# Patient Record
Sex: Male | Born: 1945 | Race: White | Hispanic: No | Marital: Married | Smoking: Never smoker
Health system: Southern US, Community
[De-identification: ages and names within clinical notes are randomized; demographics above are authoritative.]

## PROBLEM LIST (undated history)

## (undated) DIAGNOSIS — K7460 Unspecified cirrhosis of liver: Secondary | ICD-10-CM

## (undated) DIAGNOSIS — I509 Heart failure, unspecified: Secondary | ICD-10-CM

## (undated) DIAGNOSIS — I779 Disorder of arteries and arterioles, unspecified: Secondary | ICD-10-CM

## (undated) DIAGNOSIS — D649 Anemia, unspecified: Secondary | ICD-10-CM

## (undated) DIAGNOSIS — R011 Cardiac murmur, unspecified: Secondary | ICD-10-CM

## (undated) DIAGNOSIS — I4891 Unspecified atrial fibrillation: Secondary | ICD-10-CM

## (undated) DIAGNOSIS — D499 Neoplasm of unspecified behavior of unspecified site: Secondary | ICD-10-CM

## (undated) DIAGNOSIS — IMO0002 Reserved for concepts with insufficient information to code with codable children: Secondary | ICD-10-CM

## (undated) DIAGNOSIS — D689 Coagulation defect, unspecified: Secondary | ICD-10-CM

## (undated) DIAGNOSIS — D126 Benign neoplasm of colon, unspecified: Secondary | ICD-10-CM

## (undated) DIAGNOSIS — K219 Gastro-esophageal reflux disease without esophagitis: Secondary | ICD-10-CM

## (undated) DIAGNOSIS — I10 Essential (primary) hypertension: Secondary | ICD-10-CM

## (undated) DIAGNOSIS — K635 Polyp of colon: Secondary | ICD-10-CM

## (undated) DIAGNOSIS — K5790 Diverticulosis of intestine, part unspecified, without perforation or abscess without bleeding: Secondary | ICD-10-CM

## (undated) DIAGNOSIS — I493 Ventricular premature depolarization: Secondary | ICD-10-CM

## (undated) DIAGNOSIS — M1712 Unilateral primary osteoarthritis, left knee: Secondary | ICD-10-CM

## (undated) DIAGNOSIS — I350 Nonrheumatic aortic (valve) stenosis: Secondary | ICD-10-CM

## (undated) DIAGNOSIS — F419 Anxiety disorder, unspecified: Secondary | ICD-10-CM

## (undated) DIAGNOSIS — N400 Enlarged prostate without lower urinary tract symptoms: Secondary | ICD-10-CM

## (undated) DIAGNOSIS — E785 Hyperlipidemia, unspecified: Secondary | ICD-10-CM

## (undated) DIAGNOSIS — K642 Third degree hemorrhoids: Secondary | ICD-10-CM

## (undated) DIAGNOSIS — I2510 Atherosclerotic heart disease of native coronary artery without angina pectoris: Secondary | ICD-10-CM

## (undated) DIAGNOSIS — J8410 Pulmonary fibrosis, unspecified: Secondary | ICD-10-CM

## (undated) DIAGNOSIS — I739 Peripheral vascular disease, unspecified: Secondary | ICD-10-CM

## (undated) DIAGNOSIS — I4819 Other persistent atrial fibrillation: Secondary | ICD-10-CM

## (undated) DIAGNOSIS — R17 Unspecified jaundice: Secondary | ICD-10-CM

## (undated) DIAGNOSIS — Z87442 Personal history of urinary calculi: Secondary | ICD-10-CM

## (undated) DIAGNOSIS — J302 Other seasonal allergic rhinitis: Secondary | ICD-10-CM

## (undated) DIAGNOSIS — F32A Depression, unspecified: Secondary | ICD-10-CM

## (undated) DIAGNOSIS — T7840XA Allergy, unspecified, initial encounter: Secondary | ICD-10-CM

## (undated) HISTORY — DX: Other persistent atrial fibrillation: I48.19

## (undated) HISTORY — DX: Anemia, unspecified: D64.9

## (undated) HISTORY — DX: Unspecified atrial fibrillation: I48.91

## (undated) HISTORY — DX: Depression, unspecified: F32.A

## (undated) HISTORY — DX: Hyperlipidemia, unspecified: E78.5

## (undated) HISTORY — DX: Gastro-esophageal reflux disease without esophagitis: K21.9

## (undated) HISTORY — DX: Gilbert syndrome: E80.4

## (undated) HISTORY — PX: EYE SURGERY: SHX253

## (undated) HISTORY — DX: Diverticulosis of intestine, part unspecified, without perforation or abscess without bleeding: K57.90

## (undated) HISTORY — DX: Nonrheumatic aortic (valve) stenosis: I35.0

## (undated) HISTORY — DX: Other seasonal allergic rhinitis: J30.2

## (undated) HISTORY — DX: Atherosclerotic heart disease of native coronary artery without angina pectoris: I25.10

## (undated) HISTORY — DX: Unspecified jaundice: R17

## (undated) HISTORY — DX: Disorder of arteries and arterioles, unspecified: I77.9

## (undated) HISTORY — DX: Pulmonary fibrosis, unspecified: J84.10

## (undated) HISTORY — DX: Allergy, unspecified, initial encounter: T78.40XA

## (undated) HISTORY — DX: Reserved for concepts with insufficient information to code with codable children: IMO0002

## (undated) HISTORY — PX: ABLATION: SHX5711

## (undated) HISTORY — DX: Benign prostatic hyperplasia without lower urinary tract symptoms: N40.0

## (undated) HISTORY — DX: Unilateral primary osteoarthritis, left knee: M17.12

## (undated) HISTORY — PX: INSERT / REPLACE / REMOVE PACEMAKER: SUR710

## (undated) HISTORY — DX: Unspecified cirrhosis of liver: K74.60

## (undated) HISTORY — DX: Polyp of colon: K63.5

## (undated) HISTORY — DX: Essential (primary) hypertension: I10

## (undated) HISTORY — PX: SMALL INTESTINE SURGERY: SHX150

## (undated) HISTORY — PX: ESOPHAGOGASTRODUODENOSCOPY: SHX1529

## (undated) HISTORY — PX: HEMORRHOID BANDING: SHX5850

## (undated) HISTORY — PX: WISDOM TOOTH EXTRACTION: SHX21

## (undated) HISTORY — DX: Coagulation defect, unspecified: D68.9

## (undated) HISTORY — PX: COLONOSCOPY: SHX174

## (undated) HISTORY — DX: Peripheral vascular disease, unspecified: I73.9

## (undated) HISTORY — DX: Heart failure, unspecified: I50.9

## (undated) HISTORY — PX: HEAD & NECK SKIN LESION EXCISIONAL BIOPSY: SUR472

## (undated) HISTORY — DX: Ventricular premature depolarization: I49.3

## (undated) HISTORY — DX: Benign neoplasm of colon, unspecified: D12.6

## (undated) HISTORY — DX: Third degree hemorrhoids: K64.2

---
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
## 1998-11-30 HISTORY — PX: CORONARY ARTERY BYPASS GRAFT: SHX141

## 1999-05-08 ENCOUNTER — Ambulatory Visit (HOSPITAL_COMMUNITY): Admission: RE | Admit: 1999-05-08 | Discharge: 1999-05-08 | Payer: Self-pay | Admitting: Internal Medicine

## 1999-05-08 ENCOUNTER — Encounter: Payer: Self-pay | Admitting: Internal Medicine

## 1999-10-03 ENCOUNTER — Inpatient Hospital Stay (HOSPITAL_COMMUNITY): Admission: EM | Admit: 1999-10-03 | Discharge: 1999-10-12 | Payer: Self-pay | Admitting: *Deleted

## 1999-10-05 ENCOUNTER — Encounter: Payer: Self-pay | Admitting: Cardiothoracic Surgery

## 1999-10-06 ENCOUNTER — Encounter: Payer: Self-pay | Admitting: Cardiology

## 1999-10-06 ENCOUNTER — Encounter: Payer: Self-pay | Admitting: Cardiothoracic Surgery

## 1999-10-07 ENCOUNTER — Encounter: Payer: Self-pay | Admitting: Cardiothoracic Surgery

## 1999-10-08 ENCOUNTER — Encounter: Payer: Self-pay | Admitting: Cardiothoracic Surgery

## 1999-10-09 ENCOUNTER — Encounter: Payer: Self-pay | Admitting: Cardiothoracic Surgery

## 2001-11-11 ENCOUNTER — Ambulatory Visit (HOSPITAL_COMMUNITY): Admission: RE | Admit: 2001-11-11 | Discharge: 2001-11-11 | Payer: Self-pay | Admitting: Cardiology

## 2002-04-20 ENCOUNTER — Ambulatory Visit (HOSPITAL_COMMUNITY): Admission: RE | Admit: 2002-04-20 | Discharge: 2002-04-20 | Payer: Self-pay | Admitting: Gastroenterology

## 2002-04-20 ENCOUNTER — Encounter: Payer: Self-pay | Admitting: Gastroenterology

## 2002-04-20 ENCOUNTER — Encounter (INDEPENDENT_AMBULATORY_CARE_PROVIDER_SITE_OTHER): Payer: Self-pay | Admitting: Specialist

## 2002-08-25 ENCOUNTER — Ambulatory Visit (HOSPITAL_COMMUNITY): Admission: RE | Admit: 2002-08-25 | Discharge: 2002-08-26 | Payer: Self-pay | Admitting: Internal Medicine

## 2002-08-25 ENCOUNTER — Encounter: Payer: Self-pay | Admitting: Internal Medicine

## 2003-08-30 ENCOUNTER — Encounter: Payer: Self-pay | Admitting: Internal Medicine

## 2003-08-30 ENCOUNTER — Ambulatory Visit (HOSPITAL_COMMUNITY): Admission: RE | Admit: 2003-08-30 | Discharge: 2003-08-30 | Payer: Self-pay | Admitting: Internal Medicine

## 2004-05-19 ENCOUNTER — Encounter (INDEPENDENT_AMBULATORY_CARE_PROVIDER_SITE_OTHER): Payer: Self-pay | Admitting: Gastroenterology

## 2004-10-27 ENCOUNTER — Ambulatory Visit: Payer: Self-pay | Admitting: Internal Medicine

## 2005-03-05 ENCOUNTER — Ambulatory Visit: Payer: Self-pay | Admitting: Cardiology

## 2005-03-17 ENCOUNTER — Ambulatory Visit: Payer: Self-pay

## 2005-04-06 ENCOUNTER — Ambulatory Visit: Payer: Self-pay | Admitting: Gastroenterology

## 2005-04-10 ENCOUNTER — Ambulatory Visit: Payer: Self-pay | Admitting: Gastroenterology

## 2005-07-22 ENCOUNTER — Ambulatory Visit: Payer: Self-pay | Admitting: Cardiology

## 2005-08-10 ENCOUNTER — Ambulatory Visit: Payer: Self-pay | Admitting: Cardiology

## 2005-08-27 ENCOUNTER — Ambulatory Visit: Payer: Self-pay | Admitting: Internal Medicine

## 2005-09-04 ENCOUNTER — Ambulatory Visit: Payer: Self-pay | Admitting: Internal Medicine

## 2005-09-11 ENCOUNTER — Ambulatory Visit: Payer: Self-pay | Admitting: Cardiology

## 2005-09-24 ENCOUNTER — Ambulatory Visit: Payer: Self-pay | Admitting: Cardiology

## 2005-10-09 ENCOUNTER — Ambulatory Visit: Payer: Self-pay | Admitting: Cardiology

## 2005-10-30 ENCOUNTER — Ambulatory Visit: Payer: Self-pay | Admitting: Cardiovascular Disease

## 2005-11-11 ENCOUNTER — Ambulatory Visit: Payer: Self-pay | Admitting: Internal Medicine

## 2005-11-27 ENCOUNTER — Ambulatory Visit: Payer: Self-pay | Admitting: Cardiology

## 2005-12-10 ENCOUNTER — Ambulatory Visit: Payer: Self-pay | Admitting: Internal Medicine

## 2005-12-17 ENCOUNTER — Ambulatory Visit: Payer: Self-pay | Admitting: Cardiology

## 2005-12-21 ENCOUNTER — Ambulatory Visit: Payer: Self-pay | Admitting: Internal Medicine

## 2006-01-14 ENCOUNTER — Ambulatory Visit: Payer: Self-pay | Admitting: Cardiology

## 2006-02-04 ENCOUNTER — Ambulatory Visit: Payer: Self-pay | Admitting: Cardiology

## 2006-03-04 ENCOUNTER — Ambulatory Visit: Payer: Self-pay | Admitting: Cardiology

## 2006-03-25 ENCOUNTER — Ambulatory Visit: Payer: Self-pay | Admitting: *Deleted

## 2006-04-12 ENCOUNTER — Ambulatory Visit: Payer: Self-pay | Admitting: Gastroenterology

## 2006-04-16 ENCOUNTER — Ambulatory Visit: Payer: Self-pay | Admitting: Cardiology

## 2006-04-22 ENCOUNTER — Ambulatory Visit: Payer: Self-pay | Admitting: Cardiology

## 2006-05-07 ENCOUNTER — Ambulatory Visit: Payer: Self-pay | Admitting: Gastroenterology

## 2006-05-19 ENCOUNTER — Ambulatory Visit: Payer: Self-pay

## 2006-05-19 ENCOUNTER — Encounter: Payer: Self-pay | Admitting: Cardiology

## 2006-05-20 ENCOUNTER — Ambulatory Visit: Payer: Self-pay | Admitting: Cardiology

## 2006-05-27 ENCOUNTER — Encounter (INDEPENDENT_AMBULATORY_CARE_PROVIDER_SITE_OTHER): Payer: Self-pay | Admitting: Gastroenterology

## 2006-05-27 ENCOUNTER — Ambulatory Visit: Payer: Self-pay | Admitting: Gastroenterology

## 2006-06-08 ENCOUNTER — Ambulatory Visit: Payer: Self-pay | Admitting: *Deleted

## 2006-06-10 ENCOUNTER — Ambulatory Visit: Payer: Self-pay | Admitting: Internal Medicine

## 2006-06-29 ENCOUNTER — Ambulatory Visit: Payer: Self-pay | Admitting: Cardiology

## 2006-07-29 ENCOUNTER — Ambulatory Visit: Payer: Self-pay | Admitting: Cardiology

## 2006-08-26 ENCOUNTER — Ambulatory Visit: Payer: Self-pay | Admitting: Cardiology

## 2006-09-23 ENCOUNTER — Ambulatory Visit: Payer: Self-pay | Admitting: Cardiology

## 2006-10-07 ENCOUNTER — Ambulatory Visit: Payer: Self-pay | Admitting: Cardiology

## 2006-11-12 ENCOUNTER — Ambulatory Visit: Payer: Self-pay | Admitting: Internal Medicine

## 2006-11-19 ENCOUNTER — Ambulatory Visit: Payer: Self-pay | Admitting: Internal Medicine

## 2006-11-19 LAB — CONVERTED CEMR LAB
Bacteria, U Microscopic: NEGATIVE /hpf
Bilirubin Urine: NEGATIVE
Crystals: NEGATIVE
Epithelial cells, urine: NEGATIVE /lpf
Hemoglobin, Urine: NEGATIVE
Ketones, ur: NEGATIVE mg/dL
Leukocytes, UA: NEGATIVE
Nitrite: NEGATIVE
Specific Gravity, Urine: 1.025 (ref 1.000–1.03)
Total Protein, Urine: NEGATIVE mg/dL
Urine Glucose: NEGATIVE mg/dL
Urobilinogen, UA: 1 (ref 0.0–1.0)
pH: 6 (ref 5.0–8.0)

## 2006-11-25 ENCOUNTER — Ambulatory Visit: Payer: Self-pay | Admitting: Internal Medicine

## 2006-12-10 ENCOUNTER — Ambulatory Visit: Payer: Self-pay | Admitting: Internal Medicine

## 2006-12-16 ENCOUNTER — Ambulatory Visit: Payer: Self-pay | Admitting: Internal Medicine

## 2006-12-22 ENCOUNTER — Ambulatory Visit: Payer: Self-pay | Admitting: Gastroenterology

## 2006-12-24 ENCOUNTER — Ambulatory Visit: Payer: Self-pay | Admitting: Internal Medicine

## 2006-12-31 ENCOUNTER — Ambulatory Visit: Payer: Self-pay | Admitting: Internal Medicine

## 2007-01-03 ENCOUNTER — Ambulatory Visit: Payer: Self-pay

## 2007-01-03 ENCOUNTER — Ambulatory Visit: Payer: Self-pay | Admitting: Internal Medicine

## 2007-01-03 LAB — CONVERTED CEMR LAB
ALT: 29 units/L (ref 0–40)
AST: 33 units/L (ref 0–37)
Albumin: 3.7 g/dL (ref 3.5–5.2)
Alkaline Phosphatase: 64 units/L (ref 39–117)
BUN: 19 mg/dL (ref 6–23)
Basophils Absolute: 0 10*3/uL (ref 0.0–0.1)
Basophils Relative: 0.3 % (ref 0.0–1.0)
Bilirubin Urine: NEGATIVE
Bilirubin, Direct: 0.3 mg/dL (ref 0.0–0.3)
CO2: 32 meq/L (ref 19–32)
Calcium: 9 mg/dL (ref 8.4–10.5)
Chloride: 108 meq/L (ref 96–112)
Cholesterol: 117 mg/dL (ref 0–200)
Creatinine, Ser: 1 mg/dL (ref 0.4–1.5)
Eosinophils Absolute: 0.1 10*3/uL (ref 0.0–0.6)
Eosinophils Relative: 1.5 % (ref 0.0–5.0)
GFR calc Af Amer: 99 mL/min
GFR calc non Af Amer: 82 mL/min
Glucose, Bld: 107 mg/dL — ABNORMAL HIGH (ref 70–99)
HCT: 45.5 % (ref 39.0–52.0)
HDL: 34.5 mg/dL — ABNORMAL LOW (ref >39.0)
Hemoglobin, Urine: NEGATIVE
Hemoglobin: 15.8 g/dL (ref 13.0–17.0)
Ketones, ur: NEGATIVE mg/dL
LDL Cholesterol: 72 mg/dL (ref 0–99)
Leukocytes, UA: NEGATIVE
Lymphocytes Relative: 25.8 % (ref 12.0–46.0)
MCHC: 34.6 g/dL (ref 30.0–36.0)
MCV: 86.5 fL (ref 78.0–100.0)
Monocytes Absolute: 1 10*3/uL — ABNORMAL HIGH (ref 0.2–0.7)
Monocytes Relative: 15.6 % — ABNORMAL HIGH (ref 3.0–11.0)
Neutro Abs: 3.4 10*3/uL (ref 1.4–7.7)
Neutrophils Relative %: 56.8 % (ref 43.0–77.0)
Nitrite: NEGATIVE
PSA: 0.81 ng/mL (ref 0.10–4.00)
Platelets: 202 10*3/uL (ref 150–400)
Potassium: 4.5 meq/L (ref 3.5–5.1)
RBC: 5.26 M/uL (ref 4.22–5.81)
RDW: 12.3 % (ref 11.5–14.6)
Sodium: 144 meq/L (ref 135–145)
Specific Gravity, Urine: >=1.03 (ref 1.000–1.03)
TSH: 3.4 microintl units/mL (ref 0.35–5.50)
Total Bilirubin: 1.4 mg/dL — ABNORMAL HIGH (ref 0.3–1.2)
Total CHOL/HDL Ratio: 3.4
Total Protein, Urine: NEGATIVE mg/dL
Total Protein: 6.5 g/dL (ref 6.0–8.3)
Triglycerides: 51 mg/dL (ref 0–149)
Urine Glucose: NEGATIVE mg/dL
Urobilinogen, UA: 0.2 (ref 0.0–1.0)
VLDL: 10 mg/dL (ref 0–40)
WBC: 6.1 10*3/uL (ref 4.5–10.5)
pH: 6 (ref 5.0–8.0)

## 2007-01-10 ENCOUNTER — Ambulatory Visit: Payer: Self-pay | Admitting: Cardiology

## 2007-01-10 ENCOUNTER — Ambulatory Visit: Payer: Self-pay | Admitting: Internal Medicine

## 2007-01-20 ENCOUNTER — Ambulatory Visit: Payer: Self-pay | Admitting: Internal Medicine

## 2007-01-20 LAB — CONVERTED CEMR LAB
Glucose, 1 Hour GTT: 159 mg/dL (ref 120–170)
Glucose, 2 hour: 117 mg/dL (ref 70–139)
Glucose, Fasting: 101 mg/dL — ABNORMAL HIGH (ref 70–99)

## 2007-01-28 ENCOUNTER — Ambulatory Visit: Payer: Self-pay | Admitting: Gastroenterology

## 2007-01-31 ENCOUNTER — Ambulatory Visit: Payer: Self-pay | Admitting: Cardiovascular Disease

## 2007-03-01 ENCOUNTER — Ambulatory Visit: Payer: Self-pay | Admitting: *Deleted

## 2007-03-21 ENCOUNTER — Ambulatory Visit: Payer: Self-pay | Admitting: Cardiology

## 2007-03-28 ENCOUNTER — Ambulatory Visit: Payer: Self-pay | Admitting: Cardiology

## 2007-04-08 ENCOUNTER — Ambulatory Visit: Payer: Self-pay | Admitting: Internal Medicine

## 2007-04-22 ENCOUNTER — Ambulatory Visit: Payer: Self-pay | Admitting: Cardiovascular Disease

## 2007-05-20 ENCOUNTER — Ambulatory Visit: Payer: Self-pay | Admitting: Cardiology

## 2007-06-17 ENCOUNTER — Ambulatory Visit: Payer: Self-pay | Admitting: Cardiovascular Disease

## 2007-07-11 ENCOUNTER — Ambulatory Visit: Payer: Self-pay | Admitting: Gastroenterology

## 2007-07-15 ENCOUNTER — Ambulatory Visit: Payer: Self-pay | Admitting: Internal Medicine

## 2007-08-09 ENCOUNTER — Encounter: Payer: Self-pay | Admitting: Internal Medicine

## 2007-08-09 DIAGNOSIS — Z86010 Personal history of colonic polyps: Secondary | ICD-10-CM

## 2007-08-11 ENCOUNTER — Ambulatory Visit: Payer: Self-pay | Admitting: Internal Medicine

## 2007-08-25 ENCOUNTER — Ambulatory Visit: Payer: Self-pay | Admitting: Internal Medicine

## 2007-08-25 ENCOUNTER — Encounter: Payer: Self-pay | Admitting: Internal Medicine

## 2007-09-12 ENCOUNTER — Ambulatory Visit: Payer: Self-pay | Admitting: Internal Medicine

## 2007-10-04 ENCOUNTER — Ambulatory Visit: Payer: Self-pay | Admitting: Cardiology

## 2007-10-10 ENCOUNTER — Ambulatory Visit: Payer: Self-pay | Admitting: Internal Medicine

## 2007-10-13 ENCOUNTER — Ambulatory Visit: Payer: Self-pay

## 2007-10-17 ENCOUNTER — Ambulatory Visit: Payer: Self-pay | Admitting: Cardiology

## 2007-10-17 LAB — CONVERTED CEMR LAB
Cholesterol: 107 mg/dL (ref 0–200)
HDL: 29.2 mg/dL — ABNORMAL LOW (ref >39.0)
LDL Cholesterol: 68 mg/dL (ref 0–99)
Total CHOL/HDL Ratio: 3.7
Triglycerides: 49 mg/dL (ref 0–149)
VLDL: 10 mg/dL (ref 0–40)

## 2007-11-04 ENCOUNTER — Ambulatory Visit: Payer: Self-pay | Admitting: Sports Medicine

## 2007-11-04 DIAGNOSIS — M1710 Unilateral primary osteoarthritis, unspecified knee: Secondary | ICD-10-CM | POA: Insufficient documentation

## 2007-11-04 DIAGNOSIS — M79609 Pain in unspecified limb: Secondary | ICD-10-CM | POA: Insufficient documentation

## 2007-11-07 ENCOUNTER — Ambulatory Visit: Payer: Self-pay | Admitting: Internal Medicine

## 2007-11-22 ENCOUNTER — Ambulatory Visit: Payer: Self-pay | Admitting: Internal Medicine

## 2007-12-05 ENCOUNTER — Ambulatory Visit: Payer: Self-pay | Admitting: Internal Medicine

## 2007-12-16 ENCOUNTER — Ambulatory Visit: Payer: Self-pay

## 2007-12-16 ENCOUNTER — Encounter: Payer: Self-pay | Admitting: Internal Medicine

## 2008-01-02 ENCOUNTER — Ambulatory Visit: Payer: Self-pay | Admitting: Internal Medicine

## 2008-02-03 ENCOUNTER — Ambulatory Visit: Payer: Self-pay | Admitting: Cardiology

## 2008-03-02 ENCOUNTER — Ambulatory Visit: Payer: Self-pay | Admitting: Cardiology

## 2008-03-06 DIAGNOSIS — F341 Dysthymic disorder: Secondary | ICD-10-CM | POA: Insufficient documentation

## 2008-03-06 DIAGNOSIS — D126 Benign neoplasm of colon, unspecified: Secondary | ICD-10-CM

## 2008-03-06 DIAGNOSIS — K449 Diaphragmatic hernia without obstruction or gangrene: Secondary | ICD-10-CM | POA: Insufficient documentation

## 2008-03-06 DIAGNOSIS — K2980 Duodenitis without bleeding: Secondary | ICD-10-CM | POA: Insufficient documentation

## 2008-03-06 DIAGNOSIS — K222 Esophageal obstruction: Secondary | ICD-10-CM | POA: Insufficient documentation

## 2008-03-06 DIAGNOSIS — K2940 Chronic atrophic gastritis without bleeding: Secondary | ICD-10-CM | POA: Insufficient documentation

## 2008-03-06 DIAGNOSIS — K2270 Barrett's esophagus without dysplasia: Secondary | ICD-10-CM

## 2008-03-06 DIAGNOSIS — K649 Unspecified hemorrhoids: Secondary | ICD-10-CM | POA: Insufficient documentation

## 2008-03-06 DIAGNOSIS — Z87448 Personal history of other diseases of urinary system: Secondary | ICD-10-CM | POA: Insufficient documentation

## 2008-03-14 ENCOUNTER — Ambulatory Visit: Payer: Self-pay | Admitting: Internal Medicine

## 2008-03-14 LAB — CONVERTED CEMR LAB
ALT: 30 units/L (ref 0–53)
AST: 31 units/L (ref 0–37)
Albumin: 3.6 g/dL (ref 3.5–5.2)
Alkaline Phosphatase: 62 units/L (ref 39–117)
BUN: 13 mg/dL (ref 6–23)
Basophils Absolute: 0 10*3/uL (ref 0.0–0.1)
Basophils Relative: 0.1 % (ref 0.0–1.0)
Bilirubin Urine: NEGATIVE
Bilirubin, Direct: 0.3 mg/dL (ref 0.0–0.3)
CO2: 32 meq/L (ref 19–32)
Calcium: 9.1 mg/dL (ref 8.4–10.5)
Chloride: 107 meq/L (ref 96–112)
Cholesterol: 113 mg/dL (ref 0–200)
Creatinine, Ser: 1.1 mg/dL (ref 0.4–1.5)
Eosinophils Absolute: 0.1 10*3/uL (ref 0.0–0.7)
Eosinophils Relative: 1.5 % (ref 0.0–5.0)
GFR calc Af Amer: 88 mL/min
GFR calc non Af Amer: 73 mL/min
Glucose, Bld: 102 mg/dL — ABNORMAL HIGH (ref 70–99)
HCT: 44.5 % (ref 39.0–52.0)
HDL: 27.9 mg/dL — ABNORMAL LOW (ref >39.0)
Hemoglobin, Urine: NEGATIVE
Hemoglobin: 15.4 g/dL (ref 13.0–17.0)
Ketones, ur: NEGATIVE mg/dL
LDL Cholesterol: 75 mg/dL (ref 0–99)
Leukocytes, UA: NEGATIVE
Lymphocytes Relative: 24.4 % (ref 12.0–46.0)
MCHC: 34.5 g/dL (ref 30.0–36.0)
MCV: 87.5 fL (ref 78.0–100.0)
Monocytes Absolute: 1 10*3/uL (ref 0.1–1.0)
Monocytes Relative: 17.4 % — ABNORMAL HIGH (ref 3.0–12.0)
Neutro Abs: 3.1 10*3/uL (ref 1.4–7.7)
Neutrophils Relative %: 56.6 % (ref 43.0–77.0)
Nitrite: NEGATIVE
PSA: 0.96 ng/mL (ref 0.10–4.00)
Platelets: 214 10*3/uL (ref 150–400)
Potassium: 4.5 meq/L (ref 3.5–5.1)
RBC: 5.08 M/uL (ref 4.22–5.81)
RDW: 12.5 % (ref 11.5–14.6)
Sodium: 141 meq/L (ref 135–145)
Specific Gravity, Urine: 1.015 (ref 1.000–1.03)
TSH: 3.74 microintl units/mL (ref 0.35–5.50)
Total Bilirubin: 1.5 mg/dL — ABNORMAL HIGH (ref 0.3–1.2)
Total CHOL/HDL Ratio: 4.1
Total Protein, Urine: NEGATIVE mg/dL
Total Protein: 6.4 g/dL (ref 6.0–8.3)
Triglycerides: 52 mg/dL (ref 0–149)
Urine Glucose: NEGATIVE mg/dL
Urobilinogen, UA: 0.2 (ref 0.0–1.0)
VLDL: 10 mg/dL (ref 0–40)
WBC: 5.5 10*3/uL (ref 4.5–10.5)
pH: 7.5 (ref 5.0–8.0)

## 2008-03-21 ENCOUNTER — Ambulatory Visit: Payer: Self-pay | Admitting: Internal Medicine

## 2008-03-30 ENCOUNTER — Ambulatory Visit: Payer: Self-pay | Admitting: Cardiology

## 2008-04-27 ENCOUNTER — Ambulatory Visit: Payer: Self-pay | Admitting: Gastroenterology

## 2008-04-27 ENCOUNTER — Encounter: Payer: Self-pay | Admitting: Gastroenterology

## 2008-04-30 ENCOUNTER — Encounter: Payer: Self-pay | Admitting: Gastroenterology

## 2008-05-04 ENCOUNTER — Ambulatory Visit: Payer: Self-pay | Admitting: Internal Medicine

## 2008-05-25 ENCOUNTER — Ambulatory Visit: Payer: Self-pay | Admitting: Cardiology

## 2008-06-18 ENCOUNTER — Ambulatory Visit: Payer: Self-pay | Admitting: Cardiology

## 2008-06-27 ENCOUNTER — Telehealth: Payer: Self-pay | Admitting: Gastroenterology

## 2008-07-06 ENCOUNTER — Ambulatory Visit: Payer: Self-pay | Admitting: Cardiology

## 2008-07-17 ENCOUNTER — Ambulatory Visit: Payer: Self-pay | Admitting: Cardiology

## 2008-08-14 ENCOUNTER — Ambulatory Visit: Payer: Self-pay | Admitting: Cardiology

## 2008-08-16 ENCOUNTER — Ambulatory Visit: Payer: Self-pay | Admitting: Cardiology

## 2008-09-11 ENCOUNTER — Ambulatory Visit: Payer: Self-pay | Admitting: Internal Medicine

## 2008-10-09 ENCOUNTER — Ambulatory Visit: Payer: Self-pay | Admitting: Cardiology

## 2008-10-30 ENCOUNTER — Ambulatory Visit: Payer: Self-pay | Admitting: Cardiovascular Disease

## 2008-11-13 ENCOUNTER — Ambulatory Visit: Payer: Self-pay | Admitting: Cardiology

## 2008-11-27 ENCOUNTER — Ambulatory Visit: Payer: Self-pay | Admitting: Cardiology

## 2008-11-27 ENCOUNTER — Ambulatory Visit: Payer: Self-pay | Admitting: Internal Medicine

## 2008-11-27 LAB — CONVERTED CEMR LAB
ALT: 23 units/L (ref 0–53)
AST: 25 units/L (ref 0–37)
Albumin: 3.5 g/dL (ref 3.5–5.2)
Alkaline Phosphatase: 57 units/L (ref 39–117)
Bilirubin, Direct: 0.3 mg/dL (ref 0.0–0.3)
Cholesterol: 117 mg/dL (ref 0–200)
HDL: 28.5 mg/dL — ABNORMAL LOW (ref >39.0)
LDL Cholesterol: 78 mg/dL (ref 0–99)
Total Bilirubin: 1.9 mg/dL — ABNORMAL HIGH (ref 0.3–1.2)
Total CHOL/HDL Ratio: 4.1
Total Protein: 6.2 g/dL (ref 6.0–8.3)
Triglycerides: 54 mg/dL (ref 0–149)
VLDL: 11 mg/dL (ref 0–40)

## 2008-12-10 ENCOUNTER — Ambulatory Visit: Payer: Self-pay

## 2008-12-17 ENCOUNTER — Telehealth (INDEPENDENT_AMBULATORY_CARE_PROVIDER_SITE_OTHER): Payer: Self-pay | Admitting: *Deleted

## 2008-12-18 ENCOUNTER — Encounter: Payer: Self-pay | Admitting: Gastroenterology

## 2008-12-19 ENCOUNTER — Ambulatory Visit: Payer: Self-pay | Admitting: Internal Medicine

## 2009-01-16 ENCOUNTER — Ambulatory Visit: Payer: Self-pay | Admitting: Cardiology

## 2009-01-16 LAB — CONVERTED CEMR LAB
ALT: 24 units/L (ref 0–53)
AST: 26 units/L (ref 0–37)
Albumin: 3.4 g/dL — ABNORMAL LOW (ref 3.5–5.2)
Alkaline Phosphatase: 56 units/L (ref 39–117)
Bilirubin, Direct: 0.3 mg/dL (ref 0.0–0.3)
Total Bilirubin: 1.6 mg/dL — ABNORMAL HIGH (ref 0.3–1.2)
Total CK: 86 units/L (ref 7–195)
Total Protein: 6 g/dL (ref 6.0–8.3)

## 2009-01-30 ENCOUNTER — Ambulatory Visit: Payer: Self-pay | Admitting: Internal Medicine

## 2009-02-13 ENCOUNTER — Ambulatory Visit: Payer: Self-pay | Admitting: Cardiology

## 2009-03-07 ENCOUNTER — Ambulatory Visit: Payer: Self-pay | Admitting: Cardiology

## 2009-03-21 ENCOUNTER — Ambulatory Visit: Payer: Self-pay | Admitting: Internal Medicine

## 2009-03-21 LAB — CONVERTED CEMR LAB
ALT: 27 units/L (ref 0–53)
AST: 33 units/L (ref 0–37)
Albumin: 3.6 g/dL (ref 3.5–5.2)
Alkaline Phosphatase: 64 units/L (ref 39–117)
BUN: 18 mg/dL (ref 6–23)
Basophils Absolute: 0.1 10*3/uL (ref 0.0–0.1)
Basophils Relative: 0.9 % (ref 0.0–3.0)
Bilirubin Urine: NEGATIVE
Bilirubin, Direct: 0.3 mg/dL (ref 0.0–0.3)
CO2: 28 meq/L (ref 19–32)
Calcium: 9 mg/dL (ref 8.4–10.5)
Chloride: 108 meq/L (ref 96–112)
Cholesterol: 120 mg/dL (ref 0–200)
Creatinine, Ser: 1.1 mg/dL (ref 0.4–1.5)
Eosinophils Absolute: 0.1 10*3/uL (ref 0.0–0.7)
Eosinophils Relative: 1.8 % (ref 0.0–5.0)
GFR calc non Af Amer: 72.29 mL/min (ref >60)
Glucose, Bld: 107 mg/dL — ABNORMAL HIGH (ref 70–99)
HCT: 44.9 % (ref 39.0–52.0)
HDL: 29.6 mg/dL — ABNORMAL LOW (ref >39.00)
Hemoglobin, Urine: NEGATIVE
Hemoglobin: 15.1 g/dL (ref 13.0–17.0)
Ketones, ur: NEGATIVE mg/dL
LDL Cholesterol: 81 mg/dL (ref 0–99)
Leukocytes, UA: NEGATIVE
Lymphocytes Relative: 30.6 % (ref 12.0–46.0)
Lymphs Abs: 1.8 10*3/uL (ref 0.7–4.0)
MCHC: 33.7 g/dL (ref 30.0–36.0)
MCV: 89.1 fL (ref 78.0–100.0)
Monocytes Absolute: 0.9 10*3/uL (ref 0.1–1.0)
Monocytes Relative: 15.2 % — ABNORMAL HIGH (ref 3.0–12.0)
Neutro Abs: 3 10*3/uL (ref 1.4–7.7)
Neutrophils Relative %: 51.5 % (ref 43.0–77.0)
Nitrite: NEGATIVE
PSA: 0.95 ng/mL (ref 0.10–4.00)
Platelets: 163 10*3/uL (ref 150.0–400.0)
Potassium: 4.2 meq/L (ref 3.5–5.1)
RBC: 5.04 M/uL (ref 4.22–5.81)
RDW: 13.1 % (ref 11.5–14.6)
Sodium: 140 meq/L (ref 135–145)
Specific Gravity, Urine: 1.015 (ref 1.000–1.030)
TSH: 4.67 microintl units/mL (ref 0.35–5.50)
Total Bilirubin: 2.3 mg/dL — ABNORMAL HIGH (ref 0.3–1.2)
Total CHOL/HDL Ratio: 4
Total Protein, Urine: NEGATIVE mg/dL
Total Protein: 6.2 g/dL (ref 6.0–8.3)
Triglycerides: 48 mg/dL (ref 0.0–149.0)
Urine Glucose: NEGATIVE mg/dL
Urobilinogen, UA: 1 (ref 0.0–1.0)
VLDL: 9.6 mg/dL (ref 0.0–40.0)
WBC: 5.9 10*3/uL (ref 4.5–10.5)
pH: 7 (ref 5.0–8.0)

## 2009-03-28 ENCOUNTER — Ambulatory Visit: Payer: Self-pay | Admitting: Internal Medicine

## 2009-04-23 ENCOUNTER — Encounter: Payer: Self-pay | Admitting: Cardiology

## 2009-04-23 ENCOUNTER — Ambulatory Visit: Payer: Self-pay | Admitting: Cardiology

## 2009-04-24 ENCOUNTER — Telehealth: Payer: Self-pay | Admitting: Internal Medicine

## 2009-04-30 ENCOUNTER — Encounter: Payer: Self-pay | Admitting: *Deleted

## 2009-05-02 ENCOUNTER — Ambulatory Visit: Payer: Self-pay | Admitting: Sports Medicine

## 2009-05-02 DIAGNOSIS — Q663 Other congenital varus deformities of feet, unspecified foot: Secondary | ICD-10-CM

## 2009-05-15 ENCOUNTER — Encounter: Payer: Self-pay | Admitting: Internal Medicine

## 2009-05-21 ENCOUNTER — Ambulatory Visit: Payer: Self-pay | Admitting: Cardiovascular Disease

## 2009-05-21 LAB — CONVERTED CEMR LAB
POC INR: 2.4
Protime: 18.8

## 2009-06-05 ENCOUNTER — Encounter: Payer: Self-pay | Admitting: *Deleted

## 2009-06-25 ENCOUNTER — Ambulatory Visit: Payer: Self-pay | Admitting: Cardiology

## 2009-06-25 LAB — CONVERTED CEMR LAB: POC INR: 2.3

## 2009-06-26 ENCOUNTER — Ambulatory Visit: Payer: Self-pay | Admitting: Sports Medicine

## 2009-07-23 ENCOUNTER — Ambulatory Visit: Payer: Self-pay | Admitting: Cardiovascular Disease

## 2009-07-29 ENCOUNTER — Encounter: Payer: Self-pay | Admitting: Cardiology

## 2009-07-29 DIAGNOSIS — I4949 Other premature depolarization: Secondary | ICD-10-CM

## 2009-07-30 ENCOUNTER — Ambulatory Visit: Payer: Self-pay | Admitting: Cardiology

## 2009-08-19 ENCOUNTER — Encounter: Payer: Self-pay | Admitting: Gastroenterology

## 2009-08-22 ENCOUNTER — Ambulatory Visit: Payer: Self-pay | Admitting: Sports Medicine

## 2009-08-22 DIAGNOSIS — M67919 Unspecified disorder of synovium and tendon, unspecified shoulder: Secondary | ICD-10-CM | POA: Insufficient documentation

## 2009-08-22 DIAGNOSIS — M719 Bursopathy, unspecified: Secondary | ICD-10-CM

## 2009-08-23 ENCOUNTER — Encounter: Payer: Self-pay | Admitting: Cardiology

## 2009-08-23 ENCOUNTER — Ambulatory Visit: Payer: Self-pay | Admitting: Cardiology

## 2009-08-23 ENCOUNTER — Ambulatory Visit: Payer: Self-pay

## 2009-08-23 ENCOUNTER — Ambulatory Visit: Payer: Self-pay | Admitting: Internal Medicine

## 2009-08-23 LAB — CONVERTED CEMR LAB: POC INR: 1.6

## 2009-09-09 ENCOUNTER — Ambulatory Visit: Payer: Self-pay | Admitting: Internal Medicine

## 2009-09-09 LAB — CONVERTED CEMR LAB: POC INR: 1.9

## 2009-09-13 ENCOUNTER — Encounter: Payer: Self-pay | Admitting: Cardiology

## 2009-09-19 ENCOUNTER — Ambulatory Visit: Payer: Self-pay | Admitting: Internal Medicine

## 2009-09-19 LAB — CONVERTED CEMR LAB
Glucose, Urine, Semiquant: NEGATIVE
Ketones, urine, test strip: NEGATIVE
Nitrite: NEGATIVE
Urobilinogen, UA: 0.2
WBC Urine, dipstick: NEGATIVE
pH: 5

## 2009-09-30 ENCOUNTER — Ambulatory Visit: Payer: Self-pay | Admitting: Internal Medicine

## 2009-09-30 DIAGNOSIS — N401 Enlarged prostate with lower urinary tract symptoms: Secondary | ICD-10-CM | POA: Insufficient documentation

## 2009-09-30 LAB — CONVERTED CEMR LAB
Bilirubin Urine: NEGATIVE
Nitrite: NEGATIVE
POC INR: 2.4
Urobilinogen, UA: 0.2 (ref 0.0–1.0)

## 2009-10-01 ENCOUNTER — Telehealth: Payer: Self-pay | Admitting: Internal Medicine

## 2009-10-01 ENCOUNTER — Telehealth (INDEPENDENT_AMBULATORY_CARE_PROVIDER_SITE_OTHER): Payer: Self-pay | Admitting: *Deleted

## 2009-10-07 ENCOUNTER — Ambulatory Visit: Payer: Self-pay | Admitting: Internal Medicine

## 2009-10-07 LAB — CONVERTED CEMR LAB: POC INR: 2.6

## 2009-10-09 ENCOUNTER — Encounter: Payer: Self-pay | Admitting: Cardiology

## 2009-10-10 ENCOUNTER — Encounter: Payer: Self-pay | Admitting: Cardiology

## 2009-10-10 ENCOUNTER — Ambulatory Visit: Payer: Self-pay

## 2009-10-11 ENCOUNTER — Encounter: Payer: Self-pay | Admitting: Cardiology

## 2009-10-28 ENCOUNTER — Ambulatory Visit: Payer: Self-pay | Admitting: Cardiology

## 2009-11-21 ENCOUNTER — Ambulatory Visit: Payer: Self-pay | Admitting: Internal Medicine

## 2009-11-28 ENCOUNTER — Encounter (INDEPENDENT_AMBULATORY_CARE_PROVIDER_SITE_OTHER): Payer: Self-pay | Admitting: *Deleted

## 2009-12-03 ENCOUNTER — Ambulatory Visit: Payer: Self-pay | Admitting: Sports Medicine

## 2009-12-03 DIAGNOSIS — M25561 Pain in right knee: Secondary | ICD-10-CM | POA: Insufficient documentation

## 2009-12-03 DIAGNOSIS — M23302 Other meniscus derangements, unspecified lateral meniscus, unspecified knee: Secondary | ICD-10-CM | POA: Insufficient documentation

## 2009-12-03 DIAGNOSIS — M25562 Pain in left knee: Secondary | ICD-10-CM

## 2009-12-19 ENCOUNTER — Ambulatory Visit: Payer: Self-pay | Admitting: Cardiology

## 2009-12-19 ENCOUNTER — Ambulatory Visit: Payer: Self-pay | Admitting: Cardiovascular Disease

## 2009-12-19 ENCOUNTER — Encounter (INDEPENDENT_AMBULATORY_CARE_PROVIDER_SITE_OTHER): Payer: Self-pay | Admitting: Cardiology

## 2009-12-19 LAB — CONVERTED CEMR LAB: POC INR: 2.6

## 2009-12-20 ENCOUNTER — Encounter: Payer: Self-pay | Admitting: Cardiology

## 2009-12-23 LAB — CONVERTED CEMR LAB
ALT: 27 U/L
AST: 29 U/L
Albumin: 3.8 g/dL
Alkaline Phosphatase: 58 U/L
Bilirubin, Direct: 0.1 mg/dL
Cholesterol: 125 mg/dL
HDL: 31.9 mg/dL — ABNORMAL LOW
LDL Cholesterol: 78 mg/dL
Total Bilirubin: 2 mg/dL — ABNORMAL HIGH
Total CHOL/HDL Ratio: 4
Total Protein: 6.4 g/dL
Triglycerides: 77 mg/dL
VLDL: 15.4 mg/dL

## 2010-01-05 ENCOUNTER — Encounter: Payer: Self-pay | Admitting: Cardiology

## 2010-01-06 ENCOUNTER — Ambulatory Visit: Payer: Self-pay | Admitting: Cardiology

## 2010-01-16 ENCOUNTER — Ambulatory Visit: Payer: Self-pay | Admitting: Cardiology

## 2010-02-12 ENCOUNTER — Telehealth (INDEPENDENT_AMBULATORY_CARE_PROVIDER_SITE_OTHER): Payer: Self-pay | Admitting: *Deleted

## 2010-02-13 ENCOUNTER — Encounter: Payer: Self-pay | Admitting: Cardiology

## 2010-02-13 ENCOUNTER — Encounter (HOSPITAL_COMMUNITY): Admission: RE | Admit: 2010-02-13 | Discharge: 2010-04-02 | Payer: Self-pay | Admitting: Cardiology

## 2010-02-13 ENCOUNTER — Ambulatory Visit: Payer: Self-pay

## 2010-02-13 ENCOUNTER — Ambulatory Visit: Payer: Self-pay | Admitting: Cardiology

## 2010-02-17 ENCOUNTER — Telehealth (INDEPENDENT_AMBULATORY_CARE_PROVIDER_SITE_OTHER): Payer: Self-pay | Admitting: *Deleted

## 2010-02-20 ENCOUNTER — Telehealth: Payer: Self-pay | Admitting: Cardiology

## 2010-02-26 ENCOUNTER — Encounter: Payer: Self-pay | Admitting: Cardiology

## 2010-03-04 ENCOUNTER — Ambulatory Visit: Payer: Self-pay | Admitting: Internal Medicine

## 2010-03-04 LAB — CONVERTED CEMR LAB: POC INR: 2.4

## 2010-03-06 ENCOUNTER — Encounter (INDEPENDENT_AMBULATORY_CARE_PROVIDER_SITE_OTHER): Payer: Self-pay | Admitting: *Deleted

## 2010-03-10 ENCOUNTER — Telehealth: Payer: Self-pay | Admitting: Internal Medicine

## 2010-03-11 ENCOUNTER — Ambulatory Visit: Payer: Self-pay | Admitting: Internal Medicine

## 2010-03-11 LAB — CONVERTED CEMR LAB
Bilirubin Urine: NEGATIVE
Hemoglobin, Urine: NEGATIVE
Ketones, ur: NEGATIVE mg/dL
pH: 6 (ref 5.0–8.0)

## 2010-03-25 ENCOUNTER — Ambulatory Visit: Payer: Self-pay | Admitting: Internal Medicine

## 2010-03-25 LAB — CONVERTED CEMR LAB
ALT: 31 units/L (ref 0–53)
AST: 30 units/L (ref 0–37)
Albumin: 3.8 g/dL (ref 3.5–5.2)
Alkaline Phosphatase: 59 units/L (ref 39–117)
Basophils Absolute: 0 10*3/uL (ref 0.0–0.1)
Basophils Relative: 0.7 % (ref 0.0–3.0)
Calcium: 9 mg/dL (ref 8.4–10.5)
Eosinophils Relative: 1.8 % (ref 0.0–5.0)
GFR calc non Af Amer: 65.16 mL/min (ref >60)
HDL: 34.5 mg/dL — ABNORMAL LOW (ref >39.00)
Hemoglobin: 14.6 g/dL (ref 13.0–17.0)
Ketones, ur: NEGATIVE mg/dL
Lymphocytes Relative: 27.5 % (ref 12.0–46.0)
Monocytes Relative: 18.2 % — ABNORMAL HIGH (ref 3.0–12.0)
Neutro Abs: 3 10*3/uL (ref 1.4–7.7)
Potassium: 4.4 meq/L (ref 3.5–5.1)
RBC: 4.83 M/uL (ref 4.22–5.81)
RDW: 14.1 % (ref 11.5–14.6)
Sodium: 143 meq/L (ref 135–145)
Specific Gravity, Urine: 1.02 (ref 1.000–1.030)
Total CHOL/HDL Ratio: 3
Urine Glucose: NEGATIVE mg/dL
Urobilinogen, UA: 1 (ref 0.0–1.0)
VLDL: 10.2 mg/dL (ref 0.0–40.0)
WBC: 5.8 10*3/uL (ref 4.5–10.5)

## 2010-04-01 ENCOUNTER — Ambulatory Visit: Payer: Self-pay | Admitting: Cardiology

## 2010-04-01 ENCOUNTER — Ambulatory Visit: Payer: Self-pay | Admitting: Internal Medicine

## 2010-04-01 DIAGNOSIS — R7989 Other specified abnormal findings of blood chemistry: Secondary | ICD-10-CM

## 2010-04-01 LAB — CONVERTED CEMR LAB: POC INR: 2.6

## 2010-04-03 ENCOUNTER — Telehealth: Payer: Self-pay | Admitting: Internal Medicine

## 2010-04-08 ENCOUNTER — Telehealth: Payer: Self-pay | Admitting: Gastroenterology

## 2010-04-08 ENCOUNTER — Ambulatory Visit: Payer: Self-pay | Admitting: Gastroenterology

## 2010-04-15 ENCOUNTER — Telehealth (INDEPENDENT_AMBULATORY_CARE_PROVIDER_SITE_OTHER): Payer: Self-pay | Admitting: *Deleted

## 2010-04-16 ENCOUNTER — Telehealth: Payer: Self-pay | Admitting: Cardiology

## 2010-04-22 ENCOUNTER — Encounter (INDEPENDENT_AMBULATORY_CARE_PROVIDER_SITE_OTHER): Payer: Self-pay | Admitting: *Deleted

## 2010-04-24 ENCOUNTER — Ambulatory Visit: Payer: Self-pay | Admitting: Gastroenterology

## 2010-04-29 ENCOUNTER — Ambulatory Visit: Payer: Self-pay | Admitting: Gastroenterology

## 2010-05-02 ENCOUNTER — Encounter: Payer: Self-pay | Admitting: Gastroenterology

## 2010-05-06 ENCOUNTER — Ambulatory Visit: Payer: Self-pay | Admitting: Cardiology

## 2010-05-06 LAB — CONVERTED CEMR LAB: POC INR: 1.7

## 2010-05-16 ENCOUNTER — Ambulatory Visit: Payer: Self-pay | Admitting: Cardiology

## 2010-05-29 ENCOUNTER — Ambulatory Visit: Payer: Self-pay | Admitting: Cardiology

## 2010-06-26 ENCOUNTER — Ambulatory Visit: Payer: Self-pay | Admitting: Internal Medicine

## 2010-06-26 LAB — CONVERTED CEMR LAB: POC INR: 2.5

## 2010-07-25 ENCOUNTER — Ambulatory Visit: Payer: Self-pay | Admitting: Cardiology

## 2010-07-25 LAB — CONVERTED CEMR LAB: POC INR: 2.8

## 2010-08-21 ENCOUNTER — Ambulatory Visit: Payer: Self-pay | Admitting: Cardiology

## 2010-09-18 ENCOUNTER — Ambulatory Visit: Payer: Self-pay | Admitting: Internal Medicine

## 2010-09-24 ENCOUNTER — Ambulatory Visit: Payer: Self-pay | Admitting: Internal Medicine

## 2010-09-24 LAB — CONVERTED CEMR LAB
AST: 37 units/L (ref 0–37)
Albumin: 4.1 g/dL (ref 3.5–5.2)
Alkaline Phosphatase: 77 units/L (ref 39–117)
Calcium: 9.6 mg/dL (ref 8.4–10.5)
Cholesterol: 133 mg/dL (ref 0–200)
Creatinine, Ser: 1 mg/dL (ref 0.4–1.5)
Free T4: 0.9 ng/dL (ref 0.60–1.60)
GFR calc non Af Amer: 83.16 mL/min (ref >60)
Total Protein: 6.9 g/dL (ref 6.0–8.3)
Triglycerides: 40 mg/dL (ref 0.0–149.0)

## 2010-09-30 ENCOUNTER — Ambulatory Visit: Payer: Self-pay | Admitting: Internal Medicine

## 2010-10-02 ENCOUNTER — Ambulatory Visit: Payer: Self-pay | Admitting: Sports Medicine

## 2010-10-17 ENCOUNTER — Ambulatory Visit: Payer: Self-pay | Admitting: Cardiology

## 2010-10-31 ENCOUNTER — Ambulatory Visit: Payer: Self-pay | Admitting: Cardiology

## 2010-11-14 ENCOUNTER — Ambulatory Visit: Payer: Self-pay | Admitting: Internal Medicine

## 2010-12-12 ENCOUNTER — Ambulatory Visit
Admission: RE | Admit: 2010-12-12 | Discharge: 2010-12-12 | Payer: Self-pay | Source: Home / Self Care | Attending: Cardiology | Admitting: Cardiology

## 2010-12-12 ENCOUNTER — Other Ambulatory Visit: Payer: Self-pay | Admitting: Cardiology

## 2010-12-12 LAB — HEPATIC FUNCTION PANEL
ALT: 23 U/L (ref 0–53)
AST: 26 U/L (ref 0–37)
Albumin: 3.7 g/dL (ref 3.5–5.2)
Alkaline Phosphatase: 77 U/L (ref 39–117)
Bilirubin, Direct: 0.3 mg/dL (ref 0.0–0.3)
Total Bilirubin: 1.8 mg/dL — ABNORMAL HIGH (ref 0.3–1.2)
Total Protein: 6.3 g/dL (ref 6.0–8.3)

## 2010-12-12 LAB — LIPID PANEL
Cholesterol: 116 mg/dL (ref 0–200)
HDL: 29.4 mg/dL — ABNORMAL LOW (ref >39.00)
LDL Cholesterol: 78 mg/dL (ref 0–99)
Total CHOL/HDL Ratio: 4
Triglycerides: 43 mg/dL (ref 0.0–149.0)
VLDL: 8.6 mg/dL (ref 0.0–40.0)

## 2010-12-15 ENCOUNTER — Ambulatory Visit: Admission: RE | Admit: 2010-12-15 | Discharge: 2010-12-15 | Payer: Self-pay | Source: Home / Self Care

## 2010-12-15 LAB — CONVERTED CEMR LAB: POC INR: 2.5

## 2010-12-16 ENCOUNTER — Encounter: Payer: Self-pay | Admitting: Cardiology

## 2010-12-19 ENCOUNTER — Telehealth: Payer: Self-pay | Admitting: Cardiology

## 2011-01-01 NOTE — Progress Notes (Signed)
Summary: speak to Patty   Phone Note Call from Patient Call back at Home Phone [phone]   Caller: Patient Call For: Christella Hartigan Reason for Call: Talk to Nurse Summary of Call: Patient was just seen and wants to speak to Patty  # [phone] Initial call taken by: Tawni Levy,  Apr 08, 2010 9:23 AM  Follow-up for Phone Call        left message on machine to call back Chales Abrahams CMA Duncan Dull)  Apr 09, 2010 9:26 AM   pt returned call and appt was made for EGD and colon and previsit he is aware to hold coumadin from 04/24/10. Follow-up by: Chales Abrahams CMA Duncan Dull),  Apr 09, 2010 10:13 AM

## 2011-01-01 NOTE — Medication Information (Signed)
Summary: rov/sl   Anticoagulant Therapy  Managed by: Weston Brass, PharmD Referring MD: Willa Rough,  MD PCP: Illene Regulus, MD Supervising MD: Shirlee Latch MD, Dalton Indication 1: Atrial Fibrillation (ICD-427.31) Lab Used: [facility] Site: [facility] INR POC 2.4 INR RANGE 2 - 3  Dietary changes: no    Health status changes: no    Bleeding/hemorrhagic complications: no    Recent/future hospitalizations: no    Any changes in medication regimen? no    Recent/future dental: no  Any missed doses?: no       Is patient compliant with meds? yes       Allergies: 1)  ! Sulfa  Anticoagulation Management History:      The patient is taking warfarin and comes in today for a routine follow up visit.  Negative risk factors for bleeding include an age less than 8 years old.  The bleeding index is 'low risk'.  Positive CHADS2 values include History of HTN.  Negative CHADS2 values include Age > 35 years old.  The start date was 08/31/2005.  Anticoagulation responsible provider: Shirlee Latch MD, Dalton.  INR POC: 2.4.  Cuvette Lot#: [id number].  Exp: 10/2011.    Anticoagulation Management Assessment/Plan:      The patient's current anticoagulation dose is Warfarin sodium 5 mg tabs: Use as directed by Anticoagulation Clinic.  The target INR is 2.0-3.0.  The next INR is due 09/18/2010.  Anticoagulation instructions were given to patient.  Results were reviewed/authorized by Weston Brass, PharmD.  He was notified by Kennieth Francois.         Prior Anticoagulation Instructions: INR 2.8  Continue taking Coumadin 1 tab (5 mg) on all days except for Coumadin 0.5 tab (2.5 mg) on Wednesdays. Return to clinic in 4 weeks.   Current Anticoagulation Instructions: INR 2.4  Continue taking one tablet every day except for one-half tablet on Wednesday.  We will see you in four weeks.

## 2011-01-01 NOTE — Progress Notes (Signed)
Summary: Nuclear Pre-Procedure  Phone Note Outgoing Call Call back at [facility] Phone [phone]   Call placed by: Stanton Kidney, EMT-P,  February 12, 2010 11:24 AM Action Taken: Phone Call Completed Summary of Call: Reviewed information on Myoview Information Sheet (see scanned document for further details).  Spoke with Patient's wife, Irving Burton.    Nuclear Med Background Indications for Stress Test: Evaluation for Ischemia, Graft Patency   History: Ablation, CABG, Echo, Heart Catheterization, Myocardial Perfusion Study, MVP  History Comments: '00 Heart Cath: EF=60%, 3V Dz> CABG x5 '03 Ablation 1/09 MPS: EF=63%, minor apical thinning, (-) scar, (-) ischemia 9/10 Echo: EF= 60-65%, mild AS     Nuclear Pre-Procedure Cardiac Risk Factors: Carotid Disease, Family History - CAD, Lipids Height (in): 73  Nuclear Med Study Referring MD:  Willa Rough MD

## 2011-01-01 NOTE — Letter (Signed)
Summary: Handout Printed  Printed Handout:  - Coumadin Instructions-w/out Meds 

## 2011-01-01 NOTE — Medication Information (Signed)
Summary: rov coumadin - [facility]  Anticoagulant Therapy  Managed by: Shelby Dubin, PharmD, BCPS, CPP Referring MD: Willa Rough MD PCP: Link Snuffer MD: Eden Emms MD, Theron Arista Indication 1: Atrial Fibrillation (ICD-427.31) Lab Used: [facility] Site: [facility] INR POC 2.6 INR RANGE 2 - 3  Dietary changes: no    Health status changes: no    Bleeding/hemorrhagic complications: no    Recent/future hospitalizations: no    Any changes in medication regimen? no    Recent/future dental: no  Any missed doses?: no       Is patient compliant with meds? yes       Current Medications (verified): 1)  Lipitor 20 Mg Tabs (Atorvastatin Calcium) .... Take 1 Tablet By Mouth Once A Day 2)  Nexium 40 Mg  Cpdr (Esomeprazole Magnesium) .Marland Brockbank.. 1 By Mouth Daily 3)  Warfarin Sodium 5 Mg Tabs (Warfarin Sodium) .... Use As Directed By Anticoagulation Clinic 4)  Adult Aspirin Low Strength 81 Mg  Tbdp (Aspirin) .... Once Daily 5)  Multivitamins   Tabs (Multiple Vitamin) .... Take 1 Tablet By Mouth Once A Day 6)  Ramipril 2.5 Mg Caps (Ramipril) .... Take One Capsule By Mouth Daily 7)  Valtrex 1 Gm Tabs (Valacyclovir Hcl) .... 2 in Am, 2 Pm Single Day Dosing For Hsv  Allergies (verified): 1)  ! Sulfa  Anticoagulation Management History:      The patient is taking warfarin and comes in today for a routine follow up visit.  Negative risk factors for bleeding include an age less than 21 years old.  The bleeding index is 'low risk'.  Positive CHADS2 values include History of HTN.  Negative CHADS2 values include Age > 28 years old.  The start date was 08/31/2005.  Anticoagulation responsible provider: Eden Emms MD, Theron Arista.  INR POC: 2.6.  Cuvette Lot#: [id number].  Exp: 03/2011.    Anticoagulation Management Assessment/Plan:      The patient's current anticoagulation dose is Warfarin sodium 5 mg tabs: Use as directed by Anticoagulation Clinic.  The target INR is 2.0-3.0.  The next INR is due 01/16/2010.   Anticoagulation instructions were given to patient.  Results were reviewed/authorized by Shelby Dubin, PharmD, BCPS, CPP.  He was notified by Shelby Dubin PharmD, BCPS, CPP.         Prior Anticoagulation Instructions: INR 2.2  Continue Coumadin 1 tab  = 5mg  each day except 1/2 tab on Wed  Current Anticoagulation Instructions: INR 2.6  Continue taking 0.5 tab each Wednesday and 1 tab on all other days.  Recheck in 4 weeks.

## 2011-01-01 NOTE — Progress Notes (Signed)
  Phone Note From Pharmacy   Caller: [facility]* Summary of Call: Recieved fax from [facility]. for tamsulosin HCL 0.4 mg CAP please Advise refill. Initial call taken by: Ami Bullins CMA,  Apr 03, 2010 9:10 AM  Follow-up for Phone Call        yep, I gfuess I forgot to eScribe it. Thanks Follow-up by: Jacques Navy MD,  Apr 03, 2010 9:31 AM    New/Updated Medications: TAMSULOSIN HCL 0.4 MG CAPS (TAMSULOSIN HCL) 1 cap daily Prescriptions: TAMSULOSIN HCL 0.4 MG CAPS (TAMSULOSIN HCL) 1 cap daily  #30 x 4   Entered by:   Ami Bullins CMA   Authorized by:   Jacques Navy MD   Signed by:   Bill Salinas CMA on 04/03/2010   Method used:   Electronically to        [facility]* (retail)       [address]       [place], [place]  [id number]       Ph: [phone]       Fax: [phone]   RxID:   [id number]

## 2011-01-01 NOTE — Assessment & Plan Note (Signed)
Summary: 6 month rov/sl   Visit Type:  Follow-up Referring Provider:  Ladona Ridgel   /  EP Primary Provider:  Illene Regulus, MD  CC:  CAD / palpitations.  History of Present Illness: The patient is seen for followup of his palpitations and coronary disease.  I saw him last January 06, 2010.  At that time I arranged for an exercise nuclear scan.  This study was done.  Patient exercised very well.  He did have a hypertensive blood pressure response.  It is of note that this was primarily systolic with a diastolic pressure not elevated significantly.  The study showed no significant ischemia.  Near peak stress he noted some palpitations.  I reviewed the strips.  He had some scattered PACs and PVCs.  He did not have any atrial fibrillation.  Because of the increased blood pressure I decided to give him a trial of taking pindolol just prior to his exercise. This was used because he has resting bradycardia.  I suggested he take a dose one hourr before exercising.  He tried this on one occasion.  He felt poorly with exercise and did not try it again.  He is exercising on a regular basis.  He has palpitations at rest at times.  At times when he is exercising the exercise equipment suggest that his heart rate jumps up significantly.  He does not feel poorly with this.  He has begun to extend his exercise longer with a lower exercise peak level.  Current Medications (verified): 1)  Lipitor 20 Mg Tabs (Atorvastatin Calcium) .... Take 1 Tablet By Mouth Once A Day 2)  Nexium 40 Mg  Cpdr (Esomeprazole Magnesium) .Marland Brucato.. 1 By Mouth Daily 3)  Warfarin Sodium 5 Mg Tabs (Warfarin Sodium) .... Use As Directed By Anticoagulation Clinic 4)  Adult Aspirin Low Strength 81 Mg  Tbdp (Aspirin) .... Once Daily 5)  Multivitamins   Tabs (Multiple Vitamin) .... Take 1 Tablet By Mouth Once A Day 6)  Ramipril 2.5 Mg Caps (Ramipril) .... Take One Capsule By Mouth Daily 7)  Valtrex 1 Gm Tabs (Valacyclovir Hcl) .... 2 in Am, 2 Pm Single  Day Dosing For Hsv Prn  Allergies: 1)  ! 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 He's nomiated to the [employer] (May '11) Married-'70-13 yrs divorce; married '97 2 daughters-'75, '79; 1 grandchild; step-daughter and step grandson Never Smoked Alcohol use-yes, 2 glasses wine/day Drug use-no Regular exercise-yes, runs 1.5-2 mi 4x/wk, also eliptical  Risk Factors: Alcohol Use: 2 (03/21/2008) Exercise: yes (04/01/2010)  Risk Factors: Smoking Status: never (04/01/2010)  Past Medical History: CORONARY ARTERY BYPASS GRAFT, HX OF (ICD-V45.81)...2000 CAD...myoview 1,2009 /  Myoview 02/13/2010... excellent exercise capacity.... hypertensive blood pressure response... no scar or ischemia.. EF 61%..mild palpitations at peak stress were infrequent PACs and PVCs.   EF  60-65%...echo..September, 2010 Carotid artery disease..mild...plan f/u 09/2009  /   doppler.Marland Kitchen11/ 2010...0-39% bilateral. follow up  2 years HYPERTENSION (ICD-401.9)..controlled at rest.... significant hypertension with treadmill HYPERLIPIDEMIA (ICD-272.4)....HDL low GERD (ICD-530.81)/Barrett's esophagus COLONIC POLYPS, HX OF (ICD-V12.72) Atrial fibrillation- s/p ablation..  (flutter ??).Marland KitchenMarland KitchenTaylor Palpitations with exercise.Marland [place].2010... we believe he has bursts of atrial fib with exercise, not ventricular arrhythmias. /  pindolol trial given  one hour before exercise stopped after one dose.... patient felt poorly with it.. February 26, 2010..  ASSESSMENT: Patient has sinus bradycardia.  Certain meds might lead  to pacemaker.  There is CAD.  Tickosyn trial  would require hospitalization and there are side effects.  Amiodarone could lead to pacemaker.Karie Soda is less effective and we chose not to try.  Sotalol could lead to pacemaker.  Patient has not failed multiple drugs, however atrial fibrillation ablation could be considered as it would be low risk for him. Coumadin Rx  Osteoarthritis L knee, mild. Chronic lateral foot pain-resolved with use of orthotics '09 Bradycardia Aortic stenosis  ..mild...echo..9/ 2010 AI...mild ...echo...9/ 2010 PVCs Niaspan.... intolerance Fish oil.... did not tolerate the first product tried..... other preparations or Fish oil pill could be considered Bilirubin... mild chronic elevation... 2.0   12/19/2009.. stable BPH.. with obstruction... Dr.Norins... may come up to zero TSH... trending up.. May, 2011... to be followed Dr.Norins  Review of Systems       Patient denies fever, chills, headache, sweats, rash, change in vision, change in hearing, chest pain, cough, nausea vomiting, urinary symptoms.  All other systems are reviewed and are negative.  Vital Signs:  Patient profile:   65 year old male Height:      71 inches Weight:      195 pounds BMI:     27.30 Pulse rate:   48 / minute Pulse rhythm:   regular BP sitting:   120 / 62  (left arm) Cuff size:   regular  Vitals Entered By: Stanton Kidney, EMT-P (May 16, 2010 3:38 PM)  Physical Exam  General:  he looks quite healthy. Head:  head is atraumatic. Eyes:  no xanthelasma. Neck:  no jugular venous extension. Chest Wall:  no chest wall tenderness. Lungs:  lungs are clear.  Respiratory effort is nonlabored. Heart:  cardiac exam reveals S1 and S2.  No clicks or significant murmurs. Abdomen:  abdomen is soft. Msk:  no musculoskeletal deformities. Extremities:  no peripheral edema. Skin:  no skin rashes. Psych:  patient is oriented to person time and place.  Affect is normal.   Impression & Recommendations:  Problem # 1:  THYROID STIMULATING  HORMONE, ABNORMAL (ICD-246.9) Dr.Norins has noted a upward trend in his TSH over time.  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  This is improved.  Problem # 3:  * PALPITATIONS ON THE TREADMILL. As outlined in the history of present illness the patient did not tolerate his single trial of a dose of Pindolol before exercise.  I will not try other beta blockers because of his resting bradycardia.  The exact thought process about the approach to this problem is outlined in the past medical history of this note.  We rereviewed all the options.  At this time we are not inclined to try other drugs or to proceed with atrial fibrillation ablation.  Problem # 4:  BRADYCARDIA (ICD-427.89) The patient has asymptomatic significant resting bradycardia.  We are careful not to add medicines that would lead to pacemaker placement.  Problem # 5:  COUMADIN THERAPY (ICD-V58.61) The patient is stable on Coumadin.  He and I discussed Pradaxa today.  I think it is likely that we will eventually switch him to this.  However we both prefer that he remain on Coumadin for another 6 months as we gather more experience with the use of this new medication.  Problem # 6:  CAD (ICD-414.00) Coronary disease is stable.  EKG is done and reviewed by me today.  He has marked sinus bradycardia.  No further workup.  Problem # 7:  HYPERTENSION (ICD-401.9)  His updated medication list for this problem includes:    Adult Aspirin Low Strength 81 Mg Tbdp (Aspirin) ..... Once daily    Ramipril 2.5 Mg Caps (Ramipril) .Marland Palmeri... Take one capsule by mouth daily The patient's blood pressure in general is low on his current medications.  Each time we try to add a medicine he feels poorly.  When he walked on the treadmill with his recent nuclear study this is the first time that we've seen significant hypertension with exercise.  It is noted that  this is primarily systolic.  He did not have a marked rise in his diastolic pressure.  I feel that it would not be wise to add other medications for his resting blood pressure.  He will not tolerate beta blockers which would be the drug of choice for him.  Therefore we are changing his exercise program such that he exercises for a longer period of time without pushing for higher levels of exercise.   Problem # 8:  * ? USE OF VITAMIN D ? The patient asked me about my opinion concerning the use of vitamin D as a supplement.  I told him that from the cardiac viewpoint I was comfortable with the use of vitamin D up to 1000 per day.  I was not in favor of higher doses.  Other Orders: EKG w/ Interpretation (93000)  Patient Instructions: 1)  Your physician wants you to follow-up in:  6 months. You will receive a reminder letter in the mail two months in advance. If you don't receive a letter, please call our office to schedule the follow-up appointment.

## 2011-01-01 NOTE — Progress Notes (Signed)
Summary: prior auth nexium   Phone Note Other Incoming   Caller: Fax from pharmacy Summary of Call: recieved prior auth form from pharmacy, I faxed [facility] a request. Initial call taken by: Chales Abrahams CMA Duncan Dull),  February 17, 2010 3:37 PM     Appended Document: prior auth nexium pt called and is only taking 1 per day, therefore a prior auth is not needed.  rx sent for once daily

## 2011-01-01 NOTE — Letter (Signed)
[facility] [address] [place], [place]  [address] Phone: [phone]      September 24, 2010   [facility] [address] [place], [place] [address]  RE:  LAB RESULTS  Dear  Russell Griffith,  The following is an interpretation of your most recent lab tests.  Please take note of any instructions provided or changes to medications that have resulted from your lab work.  ELECTROLYTES:  Good - no changes needed  LIPID PANEL:  Good - no changes needed Triglyceride: 40.0   Cholesterol: 133   LDL: 91   HDL: 33.60   Chol/HDL%:  4  THYROID STUDIES:  Thyroid studies normal TSH: 3.57     DIABETIC STUDIES:  Excellent - no changes needed Blood Glucose: 96   liver functions are normal but the total bilirubin is elevated at 2.4. Reviewed labs from the past 3 years - and bilirubin and been elevated most recently at 2.0. Three years ago is was 1.4. This may be benign Gilbert's syndrom but with the rise a GI consult may prove useful.   Please come see me if you have any questions about these lab results    Sincerely Yours,    Jacques Navy MD  Patient: Russell Griffith Note: All result statuses are Final unless otherwise noted.  Tests: (1) T4, Free (FT4R)   Free T4                   0.90 ng/dL                  0.60-1.60  Tests: (2) TSH (TSH)   FastTSH                   3.57 uIU/mL                 0.35-5.50  Tests: (3) Hepatic/Liver Function Panel (HEPATIC)   Total Bilirubin      [H]  2.4 mg/dL                   3.6-6.4   Direct Bilirubin          0.3 mg/dL                   4.0-3.4   Alkaline Phosphatase      77 U/L                      39-117   AST                       37 U/L                      0-37   ALT                       26 U/L                      0-53   Total Protein             6.9 g/dL                    7.4-2.5   Albumin                   4.1 g/dL  3.5-5.2  Tests: (4) BMP (METABOL)   Sodium                    139 mEq/L                    135-145   Potassium                 4.9 mEq/L                   3.5-5.1   Chloride                  104 mEq/L                   96-112   Carbon Dioxide            31 mEq/L                    19-32   Glucose                   96 mg/dL                    16-10   BUN                       19 mg/dL                    9-60   Creatinine                1.0 mg/dL                   4.5-4.0   Calcium                   9.6 mg/dL                   9.8-11.9   GFR                       83.16 mL/min                >60  Tests: (5) Lipid Panel (LIPID)   Cholesterol               133 mg/dL                   1-478     ATP III Classification            Desirable:  < 200 mg/dL                    Borderline High:  200 - 239 mg/dL               High:  > = 240 mg/dL   Triglycerides             40.0 mg/dL                  2.9-562.1     Normal:  <150 mg/dL     Borderline High:  308 - 199 mg/dL   HDL                  [L]  65.78 mg/dL                 >46.96   VLDL Cholesterol  8.0 mg/dL                   1.6-10.9   LDL Cholesterol           91 mg/dL                    6-04  CHO/HDL Ratio:  CHD Risk                             4                    Men          Women     1/2 Average Risk     3.4          3.3     Average Risk          5.0          4.4     2X Average Risk          9.6          7.1     3X Average Risk          15.0          11.0                           Tests: (6) Testosterone, Total (TESTO)   Testosterone              356.44 ng/dL                540.98-119.14

## 2011-01-01 NOTE — Assessment & Plan Note (Signed)
GI PROBLEM LIST: 1. History of Barrett's esophagus. No dysplasia seen on serial endoscopies 2004, 2005, 2007. Most recent EGD 2009 found short segment of Barrett's appearing mucosa, biopsies did not confirm intestinal metaplasia however. 2. Personal history of precancerous colon polyps. Adenomatous polyp removed 2003 by Dr. Corinda Gubler. Followup colonoscopy 2005 found no polyps. Dr. Doreatha Martin recommended that he have a repeat colonoscopy at 7 year interval    History of Present Illness Visit Type: Follow-up Visit Primary Provider: Illene Regulus, MD Chief Complaint: Consult before EGD pt on Coumadin History of Present Illness:     very pleasant 65 year old man whom I last saw at the time of an upper endoscopy in 2009 for surveillance of Barrett's. See those results are summarized above.  he was started on coumadin about 2 years ago for atrial fibrillation.  No overt GI bleeding.  No dysphagia.  Overall stable weight.  He takes nexium once daily.  He asked about switching to H2 blocker.           Current Medications (verified): 1)  Lipitor 20 Mg Tabs (Atorvastatin Calcium) .... Take 1 Tablet By Mouth Once A Day 2)  Nexium 40 Mg  Cpdr (Esomeprazole Magnesium) .Marland Cybulski.. 1 By Mouth Daily 3)  Warfarin Sodium 5 Mg Tabs (Warfarin Sodium) .... Use As Directed By Anticoagulation Clinic 4)  Adult Aspirin Low Strength 81 Mg  Tbdp (Aspirin) .... Once Daily 5)  Multivitamins   Tabs (Multiple Vitamin) .... Take 1 Tablet By Mouth Once A Day 6)  Ramipril 2.5 Mg Caps (Ramipril) .... Take One Capsule By Mouth Daily 7)  Valtrex 1 Gm Tabs (Valacyclovir Hcl) .... 2 in Am, 2 Pm Single Day Dosing For Hsv 8)  Tamsulosin Hcl 0.4 Mg Caps (Tamsulosin Hcl) .Marland Gjerde.. 1 Cap Daily  Allergies (verified): 1)  ! Sulfa  Vital Signs:  Patient profile:   65 year old male Height:      71 inches Weight:      194.4 pounds BMI:     27.21 Pulse rate:   60 / minute Pulse rhythm:   regular BP sitting:   118 / 62  (left arm) Cuff size:    regular  Vitals Entered By: Harlow Mares CMA Duncan Dull) (Apr 08, 2010 8:46 AM)  Physical Exam  Additional Exam:  Constitutional: generally well appearing Psychiatric: alert and oriented times 3 Abdomen: soft, non-tender, non-distended, normal bowel sounds    Impression & Recommendations:  Problem # 1:  Barrett's esophagus we will proceed with EGD at his soonest convenience to survey his Barrett's esophagus, check for dysplasia. He asked about changing to H2 blocker however since he does have Barrett's changes I think it is best for him to stay on proton pump inhibitor. He really does not have symptoms of GERD and so we are really only aiming for a theoretical benefits of the best acid suppression to potentially keep him from advancing from Barrett's to cancer.  Problem # 2:  personal history of precancerous colon polyps previous recommendation was for a colonoscopy in 2012. I disagree with that interval given that he has had adenomatous colon polyps before and so we will proceed with colonoscopy at the same time as his upper endoscopy.  Patient Instructions: 1)  Consider switcing from nexium to prilosec/prevacid or their generics once daily. 2)  You will be scheduled to have an upper endoscopy and colonoscopy for polyp surveillance. 3)  You will have to hold coumadin for 5 days prior to the tests. 4)  A copy of  this information will be sent to Dr. Debby Bud. 5)  The medication list was reviewed and reconciled.  All changed / newly prescribed medications were explained.  A complete medication list was provided to the patient / caregiver.  Appended Document:  pt to call back to schedule

## 2011-01-01 NOTE — Medication Information (Signed)
Summary: rov/nb   Anticoagulant Therapy  Managed by: Louann Sjogren, PharmD Referring MD: Willa Rough,  MD PCP: Illene Regulus, MD Supervising MD: Bensimhon MD,Daniel Indication 1: Atrial Fibrillation (ICD-427.31) Lab Used: [facility] Site: [facility] INR POC 2.2 INR RANGE 2 - 3  Dietary changes: no    Health status changes: no    Bleeding/hemorrhagic complications: no    Recent/future hospitalizations: no    Any changes in medication regimen? yes       Details: Recently increased Lipitor from 20mg  to 40mg  daily  Recent/future dental: no  Any missed doses?: yes     Details: 1 dose missed last week  Is patient compliant with meds? yes       Allergies: 1)  ! Sulfa  Anticoagulation Management History:      The patient is taking warfarin and comes in today for a routine follow up visit.  Negative risk factors for bleeding include an age less than 81 years old.  The bleeding index is 'low risk'.  Positive CHADS2 values include History of HTN.  Negative CHADS2 values include Age > 28 years old.  The start date was 08/31/2005.  Today's INR is 2.2.  Anticoagulation responsible provider: Bensimhon MD,Daniel.  INR POC: 2.2.  Cuvette Lot#: [id number].  Exp: 10/2011.    Anticoagulation Management Assessment/Plan:      The patient's current anticoagulation dose is Warfarin sodium 5 mg tabs: Use as directed by Anticoagulation Clinic.  The target INR is 2.0-3.0.  The next INR is due 12/15/2010.  Anticoagulation instructions were given to patient.  Results were reviewed/authorized by Louann Sjogren, PharmD.         Prior Anticoagulation Instructions: INR 2.4 Continue previous dose of 1 tablet everyday except 0.5 tablet on Wednesday Recheck INR in 4 weeks    Current Anticoagulation Instructions: INR 2.2  Continue taking 1 tablet daily except take 1/2 tablet on Wednesdays.  Return in about 4 weeks on Monday, Jan. 16th at 8:30AM.

## 2011-01-01 NOTE — Medication Information (Signed)
Summary: ccr. gd  Anticoagulant Therapy  Managed by: Bethena Midget, RN, BSN Referring MD: Willa Rough,  MD PCP: Link Snuffer MD: Graciela Husbands MD, Viviann Spare Indication 1: Atrial Fibrillation (ICD-427.31) Lab Used: [facility] Site: [facility] INR POC 2.4 INR RANGE 2 - 3  Dietary changes: no    Health status changes: no    Bleeding/hemorrhagic complications: no    Recent/future hospitalizations: no    Any changes in medication regimen? no    Recent/future dental: no  Any missed doses?: no       Is patient compliant with meds? yes       Allergies: 1)  ! Sulfa  Anticoagulation Management History:      The patient is taking warfarin and comes in today for a routine follow up visit.  Negative risk factors for bleeding include an age less than 64 years old.  The bleeding index is 'low risk'.  Positive CHADS2 values include History of HTN.  Negative CHADS2 values include Age > 83 years old.  The start date was 08/31/2005.  Anticoagulation responsible provider: Graciela Husbands MD, Viviann Spare.  INR POC: 2.4.  Cuvette Lot#: [id number].  Exp: 03/2011.    Anticoagulation Management Assessment/Plan:      The patient's current anticoagulation dose is Warfarin sodium 5 mg tabs: Use as directed by Anticoagulation Clinic.  The target INR is 2.0-3.0.  The next INR is due 04/01/2010.  Anticoagulation instructions were given to patient.  Results were reviewed/authorized by Bethena Midget, RN, BSN.  He was notified by Bethena Midget, RN, BSN.         Prior Anticoagulation Instructions: INR 2.7 Continue 5mg s everyday except 2.5mg s on Wednesdays. Recheck in 4 weeks.   Current Anticoagulation Instructions: INR 2.4 Continue 5mg s daily except 2.5mg s on Wednesdays. Recheck in 4 weeks.

## 2011-01-01 NOTE — Assessment & Plan Note (Signed)
Summary: ITCHING---STC   Vital Signs:  Patient profile:   65 year old male Height:      71 inches Weight:      194 pounds BMI:     27.16 O2 Sat:      96 % on Room air Temp:     98.0 degrees F oral Pulse rate:   59 / minute BP sitting:   126 / 70  (left arm) Cuff size:   regular  Vitals Entered By: Bill Salinas CMA (September 24, 2010 11:46 AM)  O2 Flow:  Room air CC: pt here with c/o itching with no visable rash x 2 weeks he also wants to recheck his thyroid / ab   Primary Care Therisa Mennella:  Illene Regulus, MD  CC:  pt here with c/o itching with no visable rash x 2 weeks he also wants to recheck his thyroid / ab.  History of Present Illness: Patient presents with a 2-3 week h/o pruritis: he describes this as intermittent with a "small fly" on the skin type sensation that can occur anywhere on his body. He has otherwise felt OK. He denies any new allergens. No abdominal pain. No dry skin, no tachycardia other than his occasion A. fib.   He does report that he has decreased tumescence and duration. Denies any change in libido, loss of muscle mass or tone, no change in exercise indurance, no cognitive change.   Current Medications (verified): 1)  Lipitor 20 Mg Tabs (Atorvastatin Calcium) .... Take 1 Tablet By Mouth Once A Day 2)  Nexium 40 Mg  Cpdr (Esomeprazole Magnesium) .Marland Polack.. 1 By Mouth Daily 3)  Warfarin Sodium 5 Mg Tabs (Warfarin Sodium) .... Use As Directed By Anticoagulation Clinic 4)  Adult Aspirin Low Strength 81 Mg  Tbdp (Aspirin) .... Once Daily 5)  Multivitamins   Tabs (Multiple Vitamin) .... Take 1 Tablet By Mouth Once A Day 6)  Ramipril 2.5 Mg Caps (Ramipril) .... Take One Capsule By Mouth Daily 7)  Valtrex 1 Gm Tabs (Valacyclovir Hcl) .... 2 in Am, 2 Pm Single Day Dosing For Hsv Prn  Allergies (verified): 1)  ! Sulfa  Past History:  Past Medical History: Last updated: 05/16/2010 CORONARY ARTERY BYPASS GRAFT, HX OF (ICD-V45.81)...2000 CAD...myoview 1,2009 /  Myoview  02/13/2010... excellent exercise capacity.... hypertensive blood pressure response... no scar or ischemia.. EF 61%..mild palpitations at peak stress were infrequent PACs and PVCs.   EF  60-65%...echo..September, 2010 Carotid artery disease..mild...plan f/u 09/2009  /   doppler.Marland Kitchen11/ 2010...0-39% bilateral. follow up  2 years HYPERTENSION (ICD-401.9)..controlled at rest.... significant hypertension with treadmill HYPERLIPIDEMIA (ICD-272.4)....HDL low GERD (ICD-530.81)/Barrett's esophagus COLONIC POLYPS, HX OF (ICD-V12.72) Atrial fibrillation- s/p ablation..  (flutter ??).Marland KitchenMarland KitchenTaylor Palpitations with exercise.Marland Decarolis.2010... we believe he has bursts of atrial fib with exercise, not ventricular arrhythmias. /  pindolol trial given one hour before exercise stopped after one dose.... patient felt poorly with it.. February 26, 2010..  ASSESSMENT: Patient has sinus bradycardia.  Certain meds might lead  to pacemaker.  There is CAD.  Tickosyn trial would require hospitalization and there are side effects.  Amiodarone could lead to pacemaker.Karie Soda is less effective and we chose not to try.  Sotalol could lead to pacemaker.  Patient has not failed multiple drugs, however atrial fibrillation ablation could be considered as it would be low risk for him. Coumadin Rx  Osteoarthritis L knee, mild. Chronic lateral foot pain-resolved with use of orthotics '09 Bradycardia Aortic stenosis  ..mild...echo..9/ 2010 AI...mild ...echo...9/ 2010 PVCs Niaspan.... intolerance Fish oil.Marland KitchenMarland KitchenMarland Boyar  did not tolerate the first product tried..... other preparations or Fish oil pill could be considered Bilirubin... mild chronic elevation... 2.0   12/19/2009.. stable BPH.. with obstruction... Dr.Norins... may come up to zero TSH... trending up.. May, 2011... to be followed Dr.Norins  Past Surgical History: Last updated: 03/21/2008 cabg-2000 Tonsillectomy-remotely FH reviewed for relevance, SH/Risk Factors reviewed for relevance  Review of  Systems  The patient denies anorexia, fever, weight loss, weight gain, chest pain, syncope, dyspnea on exertion, prolonged cough, abdominal pain, severe indigestion/heartburn, muscle weakness, suspicious skin lesions, enlarged lymph nodes, and angioedema.    Physical Exam  General:  Well-developed,well-nourished,in no acute distress; alert,appropriate and cooperative throughout examination Head:  normocephalic.   Eyes:  pupils equal and pupils round.  C&S clear Lungs:  normal respiratory effort, normal breath sounds, no crackles, and no wheezes.   Heart:  normal rate and regular rhythm.   Abdomen:  soft, non-tender, normal bowel sounds, no masses, and no hepatomegaly.   Pulses:  2+ radial Neurologic:  alert & oriented X3, cranial nerves II-XII intact, and gait normal.   Skin:  turgor normal, color normal, no rashes, no suspicious lesions, no petechiae, and no purpura.   Cervical Nodes:  no anterior cervical adenopathy and no posterior cervical adenopathy.   Psych:  Oriented X3 and memory intact for recent and remote.     Impression & Recommendations:  Problem # 1:  LIBIDO, DECREASED (ICD-799.81) no systemic symptoms of hypogonadism  Plan - testosterone level           may try viagra if he wishes  addendum - testosterone in normal range  Problem # 2:  PRURITUS (ICD-698.9) Patient with mild pruritis and a normal exam.  Plan - lab workup for metabolic cause of pruritis           claritin for itching.  Orders: TLB-T4 (Thyrox), Free [phone]) TLB-TSH (Thyroid Stimulating Hormone) (84443-TSH) TLB-Hepatic/Liver Function Pnl (80076-HEPATIC) TLB-BMP (Basic Metabolic Panel-BMET) (80048-METABOL)  addendum - normal labs.  Complete Medication List: 1)  Lipitor 20 Mg Tabs (Atorvastatin calcium) .... Take 1 tablet by mouth once a day 2)  Nexium 40 Mg Cpdr (Esomeprazole magnesium) .Marland Cranshaw.. 1 by mouth daily 3)  Warfarin Sodium 5 Mg Tabs (Warfarin sodium) .... Use as directed by  anticoagulation clinic 4)  Adult Aspirin Low Strength 81 Mg Tbdp (Aspirin) .... Once daily 5)  Multivitamins Tabs (Multiple vitamin) .... Take 1 tablet by mouth once a day 6)  Ramipril 2.5 Mg Caps (Ramipril) .... Take one capsule by mouth daily 7)  Valtrex 1 Gm Tabs (Valacyclovir hcl) .... 2 in am, 2 pm single day dosing for hsv prn  Other Orders: TLB-Lipid Panel (80061-LIPID) TLB-Testosterone, Total (84403-TESTO)   Orders Added: 1)  TLB-T4 (Thyrox), Free [91478-GN5A] 2)  TLB-TSH (Thyroid Stimulating Hormone) [84443-TSH] 3)  TLB-Hepatic/Liver Function Pnl [80076-HEPATIC] 4)  TLB-BMP (Basic Metabolic Panel-BMET) [80048-METABOL] 5)  TLB-Lipid Panel [80061-LIPID] 6)  TLB-Testosterone, Total [84403-TESTO] 7)  Est. Patient Level III [21308]

## 2011-01-01 NOTE — Letter (Signed)
Summary: Results Letter   Gastroenterology  [address] [place], [place] [address]   Phone: [phone]  Fax: [phone]        May 02, 2010 MRN: [id number]    [facility] [address] [place], [place]  [address]    Dear Mr. Mensing,   The polyps removed during your recent procedure was proven to be adenomatous.  These are pre-cancerous polyps that may have grown into cancers if they had not been removed.  Based on current nationally recognized surveillance guidelines, I recommend that you have a repeat colonoscopy in 5 years.  We will therefore put your information in our reminder system and will contact you in 5 years to schedule a repeat procedure.    The biopsies of your esophagus showed NO Barrett's changes.  Since you have had biopsy proven Barrett's in the past, I think it is still reasonable to repeat EGD in 2 1/2 years however if NO Barrett's is again shown, then will have to consider stopping surveillance of your esophagus.       Sincerely,  Rachael Fee MD  This letter has been electronically signed by your physician.

## 2011-01-01 NOTE — Progress Notes (Signed)
Summary: U/A  Phone Note Call from Patient Call back at [phone]   Summary of Call: Pt thinks he may have a uti. He would like u/a if possible.  Initial call taken by: Lamar Sprinkles, CMA,  March 10, 2010 1:16 PM  Follow-up for Phone Call        Pt c/o urinary frequency and burning x 4 days. Ok for u/a?  Follow-up by: Lamar Sprinkles, CMA,  March 10, 2010 2:35 PM  Additional Follow-up for Phone Call Additional follow up Details #1::        may come by in the Am for a u/a - 788.1 Additional Follow-up by: Jacques Navy MD,  March 10, 2010 6:13 PM    Additional Follow-up for Phone Call Additional follow up Details #2::    Pt informed, lab in idx, hold phone note for results.....................Marland KitchenLamar Sprinkles, CMA  March 10, 2010 6:20 PM   Results in EMR, please advise.........................................................Marland KitchenLamar Sprinkles, CMA  March 11, 2010 11:11 AM   U/A is negative without sign of infection Follow-up by: Jacques Navy MD,  March 11, 2010 1:10 PM  Additional Follow-up for Phone Call Additional follow up Details #3:: Details for Additional Follow-up Action Taken: Pt informed  Additional Follow-up by: Lamar Sprinkles, CMA,  March 11, 2010 5:57 PM

## 2011-01-01 NOTE — Progress Notes (Signed)
Summary: myoview results   Phone Note Call from Patient Call back at Work Phone [phone]   Caller: Patient Reason for Call: Talk to Nurse, Talk to Doctor, Lab or Test Results Summary of Call: pt wantsd resultd of mtyoview Initial call taken by: Omer Jack,  February 20, 2010 9:05 AM  Follow-up for Phone Call        Dr Myrtis Ser is reviewing info and will call pt Meredith Staggers, RN  February 21, 2010 2:50 PM   Additional Follow-up for Phone Call Additional follow up Details #1::        I called the patient.

## 2011-01-01 NOTE — Assessment & Plan Note (Signed)
Summary: Cardiology Nuclear Study  Nuclear Med Background Indications for Stress Test: Evaluation for Ischemia, Graft Patency   History: Ablation, CABG, Echo, Heart Catheterization, Myocardial Perfusion Study, MVP  History Comments: '00 CABG x 5, EF=60%; '03 Ablation; 1/09 WJX:BJYNW apical thinning, no scar or ischemia, EF=63%; 9/10 Echo:EF= 60-65%, mild AS; h/o PAF/flutter  Symptoms: DOE, Palpitations    Nuclear Pre-Procedure Cardiac Risk Factors: Carotid Disease, Family History - CAD, Lipids Caffeine/Decaff Intake: None NPO After: 8:00 PM Lungs: Clear IV 0.9% NS with Angio Cath: 20g     IV Site: (R) AC IV Started by: Irean Hong RN Chest Size (in) 43     Height (in): 71 Weight (lb): 187 BMI: 26.18  Nuclear Med Study 1 or 2 day study:  1 day     Stress Test Type:  Stress Reading MD:  Marca Ancona, MD     Referring MD:  Willa Rough,  MD Resting Radionuclide:  Technetium 68m Tetrofosmin     Resting Radionuclide Dose:  11 mCi  Stress Radionuclide:  Technetium 17m Tetrofosmin     Stress Radionuclide Dose:  33 mCi   Stress Protocol Exercise Time (min):  11:01 min     Max HR:  141 bpm     Predicted Max HR:  158 bpm  Max Systolic BP: 234 mm Hg     Percent Max HR:  89.24 %     METS: 13.4 Rate Pressure Product:  29562    Stress Test Technologist:  Rea [employer] CMA-N     Nuclear Technologist:  Domenic Polite CNMT  Rest Procedure  Myocardial perfusion imaging was performed at rest 45 minutes following the intravenous administration of Myoview Technetium 10m Tetrofosmin.  Stress Procedure  The patient exercised for 11:01.  The patient stopped due to a hypertensive response, 234/82. He denied any chest pain.  There were no diagnostic ST-T wave changes, only occasional PAC's and rare PVC's/PJC's.  Myoview was injected at peak exercise and myocardial perfusion imaging was performed after a brief delay.  QPS Raw Data Images:  Normal; no motion artifact; normal heart/lung  ratio. Stress Images:  NI: Uniform and normal uptake of tracer in all myocardial segments. Rest Images:  Normal homogeneous uptake in all areas of the myocardium. Subtraction (SDS):  There is no evidence of scar or ischemia. Transient Ischemic Dilatation:  1.02  (Normal <1.22)  Lung/Heart Ratio:  .33  (Normal <0.45)  Quantitative Gated Spect Images QGS EDV:  133 ml QGS ESV:  52 ml QGS EF:  61 % QGS cine images:  Normal wall motion.    Overall Impression  Exercise Capacity: Excellent exercise capacity. BP Response: Hypertensive blood pressure response. Clinical Symptoms: Fatigue, no chest pain.  ECG Impression: No significant ST segment change suggestive of ischemia. Overall Impression: Normal stress nuclear study. Overall Impression Comments: Hypertensive BP response.

## 2011-01-01 NOTE — Medication Information (Signed)
Summary: rov/sl  Anticoagulant Therapy  Managed by: Weston Brass, PharmD Referring MD: Willa Rough,  MD PCP: Illene Regulus, MD Supervising MD: Nahser Indication 1: Atrial Fibrillation (ICD-427.31) Lab Used: [facility] Site: [facility] INR POC 2.4 INR RANGE 2 - 3  Dietary changes: no    Health status changes: no    Bleeding/hemorrhagic complications: no    Recent/future hospitalizations: no    Any changes in medication regimen? no    Recent/future dental: no  Any missed doses?: no       Is patient compliant with meds? yes       Allergies: 1)  ! Sulfa  Anticoagulation Management History:      The patient is taking warfarin and comes in today for a routine follow up visit.  Negative risk factors for bleeding include an age less than 35 years old.  The bleeding index is 'low risk'.  Positive CHADS2 values include History of HTN.  Negative CHADS2 values include Age > 42 years old.  The start date was 08/31/2005.  Anticoagulation responsible Faithe Ariola: Nahser.  INR POC: 2.4.  Cuvette Lot#: [id number].  Exp: 10/2011.    Anticoagulation Management Assessment/Plan:      The patient's current anticoagulation dose is Warfarin sodium 5 mg tabs: Use as directed by Anticoagulation Clinic.  The target INR is 2.0-3.0.  The next INR is due 11/14/2010.  Anticoagulation instructions were given to patient.  Results were reviewed/authorized by Weston Brass, PharmD.  He was notified by Hoy Register, PharmD Candidate.         Prior Anticoagulation Instructions: INR 2.5  Continue taking Coumadin 5 mg (1 tab) all days except Coumadin 2.5 mg (0.5 tab) on Wednesdays.  Return to clinic in 4 weeks.   Current Anticoagulation Instructions: INR 2.4 Continue previous dose of 1 tablet everyday except 0.5 tablet on Wednesday Recheck INR in 4 weeks

## 2011-01-01 NOTE — Assessment & Plan Note (Signed)
Summary: f46m  Medications Added LIPITOR 40 MG TABS (ATORVASTATIN CALCIUM) Take one tablet by mouth daily. ZITHROMAX Z-PAK 250 MG TABS (AZITHROMYCIN) take as directed      Allergies Added:   Visit Type:  Follow-up Referring Provider:  Sharrell Ku, MD Primary Provider:  Illene Regulus, MD  CC:  CAD.  History of Present Illness: at the patient is seen for followup of her artery disease and atrial fibrillation.  He is fully active.  We know that he has some bursts of increased heart rate with atrial fibrillation when he is exercising.  There has been full discussion of the possibility of using antiarrhythmics or of considering atrial fibrillation ablation.  He has chosen to be followed.  I gave him a very brief trial of pindolol to be used just before exercise.  He felt poorly with this and did not continue.  After his last visit TSH was checked and it was normal.  His most recent LDL is higher than usual for him at 91.  He's feeling well in general other than a recent head cold.  This is persisting.  Current Medications (verified): 1)  Lipitor 20 Mg Tabs (Atorvastatin Calcium) .... Take 1 Tablet By Mouth Once A Day 2)  Nexium 40 Mg  Cpdr (Esomeprazole Magnesium) .Marland Worthing.. 1 By Mouth Daily 3)  Warfarin Sodium 5 Mg Tabs (Warfarin Sodium) .... Use As Directed By Anticoagulation Clinic 4)  Adult Aspirin Low Strength 81 Mg  Tbdp (Aspirin) .... Once Daily 5)  Multivitamins   Tabs (Multiple Vitamin) .... Take 1 Tablet By Mouth Once A Day 6)  Ramipril 2.5 Mg Caps (Ramipril) .... Take One Capsule By Mouth Daily 7)  Valtrex 1 Gm Tabs (Valacyclovir Hcl) .... 2 in Am, 2 Pm Single Day Dosing For Hsv Prn  Allergies (verified): 1)  ! Sulfa  Past History:  Past Medical History: CORONARY ARTERY BYPASS GRAFT, HX OF (ICD-V45.81)...2000 CAD...myoview 1,2009 /  Myoview 02/13/2010... excellent exercise capacity.... hypertensive blood pressure response... no scar or ischemia.. EF 61%..mild palpitations at  peak stress were infrequent PACs and PVCs.   EF  60-65%...echo..September, 2010 Carotid artery disease..mild...plan f/u 09/2009  /   doppler.Marland Kitchen11/ 2010...0-39% bilateral. follow up  2 years HYPERTENSION (ICD-401.9)..controlled at rest.... significant hypertension with treadmill HYPERLIPIDEMIA (ICD-272.4)....HDL low GERD (ICD-530.81)/Barrett's esophagus COLONIC POLYPS, HX OF (ICD-V12.72) Atrial fibrillation- s/p ablation..  (flutter ??).Marland KitchenMarland KitchenTaylor Palpitations with exercise.Marland Heyer.2010... we believe he has bursts of atrial fib with exercise, not ventricular arrhythmias. /  pindolol trial given one hour before exercise stopped after one dose.... patient felt poorly with it.. February 26, 2010..  ASSESSMENT: Patient has sinus bradycardia.  Certain meds might lead  to pacemaker.  There is CAD.  Tickosyn trial would require hospitalization and there are side effects.  Amiodarone could lead to pacemaker.Karie Soda is less effective and we chose not to try.  Sotalol could lead to pacemaker.  Patient has not failed multiple drugs, however atrial fibrillation ablation could be considered as it would be low risk for him. Coumadin Rx  Osteoarthritis L knee, mild. Chronic lateral foot pain-resolved with use of orthotics '09 Bradycardia Aortic stenosis  ..mild...echo..9/ 2010 AI...mild ...echo...9/ 2010 PVCs Niaspan.... intolerance Fish oil.... did not tolerate the first product tried..... other preparations or Fish oil pill could be considered Bilirubin... mild chronic elevation... 2.0   12/19/2009.. stable BPH.. with obstruction... Dr.Norins... may come up to zero TSH... trending up.. May, 2011... to be followed Dr.Norins  /  TSH normal in followup  Review of Systems  Patient denies fever, chills, headache, sweats, rash, change in vision, change in hearing, chest pain, cough, nausea vomiting, urinary symptoms.  All of the systems are reviewed and are negative.  Vital Signs:  Patient profile:   65 year old  male Height:      71 inches Weight:      194 pounds BMI:     27.16 Pulse rate:   75 / minute BP sitting:   110 / 70  (left arm) Cuff size:   regular  Vitals Entered By: Hardin Negus, RMA (October 31, 2010 9:45 AM)  Physical Exam  General:  patient looks good other than his head cold. Head:  head is atraumatic. Eyes:  no xanthelasma. Neck:  no jugular venous distention. Chest Wall:  no chest wall tenderness. Lungs:  lungs are clear.  Respiratory effort is nonlabored. Heart:  cardiac exam reveals an S1-S2.  No clicks.  There is a very soft systolic murmur. Abdomen:  abdomen soft. Msk:  no musculoskeletal deformities. Extremities:  no peripheral edema. Skin:  no skin rash. Psych:  patient is oriented to person time and place.  Affect is normal.   Impression & Recommendations:  Problem # 1:  * PERSISTENT UPPER RESPIRATORY INFECTION The patient has a persistent upper respiratory infection.  We will recommend Claritin or an equivalent and an antibiotic.  Problem # 2:  THYROID STIMULATING HORMONE, ABNORMAL (ICD-246.9) A followup TSH after his last visit in June was normal.  No further workup.  Problem # 3:  * BILIRUBIN... MILD ELEVATION.Marland Upson CHRONIC The patient has mild chronic bilirubin elevation.  This is not related to his statin therapy.  Problem # 4:  carotid artery disease Patient has mild disease as assessed in 2010.  He needs followup 2 years after that study.  Problem # 5:  * PALPITATIONS ON THE TREADMILL. The palpitations on the treadmill are related to burst of atrial fib.  We continue to follow this.  Problem # 6:  AORTIC INSUFFICIENCY (ICD-424.1)  The following medications were removed from the medication list:    Nitroglycerin 0.2 Mg/hr Pt24 (Nitroglycerin) .Marland Weathersby... Apply 1/4 patch to right shoulder daily His updated medication list for this problem includes:    Ramipril 2.5 Mg Caps (Ramipril) .Marland Marciel... Take one capsule by mouth daily Aortic insufficiency is mild and  will be followed.  Problem # 7:  COUMADIN THERAPY (ICD-V58.61) I have discussed Pradaxa with the patient at his request.  I told him that I am now comfortable that he could be switched to this if he would like.  He is considering and we will rediscuss this at the next visit.  Problem # 8:  CAD (ICD-414.00)  The following medications were removed from the medication list:    Nitroglycerin 0.2 Mg/hr Pt24 (Nitroglycerin) .Marland Faiella... Apply 1/4 patch to right shoulder daily His updated medication list for this problem includes:    Warfarin Sodium 5 Mg Tabs (Warfarin sodium) ..... Use as directed by anticoagulation clinic    Adult Aspirin Low Strength 81 Mg Tbdp (Aspirin) ..... Once daily    Ramipril 2.5 Mg Caps (Ramipril) .Marland Surette... Take one capsule by mouth daily Coronary disease is stable.  No further workup.  Problem # 9:  HYPERLIPIDEMIA (ICD-272.4)  His updated medication list for this problem includes:    Lipitor 40 Mg Tabs (Atorvastatin calcium) .Marland Luckey... Take one tablet by mouth daily. The patient's most recent LDL in October, 2011 was 91.  I will discuss possibly increasing his Lipitor.  Patient Instructions:  1)  Increase Lipitor to 40mg  daily 2)  We have sent you in a prescription for a Z-pak, you may also use plain Claritin, Zyrtec, or Allegra 3)  Your physician recommends that you return for a FASTING lipid and liver profile: in 6 weeks (414.01, 272.0) 4)  Your physician wants you to follow-up in:  6 months.  You will receive a reminder letter in the mail two months in advance. If you don't receive a letter, please call our office to schedule the follow-up appointment. Prescriptions: ZITHROMAX Z-PAK 250 MG TABS (AZITHROMYCIN) take as directed  #1 pack x 0   Entered by:   Meredith Staggers, RN   Authorized by:   Talitha Givens, MD, [facility]   Signed by:   Meredith Staggers, RN on 10/31/2010   Method used:   Electronically to        [facility]* (retail)       [address]        [place], [place]  [id number]       Ph: [phone]       Fax: [phone]   RxID:   [id number] LIPITOR 40 MG TABS (ATORVASTATIN CALCIUM) Take one tablet by mouth daily.  #30 x 6   Entered by:   Meredith Staggers, RN   Authorized by:   Talitha Givens, MD, [facility]   Signed by:   Meredith Staggers, RN on 10/31/2010   Method used:   Electronically to        [facility]* (retail)       [address]       [place], [place]  [id number]       Ph: [phone]       Fax: [phone]   RxID:   [id number]

## 2011-01-01 NOTE — Medication Information (Signed)
Summary: rov/tm  Anticoagulant Therapy  Managed by: Weston Brass, PharmD Referring MD: Willa Rough,  MD PCP: Illene Regulus, MD Supervising MD: Jens Som MD, Arlys Kester Indication 1: Atrial Fibrillation (ICD-427.31) Lab Used: [facility] Site: [facility] INR POC 2.2 INR RANGE 2 - 3  Dietary changes: no    Health status changes: no    Bleeding/hemorrhagic complications: no    Recent/future hospitalizations: no    Any changes in medication regimen? no    Recent/future dental: no  Any missed doses?: yes     Details: missed 1 dose last week  Is patient compliant with meds? yes       Allergies: 1)  ! Sulfa  Anticoagulation Management History:      The patient is taking warfarin and comes in today for a routine follow up visit.  Negative risk factors for bleeding include an age less than 32 years old.  The bleeding index is 'low risk'.  Positive CHADS2 values include History of HTN.  Negative CHADS2 values include Age > 51 years old.  The start date was 08/31/2005.  Anticoagulation responsible provider: Jens Som MD, Arlys Ashrith.  INR POC: 2.2.  Cuvette Lot#: [id number].  Exp: 08/2011.    Anticoagulation Management Assessment/Plan:      The patient's current anticoagulation dose is Warfarin sodium 5 mg tabs: Use as directed by Anticoagulation Clinic.  The target INR is 2.0-3.0.  The next INR is due 06/26/2010.  Anticoagulation instructions were given to patient.  Results were reviewed/authorized by Weston Brass, PharmD.  He was notified by Weston Brass PharmD.         Prior Anticoagulation Instructions: INR 1.7 Today take extra 2.5mg s then resume 5mg s everyday except 2.5mg s on Wednesdays. Recheck in 3 weeks.   Current Anticoagulation Instructions: INR 2.2  Continue same dose of 1 tablet every day except 1/2 tablet on Wednesday.

## 2011-01-01 NOTE — Letter (Signed)
Summary: Custom - Lipid  [facility], Main Office  [address]   [place], [place] [address]   Phone: [phone]  Fax: [phone]     December 20, 2009 MRN: [id number]   [facility] [address] [place], [place]  [address]   Dear [name],  We have reviewed your cholesterol results.  They are as follows:     Total Cholesterol:    125 (Desirable: less than 200)       HDL  Cholesterol:     31.90  (Desirable: greater than 40 for men and 50 for women)       LDL Cholesterol:       78  (Desirable: less than 100 for low risk and less than 70 for moderate to high risk)       Triglycerides:       77.0  (Desirable: less than 150)  Our recommendations include:  Looks good, continue current meds.   Call our office at the number listed above if you have any questions.  Lowering your LDL cholesterol is important, but it is only one of a large number of "risk factors" that may indicate that you are at risk for heart disease, stroke or other complications of hardening of the arteries.  Other risk factors include:   A.  Cigarette Smoking* B.  High Blood Pressure* C.  Obesity* D.   Low HDL Cholesterol (see yours above)* E.   Diabetes Mellitus (higher risk if your is uncontrolled) F.  Family history of premature heart disease G.  Previous history of stroke or cardiovascular disease    *These are risk factors YOU HAVE CONTROL OVER.  For more information, visit .  There is now evidence that lowering the TOTAL CHOLESTEROL AND LDL CHOLESTEROL can reduce the risk of heart disease.  The American Heart Association recommends the following guidelines for the treatment of elevated cholesterol:  1.  If there is now current heart disease and less than two risk factors, TOTAL CHOLESTEROL should be less than 200 and LDL CHOLESTEROL should be less than 100. 2.  If there is current heart disease or two or more risk factors, TOTAL CHOLESTEROL should be less than 200 and LDL  CHOLESTEROL should be less than 70.  A diet low in cholesterol, saturated fat, and calories is the cornerstone of treatment for elevated cholesterol.  Cessation of smoking and exercise are also important in the management of elevated cholesterol and preventing vascular disease.  Studies have shown that 30 to 60 minutes of physical activity most days can help lower blood pressure, lower cholesterol, and keep your weight at a healthy level.  Drug therapy is used when cholesterol levels do not respond to therapeutic lifestyle changes (smoking cessation, diet, and exercise) and remains unacceptably high.  If medication is started, it is important to have you levels checked periodically to evaluate the need for further treatment options.  Thank you,    [employer] Team

## 2011-01-01 NOTE — Miscellaneous (Signed)
  Clinical Lists Changes  Observations: Added new observation of CARDIO HPI: I have carefully reviewed the nuclear stress test.  The patient had a hypertensive response.  After careful review I have chosen to start him on pindolol 5 mg approximately one hour before he does his formal exercise program on the treadmill and elliptical.  He and I discussed this at length.  It may help his hypertensive response to exercise.  It may help with his arrhythmias with exercise.  Pindolol is being used because of his significant resting bradycardia. I personally called the pindolol prescription to his pharmacy.  During our discussion he mentioned to me about is that he feels after his exercise approximately 50% of the time.  This sounds like a rapid atrial fibrillation.  He also mentioned that at the end of the stress test in the office he could hear from the monitor that there was some irregular heartbeats.  I suspect that this is different and represents PACs and PVCs that we have seen.  I will re review the strips from his stress test.  I will talk to him after he is tried pindolol for several days. (02/26/2010 17:26) Added new observation of REFERRING MD: Ladona Ridgel   /  EP (02/26/2010 17:26) Added new observation of PRIMARY MD: Norins (02/26/2010 17:26)      Referring Provider:  Ladona Ridgel   /  EP Primary Provider:  Norins   History of Present Illness: I have carefully reviewed the nuclear stress test.  The patient had a hypertensive response.  After careful review I have chosen to start him on pindolol 5 mg approximately one hour before he does his formal exercise program on the treadmill and elliptical.  He and I discussed this at length.  It may help his hypertensive response to exercise.  It may help with his arrhythmias with exercise.  Pindolol is being used because of his significant resting bradycardia. I personally called the pindolol prescription to his pharmacy.  During our discussion he mentioned  to me about is that he feels after his exercise approximately 50% of the time.  This sounds like a rapid atrial fibrillation.  He also mentioned that at the end of the stress test in the office he could hear from the monitor that there was some irregular heartbeats.  I suspect that this is different and represents PACs and PVCs that we have seen.  I will re review the strips from his stress test.  I will talk to him after he is tried pindolol for several days.

## 2011-01-01 NOTE — Miscellaneous (Signed)
  Clinical Lists Changes  Observations: Added new observation of PAST MED HX: CORONARY ARTERY BYPASS GRAFT, HX OF (ICD-V45.81)...2000 CAD...myoview 1,2009 /  Myoview 02/13/2010... excellent exercise capacity.... hypertensive blood pressure response... no scar or ischemia.. EF 61% EF  60-65%...echo..September, 2010 Carotid artery disease..mild...plan f/u 09/2009  /   doppler.Marland Kitchen11/ 2010...0-39% bilateral. follow up  2 years HYPERTENSION (ICD-401.9)..controlled at rest.... significant hypertension with treadmill HYPERLIPIDEMIA (ICD-272.4)....HDL low GERD (ICD-530.81)/Barrett's esophagus COLONIC POLYPS, HX OF (ICD-V12.72) Atrial fibrillation- s/p ablation..  (flutter ??).Marland KitchenMarland KitchenTaylor Palpitations with exercise.Marland Kettering.2010... we believe he has bursts of atrial fib with exercise, not ventricular arrhythmias. /  pindolol trial given one hour before exercise stopped after one dose.... patient felt poorly with it.. February 26, 2010..  ASSESSMENT: Patient has sinus bradycardia.  Certain meds might lead  to pacemaker.  There is CAD.  Tickosyn trial would require hospitalization and there are side effects.  Amiodarone could lead to pacemaker.Karie Soda is less effective and we chose not to try.  Sotalol could lead to pacemaker.  Patient has not failed multiple drugs, however atrial fibrillation ablation could be considered as it would be low risk for him. Coumadin Rx Osteoarthritis L knee, mild. Chronic lateral foot pain-resolved with use of orthotics '09 Bradycardia Aortic stenosis  ..mild...echo..9/ 2010 AI...mild ...echo...9/ 2010 PVCs Niaspan.... intolerance Fish oil.... did not tolerate the first product tried..... other preparations or Fish oil pill could be considered Bilirubin... mild chronic elevation... 2.0   12/19/2009.. stable   (05/16/2010 13:35) Added new observation of REFERRING MD: Ladona Ridgel   /  EP (05/16/2010 13:35) Added new observation of PRIMARY MD: Illene Regulus, MD (05/16/2010  13:35)       Past History:  Past Medical History: CORONARY ARTERY BYPASS GRAFT, HX OF (ICD-V45.81)...2000 CAD...myoview 1,2009 /  Myoview 02/13/2010... excellent exercise capacity.... hypertensive blood pressure response... no scar or ischemia.. EF 61% EF  60-65%...echo..September, 2010 Carotid artery disease..mild...plan f/u 09/2009  /   doppler.Marland Kitchen11/ 2010...0-39% bilateral. follow up  2 years HYPERTENSION (ICD-401.9)..controlled at rest.... significant hypertension with treadmill HYPERLIPIDEMIA (ICD-272.4)....HDL low GERD (ICD-530.81)/Barrett's esophagus COLONIC POLYPS, HX OF (ICD-V12.72) Atrial fibrillation- s/p ablation..  (flutter ??).Marland KitchenMarland KitchenTaylor Palpitations with exercise.Marland Lashway.2010... we believe he has bursts of atrial fib with exercise, not ventricular arrhythmias. /  pindolol trial given one hour before exercise stopped after one dose.... patient felt poorly with it.. February 26, 2010..  ASSESSMENT: Patient has sinus bradycardia.  Certain meds might lead  to pacemaker.  There is CAD.  Tickosyn trial would require hospitalization and there are side effects.  Amiodarone could lead to pacemaker.Karie Soda is less effective and we chose not to try.  Sotalol could lead to pacemaker.  Patient has not failed multiple drugs, however atrial fibrillation ablation could be considered as it would be low risk for him. Coumadin Rx Osteoarthritis L knee, mild. Chronic lateral foot pain-resolved with use of orthotics '09 Bradycardia Aortic stenosis  ..mild...echo..9/ 2010 AI...mild ...echo...9/ 2010 PVCs Niaspan.... intolerance Fish oil.... did not tolerate the first product tried..... other preparations or Fish oil pill could be considered Bilirubin... mild chronic elevation... 2.0   12/19/2009.. stable

## 2011-01-01 NOTE — Medication Information (Signed)
Summary: rov.mp  Anticoagulant Therapy  Managed by: Bethena Midget, RN, BSN Referring MD: Willa Rough MD PCP: Link Snuffer MD: Myrtis Ser MD, Tinnie Gens Indication 1: Atrial Fibrillation (ICD-427.31) Lab Used: [facility] Site: [facility] INR POC 2.7 INR RANGE 2 - 3  Dietary changes: no    Health status changes: no    Bleeding/hemorrhagic complications: no    Recent/future hospitalizations: no    Any changes in medication regimen? no    Recent/future dental: no  Any missed doses?: no       Is patient compliant with meds? yes       Allergies: 1)  ! Sulfa  Anticoagulation Management History:      The patient is taking warfarin and comes in today for a routine follow up visit.  Negative risk factors for bleeding include an age less than 74 years old.  The bleeding index is 'low risk'.  Positive CHADS2 values include History of HTN.  Negative CHADS2 values include Age > 12 years old.  The start date was 08/31/2005.  Anticoagulation responsible provider: Myrtis Ser MD, Tinnie Gens.  INR POC: 2.7.  Cuvette Lot#: [id number].  Exp: 03/2011.    Anticoagulation Management Assessment/Plan:      The patient's current anticoagulation dose is Warfarin sodium 5 mg tabs: Use as directed by Anticoagulation Clinic.  The target INR is 2.0-3.0.  The next INR is due 02/13/2010.  Anticoagulation instructions were given to patient.  Results were reviewed/authorized by Bethena Midget, RN, BSN.  He was notified by Bethena Midget, RN, BSN.         Prior Anticoagulation Instructions: INR 2.6  Continue taking 0.5 tab each Wednesday and 1 tab on all other days.  Recheck in 4 weeks.    Current Anticoagulation Instructions: INR 2.7 Continue 5mg s everyday except 2.5mg s on Wednesdays. Recheck in 4 weeks.

## 2011-01-01 NOTE — Medication Information (Signed)
Summary: [facility] Griffith   Anticoagulant Therapy  Managed by: Weston Brass, PharmD Referring MD: Willa Rough,  MD PCP: Illene Regulus, MD Supervising MD: Bensimhon MD,Daniel Indication 1: Atrial Fibrillation (ICD-427.31) Lab Used: [facility] Site: [facility] INR POC 2.5 INR RANGE 2 - 3  Dietary changes: no    Health status changes: no    Bleeding/hemorrhagic complications: no    Recent/future hospitalizations: no    Any changes in medication regimen? no    Recent/future dental: no  Any missed doses?: no       Is patient compliant with meds? yes       Allergies: 1)  ! Sulfa  Anticoagulation Management History:      The patient is taking warfarin and comes in today for a routine follow up visit.  Negative risk factors for bleeding include an age less than 50 years old.  The bleeding index is 'low risk'.  Positive CHADS2 values include History of HTN.  Negative CHADS2 values include Age > 56 years old.  The start date was 08/31/2005.  His last INR was 2.2.  Anticoagulation responsible provider: Bensimhon MD,Daniel.  INR POC: 2.5.  Cuvette Lot#: [id number].  Exp: 10/2011.    Anticoagulation Management Assessment/Plan:      The patient's current anticoagulation dose is Warfarin sodium 5 mg tabs: Use as directed by Anticoagulation Clinic.  The target INR is 2.0-3.0.  The next INR is due 01/12/2011.  Anticoagulation instructions were given to patient.  Results were reviewed/authorized by Weston Brass, PharmD.  He was notified by Linward Headland, PharmD candidate.         Prior Anticoagulation Instructions: INR 2.2  Continue taking 1 tablet daily except take 1/2 tablet on Wednesdays.  Return in about 4 weeks on Monday, Jan. 16th at 8:30AM.  Current Anticoagulation Instructions: INR 2.5 (goal INR: 2-3)  Take 1 tablet everyday except 1/2 tablet on Wednesday.  Recheck in 4 weeks.

## 2011-01-01 NOTE — Assessment & Plan Note (Signed)
Summary: cpx / uhc / #/ cd   Vital Signs:  Patient profile:   65 year old male Height:      71 inches Weight:      197 pounds BMI:     27.58 O2 Sat:      98 % on Room air Temp:     97.8 degrees F oral Pulse rate:   52 / minute BP sitting:   118 / 82  (left arm) Cuff size:   regular  Vitals Entered By: Bill Salinas CMA (Apr 01, 2010 10:03 AM)  O2 Flow:  Room air  Primary Care Provider:  Norins   History of Present Illness: The patient continues to have complaints of intermittent urinary frequency and urgency.  He endorses nocturia up to 2-3x a night, although he does sleep through most nights.  These symptoms began about 9 months ago but have worsened again recently.  He endorses mild burning with urination.  He is able to produce a full stream and thinks that he empties his bladder fully, although occasionally he does again feel urgency soon after urinating.    He describes a history of SOB that was intermittent.  He attributes this SOB to a possibly food allergy to whey.  He has stopped eating whey protein bars and this symptom has resolved on its own.  He continues to describe intermittent episodes of atrial fibrillation.  He is concerned by feelings of lightheadedness when he experiences afib while exercising.  He had been prescribed pindolol to use one hour prior to exercise.  He tried this once but experienced negative side effects (tiredness, fatigue, weakness) and discontinued its use.  He is on full anticoagulation with coumadin and inquires as to his candidacy for Pradaxa.    Preventive Screening-Counseling & Management  Alcohol-Tobacco     Smoking Status: never  Caffeine-Diet-Exercise     Does Patient Exercise: yes     Exercise (avg: min/session): 30-60     Times/week: 4  Current Medications (verified): 1)  Lipitor 20 Mg Tabs (Atorvastatin Calcium) .... Take 1 Tablet By Mouth Once A Day 2)  Nexium 40 Mg  Cpdr (Esomeprazole Magnesium) .Marland Loveland.. 1 By Mouth Daily 3)   Warfarin Sodium 5 Mg Tabs (Warfarin Sodium) .... Use As Directed By Anticoagulation Clinic 4)  Adult Aspirin Low Strength 81 Mg  Tbdp (Aspirin) .... Once Daily 5)  Multivitamins   Tabs (Multiple Vitamin) .... Take 1 Tablet By Mouth Once A Day 6)  Ramipril 2.5 Mg Caps (Ramipril) .... Take One Capsule By Mouth Daily 7)  Valtrex 1 Gm Tabs (Valacyclovir Hcl) .... 2 in Am, 2 Pm Single Day Dosing For Hsv  Allergies (verified): 1)  ! Sulfa  Past History:  Past Medical History: Last updated: 01/06/2010 CORONARY ARTERY BYPASS GRAFT, HX OF (ICD-V45.81)...2000 CAD...myoview 1,2009 EF  60-65%...echo..September, 2010 Carotid artery disease..mild...plan f/u 09/2009  /   doppler.Marland Kitchen11/ 2010...0-39% bilateral. follow up  2 years HYPERTENSION (ICD-401.9) HYPERLIPIDEMIA (ICD-272.4)....HDL low GERD (ICD-530.81)/Barrett's esophagus COLONIC POLYPS, HX OF (ICD-V12.72) Atrial fibrillation- s/p ablation..  (flutter ??).Marland KitchenMarland KitchenTaylor Coumadin Rx Osteoarthritis L knee, mild. Chronic lateral foot pain-resolved with use of orthotics '09 Bradycardia Aortic stenosis  ..mild...echo..9/ 2010 AI...mild ...echo...9/ 2010 PVCs Niaspan.... intolerance Fish oil.... did not tolerate the first product tried..... other preparations or Fish oil pill could be considered Bilirubin... mild chronic elevation... 2.0   12/19/2009.. stable  Past Surgical History: Last updated: 03/21/2008 cabg-2000 Tonsillectomy-remotely  Family History: Last updated: 11/04/2007 CAD in 1st degree male relative at  age 68 years Family History Diabetes 1st degree relative Family History High cholesterol Family History Hypertension  Social History: Reviewed history from 03/21/2008 and no changes required. 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 He's nomiated to the [employer] (May '11) Married-'70-13 yrs divorce; married '97 2 daughters-'75, '79; 1 grandchild; step-daughter and step grandson Never  Smoked Alcohol use-yes, 2 glasses wine/day Drug use-no Regular exercise-yes, runs 1.5-2 mi 4x/wk, also eliptical  Physical Exam  General:  Patient is a welldeveloped man in no apparent distress.   Head:  Normocephalic, atraumatic. Eyes:  Pupils are equal, round and reactive to light.   Ears:  R and L ear normal externally and otoscopically.   Neck:  Supple, no thryomegaly or palpable masses.  Chest Wall:  No chest wall deformity. Lungs:  Clear to auscultation bilaterally.  No wheezes or crackles.  Good air movement bilaterally. Heart:  Irregularly irregular.  Normal S1 and S2.  No murmurs, rubs or gallops. Abdomen:  Normoactive bowel sounds.  Abdomen is soft, nontender, nondistended with no guarding.  No masses appreciated.   Prostate:  No gland enlargement, nodules, or asymmetry noted. Pulses:  Bilateral radial and posterior tibial pulses 2+. Extremities:  No pedal edema Neurologic:  Alert and oriented x3.  CNII-XII grossly intact.  Gait normal. Cervical Nodes:  No cervical adenopathy  Labwork 03/25/2010 Tests: (1) Lipid Panel (LIPID)   Cholesterol               117 mg/dL                   8-119     ATP III Classification            Desirable:  < 200 mg/dL                    Borderline High:  200 - 239 mg/dL               High:  > = 240 mg/dL   Triglycerides             51.0 mg/dL                  1.4-782.9     Normal:  <150 mg/dL     Borderline High:  562 - 199 mg/dL   HDL                  [L]  13.08 mg/dL                 >65.78   VLDL Cholesterol          10.2 mg/dL                  4.6-96.2   LDL Cholesterol           72 mg/dL                    9-52  CHO/HDL Ratio:  CHD Risk                             3                    Men          Women     1/2 Average Risk     3.4          3.3  Average Risk          5.0          4.4     2X Average Risk          9.6          7.1     3X Average Risk          15.0          11.0                           Tests: (2) BMP (METABOL)    Sodium                    143 mEq/L                   135-145   Potassium                 4.4 mEq/L                   3.5-5.1   Chloride                  109 mEq/L                   96-112   Carbon Dioxide            30 mEq/L                    19-32   Glucose                   89 mg/dL                    04-54   BUN                       15 mg/dL                    0-98   Creatinine                1.2 mg/dL                   1.1-9.1   Calcium                   9.0 mg/dL                   4.7-82.9   GFR                       65.16 mL/min                >60  Tests: (3) CBC Platelet w/Diff (CBCD)   White Cell Count          5.8 K/uL                    4.5-10.5   Red Cell Count            4.83 Mil/uL                 4.22-5.81   Hemoglobin                14.6 g/dL                   56.2-13.0  Hematocrit                42.7 %                      39.0-52.0   MCV                       88.5 fl                     78.0-100.0   MCHC                      34.2 g/dL                   11.9-14.7   RDW                       14.1 %                      11.5-14.6   Platelet Count            166.0 K/uL                  150.0-400.0   Neutrophil %              51.8 %                      43.0-77.0   Lymphocyte %              27.5 %                      12.0-46.0   Monocyte %           [H]  18.2 %                      3.0-12.0     Rechecked and verified result.   Eosinophils%              1.8 %                       0.0-5.0   Basophils %               0.7 %                       0.0-3.0   Neutrophill Absolute      3.0 K/uL                    1.4-7.7   Lymphocyte Absolute       1.6 K/uL                    0.7-4.0   Monocyte Absolute         1.0 K/uL                    0.1-1.0  Eosinophils, Absolute                             0.1 K/uL                    0.0-0.7   Basophils Absolute        0.0 K/uL  0.0-0.1  Tests: (4) Hepatic/Liver Function Panel (HEPATIC)   Total Bilirubin      [H]   2.0 mg/dL                   1.6-1.0   Direct Bilirubin          0.3 mg/dL                   9.6-0.4   Alkaline Phosphatase      59 U/L                      39-117   AST                       30 U/L                      0-37   ALT                       31 U/L                      0-53   Total Protein             6.1 g/dL                    5.4-0.9   Albumin                   3.8 g/dL                    8.1-1.9  Tests: (5) TSH (TSH)   FastTSH              [H]  5.52 uIU/mL                 0.35-5.50  Tests: (6) UDip Only (UDIP)   Color                     YELLOW       RANGE:  Yellow;Lt. Yellow   Clarity                   CLEAR                       Clear   Specific Gravity          1.020                       1.000 - 1.030   Urine Ph                  6.5                         5.0-8.0   Protein                   NEGATIVE                    Negative   Urine Glucose             NEGATIVE                    Negative   Ketones  NEGATIVE                    Negative   Urine Bilirubin           NEGATIVE                    Negative   Blood                     NEGATIVE                    Negative   Urobilinogen              1.0                         0.0 - 1.0   Leukocyte Esterace        NEGATIVE                    Negative   Nitrite                   NEGATIVE                    Negative  Tests: (7) Prostate Specific Antigen (PSA)   PSA-Hyb                   1.19 ng/mL                  0.10-4.00   Impression & Recommendations:  Problem # 1:  BENIGN PROSTATIC HYPERTROPHY, WITH OBSTRUCTION (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  His PSA is in a normal range and the DRE was unremarkable.  U/A showed no signs of infection.   Plan-Will initiate a trial of tamsulosin and monitor for improvement in symptoms.  Patient was counseled to take the medication at night.  Problem # 2:  ATRIAL FIBRILLATION (ICD-427.31) He continues to have episodic, symptomatic atrial  fibrillation.  He was intolerant of Pindolol for symptom control with exercise.  Plan He will continue on current medications.         Will discuss with his cardiologist, Dr. Myrtis Ser, the risks/benefits of switching from warfarin to Pradaxa.     His updated medication list for this problem includes:    Warfarin Sodium 5 Mg Tabs (Warfarin sodium) ..... Use as directed by anticoagulation clinic    Adult Aspirin Low Strength 81 Mg Tbdp (Aspirin) ..... Once daily  Problem # 3:  BARRETTS ESOPHAGUS (ICD-530.85) He continues to be followed for his diagnosis of Barrett's esophagus with Dr. Christella Hartigan. He reports that Dr. Christella Hartigan feels his esophageal changes are less severe than previously diagnosed by Dr. Corinda Gubler.  He is scheduled for an appointment in a few weeks.    Plan - He will discuss with Dr. Christella Hartigan  the appropriateness of switching from PPI  to a H2 blocker therapy.    Problem # 4:  HYPERTENSION (ICD-401.9)  His blood pressure is at goal at this visit.  His electrolytes are within normal limits.  Will continue current medication.  His updated medication list for this problem includes:    Ramipril 2.5 Mg Caps (Ramipril) .Marland Leth... Take one capsule by mouth daily  BP today: 118/82 Prior BP: 122/62 (01/06/2010)  Labs Reviewed: K+: 4.4 (03/25/2010) Creat: : 1.2 (03/25/2010)   Chol: 117 (03/25/2010)   HDL: 34.50 (03/25/2010)   LDL: 72 (03/25/2010)   TG:  51.0 (03/25/2010)  Problem # 5:  HYPERLIPIDEMIA (ICD-272.4)  Lipids are at goal at this visit.  Will continue current medication.  His updated medication list for this problem includes:    Lipitor 20 Mg Tabs (Atorvastatin calcium) .Marland Ballweg... Take 1 tablet by mouth once a day  Labs Reviewed: SGOT: 30 (03/25/2010)   SGPT: 31 (03/25/2010)   HDL:34.50 (03/25/2010), 31.90 (12/19/2009)  LDL:72 (03/25/2010), 78 (12/19/2009)  Chol:117 (03/25/2010), 125 (12/19/2009)  Trig:51.0 (03/25/2010), 77.0 (12/19/2009)  Problem # 6:  Preventive Health Care  (ICD-V70.0) Patient is due for colonoscopy in 2012.  He continues to be followed by Dr. Christella Hartigan.  PSA is normal at 1.19.  DRE showed no abnormalities.  Patient was given the zostavax at his visit today.  Patient has has his eye exam within the past year with no reported changes.  Will continue to follow with Dr. Bur[place]i.  Problem # 7:  THYROID STIMULATING HORMONE, ABNORMAL (ICD-246.9) His TSH has had upward trend over the past several years (3.74 in 2009, 4.67 in 2010, 5.52 in 2011).  He is currently asymptomatic.  Will recheck TSH in 6 months to monitor for acceleration in change.  Complete Medication List: 1)  Lipitor 20 Mg Tabs (Atorvastatin calcium) .... Take 1 tablet by mouth once a day 2)  Nexium 40 Mg Cpdr (Esomeprazole magnesium) .Marland Mizzell.. 1 by mouth daily 3)  Warfarin Sodium 5 Mg Tabs (Warfarin sodium) .... Use as directed by anticoagulation clinic 4)  Adult Aspirin Low Strength 81 Mg Tbdp (Aspirin) .... Once daily 5)  Multivitamins Tabs (Multiple vitamin) .... Take 1 tablet by mouth once a day 6)  Ramipril 2.5 Mg Caps (Ramipril) .... Take one capsule by mouth daily 7)  Valtrex 1 Gm Tabs (Valacyclovir hcl) .... 2 in am, 2 pm single day dosing for hsv

## 2011-01-01 NOTE — Assessment & Plan Note (Signed)
Summary: L KNEE PAIN,POSSIBLE INJECTION,MC   Vital Signs:  Patient profile:   65 year old male Height:      71 inches Weight:      190 pounds BP sitting:   131 / 79  Vitals Entered By: Russell Griffith CMA (October 02, 2010 11:11 AM)   Referring Arling Cerone:  Russell Griffith   /  EP Primary Russell Griffith:  Russell Regulus, MD   History of Present Illness: Russell Griffith is a 65 yo male patient comingtoday  to F/U on his left knee pain. He had a cortisone shot last january which lasted until now. He had a U/S of his knee done at that visit which showed a calcification in the lateral meniscus.   He started feeling the pain about a week ago mainly after walking 1 mile. The pain is 5/10, located in the lateral aspect of his knee. no radiated. Rest improves the pain. weight bearing activities worsens the pain. He also does the eliptical about 5 days a week with no pain during that exercise. No mechanical symptoms as locking , catching or giving away. No swelling. No hx of trauma. Also complains about right shoulder pain that started about a week ago. he goes to the gym  5 days a week and he is doing heavy lifting. He woke up with the pain. The  pain is located in the posterolateral aspect of his shoulder. Not radiated. Sharp. 5/10. Wakes him at night. The pain improves with resting, and worsens when he tries to raise his shoulder. no numness or tinglin.   Preventive Screening-Counseling & Management  Alcohol-Tobacco     Smoking Status: never  Allergies: 1)  ! Sulfa  Physical Exam  General:  Well-developed,well-nourished,in no acute distress; alert,appropriate and cooperative throughout examination.  MSK U/S of the Right shoulder done which showed:  Swelling and spliting of the biceps tendon.  Partial tear of the supraspinatus tendon. Swelling of the subacromial bursa Calcification of the supraspinatus tendon. Sub acromial impingment of the rotator cuff. Calcification in the [facility] joint. Msk:  Rt shoulder  intact skin, Full ROM flexion with point of maximum tenderness at 90 degrees of flexion, improves after that. Tenderness with abduction at 90 degrees improving below and above that range.  Full ROM with internal and external rotation, adduction and abduction.  Strength 5/5 in flexion, extension,external and internal rotation.  Empty can test neg.  Flexion and Internal rotation impingement test positive.  Mild tenderness rt AC joint. Obrien test negative.  Speeds test negative. Left knee intact skin, no swelling. Full ROM flextion and extension.   Popping sensation lateral to superior pole of knee cap, seems like loose body. No instability. Tenderness to palpation to lateral joint line.  McMurray test neg. Neg Lackman and Appley.   Impression & Recommendations:  Problem # 1:  ROTATOR CUFF INJURY, RIGHT SHOULDER (ICD-726.10)  NTG protocol. Rotator cuff strenghtening exercises. F/U in one month  Orders: [place] LIMITED ([id number])  Problem # 2:  KNEE PAIN, RIGHT (ICD-719.46)   Recomended to use patellofemoral brace with activities Quad strenghtening exercise recomended. Close chain exercise. Continue elliptical. D/C walkin for the present time. Tylenol as needed pain. F/U in 1 month.  His updated medication list for this problem includes:    Adult Aspirin Low Strength 81 Mg Tbdp (Aspirin) ..... Once daily  Complete Medication List: 1)  Lipitor 20 Mg Tabs (Atorvastatin calcium) .... Take 1 tablet by mouth once a day 2)  Nexium 40 Mg Cpdr (Esomeprazole magnesium) .Marland KitchenMarland KitchenMarland Silverio  1 by mouth daily 3)  Warfarin Sodium 5 Mg Tabs (Warfarin sodium) .... Use as directed by anticoagulation clinic 4)  Adult Aspirin Low Strength 81 Mg Tbdp (Aspirin) .... Once daily 5)  Multivitamins Tabs (Multiple vitamin) .... Take 1 tablet by mouth once a day 6)  Ramipril 2.5 Mg Caps (Ramipril) .... Take one capsule by mouth daily 7)  Valtrex 1 Gm Tabs (Valacyclovir hcl) .... 2 in am, 2 pm single day dosing for hsv  prn 8)  Nitroglycerin 0.2 Mg/hr Pt24 (Nitroglycerin) .... Apply 1/4 patch to right shoulder daily Prescriptions: NITROGLYCERIN 0.2 MG/HR PT24 (NITROGLYCERIN) apply 1/4 patch to right shoulder daily  #30 x 1   Entered by:   Rochele Pages RN   Authorized by:   Enid Baas MD   Signed by:   Rochele Pages RN on 10/02/2010   Method used:   Electronically to        [facility]* (retail)       [address]       [place], [place]  [id number]       Ph: [phone]       Fax: [phone]   RxID:   [id number]    Orders Added: 1)  Est. Patient Level IV [69485] 2)  [place] LIMITED [[id number]]

## 2011-01-01 NOTE — Miscellaneous (Signed)
  Clinical Lists Changes  Observations: Added new observation of PAST MED HX: CORONARY ARTERY BYPASS GRAFT, HX OF (ICD-V45.81)...2000 CAD...myoview 1,2009 /  Myoview 02/13/2010... excellent exercise capacity.... hypertensive blood pressure response... no scar or ischemia.. EF 61%..mild palpitations at peak stress were infrequent PACs and PVCs.  He is a EF  60-65%...echo..September, 2010 Carotid artery disease..mild...plan f/u 09/2009  /   doppler.Marland Kitchen11/ 2010...0-39% bilateral. follow up  2 years HYPERTENSION (ICD-401.9)..controlled at rest.... significant hypertension with treadmill HYPERLIPIDEMIA (ICD-272.4)....HDL low GERD (ICD-530.81)/Barrett's esophagus COLONIC POLYPS, HX OF (ICD-V12.72) Atrial fibrillation- s/p ablation..  (flutter ??).Marland KitchenMarland KitchenTaylor Palpitations with exercise.Marland Laurich.2010... we believe he has bursts of atrial fib with exercise, not ventricular arrhythmias. /  pindolol trial given one hour before exercise stopped after one dose.... patient felt poorly with it.. February 26, 2010..  ASSESSMENT: Patient has sinus bradycardia.  Certain meds might lead  to pacemaker.  There is CAD.  Tickosyn trial would require hospitalization and there are side effects.  Amiodarone could lead to pacemaker.Karie Soda is less effective and we chose not to try.  Sotalol could lead to pacemaker.  Patient has not failed multiple drugs, however atrial fibrillation ablation could be considered as it would be low risk for him. Coumadin Rx  Osteoarthritis L knee, mild. Chronic lateral foot pain-resolved with use of orthotics '09 Bradycardia Aortic stenosis  ..mild...echo..9/ 2010 AI...mild ...echo...9/ 2010 PVCs Niaspan.... intolerance Fish oil.... did not tolerate the first product tried..... other preparations or Fish oil pill could be considered Bilirubin... mild chronic elevation... 2.0   12/19/2009.. stable BPH.. with obstruction... Dr.Norins... may come up to zero TSH... trending up.. May, 2011... to be followed  Dr.Norins  (05/16/2010 14:14) Added new observation of REFERRING MD: Ladona Ridgel   /  EP (05/16/2010 14:14) Added new observation of PRIMARY MD: Illene Regulus, MD (05/16/2010 14:14)       Past History:  Past Medical History: CORONARY ARTERY BYPASS GRAFT, HX OF (ICD-V45.81)...2000 CAD...myoview 1,2009 /  Myoview 02/13/2010... excellent exercise capacity.... hypertensive blood pressure response... no scar or ischemia.. EF 61%..mild palpitations at peak stress were infrequent PACs and PVCs.  He is a EF  60-65%...echo..September, 2010 Carotid artery disease..mild...plan f/u 09/2009  /   doppler.Marland Kitchen11/ 2010...0-39% bilateral. follow up  2 years HYPERTENSION (ICD-401.9)..controlled at rest.... significant hypertension with treadmill HYPERLIPIDEMIA (ICD-272.4)....HDL low GERD (ICD-530.81)/Barrett's esophagus COLONIC POLYPS, HX OF (ICD-V12.72) Atrial fibrillation- s/p ablation..  (flutter ??).Marland KitchenMarland KitchenTaylor Palpitations with exercise.Marland Rampersad.2010... we believe he has bursts of atrial fib with exercise, not ventricular arrhythmias. /  pindolol trial given one hour before exercise stopped after one dose.... patient felt poorly with it.. February 26, 2010..  ASSESSMENT: Patient has sinus bradycardia.  Certain meds might lead  to pacemaker.  There is CAD.  Tickosyn trial would require hospitalization and there are side effects.  Amiodarone could lead to pacemaker.Karie Soda is less effective and we chose not to try.  Sotalol could lead to pacemaker.  Patient has not failed multiple drugs, however atrial fibrillation ablation could be considered as it would be low risk for him. Coumadin Rx  Osteoarthritis L knee, mild. Chronic lateral foot pain-resolved with use of orthotics '09 Bradycardia Aortic stenosis  ..mild...echo..9/ 2010 AI...mild ...echo...9/ 2010 PVCs Niaspan.... intolerance Fish oil.... did not tolerate the first product tried..... other preparations or Fish oil pill could be considered Bilirubin... mild  chronic elevation... 2.0   12/19/2009.. stable BPH.. with obstruction... Dr.Norins... may come up to zero TSH... trending up.. May, 2011... to be followed Dr.Norins

## 2011-01-01 NOTE — Medication Information (Signed)
Summary: rov/tm  Anticoagulant Therapy  Managed by: Eda Keys, PharmD Referring MD: Willa Rough,  MD PCP: Link Snuffer MD: Daleen Squibb MD, Maisie Fus Indication 1: Atrial Fibrillation (ICD-427.31) Lab Used: [facility] Site: [facility] INR POC 2.6 INR RANGE 2 - 3  Dietary changes: no    Health status changes: no    Bleeding/hemorrhagic complications: no    Recent/future hospitalizations: no    Any changes in medication regimen? no    Recent/future dental: no  Any missed doses?: no       Is patient compliant with meds? yes       Allergies: 1)  ! Sulfa  Anticoagulation Management History:      The patient is taking warfarin and comes in today for a routine follow up visit.  Negative risk factors for bleeding include an age less than 51 years old.  The bleeding index is 'low risk'.  Positive CHADS2 values include History of HTN.  Negative CHADS2 values include Age > 40 years old.  The start date was 08/31/2005.  Anticoagulation responsible provider: Daleen Squibb MD, Maisie Fus.  INR POC: 2.6.  Cuvette Lot#: [id number].  Exp: 05/2011.    Anticoagulation Management Assessment/Plan:      The patient's current anticoagulation dose is Warfarin sodium 5 mg tabs: Use as directed by Anticoagulation Clinic.  The target INR is 2.0-3.0.  The next INR is due 04/29/2010.  Anticoagulation instructions were given to patient.  Results were reviewed/authorized by Eda Keys, PharmD.  He was notified by Eda Keys.         Prior Anticoagulation Instructions: INR 2.4 Continue 5mg s daily except 2.5mg s on Wednesdays. Recheck in 4 weeks.   Current Anticoagulation Instructions: INR 2.6  Continue taking 1/2 tablet on Wednesday and 1 tablet all other days.  Return to clinic in 4 weeks.

## 2011-01-01 NOTE — Letter (Signed)
Summary: [facility] Instructions  [facility]  [address] [place], [place] [address]   Phone: [phone]  Fax: [phone]       Russell Griffith    May 09, 1946    MRN: [id number]        Procedure Day Dorna Bloom:  [name] Shark  04/29/10     Arrival Time:   12:30pm     Procedure Time:  1:30pm     Location of Procedure:                    _ X_  [facility] (4th Floor)                        PREPARATION FOR COLONOSCOPY /ENDO  WITH MOVIPREP   Starting 5 days prior to your procedure  THURSDAY 05/26  do not eat nuts, seeds, popcorn, corn, beans, peas,  salads, or any raw vegetables.  Do not take any fiber supplements (e.g. Metamucil, Citrucel, and Benefiber).  THE DAY BEFORE YOUR PROCEDURE         DATE: MONDAY  05/30  1.  Drink clear liquids the entire day-NO SOLID FOOD  2.  Do not drink anything colored red or purple.  Avoid juices with pulp.  No orange juice.  3.  Drink at least 64 oz. (8 glasses) of fluid/clear liquids during the day to prevent dehydration and help the prep work efficiently.  CLEAR LIQUIDS INCLUDE: Water Jello Ice Popsicles Tea (sugar ok, no milk/cream) Powdered fruit flavored drinks Coffee (sugar ok, no milk/cream) Gatorade Juice: apple, white grape, white cranberry  Lemonade Clear bullion, consomm, broth Carbonated beverages (any kind) Strained chicken noodle soup Hard Candy                             4.  In the morning, mix first dose of MoviPrep solution:    Empty 1 Pouch A and 1 Pouch B into the disposable container    Add lukewarm drinking water to the top line of the container. Mix to dissolve    Refrigerate (mixed solution should be used within 24 hrs)  5.  Begin drinking the prep at 5:00 p.m. The MoviPrep container is divided by 4 marks.   Every 15 minutes drink the solution down to the next mark (approximately 8 oz) until the full liter is complete.   6.  Follow completed prep with 16 oz of clear liquid of your  choice (Nothing red or purple).  Continue to drink clear liquids until bedtime.  7.  Before going to bed, mix second dose of MoviPrep solution:    Empty 1 Pouch A and 1 Pouch B into the disposable container    Add lukewarm drinking water to the top line of the container. Mix to dissolve    Refrigerate  THE DAY OF YOUR PROCEDURE      DATE: TUESDAY  05/31  Beginning at  8:30 a.m. (5 hours before procedure):         1. Every 15 minutes, drink the solution down to the next mark (approx 8 oz) until the full liter is complete.  2. Follow completed prep with 16 oz. of clear liquid of your choice.    3. You may drink clear liquids until  11:30am  (2 HOURS BEFORE PROCEDURE).   MEDICATION INSTRUCTIONS  Unless otherwise instructed, you should take regular prescription medications with a small sip of water  as early as possible the morning of your procedure.   Stop taking Coumadin on   04-24-10  (5 days before procedure).           OTHER INSTRUCTIONS  You will need a responsible adult at least 63 years of age to accompany you and drive you home.   This person must remain in the waiting room during your procedure.  Wear loose fitting clothing that is easily removed.  Leave jewelry and other valuables at home.  However, you may wish to bring a book to read or  an iPod/MP3 player to listen to music as you wait for your procedure to start.  Remove all body piercing jewelry and leave at home.  Total time from sign-in until discharge is approximately 2-3 hours.  You should go home directly after your procedure and rest.  You can resume normal activities the  day after your procedure.  The day of your procedure you should not:   Drive   Make legal decisions   Operate machinery   Drink alcohol   Return to work  You will receive specific instructions about eating, activities and medications before you leave.    The above instructions have been reviewed and explained to me  by   Karl Bales RN  Apr 24, 2010 2:22 PM     I fully understand and can verbalize these instructions _____________________________ Date _________

## 2011-01-01 NOTE — Procedures (Signed)
Summary: Upper Endoscopy  Patient: Russell Griffith Note: All result statuses are Final unless otherwise noted.  Tests: (1) Upper Endoscopy (EGD)   EGD Upper Endoscopy       DONE     [facility]     [address]     [place], [place]  [address]           ENDOSCOPY PROCEDURE REPORT           PATIENT:  Russell Griffith, Russell Griffith  MR#:  [id number]     BIRTHDATE:  1945-12-28, 64 yrs. old  GENDER:  male     ENDOSCOPIST:  Rachael Fee, MD     PROCEDURE DATE:  04/29/2010     PROCEDURE:  EGD with biopsy     ASA CLASS:  Class II     INDICATIONS:  Barrett's surveillance (short segment)     MEDICATIONS:  Fentanyl 25 mcg IV, Versed 3 mg IV, There was     residual sedation effect present from prior procedure.     TOPICAL ANESTHETIC:  none     DESCRIPTION OF PROCEDURE:   After the risks benefits and     alternatives of the procedure were thoroughly explained, informed     consent was obtained.  The LB GIF-H180 [id number] endoscope was     introduced through the mouth and advanced to the second portion of     the duodenum, without limitations.  The instrument was slowly     withdrawn as the mucosa was fully examined.     <<PROCEDUREIMAGES>>           Barrett's esophagus was found. There was a short segment of     Barrett's appearing mucosa without nodularity. This was 1-2cm.     Several biopsies were taken and sent to pathology (jar 2). There     was a 2cm hiatal hernia. The examination was otherwise normal (see     image1, image6, image5, image4, image3, and image2).     Retroflexed views revealed no abnormalities.    The scope was then     withdrawn from the patient and the procedure completed.           COMPLICATIONS:  None           ENDOSCOPIC IMPRESSION:     1) Non-nodular, short segment Barrett's appearing mucosa     (previously proven to have intestinal metaplasia without     dysplasia);  biopsies taken and sent to pathology     2) Smal hiatal hernia           RECOMMENDATIONS:  Await biopsies to determine interval to next surveillance     endoscopy (likely in 2 1/2 years which would be half-way until his     next likely colonoscopy for polyp surveillance in 5 years).           ______________________________     Rachael Fee, MD           n.     eSIGNED:   Rachael Fee at 04/29/2010 01:55 PM           Mckinley Jewel, [id number]  Note: An exclamation mark (!) indicates a result that was not dispersed into the flowsheet. Document Creation Date: 04/29/2010 1:56 PM _______________________________________________________________________  (1) Order result status: Final Collection or observation date-time: 04/29/2010 13:49 Requested date-time:  Receipt date-time:  Reported date-time:  Referring Physician:   Ordering Physician: Rob Bunting [id number]) Specimen Source:  Source: Launa Grill  Order Number: [id number] Lab site:   Appended Document: Upper Endoscopy recall     Procedures Next Due Date:    EGD: 09/2012

## 2011-01-01 NOTE — Assessment & Plan Note (Signed)
Summary: PER CHECK OUT/SF      Allergies Added:   Visit Type:  Follow-up Referring Provider:  Ladona Ridgel   /  EP Primary Provider:  Norins   History of Present Illness: The patient is seen for followup of coronary disease and atrial fibrillation.  He is doing very well.  He exercises regularly.  On an infrequent basis, he has increased heart rate in the middle of his exercise program.  All of our data up to this point suggest that he is having atrial fib at that time.  He is able to continue his exercise.  He does not have chest pain.  The rhythm resolved after his exercises complete. During his exercise program, he has had rare vague chest discomfort.  Current Medications (verified): 1)  Lipitor 20 Mg Tabs (Atorvastatin Calcium) .... Take 1 Tablet By Mouth Once A Day 2)  Nexium 40 Mg  Cpdr (Esomeprazole Magnesium) .Marland Yorio.. 1 By Mouth Daily 3)  Warfarin Sodium 5 Mg Tabs (Warfarin Sodium) .... Use As Directed By Anticoagulation Clinic 4)  Adult Aspirin Low Strength 81 Mg  Tbdp (Aspirin) .... Once Daily 5)  Multivitamins   Tabs (Multiple Vitamin) .... Take 1 Tablet By Mouth Once A Day 6)  Ramipril 2.5 Mg Caps (Ramipril) .... Take One Capsule By Mouth Daily 7)  Valtrex 1 Gm Tabs (Valacyclovir Hcl) .... 2 in Am, 2 Pm Single Day Dosing For Hsv  Allergies (verified): 1)  ! Sulfa  Past History:  Past Medical History: CORONARY ARTERY BYPASS GRAFT, HX OF (ICD-V45.81)...2000 CAD...myoview 1,2009 EF  60-65%...echo..September, 2010 Carotid artery disease..mild...plan f/u 09/2009  /   doppler.Marland Kitchen11/ 2010...0-39% bilateral. follow up  2 years HYPERTENSION (ICD-401.9) HYPERLIPIDEMIA (ICD-272.4)....HDL low GERD (ICD-530.81)/Barrett's esophagus COLONIC POLYPS, HX OF (ICD-V12.72) Atrial fibrillation- s/p ablation..  (flutter ??).Marland KitchenMarland KitchenTaylor Coumadin Rx Osteoarthritis L knee, mild. Chronic lateral foot pain-resolved with use of orthotics '09 Bradycardia Aortic stenosis  ..mild...echo..9/ 2010 AI...mild  ...echo...9/ 2010 PVCs Niaspan.... intolerance Fish oil.... did not tolerate the first product tried..... other preparations or Fish oil pill could be considered Bilirubin... mild chronic elevation... 2.0   12/19/2009.. stable  Review of Systems       Patient denies fever, chills, headache, sweats, rash, change in vision, change in hearing, cough, shortness of breath, cough, nausea or vomiting, urinary symptoms.  All other systems are reviewed and are negative.  Vital Signs:  Patient profile:   65 year old male Height:      73 inches Weight:      194 pounds BMI:     25.69 Pulse rate:   55 / minute BP sitting:   122 / 62  (left arm) Cuff size:   regular  Vitals Entered By: Hardin Negus, RMA (January 06, 2010 9:40 AM)  Physical Exam  General:  patient appears quite stable. Head:  head is atraumatic. Eyes:  no xanthelasma. Neck:  question soft right carotid bruit. Chest Wall:  no chest wall tenderness. Lungs:  lungs are clear.  Respiratory effort is nonlabored. Heart:  cardiac exam reveals S1 and S2.  No clicks or significant murmurs. Abdomen:  abdomen is soft. Msk:  no musculoskeletal deformities. Extremities:  no peripheral edema. Skin:  no skin rashes. Psych:  patient is oriented to person time and place.  Affect is normal.   Impression & Recommendations:  Problem # 1:  * BILIRUBIN... MILD ELEVATION.Marland Rahn CHRONIC Most recent bilirubin is 2.0.. he has mild chronic elevation and this is stable.  Problem # 2:  * BRUIT  The patient has a soft right carotid bruit.  However his most recent carotid Dopplers look quite good.  No further workup needed  Problem # 3:  BRADYCARDIA (ICD-427.89) Patient does not have symptomatic bradycardia.  See discussion under atrial fibrillation about the approach to his bradycardia.  Problem # 4:  CAD (ICD-414.00)  His updated medication list for this problem includes:    Warfarin Sodium 5 Mg Tabs (Warfarin sodium) ..... Use as directed by  anticoagulation clinic    Adult Aspirin Low Strength 81 Mg Tbdp (Aspirin) ..... Once daily    Ramipril 2.5 Mg Caps (Ramipril) .Marland Leske... Take one capsule by mouth daily  Orders: Nuclear Stress Test (Nuc Stress Test)  The patient is exercising regularly.  His last exercise study was done over 2 years ago.  He's had some vague chest discomfort when exercising.  We will proceed with a full stress Myoview scan at this time.  Problem # 5:  ATRIAL FIBRILLATION (ICD-427.31) I have carefully reviewed the palpitations that the patient has when he is exercising.  This is fairly infrequent.  In prior notes I have outlined the fact that we believe that he is having bursts of atrial fibrillation.  Analysis has shown that there is no evidence that he is having ventricular arrhythmias.  I reviewed the overall situation at length in past months with Dr.Taylor.  The patient has sinus bradycardia.  Certain medicines might push towards need for pacemaker so we are very careful in this regard.  Patient has known coronary disease and this limits somewhat the choice of medications.  Tickosyn could be used, but this would require hospitalization and the drug has side effects.  Amiodarone would probably lead to bradycardia and possibly need for pacemaker.  Multaq is less effective and we decided not to consider it.  Sotalol might be a good choice but it would probably lead to bradycardia and possible need for pacemaker.  The patient has not "failed multiple drugs."  However he could still be considered for atrial fibrillation.  This would be a low risk procedure for him. Considering all of the options the patient is still stable at this time.  We will continue to follow him carefully but not make any changes as of today.  Patient Instructions: 1)  Your physician has requested that you have an exercise stress myoview.  For further information please visit [email or web address]  Please follow instruction sheet, as given. 2)  Follow  up in 6 months

## 2011-01-01 NOTE — Medication Information (Signed)
Summary: Russell Griffith   Anticoagulant Therapy  Managed by: Reina Fuse, PharmD Referring MD: Willa Rough,  MD PCP: Illene Regulus, MD Supervising MD: Antoine Poche MD, Fayrene Fearing Indication 1: Atrial Fibrillation (ICD-427.31) Lab Used: [facility] Site: [facility] INR POC 2.8 INR RANGE 2 - 3  Dietary changes: no    Health status changes: no    Bleeding/hemorrhagic complications: no    Recent/future hospitalizations: no    Any changes in medication regimen? no    Recent/future dental: no  Any missed doses?: no       Is patient compliant with meds? yes       Allergies: 1)  ! Sulfa  Anticoagulation Management History:      The patient is taking warfarin and comes in today for a routine follow up visit.  Negative risk factors for bleeding include an age less than 65 years old.  The bleeding index is 'low risk'.  Positive CHADS2 values include History of HTN.  Negative CHADS2 values include Age > 50 years old.  The start date was 08/31/2005.  Anticoagulation responsible provider: Antoine Poche MD, Fayrene Fearing.  INR POC: 2.8.  Cuvette Lot#: [id number].  Exp: 08/31/2011.    Anticoagulation Management Assessment/Plan:      The patient's current anticoagulation dose is Warfarin sodium 5 mg tabs: Use as directed by Anticoagulation Clinic.  The target INR is 2.0-3.0.  The next INR is due 08/21/2010.  Anticoagulation instructions were given to patient.  Results were reviewed/authorized by Reina Fuse, PharmD.  He was notified by Reina Fuse PharmD.         Prior Anticoagulation Instructions: Continue same dose: 5mg  daily except 2.5mg  on Wednesdays.  Current Anticoagulation Instructions: INR 2.8  Continue taking Coumadin 1 tab (5 mg) on all days except for Coumadin 0.5 tab (2.5 mg) on Wednesdays. Return to clinic in 4 weeks.

## 2011-01-01 NOTE — Progress Notes (Signed)
----   Converted from flag ---- ---- 04/15/2010 10:14 AM, Talitha Givens, MD, [facility] wrote: Yes,  He is cleared with no bridging.   Myrtis Ser   ---- 04/14/2010 2:25 PM, Bethena Midget, RN, BSN wrote: Pt telephoned today and states he's having a Colonoscopy on 04/29/10 with Dr Christella Hartigan need to take last dose on 04/23/10. Is he  cleared to stop without bridging? ------------------------------

## 2011-01-01 NOTE — Miscellaneous (Signed)
Summary: [facility] previsit  Clinical Lists Changes  Medications: Added new medication of MOVIPREP 100 GM  SOLR (PEG-KCL-NACL-NASULF-NA ASC-C) As per prep instructions. - Signed Rx of MOVIPREP 100 GM  SOLR (PEG-KCL-NACL-NASULF-NA ASC-C) As per prep instructions.;  #1 x 0;  Signed;  Entered by: Karl Bales RN;  Authorized by: Rachael Fee MD;  Method used: Electronically to [facility]*, [address] [place], [place]  [id number], Ph: [phone], Fax: [phone]    Prescriptions: MOVIPREP 100 GM  SOLR (PEG-KCL-NACL-NASULF-NA ASC-C) As per prep instructions.  #1 x 0   Entered by:   Karl Bales RN   Authorized by:   Rachael Fee MD   Signed by:   Karl Bales RN on 04/24/2010   Method used:   Electronically to        [facility]* (retail)       [address]       [place], [place]  [id number]       Ph: [phone]       Fax: [phone]   RxID:   [id number]

## 2011-01-01 NOTE — Medication Information (Signed)
Summary: rov/sp  Anticoagulant Therapy  Managed by: Charolotte Eke, PharmD Referring MD: Willa Rough,  MD PCP: Illene Regulus, MD Supervising MD: Gala Romney MD, Reuel Boom Indication 1: Atrial Fibrillation (ICD-427.31) Lab Used: [facility] Site: [facility] INR POC 2.5 INR RANGE 2 - 3  Dietary changes: no    Health status changes: no    Bleeding/hemorrhagic complications: no    Recent/future hospitalizations: no    Any changes in medication regimen? no    Recent/future dental: no  Any missed doses?: no       Is patient compliant with meds? yes       Current Medications (verified): 1)  Lipitor 20 Mg Tabs (Atorvastatin Calcium) .... Take 1 Tablet By Mouth Once A Day 2)  Nexium 40 Mg  Cpdr (Esomeprazole Magnesium) .Marland Cadmus.. 1 By Mouth Daily 3)  Warfarin Sodium 5 Mg Tabs (Warfarin Sodium) .... Use As Directed By Anticoagulation Clinic 4)  Adult Aspirin Low Strength 81 Mg  Tbdp (Aspirin) .... Once Daily 5)  Multivitamins   Tabs (Multiple Vitamin) .... Take 1 Tablet By Mouth Once A Day 6)  Ramipril 2.5 Mg Caps (Ramipril) .... Take One Capsule By Mouth Daily 7)  Valtrex 1 Gm Tabs (Valacyclovir Hcl) .... 2 in Am, 2 Pm Single Day Dosing For Hsv Prn  Allergies (verified): 1)  ! Sulfa  Anticoagulation Management History:      The patient is taking warfarin and comes in today for a routine follow up visit.  Negative risk factors for bleeding include an age less than 8 years old.  The bleeding index is 'low risk'.  Positive CHADS2 values include History of HTN.  Negative CHADS2 values include Age > 57 years old.  The start date was 08/31/2005.  Anticoagulation responsible provider: Cyrus Ramsburg MD, Reuel Boom.  INR POC: 2.5.  Cuvette Lot#: [id number].  Exp: 08/31/2011.    Anticoagulation Management Assessment/Plan:      The patient's current anticoagulation dose is Warfarin sodium 5 mg tabs: Use as directed by Anticoagulation Clinic.  The target INR is 2.0-3.0.  The next INR is due 07/24/2010.   Anticoagulation instructions were given to patient.  Results were reviewed/authorized by Charolotte Eke, PharmD.  He was notified by Charolotte Eke, PharmD.         Prior Anticoagulation Instructions: INR 2.2  Continue same dose of 1 tablet every day except 1/2 tablet on Wednesday.   Current Anticoagulation Instructions: Continue same dose: 5mg  daily except 2.5mg  on Wednesdays.

## 2011-01-01 NOTE — Letter (Signed)
Summary: Custom - Lipid  [facility], Main Office  [address]   [place], [place] [address]   Phone: [phone]  Fax: [phone]     December 16, 2010 MRN: [id number]   [facility] [address] [place], [place]  [address]   Dear Mr. Certain,  We have reviewed your cholesterol results.  They are as follows:     Total Cholesterol:    116 (Desirable: less than 200)       HDL  Cholesterol:     29.40  (Desirable: greater than 40 for men and 50 for women)       LDL Cholesterol:       78  (Desirable: less than 100 for low risk and less than 70 for moderate to high risk)       Triglycerides:       43.0  (Desirable: less than 150)  Our recommendations include:  Looks Good, continue current medications.   Call our office at the number listed above if you have any questions.  Lowering your LDL cholesterol is important, but it is only one of a large number of "risk factors" that may indicate that you are at risk for heart disease, stroke or other complications of hardening of the arteries.  Other risk factors include:   A.  Cigarette Smoking* B.  High Blood Pressure* C.  Obesity* D.   Low HDL Cholesterol (see yours above)* E.   Diabetes Mellitus (higher risk if your is uncontrolled) F.  Family history of premature heart disease G.  Previous history of stroke or cardiovascular disease    *These are risk factors YOU HAVE CONTROL OVER.  For more information, visit .  There is now evidence that lowering the TOTAL CHOLESTEROL AND LDL CHOLESTEROL can reduce the risk of heart disease.  The American Heart Association recommends the following guidelines for the treatment of elevated cholesterol:  1.  If there is now current heart disease and less than two risk factors, TOTAL CHOLESTEROL should be less than 200 and LDL CHOLESTEROL should be less than 100. 2.  If there is current heart disease or two or more risk factors, TOTAL CHOLESTEROL should be less than 200 and  LDL CHOLESTEROL should be less than 70.  A diet low in cholesterol, saturated fat, and calories is the cornerstone of treatment for elevated cholesterol.  Cessation of smoking and exercise are also important in the management of elevated cholesterol and preventing vascular disease.  Studies have shown that 30 to 60 minutes of physical activity most days can help lower blood pressure, lower cholesterol, and keep your weight at a healthy level.  Drug therapy is used when cholesterol levels do not respond to therapeutic lifestyle changes (smoking cessation, diet, and exercise) and remains unacceptably high.  If medication is started, it is important to have you levels checked periodically to evaluate the need for further treatment options.  Thank you,    [facility] [employer] Team

## 2011-01-01 NOTE — Miscellaneous (Signed)
  Clinical Lists Changes  Observations: Added new observation of REFERRING MD: Ladona Ridgel   /  EP (01/05/2010 10:38) Added new observation of PAST MED HX: CORONARY ARTERY BYPASS GRAFT, HX OF (ICD-V45.81)...2000 CAD...myoview 1,2009 EF  60-65%...echo..September, 2010 Carotid artery disease..mild...plan f/u 09/2009  /   doppler.Marland Kitchen11/ 2010...0-39% bilateral. follow up  2 years HYPERTENSION (ICD-401.9) HYPERLIPIDEMIA (ICD-272.4) GERD (ICD-530.81)/Barrett's esophagus COLONIC POLYPS, HX OF (ICD-V12.72) Atrial fibrillation- s/p ablation.Marland KitchenMarland KitchenTaylor Coumadin Rx Osteoarthritis L knee, mild. Chronic lateral foot pain-resolved with use of orthotics '09 Bradycardia Aortic stenosis  ..mild...echo..9/ 2010 AI...mild ...echo...9/ 2010 PVCs   (01/05/2010 10:38) Added new observation of PRIMARY MD: Norins (01/05/2010 10:38)       Past History:  Past Medical History: CORONARY ARTERY BYPASS GRAFT, HX OF (ICD-V45.81)...2000 CAD...myoview 1,2009 EF  60-65%...echo..September, 2010 Carotid artery disease..mild...plan f/u 09/2009  /   doppler.Marland Kitchen11/ 2010...0-39% bilateral. follow up  2 years HYPERTENSION (ICD-401.9) HYPERLIPIDEMIA (ICD-272.4) GERD (ICD-530.81)/Barrett's esophagus COLONIC POLYPS, HX OF (ICD-V12.72) Atrial fibrillation- s/p ablation.Marland KitchenMarland KitchenTaylor Coumadin Rx Osteoarthritis L knee, mild. Chronic lateral foot pain-resolved with use of orthotics '09 Bradycardia Aortic stenosis  ..mild...echo..9/ 2010 AI...mild ...echo...9/ 2010 PVCs

## 2011-01-01 NOTE — Assessment & Plan Note (Signed)
Summary: CORT SHOT IN R KNEE,MC   Vital Signs:  Patient profile:   65 year old male BP sitting:   107 / 62  Vitals Entered By: Lillia Pauls CMA (December 03, 2009 8:44 AM)  Primary Provider:  Norins   History of Present Illness: Pt presents with right medial knee pain after working out on a treadmill over the holiday break. He usually is able to use the ellipical machine without any difficulty but the gym that he went to in [place] did not have a treadmill. He walked at a fast pace for 45 minutes. No pain during the activity but significant pain that night which woke him up. Denies any abnormal steps or injurires. Pain was better on the 2nd day and even more improved the 3rd day. Now feeling better but still having aching symptoms with long distance walking but overall is much better. Can use the elliptical now without pain. On a blood thinner so cannot take NSAIDs. Has taken Tylenol which helped with his symptoms.    Allergies: 1)  ! Sulfa  Physical Exam  General:  Knee: RIGHT Normal to inspection with no erythema or obvious bony abnormalities. Very mild medial effusion. Palpation shows pain along the medial joint line reproducing his pain. Minimal warmth. No patellar tenderness or condyle tenderness. ROM normal in flexion and extension and lower leg rotation. Ligaments with solid consistent endpoints including ACL, PCL, LCL, MCL. Negative Mcmurray's and provocative meniscal tests. Non painful patellar compression. Patellar and quadriceps tendons unremarkable. Hamstring and quadriceps strength is normal.   Knee: LEFT Normal to inspection with no erythema or effusion or obvious bony abnormalities. Palpation normal with no warmth or joint line tenderness or patellar tenderness or condyle tenderness. ROM normal in flexion and extension and lower leg rotation. Ligaments with solid consistent endpoints including ACL, PCL, LCL, MCL. Negative Mcmurray's and provocative meniscal  tests. Non painful patellar compression. Patellar and quadriceps tendons unremarkable. Hamstring and quadriceps strength is normal.    Additional Exam:  U/S of his right medial knee shows very mild fluid and an old meniscus tear with calcification. Patellar tendon is normal without signs of a tear. He has a bony spur coming off of his distal patella. 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 Images saved for documentation.    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 1. Will hold off on a steroid injection since his pain is getting better. 2. Work out on a stationary bike to build up quadricep strength 3. Modify activities to avoid pain 4. Given a Don Joy brace to wear for the next 4-6 weeks while walking long distances or when having pain 5. Return in 3-4 weeks if still having pain for a steroid injection.  His updated medication list for this problem includes:    Adult Aspirin Low Strength 81 Mg Tbdp (Aspirin) ..... 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 See above for treatment.   Complete Medication List: 1)  Lipitor 20 Mg Tabs (Atorvastatin calcium) .... Take 1 tablet by mouth once a day 2)  Nexium 40 Mg Cpdr (Esomeprazole  magnesium) .Marland Lomba.. 1 by mouth daily 3)  Warfarin Sodium 5 Mg Tabs (Warfarin sodium) .... Use as directed by anticoagulation clinic 4)  Adult Aspirin Low Strength 81 Mg Tbdp (Aspirin) .... Once daily 5)  Multivitamins Tabs (Multiple vitamin) .... Take 1 tablet by mouth once a day 6)  Ramipril 2.5 Mg Caps (Ramipril) .... Take one capsule by mouth daily 7)   Valtrex 1 Gm Tabs (Valacyclovir hcl) .... 2 in am, 2 pm single day dosing for hsv

## 2011-01-01 NOTE — Letter (Signed)
Summary: Endoscopy- Changed to Office Visit  [facility]  [address] [place], [place] [address]   Phone: [phone]  Fax: [phone]      March 06, 2010 MRN: [id number]   [facility] [address] [place], [place]  [address]   Dear Mr. Nurse,   According to our records, it is time for you to schedule an Endoscopy. However, after reviewing your medical record, I feel that an office visit would be most appropriate to more completely evaluate you and determine your need for a repeat procedure.  Please call [phone] (option #2) at your convenience to schedule an office visit. If you have any questions, concerns, or feel that this letter is in error, we would appreciate your call.   Sincerely,  Rachael Fee, M.D.  [facility] [phone]

## 2011-01-01 NOTE — Progress Notes (Signed)
Summary: clearance for colonoscopy   ---- Converted from flag ---- ---- 04/14/2010 2:25 PM, Bethena Midget, RN, BSN wrote: Pt telephoned today and states he's having a Colonoscopy on 04/29/10 with Dr Christella Hartigan need to take last dose on 04/23/10. Is he  cleared to stop without bridging? ------------------------------  Phone Note Other Incoming   Summary of Call: will send mess to Dr Boykin Nearing, RN  Apr 16, 2010 8:48 AM   Follow-up for Phone Call        He is cleared with no bridging  Talitha Givens, MD, [facility]  Apr 16, 2010 12:01 PM  mess sent to cvrr Meredith Staggers, RN  Apr 16, 2010 12:06 PM   Additional Follow-up for Phone Call Additional follow up Details #1::        [facility] for pt with the above information Additional Follow-up by: Bethena Midget, RN, BSN,  Apr 16, 2010 2:43 PM

## 2011-01-01 NOTE — Progress Notes (Signed)
Summary: calling re lab results   Phone Note Call from Patient   Caller: Patient [phone] Reason for Call: Talk to Nurse Summary of Call: pt calling re lab results Initial call taken by: Glynda Jaeger,  December 19, 2010 1:28 PM  Follow-up for Phone Call        Left message to call back Meredith Staggers, RN  December 19, 2010 1:52 PM  pt aware of results Meredith Staggers, RN  December 19, 2010 2:02 PM

## 2011-01-01 NOTE — Medication Information (Signed)
Summary: rov/jm      Allergies Added:  Anticoagulant Therapy  Managed by: Reina Fuse, PharmD Referring MD: Willa Rough,  MD PCP: Illene Regulus, MD Supervising MD: Gala Romney MD, Reuel Boom Indication 1: Atrial Fibrillation (ICD-427.31) Lab Used: [facility] Site: [facility] INR POC 2.5 INR RANGE 2 - 3  Dietary changes: no    Health status changes: no    Bleeding/hemorrhagic complications: no    Recent/future hospitalizations: no    Any changes in medication regimen? no    Recent/future dental: no  Any missed doses?: yes     Details: One missed dose about 10 days ago.  Is patient compliant with meds? yes       Current Medications (verified): 1)  Lipitor 20 Mg Tabs (Atorvastatin Calcium) .... Take 1 Tablet By Mouth Once A Day 2)  Nexium 40 Mg  Cpdr (Esomeprazole Magnesium) .Marland Hanford.. 1 By Mouth Daily 3)  Warfarin Sodium 5 Mg Tabs (Warfarin Sodium) .... Use As Directed By Anticoagulation Clinic 4)  Adult Aspirin Low Strength 81 Mg  Tbdp (Aspirin) .... Once Daily 5)  Multivitamins   Tabs (Multiple Vitamin) .... Take 1 Tablet By Mouth Once A Day 6)  Ramipril 2.5 Mg Caps (Ramipril) .... Take One Capsule By Mouth Daily 7)  Valtrex 1 Gm Tabs (Valacyclovir Hcl) .... 2 in Am, 2 Pm Single Day Dosing For Hsv Prn  Allergies (verified): 1)  ! Sulfa  Anticoagulation Management History:      The patient is taking warfarin and comes in today for a routine follow up visit.  Negative risk factors for bleeding include an age less than 22 years old.  The bleeding index is 'low risk'.  Positive CHADS2 values include History of HTN.  Negative CHADS2 values include Age > 12 years old.  The start date was 08/31/2005.  Anticoagulation responsible provider: Bensimhon MD, Reuel Boom.  INR POC: 2.5.  Cuvette Lot#: [id number].  Exp: 10/2011.    Anticoagulation Management Assessment/Plan:      The patient's current anticoagulation dose is Warfarin sodium 5 mg tabs: Use as directed by Anticoagulation Clinic.   The target INR is 2.0-3.0.  The next INR is due 10/16/2010.  Anticoagulation instructions were given to patient.  Results were reviewed/authorized by Reina Fuse, PharmD.  He was notified by Reina Fuse PharmD.         Prior Anticoagulation Instructions: INR 2.4  Continue taking one tablet every day except for one-half tablet on Wednesday.  We will see you in four weeks.   Current Anticoagulation Instructions: INR 2.5  Continue taking Coumadin 5 mg (1 tab) all days except Coumadin 2.5 mg (0.5 tab) on Wednesdays.  Return to clinic in 4 weeks.

## 2011-01-01 NOTE — Procedures (Signed)
Summary: Colonoscopy  Patient: Russell Griffith Note: All result statuses are Final unless otherwise noted.  Tests: (1) Colonoscopy (COL)   COL Colonoscopy           DONE     [facility]     [address]     [place], [place]  [address]           COLONOSCOPY PROCEDURE REPORT           PATIENT:  Russell Griffith, Russell Griffith  MR#:  [id number]     BIRTHDATE:  [birth year], 64 yrs. old  GENDER:  male     ENDOSCOPIST:  Rachael Fee, MD     PROCEDURE DATE:  04/29/2010     PROCEDURE:  Colonoscopy with snare polypectomy     ASA CLASS:  Class II     INDICATIONS:  history of pre-cancerous (adenomatous) colon polyps,     screening     MEDICATIONS:   Fentanyl 50 mcg IV, Versed 5 mg IV           DESCRIPTION OF PROCEDURE:   After the risks benefits and     alternatives of the procedure were thoroughly explained, informed     consent was obtained.  Digital rectal exam was performed and     revealed no rectal masses.   The LB [id number] endoscope     was introduced through the anus and advanced to the cecum, which     was identified by both the appendix and ileocecal valve, without     limitations.  The quality of the prep was good, using MoviPrep.     The instrument was then slowly withdrawn as the colon was fully     examined.     <<PROCEDUREIMAGES>>     FINDINGS:  A sessile polyp was found in the proximal transverse     colon. This was 3mm across, removed with cold snare and sent to     pathology (jar 1) (see image2 and image3).  Mild diverticulosis     was found in the sigmoid to descending colon segments (see     image4).  This was otherwise a normal examination of the colon     (see image5 and image1).   Retroflexed views in the rectum     revealed no abnormalities.    The scope was then withdrawn from     the patient and the procedure completed.           COMPLICATIONS:  None     ENDOSCOPIC IMPRESSION:     1) Sessile polyp in the proximal transverse colon; removed and     sent to  pathology     2) Mild diverticulosis in the sigmoid to descending colon     segments     3) Otherwise normal examination           RECOMMENDATIONS:     1) Given your personal history of adenomatous polyps, you should     have a repeat colonoscopy in 5 years even if the polyp removed     today is NOT precancerous     2) You will receive a letter within 1-2 weeks with the results     of your biopsy as well as final recommendations. Please call my     office if you have not received a letter after 3 weeks.           ______________________________     Rachael Fee, MD  n.     eSIGNED:   Rachael Fee at 04/29/2010 01:44 PM           Mckinley Jewel, [phone]  Note: An exclamation mark (!) indicates a result that was not dispersed into the flowsheet. Document Creation Date: 04/29/2010 1:45 PM _______________________________________________________________________  (1) Order result status: Final Collection or observation date-time: 04/29/2010 13:41 Requested date-time:  Receipt date-time:  Reported date-time:  Referring Physician:   Ordering Physician: Rob Bunting [phone]) Specimen Source:  Source: Launa Grill Order Number: [id number] Lab site:   Appended Document: Colonoscopy recall     Procedures Next Due Date:    Colonoscopy: 03/2015

## 2011-01-01 NOTE — Medication Information (Signed)
Summary: rov/eac  Anticoagulant Therapy  Managed by: Bethena Midget, RN, BSN Referring MD: Willa Rough,  MD PCP: Illene Regulus, MD Supervising MD: Eden Emms MD, Theron Arista Indication 1: Atrial Fibrillation (ICD-427.31) Lab Used: [facility] Site: [facility] INR POC 1.7 INR RANGE 2 - 3  Dietary changes: no    Health status changes: no    Bleeding/hemorrhagic complications: no    Recent/future hospitalizations: no    Any changes in medication regimen? no    Recent/future dental: no  Any missed doses?: no       Is patient compliant with meds? yes      Comments: Restarted s/p colonoscopy last Wednesdays.   Allergies: 1)  ! Sulfa  Anticoagulation Management History:      The patient is taking warfarin and comes in today for a routine follow up visit.  Negative risk factors for bleeding include an age less than 27 years old.  The bleeding index is 'low risk'.  Positive CHADS2 values include History of HTN.  Negative CHADS2 values include Age > 73 years old.  The start date was 08/31/2005.  Anticoagulation responsible provider: Eden Emms MD, Theron Arista.  INR POC: 1.7.  Cuvette Lot#: [id number].  Exp: 0/07/2011.    Anticoagulation Management Assessment/Plan:      The patient's current anticoagulation dose is Warfarin sodium 5 mg tabs: Use as directed by Anticoagulation Clinic.  The target INR is 2.0-3.0.  The next INR is due 05/27/2010.  Anticoagulation instructions were given to patient.  Results were reviewed/authorized by Bethena Midget, RN, BSN.  He was notified by Bethena Midget, RN, BSN.         Prior Anticoagulation Instructions: INR 2.6  Continue taking 1/2 tablet on Wednesday and 1 tablet all other days.  Return to clinic in 4 weeks.     Current Anticoagulation Instructions: INR 1.7 Today take extra 2.5mg s then resume 5mg s everyday except 2.5mg s on Wednesdays. Recheck in 3 weeks.

## 2011-01-12 ENCOUNTER — Encounter (INDEPENDENT_AMBULATORY_CARE_PROVIDER_SITE_OTHER): Payer: 59

## 2011-01-12 ENCOUNTER — Encounter: Payer: Self-pay | Admitting: Cardiology

## 2011-01-12 DIAGNOSIS — I4891 Unspecified atrial fibrillation: Secondary | ICD-10-CM

## 2011-01-12 DIAGNOSIS — Z7901 Long term (current) use of anticoagulants: Secondary | ICD-10-CM

## 2011-01-12 LAB — CONVERTED CEMR LAB: POC INR: 3.2

## 2011-01-20 DIAGNOSIS — Z7901 Long term (current) use of anticoagulants: Secondary | ICD-10-CM

## 2011-01-20 DIAGNOSIS — I4891 Unspecified atrial fibrillation: Secondary | ICD-10-CM

## 2011-01-21 NOTE — Medication Information (Addendum)
Summary: Coumadin Clinic   Anticoagulant Therapy  Managed by: Weston Brass, PharmD Referring MD: Willa Rough,  MD PCP: Illene Regulus, MD Supervising MD: Bensimhon MD,Daniel Indication 1: Atrial Fibrillation (ICD-427.31) Lab Used: [facility] Site: [facility] INR POC 3.2 INR RANGE 2 - 3  Dietary changes: no    Health status changes: no    Bleeding/hemorrhagic complications: no    Recent/future hospitalizations: no    Any changes in medication regimen? no    Recent/future dental: no  Any missed doses?: no       Is patient compliant with meds? yes       Allergies: 1)  ! Sulfa  Anticoagulation Management History:      The patient is taking warfarin and comes in today for a routine follow up visit.  Negative risk factors for bleeding include an age less than 12 years old.  The bleeding index is 'low risk'.  Positive CHADS2 values include History of HTN.  Negative CHADS2 values include Age > 33 years old.  The start date was 08/31/2005.  His last INR was 2.2.  Anticoagulation responsible provider: Bensimhon MD,Daniel.  INR POC: 3.2.  Cuvette Lot#: [id number].  Exp: 12/2011.    Anticoagulation Management Assessment/Plan:      The patient's current anticoagulation dose is Warfarin sodium 5 mg tabs: Use as directed by Anticoagulation Clinic.  The target INR is 2.0-3.0.  The next INR is due 02/09/2011.  Anticoagulation instructions were given to patient.  Results were reviewed/authorized by Weston Brass, PharmD.  He was notified by Margot Chimes PharmD Candidate.         Prior Anticoagulation Instructions: INR 2.5 (goal INR: 2-3)  Take 1 tablet everyday except 1/2 tablet on Wednesday.  Recheck in 4 weeks.      Current Anticoagulation Instructions: INR 3.2  Take 1/2 tablet tomorrow (Tuesday) and then resume your normal dose of 1 tablet everyday except 1/2 tablet on Wednesdays.  Recheck INR in 4 weeks.

## 2011-02-09 ENCOUNTER — Encounter (INDEPENDENT_AMBULATORY_CARE_PROVIDER_SITE_OTHER): Payer: 59

## 2011-02-09 ENCOUNTER — Encounter: Payer: Self-pay | Admitting: Cardiology

## 2011-02-09 DIAGNOSIS — Z7901 Long term (current) use of anticoagulants: Secondary | ICD-10-CM

## 2011-02-09 DIAGNOSIS — I4891 Unspecified atrial fibrillation: Secondary | ICD-10-CM

## 2011-02-14 ENCOUNTER — Encounter: Payer: Self-pay | Admitting: Cardiology

## 2011-02-17 NOTE — Medication Information (Signed)
Summary: rov/cb  Anticoagulant Therapy  Managed by: Cloyde Reams, RN, BSN Referring MD: Willa Rough,  MD PCP: Illene Regulus, MD Supervising MD: Antoine Poche MD, Fayrene Fearing Indication 1: Atrial Fibrillation (ICD-427.31) Lab Used: [facility] Site: [facility] INR POC 2.6 INR RANGE 2 - 3  Dietary changes: no    Health status changes: no    Bleeding/hemorrhagic complications: no    Recent/future hospitalizations: no    Any changes in medication regimen? no    Recent/future dental: no  Any missed doses?: yes     Details: May have missed yesterdays dosage of Coumadin  Is patient compliant with meds? yes       Allergies: 1)  ! Sulfa  Anticoagulation Management History:      The patient is taking warfarin and comes in today for a routine follow up visit.  Negative risk factors for bleeding include an age less than 87 years old.  The bleeding index is 'low risk'.  Positive CHADS2 values include History of HTN.  Negative CHADS2 values include Age > 44 years old.  The start date was 08/31/2005.  His last INR was 2.2.  Anticoagulation responsible provider: Antoine Poche MD, Fayrene Fearing.  INR POC: 2.6.  Cuvette Lot#: [id number].  Exp: 01/2012.    Anticoagulation Management Assessment/Plan:      The patient's current anticoagulation dose is Warfarin sodium 5 mg tabs: Use as directed by Anticoagulation Clinic.  The target INR is 2.0-3.0.  The next INR is due 03/16/2011.  Anticoagulation instructions were given to patient.  Results were reviewed/authorized by Cloyde Reams, RN, BSN.  He was notified by Cloyde Reams RN.         Prior Anticoagulation Instructions: INR 3.2  Take 1/2 tablet tomorrow (Tuesday) and then resume your normal dose of 1 tablet everyday except 1/2 tablet on Wednesdays.  Recheck INR in 4 weeks.    Current Anticoagulation Instructions: INR 2.6  Take 1.5 tablets today, then resume same dosage 1 tablet daily except 1/2 tablet on Wednesdays.  Recheck in 4 weeks.

## 2011-02-23 ENCOUNTER — Other Ambulatory Visit: Payer: Self-pay | Admitting: Gastroenterology

## 2011-02-25 ENCOUNTER — Ambulatory Visit (INDEPENDENT_AMBULATORY_CARE_PROVIDER_SITE_OTHER): Payer: 59 | Admitting: Cardiology

## 2011-02-25 ENCOUNTER — Encounter: Payer: Self-pay | Admitting: Cardiology

## 2011-02-25 VITALS — BP 118/68 | HR 72 | Ht 71.0 in | Wt 198.0 lb

## 2011-02-25 DIAGNOSIS — I493 Ventricular premature depolarization: Secondary | ICD-10-CM | POA: Insufficient documentation

## 2011-02-25 DIAGNOSIS — I4892 Unspecified atrial flutter: Secondary | ICD-10-CM | POA: Insufficient documentation

## 2011-02-25 DIAGNOSIS — I4891 Unspecified atrial fibrillation: Secondary | ICD-10-CM

## 2011-02-25 DIAGNOSIS — I779 Disorder of arteries and arterioles, unspecified: Secondary | ICD-10-CM | POA: Insufficient documentation

## 2011-02-25 DIAGNOSIS — R002 Palpitations: Secondary | ICD-10-CM | POA: Insufficient documentation

## 2011-02-25 DIAGNOSIS — Z5189 Encounter for other specified aftercare: Secondary | ICD-10-CM

## 2011-02-25 DIAGNOSIS — R001 Bradycardia, unspecified: Secondary | ICD-10-CM | POA: Insufficient documentation

## 2011-02-25 DIAGNOSIS — I739 Peripheral vascular disease, unspecified: Secondary | ICD-10-CM

## 2011-02-25 DIAGNOSIS — Z79899 Other long term (current) drug therapy: Secondary | ICD-10-CM

## 2011-02-25 DIAGNOSIS — Z7901 Long term (current) use of anticoagulants: Secondary | ICD-10-CM

## 2011-02-25 DIAGNOSIS — R17 Unspecified jaundice: Secondary | ICD-10-CM | POA: Insufficient documentation

## 2011-02-25 DIAGNOSIS — I498 Other specified cardiac arrhythmias: Secondary | ICD-10-CM

## 2011-02-25 DIAGNOSIS — E785 Hyperlipidemia, unspecified: Secondary | ICD-10-CM

## 2011-02-25 DIAGNOSIS — I25810 Atherosclerosis of coronary artery bypass graft(s) without angina pectoris: Secondary | ICD-10-CM

## 2011-02-25 NOTE — Patient Instructions (Signed)
INR 2.8  Continue same dose of 1 tablet every day except 1/2 tablet on Wednesday.

## 2011-02-25 NOTE — Assessment & Plan Note (Signed)
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  His rate is slow with atrial fib.  Many medications that would be considered might lead to the need for pacemaker.

## 2011-02-25 NOTE — Assessment & Plan Note (Signed)
I had increased the patient's Lipitor to 40 mg daily. His symptoms recently may have been partially related to drug therapy.  He cut his dose back to 20 mg and he is feeling better.  Lipid studies will be done tomorrow along with a CPK.  For now he will be kept on 20 mg daily.

## 2011-02-25 NOTE — Assessment & Plan Note (Signed)
Coronary disease is stable.  EKG reveals atrial fib with nonspecific ST-T wave changes.  He is not having chest pain.  His last stress study was done approximately a year ago.  There is no need to proceed with a stress study at this point.

## 2011-02-25 NOTE — Assessment & Plan Note (Addendum)
The patient now is in atrial fibrillation.  He may have been in for several weeks.  He is fully anticoagulated.  He and I have had a very long and careful discussion about the approach.  He is going out of town.  We checked his INR today is 2.8.  It will be checked when he returns from out of town.  EKG will be checked again then.  If he remains in atrial fib we will proceed with cardioversion.  We will watch to see if he maintains sinus rhythm.  Decisions will be made about possible antiarrhythmic drugs or whether atrial fib ablation is to be considered.  I will review the entire situation with Dr. Ladona Ridgel who knows the patient well.

## 2011-02-25 NOTE — Assessment & Plan Note (Signed)
Patient continues on Coumadin.  INR is checked today at 2.8.  In I talked about whether we should switch him to other medications at some point.  For now he will continue on Coumadin.

## 2011-02-25 NOTE — Patient Instructions (Signed)
Your physician recommends that you return for a FASTING lipid profile: tom. 3/29 Your physician recommends that you schedule a follow-up appointment in: 3 weeks

## 2011-02-25 NOTE — Progress Notes (Signed)
HPI The patient is seen for followup of atrial fibrillation.  Over time the patient has had paroxysmal atrial fibrillation.  He had atrial flutter in the past that was ablated.  He is not had prolonged atrial fibrillation.  He is fully anticoagulated.  His INR has been very steady.  There has been no dose adjustment of his Coumadin.  For several weeks he has felt fatigued.  He also has had some muscle aches and pains.  He cut his Lipitor back from 40-20 mg and he is feeling better.  He's not had any significant palpitations.  There has not been any significant chest pain.  Allergies  Allergen Reactions  . Sulfonamide Derivatives     Current Outpatient Prescriptions  Medication Sig Dispense Refill  . aspirin 81 MG tablet Take 81 mg by mouth daily.        Marland Lord atorvastatin (LIPITOR) 40 MG tablet Take 20 mg by mouth daily.       . Multiple Vitamin (MULTIVITAMIN) capsule Take 1 capsule by mouth daily.        Marland Brodzinski NEXIUM 40 MG capsule TAKE (1) CAPSULE DAILY.  30 each  6  . ramipril (ALTACE) 2.5 MG capsule Take 2.5 mg by mouth daily.        . valACYclovir (VALTREX) 1000 MG tablet Take 1,000 mg by mouth 2 (two) times daily as needed.       . warfarin (COUMADIN) 5 MG tablet Take by mouth as directed.          History   Social History  . Marital Status: Married    Spouse Name: N/A    Number of Children: N/A  . Years of Education: N/A   Occupational History  . Not on file.   Social History Main Topics  . Smoking status: Never Smoker   . Smokeless tobacco: Not on file  . Alcohol Use: No  . Drug Use: No  . Sexually Active: Not on file   Other Topics Concern  . Not on file   Social History Narrative   chapel HIll BA, MBAOccupation:philanthropist at [employer]Married-'70-13 yrs divorce; married '972 daughters-'75, '79; 1 grandchild; step-daughter and step grandsonNever SmokedAlcohol use-yes, 2 glasses wine/dayDrug use-noRegular exercise-yes, runs 1.5-2 mi 4x/wk, also eliptical    Family  History  Problem Relation Age of Onset  . Coronary artery disease    . Diabetes    . Hyperlipidemia    . Hypertension      Past Medical History  Diagnosis Date  . CAD (coronary artery disease) of bypass graft     Myoview March, 2011, excellent exercise, hypertensive response, no scar or ischemia, EF 61%, mild palpitations peak stress with infrequent PACs and PVCs.  Marland Whiteman HTN (hypertension)     Controlled at rest, hypertension on treadmill, sensitive to medications  . Hyperlipidemia     Low HDL  . GERD (gastroesophageal reflux disease)     Barrett's esophagus  . Colonic polyp   . A-fib     Paroxysmal.. see the discussion on her palpitations  . Palpitations      exercise palpitations, probably A. fib-not VT,Pindolol not tolerated, CAD, bradycardia,Tickosyn needs hospital +/_ side effects, amiodarone & sotalol could need pacer,Multaq less effective not tried, atrial fib ablation could be considered at low risk  . Foot pain     Chronic lateral foot pain-resolved with use of orthotics '09  . Bradycardia   . Aortic stenosis     EF 60-65%, echo, September, 2010 /         .  mild...echo..9/ 2010 AI...mild ...echo...9/ 2010  . PVC's (premature ventricular contractions)   . CAD (coronary artery disease)     Myoview March, 2011, hypertensive blood pressure response, no scar or ischemia, EF 61%, mild palpitations at peak stress with infrequent PACs and PVCs.  . Carotid artery disease     Doppler November, 2010, 0-39% bilateral followup 2 years  . Atrial flutter     Status post ablation  . Drug therapy     Coumadin for atrial fib  . Aortic insufficiency     Mild, echo, September, 2010  . Drug intolerance     Niaspan  . Elevated bilirubin     Mild chronic elevation, 2.0 January, 2011 stable  . BPH (benign prostatic hyperplasia)   . Drug intolerance     Pindolol before exercise not tolerated    Past Surgical History  Procedure Date  . Coronary artery bypass graft 2000  . Tonsillectomy       ROS  Patient denies fever, chills, headache, sweats, rash, change in vision, change in hearing, chest pain, cough, nausea vomiting, urinary symptoms.  All other systems are reviewed and are negative. PHYSICAL EXAM Patient is quite stable.  He is oriented to person time and place.  Affect is normal.  Head is atraumatic.  There is no xanthelasma.  There is no jugulovenous distention.  Lungs are clear.  Respiratory effort is not labored.  Reactive exam reveals an S1-S2.  No clicks or significant murmurs.  The rhythm is irregularly irregular.  Abdomen is soft.  There is no peripheral edema.  There are no musculoskeletal deformities.  There no skin rashes. Filed Vitals:   02/25/11 1538  BP: 118/68  Pulse: 72  Height: 5\' 11"  (1.803 m)  Weight: 198 lb (89.812 kg)    EKG  EKG is done today and reviewed by me.  There is atrial fibrillation.  The rate is 60 beats per minute.  This is new for him. ASSESSMENT & PLAN

## 2011-02-26 ENCOUNTER — Other Ambulatory Visit (INDEPENDENT_AMBULATORY_CARE_PROVIDER_SITE_OTHER): Payer: 59 | Admitting: *Deleted

## 2011-02-26 DIAGNOSIS — Z5189 Encounter for other specified aftercare: Secondary | ICD-10-CM

## 2011-02-26 DIAGNOSIS — E785 Hyperlipidemia, unspecified: Secondary | ICD-10-CM

## 2011-02-26 DIAGNOSIS — Z79899 Other long term (current) drug therapy: Secondary | ICD-10-CM

## 2011-02-26 LAB — POCT INR: INR: 2.8

## 2011-02-26 LAB — HEPATIC FUNCTION PANEL
ALT: 26 U/L (ref 0–53)
AST: 27 U/L (ref 0–37)
Albumin: 3.9 g/dL (ref 3.5–5.2)
Alkaline Phosphatase: 72 U/L (ref 39–117)
Total Bilirubin: 1.8 mg/dL — ABNORMAL HIGH (ref 0.3–1.2)

## 2011-02-26 LAB — LIPID PANEL
Cholesterol: 123 mg/dL (ref 0–200)
Triglycerides: 59 mg/dL (ref 0.0–149.0)
VLDL: 11.8 mg/dL (ref 0.0–40.0)

## 2011-03-16 ENCOUNTER — Encounter: Payer: 59 | Admitting: *Deleted

## 2011-03-17 ENCOUNTER — Other Ambulatory Visit (HOSPITAL_COMMUNITY): Payer: Self-pay | Admitting: Radiology

## 2011-03-17 ENCOUNTER — Ambulatory Visit (INDEPENDENT_AMBULATORY_CARE_PROVIDER_SITE_OTHER): Payer: 59 | Admitting: *Deleted

## 2011-03-17 ENCOUNTER — Ambulatory Visit (INDEPENDENT_AMBULATORY_CARE_PROVIDER_SITE_OTHER): Payer: 59 | Admitting: Cardiology

## 2011-03-17 ENCOUNTER — Encounter: Payer: Self-pay | Admitting: Cardiology

## 2011-03-17 ENCOUNTER — Ambulatory Visit (HOSPITAL_COMMUNITY): Payer: 59 | Attending: Cardiology | Admitting: Radiology

## 2011-03-17 VITALS — BP 116/72 | HR 73 | Resp 18 | Ht 71.0 in | Wt 195.0 lb

## 2011-03-17 DIAGNOSIS — E785 Hyperlipidemia, unspecified: Secondary | ICD-10-CM | POA: Insufficient documentation

## 2011-03-17 DIAGNOSIS — I4891 Unspecified atrial fibrillation: Secondary | ICD-10-CM

## 2011-03-17 DIAGNOSIS — R9430 Abnormal result of cardiovascular function study, unspecified: Secondary | ICD-10-CM | POA: Insufficient documentation

## 2011-03-17 DIAGNOSIS — I25810 Atherosclerosis of coronary artery bypass graft(s) without angina pectoris: Secondary | ICD-10-CM

## 2011-03-17 DIAGNOSIS — I2510 Atherosclerotic heart disease of native coronary artery without angina pectoris: Secondary | ICD-10-CM | POA: Insufficient documentation

## 2011-03-17 DIAGNOSIS — I359 Nonrheumatic aortic valve disorder, unspecified: Secondary | ICD-10-CM

## 2011-03-17 DIAGNOSIS — I079 Rheumatic tricuspid valve disease, unspecified: Secondary | ICD-10-CM | POA: Insufficient documentation

## 2011-03-17 DIAGNOSIS — I351 Nonrheumatic aortic (valve) insufficiency: Secondary | ICD-10-CM

## 2011-03-17 DIAGNOSIS — I080 Rheumatic disorders of both mitral and aortic valves: Secondary | ICD-10-CM | POA: Insufficient documentation

## 2011-03-17 DIAGNOSIS — Z7901 Long term (current) use of anticoagulants: Secondary | ICD-10-CM

## 2011-03-17 LAB — POCT INR: INR: 3.2

## 2011-03-17 NOTE — Assessment & Plan Note (Signed)
Coronary disease is stable. No change in therapy. 

## 2011-03-17 NOTE — Assessment & Plan Note (Signed)
Echo today reveals aortic insufficiency is mild to moderate.  No change in therapy.

## 2011-03-17 NOTE — Assessment & Plan Note (Signed)
The decision has been made to admit the patient for  Tikosyn therapy.  We will finalize the admission date of most probably it would be Friday, April 20.

## 2011-03-17 NOTE — Progress Notes (Signed)
HPI   Patient returns for followup of atrial fibrillation.  Since his last visit I spoke with Dr. Ladona Ridgel extensively.  I called the patient and had a long discussion with him also.  He came today to make a decision about hospital admission for Tikosyn.  We have discussed extensively the pros and cons of using this drug.  We have considered whether we would try a one-time cardioversion without the drug.  We talked about the fact that he needs to be on one drug trial before any consideration could be given to atrial fib ablation.  We carefully watched his Coumadin.  His INR today is 3.2.  QT Interval previously was 321 ms and 420 ms.  This is within the acceptable range for the use of this drug.  He has normal renal function.  Followup two-dimensional echo was done today to reassess his LV to be sure there have been no significant change.  Ejection fraction is more difficult to assess in atrial fibrillation but his EF remains normal.  Left atrial size is approximately 47 mm. Allergies  Allergen Reactions  . Sulfonamide Derivatives     Current Outpatient Prescriptions  Medication Sig Dispense Refill  . aspirin 81 MG tablet Take 81 mg by mouth daily.        Russell Griffith atorvastatin (LIPITOR) 40 MG tablet Take 20 mg by mouth daily.       . Multiple Vitamin (MULTIVITAMIN) capsule Take 1 capsule by mouth daily.        Russell Sane NEXIUM 40 MG capsule TAKE (1) CAPSULE DAILY.  30 each  6  . ramipril (ALTACE) 2.5 MG capsule Take 2.5 mg by mouth daily.        . valACYclovir (VALTREX) 1000 MG tablet Take 1,000 mg by mouth 2 (two) times daily as needed.       . warfarin (COUMADIN) 5 MG tablet Take by mouth as directed.          History   Social History  . Marital Status: Married    Spouse Name: N/A    Number of Children: N/A  . Years of Education: N/A   Occupational History  . Not on file.   Social History Main Topics  . Smoking status: Never Smoker   . Smokeless tobacco: Not on file  . Alcohol Use: No  . Drug Use:  No  . Sexually Active: Not on file   Other Topics Concern  . Not on file   Social History Narrative   chapel HIll BA, MBAOccupation:philanthropist at [employer]Married-'70-13 yrs divorce; married '972 daughters-'75, '79; 1 grandchild; step-daughter and step grandsonNever SmokedAlcohol use-yes, 2 glasses wine/dayDrug use-noRegular exercise-yes, runs 1.5-2 mi 4x/wk, also eliptical    Family History  Problem Relation Age of Onset  . Coronary artery disease    . Diabetes    . Hyperlipidemia    . Hypertension      Past Medical History  Diagnosis Date  . CAD (coronary artery disease) of bypass graft     Myoview March, 2011, excellent exercise, hypertensive response, no scar or ischemia, EF 61%, mild palpitations peak stress with infrequent PACs and PVCs.  Russell Berne HTN (hypertension)     Controlled at rest, hypertension on treadmill, sensitive to medications  . Hyperlipidemia     Low HDL  . GERD (gastroesophageal reflux disease)     Barrett's esophagus  . Colonic polyp   . A-fib     Atrial fib at rest February 25, 2011 / plan to admit for  Tickosyn therapy  . Palpitations      exercise palpitations, probably A. fib-not VT,Pindolol not tolerated, CAD, bradycardia,Tickosyn needs hospital +/_ side effects, amiodarone & sotalol could need pacer,Multaq less effective not tried, atrial fib ablation could be considered at low risk  . Foot pain     Chronic lateral foot pain-resolved with use of orthotics '09  . Bradycardia   . Aortic stenosis     EF 60-65%, echo, September, 2010 /         .mild...echo..9/ 2010 AI...mild ...echo...9/ 2010  . PVC's (premature ventricular contractions)   . CAD (coronary artery disease)     Myoview March, 2011, hypertensive blood pressure response, no scar or ischemia, EF 61%, mild palpitations at peak stress with infrequent PACs and PVCs.  . Carotid artery disease     Doppler November, 2010, 0-39% bilateral followup 2 years  . Atrial flutter     Status post  ablation  . Drug therapy     Coumadin for atrial fib  . Aortic insufficiency      Mild, echo, 2010 / mild/moderate, echo, 2012  . Drug intolerance     Niaspan  . Elevated bilirubin     Mild chronic elevation, 2.0 January, 2011 stable  . BPH (benign prostatic hyperplasia)   . Drug intolerance     Pindolol before exercise not tolerated  . Ejection fraction     EF 60-65%, echo, September, 2010 / EF 60-65% echo, April, 2012    Past Surgical History  Procedure Date  . Coronary artery bypass graft 2000  . Tonsillectomy     ROS  Patient denies fever, chills, headache, sweats, rash, change in vision, change in hearing, chest pain, cough, nausea vomiting, urinary symptoms.  All other systems are reviewed and are negative.  PHYSICAL EXAM Patient is here with his wife today.  He is oriented to person time and place.  Affect is normal.  Head is atraumatic.  There is no xanthelasma.  There is no jugular venous distention.  There are no carotid bruits.  Lungs are clear.  Respiratory effort is unlabored.  Cardiac exam reveals S1 and S2.  There are no clicks or significant murmurs.  The rhythm is irregularly irregular.  The abdomen is soft.  There is no peripheral edema.  There are no musculoskeletal deformities.  There are no skin rashes. Filed Vitals:   03/17/11 1448  BP: 116/72  Pulse: 73  Resp: 18  Height: 5\' 11"  (1.803 m)  Weight: 195 lb (88.451 kg)    EKG Done today and reviewed by me.  Patient does have atrial fibrillation.  The rate is controlled.  ASSESSMENT & PLAN

## 2011-03-17 NOTE — Patient Instructions (Signed)
Labs today Plan admission to hospital on Friday AM, we will call you to finalize

## 2011-03-18 LAB — BASIC METABOLIC PANEL
CO2: 27 mEq/L (ref 19–32)
Chloride: 107 mEq/L (ref 96–112)
Creatinine, Ser: 1 mg/dL (ref 0.4–1.5)
Glucose, Bld: 108 mg/dL — ABNORMAL HIGH (ref 70–99)

## 2011-03-20 ENCOUNTER — Inpatient Hospital Stay (HOSPITAL_COMMUNITY)
Admission: RE | Admit: 2011-03-20 | Discharge: 2011-03-23 | DRG: 310 | Disposition: A | Discharge status: Home / Self Care | Payer: 59 | Source: Ambulatory Visit | Attending: Cardiology | Admitting: Cardiology

## 2011-03-20 ENCOUNTER — Other Ambulatory Visit: Payer: Self-pay | Admitting: Cardiology

## 2011-03-20 ENCOUNTER — Inpatient Hospital Stay (HOSPITAL_COMMUNITY): Payer: 59

## 2011-03-20 ENCOUNTER — Other Ambulatory Visit (HOSPITAL_COMMUNITY): Payer: 59

## 2011-03-20 DIAGNOSIS — Z860100 Personal history of colon polyps, unspecified: Secondary | ICD-10-CM

## 2011-03-20 DIAGNOSIS — Z7901 Long term (current) use of anticoagulants: Secondary | ICD-10-CM

## 2011-03-20 DIAGNOSIS — I2510 Atherosclerotic heart disease of native coronary artery without angina pectoris: Secondary | ICD-10-CM | POA: Diagnosis present

## 2011-03-20 DIAGNOSIS — I359 Nonrheumatic aortic valve disorder, unspecified: Secondary | ICD-10-CM | POA: Diagnosis present

## 2011-03-20 DIAGNOSIS — I4949 Other premature depolarization: Secondary | ICD-10-CM | POA: Diagnosis present

## 2011-03-20 DIAGNOSIS — I10 Essential (primary) hypertension: Secondary | ICD-10-CM | POA: Diagnosis present

## 2011-03-20 DIAGNOSIS — I491 Atrial premature depolarization: Secondary | ICD-10-CM | POA: Diagnosis present

## 2011-03-20 DIAGNOSIS — I6529 Occlusion and stenosis of unspecified carotid artery: Secondary | ICD-10-CM | POA: Diagnosis present

## 2011-03-20 DIAGNOSIS — R52 Pain, unspecified: Secondary | ICD-10-CM

## 2011-03-20 DIAGNOSIS — I4891 Unspecified atrial fibrillation: Principal | ICD-10-CM | POA: Diagnosis present

## 2011-03-20 DIAGNOSIS — E785 Hyperlipidemia, unspecified: Secondary | ICD-10-CM | POA: Diagnosis present

## 2011-03-20 DIAGNOSIS — Z951 Presence of aortocoronary bypass graft: Secondary | ICD-10-CM

## 2011-03-20 DIAGNOSIS — N400 Enlarged prostate without lower urinary tract symptoms: Secondary | ICD-10-CM | POA: Diagnosis present

## 2011-03-20 DIAGNOSIS — I498 Other specified cardiac arrhythmias: Secondary | ICD-10-CM | POA: Diagnosis present

## 2011-03-20 LAB — BASIC METABOLIC PANEL
Calcium: 9.2 mg/dL (ref 8.4–10.5)
GFR calc Af Amer: >60 mL/min (ref >60)
GFR calc non Af Amer: >60 mL/min (ref >60)
Glucose, Bld: 142 mg/dL — ABNORMAL HIGH (ref 70–99)
Potassium: 4.7 mEq/L (ref 3.5–5.1)
Sodium: 135 mEq/L (ref 135–145)

## 2011-03-20 LAB — CBC
HCT: 45.5 % (ref 39.0–52.0)
MCH: 30.5 pg (ref 26.0–34.0)
MCV: 85 fL (ref 78.0–100.0)
RBC: 5.35 MIL/uL (ref 4.22–5.81)
RDW: 13.3 % (ref 11.5–15.5)
WBC: 9.2 10*3/uL (ref 4.0–10.5)

## 2011-03-20 LAB — MAGNESIUM: Magnesium: 2.1 mg/dL (ref 1.5–2.5)

## 2011-03-20 NOTE — H&P (Signed)
NAME:  Russell Griffith, Russell Griffith NO.:  [id number]  MEDICAL RECORD NO.:  [id number]           PATIENT TYPE:  O  LOCATION:  NINV                         FACILITY:  [facility]  PHYSICIAN:  Luis Abed, MD, FACCDATE OF BIRTH:  05-01-1946  DATE OF ADMISSION:  03/17/2011 DATE OF DISCHARGE:                             HISTORY & PHYSICAL   Mr. Barnette is admitted for the initiation of Tikosyn therapy.  The patient has cardiac history including coronary artery disease, status post CABG in the past.  He also has a history of atrial flutter ablation in the past.  In addition, he has had paroxysmal atrial fibrillation.  Most recently he has had continuous atrial fibrillation. He also has a history of bradycardia.  After extensive review and consultation with Dr. Ladona Ridgel of the electrophysiology team the decision has been made to load him with Tikosyn.  His INR has been followed very carefully on Coumadin and he is therapeutic and has been for greater than at least 4 weeks.  His potassium is in the normal range.  Magnesium is in the normal range.  His QT interval has been watched very carefully and is below 440 milliseconds.  He has good renal function.  He has no contraindications to Tikosyn.  He is therefore admitted.  PAST MEDICAL HISTORY:  Allergies.  ALLERGIES:  SULFA.  MEDICATIONS: 1. Aspirin 81 mg daily. 2. Lipitor 20 mg daily. 3. Multivitamin 1 tablet daily. 4. Nexium 40 mg daily. 5. Ramipril 2.5 mg daily. 6. Valtrex 1000 b.i.d. when needed. 7. Warfarin 5 mg tablets as directed.  OTHER MEDICAL PROBLEMS:  See the complete list below.  SOCIAL HISTORY:  The patient is married and here with his wife.  He does not smoke.  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  FAMILY HISTORY:  There is a family history of coronary artery disease.  REVIEW OF SYSTEMS:  The patient denies fever, chills, headache, sweats, rash, change  in vision, change in hearing, chest pain, cough, nausea or vomiting, urinary symptoms.  All other systems are reviewed and are negative.  PHYSICAL EXAM:  GENERAL:  The patient is stable and here with his wife. He is oriented to person, time, and place.  Affect is normal. VITAL SIGNS:  Blood pressure is 116/70.  Pulse is 79.  Respirations is 18.  His height is 71 inches and he weighs 195 pounds (88.4 kg). HEAD:  Atraumatic.  There is no xanthelasma.  There are no carotid bruits.  There is no jugular venous distention. LUNGS:  Clear.  Respiratory effort is not labored. CARDIAC EXAM:  Reveals S1-S2.  The rhythm is irregularly irregular. There are no clicks or murmurs. ABDOMEN:  Soft.  There is no peripheral edema. MUSCULOSKELETAL:  There are no musculoskeletal deformities. SKIN:  There are no skin rashes.  EKG:  EKG, reveals atrial fibrillation.  Very careful attention over time has been given to his QT interval.  Historically his QT interval is normal.  In 2010, his corrected QT interval was 420 milliseconds.  In June 2011, corrected QT  interval was 391 milliseconds.  This is withnormal sinus rhythm.  With atrial fibrillation his QT interval is more difficult to assess.  He has been less than 440 milliseconds.  These EKGs have been reviewed with Dr. Ladona Ridgel.  Chest x-ray is pending.  LABORATORY DATA:  Hemoglobin today is 16.3.  Platelet count is 208,000. At the time of this dictation his blood has been drawn for his BMET, but the results are not available.  His potassium was normal 2 days ago. Creatinine was normal and magnesium was normal.  These are repeated this morning and will be available shortly.  INR checked 2 days ago was 2.8.  PROBLEMS: 1. Coronary artery disease.  The patient is post CABG.  Myoview in     March, 2011 revealed excellent exercise.  There was no scar or     ischemia.  Ejection fraction was 61%. 2. Hypertension controlled at rest.  He does have some  hypertensive     response in the treadmill. 3. Hyperlipidemia.  This is treated.  He does have a low HDL. 4. Gastroesophageal reflux disease with Barrett's esophagus that is     very mild. 5. History of a colonic polyp. 6. Bradycardia.  He has never had symptomatic bradycardia.  However,     this has limited our choices of antiarrhythmic this.  It has been     felt that any antiarrhythmic other than Tikosyn might lead to a     need for pacemaker. 7. Aortic stenosis, mild echo 2012. 8. Aortic insufficiency, mild-to-moderate echo April, 2012. 9. Premature ventricular contraction.  These have been scattered. 10.Carotid artery disease.  Doppler in 2010 revealed 0 to 39%     bilateral disease. 11.Coumadin therapy.  He has been therapeutic with no need for change     in his medications. 12.Niaspan intolerance this has been an issue and he is not on niacin. 13.Elevated bilirubin.  He has chronic bilirubin elevation in the     range of 1.9-2.0. 14.Benign prostatic hypertrophy. 15.Ejection fraction 60%-65% by echo April, 2012. 16.**Atrial fibrillation.  Historically the patient has had     palpitations on the treadmill.  We have assess this carefully.     This has been felt not to be related to a ventricular arrhythmia.     We think that he has some intermittent atrial fib at high levels of     exercise.  I had attempted to try pindolol with a single dose     before excises.  He felt poorly with this.  Recently in March,     2012, the patient developed a resting atrial fibrillation with a     relatively slow response.  It was felt that amiodarone or sotalol     or Multaq could lead to the need for pacemaker.  It was felt that     Tikosyn would be the proper drug of choice.  After careful     consideration he is now     admitted for Tikosyn load.  All parameters have been watched very     carefully.  His QT interval is difficult to assess in atrial     fibrillation.  It is well documented at  sinus rhythm that he does     not have QT prolongation.  Therefore, the patient is admitted for     Tikosyn therapy today.     Luis Abed, MD, [facility]     JDK/MEDQ  D:  03/20/2011  T:  03/20/2011  Job:  [id number]  Electronically Signed by Willa Rough MD FACC on 03/20/2011 01:07:50 PM

## 2011-03-21 LAB — PROTIME-INR
INR: 2.7 — ABNORMAL HIGH (ref 0.00–1.49)
Prothrombin Time: 28.8 seconds — ABNORMAL HIGH (ref 11.6–15.2)

## 2011-03-21 LAB — BASIC METABOLIC PANEL
BUN: 22 mg/dL (ref 6–23)
Calcium: 9.1 mg/dL (ref 8.4–10.5)
Chloride: 101 mEq/L (ref 96–112)
Creatinine, Ser: 1.16 mg/dL (ref 0.4–1.5)
GFR calc Af Amer: >60 mL/min (ref >60)

## 2011-03-22 LAB — BASIC METABOLIC PANEL
BUN: 20 mg/dL (ref 6–23)
CO2: 27 mEq/L (ref 19–32)
Calcium: 8.8 mg/dL (ref 8.4–10.5)
GFR calc non Af Amer: >60 mL/min (ref >60)
Glucose, Bld: 105 mg/dL — ABNORMAL HIGH (ref 70–99)
Potassium: 3.9 mEq/L (ref 3.5–5.1)
Sodium: 136 mEq/L (ref 135–145)

## 2011-03-23 LAB — BASIC METABOLIC PANEL
CO2: 29 mEq/L (ref 19–32)
Chloride: 105 mEq/L (ref 96–112)
GFR calc Af Amer: >60 mL/min (ref >60)
Glucose, Bld: 101 mg/dL — ABNORMAL HIGH (ref 70–99)
Sodium: 140 mEq/L (ref 135–145)

## 2011-03-23 NOTE — Discharge Summary (Addendum)
NAME:  Russell Griffith, PATIL NO.:  [id number]  MEDICAL RECORD NO.:  [id number]           PATIENT TYPE:  I  LOCATION:  3714                         FACILITY:  [facility]  PHYSICIAN:  Luis Abed, MD, FACCDATE OF BIRTH:  1946-03-12  DATE OF ADMISSION:  03/20/2011 DATE OF DISCHARGE:  03/23/2011                              DISCHARGE SUMMARY   DISCHARGE DIAGNOSES: 1. Atrial fibrillation.     a.     Initiated on Tikosyn 500 mcg b.i.d. this admission.     b.     Anticoagulated with Coumadin with discharge INR of 2.38.     c.     With a history of bradycardia, he has been previously felt      amiodarone, sotalol, or Multaq could lead to need for pacemaker. 2. Bradycardia, asymptomatic. 3. Coronary artery disease, status post coronary artery bypass     grafting in 2000.     a.     Myoview in March 2011 showing no scarring or ischemia. 4. Hypertension. 5. Hyperlipidemia.     a.     History of Niaspan intolerance. 6. Mild aortic stenosis by echo in 2000. 7. Mild to moderate AI by echo in April 2012. 8. Premature atrial contractions/premature ventricular contractions. 9. Carotid artery disease, 0-39% bilaterally by Doppler in November     2010. 10.Atrial flutter, status post ablation. 11.Mild chronically elevated bilirubin. 12.History of colon polyps. 13.Benign prostatic hypertrophy. 14.History of tonsillectomy.  HOSPITAL COURSE:  [name] is a 65 year old gentleman with history of CAD status post CABG, atrial flutter status post ablation, and paroxysmal atrial fibrillation.  More recently, however, he has had continuous AFib along with a history of bradycardia.  Dr. Myrtis Ser discussed the patient's case in depth with his prior electrophysiologist, Dr. Ladona Ridgel and decision was made to proceed with Tikosyn loading.  He has a history of bradycardia with inability to tolerate pindolol.  There has also been the fear that the patient may require pacemaker implantation if he  were to go on amiodarone, sotalol, or Multaq and therefore Tikosyn was felt to be the best option.  His EF has been normal.  He was brought into the hospital on March 28, 2011, and initiated on 500 mcg daily with stable QTc.  He did have some sinus bradycardia in the very early morning hours, while sleeping, asymptomatic.  This is the reason why he is not on beta-blocker.  The patient did well over the weekend and was in sinus today.  Dr. Myrtis Ser has seen and examined him today and feels he is stable for discharge.  DISCHARGE LABORATORY DATA:  WBC 9.2, hemoglobin 16.3, hematocrit 45.5, and platelet count 208 on March 20, 2011.  INR is 2.38, sodium 140, potassium 4.0, chloride 105, CO2 of 29, glucose 101, BUN 22, and creatinine 1.10.  Magnesium was 2.1 on admission.  STUDIES:  Chest x-ray on March 27, 2011, showed cardiomegaly.  DISCHARGE MEDICATIONS: 1. Tikosyn 500 mcg q.12 h. 2. Potassium chloride 10 mEq daily. 3. Aspirin 81 mg every morning. 4. Lipitor 20 mg every night. 5. Multivitamin 1 tablet every morning. 6. Nexium  40 mg every morning. 7. Ramipril 2.5 mg every night. 8. Coumadin 5 mg 1/2 tablet on Wednesday and 1 tablet all other days.  Dr. Myrtis Ser has written a prescription for both 30-day supply as well as a 40-day supply.  We asked case manager to help with ensuring that this medication is available from the patient's pharmacy.  DISPOSITION:  Mr. Sobolewski will be discharged in stable condition to home.  He is instructed to increase activity slowly and may shower and bathe and walk up steps.  He is to follow a low-sodium, heart-healthy diet.  He will follow up with Dr. Myrtis Ser in approximately 1-2 weeks as well as have an INR checked in approximately 3 weeks.  Our office will call him with these appointments.  DURATION OF DISCHARGE ENCOUNTER:  Greater than 30 minutes including physician and PA time.     Russell Griffith, P.A.C.   ______________________________ Luis Abed,  MD, [facility]    DD/MEDQ  D:  03/23/2011  T:  03/23/2011  Job:  [id number]  Electronically Signed by Russell Rough MD FACC on 03/23/2011 02:20:55 PM Electronically Signed by Russell Griffith  on 03/24/2011 04:37:36 PM MedRecNo: [id number] [facility], Account: [id number], DocSeq: [id number] As it was not part of his home medicine list, he was also discharged with a prescription for Nitroglycerine SL 0.4mg  every 5 minutes as needed for chest pain because of his known hx of CAD. Electronically Signed by Russell Griffith  on 03/24/2011 04:33:21 PM

## 2011-03-26 ENCOUNTER — Telehealth: Payer: Self-pay | Admitting: Internal Medicine

## 2011-03-26 ENCOUNTER — Ambulatory Visit (INDEPENDENT_AMBULATORY_CARE_PROVIDER_SITE_OTHER): Payer: 59 | Admitting: Internal Medicine

## 2011-03-26 ENCOUNTER — Encounter: Payer: Self-pay | Admitting: Internal Medicine

## 2011-03-26 DIAGNOSIS — I4581 Long QT syndrome: Secondary | ICD-10-CM

## 2011-03-26 DIAGNOSIS — I4891 Unspecified atrial fibrillation: Secondary | ICD-10-CM

## 2011-03-26 LAB — BASIC METABOLIC PANEL
BUN: 21 mg/dL (ref 6–23)
CO2: 30 mEq/L (ref 19–32)
Chloride: 105 mEq/L (ref 96–112)
Creatinine, Ser: 1.1 mg/dL (ref 0.4–1.5)
Glucose, Bld: 100 mg/dL — ABNORMAL HIGH (ref 70–99)
Potassium: 3.9 mEq/L (ref 3.5–5.1)

## 2011-03-26 MED ORDER — DOFETILIDE 250 MCG PO CAPS: 250.0000 ug | ORAL_CAPSULE | Freq: Two times a day (BID) | ORAL | Status: DC

## 2011-03-26 MED ORDER — DOFETILIDE 125 MCG PO CAPS: 125.0000 ug | ORAL_CAPSULE | Freq: Two times a day (BID) | ORAL | Status: DC

## 2011-03-26 NOTE — Telephone Encounter (Signed)
pls clarify direction & dosage of tikosyn.  Ask for mike.

## 2011-03-26 NOTE — Telephone Encounter (Signed)
PHARMACY AWARE PT IS ON TIKOSYN 375 MCG BID .Zack Seal

## 2011-03-26 NOTE — Patient Instructions (Signed)
Lab today Stop Tikosyn 500mg  Start taking Tikosyn 125mg  tab and a 250mg  tab Twice daily to equal 375mg  Twice daily  Follow up with Dr Myrtis Ser on Mon. 4/30 at 9:30

## 2011-03-26 NOTE — Progress Notes (Signed)
HPI  Russell Griffith is a 65 y.o. male Seen because of complaints of recurrent atrial fibrillation. He was recently hospitalized for initiation of Tikosyn which was associated with spontaneous reversion to sinus rhythm. He has felt great.  Last night he reverted to atrial fibrillation he's. He spoke with Dr. Myrtis Griffith who recommended that he come in. This morning he went back to sinus rhythm.  He is tolerating the Tikosyn well at this point without diaphoresis  Past Medical History  Diagnosis Date  . CAD (coronary artery disease) of bypass graft     Myoview March, 2011, excellent exercise, hypertensive response, no scar or ischemia, EF 61%, mild palpitations peak stress with infrequent PACs and PVCs.  Russell Griffith Russell Griffith (hypertension)     Controlled at rest, hypertension on treadmill, sensitive to medications  . Hyperlipidemia     Low HDL  . GERD (gastroesophageal reflux disease)     Barrett's esophagus  . Colonic polyp   . A-fib     Atrial fib at rest February 25, 2011 / plan to admit for [facility] therapy  . Palpitations      exercise palpitations, probably A. fib-not VT,Pindolol not tolerated, CAD, bradycardia,Tickosyn needs hospital +/_ side effects, amiodarone & sotalol could need pacer,Multaq less effective not tried, atrial fib ablation could be considered at low risk  . Foot pain     Chronic lateral foot pain-resolved with use of orthotics '09  . Bradycardia   . Aortic stenosis     EF 60-65%, echo, September, 2010 /         .mild...echo..9/ 2010 AI...mild ...echo...9/ 2010  . PVC's (premature ventricular contractions)   . CAD (coronary artery disease)     Myoview March, 2011, hypertensive blood pressure response, no scar or ischemia, EF 61%, mild palpitations at peak stress with infrequent PACs and PVCs.  . Carotid artery disease     Doppler November, 2010, 0-39% bilateral followup 2 years  . Atrial flutter     Status post ablation  . Drug therapy     Coumadin for atrial fib  . Aortic  insufficiency      Mild, echo, 2010 / mild/moderate, echo, 2012  . Drug intolerance     Niaspan  . Elevated bilirubin     Mild chronic elevation, 2.0 January, 2011 stable  . BPH (benign prostatic hyperplasia)   . Drug intolerance     Pindolol before exercise not tolerated  . Ejection fraction     EF 60-65%, echo, September, 2010 / EF 60-65% echo, April, 2012    Past Surgical History  Procedure Date  . Coronary artery bypass graft 2000  . Tonsillectomy     Current Outpatient Prescriptions  Medication Sig Dispense Refill  . aspirin 81 MG tablet Take 81 mg by mouth daily.        Russell Griffith Russell Griffith atorvastatin (LIPITOR) 40 MG tablet Take 20 mg by mouth daily.       . Multiple Vitamin (MULTIVITAMIN) capsule Take 1 capsule by mouth daily.        Russell Griffith Russell Griffith NEXIUM 40 MG capsule TAKE (1) CAPSULE DAILY.  30 each  6  . ramipril (ALTACE) 2.5 MG capsule Take 2.5 mg by mouth daily.        . valACYclovir (VALTREX) 1000 MG tablet Take 1,000 mg by mouth 2 (two) times daily as needed.       . warfarin (COUMADIN) 5 MG tablet Take by mouth as directed.        Russell Griffith Russell Griffith DISCONTD: dofetilide Russell Griffith)  500 MCG capsule Take 500 mcg by mouth 2 (two) times daily.          Allergies  Allergen Reactions  . Sulfonamide Derivatives     Review of Systems negative except from HPI and PMH  Physical Exam Well developed and well nourished in no acute distress HENT normal E scleral and icterus clear Neck Supplel Clear to ausculation Regular rate and rhythm, no murmurs gallops or rub Soft with active bowel sounds No clubbing cyanosis and edema Alert and oriented, grossly normal motor and sensory function Skin Warm and Dry  ECGsinus rhythm at 46 Intervals 0.23 5.09/0.57 with a QTC of 0.50 Axis XLIX  Assessment and  Plan

## 2011-03-26 NOTE — Assessment & Plan Note (Signed)
The patient has atrial fibrillation that is now paroxysmal because of the Tikosyn. Ice told the patient that he will likely have recurrence of atrial fibrillation; the hope will be that the intervals between episodes will become tolerable. In the event that he would have recurrent atrial fibrillation within the next month or so that persisted for more than 24 hours, I would suggest that he undergo cardioversion. If the frequency is maintained in the 1-3 month range, I think it would be time to consider Tikosyn or failure and referral for catheter ablation would be appropriate. I should note that his symptoms are markedly improved in the context of sinus rhythm.  Unfortunately, his QT interval has lengthened considerably. Review from 12 months ago demonstrated a QT c about 400 ms or so. He is at 500 ms today. We will decrease his Tikosyn dose from 500-375 b.i.d. And check his potassium levels. Will have a repeat elective cardiogram done next week.

## 2011-03-26 NOTE — Assessment & Plan Note (Signed)
See discussion above 

## 2011-03-30 ENCOUNTER — Ambulatory Visit (INDEPENDENT_AMBULATORY_CARE_PROVIDER_SITE_OTHER): Payer: 59 | Admitting: Cardiology

## 2011-03-30 ENCOUNTER — Encounter: Payer: Self-pay | Admitting: Cardiology

## 2011-03-30 DIAGNOSIS — I498 Other specified cardiac arrhythmias: Secondary | ICD-10-CM

## 2011-03-30 DIAGNOSIS — I4891 Unspecified atrial fibrillation: Secondary | ICD-10-CM | POA: Insufficient documentation

## 2011-03-30 DIAGNOSIS — E876 Hypokalemia: Secondary | ICD-10-CM

## 2011-03-30 DIAGNOSIS — R001 Bradycardia, unspecified: Secondary | ICD-10-CM

## 2011-03-30 NOTE — Assessment & Plan Note (Signed)
In the past 2 days the patient is holding sinus on 375 mg Tikosyn b.i.d.  QTC today is 440 ms.  Same dose will be continued.  I will see him back in another week to see what his rhythm is and see how he is feeling in general.

## 2011-03-30 NOTE — Assessment & Plan Note (Signed)
Patient's potassium was as low as 3.6 before he was started on Tikosyn.  He received potassium and has had careful followup.  He is taking 10 mEq daily.  His potassium was 3.9 on March 26, 2011.  He will continue 10 mEq of potassium.

## 2011-03-30 NOTE — Assessment & Plan Note (Signed)
He has significant bradycardia.  At this point he seems not to be symptomatic with it.  No change in therapy today.

## 2011-03-30 NOTE — Progress Notes (Signed)
HPI Patient is seen today to followup atrial fibrillation.  On March 20, 2011 the patient was admitted for Tikosyn load.  He converted to sinus after one dose.  He was watched carefully in the hospital and his corrected QT interval remained in the range of 440 ms.  After coming to hospital he had some palpitations.  He was seen back in the office on April 26, 20125 Dr. Graciela Husbands.  His QTC was 500 ms.  Tikosyn dose was decreased to 375 mg b.i.d.  When the past 2 days the patient has felt well.  He has not yet returned to his usual exercise level.  He definitely feels better in sinus rhythm.  It appears also that in sinus rhythm he had a mild spontaneous diuresis.  He did not have a problem with demonstrable fluid overload before that. Allergies  Allergen Reactions  . Sulfonamide Derivatives     Current Outpatient Prescriptions  Medication Sig Dispense Refill  . aspirin 81 MG tablet Take 81 mg by mouth daily.        Marland Mckeever atorvastatin (LIPITOR) 40 MG tablet Take 20 mg by mouth daily.       Marland Furrow dofetilide (TIKOSYN) 125 MCG capsule Take 1 capsule (125 mcg total) by mouth 2 (two) times daily.  60 capsule  3  . dofetilide (TIKOSYN) 250 MCG capsule Take 1 capsule (250 mcg total) by mouth 2 (two) times daily.  60 capsule  3  . Multiple Vitamin (MULTIVITAMIN) capsule Take 1 capsule by mouth daily.        Marland Piechowski NEXIUM 40 MG capsule TAKE (1) CAPSULE DAILY.  30 each  6  . ramipril (ALTACE) 2.5 MG capsule Take 2.5 mg by mouth daily.        . valACYclovir (VALTREX) 1000 MG tablet Take 1,000 mg by mouth 2 (two) times daily as needed.       . warfarin (COUMADIN) 5 MG tablet Take by mouth as directed.          History   Social History  . Marital Status: Married    Spouse Name: N/A    Number of Children: N/A  . Years of Education: N/A   Occupational History  . Not on file.   Social History Main Topics  . Smoking status: Never Smoker   . Smokeless tobacco: Not on file  . Alcohol Use: No  . Drug Use: No  .  Sexually Active: Not on file   Other Topics Concern  . Not on file   Social History Narrative   chapel HIll BA, MBAOccupation:philanthropist at [employer]Married-'70-13 yrs divorce; married '972 daughters-'75, '79; 1 grandchild; step-daughter and step grandsonNever SmokedAlcohol use-yes, 2 glasses wine/dayDrug use-noRegular exercise-yes, runs 1.5-2 mi 4x/wk, also eliptical    Family History  Problem Relation Age of Onset  . Coronary artery disease    . Diabetes    . Hyperlipidemia    . Hypertension      Past Medical History  Diagnosis Date  . CAD (coronary artery disease) of bypass graft     Myoview March, 2011, excellent exercise, hypertensive response, no scar or ischemia, EF 61%, mild palpitations peak stress with infrequent PACs and PVCs.  Marland Karman HTN (hypertension)     Controlled at rest, hypertension on treadmill, sensitive to medications  . Hyperlipidemia     Low HDL  . GERD (gastroesophageal reflux disease)     Barrett's esophagus  . Colonic polyp   . A-fib      Admission for Tikosyn  load, March 20, 2011, converted after first dose, QTC 440 at the time of discharge. / Office note March 27, 2011, clinically in and out of atrial, QTC 500 ms, Dr.Klein decreased Tikosyn to 375mg  BID.  Atrial fib at rest February 25, 2011 / plan to admit for [facility] therapy  . Palpitations      exercise palpitations, probably A. fib-not VT,Pindolol not tolerated, CAD, bradycardia,Tickosyn needs hospital +/_ side effects, amiodarone & sotalol could need pacer,Multaq less effective not tried, atrial fib ablation could be considered at low risk  . Foot pain     Chronic lateral foot pain-resolved with use of orthotics '09  . Bradycardia   . Aortic stenosis     EF 60-65%, echo, September, 2010 /         .mild...echo..9/ 2010 AI...mild ...echo...9/ 2010  . PVC's (premature ventricular contractions)   . Carotid artery disease     Doppler November, 2010, 0-39% bilateral followup 2 years  . Atrial  flutter     Status post ablation  . Drug therapy     Coumadin for atrial fib  . Aortic insufficiency      Mild, echo, 2010 / mild/moderate, echo, 2012  . Drug intolerance     Niaspan  . Elevated bilirubin     Mild chronic elevation, 2.0 January, 2011 stable  . BPH (benign prostatic hyperplasia)   . Drug intolerance     Pindolol before exercise not tolerated  . Ejection fraction     EF 60-65%, echo, September, 2010 / EF 60-65% echo, April, 2012    Past Surgical History  Procedure Date  . Coronary artery bypass graft 2000  . Tonsillectomy     ROS  Patient denies fever, chills, headache, sweats, rash, change in vision, change in hearing, chest pain, cough, nausea vomiting, urinary symptoms.  All other systems are reviewed and are negative  PHYSICAL EXAM Patient is quite stable today.  He is oriented to person time and place.  Affect is normal.  There is no xanthelasma.  Lungs are clear.  Respiratory effort is nonlabored.  Cardiac exam reveals S1-S2.  No clicks or significant murmurs.  The abdomen is soft.  There is no peripheral edema.  There are no musculoskeletal deformities.Ceasar Mons Vitals:   03/30/11 0944  BP: 123/63  Pulse: 41  Resp: 18  Height: 5\' 11"  (1.803 m)  Weight: 196 lb (88.905 kg)    EKG   EKG is done today and reviewed by me.  He has marked sinus bradycardia.  His corrected QT interval is 440 ms.  ASSESSMENT & PLAN

## 2011-03-30 NOTE — Patient Instructions (Signed)
Your physician recommends that you schedule a follow-up appointment in: 1 week   

## 2011-04-03 ENCOUNTER — Other Ambulatory Visit (INDEPENDENT_AMBULATORY_CARE_PROVIDER_SITE_OTHER): Payer: 59

## 2011-04-03 ENCOUNTER — Other Ambulatory Visit: Payer: Self-pay | Admitting: Internal Medicine

## 2011-04-03 DIAGNOSIS — Z0000 Encounter for general adult medical examination without abnormal findings: Secondary | ICD-10-CM

## 2011-04-03 DIAGNOSIS — Z0389 Encounter for observation for other suspected diseases and conditions ruled out: Secondary | ICD-10-CM

## 2011-04-03 LAB — BASIC METABOLIC PANEL
BUN: 24 mg/dL — ABNORMAL HIGH (ref 6–23)
CO2: 27 mEq/L (ref 19–32)
Calcium: 9.2 mg/dL (ref 8.4–10.5)
Creatinine, Ser: 1.1 mg/dL (ref 0.4–1.5)
GFR: 71.06 mL/min (ref >60.00)
Glucose, Bld: 94 mg/dL (ref 70–99)

## 2011-04-03 LAB — URINALYSIS
Bilirubin Urine: NEGATIVE
Leukocytes, UA: NEGATIVE
Nitrite: NEGATIVE
Specific Gravity, Urine: 1.025 (ref 1.000–1.030)
Total Protein, Urine: NEGATIVE
pH: 5.5 (ref 5.0–8.0)

## 2011-04-03 LAB — CBC WITH DIFFERENTIAL/PLATELET
Basophils Relative: 0.3 % (ref 0.0–3.0)
Eosinophils Absolute: 0.2 10*3/uL (ref 0.0–0.7)
HCT: 43 % (ref 39.0–52.0)
Hemoglobin: 14.7 g/dL (ref 13.0–17.0)
Lymphocytes Relative: 25.8 % (ref 12.0–46.0)
Lymphs Abs: 1.7 10*3/uL (ref 0.7–4.0)
MCHC: 34.3 g/dL (ref 30.0–36.0)
Monocytes Relative: 13.1 % — ABNORMAL HIGH (ref 3.0–12.0)
Neutro Abs: 3.9 10*3/uL (ref 1.4–7.7)
RBC: 4.8 Mil/uL (ref 4.22–5.81)

## 2011-04-03 LAB — LIPID PANEL
HDL: 29.2 mg/dL — ABNORMAL LOW (ref >39.00)
Total CHOL/HDL Ratio: 4

## 2011-04-03 LAB — HEPATIC FUNCTION PANEL
Albumin: 3.6 g/dL (ref 3.5–5.2)
Alkaline Phosphatase: 70 U/L (ref 39–117)
Total Bilirubin: 1.9 mg/dL — ABNORMAL HIGH (ref 0.3–1.2)

## 2011-04-06 ENCOUNTER — Encounter: Payer: Self-pay | Admitting: Cardiology

## 2011-04-07 ENCOUNTER — Encounter: Payer: Self-pay | Admitting: Internal Medicine

## 2011-04-08 ENCOUNTER — Ambulatory Visit (INDEPENDENT_AMBULATORY_CARE_PROVIDER_SITE_OTHER): Payer: 59 | Admitting: Cardiology

## 2011-04-08 ENCOUNTER — Encounter: Payer: Self-pay | Admitting: Cardiology

## 2011-04-08 VITALS — BP 127/65 | HR 43 | Ht 71.0 in | Wt 197.0 lb

## 2011-04-08 DIAGNOSIS — E876 Hypokalemia: Secondary | ICD-10-CM

## 2011-04-08 DIAGNOSIS — I25810 Atherosclerosis of coronary artery bypass graft(s) without angina pectoris: Secondary | ICD-10-CM

## 2011-04-08 DIAGNOSIS — I4891 Unspecified atrial fibrillation: Secondary | ICD-10-CM

## 2011-04-08 DIAGNOSIS — Z7901 Long term (current) use of anticoagulants: Secondary | ICD-10-CM

## 2011-04-08 LAB — POCT INR: INR: 2.8

## 2011-04-08 MED ORDER — ATORVASTATIN CALCIUM 20 MG PO TABS: 20.0000 mg | ORAL_TABLET | Freq: Every day | ORAL | Status: DC

## 2011-04-08 MED ORDER — POTASSIUM CHLORIDE CRYS ER 20 MEQ PO TBCR: 20.0000 meq | EXTENDED_RELEASE_TABLET | Freq: Every day | ORAL | Status: DC

## 2011-04-08 NOTE — Assessment & Plan Note (Signed)
Coronary disease is stable. No change in therapy. 

## 2011-04-08 NOTE — Patient Instructions (Signed)
Increase Potassium to 20 meq Your physician recommends that you return for lab work in: 1 week (bmet 427.31) Your physician recommends that you schedule a follow-up appointment in: 4 weeks

## 2011-04-08 NOTE — Assessment & Plan Note (Signed)
It is not clear to me why the patient is requiring potassium.  As of now he is on 20 mEq daily.  He will have a followup be met in one week to be sure that he does not develop hyperkalemia.  I will see if Dr. Debby Bud has any thoughts about his potassium requirement.  I will plan to see the patient back in 4 weeks.

## 2011-04-08 NOTE — Assessment & Plan Note (Signed)
At this point the situation is stable.  He is tolerating the medication at 375 mg b.i.d.  The QTC is stable at 441 ms.  No change will be made in his medication at this time.

## 2011-04-08 NOTE — Progress Notes (Signed)
HPI Patient is seen for followup treatment of atrial fibrillation.  I saw him last March 30, 2011.  At that time he remained stable on Tikosyn 375 mg b.i.d.  On that day his QTC was 440 ms.  He is seen back today for followup.  He is feeling well.  He thinks that he is in sinus rhythm 90% of the time.  He feels much better when he is in sinus.  He tolerates the other times.  He is able to exercise better.  Before he was admitted for the Tikosyn load his potassium was 3.6.  On 10 mEq of potassium daily his potassium went to 3.9.  On 20 mEq daily his potassium was up to 4.6 as of last week.  It is not clear why he is requiring daily potassium.  However it will be continued for now.  It is very important that his potassium remained over 4.0 on this drug.  I certainly do not want him to develop hyperkalemia however.  I realize as I dictate this note that I do not know his magnesium level. Allergies  Allergen Reactions  . Sulfonamide Derivatives     Current Outpatient Prescriptions  Medication Sig Dispense Refill  . aspirin 81 MG tablet Take 81 mg by mouth daily.        Marland Schipani atorvastatin (LIPITOR) 20 MG tablet Take 1 tablet (20 mg total) by mouth daily.  30 tablet  12  . dofetilide (TIKOSYN) 125 MCG capsule Take 1 capsule (125 mcg total) by mouth 2 (two) times daily.  60 capsule  3  . dofetilide (TIKOSYN) 250 MCG capsule Take 1 capsule (250 mcg total) by mouth 2 (two) times daily.  60 capsule  3  . Multiple Vitamin (MULTIVITAMIN) capsule Take 1 capsule by mouth daily.        Marland Marinaccio NEXIUM 40 MG capsule TAKE (1) CAPSULE DAILY.  30 each  6  . ramipril (ALTACE) 2.5 MG capsule Take 2.5 mg by mouth daily.        . valACYclovir (VALTREX) 1000 MG tablet Take 1,000 mg by mouth 2 (two) times daily as needed.       . warfarin (COUMADIN) 5 MG tablet Take by mouth as directed.        Marland Streeper DISCONTD: atorvastatin (LIPITOR) 40 MG tablet Take 20 mg by mouth daily.       . potassium chloride SA (K-DUR,KLOR-CON) 20 MEQ tablet  Take 1 tablet (20 mEq total) by mouth daily.  30 tablet  11  . DISCONTD: azithromycin (ZITHROMAX) 250 MG tablet Take 2 tablets by mouth on day 1, followed by 1 tablet by mouth daily for 4 days. Take as directed         History   Social History  . Marital Status: Married    Spouse Name: N/A    Number of Children: 2  . Years of Education: N/A   Occupational History  .     Social History Main Topics  . Smoking status: Never Smoker   . Smokeless tobacco: Not on file  . Alcohol Use: Yes     2 glasses of wine per day  . Drug Use: No  . Sexually Active: Not on file   Other Topics Concern  . 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 Coronary artery disease    . Diabetes    . Hyperlipidemia    . Hypertension      Past Medical History  Diagnosis Date  . CAD (coronary artery disease) of bypass graft     Myoview March, 2011, excellent exercise, hypertensive response, no scar or ischemia, EF 61%, mild palpitations peak stress with infrequent PACs and PVCs.  Marland Manning HTN (hypertension)     Controlled at rest, hypertension on treadmill, sensitive to medications  . Hyperlipidemia     Low HDL  . GERD (gastroesophageal reflux disease)     Barrett's esophagus  . Colonic polyp   . A-fib      Admission for Tikosyn load, March 20, 2011, converted after first dose, QTC 440 at the time of discharge. / Office note March 27, 2011, clinically in and out of atrial, QTC 500 ms, Dr.Klein decreased Tikosyn to 375mg  BID.  Atrial fib at rest February 25, 2011 / plan to admit for [facility] therapy  . Palpitations      exercise palpitations, probably A. fib-not VT,Pindolol not  tolerated, CAD, bradycardia,Tickosyn needs hospital +/_ side effects, amiodarone & sotalol could need pacer,Multaq less effective not tried, atrial fib ablation could be considered at low risk  . Foot pain     Chronic lateral foot pain-resolved with use of orthotics '09  . Bradycardia   . Aortic stenosis     EF 60-65%, echo, September, 2010 /         .mild...echo..9/ 2010 AI...mild ...echo...9/ 2010  . PVC's (premature ventricular contractions)   . Carotid artery disease     Doppler November, 2010, 0-39% bilateral followup 2 years  . Atrial flutter     Status post ablation  . Drug therapy     Coumadin for atrial fib  . Aortic insufficiency      Mild, echo, 2010 / mild/moderate, echo, 2012  . Drug intolerance     Niaspan  . Elevated bilirubin     Mild chronic elevation, 2.0 January, 2011 stable  . BPH (benign prostatic hyperplasia)   . Drug intolerance     Pindolol before exercise not tolerated  . Ejection fraction     EF 60-65%, echo, September, 2010 / EF 60-65% echo, April, 2012  . Hypokalemia     Potassium 3.6 before potassium started April, 2012  . Hx of colonoscopy     Past Surgical History  Procedure Date  . Coronary artery bypass graft 2000  . Tonsillectomy     ROS  Patient denies fever, chills, headache, sweats, rash, change in vision, change in hearing, chest pain, cough, nausea vomiting, urinary symptoms.  All other systems are reviewed and are negative.  PHYSICAL EXAM Patient was not examined today. Filed Vitals:   04/08/11 1548  BP: 127/65  Pulse: 43  Height: 5\' 11"  (1.803 m)  Weight: 197 lb (89.359 kg)    EKG EKG today reveals sinus bradycardia with a rate of 43.  There are diffuse nonspecific ST-T wave changes.  The QTC is 441 ms. ASSESSMENT & PLAN

## 2011-04-09 ENCOUNTER — Encounter: Payer: 59 | Admitting: *Deleted

## 2011-04-09 ENCOUNTER — Ambulatory Visit (INDEPENDENT_AMBULATORY_CARE_PROVIDER_SITE_OTHER): Payer: 59 | Admitting: Internal Medicine

## 2011-04-09 ENCOUNTER — Encounter: Payer: Self-pay | Admitting: Internal Medicine

## 2011-04-09 VITALS — BP 116/64 | HR 47 | Temp 98.0°F | Wt 194.0 lb

## 2011-04-09 DIAGNOSIS — Z0000 Encounter for general adult medical examination without abnormal findings: Secondary | ICD-10-CM

## 2011-04-12 ENCOUNTER — Encounter: Payer: Self-pay | Admitting: Internal Medicine

## 2011-04-12 NOTE — Progress Notes (Signed)
Subjective:    Patient ID: Russell Griffith, male    DOB: 02-Aug-1946, 65 y.o.   MRN: [id number]  HPI Russell Griffith presents for routine physical exam. He has recently been hospitalized for initiation of tikosyn for control of atrial fibrillation. He has also seen Dr. Myrtis Ser on several occasion before and after this hospitalization. He has done well with tikosyn and reports he is in sinus rhythm more than 90% of the time. He feels much better, especially when he does his aerobic exercise. His interval history has otherwise been unremarkable.   Past Medical History  Diagnosis Date  . CAD (coronary artery disease) of bypass graft     Myoview March, 2011, excellent exercise, hypertensive response, no scar or ischemia, EF 61%, mild palpitations peak stress with infrequent PACs and PVCs.  Russell Griffith HTN (hypertension)     Controlled at rest, hypertension on treadmill, sensitive to medications  . Hyperlipidemia     Low HDL  . GERD (gastroesophageal reflux disease)     Barrett's esophagus  . Colonic polyp   . A-fib      Admission for Tikosyn load, March 20, 2011, converted after first dose, QTC 440 at the time of discharge. / Office note March 27, 2011, clinically in and out of atrial, QTC 500 ms, Dr.Klein decreased Tikosyn to 375mg  BID.  Atrial fib at rest February 25, 2011 / plan to admit for [facility]  . Palpitations      exercise palpitations, probably A. fib-not VT,Pindolol not tolerated, CAD, bradycardia,Tickosyn needs hospital +/_ side effects, amiodarone & sotalol could need pacer,Multaq less effective not tried, atrial fib ablation could be considered at low risk  . Foot pain     Chronic lateral foot pain-resolved with use of orthotics '09  . Bradycardia   . Aortic stenosis     EF 60-65%, echo, September, 2010 /         .mild...echo..9/ 2010 AI...mild ...echo...9/ 2010  . PVC's (premature ventricular contractions)   . Carotid artery disease     Doppler November, 2010, 0-39% bilateral followup 2  years  . Atrial flutter     Status post ablation  . Drug therapy     Coumadin for atrial fib  . Aortic insufficiency      Mild, echo, 2010 / mild/moderate, echo, 2012  . Drug intolerance     Niaspan  . Elevated bilirubin     Mild chronic elevation, 2.0 January, 2011 stable  . BPH (benign prostatic hyperplasia)   . Drug intolerance     Pindolol before exercise not tolerated  . Ejection fraction     EF 60-65%, echo, September, 2010 / EF 60-65% echo, April, 2012  . Hypokalemia     Potassium 3.6 before potassium started April, 2012  . Hx of colonoscopy    Past Surgical History  Procedure Date  . Coronary artery bypass graft 2000  . Tonsillectomy    Family History  Problem Relation Age of Onset  . Coronary artery disease    . Diabetes    . Hyperlipidemia    . Hypertension     History   Social History  . Marital Status: Married    Spouse Name: N/A    Number of Children: 2  . Years of Education: 18   Occupational History  . [employer]   Social History Main Topics  . Smoking status: Never Smoker   . Smokeless tobacco: Never Used  . Alcohol Use: Yes  2 glasses of wine per day  . Drug Use: No  . Sexually Active: Yes -- Male partner(s)   Other Topics Concern  . Not on file   Social History Narrative   [employer] [place], [place].  Occupation:philanthropist at [employer]  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  2 daughters-'75, '79; 1 grandchild; step-daughter and step grandson.  Regular exercise-yes, runs 1.5-2 mi 4x/wk, also eliptical       Review of Systems Review of Systems  Constitutional:  Negative for fever, chills, activity change and unexpected weight change.  HENT:  Negative for hearing loss, ear pain, congestion, neck stiffness and postnasal drip.   Eyes: Negative for pain, discharge and visual disturbance.  Respiratory: Negative for chest tightness and wheezing.   Cardiovascular: Negative for chest pain  and palpitations, except for occasional lapse into A. fib.       [No decreased exercise tolerance Gastrointestinal: [No change in bowel habit. No bloating or gas. No reflux or indigestion Genitourinary: Negative for urgency, frequency, flank pain and difficulty urinating.  Musculoskeletal: Negative for myalgias, back pain, arthralgias and gait problem.  Neurological: Negative for dizziness, tremors, weakness and headaches.  Hematological: Negative for adenopathy.  Psychiatric/Behavioral: Negative for behavioral problems and dysphoric mood.       Objective:   Physical Exam Constitutional: He is oriented to person, place, and time. He appears well-developed and well-nourished.       Healthy appearing white male in no acute distress  HENT:  Head: Normocephalic and atraumatic.  Right Ear: External ear normal. EAC/TM nl Left Ear: External ear normal.  EAC/TM nl Nose: Nose normal.  Mouth/Throat: Oropharynx is clear and moist.  Eyes: Conjunctivae and EOM are normal. Pupils are equal, round, and reactive to light. Right eye exhibits no discharge. Left eye exhibits no discharge. No scleral icterus.  Neck: Normal range of motion. Neck supple. No JVD present. No tracheal deviation present. No thyromegaly present.  Cardiovascular: Normal rate, regular rhythm and normal heart sounds.  Exam reveals no gallop and no friction rub.   No murmur heard.      Quiet precordium. 2+ radial and DP pulses  Pulmonary/Chest: Effort normal. No respiratory distress. He has no wheezes. He has no rales. He exhibits no tenderness.       No chest wall deformity  Abdominal: Soft. Bowel sounds are normal. He exhibits no distension. There is no tenderness. There is no rebound and no guarding.       No heptosplenomegaly  Genitourinary: Deferred - normal PSA Musculoskeletal: Normal range of motion. He exhibits no edema and no tenderness.       Small and large joints without redness, synovial thickening or deformity. Full  range of motion preserved about all small, median and large joints.  Lymphadenopathy:    He has no cervical adenopathy.  Neurological: He is alert and oriented to person, place, and time. He has normal reflexes. No cranial nerve deficit. Coordination normal.  Skin: Skin is warm and dry. No rash noted. No erythema.  Psychiatric: He has a normal mood and affect. His behavior is normal. Thought content normal.   Lab Results  Component Value Date   WBC 6.7 04/03/2011   HGB 14.7 04/03/2011   HCT 43.0 04/03/2011   PLT 190.0 04/03/2011   CHOL 120 04/03/2011   TRIG 51.0 04/03/2011   HDL 29.20* 04/03/2011   ALT 28 04/03/2011   AST 26 04/03/2011   NA 141 04/03/2011   K 4.6 04/03/2011   CL 108  04/03/2011   CREATININE 1.1 04/03/2011   BUN 24* 04/03/2011   CO2 27 04/03/2011   TSH 3.49 04/03/2011   PSA 1.38 04/03/2011   INR 2.8 04/08/2011   Lab Results  Component Value Date   LDLCALC 81 04/03/2011        Current Outpatient Prescriptions on File Prior to Visit  Medication Sig Dispense Refill  . aspirin 81 MG tablet Take 81 mg by mouth daily.        Russell Griffith atorvastatin (LIPITOR) 20 MG tablet Take 1 tablet (20 mg total) by mouth daily.  30 tablet  12  . dofetilide (TIKOSYN) 250 MCG capsule Take 1 capsule (250 mcg total) by mouth 2 (two) times daily.  60 capsule  3  . Multiple Vitamin (MULTIVITAMIN) capsule Take 1 capsule by mouth daily.        Russell Griffith NEXIUM 40 MG capsule TAKE (1) CAPSULE DAILY.  30 each  6  . potassium chloride SA (K-DUR,KLOR-CON) 20 MEQ tablet Take 1 tablet (20 mEq total) by mouth daily.  30 tablet  11  . ramipril (ALTACE) 2.5 MG capsule Take 2.5 mg by mouth daily.        . valACYclovir (VALTREX) 1000 MG tablet Take 1,000 mg by mouth 2 (two) times daily as needed.       . warfarin (COUMADIN) 5 MG tablet Take by mouth as directed.                Assessment & Plan:  1. Atrial Fibrillation - he is holding sinus rhythm a majority of the time. His tikosyn dose is actually 375mg  daily. He follows with Dr. Myrtis Ser and  Dr. Ladona Ridgel.  2. CAD- stable and doing well  3. Hyperlipidemia - great LDL control; HDL remains low. Would not add more medications at this time  4. Health Maintenance - interval history as above. Limited physical exam is normal. Lab results are within normal limits, including postassium. He is current with prostate cancer screening with a normal PSA. He is current with colorectal cancer screening with last colonoscopy May '11. Immunizations: current with tetnus and pneumonia vaccine. He has had shingles and vaccination is optional with a caveat of no trials to show reduced incidence of recurrent outbreaks from the known level of 5%. Most importantly he feels well.  In summary- a very nice man who is tolerating tikosyn well with good control and is otherwise medically stable. He will continue all his present medications. He will continue his exercise regimen. He will return as needed and will follow-up with Dr. Myrtis Ser as instructed.

## 2011-04-14 NOTE — Assessment & Plan Note (Signed)
[facility]                         GASTROENTEROLOGY OFFICE NOTE   Breccan, Galant IMMANUEL FEDAK                       MRN:          [id number]  DATE:07/11/2007                            DOB:          [birth year]    PRIMARY CARE PHYSICIAN:  Rosalyn Gess. Norins, MD.   GI PROBLEM LIST:  1. History of Barrett's esophagus. No dysplasia seen on serial      endoscopies 2004, 2005, 2007. Next EGD June 1999. No alarm      symptoms.  2. History of colon polyps. Question pathology was removed by hot      biopsy not sent for pathology 2003. Followup colonoscopy 2004 found      no polyps. Next colonoscopy per Dr. Hilda Blades recommendation was for      2012, I think this is a good interval.   INTERVAL HISTORY:  Mr. Luczak was last seen around the time of his EGD  by Dr. Doreatha Martin.  He came in simply to discuss Barrett's surveillance and  polyp surveillance options. He has not GERD symptoms as long as he stays  on his Nexium on a daily basis. He has no alarm symptoms of dysphagia,  bleeding, weight loss.   CURRENT MEDICATIONS:  Vytorin, Altace, Nexium, Coumadin, baby aspirin,  multivitamin.   PHYSICAL EXAMINATION:  VITAL SIGNS:  Weight 193 pounds, blood pressure  112/52, pulse 56.  CONSTITUTIONAL:  Generally well appearing. Alert and oriented x3.   ASSESSMENT/PLAN:  A 65 year old man with a history of colon polyps  (unknown pathology), Barrett's esophagus without dysplasia.   We discussed lifestyle modifications for continuing gastroesophageal  reflux disease control. His symptoms are very well controlled on once  daily Nexium and he has no alarm symptoms. We discussed surveillance  intervals and agreed to perform EGDs every 2 years in the absence of any  dysplasia. He knows to get in touch if he has any alarm symptoms and we  discussed those at length. We also discussed polyps surveillance  intervals and I think that Dr. Hilda Blades recommendations of a colonoscopy in  2012 is very  reasonable and we will keep it in the reminder system.     Rachael Fee, MD  Electronically Signed    DPJ/MedQ  DD: 07/11/2007  DT: 07/12/2007  Job #: [id number]   cc:   Rosalyn Gess. Norins, MD

## 2011-04-14 NOTE — Assessment & Plan Note (Signed)
[facility]                            CARDIOLOGY OFFICE NOTE   Russell Griffith, Russell Griffith                       MRN:          [id number]  DATE:10/04/2007                            DOB:          1946/03/20    Russell Griffith is doing well.  He has known coronary disease and atrial  arrhythmias.  He has not had any chest pain.  He has learned to live  with his intermittent episodes of atrial fib and flutter.  There has  been very careful attention paid also by Dr. Ladona Ridgel with the approach of  his arrhythmias.  He is not having any chest pain.  He has no major  shortness of breath.  There has been no syncope or presyncope.  He is  going about full activities.   PAST MEDICAL HISTORY:  Other medical problems, see the list below.   ALLERGIES:  SULFA.   MEDICATIONS:  1. Vytorin 10/20.  2. Altace 2.5.  3. Nexium.  4. Coumadin.  5. Aspirin 81.  6. Multivitamins.   REVIEW OF SYSTEMS:  His review of systems is really negative.  As  mentioned, he does feel palpitations at times, but he is used to this  and is feeling well with it.  It is not limiting.  Otherwise, his review  of systems is negative.   PHYSICAL EXAMINATION:  Weight is 194 pounds.  Blood pressure 115/67 with  a pulse of 48.  HEENT:  No xanthelasma.  He has normal extraocular motion.  NECK:  There is a soft right carotid bruit.  There is no jugular venous  distention.  LUNGS:  Clear.  Respiratory effort is not labored.  CARDIAC:  An S1 with an S2.  There is a 2/6 systolic murmur.  ABDOMEN:  Soft.  There are masses or bruits.  EXTREMITIES:  He has no significant peripheral edema.   EKG reveals old anterior T wave changes.  He has sinus bradycardia.   PROBLEMS:  Listed completely on my note of March 21, 2007.  1. History of symptomatic bradycardia while on Coreg in the past.  His      rate is still slow, but it is stable.  2. Paroxysmal atrial fibrillation and some flutter over time, status   post flutter ablation in the past.  See Dr. Lubertha Basque note of May,      2008.  For now, we will continue Coumadin and continue the current      medication.  3. Coronary disease:  Status post coronary artery bypass graft.  His      last Myoview was in June, 2007.  We will repeat one in January or      February of 2009.  4. History of mild carotid bruits.  A Doppler in April, 2006 showed      mild disease.  We will now plan to repeat this.  5. Hyperlipidemia:  On medication.  He will have a fasting lipid      profile.  He did not tolerate Niaspan in the past.  We may consider      a retrial  in the future with other preparations.  6. Aortic valve sclerosis:  Echo in 2007 revealed mild aortic      insufficiency and mild aortic valve sclerosis.  No repeat echo was      needed now.  7. Question of some swelling in 2008 but there was no DVT.  8. Feeling of his arrhythmias, including premature atrial      contractions, premature ventricular contractions, atrial      fibrillation, and atrial flutter.  He is tolerating this well at      this time, and there is no change needed.   He will have fasting lipid profile.  He will have an exercise Myoview in  January or February, 2009.  He will have carotid Doppler.  Will be in  touch with him with the results.     Luis Abed, MD, [facility]  Electronically Signed    JDK/MedQ  DD: 10/04/2007  DT: 10/04/2007  Job #: [id number]

## 2011-04-14 NOTE — Assessment & Plan Note (Signed)
[facility]                            CARDIOLOGY OFFICE NOTE   Lamontae, Ricardo CAYDAN MCTAVISH                       MRN:          [id number]  DATE:03/30/2007                            DOB:          September 10, 1946    OFFICE NOTE   Mr. Sevey was last seen in the office on March 21, 2007.  He is  wearing a PDS heart monitor.  He actually did not get the monitor in  place until March 28, 2007.  He had some symptoms that evening, and he  tells me that he did record at that time.  He did not have any symptoms  during the middle of the night.  He did not call in his information  until 7:00 on April 29.  At that time, the PDS service noted that he had  had some atrial arrhythmias and asked him to record again, and he was  back in sinus rhythm.   The strips are carefully reviewed today with Dr. Ladona Ridgel.  It does appear  that he probably is having some atrial flutter, and that it most likely  is left atrial.  The rate is well controlled.   At this point, I cannot be absolutely sure of the time of the recordings  relative to some of his symptoms.  It does appear that he had some of  the atrial flutter at 3:00 in the morning, while he was sleeping.  He  definitely did not have symptoms at that time.   I had told the patient on the telephone the other day that I had seen  the recordings and that he should continue to record various symptoms  over the next several weeks.  He is scheduled to see Dr. Ladona Ridgel.  There  will be careful attention to the issues of the patient's bradycardia  historically, and the fact that the rhythm we are seeing now is not  rapid.  There is no reason to add any medications as of today.     Luis Abed, MD, [facility]  Electronically Signed    JDK/MedQ  DD: 03/30/2007  DT: 03/30/2007  Job #: [id number]

## 2011-04-14 NOTE — Assessment & Plan Note (Signed)
[facility]                         ELECTROPHYSIOLOGY OFFICE NOTE   CARLSON, BELLAND                       MRN:          [id number]  DATE:04/08/2007                            DOB:          1946/03/13    REFERRING PHYSICIAN:  Luis Abed, MD, [facility]   Mr. Kissling returns today for followup. He is a very pleasant, middle-  aged man with an ischemic heart disease with preserved LV function,  history of atrial flutter status post ablation, history of atrial  fibrillation now on Coumadin with symptomatic bradycardia and sinus node  dysfunction. The patient returns today for additional followup on the  request of Dr. Jerral Bonito. The patient has continued to have palpitations  and irregular heartbeats. He wore a cardiac monitor demonstrating both  atypical atrial flutter as well as A fib as well as frequent PACs. The  patient states that his symptoms do not last particularly long,  typically just a few minutes in duration but have become more frequent  in the last several months and overall more frequent in the last year or  two.   MEDICATIONS:  Vytorin, Altace, Nexium, Coumadin, low dose aspirin and a  multivitamin.   The patient has worn a cardiac monitor which demonstrates sinus rhythm  with a PAC and also episodes of atrial fibrillation. There were no  ventricular arrhythmias noted. The predominance of his arrhythmias are  in fact PACs.   PHYSICAL EXAMINATION:  GENERAL:  Pleasant, well-appearing, middle-aged  man in no acute distress.  VITAL SIGNS:  Blood pressure today was 102/68, the pulse was 56 and  regular, the respirations were 18, the weight was 199 pounds.  NECK:  Revealed no jugular venous distention.  LUNGS:  Clear bilaterally to auscultation. No wheezes, rales or rhonchi.  CARDIAC:  Regular rate and rhythm with normal S1 and S2.  NECK:  There were no jugular venous distentions. There was a soft  carotid bruit present.  EXTREMITIES:  Demonstrated no cyanosis, clubbing or edema.   IMPRESSION:  1. Paroxysmal atrial fibrillation.  2. Symptomatic premature atrial contractions.  3. History of atrial flutter status post ablation.  4. Sinus node dysfunction.  5. Ischemic heart disease with preserved left ventricular function.   DISCUSSION:  Mr. Sluka is stable. We spent approximately 20 minutes  today discussing the different treatment options for his paroxysmal  atrial fibrillation and symptomatic PACs. I would prefer to give him a  beta blocker which might well help, however, the patient is bradycardic  on beta blockers and has sinus node dysfunction making these medications  unwarranted. Another alternative would be to try him on an  antiarrhythmic drug like Tikosyn which would typically not cause  worsening sinus node dysfunction; however, because he is not severely  symptomatic from his atrial arrhythmias and because of the potential  proarrhythmic risks of Tikosyn as well as the requirement of inpatient  hospitalization for 3-4 days while initiation of Tikosyn is carried out,  I have recommended that he not in fact proceed with Tikosyn therapy.  Ultimately my expectation is that over time he will develop  more and  more sinus dysfunction and will require backup pacing and when that ends  up being the case other antiarrhythmic drugs or rate controlling drugs  could be used to help control his symptoms. For now I have recommended a  period of watchful waiting and that he continue his present exercise  regimen, continue his present medical therapy and see me back in the  office on a p.r.n. basis.     Doylene Canning. Ladona Ridgel, MD  Electronically Signed    GWT/MedQ  DD: 04/08/2007  DT: 04/08/2007  Job #: [id number]   cc:   Luis Abed, MD, [facility]

## 2011-04-14 NOTE — Assessment & Plan Note (Signed)
[facility]                         ELECTROPHYSIOLOGY OFFICE NOTE   Russell Griffith                       MRN:          [id number]  DATE:11/27/2008                            DOB:          1946-07-13    Russell Griffith returns today for followup.  He is a very pleasant middle-  aged man with premature coronary artery disease status post bypass  surgery.  He has a history of hypertension, he has a history of sinus  node dysfunction, he has a history of atrial flutter status post  ablation.  He subsequently developed atrial fibrillation, which has been  for the most part well controlled and he returns today for followup.  The patient had noted increasing palpitations over the last several  months, although he was still able to exercise.  Some days were good  with no symptoms, although other days (most days) are notable for  intermittent palpitations.  He has had no syncope, is otherwise been  stable.  He states that when he exercises, his heart is beating  irregularly.  His energy level is not quite as good and he is more tired  afterwards.  Otherwise, no particular changes in his symptom complex.   CURRENT MEDICATIONS:  1. Altace 2.5 a day.  2. Nexium 40 a day.  3. Coumadin as directed.  4. Aspirin 81 a day.  5. Crestor 10 a day.   PAST MEDICAL HISTORY:  Also notable for dyslipidemia, gastroesophageal  reflux disease.   PHYSICAL EXAMINATION:  GENERAL:  He is a pleasant, well-appearing middle  aged man in no acute distress.  VITAL SIGNS:  Blood pressure today was 115/66, the pulse 74 and regular,  the respirations were 18, weight was 198 pounds.  NECK:  No jugular venous distention.  LUNGS:  Clear bilaterally to auscultation.  No wheezes, rales, or  rhonchi.  CARDIOVASCULAR:  Irregular bradycardia with normal S1 and S2.  There are  no obvious murmurs.  ABDOMINAL:  Soft, nontender.  EXTREMITIES:  Demonstrated no edema.   His EKG demonstrates  underlying sinus bradycardia with PACs in a  bigeminal fashion followed by sinus PAC and a junctional beat in this  triphasic pattern.   IMPRESSION:  1. Symptomatic palpitations.  2. Documented sinus bradycardia.  3. Paroxysmal atrial fibrillation.  4. Chronic Coumadin therapy.  5. Ischemic heart disease with previously normal left ventricular      function.   DISCUSSION:  At this point, the real question is how much atrial ectopy  and sinus pausing does the patient have.  There is no clear-cut  indication at the present time based on his symptoms for pacemaker,  although I do think that he has some symptomatic bradycardia, it sounds  like most of his symptoms have related to irregular heartbeats.  We will  have him undergo 48-hour Holter monitoring, so we can better  characterize the density of his atrial ectopy, as well as his pauses and  based on the results of his cardiac monitor, we will consider additional  evaluation.  The patient may ultimately end up with permanent pacemaker  insertion for  control of his bradycardia and antiarrhythmic drug therapy  for his AFib, but we will defer the final decision on these things until  after his cardiac monitor has been obtained.  We will see him back in  several weeks after his monitoring.     Doylene Canning. Ladona Ridgel, MD  Electronically Signed    GWT/MedQ  DD: 11/27/2008  DT: 11/28/2008  Job #: [id number]

## 2011-04-14 NOTE — Assessment & Plan Note (Signed)
[facility]                               LIPID CLINIC NOTE   Russell [facility], Russell [facility]                       MRN:          [id number]  DATE:08/16/2008                            DOB:          1946-05-01    [name] is seen in the Lipid Clinic for further evaluation and  medication titration associated with hyperlipidemia or dyslipidemia in  the setting of low HDL cholesterol.  [name] is a pleasant gentleman  well known to me in the Anticoagulation Clinic.  He has been maintained  on Vytorin 10/20 for an extended period of time.  He has had no muscle  aches, pain, weakness, fatigue, or other problems.  In review of his  risk factors, we found that his HDL has remained low for an extended  period.  The patient does have documented coronary disease, status post  atherosclerotic vascular disease requiring bypass.  The patient has been  exercising.  He is using the elliptical machine on a regular basis now.  He has stopped running due to knee issue, leg issue.  He has had no  muscle aches, pains, weakness, fatigue, or other problems.  He has  followed low-fat, low-cholesterol diet.  His wife prepares using olive  oil, and they eat fish 3 times a week.  Review of systems is as stated  in the HPI, otherwise negative.   CURRENT MEDICATIONS:  1. Vytorin 10/20 one tablet daily.  2. Altace 2.5 mg daily.  3. Nexium 40 mg daily.  4. Coumadin as directed by the Anticoagulation Clinic here at [facility].  5. Baby aspirin 81 mg daily.  6. Multivitamin daily.   REVIEW OF SYSTEMS:  As stated in the HPI, otherwise negative.   PHYSICAL EXAMINATION:  VITAL SIGNS:  Weight today is 192 pounds, blood  pressure is 110/70, heart rate is 55.   ASSESSMENT:  The patient has dyslipidemia.  We have discussed various  therapies for this, which include addition of niacin.  He has asked me  specific questions regarding Trilipix, which he saw on advertisement  for,  and we have talked about the potential role for his children and  appropriate times to obtain a Lipo-Science profile.  The patient will  discontinue his Vytorin 10/20 and begin Crestor 10 mg daily.  Samples  have been provided to the patient to facilitate compliance.  He will  call with muscle aches, pains, weakness, fatigue, or other problems.  He  will call if needed sooner, but in approximately 8 weeks, the patient  will have lipid labs drawn and will follow up accordingly.      [facility], PharmD, BCPS, CPP  Electronically Signed      Russell Abed, MD, [facility]  Electronically Signed   MP/MedQ  DD: 08/24/2008  DT: 08/25/2008  Job #: [id number]   cc:   Russell Abed, MD, [facility]

## 2011-04-14 NOTE — Assessment & Plan Note (Signed)
[facility]                            CARDIOLOGY OFFICE NOTE   Jameon, Deller DERION KREITER                       MRN:          [id number]  DATE:07/06/2008                            DOB:          06/13/1946    Mr. Latona is here for followup.  He has seen Dr. Debby Bud in the past  several months and he has been stable.  He is not having any chest pain.  He is not having any significant shortness of breath.  He is exercising  regularly.  He has changed from a treadmill to an elliptical, and he is  doing well.  He does have some palpitations at times.  We know that he  will have this and overall it is not causing him any significant  problems.  He follows also with Dr. Ladona Ridgel about this, and we are all in  agreement that he is to be followed at this time.  He is on Coumadin.   PAST MEDICAL HISTORY:   ALLERGIES:  SULFA.   MEDICATIONS:  1. Vytorin 10/20.  2. Altace 2.5.  3. Nexium.  4. Coumadin.  5. Aspirin 81.  6. Multivitamin.   OTHER MEDICAL PROBLEMS:  See the list below.   REVIEW OF SYSTEMS:  He is doing well.  He has some slight problems with  tennis elbow.  His main symptom is sometimes in the middle of the night.  He asked that he could use Vytorin cream if he is having marked  symptoms.  I told him that I thought that if absolutely needed, this  could be used.  I think that the cardiovascular risk is quite small with  him on aspirin and Coumadin.  However, we agreed that at this point, he  will not use unless he is having increasing symptoms.  Otherwise, his  review of systems is negative.   PHYSICAL EXAMINATION:  VITAL SIGNS:  Blood pressure is 115/63.  Pulse is  50.  GENERAL:  The patient is oriented to person, time, and place.  Affect is  normal.  HEENT:  Reveals no xanthelasma.  He has normal extraocular motion.  NECK:  There are no carotid bruits.  There is no jugular venous  distention.  LUNGS:  Clear.  Respiratory effort is not  labored.  CARDIAC:  Reveals an S1 with an S2.  There are no clicks or significant  murmurs.  The patient does have a 2/6 systolic murmur.  ABDOMEN:  Soft.  There are no masses or bruits.  EXTREMITIES:  There is no significant peripheral edema.   PROBLEMS INCLUDE:  1. History of bradycardia that did become symptomatic while on Coreg.      He is not on Coreg now and he is stable with his heart rate.  When      he exercises, he gets a heart rate up to a 120 and as high as a 130      at times.  Further workup of his bradycardia is not needed.  2. Paroxysmal atrial fibrillation and some flutter over time.  He is  post flutter ablation.  Dr. Ladona Ridgel saw him last in May 2008.  We      will continue Coumadin and his current medicines.  3. Coronary artery disease.  He is post coronary artery bypass graft.      He had a Myoview done in January 2009.  He had no significant      symptoms, and his Myoview was normal with a high level stress.  4. A very mild carotid disease.  He had a followup Doppler in November      2008 showing only minimal disease and this can be repeated in 2      years.  5. Hyperlipidemia.  His most recent fasting lipid profile revealed an      LDL of 75.  Unfortunately, his HDL remains 29.  He did not tolerate      Niaspan in the past.  We could consider a re-trial.  I have decided      to ask for full lipid review by Shelby Dubin of the Lipid Clinic to      help with any further recommendations, and the patient is in      agreement.  6. Aortic valve sclerosis.  Echo in 2007 revealed mild aortic      insufficiency and mild aortic valve sclerosis.  He does not need an      echo at this time.  When I see him at the next visit, we will      consider an echo.  7. Scattered premature ventricular contractions that he has had in      addition to his atrial arrhythmias.  He is not bothered by this at      this time.   Mr. Ankney is stable.  He will continue his usual activity.   I will see  him back in a year.  We will make arrangements for him to have a formal  lipid evaluation in the Lipid Clinic to see if there are any other  approaches that I might take concerning his HDL.     Luis Abed, MD, [facility]  Electronically Signed    JDK/MedQ  DD: 07/06/2008  DT: 07/06/2008  Job #: [id number]   cc:   Rosalyn Gess. Norins, MD

## 2011-04-15 ENCOUNTER — Other Ambulatory Visit (INDEPENDENT_AMBULATORY_CARE_PROVIDER_SITE_OTHER): Payer: 59 | Admitting: *Deleted

## 2011-04-15 DIAGNOSIS — I4891 Unspecified atrial fibrillation: Secondary | ICD-10-CM

## 2011-04-15 LAB — BASIC METABOLIC PANEL
CO2: 28 mEq/L (ref 19–32)
Chloride: 108 mEq/L (ref 96–112)
Sodium: 141 mEq/L (ref 135–145)

## 2011-04-17 NOTE — Assessment & Plan Note (Signed)
[facility]                         GASTROENTEROLOGY OFFICE NOTE   NAME:[facility]ell [facility], Russell Griffith                       MRN:          [id number]  DATE:12/22/2006                            DOB:          [birth year]    [name] comes in and says he is doing well.  He is taking his medication on a  daily basis.  We are seeing him because he is on Coumadin and wants to  discuss his upcoming colonoscopic examination.  He has also known  Barrett's esophagus which we have kept a careful watch on.  His last  biopsy showed interstitial metaplasia consistent with Barrett's with no  dysplasia.  His last upper endoscopy was in June of 2007.  His last  colonoscopic examination was 2003.  He had colon polyps at his last  colonoscopy.  He is due for followup.   PAST MEDICAL HISTORY:  Noncontributory.  Except for a history or  arteriosclerotic vascular disease that required a bypass.  He does see  Dr. Myrtis Ser for his cardiac problems.   REVIEW OF SYSTEMS:  Noncontributory.   SOCIAL HISTORY:  Noncontributory.   PHYSICAL EXAMINATION:  A healthy-appearing gentleman in no acute  distress.  VITAL SIGNS:  Normal.  NECK:  unremarkable  HEART:  unremarkable  EXTREMITIES: unremarkable.   IMPRESSION:  1. History of colon polyps.  2. Gastroesophageal reflux disease with Barrett's.  3. Arteriosclerotic coronary vascular disease status post bypass, on      Coumadin with atrial fibrillation.   RECOMMENDATIONS:  Continue his medications which include Altace,  Vytorin, Nexium, Coumadin, and baby aspirin along with multivitamin.  Takes the Nexium 40 mg daily.  The recommendation is to schedule him for  colonoscopic examination at his convenience in the near future.  The  Coumadin should be managed as per Dr. Myrtis Ser.     Ulyess Mort, MD  Electronically Signed    SML/MedQ  DD: 12/22/2006  DT: 12/22/2006  Job #: [id number]

## 2011-04-17 NOTE — Assessment & Plan Note (Signed)
[facility]                         ELECTROPHYSIOLOGY OFFICE NOTE   [facility], Russell [facility]                       MRN:          [id number]  DATE:12/31/2006                            DOB:          [birth year]    [facility] comes in today.  He was running and, late last week, early  this week, noticed some discomfort in the back of his right leg in the  lower part of his hamstring.  It seemed to get better.  He tried to run  on it the other day and had to stop because of severe pain.  He comes in  today.  His INR today is 2.2.  On examination of his lower thigh, there  is swelling in the posterior aspect, just above the popliteal fossa.  There is no ecchymosis.  There is some mild tenderness.   I spoke with Dr. Artist Pais.  We will plan to obtain an ultrasound, and then he  will follow up with Dr. Artist Pais or Dr. Debby Bud early next week, the current  differential being, in my mind, a Baker cyst or a hematoma in the  hamstring.  I have advised him to refrain from running until he is seen  next week.     Duke Salvia, MD, [facility]  Electronically Signed    SCK/MedQ  DD: 12/31/2006  DT: 12/31/2006  Job #: [id number]   cc:   Rosalyn Gess. Norins, MD  Luis Abed, MD, [facility]

## 2011-04-17 NOTE — Cardiovascular Report (Signed)
[facility]  Patient:    Russell Griffith                        MRN: [id number] Proc. Date: 10/03/99 Adm. Date:  [id number] Attending:  Mikey Bussing CC:         Rosalyn Gess. Norins, M.D. [facility]             Thomas D. Riley Kill, M.D. [facility]             Cardiac Catheterization Laboratory                        Cardiac Catheterization  PROCEDURE:  Left heart catheterization, coronary angiography, left ventriculography.  INDICATIONS:  Mr. Bontrager is a 65 year old male with recent onset of progressive exertional angina.  A stress Cardiolite in the office showed significant perfusion defect.  He is referred for urgent cardiac catheterization.  DESCRIPTION OF PROCEDURE:  A 6 French sheath was placed in the right femoral artery.  Standard Judkins 6 French catheters were utilized.  Contrast was Omnipaque.  A Perclose vascular closure device was placed in the right femoral artery with good hemostasis.  There were no complications.  RESULTS:  HEMODYNAMICS:  Left ventricular pressure 150/18.  Aortic pressure 124/68. There was no aortic valve gradient.  LEFT VENTRICULOGRAM:  Wall motion is normal.  Ejection fraction is greater than  60%.  CORONARY ARTERIOGRAPHY:  Left dominant system.  Left main:  Left main has a distal 40% stenosis.  Left anterior descending:  The LAD has a complex 95% stenosis in the proximal vessel with a bifurcation of a normal sized first diagonal.  Following this 95%  stenosis is a tubular 50% stenosis.  The mid vessel has a 40% stenosis extending across a second diagonal branch and further down in the mid vessel is a 70% stenosis.  The first diagonal is normal sized with an 80% stenosis at its origin. The second diagonal is also normal in size.  The left circumflex is a dominant vessel.  It has a ostial 30% stenosis and a mid tubular 50% stenosis.  There is a small ramus intermedius, a normal branching obtuse marginal  branch.  The first posterolateral branch is a fairly long but slender branch, which has a 95% stenosis followed by a 70% stenosis.  There is  normal size second posterolateral branch, a normal size third posterolateral branch.  Right coronary artery:  The right coronary artery is a nondominant vessel. There is a 95% stenosis in the proximal vessel and a 70% stenosis in the mid vessel.  IMPRESSION: 1. Normal left ventricular systolic function. 2. Three-vessel coronary artery disease as described with the most significant    being the complex bifurcation of proximal left anterior descending stenosis.   PLAN:  Cardiovascular Surgery will be consulted for evaluation for bypass surgery. DD:  10/03/99 TD:  10/06/99 Job: [id number]

## 2011-04-17 NOTE — Op Note (Signed)
. [facility]  Patient:    Russell Griffith                        MRN: [id number] Proc. Date: 10/06/99 Adm. Date:  [id number] Attending:  Mikey Bussing CC:         CVTS             [facility] Office                           Operative Report  OPERATION PERFORMED:  Coronary artery bypass grafting x 5 (left internal mammary artery to LAD, saphenous vein graft to diagonal-1, saphenous vein graft to diagonal-2, saphenous vein graft to circumflex marginal-2, and saphenous vein graft to RV marginal).  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  POSTOPERATIVE DIAGNOSIS:  Class IV unstable angina with severe three-vessel coronary disease.  SURGEON:  Mikey Bussing, M.D.  ASSISTANTS: 1. Gwenith Daily. Tyrone Sage, M.D. 2. Sherrie George, P.A.  ANESTHESIA:  General.  INDICATIONS:  The patient is a 65 year old male with a strong family history of  coronary disease who presented with progressive symptoms of dyspnea on exertion and a strongly positive stress test.  He underwent a recent cardiac catheterization  which demonstrated a proximal 90% LAD stenosis, 95% stenosis of a non-dominant right, and 70% stenosis of a distal circumflex marginal.  He was referred for coronary revascularization.  Prior to the operation, the patient was examined in his hospital room, and the results of his cardiac catheterization were discussed with the patient and wife.  The indications and expected benefits of coronary bypass grafting were reviewed.  I discussed the details of the operation, including the placement of the surgical incisions, the use of cardiopulmonary bypass and general anesthesia, and the expected postoperative recovery.  I discussed the potential associated risks of surgery including the risks of MI, CV, bleeding, infection, and death with the patient and wife.  He understood  these implications for surgery and agreed to proceed with the operation as planned under informed consent.  DESCRIPTION OF PROCEDURE:  The patient was brought to the operating room and placed supine on the operating table where general anesthesia was induced under invasive hemodynamic monitoring.  The chest, abdomen, and legs were prepped with Betadine and draped as a sterile field.  A median sternotomy was performed, and the saphenous vein was harvested from the right lower extremity.  The left internal  mammary artery was harvested as a pedicle graft from its origin at the subclavian vessels and was a good vessel with excellent flow.  Heparin was administered systemically, and the sternal retractor was placed.  The pericardium was opened  after purse-strings placed in the ascending aorta and right atrium.  The patient was cannulated, placed on cardiopulmonary bypass, and cooled to 32 degrees. The ACT was documented as being therapeutic prior to going on bypass.  The coronaries were inspected, and the LAD, diagonal-1, and diagonal-2 were good targets for grafting.  The OM-2 was small on the posterolateral wall of the left ventricle ut was an adequate vessel.  The right coronary was non-dominant, and the main body was too small to graft, but the RV marginal was adequate for grafting being 1 mm. he mammary artery and vein grafts were prepared for the distal anastomoses, and a cardioplegia cannula was placed.  The patient was cooled to  28 degrees, and his  aortic Cross clamp was applied.  Then, 700 cc of cold blood cardioplegia were delivered to the aortic root with immediate cardioplegic arrest and septal temperature dropping to less than 12 degrees.  Topical iced saline slush was used to augment myocardial preservation, and a pericardial insulator pad was used to  protect the left phrenic nerve.  The distal coronary anastomoses were then performed.  The first  distal anastomosis was to the first diagonal.  This was a 1.8 mm vessel with proximal 90% stenosis.  A reverse saphenous vein was sewn end-to-side with running 7-0 Prolene and there as good flow through the graft.  A second distal anastomosis was to the diagonal-2. This was a 1.5 mm vessel with proximal 70% stenosis.  A reverse saphenous vein as sewn end-to-side with running 7-0 Prolene.  A third distal anastomosis was to the RV marginal which was a 1.2 mm vessel with a proximal 95% stenosis.  A reverse saphenous vein was sewn end-to-side with running 7-0 Prolene.  There was good flow through the graft.  A fourth distal anastomosis was to the OM-2 at its distal aspect on the posterolateral aspect of the LV.  This was a 1.4 mm vessel with proximal 90% stenosis, and a reverse saphenous vein was sewn end-to-side with running 7-0 Prolene.  A fifth distal anastomosis was to the mid LAD which was a  2.0 mm vessel with proximal 90% stenosis.  The left internal mammary artery pedicle was brought through an opening created in the left lateral pericardium, was brought down on the LAD, and sewn end-to-side with running 8-0 Prolene.  There was excellent flow through the anastomosis with immediate rise in septal temperature after release of the pedicle clamp on the mammary artery.  The mammary pedicle as secured to the epicardium, and the aortic Cross clamp was removed.  The heart was cardioverted back to a regular rhythm.  The patient was rewarmed nd re-perfused, and temporary pacing wires were applied.  The patient reached 37 degrees, and the lungs were re-expanded, and the ventilator was turned on.  The  patient was weaned from cardiopulmonary bypass without inotropes and without needed blood products.  Protamine was administered, and the cannulas were removed. The mediastinum was irrigated with warm, antibiotic irrigation.  The leg incision was close in a standard fashion.  The  pericardium was closed superiorly over the aorta, vein grafts, and right ventricle.  Two mediastinal and a left pleural chest tube were placed, and the sternum was closed in layers.  Total cardiopulmonary bypass  time was 140 minutes with an aortic Cross clamp of 70 minutes. DD:  10/07/99 TD:  10/09/99 Job: [id number]

## 2011-04-17 NOTE — Discharge Summary (Signed)
NAME:  Russell Griffith, BOY NO.:  [id number]   MEDICAL RECORD NO.:  [id number]                   PATIENT TYPE:  OIB   LOCATION:  2012                                 FACILITY:  [facility]   PHYSICIAN:  Doylene Canning. Ladona Ridgel, M.D. [facility]           DATE OF BIRTH:  [birth year]   DATE OF ADMISSION:  08/25/2002  DATE OF DISCHARGE:  08/26/2002                           DISCHARGE SUMMARY - REFERRING   PROCEDURES:  1. Radiofrequency catheter ablation of atrial flutter.  2. Transesophageal echocardiogram.   HISTORY OF PRESENT ILLNESS:  The patient is a 65 year old male with known  coronary artery disease who had bypass surgery in November 2000.  He is  followed by Dr. Myrtis Ser in our office.  He was seen in the office and evaluated  by Dr. Myrtis Ser and referred to Dr. Ladona Ridgel for evaluation of atrial flutter.  It  was felt that radiofrequency catheter ablation of atrial flutter was the  best option to enable him to restore and continue sinus rhythm.   HOSPITAL COURSE:  The patient was admitted on August 25, 2002, and had a  TEE.  It showed left atrial and left atrial appendage in right atrium  without masses or thrombus and normal LV function.  There was no PFO by  color Doppler and the aortic valve was mildly thickened with mild AI and  there was mild MR as well.  After that, he had the ablation.   Radiofrequency catheter ablation was performed on typical atrial flutter  with a cycle of 220 msec.  Normal sinus rhythm was restored.  He had two  radiofrequencies and was observed 45 minutes without isthmus conduction.  He  was seen postprocedure by Dr. Eden Emms and the next day by Dr. Antoine Poche.  There were no complications from the procedure.  He was maintaining sinus  rhythm.  He had no problems with the sites.  His INR was 1.6 and he was  given Lovenox.  It was felt that he could be discharged with outpatient  followup arranged.   LABORATORY DATA AND X-RAY FINDINGS:  INR 1.6 at  discharge.   CONDITION ON DISCHARGE:  Improved.   DISCHARGE DIAGNOSES:  1. Atrial flutter, status post radiofrequency catheter ablation this     admission.  2. Status post aortic coronary bypass surgery in November 2000, with left     internal mammary artery to the left anterior descending, saphenous vein     graft to diagonal-1, saphenous vein graft to diagonal-2, saphenous vein     graft to circumflex marginal-2 and saphenous vein graft to right     ventricle marginal.  3. Hypertension.  4. Preserved left ventricular function.  5. Family history of premature coronary artery disease.  6. Hyperlipidemia.  7. History of premature ventricular contractions.  8. Soft carotid bruit with normal carotid Dopplers in 2002.  9. 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  10.      History of sudden reflux symptoms.   ACTIVITY:  His activity level is to be reduced until he sees Dr. Myrtis Ser.  He  needs to do no driving or strenuous activity x2 days.   DIET:  Low fat diet.   SPECIAL INSTRUCTIONS:  He is to call for problems with the incisions.   FOLLOW UP:  He is to follow up at the Coumadin clinic and they will call.  He is to follow up with Dr. Debby Bud as needed.  He is to follow up with Dr.  Myrtis Ser or Dian Queen, P.A. and the office will call.   DISCHARGE MEDICATIONS:  1. Coumadin 5 mg tablets 1-1/2 tablets today, one tablet tomorrow and one     tablet on Monday.  2. Zocor 20 mg q.d.  3. Altace 2.5 mg q.d.  4. Nexium 40 mg q.d.  5. Multivitamin q.d.  6. Coated aspirin 81 mg q.d.  7. He is not to take Tiazac.     Lavella Hammock, P.A. [facility]                  Doylene Canning. Ladona Ridgel, M.D. [facility]    RG/MEDQ  D:  08/26/2002  T:  08/29/2002  Job:  [id number]   cc:   Rosalyn Gess. Norins, M.D. [facility]   Luis Abed, M.D. [facility]

## 2011-04-17 NOTE — Assessment & Plan Note (Signed)
[facility]                            CARDIOLOGY OFFICE NOTE   Russell Griffith, Russell Griffith                       MRN:          [id number]  DATE:03/21/2007                            DOB:          1946/10/10    Mr. Karapetian is here for cardiology followup.  He has known coronary  disease, and he has atrial fibrillation and atrial flutter.  The flutter  has been ablated in the past.  For several months, he has noted some  palpitations.  He is assuming it is atrial fibrillation.  Dr. Ladona Ridgel had  seen him back in September 2006, and at that time Coumadin was started.  It was felt that his palpitations did not appear to bother him that much  and could be watched clinically.  He has remained very active.  With his  exercise program, he does not appear to have any significant problems.  He does feel fatigued after exercise.  He has noted there are times when  he senses and irregular heart beat, and then he might feel an unusual  sensation that is limited to his upper chest.  He has had some mild  dizziness.  He has not had any syncope or presyncope.   PAST MEDICAL HISTORY:   ALLERGIES:  SULFA.   MEDICATIONS:  1. Vytorin 10/20.  2. Altace 2.5.  3. Nexium.  4. Coumadin.  5. Baby aspirin.  6. Multivitamins.   OTHER MEDICAL PROBLEMS.:  See the list below.   REVIEW OF SYSTEMS:  As mentioned, he has some fatigue and dizziness.  He  has had some slight shortness of breath.  Otherwise, his Review of  Systems is negative.   PHYSICAL EXAMINATION:  VITAL SIGNS: Weight 195 pounds.  Blood pressure  114/69 with a pulse of 49.  GENERAL:  The patient is oriented to person, time, and place.  Affect is  normal.  He is here today with his wife.  HEENT:  Reveals no xanthelasma.  He has normal extraocular motion.  NECK:  There are carotid bruits.  There is no jugular venous distention.  LUNGS: Clear.  Respiratory effort is not labored.  CARDIAC:  S1 and S2.  There is a  2/6 soft systolic murmur.  ABDOMEN:  Soft.  There are no masses or bruits.  EXTREMITIES:  He has no significant peripheral edema.   EKG today reveals sinus bradycardia.   Most recent labs in February 2008 revealed a hemoglobin of 15.8;  creatinine 1.0; and his HDL was 34 with an LDL of 72 and triglycerides  of 51.   PROBLEM LIST:  1. History of symptomatic bradycardia while on Coreg in the past.  We      have to be very careful with his heart rate.  2. Paroxysmal atrial fibrillation.  In the past, he did not appear to      be bothered by this significantly.  He is on Coumadin.  Recently he      has had more symptoms.  We do not know yet if it is atrial      fibrillation or not.  3. History of atrial flutter status post ablation by Dr. Ladona Ridgel.  4. 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      He had a stress Myoview scan done in June 2007.  He had no      significant EKG changes.  There were occasional PVCs in couplets.      There was scar or ischemia.  5. History of carotid bruits.  He has had only minimal disease in the      past.  In April 2006 he had 0-39% stenoses.  We will plan to repeat      this in the future.  6. Hyperlipidemia.  The patient has gotten a very good response from      Vytorin 10/20.  He and his wife asked me about recent literature on      Vytorin, and I made it clear to him that there was no absolute      indication to stop his Vytorin.  This could be considered if he      would like.  He prefers to remain on it.  We will reconsider this      regularly.  His HDL has come up from 24-37.  We had tried Niaspan      in the past, and he did not tolerate it well.  We will consider      another trial in the future with other potential preparations.  7. Aortic valve sclerosis.  Echocardiogram done in 2007 revealed mild      aortic insufficiency and mild aortic valve sclerosis.  8. Question of some leg swelling, and he had venous Dopplers  done      January 03, 2007.  There was no evidence of right lower extremity      DVT.  9. Recent feeling of some fatigue after he has finished his exercise      program.  At this point, this is not specific.  10.Mild discomfort and possibly slight dizziness with feeling some      palpitations.  He has not had syncope or presyncope.  I believe      that he is sensing his irregular beats; the question is which beats      he senses.  I explained to him how he might get a post PVC or PAC      accentuated beat with a thumping sensation, and he thought this      might be what is bothering him.  We will have him wear an event      recorder to see if we can capture what the rhythm actually is. We      will then consider asking Dr. Ladona Ridgel to see him in followup.      Tikosyn has been considered in the past only if he had a lot of      symptoms.  Overall, I believe Mr. Taddei is doing well.  I know      that at times he is bothered by arrhythmias, and this can affect      him in many ways.  We will follow this along carefully and be sure      that he is stable.  11.History of some prostatitis that has been treated by Dr. Debby Bud.  12.History of gastroesophageal reflux disease with surveillance for      Barrett's esophagus.   After the event recorder, we will be back in touch with him.     Luis Abed,  MD, [facility]  Electronically Signed    JDK/MedQ  DD: 03/21/2007  DT: 03/21/2007  Job #: [id number]   cc:   Doylene Canning. Ladona Ridgel, MD  Rosalyn Gess Norins, MD

## 2011-04-17 NOTE — Assessment & Plan Note (Signed)
[facility]                           PRIMARY CARE OFFICE NOTE   Russell Griffith, Russell Griffith                       MRN:          [id number]  DATE:01/10/2007                            DOB:          1946-01-20    Russell Griffith is a 65 year old gentleman with a complex medical history,  who presents for followup evaluation and wellness exam.  Patient's last  primary care physical exam was December 21, 2005.  Please see that  complete note for past medical history, surgical history, family  history, and social history.  Also, there is a summary of his chart up  to that date listing all of his procedures.  In the interval since that  visit, the patient has been seen by gastroenterology for atypical chest  discomfort, thought to be noncardiac in nature, and the patient did come  to EGD and surveillance for Barrett's.  He was found to have Barrett's  esophagus at 4 cm of length, from the LES up.  He also had hiatal  hernia.  There was mild stricture, but patient had no dysphagia,  therefore dilatation was not required.   Patient was seen in cardiology Apr 16, 2006 for followup.  Please see  Dr. Myrtis Ser' full exam.  He does have mild AI and mild sclerosis.  Patient's last chest Myoview was 2005, and was within normal limits.   Patient had a followup in GI, December 22, 2006.  He was to be scheduled  for a full colonoscopy which is pending at this time.   From a general medical perspective, patient was seen for a sore throat  and ear pain, June 05, 2006, recurrent prostatitis, November 25, 2006,  bulbar conjunctival hemorrhage on December 16, 2006, and again,  persistent recurrent prostatitis treated with another round of Cipro.   Most recently, this patient had significant pain in his right leg  proximally after running.  Dr. Berton Mount did order lower extremity  venous Doppler, which by report was negative for DVT, baker cyst or  other injury.   CURRENT  MEDICATIONS:  1. Vytorin 10/20 once daily.  2. Altace 2.5 mg daily.  3. Nexium 40 mg daily.  4. Coumadin as directed.  5. Aspirin 81 mg daily.   REVIEW OF SYSTEMS:  Patient has had no constitutional, cardiovascular,  respiratory, GI, GU complaints or problems.  He has not had recurrent  prostatitis since the last round of antibiotics.  MUSCULOSKELETAL:  As  above with some ongoing leg discomfort, which has improved as the  patient limited his physical activities while healing.   PHYSICAL EXAMINATION:  Temperature was 98, blood pressure 114/67, pulse  52, weight 196.  GENERAL APPEARANCE:  Well-nourished, well-developed, athletic-appearing  gentleman in no acute distress.  HEENT:  Normocephalic, atraumatic.  EACs and TMs were normal.  Oropharynx with native dentition in good repair, no buccal or palatal  lesions were noted.  Posterior pharynx was clear.  Conjunctivae and  sclerae were clear.  PERRLA, EOMI.  Funduscopic exam was unremarkable  with a handheld instrument.  NECK:  Supple without thyromegaly.  No lymphadenopathy was  noted in  cervical or supraclavicular regions.  CHEST:  No CVA tenderness.  LUNGS:  Clear to auscultation and percussion.  CARDIOVASCULAR:  2+ radial pulse, no JVD, no carotid bruits.  He has a  well-healed sternotomy scar.  His precordium was quiet.  He had a  regular rate and rhythm with a 2/6 systolic murmur heard best at the  right sternal border, although there is a soft murmur at the left  sternal border.  No diastolic component was noted.  There was no heave,  no thrill.  ABDOMEN:  Soft, no guarding, no rebound, no organosplenomegaly was  appreciated.  GENITALIA:  Normal bilaterally, descended testicles without masses.  RECTAL EXAM:  Normal.  Sphincter tone was noted.  Prostate was flat,  smooth, with no nodules or abnormalities.  EXTREMITIES:  Without cyanosis, clubbing, or edema.  No deformities were  actually noted.  NEUROLOGIC EXAM:  Grossly  nonfocal.  SKIN:  Clear with no visible lesions.   DATABASE:  Hemoglobin 15.8 g, white count was 6,100 with a normal  differential.  Chemistries with a glucose of 107, electrolytes were  unremarkable.  Kidney function normal with a creatinine of 1.0 and a GFR  of 82 ml per minute.  Liver functions were normal with a persistently  elevated total bilirubin at 1.4.  Cholesterol is 117, triglycerides 51,  HDL 34.5, LDL was 72.  Thyroid function normal with a TSH of 3.40, PSA  was normal at 0.81.  Urinalysis was negative.   ASSESSMENT/PLAN:  1. Cardiovascular.  Patient is stable, doing well.  He is followed on      a regular basis by Dr. Myrtis Ser in the coag clinic.  2. Lipids.  Patient's lipids were very well controlled and at goal.      HDL slightly low, but his LDL compensates and would not add      additional medication at this time.  3. Genitourinary.  The patient with 2 episodes of prostatitis      recently.  This is a chronic and recurrent problem for the patient.      He is currently stable.  Genitourinary exam was otherwise normal.  4. Gastrointestinal.  Patient with history of gastroesophageal reflux      disease and reflux and Barrett's esophagus.  He has had recent      surveillance, upper endoscopy, and is currently doing well.  He      will follow up as instructed by gastrointestinal.  Patient is also      due for colonoscopy, he can schedule it at his convenience.  5. Health maintenance.  Patient with a normal PSA and normal prostate      exam.  Patient does exercise on a regular basis.  Patient's serum      glucose is within normal limits and stable.  However, he has a      strong family history for diabetes with his father recently having      passed from diabetic complications.  We discussed recent American      Diabetes Association recommendations, as well as endocrinology      recommendations.  PLAN:  Patient is to return for a 2-hour glucose tolerance test to see  if he  is pre diabetic or hyperglycemic.  He is instructed to follow a  limited concentrated sweets diet, and a low-carbohydrate diet.  Patient  will be notified by phone of the results of the glucose tolerance test.   In summary, patient with multiple medical  problems, well documented in  previous notes, who, at this time, seems medically stable.  He has asked  to return to see me on a p.r.n. basis.     Rosalyn Gess Norins, MD  Electronically Signed    MEN/MedQ  DD: 01/11/2007  DT: 01/11/2007  Job #: [id number]   cc:   Mr. Mckinley Jewel  Luis Abed, MD, [facility]

## 2011-04-24 ENCOUNTER — Encounter: Payer: Self-pay | Admitting: Cardiology

## 2011-04-29 ENCOUNTER — Other Ambulatory Visit: Payer: Self-pay | Admitting: *Deleted

## 2011-04-29 MED ORDER — WARFARIN SODIUM 5 MG PO TABS: 5.0000 mg | ORAL_TABLET | ORAL | Status: DC

## 2011-04-30 ENCOUNTER — Encounter: Payer: Self-pay | Admitting: Cardiology

## 2011-04-30 DIAGNOSIS — Z7901 Long term (current) use of anticoagulants: Secondary | ICD-10-CM | POA: Insufficient documentation

## 2011-04-30 DIAGNOSIS — I351 Nonrheumatic aortic (valve) insufficiency: Secondary | ICD-10-CM | POA: Insufficient documentation

## 2011-04-30 DIAGNOSIS — I370 Nonrheumatic pulmonary valve stenosis: Secondary | ICD-10-CM | POA: Insufficient documentation

## 2011-05-01 ENCOUNTER — Other Ambulatory Visit: Payer: Self-pay | Admitting: *Deleted

## 2011-05-01 MED ORDER — RAMIPRIL 2.5 MG PO CAPS: 2.5000 mg | ORAL_CAPSULE | Freq: Every day | ORAL | Status: DC

## 2011-05-04 ENCOUNTER — Encounter: Payer: Self-pay | Admitting: Cardiology

## 2011-05-04 ENCOUNTER — Ambulatory Visit (INDEPENDENT_AMBULATORY_CARE_PROVIDER_SITE_OTHER): Payer: 59 | Admitting: *Deleted

## 2011-05-04 ENCOUNTER — Ambulatory Visit (INDEPENDENT_AMBULATORY_CARE_PROVIDER_SITE_OTHER): Payer: 59 | Admitting: Cardiology

## 2011-05-04 VITALS — BP 120/60 | HR 44 | Ht 71.0 in | Wt 197.0 lb

## 2011-05-04 DIAGNOSIS — E876 Hypokalemia: Secondary | ICD-10-CM

## 2011-05-04 DIAGNOSIS — Z7901 Long term (current) use of anticoagulants: Secondary | ICD-10-CM

## 2011-05-04 DIAGNOSIS — R001 Bradycardia, unspecified: Secondary | ICD-10-CM

## 2011-05-04 DIAGNOSIS — I4891 Unspecified atrial fibrillation: Secondary | ICD-10-CM

## 2011-05-04 DIAGNOSIS — I498 Other specified cardiac arrhythmias: Secondary | ICD-10-CM

## 2011-05-04 LAB — POCT INR: INR: 3.8

## 2011-05-04 NOTE — Assessment & Plan Note (Signed)
Atrial fibrillation is being controlled with Tikosyn.  No change in therapy.

## 2011-05-04 NOTE — Assessment & Plan Note (Signed)
Patient has marked resting bradycardia.  He has no symptoms.  His heart rate increases with exercise.  No change in therapy.

## 2011-05-04 NOTE — Progress Notes (Signed)
HPI Patient is seen for followup of atrial fibrillation.  He is doing very well.  He is maintaining sinus rhythm.  We have him on 375 micrograms of Tikosyn b.i.d.  He is exercising well and does not feel palpitations.  He may have some rare palpitations during the day.  He has marked sinus bradycardia at rest but has no symptoms.  His heart rate increases to the range of 120 at its peak exercise.  We've been watching his potassium very carefully.  He is taking 20 mEq of potassium a day.  He would like to cut it to 10 mEq.  We will have to check chemistry to level today first.  Also I will check a magnesium if I do not have a recent one. Allergies  Allergen Reactions  . Sulfonamide Derivatives     Current Outpatient Prescriptions  Medication Sig Dispense Refill  . aspirin 81 MG tablet Take 81 mg by mouth daily.        Marland Hara atorvastatin (LIPITOR) 20 MG tablet Take 1 tablet (20 mg total) by mouth daily.  30 tablet  12  . dofetilide (TIKOSYN) 250 MCG capsule Take 250 mcg and 125 mcg  Tablet  twice a day       . Multiple Vitamin (MULTIVITAMIN) capsule Take 1 capsule by mouth daily.        Marland Arko NEXIUM 40 MG capsule TAKE (1) CAPSULE DAILY.  30 each  6  . potassium chloride SA (K-DUR,KLOR-CON) 20 MEQ tablet Take 1 tablet (20 mEq total) by mouth daily.  30 tablet  11  . ramipril (ALTACE) 2.5 MG capsule Take 1 capsule (2.5 mg total) by mouth daily.  30 capsule  11  . valACYclovir (VALTREX) 1000 MG tablet Take 1,000 mg by mouth 2 (two) times daily as needed.       . warfarin (COUMADIN) 5 MG tablet Take 1 tablet (5 mg total) by mouth as directed.  35 tablet  3  . DISCONTD: dofetilide (TIKOSYN) 250 MCG capsule Take 1 capsule (250 mcg total) by mouth 2 (two) times daily.  60 capsule  3  . azithromycin (ZITHROMAX) 250 MG tablet Take 2 tablets by mouth on day 1, followed by 1 tablet by mouth daily for 4 days.          History   Social History  . Marital Status: Married    Spouse Name: N/A    Number of  Children: 2  . Years of Education: 18   Occupational History  . [employer]   Social History Main Topics  . Smoking status: Never Smoker   . Smokeless tobacco: Never Used  . Alcohol Use: Yes     2 glasses of wine per day  . Drug Use: No  . Sexually Active: Yes -- Male partner(s)   Other Topics Concern  . Not on file   Social History Narrative   [employer] [place].  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  2 daughters-'75, '79; 1 grandchild; step-daughter and step grandson.  Regular exercise-yes, runs 1.5-2 mi 4x/wk, also elipticalPatient signed a Designated Party Release to allow his wife, Nolen Lindamood, to have access to his medical records/ information.    Family History  Problem Relation Age of Onset  . Hyperlipidemia    . Hypertension    . Coronary artery disease Other 72  . Diabetes Other     Past Medical History  Diagnosis Date  .  CAD (coronary artery disease) of bypass graft     Myoview March, 2011, excellent exercise, hypertensive response, no scar or ischemia, EF 61%, mild palpitations peak stress with infrequent PACs and PVCs.  Marland Koon HTN (hypertension)     Controlled at rest, hypertension on treadmill, sensitive to medications  . Hyperlipidemia     Low HDL  . GERD (gastroesophageal reflux disease)     Barrett's esophagus  . Colonic polyp   . A-fib      Admission for Tikosyn load, March 20, 2011, converted after first dose, QTC 440 at the time of discharge. / Office note March 27, 2011, clinically in and out of atrial, QTC 500 ms, Dr.Klein decreased Tikosyn to 375mg  BID.  Atrial fib at rest February 25, 2011 / plan to admit for [facility] therapy  . Palpitations      exercise palpitations, probably A. fib-not VT,Pindolol not tolerated, CAD, bradycardia,Tickosyn needs hospital +/_ side effects, amiodarone & sotalol could need pacer,Multaq less effective not tried, atrial fib ablation  could be considered at low risk  . Foot pain     Chronic lateral foot pain-resolved with use of orthotics '09  . Bradycardia   . Aortic stenosis     Mild, echo, April, 2012  . PVC's (premature ventricular contractions)   . Carotid artery disease     Doppler November, 2010, 0-39% bilateral followup 2 years  . Atrial flutter     Status post ablation  . Warfarin anticoagulation     Coumadin for atrial fib  . Aortic insufficiency      Mild, echo, 2010 / mild/moderate, echo, 2012  . Drug intolerance     Niaspan  . Elevated bilirubin     Mild chronic elevation, 2.0 January, 2011 stable  . BPH (benign prostatic hyperplasia)   . Drug intolerance     Pindolol before exercise not tolerated  . Ejection fraction     EF 60-65%, echo, September, 2010 / EF 60-65% echo, April, 2012  . Hypokalemia     Potassium 3.6 before potassium started April, 2012  . Hx of colonoscopy   . Primary osteoarthritis of left knee     Mild  . Chronic foot pain 2009    Resolved with use of orthotics  . Intolerance of drug     Fish oil, bilirubin  . BPH (benign prostatic hyperplasia)   . TSH (thyroid-stimulating hormone deficiency)   . Pulmonic stenosis     Mild, echo, April, 2012    Past Surgical History  Procedure Date  . Coronary artery bypass graft 2000  . Tonsillectomy     ROS  Patient denies fever, chills, headache, sweats, rash, change in vision, change in hearing, chest pain, cough, nausea vomiting, urinary symptoms.  All other systems are reviewed and are negative.  PHYSICAL EXAM Patient looks great.  He is oriented to person time and place.  Affect is normal.  There is no xanthelasma.  Lungs are clear.  Respiratory effort is unlabored.  Cardiac exam reveals an S1 and S2.  There are no clicks or significant murmurs.  The abdomen is soft.  There is no peripheral edema. Filed Vitals:   05/04/11 1539  BP: 120/60  Pulse: 44  Height: 5\' 11"  (1.803 m)  Weight: 197 lb (89.359 kg)    EKG Is done  today.  There is sinus bradycardia that is marked.  Corrected QT interval is 433 ms.  ASSESSMENT & PLAN

## 2011-05-04 NOTE — Assessment & Plan Note (Signed)
Potassium and magnesium are to be checked today.  It is important to keep his potassium 4.0 or higher.

## 2011-05-04 NOTE — Patient Instructions (Signed)
Lab today (bmet) Your physician wants you to follow-up in: 3 months.  You will receive a reminder letter in the mail two months in advance. If you don't receive a letter, please call our office to schedule the follow-up appointment.

## 2011-05-05 LAB — BASIC METABOLIC PANEL
BUN: 26 mg/dL — ABNORMAL HIGH (ref 6–23)
Chloride: 107 mEq/L (ref 96–112)
Potassium: 3.9 mEq/L (ref 3.5–5.1)
Sodium: 139 mEq/L (ref 135–145)

## 2011-05-05 LAB — MAGNESIUM: Magnesium: 2 mg/dL (ref 1.5–2.5)

## 2011-05-18 ENCOUNTER — Ambulatory Visit (INDEPENDENT_AMBULATORY_CARE_PROVIDER_SITE_OTHER): Payer: 59 | Admitting: *Deleted

## 2011-05-18 DIAGNOSIS — I4891 Unspecified atrial fibrillation: Secondary | ICD-10-CM

## 2011-06-15 ENCOUNTER — Ambulatory Visit (INDEPENDENT_AMBULATORY_CARE_PROVIDER_SITE_OTHER): Payer: 59 | Admitting: *Deleted

## 2011-06-15 DIAGNOSIS — I4891 Unspecified atrial fibrillation: Secondary | ICD-10-CM

## 2011-06-15 DIAGNOSIS — Z7901 Long term (current) use of anticoagulants: Secondary | ICD-10-CM

## 2011-07-13 ENCOUNTER — Ambulatory Visit (INDEPENDENT_AMBULATORY_CARE_PROVIDER_SITE_OTHER): Payer: 59 | Admitting: *Deleted

## 2011-07-13 DIAGNOSIS — Z7901 Long term (current) use of anticoagulants: Secondary | ICD-10-CM

## 2011-07-13 DIAGNOSIS — I4891 Unspecified atrial fibrillation: Secondary | ICD-10-CM

## 2011-07-13 LAB — POCT INR: INR: 2.4

## 2011-07-22 ENCOUNTER — Other Ambulatory Visit: Payer: Self-pay | Admitting: Internal Medicine

## 2011-07-22 NOTE — Telephone Encounter (Signed)
Pharmacy received a refill for tikosyn 250 mg and requested also for 125 mg the fax came back saying he's not our pt, pt takes both strengths and needs refill of the 125mg  as well

## 2011-07-24 ENCOUNTER — Other Ambulatory Visit: Payer: Self-pay | Admitting: *Deleted

## 2011-07-24 MED ORDER — DOFETILIDE 125 MCG PO CAPS: 125.0000 ug | ORAL_CAPSULE | Freq: Two times a day (BID) | ORAL | Status: DC

## 2011-08-06 ENCOUNTER — Telehealth: Payer: Self-pay | Admitting: Cardiology

## 2011-08-06 NOTE — Telephone Encounter (Signed)
Has a question about OTC medication he needs to take for his sinus drainage.  Pl call him at [phone] leave message if no answer.  Is there anything that will affect his PT.

## 2011-08-07 NOTE — Telephone Encounter (Signed)
Telephoned pt and LMOM to inform him that OTC for sinus drainage will not affect his INR, but if he wants to call back with specific name or any other questions to do so.

## 2011-08-10 ENCOUNTER — Ambulatory Visit (INDEPENDENT_AMBULATORY_CARE_PROVIDER_SITE_OTHER): Payer: 59 | Admitting: *Deleted

## 2011-08-10 DIAGNOSIS — Z7901 Long term (current) use of anticoagulants: Secondary | ICD-10-CM

## 2011-08-10 DIAGNOSIS — I4891 Unspecified atrial fibrillation: Secondary | ICD-10-CM

## 2011-08-10 LAB — POCT INR: INR: 2.2

## 2011-08-21 ENCOUNTER — Encounter: Payer: Self-pay | Admitting: Cardiology

## 2011-08-21 DIAGNOSIS — E785 Hyperlipidemia, unspecified: Secondary | ICD-10-CM | POA: Insufficient documentation

## 2011-08-21 DIAGNOSIS — Z79899 Other long term (current) drug therapy: Secondary | ICD-10-CM | POA: Insufficient documentation

## 2011-08-21 DIAGNOSIS — I10 Essential (primary) hypertension: Secondary | ICD-10-CM | POA: Insufficient documentation

## 2011-08-21 DIAGNOSIS — Z789 Other specified health status: Secondary | ICD-10-CM | POA: Insufficient documentation

## 2011-08-24 ENCOUNTER — Ambulatory Visit (INDEPENDENT_AMBULATORY_CARE_PROVIDER_SITE_OTHER): Payer: 59 | Admitting: Cardiology

## 2011-08-24 ENCOUNTER — Encounter: Payer: Self-pay | Admitting: Cardiology

## 2011-08-24 DIAGNOSIS — E785 Hyperlipidemia, unspecified: Secondary | ICD-10-CM

## 2011-08-24 DIAGNOSIS — I2510 Atherosclerotic heart disease of native coronary artery without angina pectoris: Secondary | ICD-10-CM | POA: Insufficient documentation

## 2011-08-24 DIAGNOSIS — Z951 Presence of aortocoronary bypass graft: Secondary | ICD-10-CM | POA: Insufficient documentation

## 2011-08-24 DIAGNOSIS — R200 Anesthesia of skin: Secondary | ICD-10-CM

## 2011-08-24 DIAGNOSIS — Z7901 Long term (current) use of anticoagulants: Secondary | ICD-10-CM

## 2011-08-24 DIAGNOSIS — K649 Unspecified hemorrhoids: Secondary | ICD-10-CM | POA: Insufficient documentation

## 2011-08-24 DIAGNOSIS — I10 Essential (primary) hypertension: Secondary | ICD-10-CM

## 2011-08-24 DIAGNOSIS — E7800 Pure hypercholesterolemia, unspecified: Secondary | ICD-10-CM

## 2011-08-24 DIAGNOSIS — R001 Bradycardia, unspecified: Secondary | ICD-10-CM

## 2011-08-24 DIAGNOSIS — R209 Unspecified disturbances of skin sensation: Secondary | ICD-10-CM

## 2011-08-24 DIAGNOSIS — I359 Nonrheumatic aortic valve disorder, unspecified: Secondary | ICD-10-CM

## 2011-08-24 DIAGNOSIS — I498 Other specified cardiac arrhythmias: Secondary | ICD-10-CM

## 2011-08-24 DIAGNOSIS — I4891 Unspecified atrial fibrillation: Secondary | ICD-10-CM

## 2011-08-24 DIAGNOSIS — E876 Hypokalemia: Secondary | ICD-10-CM

## 2011-08-24 DIAGNOSIS — I351 Nonrheumatic aortic (valve) insufficiency: Secondary | ICD-10-CM

## 2011-08-24 DIAGNOSIS — E079 Disorder of thyroid, unspecified: Secondary | ICD-10-CM

## 2011-08-24 LAB — LIPID PANEL
Cholesterol: 124 mg/dL (ref 0–200)
VLDL: 9.2 mg/dL (ref 0.0–40.0)

## 2011-08-24 LAB — BASIC METABOLIC PANEL
BUN: 18 mg/dL (ref 6–23)
Calcium: 8.9 mg/dL (ref 8.4–10.5)
Creatinine, Ser: 1 mg/dL (ref 0.4–1.5)
GFR: 78.25 mL/min (ref >60.00)

## 2011-08-24 LAB — HEPATIC FUNCTION PANEL
ALT: 34 U/L (ref 0–53)
AST: 34 U/L (ref 0–37)
Alkaline Phosphatase: 72 U/L (ref 39–117)
Bilirubin, Direct: 0.2 mg/dL (ref 0.0–0.3)
Total Bilirubin: 2 mg/dL — ABNORMAL HIGH (ref 0.3–1.2)

## 2011-08-24 LAB — TSH: TSH: 1.45 u[IU]/mL (ref 0.35–5.50)

## 2011-08-24 NOTE — Assessment & Plan Note (Signed)
He had some recent hemorrhoidal symptoms and very limited bright red blood per rectum.  At this point I have not recommended any further workup.

## 2011-08-24 NOTE — Progress Notes (Signed)
HPI Patient is seen today to followup coronary disease and atrial fibrillation.  He is doing extremely well on Tikosyn.  He thinks that he has atrial fib only approximately 10% of the time.  This is a great improvement for him.  He is exercising without difficulty.  He's not having any chest pain or shortness of breath.  As part of today's evaluation I have carefully reviewed my prior records and completely updated new EMR.   Patient mentions that he's had some rare slight numbness of one of his toes.  He would like to be sure that his glucose is normal.  He also mentions that he's had some mild hemorrhoids with slight bright red blood.  He is on Coumadin. Allergies  Allergen Reactions  . Sulfonamide Derivatives     Current Outpatient Prescriptions  Medication Sig Dispense Refill  . aspirin 81 MG tablet Take 81 mg by mouth daily.        Marland Casebier atorvastatin (LIPITOR) 20 MG tablet Take 1 tablet (20 mg total) by mouth daily.  30 tablet  12  . azithromycin (ZITHROMAX) 250 MG tablet Take 2 tablets by mouth on day 1, followed by 1 tablet by mouth daily for 4 days.        Marland Elgersma dofetilide (TIKOSYN) 250 MCG capsule Take 250 mcg and 125 mcg  Tablet  twice a day       . Multiple Vitamin (MULTIVITAMIN) capsule Take 1 capsule by mouth daily.        Marland Colligan NEXIUM 40 MG capsule TAKE (1) CAPSULE DAILY.  30 each  6  . potassium chloride SA (K-DUR,KLOR-CON) 20 MEQ tablet Take 1 tablet (20 mEq total) by mouth daily.  30 tablet  11  . ramipril (ALTACE) 2.5 MG capsule Take 1 capsule (2.5 mg total) by mouth daily.  30 capsule  11  . valACYclovir (VALTREX) 1000 MG tablet Take 1,000 mg by mouth 2 (two) times daily as needed.       . warfarin (COUMADIN) 5 MG tablet Take 1 tablet (5 mg total) by mouth as directed.  35 tablet  3  . dofetilide (TIKOSYN) 125 MCG capsule Take 1 capsule (125 mcg total) by mouth 2 (two) times daily.  60 capsule  3  . DISCONTD: TIKOSYN 125 MCG capsule TAKE (1) CAPSULE TWICE DAILY.  60 each  11     History   Social History  . Marital Status: Married    Spouse Name: N/A    Number of Children: 2  . Years of Education: 18   Occupational History  . [employer]   Social History Main Topics  . Smoking status: Never Smoker   . Smokeless tobacco: Never Used  . Alcohol Use: Yes     2 glasses of wine per day  . Drug Use: No  . Sexually Active: Yes -- Male partner(s)   Other Topics Concern  . Not on file   Social History Narrative   [employer], [place].  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  2 daughters-'75, '79; 1 grandchild; step-daughter and step grandson.  Regular exercise-yes, runs 1.5-2 mi 4x/wk, also elipticalPatient signed a Designated Party Release to allow his wife, Hussien Greenblatt, to have access to his medical records/ information.    Family History  Problem Relation Age of Onset  . Hyperlipidemia    . Hypertension    . Coronary artery disease Other 72  . Diabetes Other  Past Medical History  Diagnosis Date  . CAD (coronary artery disease) of bypass graft     Myoview March, 2011, excellent exercise, hypertensive response, no scar or ischemia, EF 61%, mild palpitations peak stress with infrequent PACs and PVCs.  Marland Mcdill HTN (hypertension)     Controlled at rest, hypertension on treadmill, sensitive to medications  . Hyperlipidemia     Low HDL  . GERD (gastroesophageal reflux disease)     Barrett's esophagus  . Colonic polyp   . A-fib      Admission for Tikosyn load, March 20, 2011, converted after first dose, QTC 440 at the time of discharge. / Office note March 27, 2011, clinically in and out of atrial, QTC 500 ms, Dr.Klein decreased Tikosyn to 375mg  BID.  Atrial fib at rest February 25, 2011 / plan to admit for [facility] therapy  . Palpitations      exercise palpitations, probably A. fib-not VT,Pindolol not tolerated, CAD, bradycardia,Tickosyn needs hospital +/_ side  effects, amiodarone & sotalol could need pacer,Multaq less effective not tried, atrial fib ablation could be considered at low risk  . Foot pain     Chronic lateral foot pain-resolved with use of orthotics '09  . Bradycardia   . Aortic stenosis     Mild, echo, April, 2012  . PVC's (premature ventricular contractions)   . Carotid artery disease     Doppler November, 2010, 0-39% bilateral followup 2 years  . Atrial flutter     Status post ablation  . Warfarin anticoagulation     Coumadin for atrial fib  . Aortic insufficiency      Mild, echo, 2010 / mild/moderate, echo, 2012  . Drug intolerance     Niaspan  . Elevated bilirubin     Mild chronic elevation, 2.0 January, 2011 stable  . BPH (benign prostatic hyperplasia)   . Drug intolerance     Pindolol before exercise not tolerated  . Ejection fraction     EF 60-65%, echo, September, 2010 / EF 60-65% echo, April, 2012  . Hypokalemia     Potassium 3.6 before potassium started April, 2012  . Hx of colonoscopy   . Primary osteoarthritis of left knee     Mild  . Chronic foot pain 2009    Resolved with use of orthotics  . Intolerance of drug     Fish oil, bilirubin  . BPH (benign prostatic hyperplasia)   . Pulmonic stenosis     Mild, echo, April, 2012  . Drug therapy     Tikosyn April, 2012  . TSH (thyroid-stimulating hormone deficiency)   . Drug intolerance     Niacin  . Hemorrhoids     September, 2012  . Numbness of toes     Slight numbness of one toe, September, 2012    Past Surgical History  Procedure Date  . Coronary artery bypass graft 2000  . Tonsillectomy     ROS  Patient denies fever, chills, headache, sweats, rash, change in vision, change in hearing, chest pain, cough, nausea vomiting, urinary symptoms.  All other systems are reviewed and are negative  PHYSICAL EXAM Patient is oriented to person time and place.  Affect is normal.  Head is atraumatic.  Eyes no xanthelasma.  Lungs are clear.  Respiratory  effort is not labored.  There is no jugular venous distention.  Neck exam reveals an S1 and S2.  There is a 2/6 systolic murmur.  The abdomen is soft.  There is no peripheral edema.  There  are no musculoskeletal deformities.  There are no skin rashes.  Filed Vitals:   08/24/11 0940  BP: 122/66  Pulse: 50  Height: 5\' 11"  (1.803 m)  Weight: 197 lb 1.9 oz (89.413 kg)    EKG is done today and reviewed by me.  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  His corrected QT interval is 425 ms.  This is a good range for him on Tikosyn.  ASSESSMENT & PLAN

## 2011-08-24 NOTE — Assessment & Plan Note (Signed)
In the past there is question in the chart of an abnormal TSH.  This will be repeated today.

## 2011-08-24 NOTE — Assessment & Plan Note (Signed)
We are being very careful to follow his potassium carefully as he is on Tikosyn.  Labs will be checked today.

## 2011-08-24 NOTE — Assessment & Plan Note (Signed)
Coronary disease is stable.  Patient needs no further workup at this time.

## 2011-08-24 NOTE — Assessment & Plan Note (Signed)
Tikosyn Has been very effective.  He feels good.  His QT interval is checked today and it is in a good range.  No change in therapy.

## 2011-08-24 NOTE — Patient Instructions (Signed)
Your physician recommends that you schedule a follow-up appointment in: 6 months with Dr. Myrtis Ser, the office will mail you a reminder letter 2 months prior appointment date. Your physician recommends that you return for lab work in: today Fasting lipid profile, LFT,BMET, TSH.

## 2011-08-24 NOTE — Assessment & Plan Note (Signed)
The patient has some slight numbness in one of his toes.  This does not appear to be a significant issue.  He raised the question of his glucose level.  This will be checked fasting today.

## 2011-08-24 NOTE — Assessment & Plan Note (Signed)
Patient is quite stable on Coumadin.  I would be willing to switch him to other meds if he chooses.  At this point he will remain on Coumadin

## 2011-08-24 NOTE — Assessment & Plan Note (Signed)
His aortic valvular disease is mild.  He does not need a followup echo.

## 2011-08-24 NOTE — Assessment & Plan Note (Signed)
Bloods are being drawn today to check his lipids.

## 2011-08-24 NOTE — Assessment & Plan Note (Signed)
He continues to have resting bradycardia but he is not symptomatic from it.  His heart rate does increase with exercise.  No change in therapy.

## 2011-09-07 ENCOUNTER — Ambulatory Visit (INDEPENDENT_AMBULATORY_CARE_PROVIDER_SITE_OTHER): Payer: 59 | Admitting: *Deleted

## 2011-09-07 DIAGNOSIS — I4891 Unspecified atrial fibrillation: Secondary | ICD-10-CM

## 2011-09-07 DIAGNOSIS — Z7901 Long term (current) use of anticoagulants: Secondary | ICD-10-CM

## 2011-09-23 ENCOUNTER — Other Ambulatory Visit: Payer: Self-pay

## 2011-09-23 MED ORDER — ESOMEPRAZOLE MAGNESIUM 40 MG PO CPDR: 40.0000 mg | DELAYED_RELEASE_CAPSULE | Freq: Every day | ORAL | Status: DC

## 2011-10-05 ENCOUNTER — Other Ambulatory Visit: Payer: Self-pay | Admitting: Cardiology

## 2011-10-05 ENCOUNTER — Ambulatory Visit (INDEPENDENT_AMBULATORY_CARE_PROVIDER_SITE_OTHER): Payer: 59 | Admitting: *Deleted

## 2011-10-05 DIAGNOSIS — Z7901 Long term (current) use of anticoagulants: Secondary | ICD-10-CM

## 2011-10-05 DIAGNOSIS — I4891 Unspecified atrial fibrillation: Secondary | ICD-10-CM

## 2011-10-05 LAB — POCT INR: INR: 2.3

## 2011-10-05 MED ORDER — WARFARIN SODIUM 5 MG PO TABS: 5.0000 mg | ORAL_TABLET | ORAL | Status: DC

## 2011-10-06 ENCOUNTER — Telehealth: Payer: Self-pay

## 2011-10-06 NOTE — Telephone Encounter (Signed)
I called the patient and advised him that it would be safe to hold his Coumadin 3-5 days to have the tooth pulled if necessary.  It is a wisdom tooth.  This might involve bleeding. It may be prudent to stop the Coumadin.

## 2011-10-06 NOTE — Telephone Encounter (Signed)
Mr Roseboom is requesting a call from Dr Myrtis Ser regarding a tooth problem.  Mr Anglemyer states he has a tooth that needs a filling and possible extraction.  He would like to discuss the bleeding issue with Dr Myrtis Ser and also discuss whether a general dentist can do this or if he needs to see a specialist because of the fact that he is on tikosyn and coumadin.

## 2011-11-16 ENCOUNTER — Ambulatory Visit (INDEPENDENT_AMBULATORY_CARE_PROVIDER_SITE_OTHER): Payer: 59 | Admitting: *Deleted

## 2011-11-16 DIAGNOSIS — I4891 Unspecified atrial fibrillation: Secondary | ICD-10-CM

## 2011-11-16 DIAGNOSIS — Z7901 Long term (current) use of anticoagulants: Secondary | ICD-10-CM

## 2011-11-16 LAB — POCT INR: INR: 2.5

## 2011-12-28 ENCOUNTER — Ambulatory Visit (INDEPENDENT_AMBULATORY_CARE_PROVIDER_SITE_OTHER): Payer: 59 | Admitting: Pharmacist

## 2011-12-28 DIAGNOSIS — Z7901 Long term (current) use of anticoagulants: Secondary | ICD-10-CM

## 2011-12-28 DIAGNOSIS — I4891 Unspecified atrial fibrillation: Secondary | ICD-10-CM

## 2011-12-28 LAB — POCT INR: INR: 2.8

## 2012-01-18 ENCOUNTER — Other Ambulatory Visit: Payer: Self-pay | Admitting: Internal Medicine

## 2012-02-02 ENCOUNTER — Ambulatory Visit (INDEPENDENT_AMBULATORY_CARE_PROVIDER_SITE_OTHER): Payer: 59 | Admitting: Internal Medicine

## 2012-02-02 ENCOUNTER — Encounter: Payer: Self-pay | Admitting: Internal Medicine

## 2012-02-02 VITALS — BP 130/58 | HR 41 | Temp 97.0°F

## 2012-02-02 DIAGNOSIS — J0100 Acute maxillary sinusitis, unspecified: Secondary | ICD-10-CM

## 2012-02-02 DIAGNOSIS — J069 Acute upper respiratory infection, unspecified: Secondary | ICD-10-CM

## 2012-02-02 MED ORDER — AMOXICILLIN-POT CLAVULANATE 875-125 MG PO TABS: 1.0000 | ORAL_TABLET | Freq: Two times a day (BID) | ORAL | Status: AC

## 2012-02-02 NOTE — Patient Instructions (Signed)
It was good to see you today. Augmentin antibiotics - Your prescription(s) have been submitted to your pharmacy. Please take as directed and contact our office if you believe you are having problem(s) with the medication(s). Use Afrin nasal spray, saline irrigation and Breathe Rite strips as dsicussed to help with congestion  Sinusitis Sinuses are air pockets within the bones of your face. The growth of bacteria within a sinus leads to infection. The infection prevents the sinuses from draining. This infection is called sinusitis. SYMPTOMS   There will be different areas of pain depending on which sinuses have become infected.  The maxillary sinuses often produce pain beneath the eyes.   Frontal sinusitis may cause pain in the middle of the forehead and above the eyes.  Other problems (symptoms) include:  Toothaches.   Colored, pus-like (purulent) drainage from the nose.   Swelling, warmth, and tenderness over the sinus areas may be signs of infection.  TREATMENT   Sinusitis is most often determined by an exam.X-rays may be taken. If x-rays have been taken, make sure you obtain your results or find out how you are to obtain them. Your caregiver may give you medications (antibiotics). These are medications that will help kill the bacteria causing the infection. You may also be given a medication (decongestant) that helps to reduce sinus swelling.   HOME CARE INSTRUCTIONS    Only take over-the-counter or prescription medicines for pain, discomfort, or fever as directed by your caregiver.   Drink extra fluids. Fluids help thin the mucus so your sinuses can drain more easily.   Applying either moist heat or ice packs to the sinus areas may help relieve discomfort.   Use saline nasal sprays to help moisten your sinuses. The sprays can be found at your local drugstore.  SEEK IMMEDIATE MEDICAL CARE IF:  You have a fever.   You have increasing pain, severe headaches, or toothache.   You  have nausea, vomiting, or drowsiness.   You develop unusual swelling around the face or trouble seeing.  MAKE SURE YOU:    Understand these instructions.   Will watch your condition.   Will get help right away if you are not doing well or get worse.  Document Released: 11/16/2005 Document Revised: 11/05/2011 Document Reviewed: 06/15/2007 [facility] Patient Information 2012 [place], [place].

## 2012-02-02 NOTE — Progress Notes (Signed)
Subjective:    HPI  complains of head cold symptoms  Onset 5 days ago, progressive symptoms  associated with myalgias, maxillary and retro-orbital sinus pressure and mild chest congestion No relief with OTC meds - Benadryl and Mucinex Precipitated by sick contacts  Past Medical History  Diagnosis Date  . CAD (coronary artery disease)     Myoview March, 2011, excellent exercise, hypertensive response, no scar or ischemia, EF 61%, mild palpitations peak stress with infrequent PACs and PVCs.  Marland Spurlock HTN (hypertension)     Controlled at rest, hypertension on treadmill, sensitive to medications  . Hyperlipidemia     Low HDL  . GERD (gastroesophageal reflux disease)     Barrett's esophagus  . Colonic polyp   . A-fib      Admission for Tikosyn load, March 20, 2011, converted after first dose, QTC 440 at the time of discharge. / Office note March 27, 2011, clinically in and out of atrial, QTC 500 ms, Dr.Klein decreased Tikosyn to 375mg  BID.  Atrial fib at rest February 25, 2011 / plan to admit for [facility] therapy  . Palpitations      exercise palpitations, probably A. fib-not VT,Pindolol not tolerated, CAD, bradycardia,Tickosyn needs hospital +/_ side effects, amiodarone & sotalol could need pacer,Multaq less effective not tried, atrial fib ablation could be considered at low risk  . Foot pain     Chronic lateral foot pain-resolved with use of orthotics '09  . Bradycardia   . Aortic stenosis     Mild, echo, April, 2012  . PVC's (premature ventricular contractions)   . Carotid artery disease     Doppler November, 2010, 0-39% bilateral followup 2 years  . Atrial flutter     Status post ablation  . Warfarin anticoagulation     Coumadin for atrial fib  . Aortic insufficiency      Mild, echo, 2010 / mild/moderate, echo, 2012  . Elevated bilirubin     Mild chronic elevation, 2.0 January, 2011 stable  . BPH (benign prostatic hyperplasia)   . Drug intolerance     Pindolol before exercise not  tolerated  . Ejection fraction     EF 60-65%, echo, September, 2010 / EF 60-65% echo, April, 2012  . Hypokalemia     Potassium 3.6 before potassium started April, 2012  . Hx of colonoscopy   . Primary osteoarthritis of left knee     Mild  . Chronic foot pain 2009    Resolved with use of orthotics  . Intolerance of drug     Fish oil, bilirubin, nacin  . BPH (benign prostatic hyperplasia)   . Pulmonic stenosis     Mild, echo, April, 2012  . Drug therapy     Tikosyn April, 2012  . TSH (thyroid-stimulating hormone deficiency)   . Hemorrhoids     September, 2012  . Numbness of toes     Slight numbness of one toe, September, 2012  . Hx of CABG     Review of Systems Constitutional: No night sweats, no unexpected weight change Pulmonary: No pleurisy or hemoptysis Cardiovascular: No chest pain or palpitations     Objective:   Physical Exam BP 130/58  Pulse 41  Temp(Src) 97 F (36.1 C) (Oral)  SpO2 95% GEN: mildly ill appearing and audible head congestion HENT: NCAT, mild sinus tenderness bilaterally, nares with clear discharge, oropharynx mild erythema, no exudate Eyes: Vision grossly intact, no conjunctivitis Lungs: Clear to auscultation without rhonchi or wheeze, no increased work of breathing Cardiovascular:  brady rate, but regular rhythm, no bilateral edema  Lab Results  Component Value Date   WBC 6.7 04/03/2011   HGB 14.7 04/03/2011   HCT 43.0 04/03/2011   PLT 190.0 04/03/2011   GLUCOSE 96 08/24/2011   CHOL 124 08/24/2011   TRIG 46.0 08/24/2011   HDL 41.50 08/24/2011   LDLCALC 73 08/24/2011   ALT 34 08/24/2011   AST 34 08/24/2011   NA 140 08/24/2011   K 4.4 08/24/2011   CL 104 08/24/2011   CREATININE 1.0 08/24/2011   BUN 18 08/24/2011   CO2 29 08/24/2011   TSH 1.45 08/24/2011   PSA 1.38 04/03/2011   INR 2.8 12/28/2011       Assessment & Plan:  Viral URI >> maxillary sinus infection Cough, postnasal drip related to above    Empiric antibiotics prescribed due to symptom  duration greater than 7 days -Augmentin selected based on sulfa allergy and other drug interaction with warfarin and Tikosyn Symptomatic care with Tylenol or Advil, topical nasal decongestants and saline irrigation, hydration and rest -  salt gargle advised as needed

## 2012-02-08 ENCOUNTER — Ambulatory Visit (INDEPENDENT_AMBULATORY_CARE_PROVIDER_SITE_OTHER): Payer: 59 | Admitting: Pharmacist

## 2012-02-08 DIAGNOSIS — I4891 Unspecified atrial fibrillation: Secondary | ICD-10-CM

## 2012-02-08 DIAGNOSIS — Z7901 Long term (current) use of anticoagulants: Secondary | ICD-10-CM

## 2012-02-22 ENCOUNTER — Ambulatory Visit (INDEPENDENT_AMBULATORY_CARE_PROVIDER_SITE_OTHER): Payer: 59 | Admitting: Internal Medicine

## 2012-02-22 ENCOUNTER — Encounter: Payer: Self-pay | Admitting: Internal Medicine

## 2012-02-22 VITALS — BP 106/68 | HR 49 | Temp 98.3°F | Resp 16 | Wt 198.5 lb

## 2012-02-22 DIAGNOSIS — Z125 Encounter for screening for malignant neoplasm of prostate: Secondary | ICD-10-CM

## 2012-02-22 DIAGNOSIS — R609 Edema, unspecified: Secondary | ICD-10-CM

## 2012-02-22 DIAGNOSIS — E785 Hyperlipidemia, unspecified: Secondary | ICD-10-CM

## 2012-02-22 DIAGNOSIS — E079 Disorder of thyroid, unspecified: Secondary | ICD-10-CM

## 2012-02-22 DIAGNOSIS — E349 Endocrine disorder, unspecified: Secondary | ICD-10-CM

## 2012-02-22 DIAGNOSIS — K2940 Chronic atrophic gastritis without bleeding: Secondary | ICD-10-CM

## 2012-02-22 DIAGNOSIS — R17 Unspecified jaundice: Secondary | ICD-10-CM

## 2012-02-22 DIAGNOSIS — E291 Testicular hypofunction: Secondary | ICD-10-CM

## 2012-02-22 DIAGNOSIS — I779 Disorder of arteries and arterioles, unspecified: Secondary | ICD-10-CM

## 2012-02-22 NOTE — Progress Notes (Signed)
  Subjective:    Patient ID: Russell Griffith, male    DOB: [birth year], 66 y.o.   MRN: [id number]  HPI Russell Griffith has  Been feeling bad: stomach issues and mild nausea. He is having bloating, gas and mid-epigastric discomfort. He wonders if it is medication related. Medication changes include split dosing of tikosyn and an increase in potassium dosing. He has had no fever, emesis, GERD, change in bowel habit.    He is interested in a trial of viagra.  Elevated bilirubin-relative recently with leukemia and there was a question of increasing bilirubin as disease marker. He has had elevated bilirubin for years previously diagnosed as Gilbert's disease.  He recently saw Dr. Felicity Coyer for sinusitis with resolution of his symptoms with treatment.  PMH, FamHx and SocHx reviewed for any changes and relevance.    Review of Systems System review is negative for any constitutional, cardiac, pulmonary, GI or neuro symptoms or complaints other than as described in the HPI.     Objective:   Physical Exam Filed Vitals:   02/22/12 1521  BP: 106/68  Pulse: 49  Temp: 98.3 F (36.8 C)  Resp: 16   Gen'l- WNWD white man in no distress or active discomfort. HEENT- C&S clear Cor- 2+ radial pulse Respirations - unlabored.       Assessment & Plan:  Nausea and upset stomach - reviewed all meds: tikosyn can cause nausea, dyspepsia and flu-like symptoms. Potassium oral supplement can cause nausea and dyspepsia  Plan - reduce potassium dose by half, current dose not clear at 20 meq bid or 10 meq bid. If symptoms improve but only partially may hold potassium for several days completely.            If no change in symptoms with manipulation of potassium doses he will need to talk with Dr.Katz about tikosyn dosing.

## 2012-02-22 NOTE — Patient Instructions (Addendum)
Nausea, abdominal discomfort and feeling "bad" may very well be drug related: both Potassium and tikosyn list nausea and abdominal pain as potential side effects. Tikosyn can also cause a flu-like feeling.  Plan - reduce the dose on the potassium - our records show 20 meq once a day : if you are taking 10 meq twice a day then stop one of those doses and if better but not resolved stop both doses. If it is 20 meq twice definitely stop one of those dose.   OK to try viagra - just be alert to any symptoms of low blood pressure - feeling lightheaded or dizzy or weak. If so - assume recumbancy.   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

## 2012-02-23 NOTE — Assessment & Plan Note (Signed)
Patient inquires as to safety of Viagra - discussed side effects and drug interactions as listed in ePocrates. Minimal risk to him of using product.  Plan - trial Rx for viagra 50 mg to use prn.

## 2012-02-23 NOTE — Assessment & Plan Note (Signed)
Reviewed Bilirubin levels to '09. He has had stable levels. CBCs reviewed - normal WBC and differentials.  Plan - reassurance           Provided handout on Gilbert's

## 2012-02-24 NOTE — Progress Notes (Signed)
Called the home number and the wife explained that he was still having trouble getting the nausea under control.  She states, from her perspective, the nausea lessened when he stopped taking the potassium pill for a day.  When he resumed the pills, she states the nausea came back.  She is texting the patient to have him call the office for a better update on how he is doing.

## 2012-03-14 ENCOUNTER — Other Ambulatory Visit: Payer: Self-pay | Admitting: *Deleted

## 2012-03-14 MED ORDER — WARFARIN SODIUM 5 MG PO TABS: ORAL_TABLET | ORAL | Status: DC

## 2012-03-21 ENCOUNTER — Ambulatory Visit (INDEPENDENT_AMBULATORY_CARE_PROVIDER_SITE_OTHER): Payer: 59 | Admitting: *Deleted

## 2012-03-21 DIAGNOSIS — I4891 Unspecified atrial fibrillation: Secondary | ICD-10-CM

## 2012-03-21 DIAGNOSIS — Z7901 Long term (current) use of anticoagulants: Secondary | ICD-10-CM

## 2012-04-04 ENCOUNTER — Ambulatory Visit (INDEPENDENT_AMBULATORY_CARE_PROVIDER_SITE_OTHER): Payer: 59 | Admitting: Sports Medicine

## 2012-04-04 ENCOUNTER — Other Ambulatory Visit (INDEPENDENT_AMBULATORY_CARE_PROVIDER_SITE_OTHER): Payer: 59

## 2012-04-04 VITALS — BP 140/74

## 2012-04-04 DIAGNOSIS — E079 Disorder of thyroid, unspecified: Secondary | ICD-10-CM

## 2012-04-04 DIAGNOSIS — E785 Hyperlipidemia, unspecified: Secondary | ICD-10-CM

## 2012-04-04 DIAGNOSIS — M722 Plantar fascial fibromatosis: Secondary | ICD-10-CM

## 2012-04-04 DIAGNOSIS — Z125 Encounter for screening for malignant neoplasm of prostate: Secondary | ICD-10-CM

## 2012-04-04 DIAGNOSIS — K2940 Chronic atrophic gastritis without bleeding: Secondary | ICD-10-CM

## 2012-04-04 DIAGNOSIS — I779 Disorder of arteries and arterioles, unspecified: Secondary | ICD-10-CM

## 2012-04-04 DIAGNOSIS — E349 Endocrine disorder, unspecified: Secondary | ICD-10-CM

## 2012-04-04 LAB — CBC WITH DIFFERENTIAL/PLATELET
Basophils Absolute: 0 10*3/uL (ref 0.0–0.1)
Basophils Relative: 0.3 % (ref 0.0–3.0)
Eosinophils Absolute: 0.2 10*3/uL (ref 0.0–0.7)
Lymphocytes Relative: 21.7 % (ref 12.0–46.0)
MCHC: 34.1 g/dL (ref 30.0–36.0)
MCV: 88.3 fl (ref 78.0–100.0)
Monocytes Absolute: 1.2 10*3/uL — ABNORMAL HIGH (ref 0.1–1.0)
Neutrophils Relative %: 57.2 % (ref 43.0–77.0)
Platelets: 159 10*3/uL (ref 150.0–400.0)
RBC: 4.96 Mil/uL (ref 4.22–5.81)
RDW: 13.3 % (ref 11.5–14.6)

## 2012-04-04 LAB — LIPID PANEL
Cholesterol: 119 mg/dL (ref 0–200)
HDL: 31.8 mg/dL — ABNORMAL LOW (ref >39.00)
LDL Cholesterol: 78 mg/dL (ref 0–99)
Total CHOL/HDL Ratio: 4
Triglycerides: 46 mg/dL (ref 0.0–149.0)
VLDL: 9.2 mg/dL (ref 0.0–40.0)

## 2012-04-04 LAB — COMPREHENSIVE METABOLIC PANEL WITH GFR
ALT: 26 U/L (ref 0–53)
AST: 30 U/L (ref 0–37)
Albumin: 3.9 g/dL (ref 3.5–5.2)
Alkaline Phosphatase: 68 U/L (ref 39–117)
BUN: 21 mg/dL (ref 6–23)
CO2: 27 meq/L (ref 19–32)
Calcium: 9.2 mg/dL (ref 8.4–10.5)
Chloride: 106 meq/L (ref 96–112)
Creatinine, Ser: 1 mg/dL (ref 0.4–1.5)
GFR: 80.83 mL/min (ref >60.00)
Glucose, Bld: 100 mg/dL — ABNORMAL HIGH (ref 70–99)
Potassium: 4.5 meq/L (ref 3.5–5.1)
Sodium: 142 meq/L (ref 135–145)
Total Bilirubin: 1.9 mg/dL — ABNORMAL HIGH (ref 0.3–1.2)
Total Protein: 6.5 g/dL (ref 6.0–8.3)

## 2012-04-04 LAB — TSH: TSH: 4.45 u[IU]/mL (ref 0.35–5.50)

## 2012-04-04 LAB — PSA: PSA: 1.09 ng/mL (ref 0.10–4.00)

## 2012-04-04 LAB — HEPATIC FUNCTION PANEL
AST: 30 U/L (ref 0–37)
Albumin: 3.9 g/dL (ref 3.5–5.2)
Total Bilirubin: 1.9 mg/dL — ABNORMAL HIGH (ref 0.3–1.2)

## 2012-04-04 NOTE — Assessment & Plan Note (Signed)
We will add a heel cup to both shoes to use all the time  Continue orthotics and workout shoes  R. strap for the right  Ice at night  Keep up exercises and stretches  Recheck in 2 months  Incidentally we scanned his left third finger and there is a nodule at the MCP joint

## 2012-04-04 NOTE — Patient Instructions (Signed)
Please use arch strap on right foot when you are doing a lot of walking or exercising  Use heel cups in shoes as often as possible  Ice feet in a pan of ice water 5-10 minutes per day- ok to take your foot in and out of ice water as needed  Ok to continue using elliptical  Please follow up in 2 months  Thank you for seeing [place] today!

## 2012-04-04 NOTE — Progress Notes (Signed)
  Subjective:    Patient ID: Russell Griffith, male    DOB: [birth year], 66 y.o.   MRN: [id number]  HPI  Pt presents to clinic for evaluation of rt plantar fasciitis that has been flared up x 2 months. Has not changed shoes or work out regimen since this started. Pain worst in the morning.  Uses elliptical for exercise.   Several years ago we treated him for plantar fasciitis on the right With stretches exercises and orthotics he resolved his symptoms for the last several years He uses the orthotics for workouts only No pain until his recent flare  Lt 3rd digit swollen and  painful since recent beach trip.  Has improved over the last 2 days  Lt knee painful and warm to touch.  Pt thinks this is related known torn meniscus.   Review of Systems     Objective:   Physical Exam No acute distress  Rt great toe extends 10 deg, 15 deg of flexion Lt great toe 20 deg extension, 25 deg flexion bilat moderate arch Good post tib function Tenderness to the insertion at the medial plantar fascia to the calcaneus  MSK ultrasound Right plantar fascia is thick There is a calcified spur that appears to penetrate into the actual fascial membrane Measures 0.57 cm  Left plantar fascia also has some calcification Thicknesses almost normal at 0.47  Achilles tendon normal bilaterally        Assessment & Plan:

## 2012-04-06 LAB — TESTOSTERONE, FREE, TOTAL, SHBG
Sex Hormone Binding: 54 nmol/L (ref 13–71)
Testosterone, Free: 62.5 pg/mL (ref 47.0–244.0)
Testosterone-% Free: 1.5 % — ABNORMAL LOW (ref 1.6–2.9)
Testosterone: 424.12 ng/dL (ref 300–890)

## 2012-04-11 ENCOUNTER — Encounter: Payer: Self-pay | Admitting: Internal Medicine

## 2012-04-11 ENCOUNTER — Ambulatory Visit (INDEPENDENT_AMBULATORY_CARE_PROVIDER_SITE_OTHER): Payer: 59 | Admitting: Internal Medicine

## 2012-04-11 VITALS — BP 120/78 | HR 70 | Temp 97.6°F | Resp 16 | Wt 196.0 lb

## 2012-04-11 DIAGNOSIS — Z0000 Encounter for general adult medical examination without abnormal findings: Secondary | ICD-10-CM

## 2012-04-11 DIAGNOSIS — Z86010 Personal history of colonic polyps: Secondary | ICD-10-CM

## 2012-04-11 DIAGNOSIS — I2510 Atherosclerotic heart disease of native coronary artery without angina pectoris: Secondary | ICD-10-CM

## 2012-04-11 DIAGNOSIS — M722 Plantar fascial fibromatosis: Secondary | ICD-10-CM

## 2012-04-11 DIAGNOSIS — R001 Bradycardia, unspecified: Secondary | ICD-10-CM

## 2012-04-11 DIAGNOSIS — N401 Enlarged prostate with lower urinary tract symptoms: Secondary | ICD-10-CM

## 2012-04-11 DIAGNOSIS — K2270 Barrett's esophagus without dysplasia: Secondary | ICD-10-CM

## 2012-04-11 DIAGNOSIS — I4891 Unspecified atrial fibrillation: Secondary | ICD-10-CM

## 2012-04-11 DIAGNOSIS — M23302 Other meniscus derangements, unspecified lateral meniscus, unspecified knee: Secondary | ICD-10-CM

## 2012-04-11 DIAGNOSIS — R9430 Abnormal result of cardiovascular function study, unspecified: Secondary | ICD-10-CM

## 2012-04-11 DIAGNOSIS — I498 Other specified cardiac arrhythmias: Secondary | ICD-10-CM

## 2012-04-11 NOTE — Progress Notes (Signed)
Subjective:    Patient ID: YON SCHIFFMAN, male    DOB: [birth year], 66 y.o.   MRN: [id number]  HPI Mr. Monteforte presents for an annual exam. He has had a flare of plantar fasciitis and has seen Dr. Darrick Penna. For now he is using a strap, stretches and icing. He will return to Dr. Darrick Penna if needed to consider injection therapy. He saw Dr. Myrtis Ser in Sept '12 and was stable. He has seen GI with colonoscopy May '11 with adenomatous polyp. He had EGD May '11 - appears to be Barrett's but path report with alcian stain was negative for goblet metaplasia.  He is otherwise feeling well. His blood pressure has been good.    The risk factors are reflected in the social history.  The roster of all physicians providing medical care to patient - is listed in the Snapshot section of the chart.  Activities of daily living:  The patient is 100% inedpendent in all ADLs: dressing, toileting, feeding as well as independent mobility  Home safety : The patient has smoke detectors in the home. House is relatively fall safe.  They wear seatbelts. No firearms at home   There is no risks for hepatitis, STDs or HIV. There is no   history of blood transfusion. They have no travel history to infectious disease endemic areas of the world.  They deny any hearing difficulty and have not had audiologic testing in the last year.  They do not  have excessive sun exposure. Discussed the need for sun protection: hats, long sleeves and use of sunscreen if there is significant sun exposure.   Diet: the importance of a healthy diet is discussed. They do have a healthy diet.  The patient has a regular exercise program: ellipitcal ,  30 min  duration,  5per week.  The benefits of regular aerobic exercise were discussed.  Depression screen: there are no signs or vegative symptoms of depression- irritability, change in appetite, anhedonia, sadness/tearfullness.  Cognitive assessment: the patient manages all their financial and personal  affairs and is actively engaged.   The following portions of the patient's history were reviewed and updated as appropriate: allergies, current medications, past family history, past medical history,  past surgical history, past social history  and problem list.  Vision, hearing, body mass index were assessed and reviewed.   During the course of the visit the patient was educated and counseled about appropriate screening and preventive services including : fall prevention , diabetes screening, nutrition counseling, colorectal cancer screening, and recommended immunizations.  Past Medical History  Diagnosis Date  . CAD (coronary artery disease)     Myoview March, 2011, excellent exercise, hypertensive response, no scar or ischemia, EF 61%, mild palpitations peak stress with infrequent PACs and PVCs.  Marland Krise HTN (hypertension)     Controlled at rest, hypertension on treadmill, sensitive to medications  . Hyperlipidemia     Low HDL  . GERD (gastroesophageal reflux disease)     Barrett's esophagus  . Colonic polyp   . A-fib      Admission for Tikosyn load, March 20, 2011, converted after first dose, QTC 440 at the time of discharge. / Office note March 27, 2011, clinically in and out of atrial, QTC 500 ms, Dr.Klein decreased Tikosyn to 375mg  BID.  Atrial fib at rest February 25, 2011 / plan to admit for [facility] therapy  . Palpitations      exercise palpitations, probably A. fib-not VT,Pindolol not tolerated, CAD, bradycardia,Tickosyn needs hospital +/_  side effects, amiodarone & sotalol could need pacer,Multaq less effective not tried, atrial fib ablation could be considered at low risk  . Foot pain     Chronic lateral foot pain-resolved with use of orthotics '09  . Bradycardia   . Aortic stenosis     Mild, echo, April, 2012  . PVC's (premature ventricular contractions)   . Carotid artery disease     Doppler November, 2010, 0-39% bilateral followup 2 years  . Atrial flutter     Status post  ablation  . Warfarin anticoagulation     Coumadin for atrial fib  . Aortic insufficiency      Mild, echo, 2010 / mild/moderate, echo, 2012  . Elevated bilirubin     Mild chronic elevation, 2.0 January, 2011 stable  . BPH (benign prostatic hyperplasia)   . Drug intolerance     Pindolol before exercise not tolerated  . Ejection fraction     EF 60-65%, echo, September, 2010 / EF 60-65% echo, April, 2012  . Hypokalemia     Potassium 3.6 before potassium started April, 2012  . Hx of colonoscopy   . Primary osteoarthritis of left knee     Mild  . Chronic foot pain 2009    Resolved with use of orthotics  . Intolerance of drug     Fish oil, bilirubin, nacin  . BPH (benign prostatic hyperplasia)   . Pulmonic stenosis     Mild, echo, April, 2012  . Drug therapy     Tikosyn April, 2012  . TSH (thyroid-stimulating hormone deficiency)   . Hemorrhoids     September, 2012  . Numbness of toes     Slight numbness of one toe, September, 2012  . Hx of CABG    Past Surgical History  Procedure Date  . Coronary artery bypass graft 2000  . Tonsillectomy    Family History  Problem Relation Age of Onset  . Hyperlipidemia    . Hypertension    . Coronary artery disease Other 72  . Diabetes Other    History   Social History  . Marital Status: Married    Spouse Name: N/A    Number of Children: 2  . Years of Education: 18   Occupational History  . [employer] laboratory [employer]   Social History Main Topics  . Smoking status: Never Smoker   . Smokeless tobacco: Never Used  . Alcohol Use: Yes     2 glasses of wine per day  . Drug Use: No  . Sexually Active: Yes -- Male partner(s)   Other Topics Concern  . Not on file   Social History Narrative   [employer] [place], [place].  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  2 daughters-'75, '79; 1 grandchild; step-daughter and step grandson.  Regular exercise-yes, runs 1.5-2 mi  4x/wk, also elipticalPatient signed a Designated Party Release to allow his wife, Omar Gayden, to have access to his medical records/ information.         Review of Systems Constitutional:  Negative for fever, chills, activity change and unexpected weight change.  HEENT:  Negative for hearing loss, ear pain, congestion, neck stiffness and postnasal drip. Negative for sore throat or swallowing problems. Negative for dental complaints.   Eyes: Negative for vision loss or change in visual acuity.  Respiratory: Negative for chest tightness and wheezing. Negative for DOE.   Cardiovascular: Negative for chest pain or palpitations. No decreased exercise tolerance Gastrointestinal:  No change in bowel habit. No bloating or gas. No reflux or indigestion Genitourinary: Negative for urgency, frequency, flank pain and difficulty urinating.  Musculoskeletal: Negative for myalgias, back pain, arthralgias and gait problem.  Neurological: Negative for dizziness, tremors, weakness and headaches.  Hematological: Negative for adenopathy.  Psychiatric/Behavioral: Negative for behavioral problems and dysphoric mood.       Objective:   Physical Exam Filed Vitals:   04/11/12 0910  BP: 120/78  Pulse: 70  Temp: 97.6 F (36.4 C)  Resp: 16   Wt Readings from Last 3 Encounters:  04/11/12 196 lb (88.905 kg)  02/22/12 198 lb 8 oz (90.039 kg)  08/24/11 197 lb 1.9 oz (89.413 kg)   Gen'l- WNWD white man who looks younger than his stated age. HEENT - [place]/AT, oropharynx w/o lesions, teeth in good repair. Neck - supply, no thyromegaly Cor- 2+ radial and  DP/PT pulses, quiet precordium, regular bradycardia w/o murmur Pulm - normal respirations Abd - soft, no guarding or rebound, no HSM Genitalia - deferred to normal PSA Ext- normal Neuro - non-focal Derm - clear for any suspicious lesions face, neck, arms.  Lab Results  Component Value Date   WBC 6.7 04/04/2012   HGB 14.9 04/04/2012   HCT 43.8 04/04/2012     PLT 159.0 04/04/2012   GLUCOSE 100* 04/04/2012   CHOL 119 04/04/2012   TRIG 46.0 04/04/2012   HDL 31.80* 04/04/2012   LDLCALC 78 04/04/2012        ALT 26 04/04/2012   AST 30 04/04/2012        NA 142 04/04/2012   K 4.5 04/04/2012   CL 106 04/04/2012   CREATININE 1.0 04/04/2012   BUN 21 04/04/2012   CO2 27 04/04/2012   TSH 4.45 04/04/2012   PSA 1.09 04/04/2012   INR 2.6 03/21/2012             Assessment & Plan:

## 2012-04-11 NOTE — Assessment & Plan Note (Signed)
Last colonoscopy May '11 - with adenomatous polyp should be for recall study in May '16

## 2012-04-11 NOTE — Assessment & Plan Note (Signed)
Bradycardic on exam but asymptomatic. He is able to exercise without limitation.  Plan   no diagnostic testing indicated at this time.

## 2012-04-11 NOTE — Assessment & Plan Note (Signed)
Following with Dr. Darrick Penna - currently with conservative therapy: foot wrap, stretching, icing. For persistent pain, and in preparation for a [place] holiday to [facility] and to [place] he will consider cortisone injection therapy if no improvement over the next several weeks.

## 2012-04-11 NOTE — Assessment & Plan Note (Addendum)
Doing well. Occasional palpitations but no sustained bouts of tachyrhythmia and no limitations in activity.  Plan Follow-up with Dr. Myrtis Ser as scheduled.  Continues on warfarin - may consider Xeralto in the future.

## 2012-04-11 NOTE — Assessment & Plan Note (Signed)
Last 2D echo April '12 - EF 60-65% normal range and unchanged from study of '10.  Plan  Repeat Echo in '14, sooner if symptomatic.

## 2012-04-11 NOTE — Assessment & Plan Note (Signed)
Last study November '10 - 0-39% occlussion (basically none) with recommendation for f/u study in 2 years - now overdue.  Plan - will schedule carotid dopplers at his convenience - he will call when ready to have scheduled.

## 2012-04-11 NOTE — Assessment & Plan Note (Signed)
Very stable. Able to use elliptical for 30 minutes w/o problems.

## 2012-04-11 NOTE — Assessment & Plan Note (Signed)
Last study in March '11 - did well. He is asymptomatic with no limitations in activity.  Plan  Watchful waiting

## 2012-04-11 NOTE — Assessment & Plan Note (Addendum)
Interval history significant for plantar fasciitis right foot. No major illness, surgery or injury in the last year. Limited physical exam notable for bradycardia otherwise unremarkable. He is current for colorectal cancer screening, prostate cancer screening with last PSA 1.09. Immunizations are up to date except unable to locate record of shingles vaccine in EPIC, [employer] or billing records.   In summary - a very nice man with a complex medical history/problem list who is at this time medically stable. He will continue his exercise and health diet along with rest and relaxation as possible.  He will keep follow-up appointment with Dr. Myrtis Ser and Dr. Darrick Penna. He will return as needed on in one year.

## 2012-04-11 NOTE — Assessment & Plan Note (Signed)
Long-term chronic reflux  With negative pathology at last ES/GE biopsy May '11.  Plan  Continue PPI therapy  EGD surveillance per GI

## 2012-04-11 NOTE — Assessment & Plan Note (Signed)
Doing well. Minimal nocturia. On no medication.  Plan  Watch for symptoms. Several medical options available should he become more symptomatic.

## 2012-04-18 ENCOUNTER — Other Ambulatory Visit: Payer: Self-pay | Admitting: Cardiology

## 2012-04-18 MED ORDER — RAMIPRIL 2.5 MG PO CAPS: 2.5000 mg | ORAL_CAPSULE | Freq: Every day | ORAL | Status: DC

## 2012-05-04 ENCOUNTER — Ambulatory Visit (INDEPENDENT_AMBULATORY_CARE_PROVIDER_SITE_OTHER): Payer: 59 | Admitting: *Deleted

## 2012-05-04 DIAGNOSIS — I4891 Unspecified atrial fibrillation: Secondary | ICD-10-CM

## 2012-05-04 DIAGNOSIS — Z7901 Long term (current) use of anticoagulants: Secondary | ICD-10-CM

## 2012-05-09 ENCOUNTER — Other Ambulatory Visit: Payer: Self-pay | Admitting: *Deleted

## 2012-05-09 MED ORDER — ATORVASTATIN CALCIUM 20 MG PO TABS: 20.0000 mg | ORAL_TABLET | Freq: Every day | ORAL | Status: DC

## 2012-05-23 ENCOUNTER — Other Ambulatory Visit: Payer: Self-pay | Admitting: *Deleted

## 2012-05-23 ENCOUNTER — Encounter: Payer: Self-pay | Admitting: Gastroenterology

## 2012-05-23 ENCOUNTER — Ambulatory Visit (INDEPENDENT_AMBULATORY_CARE_PROVIDER_SITE_OTHER): Payer: 59 | Admitting: Gastroenterology

## 2012-05-23 VITALS — BP 132/68 | HR 60 | Ht 71.0 in | Wt 198.0 lb

## 2012-05-23 DIAGNOSIS — R110 Nausea: Secondary | ICD-10-CM

## 2012-05-23 MED ORDER — POTASSIUM CHLORIDE 20 MEQ PO PACK: 20.0000 meq | PACK | Freq: Every day | ORAL | Status: DC

## 2012-05-23 NOTE — Patient Instructions (Addendum)
Cut back on the neixum (every other day for two weeks, then off completley). Take zantac/pepcid (get the generic equivalent), take one pill at bedtime every night. Change you potassium timing to bedtime dosing. Call in 4-5 weeks to report on your symptoms.

## 2012-05-23 NOTE — Progress Notes (Signed)
Review of pertinent gastrointestinal problems: 1. History of Barrett's esophagus. No dysplasia seen on serial endoscopies 2004, 2005, 2007. Most recent EGD 2009 found short segment of Barrett's appearing mucosa, biopsies did not confirm intestinal metaplasia however.   EGD may 2011 also found short segment, non-nodular Barrett's mucosa however biopsies again showed no intestinal metaplasia.  Recall EGD recommended at 2-1/2 years 2. Personal history of precancerous colon polyps. Adenomatous polyp removed 2003 by Dr. Corinda Gubler. Followup colonoscopy 2005 found no polyps. Dr. Doreatha Martin recommended that he have a repeat colonoscopy at 7 year interval.  Colonoscopy may 2011 found left-sided diverticulosis, small colon polyp that was an adenoma. Recall colonoscopy recommended at 5 years.   HPI: This is a  very pleasant 66 year old man whom I last saw about 2 years ago. He volunteers on the [employer]  Over past 6 months, intermittent abd discomfort, daily nausea. Intermittent loose stools.  Has felt a bit distended.    Has started on potassium shortly before then. (about a year ago).   Cut back to qd instead of twice daily (cut bacck 3-4 months ago).    He has not had nausea in 3-4 weeks..  Potassium.  Valtrex takes very rarely.  Takes no nsaids.  Generally good health.  Excercises  Past Medical History  Diagnosis Date  . CAD (coronary artery disease)     Myoview March, 2011, excellent exercise, hypertensive response, no scar or ischemia, EF 61%, mild palpitations peak stress with infrequent PACs and PVCs.  Marland Gajda HTN (hypertension)     Controlled at rest, hypertension on treadmill, sensitive to medications  . Hyperlipidemia     Low HDL  . GERD (gastroesophageal reflux disease)     Barrett's esophagus  . Colonic polyp   . A-fib      Admission for Tikosyn load, March 20, 2011, converted after first dose, QTC 440 at the time of discharge. / Office note March 27, 2011, clinically in and  out of atrial, QTC 500 ms, Dr.Klein decreased Tikosyn to 375mg  BID.  Atrial fib at rest February 25, 2011 / plan to admit for [facility] therapy  . Palpitations      exercise palpitations, probably A. fib-not VT,Pindolol not tolerated, CAD, bradycardia,Tickosyn needs hospital +/_ side effects, amiodarone & sotalol could need pacer,Multaq less effective not tried, atrial fib ablation could be considered at low risk  . Foot pain     Chronic lateral foot pain-resolved with use of orthotics '09  . Bradycardia   . Aortic stenosis     Mild, echo, April, 2012  . PVC's (premature ventricular contractions)   . Carotid artery disease     Doppler November, 2010, 0-39% bilateral followup 2 years  . Atrial flutter     Status post ablation  . Warfarin anticoagulation     Coumadin for atrial fib  . Aortic insufficiency      Mild, echo, 2010 / mild/moderate, echo, 2012  . Elevated bilirubin     Mild chronic elevation, 2.0 January, 2011 stable  . BPH (benign prostatic hyperplasia)   . Drug intolerance     Pindolol before exercise not tolerated  . Ejection fraction     EF 60-65%, echo, September, 2010 / EF 60-65% echo, April, 2012  . Hypokalemia     Potassium 3.6 before potassium started April, 2012  . Hx of colonoscopy   . Primary osteoarthritis of left knee     Mild  . Chronic foot pain 2009    Resolved with use of  orthotics  . Intolerance of drug     Fish oil, bilirubin, nacin  . BPH (benign prostatic hyperplasia)   . Pulmonic stenosis     Mild, echo, April, 2012  . Drug therapy     Tikosyn April, 2012  . TSH (thyroid-stimulating hormone deficiency)   . Hemorrhoids     September, 2012  . Numbness of toes     Slight numbness of one toe, September, 2012  . Hx of CABG     Past Surgical History  Procedure Date  . Coronary artery bypass graft 2000  . Tonsillectomy     Current Outpatient Prescriptions  Medication Sig Dispense Refill  . aspirin 81 MG tablet Take 81 mg by mouth daily.         Marland Daloia atorvastatin (LIPITOR) 20 MG tablet Take 1 tablet (20 mg total) by mouth daily.  30 tablet  3  . Calcium & Magnesium Carbonates (MYLANTA PO) Take by mouth as needed.      . dofetilide (TIKOSYN) 125 MCG capsule Take 125 mcg by mouth 2 (two) times daily.      Marland Kalla esomeprazole (NEXIUM) 40 MG capsule Take 1 capsule (40 mg total) by mouth daily before breakfast.  30 capsule  11  . Multiple Vitamin (MULTIVITAMIN) capsule Take 1 capsule by mouth daily.        . potassium chloride (KLOR-CON) 20 MEQ packet Take 20 mEq by mouth daily.      . ramipril (ALTACE) 2.5 MG capsule Take 1 capsule (2.5 mg total) by mouth daily.  30 capsule  5  . TIKOSYN 250 MCG capsule TAKE (1) CAPSULE TWICE DAILY.  60 each  6  . valACYclovir (VALTREX) 1000 MG tablet Take 1,000 mg by mouth as needed.       . warfarin (COUMADIN) 5 MG tablet Take as directed by [facility]  35 tablet  3    Allergies as of 05/23/2012 - Review Complete 05/23/2012  Allergen Reaction Noted  . Sulfonamide derivatives      Family History  Problem Relation Age of Onset  . Hyperlipidemia    . Hypertension    . Coronary artery disease Other 72  . Diabetes Other   . Colon cancer Neg Hx     History   Social History  . Marital Status: Married    Spouse Name: N/A    Number of Children: 2  . Years of Education: 18   Occupational History  . [employer] laboratory [employer]   Social History Main Topics  . Smoking status: Never Smoker   . Smokeless tobacco: Never Used  . Alcohol Use: Yes     2 glasses of wine per day  . Drug Use: No  . Sexually Active: Yes -- Male partner(s)   Other Topics Concern  . Not on file   Social History Narrative   [employer] [place], [place].  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  2 daughters-'75, '79; 1 grandchild; step-daughter and step grandson.  Regular exercise-yes, runs 1.5-2 mi 4x/wk, also elipticalPatient signed a Designated Party  Release to allow his wife, Shimshon Narula, to have access to his medical records/ information.      Physical Exam: BP 132/68  Pulse 60  Ht 5\' 11"  (1.803 m)  Wt 198 lb (89.812 kg)  BMI 27.62 kg/m2 Constitutional: generally well-appearing Psychiatric: alert and oriented x3 Abdomen: soft, nontender, nondistended, no obvious ascites, no peritoneal signs, normal bowel sounds  Assessment and plan: 66 y.o. male with mild chronic nausea, intermittent dyspepsia, intermittent loose stools  He has had colonoscopy and upper endoscopy in the past 2 years and I don't think those need to be repeated now. The #1 side effect of oral potassium his nausea and his nausea did start shortly after the potassium. Cutting back on that dose seems to help however not completely. I recommended he try taking a potassium at bedtime to see if that can decrease some of his nausea symptoms. Nexium can cause nausea and since I have seen no Barrett's changes, actual intestinal metaplasia on biopsies dating back to 2008 I think he is fine to cut back, wean off the Nexium and replaced with H2 blocker instead. He will call to report on his symptoms in 3-4 weeks and sooner if needed.

## 2012-06-15 ENCOUNTER — Ambulatory Visit (INDEPENDENT_AMBULATORY_CARE_PROVIDER_SITE_OTHER): Payer: 59 | Admitting: Cardiology

## 2012-06-15 ENCOUNTER — Encounter: Payer: Self-pay | Admitting: Cardiology

## 2012-06-15 ENCOUNTER — Ambulatory Visit (INDEPENDENT_AMBULATORY_CARE_PROVIDER_SITE_OTHER): Payer: 59 | Admitting: *Deleted

## 2012-06-15 VITALS — BP 122/68 | HR 45 | Ht 71.0 in | Wt 195.0 lb

## 2012-06-15 DIAGNOSIS — R0602 Shortness of breath: Secondary | ICD-10-CM | POA: Insufficient documentation

## 2012-06-15 DIAGNOSIS — I2510 Atherosclerotic heart disease of native coronary artery without angina pectoris: Secondary | ICD-10-CM

## 2012-06-15 DIAGNOSIS — R002 Palpitations: Secondary | ICD-10-CM

## 2012-06-15 DIAGNOSIS — I4891 Unspecified atrial fibrillation: Secondary | ICD-10-CM

## 2012-06-15 DIAGNOSIS — R001 Bradycardia, unspecified: Secondary | ICD-10-CM

## 2012-06-15 DIAGNOSIS — I350 Nonrheumatic aortic (valve) stenosis: Secondary | ICD-10-CM

## 2012-06-15 DIAGNOSIS — E876 Hypokalemia: Secondary | ICD-10-CM

## 2012-06-15 DIAGNOSIS — Z7901 Long term (current) use of anticoagulants: Secondary | ICD-10-CM

## 2012-06-15 DIAGNOSIS — Z79899 Other long term (current) drug therapy: Secondary | ICD-10-CM

## 2012-06-15 DIAGNOSIS — R110 Nausea: Secondary | ICD-10-CM

## 2012-06-15 DIAGNOSIS — K219 Gastro-esophageal reflux disease without esophagitis: Secondary | ICD-10-CM

## 2012-06-15 DIAGNOSIS — I498 Other specified cardiac arrhythmias: Secondary | ICD-10-CM

## 2012-06-15 DIAGNOSIS — I359 Nonrheumatic aortic valve disorder, unspecified: Secondary | ICD-10-CM

## 2012-06-15 NOTE — Assessment & Plan Note (Signed)
The patient does have GERD. Recently he felt worse when his Nexium dose was lowered. He will follow with GI.

## 2012-06-15 NOTE — Assessment & Plan Note (Signed)
The patient is receiving potassium so that we are sure he does not have hypokalemia. This is very important while he is on Tikosyn

## 2012-06-15 NOTE — Progress Notes (Signed)
HPI  Patient is seen today for cardiology followup. Overall he is actually doing relatively well. He has noted some shortness of breath. This seems to occur while he is sitting still. He sometimes once to take a deep breath. He is not having PND or orthopnea. He is exercising well when he works on the [employer], materials. He's not having any syncope or presyncope.  He has also noted some increase in the frequency of feeling short episodes of atrial fibrillation. He remains on Tikosyn.  The patient had nausea related to potassium. He is now taking it at night. His potassium was 4.5 in May, 2013, we have to keep it above 4 because of his Tikosyn therapy.  The patient has known significant GERD. Attempt was made to cut down the frequency of his Nexium. He had significant return of symptoms and restarted his Nexium. He'll be following up with GI.  Allergies  Allergen Reactions  . Sulfonamide Derivatives     Current Outpatient Prescriptions  Medication Sig Dispense Refill  . aspirin 81 MG tablet Take 81 mg by mouth daily.        Marland Prevette atorvastatin (LIPITOR) 20 MG tablet Take 1 tablet (20 mg total) by mouth daily.  30 tablet  3  . Calcium & Magnesium Carbonates (MYLANTA PO) Take by mouth as needed.      . dofetilide (TIKOSYN) 125 MCG capsule Take 125 mcg by mouth 2 (two) times daily.      Marland Syler dofetilide (TIKOSYN) 250 MCG capsule Take 250 mcg by mouth 2 (two) times daily.      Marland Morath esomeprazole (NEXIUM) 40 MG capsule Take 1 capsule (40 mg total) by mouth daily before breakfast.  30 capsule  11  . Multiple Vitamin (MULTIVITAMIN) capsule Take 1 capsule by mouth daily.        . potassium chloride (MICRO-K) 10 MEQ CR capsule Take 10 mEq by mouth daily.      . ramipril (ALTACE) 2.5 MG capsule Take 1 capsule (2.5 mg total) by mouth daily.  30 capsule  5  . TIKOSYN 250 MCG capsule TAKE (1) CAPSULE TWICE DAILY.  60 each  6  . valACYclovir (VALTREX) 1000 MG tablet Take 1,000 mg by mouth as needed.       .  warfarin (COUMADIN) 5 MG tablet Take as directed by [facility]  35 tablet  3    History   Social History  . Marital Status: Married    Spouse Name: N/A    Number of Children: 2  . Years of Education: 18   Occupational History  . [employer]   Social History Main Topics  . Smoking status: Never Smoker   . Smokeless tobacco: Never Used  . Alcohol Use: Yes     2 glasses of wine per day  . Drug Use: No  . Sexually Active: Yes -- Male partner(s)   Other Topics Concern  . Not on file   Social History Narrative   [employer] [place], [place].  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  2 daughters-'75, '79; 1 grandchild; step-daughter and step grandson.  Regular exercise-yes, runs 1.5-2 mi 4x/wk, also elipticalPatient signed a Designated Party Release to allow his wife, Lakeem Rozo, to have access to his medical records/ information.    Family History  Problem Relation Age of Onset  . Hyperlipidemia    . Hypertension    . Coronary artery disease Other 72  .  Diabetes Other   . Colon cancer Neg Hx     Past Medical History  Diagnosis Date  . CAD (coronary artery disease)     Myoview March, 2011, excellent exercise, hypertensive response, no scar or ischemia, EF 61%, mild palpitations peak stress with infrequent PACs and PVCs.  Marland Mittal HTN (hypertension)     Controlled at rest, hypertension on treadmill, sensitive to medications  . Hyperlipidemia     Low HDL  . GERD (gastroesophageal reflux disease)     Barrett's esophagus  . Colonic polyp   . A-fib      Admission for Tikosyn load, March 20, 2011, converted after first dose, QTC 440 at the time of discharge. / Office note March 27, 2011, clinically in and out of atrial, QTC 500 ms, Dr.Klein decreased Tikosyn to 375mg  BID.  Atrial fib at rest February 25, 2011 / plan to admit for [facility] therapy  . Palpitations      exercise palpitations, probably A.  fib-not VT,Pindolol not tolerated, CAD, bradycardia,Tickosyn needs hospital +/_ side effects, amiodarone & sotalol could need pacer,Multaq less effective not tried, atrial fib ablation could be considered at low risk  . Foot pain     Chronic lateral foot pain-resolved with use of orthotics '09  . Bradycardia   . Aortic stenosis     Mild, echo, April, 2012  . PVC's (premature ventricular contractions)   . Carotid artery disease     Doppler November, 2010, 0-39% bilateral followup 2 years  . Atrial flutter     Status post ablation  . Warfarin anticoagulation     Coumadin for atrial fib  . Aortic insufficiency      Mild, echo, 2010 / mild/moderate, echo, 2012  . Elevated bilirubin     Mild chronic elevation, 2.0 January, 2011 stable  . BPH (benign prostatic hyperplasia)   . Drug intolerance     Pindolol before exercise not tolerated  . Ejection fraction     EF 60-65%, echo, September, 2010 / EF 60-65% echo, April, 2012  . Hypokalemia     Potassium 3.6 before potassium started April, 2012  . Hx of colonoscopy   . Primary osteoarthritis of left knee     Mild  . Chronic foot pain 2009    Resolved with use of orthotics  . Intolerance of drug     Fish oil, bilirubin, nacin  . BPH (benign prostatic hyperplasia)   . Pulmonic stenosis     Mild, echo, April, 2012  . Drug therapy     Tikosyn April, 2012  . TSH (thyroid-stimulating hormone deficiency)   . Hemorrhoids     September, 2012  . Numbness of toes     Slight numbness of one toe, September, 2012  . Hx of CABG   . Shortness of breath     Shortness of breath at rest but not with exercise July, 2013  . Nausea     Nausea around the time of taking potassium, July, 2013, taking potassium at night now, seems better    Past Surgical History  Procedure Date  . Coronary artery bypass graft 2000  . Tonsillectomy     ROS   Patient denies fever, chills, headache, sweats, rash, change in vision, change in hearing, chest pain,  cough,  vomiting, urinary symptoms. All other systems are reviewed and are negative.  PHYSICAL EXAM  Patient looks good. He is oriented to person time and place. Affect is normal. There is no jugulovenous distention. Lungs are clear.  Respiratory effort is nonlabored. Cardiac exam reveals S1 and S2 and a systolic murmur. Abdomen is soft. There is no peripheral edema. There no musculoskeletal deformities. There are no skin rashes.  Filed Vitals:   06/15/12 1048  BP: 122/68  Pulse: 45  Height: 5\' 11"  (1.803 m)  Weight: 195 lb (88.451 kg)  SpO2: 98%   EKG is done today and reviewed by me. He has marked sinus bradycardia. The rate is 37 beats per minute.  ASSESSMENT & PLAN

## 2012-06-15 NOTE — Assessment & Plan Note (Signed)
The patient is not having syncope or presyncope. His heart rate increases to the range of 120 when exercising. He does have some general fatigue. I cannot tell at this is related to resting sinus bradycardia or not. We will continue to have to decide whether he needs a pacemaker or not.

## 2012-06-15 NOTE — Assessment & Plan Note (Signed)
The patient has mild disease of the aortic valve and pulmonic valve. He does not need followup studies at this time.

## 2012-06-15 NOTE — Assessment & Plan Note (Signed)
The patient is having some increase in palpitations. If this is not limiting him.

## 2012-06-15 NOTE — Assessment & Plan Note (Signed)
Coronary disease is stable. His last exercise study was in March, 2011.

## 2012-06-15 NOTE — Assessment & Plan Note (Signed)
Tikosyn continues at 375 mg twice a day. Corrected QT interval today is 428 ms. The QT interval is 546 ms

## 2012-06-15 NOTE — Assessment & Plan Note (Signed)
The patient is maintaining sinus rhythm with marked sinus bradycardia. He is on Tikosyn 375 twice a day. Corrected QT interval today is 428 ms. Previously his QT interval was too long on 500 mg twice a day. I do not know of consideration should be given to trying a higher dose a second time. He says that he's had some increase in the episodes of limited atrial fibrillation. He raised the question as to whether or not an atrial flutter ablation should be considered.

## 2012-06-15 NOTE — Assessment & Plan Note (Signed)
The patient's nausea seems related to potassium. As he takes a potassium in the eating he is feeling better.

## 2012-06-15 NOTE — Assessment & Plan Note (Signed)
He continues on Coumadin therapy with history of paroxysmal atrial fibrillation. Over time we have considered other drugs. For now we continue Coumadin.

## 2012-06-15 NOTE — Assessment & Plan Note (Signed)
At this point I'm not convinced that the shortness of breath represents any significant problem. It is only at rest. I will consider just a followup chest x-ray. There is no sign of heart failure. I feel this is not an anginal equivalent.

## 2012-06-17 ENCOUNTER — Other Ambulatory Visit: Payer: Self-pay

## 2012-06-17 ENCOUNTER — Ambulatory Visit (INDEPENDENT_AMBULATORY_CARE_PROVIDER_SITE_OTHER)
Admission: RE | Admit: 2012-06-17 | Discharge: 2012-06-17 | Disposition: A | Discharge status: Home / Self Care | Payer: 59 | Source: Ambulatory Visit | Attending: Cardiology | Admitting: Cardiology

## 2012-06-17 DIAGNOSIS — R0602 Shortness of breath: Secondary | ICD-10-CM

## 2012-06-17 NOTE — Addendum Note (Signed)
Addended by: Brien Mates R on: 06/17/2012 04:05 PM   Modules accepted: Orders

## 2012-06-22 ENCOUNTER — Telehealth: Payer: Self-pay | Admitting: *Deleted

## 2012-06-22 NOTE — Telephone Encounter (Signed)
CXR results given to pt

## 2012-06-22 NOTE — Telephone Encounter (Signed)
Pt concerned about his chest X-ray results and follow-up. I informed pt that the chest X-ray hasnt been read by the physician yet. Please contact pt asap when the xray is read and follow up is advised.

## 2012-06-22 NOTE — Telephone Encounter (Signed)
N/A.  LMTC. 

## 2012-07-04 ENCOUNTER — Ambulatory Visit (INDEPENDENT_AMBULATORY_CARE_PROVIDER_SITE_OTHER): Payer: 59 | Admitting: Internal Medicine

## 2012-07-04 ENCOUNTER — Other Ambulatory Visit: Payer: Self-pay | Admitting: *Deleted

## 2012-07-04 ENCOUNTER — Encounter: Payer: Self-pay | Admitting: Internal Medicine

## 2012-07-04 VITALS — BP 144/80 | HR 54 | Temp 97.2°F | Ht 71.0 in | Wt 191.4 lb

## 2012-07-04 DIAGNOSIS — I4891 Unspecified atrial fibrillation: Secondary | ICD-10-CM

## 2012-07-04 DIAGNOSIS — M62830 Muscle spasm of back: Secondary | ICD-10-CM

## 2012-07-04 DIAGNOSIS — I4892 Unspecified atrial flutter: Secondary | ICD-10-CM

## 2012-07-04 DIAGNOSIS — M5380 Other specified dorsopathies, site unspecified: Secondary | ICD-10-CM

## 2012-07-04 DIAGNOSIS — R001 Bradycardia, unspecified: Secondary | ICD-10-CM

## 2012-07-04 DIAGNOSIS — Z7901 Long term (current) use of anticoagulants: Secondary | ICD-10-CM

## 2012-07-04 MED ORDER — CYCLOBENZAPRINE HCL 5 MG PO TABS: 5.0000 mg | ORAL_TABLET | Freq: Three times a day (TID) | ORAL | Status: DC | PRN

## 2012-07-04 NOTE — Assessment & Plan Note (Signed)
Normal sinus - chronic asymptomatic brady on meds + chronic anticoag Continue to work with cards as ongoing Reviewed for possible rx interactions prior to new rx for muscle relaxer

## 2012-07-04 NOTE — Progress Notes (Signed)
Subjective:    Patient ID: Russell Griffith, male    DOB: 1946-11-25, 66 y.o.   MRN: [id number]  HPI complains of R shoulder blade pain Onset 5 days ago - constant  ?precipitated bu overuse - [place] 7yo g[place]son [place] Pain exacerbated by activity or sitting/lying down - better with standing No hx same No radiation into R arm or hand - no numbness or weakness Mild nausea but pain unaffected by meal/food intake  Past Medical History  Diagnosis Date  . CAD (coronary artery disease)     Myoview March, 2011, excellent exercise, hypertensive response, no scar or ischemia, EF 61%, mild palpitations peak stress with infrequent PACs and PVCs.  Marland Gul HTN (hypertension)     Controlled at rest, hypertension on treadmill, sensitive to medications  . Hyperlipidemia     Low HDL  . GERD (gastroesophageal reflux disease)     Barrett's esophagus  . Colonic polyp   . A-fib      Admission for Tikosyn load, March 20, 2011, converted after first dose, QTC 440 at the time of discharge. / Office note March 27, 2011, clinically in and out of atrial, QTC 500 ms, Dr.Klein decreased Tikosyn to 375mg  BID.  Atrial fib at rest February 25, 2011 / plan to admit for [facility] therapy  . Palpitations      exercise palpitations, probably A. fib-not VT,Pindolol not tolerated, CAD, bradycardia,Tickosyn needs hospital +/_ side effects, amiodarone & sotalol could need pacer,Multaq less effective not tried, atrial fib ablation could be considered at low risk  . Foot pain     Chronic lateral foot pain-resolved with use of orthotics '09  . Bradycardia   . Aortic stenosis     Mild, echo, April, 2012  . PVC's (premature ventricular contractions)   . Carotid artery disease     Doppler November, 2010, 0-39% bilateral followup 2 years  . Atrial flutter     Status post ablation  . Warfarin anticoagulation     Coumadin for atrial fib  . Aortic insufficiency      Mild, echo, 2010 / mild/moderate, echo, 2012  .  Elevated bilirubin     Mild chronic elevation, 2.0 January, 2011 stable  . BPH (benign prostatic hyperplasia)   . Drug intolerance     Pindolol before exercise not tolerated  . Ejection fraction     EF 60-65%, echo, September, 2010 / EF 60-65% echo, April, 2012  . Hypokalemia     Potassium 3.6 before potassium started April, 2012  . Hx of colonoscopy   . Primary osteoarthritis of left knee     Mild  . Chronic foot pain 2009    Resolved with use of orthotics  . Intolerance of drug     Fish oil, bilirubin, nacin  . BPH (benign prostatic hyperplasia)   . Pulmonic stenosis     Mild, echo, April, 2012  . Drug therapy     Tikosyn April, 2012  . TSH (thyroid-stimulating hormone deficiency)   . Hemorrhoids     September, 2012  . Numbness of toes     Slight numbness of one toe, September, 2012  . Hx of CABG     Review of Systems  HENT: Negative for ear pain. Neck stiffness: tight on r side x 3 days.   Musculoskeletal: Negative for myalgias, back pain and joint swelling.  Neurological: Negative for headaches.       Objective:   Physical Exam BP 144/80  Pulse 54  Temp 97.2 F (36.2 C) (Oral)  Ht 5\' 11"  (1.803 m)  Wt 191 lb 6.4 oz (86.818 kg)  BMI 26.69 kg/m2  SpO2 97% Wt Readings from Last 3 Encounters:  07/04/12 191 lb 6.4 oz (86.818 kg)  06/15/12 195 lb (88.451 kg)  05/23/12 198 lb (89.812 kg)   Constitutional:  He appears well-developed and well-nourished. No distress.  Neck: Normal range of motion. Neck supple. No JVD present. No thyromegaly present.  Musculoskeletal: spasm along inner R scapula - but FROM R shoulder and neck - Neurological: he is alert and oriented to person, place, and time. No cranial nerve deficit. Coordination normal.  Skin: Skin is warm and dry.  No erythema or ulceration.  Psychiatric: he has a normal mood and affect. behavior is normal. Judgment and thought content normal.   Lab Results  Component Value Date   WBC 6.7 04/04/2012   HGB 14.9  04/04/2012   HCT 43.8 04/04/2012   PLT 159.0 04/04/2012   GLUCOSE 100* 04/04/2012   CHOL 119 04/04/2012   TRIG 46.0 04/04/2012   HDL 31.80* 04/04/2012   LDLCALC 78 04/04/2012   ALT 26 04/04/2012   ALT 26 04/04/2012   AST 30 04/04/2012   AST 30 04/04/2012   NA 142 04/04/2012   K 4.5 04/04/2012   CL 106 04/04/2012   CREATININE 1.0 04/04/2012   BUN 21 04/04/2012   CO2 27 04/04/2012   TSH 4.45 04/04/2012   PSA 1.09 04/04/2012   INR 2.7 06/15/2012       Assessment & Plan:   R scapular pain - muscle spasm -  Suspect cervical radiculopathy but no muscle weakness or radiating pain  tx conservative care with muscle relaxer qhs and tylenol x 72h Demonstrated PT neck exercises "turtle and Js" - pt to call if formal PT refer desired or if pain worse

## 2012-07-04 NOTE — Patient Instructions (Signed)
It was good to see you today. Use Flexeril muscle relaxant as needed for muscle spasm pain -Your prescription(s) have been submitted to your pharmacy. Please take as directed and contact our office if you believe you are having problem(s) with the medication(s). Tylenol 1 gram 2x/day x 72h, then as needed in addition to Flexeril Neck exercises as discussed - call if worse or unimproved

## 2012-07-07 ENCOUNTER — Encounter (INDEPENDENT_AMBULATORY_CARE_PROVIDER_SITE_OTHER): Payer: 59

## 2012-07-07 DIAGNOSIS — I4891 Unspecified atrial fibrillation: Secondary | ICD-10-CM

## 2012-07-11 ENCOUNTER — Encounter: Payer: Self-pay | Admitting: Cardiology

## 2012-07-11 NOTE — Progress Notes (Signed)
   The patient returned his Holter monitor today. He was scanned immediately and noted that he had several pauses. One was 5.9 seconds. With this in mind it appears that he needs a pacemaker. I have spoken with Dr. Ladona Ridgel who will be seeing the patient soon in the office concerning this issue. I called and notified the patient also.

## 2012-07-12 ENCOUNTER — Encounter: Payer: Self-pay | Admitting: Cardiology

## 2012-07-12 DIAGNOSIS — J8410 Pulmonary fibrosis, unspecified: Secondary | ICD-10-CM | POA: Insufficient documentation

## 2012-07-13 ENCOUNTER — Encounter: Payer: Self-pay | Admitting: Cardiology

## 2012-07-13 ENCOUNTER — Encounter: Payer: Self-pay | Admitting: Internal Medicine

## 2012-07-13 ENCOUNTER — Encounter (HOSPITAL_COMMUNITY): Payer: Self-pay | Admitting: Pharmacy Technician

## 2012-07-13 ENCOUNTER — Other Ambulatory Visit (HOSPITAL_COMMUNITY): Payer: Self-pay | Admitting: Cardiology

## 2012-07-13 ENCOUNTER — Ambulatory Visit (INDEPENDENT_AMBULATORY_CARE_PROVIDER_SITE_OTHER): Payer: 59 | Admitting: Internal Medicine

## 2012-07-13 ENCOUNTER — Encounter: Payer: Self-pay | Admitting: *Deleted

## 2012-07-13 ENCOUNTER — Ambulatory Visit (HOSPITAL_COMMUNITY): Payer: 59 | Attending: Cardiovascular Disease

## 2012-07-13 VITALS — BP 166/74 | HR 47 | Ht 71.0 in | Wt 194.4 lb

## 2012-07-13 DIAGNOSIS — I10 Essential (primary) hypertension: Secondary | ICD-10-CM | POA: Insufficient documentation

## 2012-07-13 DIAGNOSIS — I079 Rheumatic tricuspid valve disease, unspecified: Secondary | ICD-10-CM | POA: Insufficient documentation

## 2012-07-13 DIAGNOSIS — Z01810 Encounter for preprocedural cardiovascular examination: Secondary | ICD-10-CM

## 2012-07-13 DIAGNOSIS — R001 Bradycardia, unspecified: Secondary | ICD-10-CM

## 2012-07-13 DIAGNOSIS — I495 Sick sinus syndrome: Secondary | ICD-10-CM | POA: Insufficient documentation

## 2012-07-13 DIAGNOSIS — R0602 Shortness of breath: Secondary | ICD-10-CM

## 2012-07-13 DIAGNOSIS — I4891 Unspecified atrial fibrillation: Secondary | ICD-10-CM | POA: Insufficient documentation

## 2012-07-13 DIAGNOSIS — E785 Hyperlipidemia, unspecified: Secondary | ICD-10-CM | POA: Insufficient documentation

## 2012-07-13 DIAGNOSIS — I080 Rheumatic disorders of both mitral and aortic valves: Secondary | ICD-10-CM | POA: Insufficient documentation

## 2012-07-13 DIAGNOSIS — I498 Other specified cardiac arrhythmias: Secondary | ICD-10-CM | POA: Insufficient documentation

## 2012-07-13 DIAGNOSIS — R0609 Other forms of dyspnea: Secondary | ICD-10-CM | POA: Insufficient documentation

## 2012-07-13 DIAGNOSIS — R0989 Other specified symptoms and signs involving the circulatory and respiratory systems: Secondary | ICD-10-CM | POA: Insufficient documentation

## 2012-07-13 LAB — BASIC METABOLIC PANEL
BUN: 23 mg/dL (ref 6–23)
Calcium: 9.2 mg/dL (ref 8.4–10.5)
Creat: 1.12 mg/dL (ref 0.50–1.35)
Potassium: 4.2 mEq/L (ref 3.5–5.3)

## 2012-07-13 LAB — CBC WITH DIFFERENTIAL/PLATELET
Basophils Absolute: 0 10*3/uL (ref 0.0–0.1)
Eosinophils Absolute: 0.1 10*3/uL (ref 0.0–0.7)
Lymphs Abs: 2 10*3/uL (ref 0.7–4.0)
MCH: 29 pg (ref 26.0–34.0)
Neutrophils Relative %: 52 % (ref 43–77)
Platelets: 197 10*3/uL (ref 150–400)
RBC: 5.41 MIL/uL (ref 4.22–5.81)
RDW: 14.3 % (ref 11.5–15.5)
WBC: 6.8 10*3/uL (ref 4.0–10.5)

## 2012-07-13 LAB — PROTIME-INR: Prothrombin Time: 22.9 seconds — ABNORMAL HIGH (ref 11.6–15.2)

## 2012-07-13 NOTE — Assessment & Plan Note (Signed)
As above.

## 2012-07-13 NOTE — Patient Instructions (Addendum)
Your physician recommends that you continue on your current medications as directed. Please refer to the Current Medication list given to you today. Your physician has recommended that you have a pacemaker inserted. A pacemaker is a small device that is placed under the skin of your chest or abdomen to help control abnormal heart rhythms. This device uses electrical pulses to prompt the heart to beat at a normal rate. Pacemakers are used to treat heart rhythms that are too slow. Wire (leads) are attached to the pacemaker that goes into the chambers of you heart. This is done in the hospital and usually requires and overnight stay. Please see the instruction sheet given to you today for more information. DX BRADYCARDIA Your physician recommends that you return for lab work in: BMET CBC INR TODAY OR TOMORROW  DX V72.81  BRADYCARDIA

## 2012-07-13 NOTE — Progress Notes (Signed)
   Talked to DRs Graciela Husbands and Pelican Marsh last night. Spoke with patient at length. He will see Dr. Graciela Husbands today 3PM  Further review of chart:  Last nuclear  March/2011 Last echo      April/2012........Marland KitchenWith current decision making, I have scheduled echo for today at 4PM after patient sees Dr. Graciela Husbands.  Last carotid doppler    2010............very mild disease. I will arrange f/u study later.

## 2012-07-13 NOTE — Progress Notes (Signed)
Echocardiogram performed.  

## 2012-07-13 NOTE — Assessment & Plan Note (Signed)
Patient is having recurrent frequent episodes of atrial fibrillation detected by his monitor. Of more significant note however apart from the rapid rates, RV posttermination pauses recorded up to  6 seconds. Multiple episodes were seen with pauses somewhat shorter.  Sinus node dysfunction and bradycardia has been a long-term concern. During exercise, he is able to accomplish a peak heart rate of 110-15 which is clearly chronotropic incompetent for managing his age although his exercise capacity remains good.  Posttermination pauses are of concern for 2 reasons, the first related to the length is potential for syncope which is associated with pauses of 7-8 seconds. The second is related to accentuation of pro arrhythmic risk associated with QT prolongation associated with the preceding pause. This is theoretical my mind but of some concern.  I don't think that there is anything we can do for his atrial fibrillation including ablation which is sufficiently reduces the likelihood of recurrent atrial fibrillation that would mitigate the risk of post termination pausing. Hence I think the first order of activity is to proceed with pacemaker implantation. I've discussed this extensively with the family.  The benefits and risks were reviewed including but not limited to death,  perforation, infection, lead dislodgement and device malfunction.  The patient understands agrees and is willing to proceed.  We also discussed the role of the MRI compatible device. They would like not to proceed with that. Given his vigorous exercising, I think a blended sensor device might be particularly advantageous. We will use a Research officer, political party   The other issues that we discussed related to atrial fibrillation including the role of anticoagulation and the role of NOACs. He has a CHADS-VASc score of one his long-term anticoagulation is not clearly recommended at this point although his CHADS-VASc score inclined to next year.  I have suggested that perhaps apixoban might be a reasonable alternative as the factor X drugs have been used for coronary disease and could be considered as appropriate single agent therapy for both his atrial fibrillation as well as his coronary disease although we don't have data as relates to the latter.  We discussed treatment options. He is not a candidate for a 1C agent because of his coronary disease. I would not like to use amiodarone long-term/suggested that he consider catheter ablation as a long-term option and to discuss with his friends and colleagues would be my like to go for referral. I've also mentioned to him that sleep apnea attenuates the benefit of the procedure and as such many centers are undertaking sleep study prior to ablation selective right therapy for this prior to undertaking invasive procedure. He is agreeable. He'll need an outpatient sleep study at. [place].

## 2012-07-13 NOTE — Progress Notes (Signed)
ELECTROPHYSIOLOGY CONSULT NOTE  Patient ID: Russell Griffith, MRN: [id number], DOB/AGE: November 28, 1946 66 y.o. Admit date: (Not on file) Date of Consult: 07/13/2012  Primary Physician: Illene Regulus, MD Primary Cardiologist: jk  Chief Complaint: pauses  HPI Russell Griffith is a 66 y.o. male  seen at the request of Dr. Myrtis Ser because of significant pauses noted on the monitor.  He is a history of atrial fibrillation. He was treated with Tikosyn. It was notable that he had late QT prolongation and has been treated now with 375 mcg twice daily with some recent increase in the frequency of his atrial fibrillation  He has a history of bradycardia. With significant exercise his heart rate gets up to about 110. He has noted with exercise however, worse of heart rates in the 180s or so with irregularity. It is his impression that over recent months the frequency of his atrial fibrillation has increased. He has had no significant episodes of lightheadedness. He has had more fatigue. While he does not snore he does find himself falling asleep in the early evenings while watching television.  He has a history of coronary artery disease with prior bypass grafting echo is pending this afternoon. He does not have orthopnea nocturnal dyspnea or peripheral edema.   Past Medical History  Diagnosis Date  . CAD (coronary artery disease)     Myoview March, 2011, excellent exercise, hypertensive response, no scar or ischemia, EF 61%, mild palpitations peak stress with infrequent PACs and PVCs.  Marland Aikey HTN (hypertension)     Controlled at rest, hypertension on treadmill, sensitive to medications  . Hyperlipidemia     Low HDL  . GERD (gastroesophageal reflux disease)     Barrett's esophagus  . Colonic polyp   . A-fib      Admission for Tikosyn load, March 20, 2011, converted after first dose, QTC 440 at the time of discharge. / Office note March 27, 2011, clinically in and out of atrial, QTC 500 ms, Dr.Klein decreased  Tikosyn to 375mg  BID.  Atrial fib at rest February 25, 2011 / plan to admit for [facility] therapy  . Palpitations      exercise palpitations, probably A. fib-not VT,Pindolol not tolerated, CAD, bradycardia,Tickosyn needs hospital +/_ side effects, amiodarone & sotalol could need pacer,Multaq less effective not tried, atrial fib ablation could be considered at low risk  . Foot pain     Chronic lateral foot pain-resolved with use of orthotics '09  . Bradycardia   . Aortic stenosis     Mild, echo, April, 2012  . PVC's (premature ventricular contractions)   . Carotid artery disease     Doppler November, 2010, 0-39% bilateral followup 2 years  . Atrial flutter     Status post ablation  . Warfarin anticoagulation     Coumadin for atrial fib  . Aortic insufficiency      Mild, echo, 2010 / mild/moderate, echo, 2012  . Elevated bilirubin     Mild chronic elevation, 2.0 January, 2011 stable  . BPH (benign prostatic hyperplasia)   . Drug intolerance     Pindolol before exercise not tolerated  . Ejection fraction     EF 60-65%, echo, September, 2010 / EF 60-65% echo, April, 2012  . Hypokalemia     Potassium 3.6 before potassium started April, 2012  . Hx of colonoscopy   . Primary osteoarthritis of left knee     Mild  . Chronic foot pain 2009    Resolved with use of  orthotics  . Intolerance of drug     Fish oil, bilirubin, nacin  . BPH (benign prostatic hyperplasia)   . Pulmonic stenosis     Mild, echo, April, 2012  . Drug therapy     Tikosyn April, 2012  . TSH (thyroid-stimulating hormone deficiency)   . Hemorrhoids     September, 2012  . Numbness of toes     Slight numbness of one toe, September, 2012  . Hx of CABG   . Lung granuloma     Left  lung chest x-ray July, 2013      Surgical History:  Past Surgical History  Procedure Date  . Coronary artery bypass graft 2000  . Tonsillectomy      Home Meds: Prior to Admission medications   Medication Sig Start Date End Date Taking?  Authorizing Provider  aspirin 81 MG tablet Take 81 mg by mouth daily.     Yes Historical Provider, MD  atorvastatin (LIPITOR) 20 MG tablet Take 1 tablet (20 mg total) by mouth daily. 05/09/12  Yes Luis Abed, MD  Calcium & Magnesium Carbonates (MYLANTA PO) Take 10 mg by mouth daily.    Yes Historical Provider, MD  cyclobenzaprine (FLEXERIL) 5 MG tablet Take 1 tablet (5 mg total) by mouth every 8 (eight) hours as needed for muscle spasms. 07/04/12 07/04/13 Yes Newt Lukes, MD  dofetilide (TIKOSYN) 250 MCG capsule  01/18/12  Yes Duke Salvia, MD  esomeprazole (NEXIUM) 40 MG capsule Take 1 capsule (40 mg total) by mouth daily before breakfast. 09/23/11  Yes Rachael Fee, MD  Multiple Vitamin (MULTIVITAMIN) capsule Take 1 capsule by mouth daily.     Yes Historical Provider, MD  potassium chloride (MICRO-K) 10 MEQ CR capsule Take 10 mEq by mouth daily.   Yes Historical Provider, MD  ramipril (ALTACE) 2.5 MG capsule Take 1 capsule (2.5 mg total) by mouth daily. 04/18/12  Yes Luis Abed, MD  valACYclovir (VALTREX) 1000 MG tablet Take 1,000 mg by mouth as needed.    Yes Historical Provider, MD  warfarin (COUMADIN) 5 MG tablet Take as directed by [facility] 03/14/12  Yes Luis Abed, MD    *Allergies:  Allergies  Allergen Reactions  . Sulfonamide Derivatives     History   Social History  . Marital Status: Married    Spouse Name: N/A    Number of Children: 2  . Years of Education: 18   Occupational History  . [employer]   Social History Main Topics  . Smoking status: Never Smoker   . Smokeless tobacco: Never Used  . Alcohol Use: Yes     2 glasses of wine per day  . Drug Use: No  . Sexually Active: Yes -- Male partner(s)   Other Topics Concern  . Not on file   Social History Narrative   [employer] [place], .  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  2 daughters-'75, '79; 1  grandchild; step-daughter and step grandson.  Regular exercise-yes, runs 1.5-2 mi 4x/wk, also elipticalPatient signed a Designated Party Release to allow his wife, Rodert Hinch, to have access to his medical records/ information.     Family History  Problem Relation Age of Onset  . Hyperlipidemia    . Hypertension    . Coronary artery disease Other 72  . Diabetes Other   . Colon cancer Neg Hx      ROS:  Please  see the history of present illness.  Mild arthritis; glasses; All other systems reviewed and negative.    Physical Exam:  Blood pressure 166/74, pulse 47, height 5\' 11"  (1.803 m), weight 194 lb 6.4 oz (88.179 kg). General: Well developed, well nourished male in no acute distress. Head: Normocephalic, atraumatic, sclera non-icteric, no xanthomas, nares are without discharge. Lymph Nodes:  none Back: without scoliosis/kyphosis, no CVA tendersness Neck: Negative for carotid bruits. JVD not elevated. Lungs: Clear bilaterally to auscultation without wheezes, rales, or rhonchi. Breathing is unlabored. Heart: RRR with S1 S2. No murmur , rubs, or gallops appreciated. Abdomen: Soft, non-tender, non-distended with normoactive bowel sounds. No hepatomegaly. No rebound/guarding. No obvious abdominal masses. Msk:  Strength and tone appear normal for age. Extremities: No clubbing or cyanosis. No edema.  Distal pedal pulses are 2+ and equal bilaterally. Skin: Warm and Dry Neuro: Alert and oriented X 3. CN III-XII intact Grossly normal sensory and motor function . Psych:  Responds to questions appropriately with a normal affect.      Labs: Cardiac Enzymes No results found for this basename: CKTOTAL:4,CKMB:4,TROPONINI:4 in the last 72 hours CBC Lab Results  Component Value Date   WBC 6.7 04/04/2012   HGB 14.9 04/04/2012   HCT 43.8 04/04/2012   MCV 88.3 04/04/2012   PLT 159.0 04/04/2012   PROTIME: No results found for this basename: LABPROT:3,INR:3 in the last 72 hours Chemistry No results  found for this basename: NA,K,CL,CO2,BUN,CREATININE,CALCIUM,LABALBU,PROT,BILITOT,ALKPHOS,ALT,AST,GLUCOSE in the last 168 hours Lipids Lab Results  Component Value Date   CHOL 119 04/04/2012   HDL 31.80* 04/04/2012   LDLCALC 78 04/04/2012   TRIG 46.0 04/04/2012   BNP No results found for this basename: probnp   Miscellaneous No results found for this basename: DDIMER    Radiology/Studies:  Dg Chest 2 View  06/17/2012  *RADIOLOGY REPORT*  Clinical Data: Shortness of breath.  Nonsmoker.  CHEST - 2 VIEW  Comparison: 03/20/2011.  Findings: Post CABG.  Cardiomegaly.  Mildly tortuous aorta. Central pulmonary vascular prominence.  Granuloma left upper lung.  IMPRESSION: No significant change.  Please see above.  Original Report Authenticated By: Fuller Canada, M.D.    EKG:  July 2013 demonstrated sinus rhythm at 37 intervals 28/08/44 Holter monitor demonstrates frequent episodes of an atrial fibrillation with posttermination pauses. Atrial fibrillation rates are in the 160-80 range.   Assessment and Plan: * Sherryl Manges

## 2012-07-14 ENCOUNTER — Institutional Professional Consult (permissible substitution): Payer: 59 | Admitting: Internal Medicine

## 2012-07-14 ENCOUNTER — Ambulatory Visit (INDEPENDENT_AMBULATORY_CARE_PROVIDER_SITE_OTHER): Payer: 59 | Admitting: Sports Medicine

## 2012-07-14 ENCOUNTER — Encounter: Payer: Self-pay | Admitting: Sports Medicine

## 2012-07-14 VITALS — BP 153/71 | HR 45 | Ht 71.0 in | Wt 194.0 lb

## 2012-07-14 DIAGNOSIS — M25569 Pain in unspecified knee: Secondary | ICD-10-CM

## 2012-07-14 DIAGNOSIS — M1710 Unilateral primary osteoarthritis, unspecified knee: Secondary | ICD-10-CM

## 2012-07-14 DIAGNOSIS — M722 Plantar fascial fibromatosis: Secondary | ICD-10-CM

## 2012-07-14 NOTE — Progress Notes (Signed)
Subjective:   Patient ID: Russell Griffith, male DOB: [birth year], 66 y.o. MRN: [id number]  HPI   Patient continues to have right plantar pain.  Has been compliant with exercises / stretching and use of sleeve. Icing with exercise Continue to have pain under heel and mid foot Worse in morning No night time pain No swelling  Left Knee pain - persistent - has known meniscus issues from previous history - no catching or locking - having more anterior knee pain around superior patella - mild swelling noted - not doing any particular intervention at this time - activity is mostly elliptical - pain worse with extension   Objective:   Physical Exam   No acute distress   Foot: Bilat moderate arch  Good post tib function  Tenderness to the insertion at the medial plantar fascia to the calcaneus  No swelling in ankle noted  Left Knee Mild swelling superior to patella.  Moderate patellar grind.  Pain only on superior portion of patella and with grind testing.  LCL, MCL,ACL, PCL intact Does have positive Mcmurray's with palpable click.  No joint line tenderness.  Has good quad strength.    MSK ultrasound  Right plantar fascia is thick increased from 0.57 to 0.69 cm There is a calcified spur that appears to be worse with penetration into the actual fascial membrane  More calcific change around this  Left knee:  Superior patella - multiple small spurs along the patella infiltration into the quadriceps  tendon with hypoechoic findings suggestive of fluid collection and inflammation.   Assessment & Plan:      Plantar fasciitis of right foot  Continue orthotics and workout shoes  R. strap for the right  Ice at night  Keep up exercises and stretches  Given not improved with conservative therapy and U/S evidence of calcification within the plantar fascia would recommend a steroid injection Patient having pace maker placed tomorrow so will hold until 10 days after procedure to allow for  proper recovery  Left knee pain Patient has signs of multiple spurs on anterior superior patella with some associated swelling.   Continue conservative therapy with ice, cross training Added sleeve for compression Plan to f/u in 2 months

## 2012-07-14 NOTE — Assessment & Plan Note (Signed)
I think this is worsened to the degree that he would benefit from a steroid injection probably with some needling of the calcified area around the spur  Working in to do that in about 10 days once he has resolved issues relating to his pacemaker placement

## 2012-07-14 NOTE — Assessment & Plan Note (Signed)
Now having some persistent pain in the left knee along the superior patellar border  On scanning he has a large calcified spur that does seem to come into the soft tissue below the quadriceps tendon There is swelling around the specific area  We'll try compression sleeve to see if that lessens the symptoms Consider injection into the area if it does not

## 2012-07-15 ENCOUNTER — Encounter (HOSPITAL_COMMUNITY): Payer: Self-pay | Admitting: General Practice

## 2012-07-15 ENCOUNTER — Encounter (HOSPITAL_COMMUNITY)
Admission: RE | Disposition: A | Discharge status: Home / Self Care | Payer: Self-pay | Source: Ambulatory Visit | Attending: Internal Medicine

## 2012-07-15 ENCOUNTER — Ambulatory Visit (HOSPITAL_COMMUNITY)
Admission: RE | Admit: 2012-07-15 | Discharge: 2012-07-16 | Disposition: A | Discharge status: Home / Self Care | Payer: 59 | Source: Ambulatory Visit | Attending: Internal Medicine | Admitting: Internal Medicine

## 2012-07-15 DIAGNOSIS — I351 Nonrheumatic aortic (valve) insufficiency: Secondary | ICD-10-CM

## 2012-07-15 DIAGNOSIS — I10 Essential (primary) hypertension: Secondary | ICD-10-CM

## 2012-07-15 DIAGNOSIS — M79609 Pain in unspecified limb: Secondary | ICD-10-CM

## 2012-07-15 DIAGNOSIS — I498 Other specified cardiac arrhythmias: Secondary | ICD-10-CM

## 2012-07-15 DIAGNOSIS — M1710 Unilateral primary osteoarthritis, unspecified knee: Secondary | ICD-10-CM

## 2012-07-15 DIAGNOSIS — Z860100 Personal history of colon polyps, unspecified: Secondary | ICD-10-CM

## 2012-07-15 DIAGNOSIS — K2940 Chronic atrophic gastritis without bleeding: Secondary | ICD-10-CM

## 2012-07-15 DIAGNOSIS — R609 Edema, unspecified: Secondary | ICD-10-CM

## 2012-07-15 DIAGNOSIS — R002 Palpitations: Secondary | ICD-10-CM

## 2012-07-15 DIAGNOSIS — I4891 Unspecified atrial fibrillation: Secondary | ICD-10-CM

## 2012-07-15 DIAGNOSIS — Z7901 Long term (current) use of anticoagulants: Secondary | ICD-10-CM

## 2012-07-15 DIAGNOSIS — Z79899 Other long term (current) drug therapy: Secondary | ICD-10-CM

## 2012-07-15 DIAGNOSIS — Z950 Presence of cardiac pacemaker: Secondary | ICD-10-CM

## 2012-07-15 DIAGNOSIS — I495 Sick sinus syndrome: Secondary | ICD-10-CM

## 2012-07-15 DIAGNOSIS — M719 Bursopathy, unspecified: Secondary | ICD-10-CM

## 2012-07-15 DIAGNOSIS — M25569 Pain in unspecified knee: Secondary | ICD-10-CM

## 2012-07-15 DIAGNOSIS — I2510 Atherosclerotic heart disease of native coronary artery without angina pectoris: Secondary | ICD-10-CM

## 2012-07-15 DIAGNOSIS — K649 Unspecified hemorrhoids: Secondary | ICD-10-CM

## 2012-07-15 DIAGNOSIS — I4949 Other premature depolarization: Secondary | ICD-10-CM

## 2012-07-15 DIAGNOSIS — Z87448 Personal history of other diseases of urinary system: Secondary | ICD-10-CM

## 2012-07-15 DIAGNOSIS — E876 Hypokalemia: Secondary | ICD-10-CM

## 2012-07-15 DIAGNOSIS — M23302 Other meniscus derangements, unspecified lateral meniscus, unspecified knee: Secondary | ICD-10-CM

## 2012-07-15 DIAGNOSIS — R9430 Abnormal result of cardiovascular function study, unspecified: Secondary | ICD-10-CM

## 2012-07-15 DIAGNOSIS — K209 Esophagitis, unspecified without bleeding: Secondary | ICD-10-CM

## 2012-07-15 DIAGNOSIS — E785 Hyperlipidemia, unspecified: Secondary | ICD-10-CM

## 2012-07-15 DIAGNOSIS — K5730 Diverticulosis of large intestine without perforation or abscess without bleeding: Secondary | ICD-10-CM

## 2012-07-15 DIAGNOSIS — K2270 Barrett's esophagus without dysplasia: Secondary | ICD-10-CM

## 2012-07-15 DIAGNOSIS — I350 Nonrheumatic aortic (valve) stenosis: Secondary | ICD-10-CM

## 2012-07-15 DIAGNOSIS — R200 Anesthesia of skin: Secondary | ICD-10-CM

## 2012-07-15 DIAGNOSIS — Q663 Other congenital varus deformities of feet, unspecified foot: Secondary | ICD-10-CM

## 2012-07-15 DIAGNOSIS — K2980 Duodenitis without bleeding: Secondary | ICD-10-CM

## 2012-07-15 DIAGNOSIS — R001 Bradycardia, unspecified: Secondary | ICD-10-CM

## 2012-07-15 DIAGNOSIS — M67919 Unspecified disorder of synovium and tendon, unspecified shoulder: Secondary | ICD-10-CM

## 2012-07-15 DIAGNOSIS — J8410 Pulmonary fibrosis, unspecified: Secondary | ICD-10-CM

## 2012-07-15 DIAGNOSIS — F341 Dysthymic disorder: Secondary | ICD-10-CM

## 2012-07-15 DIAGNOSIS — R110 Nausea: Secondary | ICD-10-CM

## 2012-07-15 DIAGNOSIS — I493 Ventricular premature depolarization: Secondary | ICD-10-CM

## 2012-07-15 DIAGNOSIS — M722 Plantar fascial fibromatosis: Secondary | ICD-10-CM

## 2012-07-15 DIAGNOSIS — Z0000 Encounter for general adult medical examination without abnormal findings: Secondary | ICD-10-CM

## 2012-07-15 DIAGNOSIS — N401 Enlarged prostate with lower urinary tract symptoms: Secondary | ICD-10-CM

## 2012-07-15 DIAGNOSIS — K219 Gastro-esophageal reflux disease without esophagitis: Secondary | ICD-10-CM

## 2012-07-15 DIAGNOSIS — F411 Generalized anxiety disorder: Secondary | ICD-10-CM

## 2012-07-15 DIAGNOSIS — K449 Diaphragmatic hernia without obstruction or gangrene: Secondary | ICD-10-CM

## 2012-07-15 DIAGNOSIS — I4581 Long QT syndrome: Secondary | ICD-10-CM

## 2012-07-15 DIAGNOSIS — D126 Benign neoplasm of colon, unspecified: Secondary | ICD-10-CM

## 2012-07-15 DIAGNOSIS — IMO0002 Reserved for concepts with insufficient information to code with codable children: Secondary | ICD-10-CM

## 2012-07-15 DIAGNOSIS — R0602 Shortness of breath: Secondary | ICD-10-CM

## 2012-07-15 DIAGNOSIS — R17 Unspecified jaundice: Secondary | ICD-10-CM

## 2012-07-15 DIAGNOSIS — Z951 Presence of aortocoronary bypass graft: Secondary | ICD-10-CM

## 2012-07-15 DIAGNOSIS — E079 Disorder of thyroid, unspecified: Secondary | ICD-10-CM

## 2012-07-15 DIAGNOSIS — N138 Other obstructive and reflux uropathy: Secondary | ICD-10-CM

## 2012-07-15 DIAGNOSIS — I779 Disorder of arteries and arterioles, unspecified: Secondary | ICD-10-CM

## 2012-07-15 DIAGNOSIS — Z789 Other specified health status: Secondary | ICD-10-CM

## 2012-07-15 DIAGNOSIS — I370 Nonrheumatic pulmonary valve stenosis: Secondary | ICD-10-CM

## 2012-07-15 DIAGNOSIS — K222 Esophageal obstruction: Secondary | ICD-10-CM

## 2012-07-15 HISTORY — PX: PERMANENT PACEMAKER INSERTION: SHX5480

## 2012-07-15 HISTORY — DX: Cardiac murmur, unspecified: R01.1

## 2012-07-15 LAB — PROTIME-INR
INR: 1.53 — ABNORMAL HIGH (ref 0.00–1.49)
Prothrombin Time: 18.7 seconds — ABNORMAL HIGH (ref 11.6–15.2)

## 2012-07-15 LAB — SURGICAL PCR SCREEN
MRSA, PCR: NEGATIVE
Staphylococcus aureus: NEGATIVE

## 2012-07-15 SURGERY — PERMANENT PACEMAKER INSERTION
Anesthesia: LOCAL

## 2012-07-15 MED ORDER — SODIUM CHLORIDE 0.9 % IJ SOLN: 3.0000 mL | Freq: Two times a day (BID) | INTRAMUSCULAR | Status: DC

## 2012-07-15 MED ORDER — SODIUM CHLORIDE 0.45 % IV SOLN
INTRAVENOUS | Status: DC
  Administered 2012-07-15: 1000 mL via INTRAVENOUS

## 2012-07-15 MED ORDER — SODIUM CHLORIDE 0.9 % IJ SOLN: 3.0000 mL | INTRAMUSCULAR | Status: DC | PRN

## 2012-07-15 MED ORDER — MUPIROCIN 2 % EX OINT
TOPICAL_OINTMENT | CUTANEOUS | Status: AC
  Filled 2012-07-15: qty 22

## 2012-07-15 MED ORDER — MIDAZOLAM HCL 5 MG/5ML IJ SOLN
INTRAMUSCULAR | Status: AC
  Filled 2012-07-15: qty 5

## 2012-07-15 MED ORDER — DOFETILIDE 250 MCG PO CAPS
250.0000 ug | ORAL_CAPSULE | Freq: Two times a day (BID) | ORAL | Status: DC
  Filled 2012-07-15 (×2): qty 1

## 2012-07-15 MED ORDER — ADULT MULTIVITAMIN W/MINERALS CH
1.0000 | ORAL_TABLET | Freq: Every day | ORAL | Status: DC
  Filled 2012-07-15 (×2): qty 1

## 2012-07-15 MED ORDER — CHLORHEXIDINE GLUCONATE 4 % EX LIQD
60.0000 mL | Freq: Once | CUTANEOUS | Status: DC
  Filled 2012-07-15: qty 60

## 2012-07-15 MED ORDER — LIDOCAINE HCL (PF) 1 % IJ SOLN
INTRAMUSCULAR | Status: AC
  Filled 2012-07-15: qty 60

## 2012-07-15 MED ORDER — DOFETILIDE 250 MCG PO CAPS
375.0000 ug | ORAL_CAPSULE | Freq: Two times a day (BID) | ORAL | Status: DC
  Administered 2012-07-15: 18:00:00 375 ug via ORAL
  Filled 2012-07-15 (×4): qty 1

## 2012-07-15 MED ORDER — SODIUM CHLORIDE 0.9 % IV SOLN: 250.0000 mL | INTRAVENOUS | Status: DC

## 2012-07-15 MED ORDER — CEFAZOLIN SODIUM 1-5 GM-% IV SOLN
1.0000 g | Freq: Four times a day (QID) | INTRAVENOUS | Status: AC
  Administered 2012-07-15 – 2012-07-16 (×3): 1 g via INTRAVENOUS
  Filled 2012-07-15 (×3): qty 50

## 2012-07-15 MED ORDER — DILTIAZEM HCL ER COATED BEADS 120 MG PO CP24: 120.0000 mg | ORAL_CAPSULE | Freq: Every day | ORAL | Status: DC

## 2012-07-15 MED ORDER — RAMIPRIL 2.5 MG PO CAPS
2.5000 mg | ORAL_CAPSULE | Freq: Every day | ORAL | Status: DC
  Administered 2012-07-15: 19:00:00 2.5 mg via ORAL
  Filled 2012-07-15 (×2): qty 1

## 2012-07-15 MED ORDER — HEPARIN SODIUM (PORCINE) 1000 UNIT/ML IJ SOLN
INTRAMUSCULAR | Status: AC
  Filled 2012-07-15: qty 1

## 2012-07-15 MED ORDER — SODIUM CHLORIDE 0.9 % IR SOLN
80.0000 mg | Status: DC
  Filled 2012-07-15 (×3): qty 2

## 2012-07-15 MED ORDER — ONDANSETRON HCL 4 MG/2ML IJ SOLN: 4.0000 mg | Freq: Four times a day (QID) | INTRAMUSCULAR | Status: DC | PRN

## 2012-07-15 MED ORDER — YOU HAVE A PACEMAKER BOOK
Freq: Once | Status: DC
  Filled 2012-07-15: qty 1

## 2012-07-15 MED ORDER — ACETAMINOPHEN 325 MG PO TABS
325.0000 mg | ORAL_TABLET | ORAL | Status: DC | PRN
  Administered 2012-07-15 – 2012-07-16 (×2): 650 mg via ORAL
  Filled 2012-07-15 (×2): qty 2

## 2012-07-15 MED ORDER — CEFAZOLIN SODIUM-DEXTROSE 2-3 GM-% IV SOLR
2.0000 g | INTRAVENOUS | Status: DC
  Filled 2012-07-15 (×2): qty 50

## 2012-07-15 MED ORDER — SODIUM CHLORIDE 0.9 % IV SOLN
INTRAVENOUS | Status: AC
  Administered 2012-07-15: 16:00:00 via INTRAVENOUS

## 2012-07-15 MED ORDER — DOFETILIDE 125 MCG PO CAPS
125.0000 ug | ORAL_CAPSULE | Freq: Two times a day (BID) | ORAL | Status: DC
  Filled 2012-07-15 (×2): qty 1

## 2012-07-15 MED ORDER — ATORVASTATIN CALCIUM 20 MG PO TABS
20.0000 mg | ORAL_TABLET | Freq: Every day | ORAL | Status: DC
  Administered 2012-07-15: 20 mg via ORAL
  Filled 2012-07-15 (×2): qty 1

## 2012-07-15 MED ORDER — CYCLOBENZAPRINE HCL 10 MG PO TABS: 5.0000 mg | ORAL_TABLET | Freq: Three times a day (TID) | ORAL | Status: DC | PRN

## 2012-07-15 MED ORDER — FENTANYL CITRATE 0.05 MG/ML IJ SOLN
INTRAMUSCULAR | Status: AC
  Filled 2012-07-15: qty 2

## 2012-07-15 NOTE — H&P (View-Only) (Signed)
 ELECTROPHYSIOLOGY CONSULT NOTE  Patient ID: Russell Griffith, MRN: [id number], DOB/AGE: 05/07/1946 66 y.o. Admit date: (Not on file) Date of Consult: 07/13/2012  Primary Physician: Michael Norins, MD Primary Cardiologist: jk  Chief Complaint: pauses  HPI Russell Griffith is a 66 y.o. male  seen at the request of Dr. Katz because of significant pauses noted on the monitor.  He is a history of atrial fibrillation. He was treated with Tikosyn. It was notable that he had late QT prolongation and has been treated now with 375 mcg twice daily with some recent increase in the frequency of his atrial fibrillation  He has a history of bradycardia. With significant exercise his heart rate gets up to about 110. He has noted with exercise however, worse of heart rates in the 180s or so with irregularity. It is his impression that over recent months the frequency of his atrial fibrillation has increased. He has had no significant episodes of lightheadedness. He has had more fatigue. While he does not snore he does find himself falling asleep in the early evenings while watching television.  He has a history of coronary artery disease with prior bypass grafting echo is pending this afternoon. He does not have orthopnea nocturnal dyspnea or peripheral edema.   Past Medical History  Diagnosis Date  . CAD (coronary artery disease)     Myoview March, 2011, excellent exercise, hypertensive response, no scar or ischemia, EF 61%, mild palpitations peak stress with infrequent PACs and PVCs.  . HTN (hypertension)     Controlled at rest, hypertension on treadmill, sensitive to medications  . Hyperlipidemia     Low HDL  . GERD (gastroesophageal reflux disease)     Barrett's esophagus  . Colonic polyp   . A-fib      Admission for Tikosyn load, March 20, 2011, converted after first dose, QTC 440 at the time of discharge. / Office note March 27, 2011, clinically in and out of atrial, QTC 500 ms, Dr.Klein decreased  Tikosyn to 375mg BID.  Atrial fib at rest February 25, 2011 / plan to admit for Tickosyn therapy  . Palpitations      exercise palpitations, probably A. fib-not VT,Pindolol not tolerated, CAD, bradycardia,Tickosyn needs hospital +/_ side effects, amiodarone & sotalol could need pacer,Multaq less effective not tried, atrial fib ablation could be considered at low risk  . Foot pain     Chronic lateral foot pain-resolved with use of orthotics '09  . Bradycardia   . Aortic stenosis     Mild, echo, April, 2012  . PVC's (premature ventricular contractions)   . Carotid artery disease     Doppler November, 2010, 0-39% bilateral followup 2 years  . Atrial flutter     Status post ablation  . Warfarin anticoagulation     Coumadin for atrial fib  . Aortic insufficiency      Mild, echo, 2010 / mild/moderate, echo, 2012  . Elevated bilirubin     Mild chronic elevation, 2.0 January, 2011 stable  . BPH (benign prostatic hyperplasia)   . Drug intolerance     Pindolol before exercise not tolerated  . Ejection fraction     EF 60-65%, echo, September, 2010 / EF 60-65% echo, April, 2012  . Hypokalemia     Potassium 3.6 before potassium started April, 2012  . Hx of colonoscopy   . Primary osteoarthritis of left knee     Mild  . Chronic foot pain 2009    Resolved with use of   orthotics  . Intolerance of drug     Fish oil, bilirubin, nacin  . BPH (benign prostatic hyperplasia)   . Pulmonic stenosis     Mild, echo, April, 2012  . Drug therapy     Tikosyn April, 2012  . TSH (thyroid-stimulating hormone deficiency)   . Hemorrhoids     September, 2012  . Numbness of toes     Slight numbness of one toe, September, 2012  . Hx of CABG   . Lung granuloma     Left  lung chest x-ray July, 2013      Surgical History:  Past Surgical History  Procedure Date  . Coronary artery bypass graft 2000  . Tonsillectomy      Home Meds: Prior to Admission medications   Medication Sig Start Date End Date Taking?  Authorizing Provider  aspirin 81 MG tablet Take 81 mg by mouth daily.     Yes Historical Provider, MD  atorvastatin (LIPITOR) 20 MG tablet Take 1 tablet (20 mg total) by mouth daily. 05/09/12  Yes Jeffrey D Katz, MD  Calcium & Magnesium Carbonates (MYLANTA PO) Take 10 mg by mouth daily.    Yes Historical Provider, MD  cyclobenzaprine (FLEXERIL) 5 MG tablet Take 1 tablet (5 mg total) by mouth every 8 (eight) hours as needed for muscle spasms. 07/04/12 07/04/13 Yes Valerie A Leschber, MD  dofetilide (TIKOSYN) 250 MCG capsule  01/18/12  Yes Steven C Klein, MD  esomeprazole (NEXIUM) 40 MG capsule Take 1 capsule (40 mg total) by mouth daily before breakfast. 09/23/11  Yes Daniel P Jacobs, MD  Multiple Vitamin (MULTIVITAMIN) capsule Take 1 capsule by mouth daily.     Yes Historical Provider, MD  potassium chloride (MICRO-K) 10 MEQ CR capsule Take 10 mEq by mouth daily.   Yes Historical Provider, MD  ramipril (ALTACE) 2.5 MG capsule Take 1 capsule (2.5 mg total) by mouth daily. 04/18/12  Yes Jeffrey D Katz, MD  valACYclovir (VALTREX) 1000 MG tablet Take 1,000 mg by mouth as needed.    Yes Historical Provider, MD  warfarin (COUMADIN) 5 MG tablet Take as directed by [facility] 03/14/12  Yes Jeffrey D Katz, MD    *Allergies:  Allergies  Allergen Reactions  . Sulfonamide Derivatives     History   Social History  . Marital Status: Married    Spouse Name: N/A    Number of Children: 2  . Years of Education: 18   Occupational History  . Administrator     Chief officer [employer]   Social History Main Topics  . Smoking status: Never Smoker   . Smokeless tobacco: Never Used  . Alcohol Use: Yes     2 glasses of wine per day  . Drug Use: No  . Sexually Active: Yes -- Male partner(s)   Other Topics Concern  . Not on file   Social History Narrative   [employer], MBA.  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  2 daughters-'75, '79; 1  grandchild; step-daughter and step grandson.  Regular exercise-yes, runs 1.5-2 mi 4x/wk, also elipticalPatient signed a Designated Party Release to allow his wife, [name], to have access to his medical records/ information.     Family History  Problem Relation Age of Onset  . Hyperlipidemia    . Hypertension    . Coronary artery disease Other 72  . Diabetes Other   . Colon cancer Neg Hx      ROS:  Please   see the history of present illness.  Mild arthritis; glasses; All other systems reviewed and negative.    Physical Exam:  Blood pressure 166/74, pulse 47, height 5' 11" (1.803 m), weight 194 lb 6.4 oz (88.179 kg). General: Well developed, well nourished male in no acute distress. Head: Normocephalic, atraumatic, sclera non-icteric, no xanthomas, nares are without discharge. Lymph Nodes:  none Back: without scoliosis/kyphosis, no CVA tendersness Neck: Negative for carotid bruits. JVD not elevated. Lungs: Clear bilaterally to auscultation without wheezes, rales, or rhonchi. Breathing is unlabored. Heart: RRR with S1 S2. No murmur , rubs, or gallops appreciated. Abdomen: Soft, non-tender, non-distended with normoactive bowel sounds. No hepatomegaly. No rebound/guarding. No obvious abdominal masses. Msk:  Strength and tone appear normal for age. Extremities: No clubbing or cyanosis. No edema.  Distal pedal pulses are 2+ and equal bilaterally. Skin: Warm and Dry Neuro: Alert and oriented X 3. CN III-XII intact Grossly normal sensory and motor function . Psych:  Responds to questions appropriately with a normal affect.      Labs: Cardiac Enzymes No results found for this basename: CKTOTAL:4,CKMB:4,TROPONINI:4 in the last 72 hours CBC Lab Results  Component Value Date   WBC 6.7 04/04/2012   HGB 14.9 04/04/2012   HCT 43.8 04/04/2012   MCV 88.3 04/04/2012   PLT 159.0 04/04/2012   PROTIME: No results found for this basename: LABPROT:3,INR:3 in the last 72 hours Chemistry No results  found for this basename: NA,K,CL,CO2,BUN,CREATININE,CALCIUM,LABALBU,PROT,BILITOT,ALKPHOS,ALT,AST,GLUCOSE in the last 168 hours Lipids Lab Results  Component Value Date   CHOL 119 04/04/2012   HDL 31.80* 04/04/2012   LDLCALC 78 04/04/2012   TRIG 46.0 04/04/2012   BNP No results found for this basename: probnp   Miscellaneous No results found for this basename: DDIMER    Radiology/Studies:  Dg Chest 2 View  06/17/2012  *RADIOLOGY REPORT*  Clinical Data: Shortness of breath.  Nonsmoker.  CHEST - 2 VIEW  Comparison: 03/20/2011.  Findings: Post CABG.  Cardiomegaly.  Mildly tortuous aorta. Central pulmonary vascular prominence.  Granuloma left upper lung.  IMPRESSION: No significant change.  Please see above.  Original Report Authenticated By: STEVEN R. OLSON, M.D.    EKG:  July 2013 demonstrated sinus rhythm at 37 intervals 28/08/44 Holter monitor demonstrates frequent episodes of an atrial fibrillation with posttermination pauses. Atrial fibrillation rates are in the 160-80 range.   Assessment and Plan: * Steven Klein  

## 2012-07-15 NOTE — Progress Notes (Signed)
Plan discharge tomorrow  followup SK 10-14 days ( iwill do wound check)   Send home on ASA    Instructions given   Remove bulky dressing in Am Steristrips in place until seen  In office Keep wound dry for 7 days No driving for 7 days Wound check in office , to be scheduled prior to release

## 2012-07-15 NOTE — Progress Notes (Signed)
Assessed for utilization review 

## 2012-07-15 NOTE — Interval H&P Note (Signed)
History and Physical Interval Note:  07/15/2012 8:53 AM  Russell Griffith  has presented today for surgery, with the diagnosis of bradicardia  The various methods of treatment have been discussed with the patient and family. After consideration of risks, benefits and other options for treatment, the patient has consented to  Procedure(s) (LRB): PERMANENT PACEMAKER INSERTION (N/A) as a surgical intervention .  The patient's history has been reviewed, patient examined, no change in status, stable for surgery.  I have reviewed the patient's chart and labs.  Questions were answered to the patient's satisfaction.     Sherryl Manges

## 2012-07-15 NOTE — CV Procedure (Signed)
Preop DX:: AF post termination pauses Post op DX:: same  Procedure  dual pacemaker implantation  After routine prep and drape, lidocaine was infiltrated in the prepectoral subclavicular region on the left side an incision was made and carried down to later the prepectoral fascia using electrocautery and sharp dissection a pocket was formed similarly. Hemostasis was obtained.  After this, we turned our attention to gaining accessm to the extrathoracic,left subclavian vein. This was accomplished without difficulty and without the aspiration of air or puncture of the artery. 2 separate venipunctures were accomplished; guidewires were placed and retained and sequentially 7 French sheath through which were  passed an Medtronic ventricular lead serial number [id number] and an Medtronic atrial lead serial number [id number] .  The ventricular lead was manipulated to the right ventricular apex with a bipolar R wave was 17, the pacing impedance was 1039, the threshold was 1.0 @ 0.5 msec  Current at threshold was   1.0 Ma and the current of injury was  brisk.  The right atrial lead was manipulated to the right atrial appendage with a bipolar P-wave  4.2, the pacing impedance was 801, the threshold 1.2@ 0.5 msec   Current at threshold was 1.5  Ma and the current of injury was brisk.  The leads were affixed to the prepectoral fascia and attached to a  [employer] pulse generator serial number [id number].  Hemostasis was obtained. The pocket was copiously irrigated with antibiotic containing saline solution. The leads and the pulse generator were placed in the pocket and affixed to the prepectoral fascia. The wound was then closed in 2 layers in the normal fashion. The wound was washed dried and a benzoin Steri-Strip dressing was applied.  Needle  Count, sponge counts and instrument counts were correct at the end of the procedure .   The patient tolerated the procedure without apparent  complication.  Gerlene Burdock.D.

## 2012-07-16 ENCOUNTER — Ambulatory Visit (HOSPITAL_COMMUNITY): Payer: 59

## 2012-07-16 ENCOUNTER — Other Ambulatory Visit: Payer: Self-pay

## 2012-07-16 DIAGNOSIS — Z950 Presence of cardiac pacemaker: Secondary | ICD-10-CM

## 2012-07-16 DIAGNOSIS — I498 Other specified cardiac arrhythmias: Secondary | ICD-10-CM

## 2012-07-16 MED ORDER — DOFETILIDE 125 MCG PO CAPS: 375.0000 ug | ORAL_CAPSULE | Freq: Two times a day (BID) | ORAL | Status: DC

## 2012-07-16 NOTE — Discharge Summary (Signed)
Physician Discharge Summary  Patient ID: Russell Griffith MRN: [id number] DOB/AGE: [birth year] 66 y.o.  Admit date: 07/15/2012 Discharge date: 07/16/2012  Primary Discharge Diagnosis: Bradycardia Secondary Discharge Diagnosis: 1. Atrial Fibrillation 2. Hypertension 3. CAD  Significant Diagnostic Studies:  1. Status post [facility] Scientific dual-chamber pacemaker serial number [id number] with Medtronic ventricular lead serial number [id number] and Medtronic atrial lead serial number PJN  [id number]  Consults: None  Hospital Course: Russell Griffith is a 66 year old patient of Dr. Willa Rough and Dr. Sherryl Manges who was admitted for implantation of dual-chamber pacemaker in the setting of bradycardia. He should also has a history of atrial fibrillation and is on Tikosyn. Dual-chamber pacemaker, manufacturer and serial numbers as above was implanted by Dr. Clide Cliff on 07/15/2012. The patient tolerated the procedure well, without evidence of bleeding, hematoma, signs of infection, or severe pain. The pacemaker has been interrogated this a.m. prior to patient being discharged. The patient's been seen and examined by myself and Dr.  Bing prior to discharge. I also spoke with Dr. Graciela Husbands by phone, concerning the need to restart Coumadin. Dr. Clide Cliff advises the patient be started on aspirin only with followup in his clinic in one week. Standard post-pacemaker instructions have been provided for the patient in written and verbal form. The patient verbalizes understanding after multiple questions are answered, and is very anxious to return home.  Discharge Exam: Blood pressure 148/79, pulse 50, temperature 97.8 F (36.6 C), temperature source Oral, resp. rate 14, height 5\' 11"  (1.803 m), weight 194 lb 3.6 oz (88.1 kg), SpO2 97.00%.   Pacemaker site is benign with minimal dried blood on dressing, mild incisional tenderness and no discharge, edema or erythema. Cardiac: Normal first and second heart sounds.  Lungs: Clear.  Labs:   Lab Results  Component Value Date   WBC 6.8 07/13/2012   HGB 15.7 07/13/2012   HCT 46.0 07/13/2012   MCV 85.0 07/13/2012   PLT 197 07/13/2012    Lab 07/13/12 1700  NA 141  K 4.2  CL 105  CO2 30  BUN 23  CREATININE 1.12  CALCIUM 9.2  PROT --  BILITOT --  ALKPHOS --  ALT --  AST --  GLUCOSE 90   Radiology: Dg Chest 2 View 07/16/12: Pending .  FOLLOW UP PLANS AND APPOINTMENTS Discharge Orders    Future Appointments: Provider: Department: Dept Phone: Center:   07/27/2012 8:00 AM [facility] [phone] None     Future Orders Please Complete By Expires   Diet - low sodium heart healthy      Increase activity slowly      Leave dressing on - Keep it clean, dry, and intact until clinic visit        Medication List  As of 07/16/2012  8:10 AM   STOP taking these medications         TIKOSYN 250 MCG capsule      warfarin 5 MG tablet         TAKE these medications         aspirin 81 MG tablet   Take 81 mg by mouth daily.      atorvastatin 20 MG tablet   Commonly known as: LIPITOR   Take 1 tablet (20 mg total) by mouth daily.      cyclobenzaprine 5 MG tablet   Commonly known as: FLEXERIL   Take 1 tablet (5 mg total) by mouth every 8 (eight) hours as needed for muscle spasms.  dofetilide 125 MCG capsule   Commonly known as: TIKOSYN   Take 3 capsules (375 mcg total) by mouth 2 (two) times daily.      esomeprazole 40 MG capsule   Commonly known as: NEXIUM   Take 1 capsule (40 mg total) by mouth daily before breakfast.      multivitamin with minerals Tabs   Take 1 tablet by mouth daily.      MYLANTA PO   Take 10 mg by mouth daily as needed.      potassium chloride 10 MEQ CR capsule   Commonly known as: MICRO-K   Take 10 mEq by mouth daily.      ramipril 2.5 MG capsule   Commonly known as: ALTACE   Take 1 capsule (2.5 mg total) by mouth daily.      valACYclovir 1000 MG tablet   Commonly known  as: VALTREX   Take 1,000 mg by mouth daily as needed. FOR FEVER BLISTER           Follow-up Information    Follow up with Sherryl Manges, MD. (Our office will call you for appointment)    Contact information:   [address] [place] [address] [phone]       Follow up with Willa Rough, MD. (Our office will call you for appointment)    Contact information:   [address] [place] [address] [phone]    Time spent with patient to include physician time: 45 minutes  Joni Reining 07/16/2012, 8:10 AM  Cardiology Attending Patient interviewed and examined. Discussed with Joni Reining, NP.  Above note annotated and modified based upon my findings.  Okay for discharge if chest x-ray unremarkable. Patient advised regarding possible pacemaker complications and the need to call should dyspnea, chest discomfort, swelling at the operative site, erythema, discharge from the wound, lightheadedness, syncope or fever develop.  Lewes Bing, MD 07/16/2012, 8:37 AM

## 2012-07-16 NOTE — Progress Notes (Signed)
SUBJECTIVE: Sore but feeling well and wants to go home.  Active Problems:  Bradycardia  A-fib   LABS: Basic Metabolic Panel:  Basename 07/13/12 1700  NA 141  K 4.2  CL 105  CO2 30  GLUCOSE 90  BUN 23  CREATININE 1.12  CALCIUM 9.2  MG --  PHOS --   CBC:  Basename 07/13/12 1700  WBC 6.8  NEUTROABS 3.6  HGB 15.7  HCT 46.0  MCV 85.0  PLT 197     RADIOLOGY:Chest X-Ray ordered this am.   PHYSICAL EXAM BP 148/79  Pulse 50  Temp 97.8 F (36.6 C) (Oral)  Resp 14  Ht 5\' 11"  (1.803 m)  Wt 194 lb 3.6 oz (88.1 kg)  BMI 27.09 kg/m2  SpO2 97% General: Well developed, well nourished, in no acute distress Head: Eyes PERRLA, No xanthomas.   Normal cephalic and atramatic  Lungs: Clear bilaterally to auscultation and percussion. Heart: HRRR S1 S2, No MRG .  Pulses are 2+ & equal.            No carotid bruit. No JVD.  No abdominal bruits. No femoral bruits. Abdomen: Bowel sounds are positive, abdomen soft and non-tender without masses or                  Hernia's noted. Msk:  Back normal, normal gait. Normal strength and tone for age. Pacemaker site in the left upper chest is clean with some dry blood on Steri-Strips after removal of bulky dressing. There is no erythema or evidence of infection, edema, or hematoma. Extremities: No clubbing, cyanosis or edema.  DP +1 Neuro: Alert and oriented X 3. Psych:  Good affect, responds appropriately  TELEMETRY: Reviewed telemetry pt in: Atrial fib with atrial sensing,  ASSESSMENT AND PLAN:  1. Bradycardia with pauses: Status post pacemaker implantation, Medtronic atrial lead PEJ and [id number], ventricular lead PEJ and [phone] placed by Dr. Sherryl Manges on 07/15/2012. Pacemaker site looks well, and was evidence of old dried blood on the dressing was not removed, Steri-Strips are in place and secure. I have spoken with Dr. Graciela Husbands by phone concerning need to restart Coumadin, he will be placed on aspirin only. Dr. Graciela Husbands will see him in  the office in one week for pacemaker evaluation and check. Post pacemaker insertion instructions are provided.  2. Atrial fibrillation: Heart rate well controlled. Continues on Tikosyn 375 mcg daily. He will followup with Dr. Graciela Husbands in the office in one week for reevaluation, as stated above no Coumadin but will remain on aspirin. He will also follow up with Dr. Myrtis Ser in the office for continued cardiac management.  Bettey Mare. Lyman Bishop NP [facility] 07/16/2012, 7:37 AM

## 2012-07-18 ENCOUNTER — Telehealth: Payer: Self-pay | Admitting: Internal Medicine

## 2012-07-18 NOTE — Telephone Encounter (Signed)
New Problem:    Patient called in because he has some bruising around his device site and would like to consult with some one about that, and would like to know when his next appointment should be.  Please call back.

## 2012-07-18 NOTE — Telephone Encounter (Signed)
Per Dr. Graciela Husbands, patient is advised not to fly 07/22/12.  Also patient to call back if bruising becomes worse or he develops swelling @ the site.

## 2012-07-18 NOTE — Telephone Encounter (Signed)
Follow-up:    Patient called in.  Please call back.

## 2012-07-19 ENCOUNTER — Other Ambulatory Visit: Payer: Self-pay

## 2012-07-19 ENCOUNTER — Encounter: Payer: Self-pay | Admitting: Cardiology

## 2012-07-19 ENCOUNTER — Encounter: Payer: Self-pay | Admitting: *Deleted

## 2012-07-19 MED ORDER — APIXABAN 5 MG PO TABS: 5.0000 mg | ORAL_TABLET | Freq: Two times a day (BID) | ORAL | Status: DC

## 2012-07-19 NOTE — Progress Notes (Signed)
   After further discussion between [place], Dr. Graciela Husbands I have agreed that we do want him to be on an anticoagulant. We have chosen apixaban. He will receive that at 5 mg twice a day. For now he will continue his aspirin at 81 mg daily. He has coronary disease and borderline hypertension and he will be 67 soon. I have explained this to him. Previously he has been on Coumadin. We will plan to check CBC and renal function every 6 months.   His pacemaker is in place. Dr. Graciela Husbands has spoken with Dr. Delena Serve about his atrial fibrillation. The patient knows to speak further with Dr. Graciela Husbands about if and when he wants to consider ablation further.

## 2012-07-20 ENCOUNTER — Telehealth: Payer: Self-pay | Admitting: Cardiology

## 2012-07-20 NOTE — Telephone Encounter (Signed)
Fu call °Patient returning your call °

## 2012-07-20 NOTE — Telephone Encounter (Signed)
Per Dr. Graciela Husbands, have the patient come by for Dr. Graciela Husbands to look at his site this after noon. I have left a message for the patient to call.

## 2012-07-20 NOTE — Telephone Encounter (Signed)
Pt is concerned about a large bruise around his pacemaker site.  It is from his collarbone down under his arm (8"x4").  Slight swelling, no drainage.  No temp.  He is concerned about starting on anticoagulants with the bruise he has.  No bleeding from incision.

## 2012-07-20 NOTE — Telephone Encounter (Signed)
Message sent to Dr. Graciela Husbands regarding if the patient should hold eliquis for now and remain on aspirin only until his wound check.

## 2012-07-20 NOTE — Telephone Encounter (Signed)
I spoke with the patient. He will come by around 3:00 pm today for Dr. Graciela Husbands to look at his site prior to starting eliquis.

## 2012-07-20 NOTE — Telephone Encounter (Signed)
Pt wants to talk to you about Rx that was called in and how area is healing around his site

## 2012-07-21 NOTE — Assessment & Plan Note (Signed)
Given a compression sleeve  Avoid deep bend  Cont strength exercises  Also consider injecition for pain relief as needed

## 2012-07-28 ENCOUNTER — Encounter: Payer: Self-pay | Admitting: *Deleted

## 2012-07-28 ENCOUNTER — Encounter: Payer: Self-pay | Admitting: Internal Medicine

## 2012-07-28 ENCOUNTER — Ambulatory Visit (INDEPENDENT_AMBULATORY_CARE_PROVIDER_SITE_OTHER): Payer: 59 | Admitting: *Deleted

## 2012-07-28 DIAGNOSIS — I498 Other specified cardiac arrhythmias: Secondary | ICD-10-CM

## 2012-07-28 DIAGNOSIS — I4891 Unspecified atrial fibrillation: Secondary | ICD-10-CM

## 2012-07-28 DIAGNOSIS — I495 Sick sinus syndrome: Secondary | ICD-10-CM

## 2012-07-28 DIAGNOSIS — R001 Bradycardia, unspecified: Secondary | ICD-10-CM

## 2012-07-28 LAB — PACEMAKER DEVICE OBSERVATION
AL THRESHOLD: 0.5 V
ATRIAL PACING PM: 68
DEVICE MODEL PM: 111255
RV LEAD IMPEDENCE PM: 617 Ohm
RV LEAD THRESHOLD: 1 V

## 2012-07-28 NOTE — Progress Notes (Signed)
defib check in clinic  

## 2012-07-29 ENCOUNTER — Telehealth: Payer: Self-pay | Admitting: Internal Medicine

## 2012-07-29 DIAGNOSIS — I4891 Unspecified atrial fibrillation: Secondary | ICD-10-CM

## 2012-07-29 MED ORDER — DOFETILIDE 125 MCG PO CAPS: 375.0000 ug | ORAL_CAPSULE | Freq: Two times a day (BID) | ORAL | Status: DC

## 2012-07-29 NOTE — Telephone Encounter (Signed)
RX sent in to [facility]. I have contacted the pharmacy and let them know this was done.

## 2012-07-29 NOTE — Telephone Encounter (Signed)
Please return call to [facility] - [place], [place] [phone]    Regarding call in RX for Tikosyn .  Pharmacist said it was suppose to be called in yesterday and would like to speak with nurse.

## 2012-08-02 ENCOUNTER — Ambulatory Visit (INDEPENDENT_AMBULATORY_CARE_PROVIDER_SITE_OTHER): Payer: 59 | Admitting: Sports Medicine

## 2012-08-02 DIAGNOSIS — M722 Plantar fascial fibromatosis: Secondary | ICD-10-CM

## 2012-08-02 MED ORDER — HYDROCODONE-ACETAMINOPHEN 5-325 MG PO TABS: 1.0000 | ORAL_TABLET | Freq: Four times a day (QID) | ORAL | Status: AC | PRN

## 2012-08-02 NOTE — Progress Notes (Signed)
Patient ID: Russell Griffith, male   DOB: 12-27-1945, 66 y.o.   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 Tibial nerve block Identification of tibial nerve behind malleolus by [place] followed by a tibial nerve block with 1.5 ccs of 1% lidocaine with sterile prep and sterile gel  2. Local block Injection portal identified on medial aspect of foot 1 cm anterior to the calcaneus and medial insertion of PF at location of calcification 2 ccs of lidocaine injected for wheal and then into soft tissue  3. Barbotage of Calcification Approximately 40 passes of needle into the calcification under direct US guidance.  Infiltration with 1 cc kenalog 10 and 1 cc lidocaine This was accomplished with good pain relief post blocks US shows some dissolution of the calcium and images saved to show injection into the calcium deposit

## 2012-08-02 NOTE — Assessment & Plan Note (Signed)
After barbotage:  Pressure wrap 24 hours Limit activity a lot for 48 hours and then keep easy x 1 week  After that resume PF stretches and exercises  Reck 4 weeks

## 2012-08-03 ENCOUNTER — Ambulatory Visit: Payer: Self-pay | Admitting: Pharmacist

## 2012-08-03 DIAGNOSIS — I4891 Unspecified atrial fibrillation: Secondary | ICD-10-CM

## 2012-08-03 DIAGNOSIS — Z7901 Long term (current) use of anticoagulants: Secondary | ICD-10-CM

## 2012-08-04 ENCOUNTER — Telehealth: Payer: Self-pay | Admitting: *Deleted

## 2012-08-04 NOTE — Telephone Encounter (Signed)
Message copied by Mora Bellman on Thu Aug 04, 2012  8:49 AM ------      Message from: CERESI, Shawna Orleans L      Created: Thu Aug 04, 2012  8:21 AM      Regarding: phone message      Contact: [phone]       Pt returned your call, states he is doing great,no pain.

## 2012-08-04 NOTE — Telephone Encounter (Signed)
Per Dr. Darrick Penna called pt yesterday to ask how he was doing s/p PF injection- and needling of calcifications.  His response is below.

## 2012-08-16 ENCOUNTER — Encounter: Payer: Self-pay | Admitting: Cardiology

## 2012-08-16 NOTE — Progress Notes (Signed)
   The patient has decided that he wants to proceed with atrial fib ablation. I have spoken with Margaretmary Dys. I sent a text to Dr. Graciela Husbands. The 2 of them will discuss the specifics of making plans with Dr.Wharton at the [facility]. They will communicate with the patient about the specifics.  I have reviewed carefully the issue of anticoagulation in this patient. He is on Apixaban 5 mg by mouth twice a day. This is being used because of his atrial fibrillation. The question arises as to whether or not we should continue aspirin. He does have coronary disease. He is stable post CABG done many years ago. He is not having any ischemic symptoms. He does not have a stent in place. He has mentioned to me that on Apixaban he bleeds very easily with any slight cut. At this time I feel that the increased bleeding risk from aspirin added to Apiaban outweighs the small possible benefit from having aspirin on board. Therefore, aspirin will not be used at this time.  Carola Frost, MD

## 2012-08-23 ENCOUNTER — Encounter: Payer: Self-pay | Admitting: *Deleted

## 2012-08-30 ENCOUNTER — Encounter: Payer: Self-pay | Admitting: Sports Medicine

## 2012-08-30 ENCOUNTER — Ambulatory Visit (INDEPENDENT_AMBULATORY_CARE_PROVIDER_SITE_OTHER): Payer: 59 | Admitting: Sports Medicine

## 2012-08-30 VITALS — BP 126/76 | HR 56 | Ht 71.0 in | Wt 194.0 lb

## 2012-08-30 DIAGNOSIS — M722 Plantar fascial fibromatosis: Secondary | ICD-10-CM

## 2012-08-30 NOTE — Assessment & Plan Note (Signed)
This is much improved  Cont exercises and stretches  Orthotics for sport  Given lateral heel wedge and this seems to take pressure off RT lateral heel where he has spur  Reck as needed

## 2012-08-30 NOTE — Progress Notes (Signed)
  Subjective:    Patient ID: Russell Griffith, male    DOB: 1945-12-09, 66 y.o.   MRN: [id number]  HPI Pt follows up for R heel pain s/p PF spur needling.  States that he has had no pain at medial heel, however he has pain at lateral heel which he has felt since the needling procedure.  Feels that the lateral heel pain may have been present all along, but with the distracting medial heel pain removed, he now felt the lateral heel pain.  R lateral heel pain hurts with pressure of any kind, especially walking/working-out.   Review of Systems    All negative except as in HPI Objective:   Physical Exam R heel:  nontender to palpation at plantar surface of heel or medial anterior heel No pain elicited with inversion or eversion Tender to palpation at lateral anterior heel which does not extended to tarsal/metatarsal  MSK [place] The calcific spur on medial side is gone Along central PF there is some continued spurring and calcification PF thickness is 0.56 Lateral calcaneus has a spur and a fluid pocket around this PF on lateral side is normal       Assessment & Plan:  Plantar fasciitis- U/S performed-lateral heel bone spur noted, heel wedges dispensed.

## 2012-09-09 ENCOUNTER — Other Ambulatory Visit: Payer: Self-pay | Admitting: *Deleted

## 2012-09-09 ENCOUNTER — Encounter: Payer: Self-pay | Admitting: Cardiology

## 2012-09-09 MED ORDER — ATORVASTATIN CALCIUM 20 MG PO TABS: 20.0000 mg | ORAL_TABLET | Freq: Every day | ORAL | Status: DC

## 2012-09-12 ENCOUNTER — Encounter: Payer: Self-pay | Admitting: Cardiology

## 2012-09-12 ENCOUNTER — Telehealth: Payer: Self-pay | Admitting: Cardiology

## 2012-09-12 ENCOUNTER — Ambulatory Visit (INDEPENDENT_AMBULATORY_CARE_PROVIDER_SITE_OTHER): Payer: 59 | Admitting: Cardiology

## 2012-09-12 VITALS — BP 115/68 | HR 55 | Ht 71.0 in | Wt 194.8 lb

## 2012-09-12 DIAGNOSIS — K219 Gastro-esophageal reflux disease without esophagitis: Secondary | ICD-10-CM

## 2012-09-12 DIAGNOSIS — I2510 Atherosclerotic heart disease of native coronary artery without angina pectoris: Secondary | ICD-10-CM

## 2012-09-12 DIAGNOSIS — Z950 Presence of cardiac pacemaker: Secondary | ICD-10-CM

## 2012-09-12 DIAGNOSIS — I4891 Unspecified atrial fibrillation: Secondary | ICD-10-CM

## 2012-09-12 MED ORDER — ATORVASTATIN CALCIUM 20 MG PO TABS: 20.0000 mg | ORAL_TABLET | Freq: Every day | ORAL | Status: DC

## 2012-09-12 NOTE — Assessment & Plan Note (Signed)
He will be following up with the GI team. He did restart taking his Nexium.

## 2012-09-12 NOTE — Patient Instructions (Addendum)
Your physician wants you to follow-up in:  6 months. You will receive a reminder letter in the mail two months in advance. If you don't receive a letter, please call our office to schedule the follow-up appointment.   

## 2012-09-12 NOTE — Assessment & Plan Note (Addendum)
He had received some type of monitoring equipment for his pacemaker. It requires a landline. He spoke with Gunnar Fusi about this and she has taken the equipment back. He says that he feels much better with the pacemaker. He is very pleased with the care. He's not having any shortness of breath.

## 2012-09-12 NOTE — Assessment & Plan Note (Signed)
Coronary disease is stable. He needs a followup exercise test later.

## 2012-09-12 NOTE — Progress Notes (Signed)
Patient ID: Russell Griffith, male   DOB: May 26, 1946, 66 y.o.   MRN:    HPI  Patient is seen today for cardiology followup. He is doing very well with his new pacemaker. He thinks it at rest he might have some decrease in his atrial fib. He feels that he does still have atrial fib when he increases his activity level. Overall he is pleased with his status.  Allergies  Allergen Reactions  . Sulfonamide Derivatives     "have no idea; mother told me I was allergic to"    Current Outpatient Prescriptions  Medication Sig Dispense Refill  . apixaban (ELIQUIS) 5 MG TABS tablet Take 1 tablet (5 mg total) by mouth 2 (two) times daily.  60 tablet  11  . atorvastatin (LIPITOR) 20 MG tablet Take 1 tablet (20 mg total) by mouth daily.  90 tablet  3  . Calcium & Magnesium Carbonates (MYLANTA PO) Take 10 mg by mouth daily as needed.       . cyclobenzaprine (FLEXERIL) 5 MG tablet Take 1 tablet (5 mg total) by mouth every 8 (eight) hours as needed for muscle spasms.  30 tablet  1  . dofetilide (TIKOSYN) 125 MCG capsule Take 375 mcg by mouth 2 (two) times daily.      Marland Vanduzer esomeprazole (NEXIUM) 40 MG capsule Take 1 capsule (40 mg total) by mouth daily before breakfast.  30 capsule  11  . Multiple Vitamin (MULTIVITAMIN WITH MINERALS) TABS Take 1 tablet by mouth daily.      . potassium chloride (MICRO-K) 10 MEQ CR capsule Take 10 mEq by mouth daily.      . ramipril (ALTACE) 2.5 MG capsule Take 1 capsule (2.5 mg total) by mouth daily.  30 capsule  5  . valACYclovir (VALTREX) 1000 MG tablet Take 1,000 mg by mouth daily as needed. FOR FEVER BLISTER      . DISCONTD: atorvastatin (LIPITOR) 20 MG tablet Take 1 tablet (20 mg total) by mouth daily.  90 tablet  3  . DISCONTD: dofetilide (TIKOSYN) 125 MCG capsule Take 3 capsules (375 mcg total) by mouth 2 (two) times daily.  180 capsule  6    History   Social History  . Marital Status: Married    Spouse Name: N/A    Number of Children: 2  . Years of Education: 18    Occupational History  . [employer]   Social History Main Topics  . Smoking status: Never Smoker   . Smokeless tobacco: Never Used  . Alcohol Use: 12.6 oz/week    21 Glasses of wine per week  . Drug Use: No  . Sexually Active: Yes -- Male partner(s)   Other Topics Concern  . Not on file   Social History Narrative   [employer] [place], [place].  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  2 daughters-'75, '79; 1 grandchild; step-daughter and step grandson.  Regular exercise-yes, runs 1.5-2 mi 4x/wk, also elipticalPatient signed a Designated Party Release to allow his wife, Ezreal Turay, to have access to his medical records/ information.    Family History  Problem Relation Age of Onset  . Hyperlipidemia    . Hypertension    . Coronary artery disease Other 72  . Diabetes Other   . Colon cancer Neg Hx     Past Medical History  Diagnosis Date  . CAD (coronary artery disease)     Myoview March, 2011, excellent  exercise, hypertensive response, no scar or ischemia, EF 61%, mild palpitations peak stress with infrequent PACs and PVCs.  Marland Cerney Hyperlipidemia     Low HDL  . GERD (gastroesophageal reflux disease)     Barrett's esophagus  . Colonic polyp   . Palpitations      exercise palpitations, probably A. fib-not VT,Pindolol not tolerated, CAD, bradycardia,Tickosyn needs hospital +/_ side effects, amiodarone & sotalol could need pacer,Multaq less effective not tried, atrial fib ablation could be considered at low risk  . Foot pain     Chronic lateral foot pain-resolved with use of orthotics '09  . Aortic stenosis     Mild, echo, April, 2012  . Carotid artery disease     Doppler November, 2010, 0-39% bilateral followup 2 years  . Warfarin anticoagulation     Coumadin for atrial fib  . Aortic insufficiency      Mild, echo, 2010 / mild/moderate, echo, 2012  . Elevated bilirubin     Mild chronic  elevation, 2.0 January, 2011 stable  . BPH (benign prostatic hyperplasia)   . Drug intolerance     Pindolol before exercise not tolerated  . Ejection fraction     EF 60-65%, echo, September, 2010 / EF 60-65% echo, April, 2012  . Hypokalemia     Potassium 3.6 before potassium started April, 2012  . Hx of colonoscopy   . Chronic foot pain 2009    Resolved with use of orthotics  . Intolerance of drug     Fish oil, bilirubin, nacin  . BPH (benign prostatic hyperplasia)   . Pulmonic stenosis     Mild, echo, April, 2012  . Drug therapy     Tikosyn April, 2012  . TSH (thyroid-stimulating hormone deficiency)   . Hemorrhoids     September, 2012  . Numbness of toes     Slight numbness of one toe, September, 2012  . Lung granuloma     Left  lung chest x-ray July, 2013  . A-fib      Admission for Tikosyn load, March 20, 2011, converted after first dose, QTC 440 at the time of discharge. / Office note March 27, 2011, clinically in and out of atrial, QTC 500 ms, Dr.Klein decreased Tikosyn to 375mg  BID.  Atrial fib at rest February 25, 2011 / plan to admit for [facility] therapy  . PVC's (premature ventricular contractions)   . Atrial flutter     Status post ablation  . Arrhythmia     h/o bradycardia  . HTN (hypertension)     Controlled at rest, hypertension on treadmill, sensitive to medications  . Heart murmur     "since I was a little boy"  . MVP (mitral valve prolapse)   . Anginal pain 2000    "before bypass"  . Shortness of breath 07/15/2012    "can happen at any time; related to AF and bradycardia"  . Barrett's esophagus   . Migraines 07/15/2012    "years ago; before glasses changed"  . Primary osteoarthritis of left knee     Mild    Past Surgical History  Procedure Date  . Insert / replace / remove pacemaker 07/15/2012    initial placement  . Tonsillectomy 1956  . Coronary artery bypass graft 2000    CABG X5    Patient Active Problem List  Diagnosis  . ADENOMATOUS COLONIC  POLYP  . THYROID STIMULATING HORMONE, ABNORMAL  . ANXIETY  . ANXIETY DEPRESSION  . PREMATURE VENTRICULAR CONTRACTIONS  . HEMORRHOIDS  .  ESOPHAGITIS  . ESOPHAGEAL STENOSIS  . GERD  . BARRETTS ESOPHAGUS  . GASTRITIS, CHRONIC  . DUODENITIS  . HIATAL HERNIA  . DIVERTICULOSIS, COLON  . BENIGN PROSTATIC HYPERTROPHY, WITH OBSTRUCTION  . OSTEOARTHRITIS, KNEE, LEFT  . DERANGEMENT OF MENISCUS NOT ELSEWHERE CLASSIFIED  . Knee pain  . Unspecified disorders of bursae and tendons in shoulder region  . FOOT PAIN, LEFT  . OTHER CONGENITAL VARUS DEFORMITY OF FEET  . COLONIC POLYPS, HX OF  . PROSTATITIS, HX OF  . Bradycardia  . PVC's (premature ventricular contractions)  . Carotid artery disease  . Elevated bilirubin  . Palpitations  . Ejection fraction  . QT prolongation on Tikosyn  . A-fib  . Hypokalemia  . Pulmonic stenosis  . Aortic insufficiency  . Warfarin anticoagulation  . Drug therapy  . HTN (hypertension)  . Hyperlipidemia  . Drug intolerance  . Numbness of toes  . CAD (coronary artery disease)  . Hx of CABG  . Encounter for long-term (current) use of anticoagulants  . Tumescence  . Plantar fasciitis of right foot  . Routine health maintenance  . Shortness of breath  . Nausea  . Aortic stenosis  . Lung granuloma  . Sinus node dysfunction/chronotropic incompetence/posttermination pausing  . PPM-[employer]  . Chronic anticoagulation    ROS   Patient denies fever, chills, headache, sweats, rash, change in vision, change in hearing, chest pain, cough, nausea vomiting, urinary symptoms. All other systems are reviewed and are negative.  PHYSICAL EXAM   Patient is oriented to person time and place. Affect is normal. There is no jugulovenous distention. Lungs are clear. Respiratory effort is nonlabored. Cardiac exam reveals S1 and S2. There no clicks or significant murmurs. Abdomen is soft. There is no peripheral edema.  Filed Vitals:   09/12/12 1203  BP: 115/68   Pulse: 55  Height: 5\' 11"  (1.803 m)  Weight: 194 lb 12.8 oz (88.361 kg)  SpO2: 99%    ASSESSMENT & PLAN

## 2012-09-12 NOTE — Telephone Encounter (Signed)
All Cardiac Records Were faxed to [facility] ([name]) Dr.Grabiel Loyal @ [phone]   09/12/12/KM

## 2012-09-12 NOTE — Assessment & Plan Note (Signed)
The patient is scheduled to meet Dr. Delena Serve on October 25. Our team should be sure that our records her there. The patient asked if he would be taken off Tikosyn if the atrial fibrillation were successful. I told him that I thought that her ultimate goal would be to have him off the drug.

## 2012-09-26 ENCOUNTER — Encounter: Payer: Self-pay | Admitting: Cardiology

## 2012-09-26 NOTE — Progress Notes (Signed)
   The patient has seen Dr Delena Serve. Atrial fib ablation is to be scheduled for February.  The aptient has had some neck pain. He has known CAD. His last stress study was 01/2010. Dr. Delena Serve feels stress nuclear study should be done before the ablation. This will be scheduled.  Jerral Bonito, MD

## 2012-09-27 ENCOUNTER — Other Ambulatory Visit: Payer: Self-pay

## 2012-09-27 ENCOUNTER — Other Ambulatory Visit: Payer: Self-pay | Admitting: Internal Medicine

## 2012-09-27 DIAGNOSIS — I2510 Atherosclerotic heart disease of native coronary artery without angina pectoris: Secondary | ICD-10-CM

## 2012-09-28 ENCOUNTER — Other Ambulatory Visit: Payer: Self-pay

## 2012-09-28 MED ORDER — DOFETILIDE 125 MCG PO CAPS: 125.0000 ug | ORAL_CAPSULE | Freq: Two times a day (BID) | ORAL | Status: DC

## 2012-09-30 ENCOUNTER — Encounter: Payer: Self-pay | Admitting: Gastroenterology

## 2012-10-02 ENCOUNTER — Other Ambulatory Visit: Payer: Self-pay | Admitting: Gastroenterology

## 2012-10-05 ENCOUNTER — Telehealth: Payer: Self-pay | Admitting: *Deleted

## 2012-10-05 ENCOUNTER — Encounter: Payer: Self-pay | Admitting: Gastroenterology

## 2012-10-05 ENCOUNTER — Encounter: Payer: Self-pay | Admitting: Physician Assistant

## 2012-10-05 ENCOUNTER — Ambulatory Visit (HOSPITAL_COMMUNITY): Payer: 59 | Attending: Cardiovascular Disease | Admitting: Radiology

## 2012-10-05 ENCOUNTER — Ambulatory Visit (INDEPENDENT_AMBULATORY_CARE_PROVIDER_SITE_OTHER): Payer: 59 | Admitting: Physician Assistant

## 2012-10-05 VITALS — BP 143/84 | Ht 71.0 in | Wt 193.0 lb

## 2012-10-05 VITALS — BP 126/72 | HR 60 | Ht 70.75 in | Wt 192.3 lb

## 2012-10-05 DIAGNOSIS — R42 Dizziness and giddiness: Secondary | ICD-10-CM | POA: Insufficient documentation

## 2012-10-05 DIAGNOSIS — Z7901 Long term (current) use of anticoagulants: Secondary | ICD-10-CM

## 2012-10-05 DIAGNOSIS — R002 Palpitations: Secondary | ICD-10-CM | POA: Insufficient documentation

## 2012-10-05 DIAGNOSIS — K2270 Barrett's esophagus without dysplasia: Secondary | ICD-10-CM

## 2012-10-05 DIAGNOSIS — I10 Essential (primary) hypertension: Secondary | ICD-10-CM | POA: Insufficient documentation

## 2012-10-05 DIAGNOSIS — Z860100 Personal history of colon polyps, unspecified: Secondary | ICD-10-CM

## 2012-10-05 DIAGNOSIS — I2510 Atherosclerotic heart disease of native coronary artery without angina pectoris: Secondary | ICD-10-CM

## 2012-10-05 DIAGNOSIS — R0609 Other forms of dyspnea: Secondary | ICD-10-CM | POA: Insufficient documentation

## 2012-10-05 DIAGNOSIS — R0602 Shortness of breath: Secondary | ICD-10-CM | POA: Insufficient documentation

## 2012-10-05 DIAGNOSIS — R000 Tachycardia, unspecified: Secondary | ICD-10-CM | POA: Insufficient documentation

## 2012-10-05 DIAGNOSIS — R0989 Other specified symptoms and signs involving the circulatory and respiratory systems: Secondary | ICD-10-CM | POA: Insufficient documentation

## 2012-10-05 DIAGNOSIS — Z8249 Family history of ischemic heart disease and other diseases of the circulatory system: Secondary | ICD-10-CM | POA: Insufficient documentation

## 2012-10-05 MED ORDER — TECHNETIUM TC 99M SESTAMIBI GENERIC - CARDIOLITE
33.0000 | Freq: Once | INTRAVENOUS | Status: AC | PRN
  Administered 2012-10-05: 33 via INTRAVENOUS

## 2012-10-05 MED ORDER — TECHNETIUM TC 99M SESTAMIBI GENERIC - CARDIOLITE
11.0000 | Freq: Once | INTRAVENOUS | Status: AC | PRN
  Administered 2012-10-05: 11 via INTRAVENOUS

## 2012-10-05 NOTE — Progress Notes (Signed)
[facility] SITE 3 NUCLEAR MED [address] [id number] [place] [address] [phone]  Cardiology Nuclear Med Study  Russell Griffith is a 66 y.o. male     MRN : [id number]     DOB: 03-14-1946  Procedure Date: 10/05/2012  Nuclear Med Background Indication for Stress Test:  Evaluation for Ischemia, Graft Patency, and Atrial Ablation to be scheduled with Dr. Delena Serve in 12-2012 History:  '00 Cath: EF=60%, 3 vessel disease>CABG x 5, '03 Atrial Flutter Ablation,'11 Myocardial Perfusion Study-No ischemia or scar, EF=61%, 06-2012 Echo: EF=50-55%, mild AS, and 06-2012 Bradycardia in mid 30's with pauses Cardiac Risk Factors: Family History - CAD, Hypertension and Lipids  Symptoms:  Dizziness, DOE, Fatigue, Light-Headedness, Palpitations, Rapid HR and SOB   Nuclear Pre-Procedure Caffeine/Decaff Intake:  None > 12 hrs NPO After: 7:30pm   Lungs:  clear O2 Sat: 97% on room air. IV 0.9% NS with Angio Cath:  20g  IV Site: R Antecubital x 1, tolerated well IV Started by:  Irean Hong, RN  Chest Size (in):  44 Cup Size: n/a  Height: 5\' 11"  (1.803 m)  Weight:  193 lb (87.544 kg)  BMI:  Body mass index is 26.92 kg/(m^2). Tech Comments:  Took medications this am    Nuclear Med Study 1 or 2 day study: 1 day  Stress Test Type:  Stress  Reading MD: Charlton Haws, MD  Order Authorizing Provider:  Willa Rough, MD  Resting Radionuclide: Technetium 41m Sestamibi  Resting Radionuclide Dose: 11.0 mCi   Stress Radionuclide:  Technetium 79m Sestamibi  Stress Radionuclide Dose: 33.0 mCi           Stress Protocol Rest HR: 57 Stress HR: 150  Rest BP: 143/84 Stress BP: 216/96  Exercise Time (min): 10:55 METS: 13.2   Predicted Max HR: 156 bpm % Max HR: 96.15 bpm Rate Pressure Product: 33295   Dose of Adenosine (mg):  n/a Dose of Lexiscan: n/a mg  Dose of Atropine (mg): n/a Dose of Dobutamine: n/a mcg/kg/min (at max HR)  Stress Test Technologist: Irean Hong, RN  Nuclear  Technologist:  Doyne Keel, CNMT     Rest Procedure:  Myocardial perfusion imaging was performed at rest 45 minutes following the intravenous administration of Technetium 13m Sestamibi. Rest ECG: Sinus Bradycardia with 1st AVB and anterior T wave inversion, Atrial Pacing, Pvc's  Stress Procedure:  The patient performed treadmill exercise using a Bruce  Protocol for 10 minutes and 55 seconds, RPE=15.  The patient stopped due to DOE and denied any chest pain.  There were nonspecific ST-T wave changes. There was intermittent V-Pacing. Short interval of AFIB.There were frequent PVC's, Bigeminy, and PAC's.There was a hypertensive response to exercise.  Technetium 57m Sestamibi was injected at peak exercise and myocardial perfusion imaging was performed after a brief delay. Stress ECG: See impression  QPS Raw Data Images:  Normal; no motion artifact; normal heart/lung ratio. Stress Images:  Normal homogeneous uptake in all areas of the myocardium. Rest Images:  Normal homogeneous uptake in all areas of the myocardium. Subtraction (SDS):  Normal Transient Ischemic Dilatation (Normal <1.22):  1.15 Lung/Heart Ratio (Normal <0.45):  0.31  Quantitative Gated Spect Images QGS EDV:  n/a ml QGS ESV:  n/a ml  Impression Exercise Capacity:  Good exercise capacity. BP Response:  Hypertensive blood pressure response. Clinical Symptoms:  Lightheaded ECG Impression:  See impression Comparison with Prior Nuclear Study: No images to compare  Overall Impression:  Normal stress nuclear study. Patient had significant  arrhythmia.  Was atrially pacing and then after PVC hat PMT.  Have given  Strips to Dr Ladona Ridgel EP to see if PVARP or pacer needs to be reprogrammed  LV Ejection Fraction: Study not gated.  LV Wall Motion:  Not gated   .

## 2012-10-05 NOTE — Telephone Encounter (Signed)
  10/05/2012    RE: Russell Griffith DOB: 1946/05/23 MRN: [id number]   Dear Dr. Myrtis Ser,    We have scheduled the above patient for an endoscopic procedure. Our records show that he is on anticoagulation therapy.   Please advise as to how long the patient may come off his therapy of Eliquis prior to the procedure, which is scheduled for 11-01-2012.  Please fax back/ or route the completed form to Landry Dyke, [place] at [phone].   Sincerely,  Ok Anis

## 2012-10-05 NOTE — Progress Notes (Signed)
i agree with the plan outlined in this note 

## 2012-10-05 NOTE — Patient Instructions (Signed)
You have been scheduled for an endoscopy with Dr. Christella Hartigan. Please follow written instructions given to you at your visit today. If you use inhalers (even only as needed) or a CPAP machine, please bring them with you on the day of your procedure.

## 2012-10-05 NOTE — Progress Notes (Signed)
Subjective:    Patient ID: Russell Griffith, male    DOB: 1946/08/03, 66 y.o.   MRN: [id number]  HPI Russell Griffith is a 66 year old white male known to Dr. Christella Hartigan with history of Barrett's esophagus, and adenomatous colon polyps. Last colonoscopy was done in May of 2011, he had one 3 mm polyp removed at that time was also noted to have diverticulosis. Path on the polyp was consistent with an adenoma and he is recommended for her 5 year follow up. Last EGD was also done in May of 2011 4 history of short segment Barrett's. EGD in 2009 did not confirm intestinal metaplasia and EGD in May of 2011 nodular Barrett's mucosa but again biopsies without any evidence of intestinal metaplasia. Decision was made for relook in 2-1/2 years . He does have history of coronary artery disease and atrial fibrillation. He had pacemaker placed in August of 2013 at this time is being maintained on Eliquis He is taking Nexium 40 mg by mouth daily and states that he has no problems with heartburn or indigestion no dysphagia oral dying aphasia. He has not had any recent change in his bowel habits occasionally does see a scant amount of blood on the tissue which she attributes to hemorrhoids. He mentions that he tried to wean himself off of the Nexium but had significant increase in his heartburn symptoms and had to go back on daily Nexium.He is interested to know if there's anything else that he can try  to help him get off of the Nexium as he is concerned because he has been on it for many years.  Comes in today to schedule followup EGD with biopsy    Review of Systems  Constitutional: Negative.   HENT: Negative.   Eyes: Negative.   Respiratory: Negative.   Cardiovascular: Negative.   Gastrointestinal: Negative.   Genitourinary: Negative.   Musculoskeletal: Negative.   Skin: Negative.   Neurological: Negative.   Hematological: Negative.   Psychiatric/Behavioral: Negative.    Outpatient Prescriptions Prior to Visit    Medication Sig Dispense Refill  . apixaban (ELIQUIS) 5 MG TABS tablet Take 1 tablet (5 mg total) by mouth 2 (two) times daily.  60 tablet  11  . atorvastatin (LIPITOR) 20 MG tablet Take 1 tablet (20 mg total) by mouth daily.  90 tablet  3  . Calcium & Magnesium Carbonates (MYLANTA PO) Take 10 mg by mouth daily as needed.       . cyclobenzaprine (FLEXERIL) 5 MG tablet Take 1 tablet (5 mg total) by mouth every 8 (eight) hours as needed for muscle spasms.  30 tablet  1  . dofetilide (TIKOSYN) 125 MCG capsule Take 1 capsule (125 mcg total) by mouth 2 (two) times daily.  180 capsule  2  . Multiple Vitamin (MULTIVITAMIN WITH MINERALS) TABS Take 1 tablet by mouth daily.      Russell Griffith NEXIUM 40 MG capsule TAKE 1 CAPSULE ONCE DAILY BEFORE BREAKFAST.  30 capsule  11  . potassium chloride (MICRO-K) 10 MEQ CR capsule Take 10 mEq by mouth daily.      . ramipril (ALTACE) 2.5 MG capsule Take 1 capsule (2.5 mg total) by mouth daily.  30 capsule  5  . valACYclovir (VALTREX) 1000 MG tablet Take 1,000 mg by mouth daily as needed. FOR FEVER BLISTER       No facility-administered medications prior to visit.   Allergies  Allergen Reactions  . Sulfonamide Derivatives     "have no idea; mother told me  I was allergic to"   Patient Active Problem List  Diagnosis  . ADENOMATOUS COLONIC POLYP  . THYROID STIMULATING HORMONE, ABNORMAL  . ANXIETY  . ANXIETY DEPRESSION  . PREMATURE VENTRICULAR CONTRACTIONS  . HEMORRHOIDS  . ESOPHAGITIS  . ESOPHAGEAL STENOSIS  . GERD  . BARRETTS ESOPHAGUS  . GASTRITIS, CHRONIC  . DUODENITIS  . HIATAL HERNIA  . DIVERTICULOSIS, COLON  . BENIGN PROSTATIC HYPERTROPHY, WITH OBSTRUCTION  . OSTEOARTHRITIS, KNEE, LEFT  . DERANGEMENT OF MENISCUS NOT ELSEWHERE CLASSIFIED  . Knee pain  . Unspecified disorders of bursae and tendons in shoulder region  . FOOT PAIN, LEFT  . OTHER CONGENITAL VARUS DEFORMITY OF FEET  . COLONIC POLYPS, HX OF  . PROSTATITIS, HX OF  . Bradycardia  . PVC's  (premature ventricular contractions)  . Carotid artery disease  . Elevated bilirubin  . Palpitations  . Ejection fraction  . QT prolongation on Tikosyn  . A-fib  . Hypokalemia  . Pulmonic stenosis  . Aortic insufficiency  . Warfarin anticoagulation  . Drug therapy  . HTN (hypertension)  . Hyperlipidemia  . Drug intolerance  . Numbness of toes  . CAD (coronary artery disease)  . Hx of CABG  . Encounter for long-term (current) use of anticoagulants  . Tumescence  . Plantar fasciitis of right foot  . Routine health maintenance  . Shortness of breath  . Nausea  . Aortic stenosis  . Lung granuloma  . Sinus node dysfunction/chronotropic incompetence/posttermination pausing  . PPM-[employer]  . Chronic anticoagulation       Objective:   Physical Exam well-developed white male in no acute distress, pleasant blood pressure 126/72 pulse 60 height 5 foot 10 weight 192. Not further examined today discussion only        Assessment & Plan:  #77 66 year old white male on chronic anticoagulation with Eliquis for atrial fibrillation. Patient states he anticipates having an ablation done in February of 2014 #2 coronary artery disease #3 status post pacemaker placement August 2013 #4 history of Barrett's esophagus-due for followup screening, last EGD May 2011 with no evidence of intestinal metaplasia. #5 chronic GERD #6 history of adenomatous colon polyps-last colonoscopy May 2011 with one 3 mm adenoma removed-recommended for 5 year followup #7 diverticulosis  Plan; patient will continue Nexium 40 mg by mouth daily for now Schedule for EGD with esophageal biopsies with Dr. Christella Hartigan. Procedure was discussed in detail with the patient and he is agreeable to proceed. He will need to come off of his liquids for 48 hours prior to the procedure, and we will obtain consent from his cardiologist Dr. Myrtis Ser.

## 2012-10-10 ENCOUNTER — Encounter: Payer: Self-pay | Admitting: Cardiology

## 2012-10-10 ENCOUNTER — Telehealth: Payer: Self-pay | Admitting: *Deleted

## 2012-10-10 NOTE — Telephone Encounter (Signed)
Procedure is scheduled for Nov 01, 2012.

## 2012-10-10 NOTE — Telephone Encounter (Signed)
The patient can hold Eliquis for 7 days prior to his GI procedure. It should be restarted  Soon after the procedure when appropriate as determined by the gastroenterologist.

## 2012-10-10 NOTE — Progress Notes (Signed)
   The patient has had a stress nuclear scan. There is no scar or ischemia. Dr. Delena Serve wanted this data before proceeding with atrial fibrillation. I will arrange for this data to be sent to Dr. Delena Serve.

## 2012-10-10 NOTE — Telephone Encounter (Signed)
Called patient and told patient that per Dr. Myrtis Ser ok for patient to hold Eliquis 7 days prior to procedure..  Patient verbalized understanding

## 2012-10-18 ENCOUNTER — Other Ambulatory Visit: Payer: Self-pay | Admitting: *Deleted

## 2012-10-18 MED ORDER — RAMIPRIL 2.5 MG PO CAPS: 2.5000 mg | ORAL_CAPSULE | Freq: Every day | ORAL | Status: DC

## 2012-10-24 ENCOUNTER — Ambulatory Visit (INDEPENDENT_AMBULATORY_CARE_PROVIDER_SITE_OTHER): Payer: 59 | Admitting: Internal Medicine

## 2012-10-24 ENCOUNTER — Encounter: Payer: Self-pay | Admitting: Internal Medicine

## 2012-10-24 VITALS — BP 130/71 | HR 55 | Ht 71.0 in | Wt 197.8 lb

## 2012-10-24 DIAGNOSIS — F411 Generalized anxiety disorder: Secondary | ICD-10-CM

## 2012-10-24 DIAGNOSIS — R9431 Abnormal electrocardiogram [ECG] [EKG]: Secondary | ICD-10-CM | POA: Insufficient documentation

## 2012-10-24 DIAGNOSIS — I495 Sick sinus syndrome: Secondary | ICD-10-CM

## 2012-10-24 DIAGNOSIS — R001 Bradycardia, unspecified: Secondary | ICD-10-CM

## 2012-10-24 DIAGNOSIS — I498 Other specified cardiac arrhythmias: Secondary | ICD-10-CM

## 2012-10-24 DIAGNOSIS — I4891 Unspecified atrial fibrillation: Secondary | ICD-10-CM

## 2012-10-24 DIAGNOSIS — Z950 Presence of cardiac pacemaker: Secondary | ICD-10-CM

## 2012-10-24 LAB — PACEMAKER DEVICE OBSERVATION
AL IMPEDENCE PM: 599 Ohm
AL THRESHOLD: 0.4 V
RV LEAD AMPLITUDE: 25 mv
RV LEAD IMPEDENCE PM: 606 Ohm
RV LEAD THRESHOLD: 0.9 V

## 2012-10-24 NOTE — Assessment & Plan Note (Addendum)
Diffuse anterior T-wave inversions may well be from ventricular pacing and T-wave memory. We will reprogram his device to the DDI mode so as to prevent ventricular pacing and reassess. In the event that they persist, undertake catheterization not withstanding his negative Myoview  We spent more than 30 minutes on the above

## 2012-10-24 NOTE — Assessment & Plan Note (Signed)
Still with intermittent atrial fibrillation. QT interval okay

## 2012-10-24 NOTE — Assessment & Plan Note (Signed)
Pacemaker was programmed to DDI mode.

## 2012-10-24 NOTE — Progress Notes (Signed)
skf Patient Care Team: Jacques Navy, MD as PCP - General Luis Abed, MD (Cardiology) Enid Baas, MD (Family Medicine) Duke Salvia, MD (Cardiology)   HPI  Russell Griffith is a 66 y.o. male Seen in followup for pacemaker implanted for significant pausing in the context of intermittent atrial fibrillation. He underwent pacemaker implantation 8/13. He is also also scheduled to undergo pulmonary vein isolation by Dr. Delena Serve down at [facility] February 2014 He has a history of bypass surgery in 2000. Myoview November 2013 demonstrated no ischemia or scar.  He also has had problems with pacemaker mediated tachycardia (PMT) which was adjusted by program his PVARP     he is doing quite well. Without significant symptoms.   Past Medical History  Diagnosis Date  . CAD (coronary artery disease)     Myoview March, 2011, excellent exercise, hypertensive response, no scar or ischemia, EF 61%, mild palpitations peak stress with infrequent PACs and PVCs.  Marland Divis Hyperlipidemia     Low HDL  . GERD (gastroesophageal reflux disease)     Barrett's esophagus  . Colonic polyp   . Palpitations      exercise palpitations, probably A. fib-not VT,Pindolol not tolerated, CAD, bradycardia,Tickosyn needs hospital +/_ side effects, amiodarone & sotalol could need pacer,Multaq less effective not tried, atrial fib ablation could be considered at low risk  . Foot pain     Chronic lateral foot pain-resolved with use of orthotics '09  . Aortic stenosis     Mild, echo, April, 2012  . Carotid artery disease     Doppler November, 2010, 0-39% bilateral followup 2 years  . Warfarin anticoagulation     Coumadin for atrial fib  . Aortic insufficiency      Mild, echo, 2010 / mild/moderate, echo, 2012  . Elevated bilirubin     Mild chronic elevation, 2.0 January, 2011 stable  . BPH (benign prostatic hyperplasia)   . Drug intolerance     Pindolol before exercise not tolerated  . Ejection fraction     EF 60-65%,  echo, September, 2010 / EF 60-65% echo, April, 2012  . Hypokalemia     Potassium 3.6 before potassium started April, 2012  . Hx of colonoscopy   . Chronic foot pain 2009    Resolved with use of orthotics  . Intolerance of drug     Fish oil, bilirubin, nacin  . BPH (benign prostatic hyperplasia)   . Pulmonic stenosis     Mild, echo, April, 2012  . Drug therapy     Tikosyn April, 2012  . TSH (thyroid-stimulating hormone deficiency)   . Hemorrhoids     September, 2012  . Numbness of toes     Slight numbness of one toe, September, 2012  . Lung granuloma     Left  lung chest x-ray July, 2013  . A-fib      Admission for Tikosyn load, March 20, 2011, converted after first dose, QTC 440 at the time of discharge. / Office note March 27, 2011, clinically in and out of atrial, QTC 500 ms, Dr.Navada Osterhout decreased Tikosyn to 375mg  BID.  Atrial fib at rest February 25, 2011 / plan to admit for [facility] therapy  . PVC's (premature ventricular contractions)   . Atrial flutter     Status post ablation  . Arrhythmia     h/o bradycardia  . HTN (hypertension)     Controlled at rest, hypertension on treadmill, sensitive to medications  . Heart murmur     "  since I was a little boy"  . MVP (mitral valve prolapse)   . Anginal pain 2000    "before bypass"  . Shortness of breath 07/15/2012    "can happen at any time; related to AF and bradycardia"  . Barrett's esophagus   . Migraines 07/15/2012    "years ago; before glasses changed"  . Primary osteoarthritis of left knee     Mild    Past Surgical History  Procedure Date  . Insert / replace / remove pacemaker 07/15/2012    initial placement  . Tonsillectomy 1956  . Coronary artery bypass graft 2000    CABG X5    Current Outpatient Prescriptions  Medication Sig Dispense Refill  . apixaban (ELIQUIS) 5 MG TABS tablet Take 1 tablet (5 mg total) by mouth 2 (two) times daily.  60 tablet  11  . atorvastatin (LIPITOR) 20 MG tablet Take 1 tablet (20 mg  total) by mouth daily.  90 tablet  3  . Calcium & Magnesium Carbonates (MYLANTA PO) Take 10 mg by mouth daily as needed.       . dofetilide (TIKOSYN) 125 MCG capsule Take 125 mcg by mouth 3 (three) times daily.      . Multiple Vitamin (MULTIVITAMIN WITH MINERALS) TABS Take 1 tablet by mouth daily.      Marland Lucia NEXIUM 40 MG capsule TAKE 1 CAPSULE ONCE DAILY BEFORE BREAKFAST.  30 capsule  11  . potassium chloride (MICRO-K) 10 MEQ CR capsule Take 10 mEq by mouth daily.      . ramipril (ALTACE) 2.5 MG capsule Take 1 capsule (2.5 mg total) by mouth daily.  30 capsule  5  . valACYclovir (VALTREX) 1000 MG tablet Take 1,000 mg by mouth daily as needed. FOR FEVER BLISTER      . [DISCONTINUED] dofetilide (TIKOSYN) 125 MCG capsule Take 1 capsule (125 mcg total) by mouth 2 (two) times daily.  180 capsule  2    Allergies  Allergen Reactions  . Sulfonamide Derivatives     "have no idea; mother told me I was allergic to"    Review of Systems negative except from HPI and PMH  Physical Exam BP 130/71  Pulse 55  Ht 5\' 11"  (1.803 m)  Wt 197 lb 12.8 oz (89.721 kg)  BMI 27.59 kg/m2 Well developed and well nourished in no acute distress HENT normal E scleral and icterus clear Neck Supple JVP flat; carotids brisk and full Clear to ausculation  *Regular rate and rhythm, no murmurs gallops or rub Soft with active bowel sounds No clubbing cyanosis none Edema Alert and oriented, grossly normal motor and sensory function Skin Warm and Dry   fluid for him demonstrates atrial pacing at 55 Intervals 22/09/42 Axis XXXIV T wave inversions V1-V6 New since August 2013   Assessment and  Plan

## 2012-10-24 NOTE — Assessment & Plan Note (Signed)
Stable post pacing with some degree of chronotropic competence

## 2012-10-24 NOTE — Patient Instructions (Addendum)
Your physician wants you to follow-up in: August of 2014 with Dr. Graciela Husbands. You will receive a reminder letter in the mail two months in advance. If you don't receive a letter, please call our office to schedule the follow-up appointment.  Your physician recommends that you schedule a follow-up appointment in: 3 weeks with Dr. Myrtis Ser.

## 2012-11-01 ENCOUNTER — Ambulatory Visit (AMBULATORY_SURGERY_CENTER): Payer: 59 | Admitting: Gastroenterology

## 2012-11-01 ENCOUNTER — Other Ambulatory Visit: Payer: 59 | Admitting: Gastroenterology

## 2012-11-01 ENCOUNTER — Encounter: Payer: Self-pay | Admitting: Gastroenterology

## 2012-11-01 VITALS — BP 119/63 | HR 55 | Temp 97.9°F | Resp 17 | Ht 71.0 in | Wt 192.0 lb

## 2012-11-01 DIAGNOSIS — K2270 Barrett's esophagus without dysplasia: Secondary | ICD-10-CM

## 2012-11-01 DIAGNOSIS — K2960 Other gastritis without bleeding: Secondary | ICD-10-CM

## 2012-11-01 DIAGNOSIS — R933 Abnormal findings on diagnostic imaging of other parts of digestive tract: Secondary | ICD-10-CM

## 2012-11-01 MED ORDER — SODIUM CHLORIDE 0.9 % IV SOLN: 500.0000 mL | INTRAVENOUS | Status: DC

## 2012-11-01 NOTE — Op Note (Signed)
 [facility] [address] [place], [address]   ENDOSCOPY PROCEDURE REPORT  PATIENT: [name], Russell Griffith  MR#: [id number] BIRTHDATE: Apr 16, 1946 , 66  yrs. old GENDER: Male ENDOSCOPIST: Rachael Fee, MD PROCEDURE DATE:  11/01/2012 PROCEDURE:  EGD w/ biopsy ASA CLASS:     Class II INDICATIONS:  History of Barrett's esophagus.  IM but no dysplasia seen on serial endoscopies 2004, 2005, 2007.  EGD 2009 found short segment of Barrett's appearing mucosa, biopsies did not confirm intestinal metaplasia however.   EGD may 2011 also found short segment, non-nodular Barrett's appearing mucosa however biopsies again showed no intestinal metaplasia.  Recall EGD recommended at 2-1/2 years. MEDICATIONS: Fentanyl 50 mcg IV and Versed 6 mg IV TOPICAL ANESTHETIC: Cetacaine Spray  DESCRIPTION OF PROCEDURE: After the risks benefits and alternatives of the procedure were thoroughly explained, informed consent was obtained.  The LB [id number] endoscope was introduced through the mouth and advanced to the second portion of the duodenum. Without limitations.  The instrument was slowly withdrawn as the mucosa was fully examined.    There was an irregular, non-nodular Z line, somewhat Barrett's appearing mucosa.  This was extensively biopsied and sent to pathology.  The examination was otherwise normal.  Retroflexed views revealed no abnormalities.     The scope was then withdrawn from the patient and the procedure completed. COMPLICATIONS: There were no complications.  ENDOSCOPIC IMPRESSION: There was an irregular Z line, somewhat Barrett's appearing mucosa. This was extensively biopsied and sent to pathology.  The examination was otherwise normal.  RECOMMENDATIONS: Await final pathology.   eSigned:  Rachael Fee, MD 11/01/2012 3:19 PM   CC:  Wyonia Hough, MD

## 2012-11-01 NOTE — Patient Instructions (Addendum)
YOU HAD AN ENDOSCOPIC PROCEDURE TODAY AT THE [facility]: Refer to the procedure report that was given to you for any specific questions about what was found during the examination.  If the procedure report does not answer your questions, please call your gastroenterologist to clarify.  If you requested that your care partner not be given the details of your procedure findings, then the procedure report has been included in a sealed envelope for you to review at your convenience later.  YOU SHOULD EXPECT: Some feelings of bloating in the abdomen. Passage of more gas than usual.  Walking can help get rid of the air that was put into your GI tract during the procedure and reduce the bloating. If you had a lower endoscopy (such as a colonoscopy or flexible sigmoidoscopy) you may notice spotting of blood in your stool or on the toilet paper. If you underwent a bowel prep for your procedure, then you may not have a normal bowel movement for a few days.  DIET: Your first meal following the procedure should be a light meal and then it is ok to progress to your normal diet.  A half-sandwich or bowl of soup is an example of a good first meal.  Heavy or fried foods are harder to digest and may make you feel nauseous or bloated.  Likewise meals heavy in dairy and vegetables can cause extra gas to form and this can also increase the bloating.  Drink plenty of fluids but you should avoid alcoholic beverages for 24 hours.  ACTIVITY: Your care partner should take you home directly after the procedure.  You should plan to take it easy, moving slowly for the rest of the day.  You can resume normal activity the day after the procedure however you should NOT DRIVE or use heavy machinery for 24 hours (because of the sedation medicines used during the test).    SYMPTOMS TO REPORT IMMEDIATELY: A gastroenterologist can be reached at any hour.  During normal business hours, 8:30 AM to 5:00 PM Monday through Friday,  call [phone].  After hours and on weekends, please call the GI answering service at [phone] who will take a message and have the physician on call contact you.    Following upper endoscopy (EGD)  Vomiting of blood or coffee ground material  New chest pain or pain under the shoulder blades  Painful or persistently difficult swallowing  New shortness of breath  Fever of 100F or higher  Black, tarry-looking stools  FOLLOW UP: If any biopsies were taken you will be contacted by phone or by letter within the next 1-3 weeks.  Call your gastroenterologist if you have not heard about the biopsies in 3 weeks.  Our staff will call the home number listed on your records the next business day following your procedure to check on you and address any questions or concerns that you may have at that time regarding the information given to you following your procedure. This is a courtesy call and so if there is no answer at the home number and we have not heard from you through the emergency physician on call, we will assume that you have returned to your regular daily activities without incident.  SIGNATURES/CONFIDENTIALITY: You and/or your care partner have signed paperwork which will be entered into your electronic medical record.  These signatures attest to the fact that that the information above on your After Visit Summary has been reviewed and is understood.  Full   responsibility of the confidentiality of this discharge information lies with you and/or your care-partner.   Resume medications.Information given on barrett's with discharge instructions.

## 2012-11-01 NOTE — Progress Notes (Signed)
Patient did not experience any of the following events: a burn prior to discharge; a fall within the facility; wrong site/side/patient/procedure/implant event; or a hospital transfer or hospital admission upon discharge from the facility. (G8907) Patient did not have preoperative order for IV antibiotic SSI prophylaxis. (G8918)  

## 2012-11-02 ENCOUNTER — Telehealth: Payer: Self-pay | Admitting: *Deleted

## 2012-11-02 NOTE — Telephone Encounter (Signed)
  Follow up Call-  Call back number 11/01/2012  Post procedure Call Back phone  # [phone] has 7:30 meeting ok to speak with wife  Permission to leave phone message Yes     Patient questions:  Do you have a fever, pain , or abdominal swelling? no Pain Score  0 *  Have you tolerated food without any problems? yes  Have you been able to return to your normal activities? yes  Do you have any questions about your discharge instructions: Diet   no Medications  no Follow up visit  no  Do you have questions or concerns about your Care? no  Actions: * If pain score is 4 or above: No action needed, pain <4. Spoke with wife who states pt did great, he has been at work for over an hour, he ate yest with no problems. ewm

## 2012-11-08 ENCOUNTER — Encounter: Payer: Self-pay | Admitting: Gastroenterology

## 2012-11-14 ENCOUNTER — Encounter: Payer: Self-pay | Admitting: Cardiology

## 2012-11-14 ENCOUNTER — Ambulatory Visit (INDEPENDENT_AMBULATORY_CARE_PROVIDER_SITE_OTHER): Payer: 59 | Admitting: Cardiology

## 2012-11-14 VITALS — BP 116/60 | HR 55 | Ht 71.0 in | Wt 198.0 lb

## 2012-11-14 DIAGNOSIS — Z950 Presence of cardiac pacemaker: Secondary | ICD-10-CM

## 2012-11-14 DIAGNOSIS — I2510 Atherosclerotic heart disease of native coronary artery without angina pectoris: Secondary | ICD-10-CM

## 2012-11-14 DIAGNOSIS — I4891 Unspecified atrial fibrillation: Secondary | ICD-10-CM

## 2012-11-14 DIAGNOSIS — R9431 Abnormal electrocardiogram [ECG] [EKG]: Secondary | ICD-10-CM

## 2012-11-14 NOTE — Patient Instructions (Addendum)
Your physician recommends that you schedule a follow-up appointment in: early March

## 2012-11-14 NOTE — Progress Notes (Signed)
HPI  Patient is seen to followup atrial fibrillation and coronary artery disease. He's actually doing well. He had seen Dr. Graciela Husbands. His resting EKG showed diffuse T wave changes. It was felt this might be related to his pacing function. His pacemaker was adjusted and he is now here for followup.  He still gets some episodes of atrial fibrillation. He's not having any chest pain or shortness of breath.  Allergies  Allergen Reactions  . Sulfonamide Derivatives     "have no idea; mother told me I was allergic to"    Current Outpatient Prescriptions  Medication Sig Dispense Refill  . apixaban (ELIQUIS) 5 MG TABS tablet Take 1 tablet (5 mg total) by mouth 2 (two) times daily.  60 tablet  11  . atorvastatin (LIPITOR) 20 MG tablet Take 1 tablet (20 mg total) by mouth daily.  90 tablet  3  . Calcium & Magnesium Carbonates (MYLANTA PO) Take 10 mg by mouth daily as needed.       . dofetilide (TIKOSYN) 125 MCG capsule Take 125 mcg by mouth 3 (three) times daily.      . Multiple Vitamin (MULTIVITAMIN WITH MINERALS) TABS Take 1 tablet by mouth daily.      Marland Laury NEXIUM 40 MG capsule TAKE 1 CAPSULE ONCE DAILY BEFORE BREAKFAST.  30 capsule  11  . potassium chloride (MICRO-K) 10 MEQ CR capsule Take 10 mEq by mouth daily.      . ramipril (ALTACE) 2.5 MG capsule Take 1 capsule (2.5 mg total) by mouth daily.  30 capsule  5  . valACYclovir (VALTREX) 1000 MG tablet Take 1,000 mg by mouth daily as needed. FOR FEVER BLISTER        History   Social History  . Marital Status: Married    Spouse Name: N/A    Number of Children: 2  . Years of Education: 18   Occupational History  . [employer]   Social History Main Topics  . Smoking status: Never Smoker   . Smokeless tobacco: Never Used  . Alcohol Use: 12.6 oz/week    21 Glasses of wine per week  . Drug Use: No  . Sexually Active: Yes -- Male partner(s)   Other Topics Concern  . Not on file   Social History  Narrative   [employer] [place], [place].  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  2 daughters-'75, '79; 1 grandchild; step-daughter and step grandson.  Regular exercise-yes, runs 1.5-2 mi 4x/wk, also elipticalPatient signed a Designated Party Release to allow his wife, Elvis Laufer, to have access to his medical records/ information.    Family History  Problem Relation Age of Onset  . Hyperlipidemia    . Hypertension    . Coronary artery disease Other 72  . Diabetes Other   . Colon cancer Neg Hx   . Multiple myeloma Mother   . Diabetes Father   . Renal Disease Father     Past Medical History  Diagnosis Date  . CAD (coronary artery disease)     Myoview March, 2011, excellent exercise, hypertensive response, no scar or ischemia, EF 61%, mild palpitations peak stress with infrequent PACs and PVCs.  Marland Fessenden Hyperlipidemia     Low HDL  . GERD (gastroesophageal reflux disease)     Barrett's esophagus  . Colonic polyp   . Palpitations      exercise palpitations, probably A. fib-not VT,Pindolol not tolerated, CAD, bradycardia,Tickosyn needs hospital +/  _ side effects, amiodarone & sotalol could need pacer,Multaq less effective not tried, atrial fib ablation could be considered at low risk  . Foot pain     Chronic lateral foot pain-resolved with use of orthotics '09  . Aortic stenosis     Mild, echo, April, 2012  . Carotid artery disease     Doppler November, 2010, 0-39% bilateral followup 2 years  . Warfarin anticoagulation     Coumadin for atrial fib  . Aortic insufficiency      Mild, echo, 2010 / mild/moderate, echo, 2012  . Elevated bilirubin     Mild chronic elevation, 2.0 January, 2011 stable  . BPH (benign prostatic hyperplasia)   . Drug intolerance     Pindolol before exercise not tolerated  . Ejection fraction     EF 60-65%, echo, September, 2010 / EF 60-65% echo, April, 2012  . Hypokalemia     Potassium 3.6 before potassium started  April, 2012  . Hx of colonoscopy   . Chronic foot pain 2009    Resolved with use of orthotics  . Intolerance of drug     Fish oil, bilirubin, nacin  . BPH (benign prostatic hyperplasia)   . Pulmonic stenosis     Mild, echo, April, 2012  . Drug therapy     Tikosyn April, 2012  . TSH (thyroid-stimulating hormone deficiency)   . Hemorrhoids     September, 2012  . Numbness of toes     Slight numbness of one toe, September, 2012  . Lung granuloma     Left  lung chest x-ray July, 2013  . A-fib      Admission for Tikosyn load, March 20, 2011, converted after first dose, QTC 440 at the time of discharge. / Office note March 27, 2011, clinically in and out of atrial, QTC 500 ms, Dr.Klein decreased Tikosyn to 375mg  BID.  Atrial fib at rest February 25, 2011 / plan to admit for [facility] therapy  . PVC's (premature ventricular contractions)   . Atrial flutter     Status post ablation  . Arrhythmia     h/o bradycardia  . HTN (hypertension)     Controlled at rest, hypertension on treadmill, sensitive to medications  . Heart murmur     "since I was a little boy"  . MVP (mitral valve prolapse)   . Anginal pain 2000    "before bypass"  . Shortness of breath 07/15/2012    "can happen at any time; related to AF and bradycardia"  . Barrett's esophagus   . Migraines 07/15/2012    "years ago; before glasses changed"  . Primary osteoarthritis of left knee     Mild    Past Surgical History  Procedure Date  . Insert / replace / remove pacemaker 07/15/2012    initial placement  . Tonsillectomy 1956  . Coronary artery bypass graft 2000    CABG X5  . Radiofrequency  ablation for heart arrythmia     Patient Active Problem List  Diagnosis  . ADENOMATOUS COLONIC POLYP  . THYROID STIMULATING HORMONE, ABNORMAL  . ANXIETY  . ANXIETY DEPRESSION  . PREMATURE VENTRICULAR CONTRACTIONS  . HEMORRHOIDS  . ESOPHAGITIS  . ESOPHAGEAL STENOSIS  . GERD  . BARRETTS ESOPHAGUS  . GASTRITIS, CHRONIC  .  DUODENITIS  . DIVERTICULOSIS, COLON  . BENIGN PROSTATIC HYPERTROPHY, WITH OBSTRUCTION  . OSTEOARTHRITIS, KNEE, LEFT  . DERANGEMENT OF MENISCUS NOT ELSEWHERE CLASSIFIED  . Knee pain  . Unspecified disorders of bursae and  tendons in shoulder region  . FOOT PAIN, LEFT  . OTHER CONGENITAL VARUS DEFORMITY OF FEET  . COLONIC POLYPS, HX OF  . PROSTATITIS, HX OF  . PVC's (premature ventricular contractions)  . Carotid artery disease  . Elevated bilirubin  . QT prolongation on Tikosyn  . A-fib  . Hypokalemia  . Pulmonic stenosis  . Aortic insufficiency  . Oral anticoagulation  . Drug therapy  . HTN (hypertension)  . Hyperlipidemia  . Drug intolerance  . Numbness of toes  . CAD (coronary artery disease)  . Hx of CABG  . Tumescence  . Plantar fasciitis of right foot  . Routine health maintenance  . Shortness of breath  . Nausea  . Aortic stenosis  . Lung granuloma  . Sinus node dysfunction/chronotropic incompetence/posttermination pausing  . PPM-[employer]  . Diffuse anterior T-wave inversions    ROS   Patient denies fever, chills, headache rash, change in vision, change in hearing, chest pain, cough, nausea vomiting, urinary symptoms. All other systems are reviewed and are negative.  PHYSICAL EXAM  Patient is oriented to person time and place. Affect is normal. There is no jugular venous distention. Lungs are clear. Respiratory effort is nonlabored. His pacemaker site looks quite good. Abdomen is soft. Is no peripheral edema.  Filed Vitals:   11/14/12 0946  BP: 116/60  Pulse: 55  Height: 5\' 11"  (1.803 m)  Weight: 198 lb (89.812 kg)   EKG is done today and reviewed by me. He has mild anterior ST changes. This tracing looks very similar to older tracings in the past.  ASSESSMENT & PLAN

## 2012-11-14 NOTE — Assessment & Plan Note (Signed)
Pacemaker site looks quite good. The patient mentioned that he is doing on that area about the pacemaker might have moved. On examination there is no abnormality. It appears that we do not need to interrogated today.

## 2012-11-14 NOTE — Assessment & Plan Note (Signed)
His current EKG is stable. It is a totally paced. The ST segments are improved. This suggested the changes were related to pacing. They probably did not represent ischemia. He had a recent nuclear scan revealing no ischemia. No further workup.

## 2012-11-14 NOTE — Assessment & Plan Note (Signed)
EKG looks better today after his pacemaker adjustment. I'll review with Dr. Graciela Husbands. It would appear that he does not need any further workup before his atrial fibrillation.

## 2012-11-14 NOTE — Assessment & Plan Note (Signed)
The patient will have an ablation done in February, 2014.

## 2012-12-23 ENCOUNTER — Other Ambulatory Visit: Payer: Self-pay | Admitting: Internal Medicine

## 2012-12-26 NOTE — Telephone Encounter (Signed)
Will forward to [place] and [place]

## 2012-12-27 ENCOUNTER — Telehealth: Payer: Self-pay | Admitting: Cardiology

## 2012-12-27 NOTE — Telephone Encounter (Signed)
All information confirmed pt dose take 3 capsules BID because the company dose not make a 375 mcg and he needs it called into [facility] and it needs to be a 30 day supply

## 2012-12-27 NOTE — Telephone Encounter (Signed)
New Problem:    Patient's wife called in needing a refill of his dofetilide (TIKOSYN) 125 MCG capsule.  Claims that the patient is taking six pills a day.

## 2012-12-27 NOTE — Telephone Encounter (Signed)
Pt's wife calling back re tikosyn , dosage is whatever [facility] has on file

## 2012-12-27 NOTE — Telephone Encounter (Signed)
I left a message at the home and cell numbers to call back and verify that the patient is taking tikosyn 125 mcg three capsules twice daily. Also to verify [facility] as the pharmacy and if he needs a 90 day supply.

## 2012-12-28 NOTE — Telephone Encounter (Signed)
See refill note for additional documentation. Refill has been sent and I spoke with pharmacist at [facility] and confirmed they have refill.  I spoke with pt's wife and gave her this information.

## 2012-12-28 NOTE — Telephone Encounter (Signed)
New Problem:    Patient's wife is following up on her previous calls about her husbands medication.  Please call back.

## 2012-12-29 ENCOUNTER — Other Ambulatory Visit: Payer: Self-pay

## 2012-12-29 MED ORDER — DOFETILIDE 125 MCG PO CAPS: 125.0000 ug | ORAL_CAPSULE | Freq: Two times a day (BID) | ORAL | Status: DC

## 2013-01-14 ENCOUNTER — Other Ambulatory Visit: Payer: Self-pay

## 2013-01-19 DIAGNOSIS — Z950 Presence of cardiac pacemaker: Secondary | ICD-10-CM | POA: Diagnosis not present

## 2013-01-19 DIAGNOSIS — I2510 Atherosclerotic heart disease of native coronary artery without angina pectoris: Secondary | ICD-10-CM | POA: Diagnosis not present

## 2013-01-19 DIAGNOSIS — I4892 Unspecified atrial flutter: Secondary | ICD-10-CM | POA: Diagnosis not present

## 2013-01-19 DIAGNOSIS — Z951 Presence of aortocoronary bypass graft: Secondary | ICD-10-CM | POA: Diagnosis not present

## 2013-01-19 DIAGNOSIS — I499 Cardiac arrhythmia, unspecified: Secondary | ICD-10-CM | POA: Diagnosis not present

## 2013-01-19 DIAGNOSIS — I471 Supraventricular tachycardia: Secondary | ICD-10-CM | POA: Diagnosis not present

## 2013-01-19 DIAGNOSIS — M1990 Unspecified osteoarthritis, unspecified site: Secondary | ICD-10-CM | POA: Diagnosis not present

## 2013-01-19 DIAGNOSIS — I4891 Unspecified atrial fibrillation: Secondary | ICD-10-CM | POA: Diagnosis not present

## 2013-01-19 DIAGNOSIS — Z79899 Other long term (current) drug therapy: Secondary | ICD-10-CM | POA: Diagnosis not present

## 2013-01-19 DIAGNOSIS — E785 Hyperlipidemia, unspecified: Secondary | ICD-10-CM | POA: Diagnosis not present

## 2013-01-19 DIAGNOSIS — I491 Atrial premature depolarization: Secondary | ICD-10-CM | POA: Diagnosis not present

## 2013-01-19 DIAGNOSIS — R300 Dysuria: Secondary | ICD-10-CM | POA: Diagnosis not present

## 2013-01-20 DIAGNOSIS — Z951 Presence of aortocoronary bypass graft: Secondary | ICD-10-CM | POA: Diagnosis not present

## 2013-01-20 DIAGNOSIS — I4892 Unspecified atrial flutter: Secondary | ICD-10-CM | POA: Diagnosis not present

## 2013-01-20 DIAGNOSIS — I471 Supraventricular tachycardia: Secondary | ICD-10-CM | POA: Diagnosis not present

## 2013-01-20 DIAGNOSIS — E785 Hyperlipidemia, unspecified: Secondary | ICD-10-CM | POA: Diagnosis not present

## 2013-01-20 DIAGNOSIS — I4891 Unspecified atrial fibrillation: Secondary | ICD-10-CM | POA: Diagnosis not present

## 2013-01-20 DIAGNOSIS — I499 Cardiac arrhythmia, unspecified: Secondary | ICD-10-CM | POA: Diagnosis not present

## 2013-01-20 DIAGNOSIS — I2510 Atherosclerotic heart disease of native coronary artery without angina pectoris: Secondary | ICD-10-CM | POA: Diagnosis not present

## 2013-01-23 ENCOUNTER — Telehealth: Payer: Self-pay | Admitting: Cardiology

## 2013-01-23 ENCOUNTER — Ambulatory Visit (HOSPITAL_COMMUNITY): Payer: Medicare Other | Attending: Cardiovascular Disease | Admitting: Radiology

## 2013-01-23 ENCOUNTER — Encounter: Payer: Self-pay | Admitting: Cardiovascular Disease

## 2013-01-23 ENCOUNTER — Ambulatory Visit (INDEPENDENT_AMBULATORY_CARE_PROVIDER_SITE_OTHER): Payer: Medicare Other | Admitting: Cardiovascular Disease

## 2013-01-23 VITALS — BP 150/85 | HR 60 | Ht 71.0 in | Wt 200.0 lb

## 2013-01-23 DIAGNOSIS — I080 Rheumatic disorders of both mitral and aortic valves: Secondary | ICD-10-CM | POA: Diagnosis not present

## 2013-01-23 DIAGNOSIS — I4891 Unspecified atrial fibrillation: Secondary | ICD-10-CM | POA: Diagnosis not present

## 2013-01-23 DIAGNOSIS — R0989 Other specified symptoms and signs involving the circulatory and respiratory systems: Secondary | ICD-10-CM | POA: Insufficient documentation

## 2013-01-23 DIAGNOSIS — I079 Rheumatic tricuspid valve disease, unspecified: Secondary | ICD-10-CM | POA: Diagnosis not present

## 2013-01-23 DIAGNOSIS — I4892 Unspecified atrial flutter: Secondary | ICD-10-CM | POA: Insufficient documentation

## 2013-01-23 DIAGNOSIS — R142 Eructation: Secondary | ICD-10-CM | POA: Diagnosis not present

## 2013-01-23 DIAGNOSIS — I2510 Atherosclerotic heart disease of native coronary artery without angina pectoris: Secondary | ICD-10-CM | POA: Insufficient documentation

## 2013-01-23 DIAGNOSIS — Z951 Presence of aortocoronary bypass graft: Secondary | ICD-10-CM | POA: Insufficient documentation

## 2013-01-23 DIAGNOSIS — R143 Flatulence: Secondary | ICD-10-CM

## 2013-01-23 DIAGNOSIS — R0602 Shortness of breath: Secondary | ICD-10-CM

## 2013-01-23 DIAGNOSIS — R140 Abdominal distension (gaseous): Secondary | ICD-10-CM

## 2013-01-23 DIAGNOSIS — I4949 Other premature depolarization: Secondary | ICD-10-CM | POA: Insufficient documentation

## 2013-01-23 DIAGNOSIS — R0609 Other forms of dyspnea: Secondary | ICD-10-CM | POA: Insufficient documentation

## 2013-01-23 NOTE — Progress Notes (Signed)
Maxwell Caul Date of Birth  [birth year]       [facility]    [place] [address]  [address] [place], [place]  [address]   [place], [place]  [address] [phone]     [phone]   Fax  [phone]    Fax [phone]  Problem List: 1. Atrial Fib   History of Present Illness:  Ed presents with abdominal swelling that occurred several days following an A-fib ablation.  He noticed abdominal swelling and distention almost immediately after the procedure. Worse for a couple days. He thinks it may be improving over the past day or so. He has had some bowel movements his appetite has been relatively poor. He denies any blood or diarrhea. He denies abdominal pain. He denies any shortness of breath or chest pain.  Current Outpatient Prescriptions on File Prior to Visit  Medication Sig Dispense Refill  . apixaban (ELIQUIS) 5 MG TABS tablet Take 1 tablet (5 mg total) by mouth 2 (two) times daily.  60 tablet  11  . atorvastatin (LIPITOR) 20 MG tablet Take 1 tablet (20 mg total) by mouth daily.  90 tablet  3  . Calcium & Magnesium Carbonates (MYLANTA PO) Take 10 mg by mouth daily as needed.       . dofetilide (TIKOSYN) 125 MCG capsule Take 1 capsule (125 mcg total) by mouth 2 (two) times daily.  180 capsule  3  . Multiple Vitamin (MULTIVITAMIN WITH MINERALS) TABS Take 1 tablet by mouth daily.      Marland Garlington NEXIUM 40 MG capsule TAKE 1 CAPSULE ONCE DAILY BEFORE BREAKFAST.  30 capsule  11  . potassium chloride (MICRO-K) 10 MEQ CR capsule Take 10 mEq by mouth daily.      . ramipril (ALTACE) 2.5 MG capsule Take 1 capsule (2.5 mg total) by mouth daily.  30 capsule  5  . valACYclovir (VALTREX) 1000 MG tablet Take 1,000 mg by mouth daily as needed. FOR FEVER BLISTER       No current facility-administered medications on file prior to visit.    Allergies  Allergen Reactions  . Sulfonamide Derivatives     "have no idea; mother told me I was allergic to"     Past Medical History  Diagnosis Date  . CAD (coronary artery disease)     Myoview March, 2011, excellent exercise, hypertensive response, no scar or ischemia, EF 61%, mild palpitations peak stress with infrequent PACs and PVCs.  Marland Poucher Hyperlipidemia     Low HDL  . GERD (gastroesophageal reflux disease)     Barrett's esophagus  . Colonic polyp   . Palpitations      exercise palpitations, probably A. fib-not VT,Pindolol not tolerated, CAD, bradycardia,Tickosyn needs hospital +/_ side effects, amiodarone & sotalol could need pacer,Multaq less effective not tried, atrial fib ablation could be considered at low risk  . Foot pain     Chronic lateral foot pain-resolved with use of orthotics '09  . Aortic stenosis     Mild, echo, April, 2012  . Carotid artery disease     Doppler November, 2010, 0-39% bilateral followup 2 years  . Warfarin anticoagulation     Coumadin for atrial fib  . Aortic insufficiency      Mild, echo, 2010 / mild/moderate, echo, 2012  . Elevated bilirubin     Mild chronic elevation, 2.0 January, 2011 stable  . BPH (benign prostatic hyperplasia)   . Drug intolerance  Pindolol before exercise not tolerated  . Ejection fraction     EF 60-65%, echo, September, 2010 / EF 60-65% echo, April, 2012  . Hypokalemia     Potassium 3.6 before potassium started April, 2012  . Hx of colonoscopy   . Chronic foot pain 2009    Resolved with use of orthotics  . Intolerance of drug     Fish oil, bilirubin, nacin  . BPH (benign prostatic hyperplasia)   . Pulmonic stenosis     Mild, echo, April, 2012  . Drug therapy     Tikosyn April, 2012  . TSH (thyroid-stimulating hormone deficiency)   . Hemorrhoids     September, 2012  . Numbness of toes     Slight numbness of one toe, September, 2012  . Lung granuloma     Left  lung chest x-ray July, 2013  . A-fib      Admission for Tikosyn load, March 20, 2011, converted after first dose, QTC 440 at the time of discharge. / Office  note March 27, 2011, clinically in and out of atrial, QTC 500 ms, Dr.Klein decreased Tikosyn to 375mg  BID.  Atrial fib at rest February 25, 2011 / plan to admit for [facility] therapy  . PVC's (premature ventricular contractions)   . Atrial flutter     Status post ablation  . Arrhythmia     h/o bradycardia  . HTN (hypertension)     Controlled at rest, hypertension on treadmill, sensitive to medications  . Heart murmur     "since I was a little boy"  . MVP (mitral valve prolapse)   . Anginal pain 2000    "before bypass"  . Shortness of breath 07/15/2012    "can happen at any time; related to AF and bradycardia"  . Barrett's esophagus   . Migraines 07/15/2012    "years ago; before glasses changed"  . Primary osteoarthritis of left knee     Mild    Past Surgical History  Procedure Laterality Date  . Insert / replace / remove pacemaker  07/15/2012    initial placement  . Tonsillectomy  1956  . Coronary artery bypass graft  2000    CABG X5  . Radiofrequency  ablation for heart arrythmia      History  Smoking status  . Never Smoker   Smokeless tobacco  . Never Used    History  Alcohol Use  . 12.6 oz/week  . 21 Glasses of wine per week    Family History  Problem Relation Age of Onset  . Hyperlipidemia    . Hypertension    . Coronary artery disease Other 72  . Diabetes Other   . Colon cancer Neg Hx   . Multiple myeloma Mother   . Diabetes Father   . Renal Disease Father     Reviw of Systems:  Reviewed in the HPI.  All other systems are negative.  Physical Exam: Blood pressure 150/85, pulse 60, height 5\' 11"  (1.803 m), weight 200 lb (90.719 kg). General: Well developed, well nourished, in no acute distress.  Head: Normocephalic, atraumatic, sclera non-icteric, mucus membranes are moist,   Neck: Supple. Carotids are 2 + without bruits. No JVD   Lungs: Clear   Heart: RR, normal S1, S2  Abdomen: Soft, non-tender, non-distended with normal bowel sounds.  Msk:   Strength and tone are normal   Extremities: No clubbing or cyanosis. No edema.  Distal pedal pulses are 2+ and equal    Neuro: CN II -  XII intact.  Alert and oriented X 3.   Psych:  Normal   ECG: 01/23/2013 atrial pacing at 60 beats a minute. He has T-wave inversions in leads V1 and V2. He has no ST or T wave changes.  Assessment / Plan:

## 2013-01-23 NOTE — Telephone Encounter (Signed)
Pt on Dr Harvie Bridge schedule today so was not called.

## 2013-01-23 NOTE — Telephone Encounter (Signed)
DOD CALL, C/O SOB POST ABLATION PER DR KATZ. APPOINTMENT TODAY, NEEDS ECHO PER DR. Elease Hashimoto. PT AWARE

## 2013-01-23 NOTE — Assessment & Plan Note (Addendum)
Russell Griffith presents today with some abdominal distention. It started immediately after his atrial fibrillation ablation.  He seems to have a postop ileus. We did a very quick limited echocardiogram. He has well-preserved left inject her systolic function-unchanged from his previous echo in August. There is no pericardial effusion. He is normal RV function.  I suspect that this is a postop ileus and will resolve in the next several days. He will call back on Wednesday if this has not improved. He was found to return to see Dr. Myrtis Ser in several weeks.

## 2013-01-23 NOTE — Telephone Encounter (Signed)
New problem     Would like to be seen today     C/O ablation on last Thursday in [place] . Talk with MD there was suggest to call the office this am since he was having problem .

## 2013-01-23 NOTE — Patient Instructions (Addendum)
KEEP FOLLOW UP WITH DR. KATZ 02-03-13  CALL us IF ANYTHING CHANGES [phone]  Your physician recommends that you continue on your current medications as directed. Please refer to the Current Medication list given to you today.

## 2013-01-23 NOTE — Progress Notes (Signed)
Limited Echocardiogram performed.  

## 2013-02-02 ENCOUNTER — Encounter: Payer: Self-pay | Admitting: Cardiology

## 2013-02-03 ENCOUNTER — Ambulatory Visit: Payer: 59 | Admitting: Cardiology

## 2013-02-03 ENCOUNTER — Ambulatory Visit: Payer: Self-pay | Admitting: Cardiology

## 2013-02-03 ENCOUNTER — Encounter: Payer: Self-pay | Admitting: Cardiology

## 2013-02-03 NOTE — Progress Notes (Signed)
   The patient was scheduled to see me in the office today. The office was closed for inclement weather. I called the patient to see how he is doing.  The patient had undergone atrial fib ablation by Dr. Delena Serve. The procedure was successful. After he came home we received a call from him last week that he was feeling abdominal bloating. He was seen in the office by Dr.Nahser who felt that his cardiac status was stable. He had a limited 2-D echo to be sure there was no significant pericardial effusion. His ejection fraction was still 55% and there was no significant pericardial effusion. It was felt that his symptoms were probably related to mild continued decreased function of his GI tract after anesthesia. He was to be followed clinically. In speaking with the patient today this abdominal problem is resolved.  The patient does have an ongoing issue with some shortness of breath. He does not have PND or orthopnea. The shortness of breath appears to be exertional. It is not severe. He feels it when he walks up stairs. He also feels that when he has tried to do low-level exercise on the elliptical exercise machine. As I questioned him about his heart rate response on the exercise equipment he says that he did note that his heart rate appeared to go only to 80 beats per minute. Normally he would have a higher response from the level of exercise that he was doing.  It seems that there may be a need for further adjustment of his pacemaker. The patient tells me that he was informed by Dr. Delena Serve that a pacemaker parameter had been reprogrammed. At this point I wonder if his symptoms are related to his pacer function. I told the patient that I would be in touch with Dr. Graciela Husbands and arrange for the patient to be seen in the office next week for further assessment of his pacemaker.  The patient also informed me that he had been sent home on Tikosyn to be continued until he returns from her trip to [place]. He was  continued on Eliquis. He was also treated with aspirin. He has had some increased skin bleeding since being at home, and he has held the aspirin. I told him that at this point I was okay with the plan to continue Eliquis without the aspirin.  Jerral Bonito, MD

## 2013-02-09 ENCOUNTER — Encounter: Payer: Self-pay | Admitting: Internal Medicine

## 2013-02-09 ENCOUNTER — Ambulatory Visit (INDEPENDENT_AMBULATORY_CARE_PROVIDER_SITE_OTHER): Payer: Medicare Other | Admitting: Internal Medicine

## 2013-02-09 VITALS — BP 126/62 | HR 60 | Wt 196.2 lb

## 2013-02-09 DIAGNOSIS — I4891 Unspecified atrial fibrillation: Secondary | ICD-10-CM

## 2013-02-09 DIAGNOSIS — I495 Sick sinus syndrome: Secondary | ICD-10-CM

## 2013-02-09 DIAGNOSIS — Z950 Presence of cardiac pacemaker: Secondary | ICD-10-CM | POA: Diagnosis not present

## 2013-02-09 LAB — PACEMAKER DEVICE OBSERVATION
AL IMPEDENCE PM: 638 Ohm
RV LEAD AMPLITUDE: 25 mv
VENTRICULAR PACING PM: 31

## 2013-02-09 NOTE — Assessment & Plan Note (Signed)
Pt developed QT prolongation on tiksoyn previously; the was resumed by [facility]  We need to be exceedingly careful with QT monitoring and attention to K/Mg

## 2013-02-09 NOTE — Assessment & Plan Note (Signed)
The patient's device was interrogated and the information was fully reviewed.  The device was reprogrammed as above. 

## 2013-02-09 NOTE — Assessment & Plan Note (Signed)
i suspect that the loss of exercise capacity is likely related in part to the restoration of sinus rhythm and the loss of heart rate excursion that was present in atrial fibrillation.  We have reprogrammed the pacer, but i suspect we will need to activate the rate response

## 2013-02-09 NOTE — Progress Notes (Signed)
Patient Care Team: Jacques Navy, MD as PCP - General Luis Abed, MD (Cardiology) Enid Baas, MD (Family Medicine) Duke Salvia, MD (Cardiology)   HPI  Russell Griffith is a 67 y.o. male Seen following PV isolation done at [facility] since which time he has noted increased shortness of breath.  An echo showed no effusion, and there was a question as to whether reprogramming of his pacemaker might be contributing.  Specifically his sob is notable with modest exertion; he notes on the treadmill/ellipitical that his HR is not exceeding 95 as opposed to 120s previously   Interrogation of his pacer demonstartes a change in max tracking rate and also the AV delay.  Unfotunatedly, histogram data was not reset at the time of the procedure  Past Medical History  Diagnosis Date  . CAD (coronary artery disease)     Myoview March, 2011, excellent exercise, hypertensive response, no scar or ischemia, EF 61%, mild palpitations peak stress with infrequent PACs and PVCs.  Marland Peeler Hyperlipidemia     Low HDL  . GERD (gastroesophageal reflux disease)     Barrett's esophagus  . Colonic polyp   . Palpitations      exercise palpitations, probably A. fib-not VT,Pindolol not tolerated, CAD, bradycardia,Tickosyn needs hospital +/_ side effects, amiodarone & sotalol could need pacer,Multaq less effective not tried, atrial fib ablation could be considered at low risk  . Foot pain     Chronic lateral foot pain-resolved with use of orthotics '09  . Aortic stenosis     Mild, echo, April, 2012  . Carotid artery disease     Doppler November, 2010, 0-39% bilateral followup 2 years  . Warfarin anticoagulation     Coumadin for atrial fib  . Aortic insufficiency      Mild, echo, 2010 / mild/moderate, echo, 2012  . Elevated bilirubin     Mild chronic elevation, 2.0 January, 2011 stable  . BPH (benign prostatic hyperplasia)   . Drug intolerance     Pindolol before exercise not tolerated  . Ejection fraction     EF  60-65%, echo, September, 2010 / EF 60-65% echo, April, 2012  . Hypokalemia     Potassium 3.6 before potassium started April, 2012  . Hx of colonoscopy   . Chronic foot pain 2009    Resolved with use of orthotics  . Intolerance of drug     Fish oil, bilirubin, nacin  . BPH (benign prostatic hyperplasia)   . Pulmonic stenosis     Mild, echo, April, 2012  . Drug therapy     Tikosyn April, 2012  . TSH (thyroid-stimulating hormone deficiency)   . Hemorrhoids     September, 2012  . Numbness of toes     Slight numbness of one toe, September, 2012  . Lung granuloma     Left  lung chest x-ray July, 2013  . A-fib      Admission for Tikosyn load, March 20, 2011, converted after first dose, QTC 440 at the time of discharge. / Office note March 27, 2011, clinically in and out of atrial, QTC 500 ms, Dr.Klein decreased Tikosyn to 375mg  BID.  Atrial fib at rest February 25, 2011 / plan to admit for [facility] therapy  . PVC's (premature ventricular contractions)   . Atrial flutter     Status post ablation  . Arrhythmia     h/o bradycardia  . HTN (hypertension)     Controlled at rest, hypertension on treadmill, sensitive to medications  .  Heart murmur     "since I was a little boy"  . MVP (mitral valve prolapse)   . Anginal pain 2000    "before bypass"  . Shortness of breath 07/15/2012    "can happen at any time; related to AF and bradycardia"  . Barrett's esophagus   . Migraines 07/15/2012    "years ago; before glasses changed"  . Primary osteoarthritis of left knee     Mild    Past Surgical History  Procedure Laterality Date  . Insert / replace / remove pacemaker  07/15/2012    initial placement  . Tonsillectomy  1956  . Coronary artery bypass graft  2000    CABG X5  . Radiofrequency  ablation for heart arrythmia      Current Outpatient Prescriptions  Medication Sig Dispense Refill  . apixaban (ELIQUIS) 5 MG TABS tablet Take 1 tablet (5 mg total) by mouth 2 (two) times daily.  60  tablet  11  . atorvastatin (LIPITOR) 20 MG tablet Take 1 tablet (20 mg total) by mouth daily.  90 tablet  3  . Calcium & Magnesium Carbonates (MYLANTA PO) Take 10 mg by mouth daily as needed.       . dofetilide (TIKOSYN) 125 MCG capsule Take three capsules by mouth twice daily      . Multiple Vitamin (MULTIVITAMIN WITH MINERALS) TABS Take 1 tablet by mouth daily.      Marland Karam NEXIUM 40 MG capsule TAKE 1 CAPSULE ONCE DAILY BEFORE BREAKFAST.  30 capsule  11  . potassium chloride (MICRO-K) 10 MEQ CR capsule Take 10 mEq by mouth daily.      . ramipril (ALTACE) 2.5 MG capsule Take 1 capsule (2.5 mg total) by mouth daily.  30 capsule  5  . valACYclovir (VALTREX) 1000 MG tablet Take 1,000 mg by mouth daily as needed. FOR FEVER BLISTER       No current facility-administered medications for this visit.    Allergies  Allergen Reactions  . Sulfonamide Derivatives     "have no idea; mother told me I was allergic to"    Review of Systems negative except from HPI and PMH  Physical Exam BP 126/62  Pulse 60  Wt 196 lb 4 oz (89.018 kg)  BMI 27.38 kg/m2 Well developed and well nourished in no acute distress HENT normal E scleral and icterus clear Neck Supple JVP flat; carotids brisk and full Clear to ausculation   Regular rate and rhythm, no murmurs gallops or rub Soft with active bowel sounds No clubbing cyanosis no Edema Alert and oriented, grossly normal motor and sensory function Skin Warm and Dry    Assessment and  Plan

## 2013-02-13 NOTE — Addendum Note (Signed)
Addended by: Freddi Starr on: 02/13/2013 08:30 AM   Modules accepted: Orders

## 2013-02-16 ENCOUNTER — Telehealth: Payer: Self-pay | Admitting: *Deleted

## 2013-02-16 NOTE — Telephone Encounter (Signed)
Letter received from the patient's insurance plan requesting prior authorization for Eliquis. Letter was brought to me by Burgess Amor, Pharm D stating this was on Sally's desk- dated 2/3. Per my call to the pharmacy, PA is already on file for the patient. PA # is [id number]. The patient had this filled on 3/9. Will document as FYI. Patien's member ID # is [id number].

## 2013-02-28 ENCOUNTER — Ambulatory Visit (HOSPITAL_COMMUNITY): Payer: Medicare Other | Attending: Cardiology

## 2013-02-28 ENCOUNTER — Encounter: Payer: Self-pay | Admitting: Internal Medicine

## 2013-02-28 ENCOUNTER — Other Ambulatory Visit: Payer: Self-pay

## 2013-02-28 ENCOUNTER — Ambulatory Visit (INDEPENDENT_AMBULATORY_CARE_PROVIDER_SITE_OTHER): Payer: Medicare Other | Admitting: Internal Medicine

## 2013-02-28 DIAGNOSIS — R0602 Shortness of breath: Secondary | ICD-10-CM | POA: Diagnosis not present

## 2013-02-28 DIAGNOSIS — R0989 Other specified symptoms and signs involving the circulatory and respiratory systems: Secondary | ICD-10-CM

## 2013-02-28 LAB — PACEMAKER DEVICE OBSERVATION

## 2013-02-28 NOTE — Progress Notes (Signed)
Exercise Treadmill Test  Pre-Exercise Testing Evaluation Rhythm: normal sinus  Rate: 61   PR:  .18 QRS:  .09  QT:  .45 QTc: .45           Test  Exercise Tolerance Test Ordering MD: Sherryl Manges, MD  Interpreting MD: Sherryl Manges, MD  Unique Test No: 1  Treadmill:  2  Indication for ETT: exertional dyspnea  Contraindication to ETT: No   Stress Modality: exercise - treadmill  Cardiac Imaging Performed: non   Protocol: standard Bruce - maximal  Max BP:  220/87  Max MPHR (bpm):  155 85% MPR (bpm):  130  MPHR obtained (bpm):  130 % MPHR obtained:  83  Reached 85% MPHR (min:sec):  6:25 Total Exercise Time (min-sec):  6:25  Workload in METS:  13.10 Borg Scale: 15  Reason ETT Terminated:  per Dr. Graciela Husbands      Comments: HR >130 with exercise   Recommendations: Rate response activated

## 2013-03-17 ENCOUNTER — Encounter: Payer: Self-pay | Admitting: Cardiology

## 2013-03-20 ENCOUNTER — Encounter: Payer: Self-pay | Admitting: Internal Medicine

## 2013-03-29 ENCOUNTER — Encounter: Payer: Self-pay | Admitting: Internal Medicine

## 2013-04-19 ENCOUNTER — Other Ambulatory Visit: Payer: Self-pay

## 2013-04-19 MED ORDER — RAMIPRIL 2.5 MG PO CAPS: 2.5000 mg | ORAL_CAPSULE | Freq: Every day | ORAL | Status: DC

## 2013-04-19 NOTE — Telephone Encounter (Signed)
ramipril (ALTACE) 2.5 MG capsule  Take 1 capsule (2.5 mg total) by mouth daily.   30 capsule   5    Patient Instructions    Your physician recommends that you schedule a follow-up appointment in: early March           Patient Instructions History Recorded

## 2013-05-02 ENCOUNTER — Telehealth: Payer: Self-pay | Admitting: Internal Medicine

## 2013-05-02 NOTE — Telephone Encounter (Signed)
Spoke with pt, he is post ablation with dr Delena Serve and instead of going back to dr Delena Serve they wanted him to follow up with dr Graciela Husbands with an EKG. Follow up appt scheduled.

## 2013-05-02 NOTE — Telephone Encounter (Signed)
New Problem  Pt wants to talk to you regarding his ablation.

## 2013-05-12 ENCOUNTER — Ambulatory Visit (INDEPENDENT_AMBULATORY_CARE_PROVIDER_SITE_OTHER): Payer: Medicare Other | Admitting: Internal Medicine

## 2013-05-12 ENCOUNTER — Encounter: Payer: Self-pay | Admitting: Internal Medicine

## 2013-05-12 VITALS — BP 150/72 | HR 55 | Ht 71.0 in | Wt 195.4 lb

## 2013-05-12 DIAGNOSIS — I495 Sick sinus syndrome: Secondary | ICD-10-CM

## 2013-05-12 DIAGNOSIS — I4891 Unspecified atrial fibrillation: Secondary | ICD-10-CM

## 2013-05-12 LAB — PACEMAKER DEVICE OBSERVATION
AL AMPLITUDE: 5.8 mv
ATRIAL PACING PM: 48
DEVICE MODEL PM: 111255
VENTRICULAR PACING PM: 2

## 2013-05-12 NOTE — Assessment & Plan Note (Signed)
The patient has had short episodes of nonsustained atrial fibrillation with exercise. T  Electrograms are identified. They will be faxed tr Dr. Delena Serve at a [facility].

## 2013-05-12 NOTE — Assessment & Plan Note (Signed)
Stable post pacing 

## 2013-05-12 NOTE — Progress Notes (Signed)
Patient Care Team: Jacques Navy, MD as PCP - General Luis Abed, MD (Cardiology) Enid Baas, MD (Family Medicine) Duke Salvia, MD (Cardiology)   HPI  Russell Griffith is a 67 y.o. male Seen in followup for atrial fibrillation for which she underwent pulmonary vein isolation at [facility]. He is also status post pacemaker implantation for significant pausing in the context of intermittent atrial fibrillation and also significant sinus node dysfunction. this was addressed by activation rate response of his pacemaker April 2014  Is a history of coronary artery disease with bypass surgery 2000 and a Myoview November 2013 demonstrating no ischemia or scar. LV function was normal by echo February 2014  He has done well. He had no significant arrhythmia at all. He has noted some palpitations at peak exercise.  Past Medical History  Diagnosis Date  . CAD (coronary artery disease)     Myoview March, 2011, excellent exercise, hypertensive response, no scar or ischemia, EF 61%, mild palpitations peak stress with infrequent PACs and PVCs.  Marland Sutherlin Hyperlipidemia     Low HDL  . GERD (gastroesophageal reflux disease)     Barrett's esophagus  . Colonic polyp   . Palpitations      exercise palpitations, probably A. fib-not VT,Pindolol not tolerated, CAD, bradycardia,Tickosyn needs hospital +/_ side effects, amiodarone & sotalol could need pacer,Multaq less effective not tried, atrial fib ablation could be considered at low risk  . Foot pain     Chronic lateral foot pain-resolved with use of orthotics '09  . Aortic stenosis     Mild, echo, April, 2012  . Carotid artery disease     Doppler November, 2010, 0-39% bilateral followup 2 years  . Warfarin anticoagulation     Coumadin for atrial fib  . Aortic insufficiency      Mild, echo, 2010 / mild/moderate, echo, 2012  . Elevated bilirubin     Mild chronic elevation, 2.0 January, 2011 stable  . BPH (benign prostatic hyperplasia)   . Drug intolerance      Pindolol before exercise not tolerated  . Ejection fraction     EF 60-65%, echo, September, 2010 / EF 60-65% echo, April, 2012  . Hypokalemia     Potassium 3.6 before potassium started April, 2012  . Hx of colonoscopy   . Chronic foot pain 2009    Resolved with use of orthotics  . Intolerance of drug     Fish oil, bilirubin, nacin  . BPH (benign prostatic hyperplasia)   . Pulmonic stenosis     Mild, echo, April, 2012  . Drug therapy     Tikosyn April, 2012  . TSH (thyroid-stimulating hormone deficiency)   . Hemorrhoids     September, 2012  . Numbness of toes     Slight numbness of one toe, September, 2012  . Lung granuloma     Left  lung chest x-ray July, 2013  . A-fib      Admission for Tikosyn load, March 20, 2011, converted after first dose, QTC 440 at the time of discharge. / Office note March 27, 2011, clinically in and out of atrial, QTC 500 ms, Dr.Klein decreased Tikosyn to 375mg  BID.  Atrial fib at rest February 25, 2011 / plan to admit for [facility] therapy  . PVC's (premature ventricular contractions)   . Atrial flutter     Status post ablation  . Arrhythmia     h/o bradycardia  . HTN (hypertension)     Controlled at rest, hypertension on  treadmill, sensitive to medications  . Heart murmur     "since I was a little boy"  . MVP (mitral valve prolapse)   . Anginal pain 2000    "before bypass"  . Shortness of breath 07/15/2012    "can happen at any time; related to AF and bradycardia"  . Barrett's esophagus   . Migraines 07/15/2012    "years ago; before glasses changed"  . Primary osteoarthritis of left knee     Mild    Past Surgical History  Procedure Laterality Date  . Insert / replace / remove pacemaker  07/15/2012    initial placement  . Tonsillectomy  1956  . Coronary artery bypass graft  2000    CABG X5  . Radiofrequency  ablation for heart arrythmia      Current Outpatient Prescriptions  Medication Sig Dispense Refill  . apixaban (ELIQUIS) 5 MG  TABS tablet Take 1 tablet (5 mg total) by mouth 2 (two) times daily.  60 tablet  11  . atorvastatin (LIPITOR) 20 MG tablet Take 1 tablet (20 mg total) by mouth daily.  90 tablet  3  . Calcium & Magnesium Carbonates (MYLANTA PO) Take 10 mg by mouth daily as needed.       . dofetilide (TIKOSYN) 125 MCG capsule Take three capsules by mouth twice daily      . Multiple Vitamin (MULTIVITAMIN WITH MINERALS) TABS Take 1 tablet by mouth daily.      Marland Lichtenberg NEXIUM 40 MG capsule TAKE 1 CAPSULE ONCE DAILY BEFORE BREAKFAST.  30 capsule  11  . potassium chloride (MICRO-K) 10 MEQ CR capsule Take 10 mEq by mouth daily.      . ramipril (ALTACE) 2.5 MG capsule Take 1 capsule (2.5 mg total) by mouth daily.  30 capsule  3  . valACYclovir (VALTREX) 1000 MG tablet Take 1,000 mg by mouth daily as needed. FOR FEVER BLISTER       No current facility-administered medications for this visit.    Allergies  Allergen Reactions  . Sulfonamide Derivatives     "have no idea; mother told me I was allergic to"    Review of Systems negative except from HPI and PMH  Physical Exam BP 150/72  Pulse 55  Ht 5\' 11"  (1.803 m)  Wt 195 lb 6.4 oz (88.633 kg)  BMI 27.26 kg/m2 Well developed and nourished in no acute distress HENT normal Neck supple with JVP-flat Clear Regular rate and rhythm, no murmurs or gallops Abd-soft with active BS No Clubbing cyanosis edema Skin-warm and dry A & Oriented  Grossly normal sensory and motor function  ECG demonstrates atrial pacing at 55 Intervals 20/09/43  Assessment and  Plan

## 2013-05-12 NOTE — Patient Instructions (Signed)
Your physician recommends that you schedule a follow-up appointment in: 2 -3  MONTHS WITH DR Myrtis Ser Your physician wants you to follow-up in:  YEAR WITH DR Logan Bores will receive a reminder letter in the mail two months in advance. If you don't receive a letter, please call our office to schedule the follow-up appointment. Your physician recommends that you continue on your current medications as directed. Please refer to the Current Medication list given to you today.

## 2013-06-09 ENCOUNTER — Telehealth: Payer: Self-pay | Admitting: Internal Medicine

## 2013-06-09 NOTE — Telephone Encounter (Signed)
Pt Dropped Off ROI x2 Gve pt Copy of Pacer check/EKG, and also faxed  Pacer/EKG to Dr.Wharton/[place] at [phone]  06/09/13/KM

## 2013-06-15 ENCOUNTER — Telehealth: Payer: Self-pay | Admitting: Internal Medicine

## 2013-06-15 NOTE — Telephone Encounter (Signed)
Pacer Check,Ekg Refaxed to Dr.Wharton/[facility] @ [phone] 06/15/13/KM

## 2013-06-16 ENCOUNTER — Ambulatory Visit (INDEPENDENT_AMBULATORY_CARE_PROVIDER_SITE_OTHER): Payer: Medicare Other | Admitting: *Deleted

## 2013-06-16 DIAGNOSIS — I4891 Unspecified atrial fibrillation: Secondary | ICD-10-CM

## 2013-06-16 NOTE — Progress Notes (Signed)
Pacemaker interrogation only for AF episodes.

## 2013-06-26 ENCOUNTER — Other Ambulatory Visit (INDEPENDENT_AMBULATORY_CARE_PROVIDER_SITE_OTHER): Payer: Medicare Other

## 2013-06-26 ENCOUNTER — Ambulatory Visit (INDEPENDENT_AMBULATORY_CARE_PROVIDER_SITE_OTHER): Payer: Medicare Other | Admitting: Internal Medicine

## 2013-06-26 VITALS — BP 139/74 | Temp 98.5°F | Wt 198.2 lb

## 2013-06-26 DIAGNOSIS — K2270 Barrett's esophagus without dysplasia: Secondary | ICD-10-CM

## 2013-06-26 DIAGNOSIS — N401 Enlarged prostate with lower urinary tract symptoms: Secondary | ICD-10-CM

## 2013-06-26 DIAGNOSIS — I10 Essential (primary) hypertension: Secondary | ICD-10-CM

## 2013-06-26 DIAGNOSIS — F341 Dysthymic disorder: Secondary | ICD-10-CM

## 2013-06-26 DIAGNOSIS — E079 Disorder of thyroid, unspecified: Secondary | ICD-10-CM | POA: Diagnosis not present

## 2013-06-26 DIAGNOSIS — E785 Hyperlipidemia, unspecified: Secondary | ICD-10-CM

## 2013-06-26 DIAGNOSIS — I779 Disorder of arteries and arterioles, unspecified: Secondary | ICD-10-CM

## 2013-06-26 DIAGNOSIS — I4891 Unspecified atrial fibrillation: Secondary | ICD-10-CM

## 2013-06-26 DIAGNOSIS — Q663 Other congenital varus deformities of feet, unspecified foot: Secondary | ICD-10-CM

## 2013-06-26 DIAGNOSIS — Z0000 Encounter for general adult medical examination without abnormal findings: Secondary | ICD-10-CM

## 2013-06-26 LAB — COMPREHENSIVE METABOLIC PANEL
AST: 28 U/L (ref 0–37)
Albumin: 4.2 g/dL (ref 3.5–5.2)
Alkaline Phosphatase: 73 U/L (ref 39–117)
BUN: 19 mg/dL (ref 6–23)
Calcium: 9.4 mg/dL (ref 8.4–10.5)
Chloride: 104 mEq/L (ref 96–112)
Glucose, Bld: 93 mg/dL (ref 70–99)
Potassium: 4.2 mEq/L (ref 3.5–5.1)
Sodium: 138 mEq/L (ref 135–145)
Total Protein: 7.1 g/dL (ref 6.0–8.3)

## 2013-06-26 LAB — TSH: TSH: 4.06 u[IU]/mL (ref 0.35–5.50)

## 2013-06-26 LAB — LIPID PANEL
Cholesterol: 135 mg/dL (ref 0–200)
LDL Cholesterol: 88 mg/dL (ref 0–99)
VLDL: 10.6 mg/dL (ref 0.0–40.0)

## 2013-06-26 LAB — HEPATIC FUNCTION PANEL
ALT: 34 U/L (ref 0–53)
Bilirubin, Direct: 0.4 mg/dL — ABNORMAL HIGH (ref 0.0–0.3)
Total Bilirubin: 2.4 mg/dL — ABNORMAL HIGH (ref 0.3–1.2)

## 2013-06-26 NOTE — Progress Notes (Signed)
Subjective:    Patient ID: Russell Griffith, male    DOB: 1946-03-02, 67 y.o.   MRN: [id number]  HPI Russell Griffith is here for annual Medicare wellness examination and management of other chronic and acute problems.  Interval history - s/p cardiac ablation procedure for a. Fib by Dr. Delena Serve in [facility] with good results; PTVDP - DDD.    The risk factors are reflected in the social history.  The roster of all physicians providing medical care to patient - is listed in the Snapshot section of the chart.  Activities of daily living:  The patient is 100% inedpendent in all ADLs: dressing, toileting, feeding as well as independent mobility  Home safety : The patient has smoke detectors in the home. Falls - no falls. Home is fall safe.  They wear seatbelts. No firearms at home. There is no violence in the home.   There is no risks for hepatitis, STDs or HIV. There is no history of blood transfusion. They have no travel history to infectious disease endemic areas of the world.  The patient has seen their dentist in the last six month. They have seen their eye doctor in the last year. They deny any hearing difficulty and have not had audiologic testing in the last year.    They do not  have excessive sun exposure. Discussed the need for sun protection: hats, long sleeves and use of sunscreen if there is significant sun exposure.   Diet: the importance of a healthy diet is discussed. They do have a healthy diet.  The patient has a regular exercise program: aerobic and toning weight training , 50 min duration, 3-4 per week.  The benefits of regular aerobic exercise were discussed.  Depression screen: there are no signs or vegative symptoms of depression- irritability, change in appetite, anhedonia, sadness/tearfullness.  Cognitive assessment: the patient manages all their financial and personal affairs and is actively engaged.   The following portions of the patient's history were reviewed and updated as  appropriate: allergies, current medications, past family history, past medical history,  past surgical history, past social history  and problem list.  Vision, hearing, body mass index were assessed and reviewed.   During the course of the visit the patient was educated and counseled about appropriate screening and preventive services including : fall prevention , diabetes screening, nutrition counseling, colorectal cancer screening, and recommended immunizations.  Past Medical History  Diagnosis Date  . CAD (coronary artery disease)     Myoview March, 2011, excellent exercise, hypertensive response, no scar or ischemia, EF 61%, mild palpitations peak stress with infrequent PACs and PVCs.  Marland Berberich Hyperlipidemia     Low HDL  . GERD (gastroesophageal reflux disease)     Barrett's esophagus  . Colonic polyp   . Palpitations      exercise palpitations, probably A. fib-not VT,Pindolol not tolerated, CAD, bradycardia,Tickosyn needs hospital +/_ side effects, amiodarone & sotalol could need pacer,Multaq less effective not tried, atrial fib ablation could be considered at low risk  . Foot pain     Chronic lateral foot pain-resolved with use of orthotics '09  . Aortic stenosis     Mild, echo, April, 2012  . Carotid artery disease     Doppler November, 2010, 0-39% bilateral followup 2 years  . Warfarin anticoagulation     Coumadin for atrial fib  . Aortic insufficiency      Mild, echo, 2010 / mild/moderate, echo, 2012  . Elevated bilirubin  Mild chronic elevation, 2.0 January, 2011 stable  . BPH (benign prostatic hyperplasia)   . Drug intolerance     Pindolol before exercise not tolerated  . Ejection fraction     EF 60-65%, echo, September, 2010 / EF 60-65% echo, April, 2012  . Hypokalemia     Potassium 3.6 before potassium started April, 2012  . Hx of colonoscopy   . Chronic foot pain 2009    Resolved with use of orthotics  . Intolerance of drug     Fish oil, bilirubin, nacin  . BPH  (benign prostatic hyperplasia)   . Pulmonic stenosis     Mild, echo, April, 2012  . Drug therapy     Tikosyn April, 2012  . TSH (thyroid-stimulating hormone deficiency)   . Hemorrhoids     September, 2012  . Numbness of toes     Slight numbness of one toe, September, 2012  . Lung granuloma     Left  lung chest x-ray July, 2013  . A-fib      Admission for Tikosyn load, March 20, 2011, converted after first dose, QTC 440 at the time of discharge. / Office note March 27, 2011, clinically in and out of atrial, QTC 500 ms, Dr.Klein decreased Tikosyn to 375mg  BID.  Atrial fib at rest February 25, 2011 / plan to admit for [facility] therapy  . PVC's (premature ventricular contractions)   . Atrial flutter     Status post ablation  . Arrhythmia     h/o bradycardia  . HTN (hypertension)     Controlled at rest, hypertension on treadmill, sensitive to medications  . Heart murmur     "since I was a little boy"  . MVP (mitral valve prolapse)   . Anginal pain 2000    "before bypass"  . Shortness of breath 07/15/2012    "can happen at any time; related to AF and bradycardia"  . Barrett's esophagus   . Migraines 07/15/2012    "years ago; before glasses changed"  . Primary osteoarthritis of left knee     Mild   Past Surgical History  Procedure Laterality Date  . Insert / replace / remove pacemaker  07/15/2012    initial placement  . Tonsillectomy  1956  . Coronary artery bypass graft  2000    CABG X5  . Radiofrequency  ablation for heart arrythmia     Family History  Problem Relation Age of Onset  . Hyperlipidemia    . Hypertension    . Coronary artery disease Other 72  . Diabetes Other   . Colon cancer Neg Hx   . Multiple myeloma Mother   . Diabetes Father   . Renal Disease Father    History   Social History  . Marital Status: Married    Spouse Name: N/A    Number of Children: 2  . Years of Education: 18   Occupational History  . [employer]    Social History Main Topics  . Smoking status: Never Smoker   . Smokeless tobacco: Never Used  . Alcohol Use: 12.6 oz/week    21 Glasses of wine per week  . Drug Use: No  . Sexually Active: Yes -- Male partner(s)   Other Topics Concern  . Not on file   Social History Narrative   [employer] [place] [place].  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  2 daughters-'75, '79; 1 grandchild; step-daughter and step grandson.  Regular exercise-yes, runs 1.5-2 mi 4x/wk, also eliptical      Patient signed a Designated Party Release to allow his wife, Detrick Dani, to have access to his medical records/ information.    Current Outpatient Prescriptions on File Prior to Visit  Medication Sig Dispense Refill  . apixaban (ELIQUIS) 5 MG TABS tablet Take 1 tablet (5 mg total) by mouth 2 (two) times daily.  60 tablet  11  . atorvastatin (LIPITOR) 20 MG tablet Take 1 tablet (20 mg total) by mouth daily.  90 tablet  3  . Calcium & Magnesium Carbonates (MYLANTA PO) Take 10 mg by mouth daily as needed.       . Multiple Vitamin (MULTIVITAMIN WITH MINERALS) TABS Take 1 tablet by mouth daily.      Marland Lamos NEXIUM 40 MG capsule TAKE 1 CAPSULE ONCE DAILY BEFORE BREAKFAST.  30 capsule  11  . ramipril (ALTACE) 2.5 MG capsule Take 1 capsule (2.5 mg total) by mouth daily.  30 capsule  3   No current facility-administered medications on file prior to visit.      Review of Systems Constitutional:  Negative for fever, chills, activity change and unexpected weight change.  HEENT:  Negative for hearing loss, ear pain, congestion, neck stiffness and postnasal drip. Negative for sore throat or swallowing problems. Negative for dental complaints.   Eyes: Negative for vision loss or change in visual acuity.  Respiratory: Negative for chest tightness and wheezing. Negative for DOE, in fact better since the ablation.   Cardiovascular: Negative for chest pain or palpitations. No  decreased exercise tolerance Gastrointestinal: No change in bowel habit. No bloating or gas. No reflux or indigestion Genitourinary: Negative for urgency, frequency, flank pain and difficulty urinating.  Musculoskeletal: Negative for myalgias, back pain, arthralgias and gait problem. Knees are sensitive to torque. Neurological: Negative for dizziness, tremors, weakness and headaches.  Hematological: Negative for adenopathy.  Psychiatric/Behavioral: Negative for behavioral problems and dysphoric mood.       Objective:   Physical Exam Filed Vitals:   06/26/13 1522  BP: 139/74  Temp: 98.5 F (36.9 C)   Wt Readings from Last 3 Encounters:  06/26/13 198 lb 3.2 oz (89.903 kg)  05/12/13 195 lb 6.4 oz (88.633 kg)  02/09/13 196 lb 4 oz (89.018 kg)   Gen'l: Well nourished well developed white male in no acute distress  HEENT: Head: Normocephalic and atraumatic. Right Ear: External ear normal. EAC/TM nl. Left Ear: External ear normal.  EAC/TM nl. Nose: Nose normal. Mouth/Throat: Oropharynx is clear and moist. Dentition - native, in good repair. No buccal or palatal lesions. Posterior pharynx clear. Eyes: Conjunctivae and sclera clear. EOM intact. Pupils are equal, round, and reactive to light. Right eye exhibits no discharge. Left eye exhibits no discharge. Neck: Normal range of motion. Neck supple. No JVD present. No tracheal deviation present. No thyromegaly present.  Cardiovascular: Normal rate, regular rhythm, no gallop, no friction rub, no murmur heard.      Quiet precordium. 2+ radial and DP pulses . No carotid bruits Pulmonary/Chest: Effort normal. No respiratory distress or increased WOB, no wheezes, no rales. No chest wall deformity or CVAT. Pacemaker left upper chest. Abdomen: Soft. Bowel sounds are normal in all quadrants. He exhibits no distension, no tenderness, no rebound or guarding, No heptosplenomegaly  Genitourinary:  deferred Musculoskeletal: Normal range of motion. He exhibits  no edema and no tenderness.       Small and large joints without redness, synovial thickening or deformity. Full  range of motion preserved about all small, median and large joints.  Lymphadenopathy:    He has no cervical or supraclavicular adenopathy.  Neurological: He is alert and oriented to person, place, and time. CN II-XII intact. DTRs 2+ and symmetrical biceps, radial and patellar tendons. Cerebellar function normal with no tremor, rigidity, normal gait and station.  Skin: Skin is warm and dry. No rash noted. No erythema.  Psychiatric: He has a normal mood and affect. His behavior is normal. Thought content normal.   Recent Results (from the past 2160 hour(s))  PACEMAKER DEVICE OBSERVATION     Status: None   Collection Time    05/12/13 11:21 AM      Result Value Range   DEVICE MODEL PM [id number]     [id number] Duke Salvia  M.D.     [id number] Duke Salvia  M.D.     PACEART TECH NOTES PM       Value: Pacemaker check in clinic. Normal device function. Thresholds, sensing, impedances consistent with previous measurements. Device programmed to maximize longevity. 9 mode switches since last checked 02/28/13, <1%.  2 high ventricular rates noted 4 beats.      Device programmed at appropriate safety margins. Histogram distribution appropriate for patient activity level. Device programmed to optimize intrinsic conduction. Estimated longevity 13 years.  Patient education completed.  ROV 1 year with Dr. Graciela Husbands.   ATRIAL PACING PM 48     VENTRICULAR PACING PM 2     AL IMPEDENCE PM 634     RV LEAD IMPEDENCE PM 530     AL AMPLITUDE 5.8     RV LEAD AMPLITUDE 12    HEPATIC FUNCTION PANEL     Status: Abnormal   Collection Time    06/26/13  3:30 PM      Result Value Range   Total Bilirubin 2.4 (*) 0.3 - 1.2 mg/dL   Bilirubin, Direct 0.4 (*) 0.0 - 0.3 mg/dL   Alkaline Phosphatase 73  39 - 117 U/L   AST 28  0 - 37 U/L   ALT 34  0 - 53 U/L   Total Protein 7.1  6.0 - 8.3 g/dL   Albumin 4.2   3.5 - 5.2 g/dL  COMPREHENSIVE METABOLIC PANEL     Status: Abnormal   Collection Time    06/26/13  3:30 PM      Result Value Range   Sodium 138  135 - 145 mEq/L   Potassium 4.2  3.5 - 5.1 mEq/L   Chloride 104  96 - 112 mEq/L   CO2 30  19 - 32 mEq/L   Glucose, Bld 93  70 - 99 mg/dL   BUN 19  6 - 23 mg/dL   Creatinine, Ser 1.1  0.4 - 1.5 mg/dL   Total Bilirubin 2.4 (*) 0.3 - 1.2 mg/dL   Alkaline Phosphatase 73  39 - 117 U/L   AST 28  0 - 37 U/L   ALT 34  0 - 53 U/L   Total Protein 7.1  6.0 - 8.3 g/dL   Albumin 4.2  3.5 - 5.2 g/dL   Calcium 9.4  8.4 - 69.6 mg/dL   GFR 29.52  >84.13 mL/min  TSH     Status: None   Collection Time    06/26/13  3:30 PM      Result Value Range   TSH 4.06  0.35 - 5.50 uIU/mL  LIPID PANEL     Status: Abnormal   Collection Time  06/26/13  3:30 PM      Result Value Range   Cholesterol 135  0 - 200 mg/dL   Comment: ATP III Classification       Desirable:  < 200 mg/dL               Borderline High:  200 - 239 mg/dL          High:  > = 784 mg/dL   Triglycerides 69.6  0.0 - 149.0 mg/dL   Comment: Normal:  <295 mg/dLBorderline High:  150 - 199 mg/dL   HDL 28.41 (*) >32.44 mg/dL   VLDL 01.0  0.0 - 27.2 mg/dL   LDL Cholesterol 88  0 - 99 mg/dL   Total CHOL/HDL Ratio 4     Comment:                Men          Women1/2 Average Risk     3.4          3.3Average Risk          5.0          4.42X Average Risk          9.6          7.13X Average Risk          15.0          11.0                            Assessment & Plan:

## 2013-06-26 NOTE — Patient Instructions (Addendum)
Good to see you. Congratulations on the successful ablation  Your exam to day is normal. You appear fit.  Labs today and results will be up on MyChart.  Return as needed or in 1 year.  Welcome To Medicare!

## 2013-06-27 ENCOUNTER — Other Ambulatory Visit: Payer: Self-pay | Admitting: Internal Medicine

## 2013-06-27 NOTE — Assessment & Plan Note (Signed)
Takes low dose ramipril.  BP Readings from Last 3 Encounters:  06/26/13 139/74  05/12/13 150/72  02/09/13 126/62   Plan BP control ok. He will check BP out of the office and report back by MyChart. If SBP consistently 140+ will increase dose of ramipril.

## 2013-06-27 NOTE — Assessment & Plan Note (Signed)
Last EGD Dec '13 - negative for Barrett's, as it was in May '11. He has been told by Dr. Christella Hartigan (per patient) that he doesn't require further surveillance and at this time he is weaning off of PPI therapy.  Plan Agree with stopping PPI  May use otc H2 blocker prn.

## 2013-06-27 NOTE — Assessment & Plan Note (Signed)
Due for follow up carotid doppler with recommendations to follow.

## 2013-06-27 NOTE — Assessment & Plan Note (Signed)
Doing great - no evidence or report of depression or anxiety.

## 2013-06-27 NOTE — Assessment & Plan Note (Signed)
Interval history is notable for ablation Feb '14 and for PTDVD place Aug '13. He is feeling well and doing well. Physical exam is normal. He is current with colorectal and prostate cancer screening. Immunizations are up to date. He does exercise on a regular basis and follows a healthy diet.  In summary A very nice man with multiple medical problems who is very stable and improved at this visit. He will return in 1 year or as needed.

## 2013-06-27 NOTE — Assessment & Plan Note (Signed)
Doing very well post-ablation. He is about to come off Eliquis. He follows with Drs. Graciela Husbands and Myrtis Ser

## 2013-06-27 NOTE — Assessment & Plan Note (Signed)
Sees Dr. Darrick Penna as needed.

## 2013-06-27 NOTE — Assessment & Plan Note (Signed)
Doing well with no complaints of nocturia, no sexual dysfunction.

## 2013-06-28 ENCOUNTER — Encounter (INDEPENDENT_AMBULATORY_CARE_PROVIDER_SITE_OTHER): Payer: Medicare Other

## 2013-06-28 DIAGNOSIS — I6529 Occlusion and stenosis of unspecified carotid artery: Secondary | ICD-10-CM | POA: Diagnosis not present

## 2013-07-05 ENCOUNTER — Other Ambulatory Visit: Payer: Self-pay

## 2013-07-20 DIAGNOSIS — L219 Seborrheic dermatitis, unspecified: Secondary | ICD-10-CM | POA: Diagnosis not present

## 2013-07-20 DIAGNOSIS — L819 Disorder of pigmentation, unspecified: Secondary | ICD-10-CM | POA: Diagnosis not present

## 2013-07-20 DIAGNOSIS — L821 Other seborrheic keratosis: Secondary | ICD-10-CM | POA: Diagnosis not present

## 2013-08-02 DIAGNOSIS — H0019 Chalazion unspecified eye, unspecified eyelid: Secondary | ICD-10-CM | POA: Diagnosis not present

## 2013-08-08 ENCOUNTER — Telehealth: Payer: Self-pay | Admitting: Internal Medicine

## 2013-08-08 NOTE — Telephone Encounter (Signed)
Pt wants to know if he had a zostavax prior to our going on EPIC.  If not he wants to schedule. He also wants to know if he did have it several years ago and it isn't in the record, will it hurt to have it again?  Can that be given with a flu shot?

## 2013-08-08 NOTE — Telephone Encounter (Signed)
No record of zostavax in EPIC. It will not hurt to have it again if he had it before, though not on record.

## 2013-08-09 NOTE — Telephone Encounter (Signed)
LMOM to inform pt °

## 2013-08-20 ENCOUNTER — Encounter: Payer: Self-pay | Admitting: Cardiology

## 2013-08-20 DIAGNOSIS — Z9229 Personal history of other drug therapy: Secondary | ICD-10-CM | POA: Insufficient documentation

## 2013-08-20 DIAGNOSIS — I071 Rheumatic tricuspid insufficiency: Secondary | ICD-10-CM | POA: Insufficient documentation

## 2013-08-20 DIAGNOSIS — I340 Nonrheumatic mitral (valve) insufficiency: Secondary | ICD-10-CM | POA: Insufficient documentation

## 2013-08-21 ENCOUNTER — Encounter: Payer: Self-pay | Admitting: Cardiology

## 2013-08-21 ENCOUNTER — Ambulatory Visit (INDEPENDENT_AMBULATORY_CARE_PROVIDER_SITE_OTHER): Payer: Medicare Other | Admitting: Cardiology

## 2013-08-21 VITALS — BP 120/74 | HR 59 | Ht 71.0 in | Wt 201.0 lb

## 2013-08-21 DIAGNOSIS — Z7901 Long term (current) use of anticoagulants: Secondary | ICD-10-CM

## 2013-08-21 DIAGNOSIS — I2510 Atherosclerotic heart disease of native coronary artery without angina pectoris: Secondary | ICD-10-CM

## 2013-08-21 DIAGNOSIS — E785 Hyperlipidemia, unspecified: Secondary | ICD-10-CM

## 2013-08-21 DIAGNOSIS — I10 Essential (primary) hypertension: Secondary | ICD-10-CM

## 2013-08-21 DIAGNOSIS — I4891 Unspecified atrial fibrillation: Secondary | ICD-10-CM | POA: Diagnosis not present

## 2013-08-21 DIAGNOSIS — Z950 Presence of cardiac pacemaker: Secondary | ICD-10-CM

## 2013-08-21 DIAGNOSIS — I779 Disorder of arteries and arterioles, unspecified: Secondary | ICD-10-CM

## 2013-08-21 MED ORDER — ATORVASTATIN CALCIUM 40 MG PO TABS: 40.0000 mg | ORAL_TABLET | Freq: Every day | ORAL | Status: DC

## 2013-08-21 MED ORDER — APIXABAN 5 MG PO TABS: 5.0000 mg | ORAL_TABLET | Freq: Two times a day (BID) | ORAL | Status: DC

## 2013-08-21 NOTE — Progress Notes (Signed)
HPI  Patient is seen today to followup atrial fibrillation and coronary artery disease and hypertension. I saw the patient last in the office December, 2013. However I been involved frequently in his care by telephone since then. He has seen Dr. Graciela Husbands. In February, 2014 he had atrial fibrillation by Dr. Delena Serve at [facility]. He's done very well. His Tikosyn has been stopped. He continues on Eliquis. There has been question that this could be stopped. It is my understanding that he did have some atrial fibrillation when interrogating his pacemaker. This information was sent to [facility] for further review. We understand through [facility], Dr Alfonso Patten assistant that we still do not have an answer concerning whether his anticoagulant can be stopped.  The patient does not feel any significant atrial fibrillation.  Allergies  Allergen Reactions  . Sulfonamide Derivatives     "have no idea; mother told me I was allergic to"    Current Outpatient Prescriptions  Medication Sig Dispense Refill  . apixaban (ELIQUIS) 5 MG TABS tablet Take 1 tablet (5 mg total) by mouth 2 (two) times daily.  60 tablet  6  . atorvastatin (LIPITOR) 40 MG tablet Take 1 tablet (40 mg total) by mouth daily.  30 tablet  6  . Calcium & Magnesium Carbonates (MYLANTA PO) Take 10 mg by mouth daily as needed.       Marland Jaye esomeprazole (NEXIUM) 40 MG capsule       . Multiple Vitamin (MULTIVITAMIN WITH MINERALS) TABS Take 1 tablet by mouth daily.      . ramipril (ALTACE) 2.5 MG capsule Take 1 capsule (2.5 mg total) by mouth daily.  30 capsule  3   No current facility-administered medications for this visit.    History   Social History  . Marital Status: Married    Spouse Name: N/A    Number of Children: 2  . Years of Education: 18   Occupational History  . [employer]   Social History Main Topics  . Smoking status: Never Smoker   . Smokeless tobacco: Never Used  . Alcohol Use: 12.6  oz/week    21 Glasses of wine per week  . Drug Use: No  . Sexual Activity: Yes    Partners: Female   Other Topics Concern  . Not on file   Social History Narrative   [employer] [place], [place].  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  2 daughters-'75, '79; 1 grandchild; step-daughter and step grandson.  Regular exercise-yes, runs 1.5-2 mi 4x/wk, also eliptical      Patient signed a Designated Party Release to allow his wife, Amato Sevillano, to have access to his medical records/ information.    Family History  Problem Relation Age of Onset  . Hyperlipidemia    . Hypertension    . Coronary artery disease Other 72  . Diabetes Other   . Colon cancer Neg Hx   . Multiple myeloma Mother   . Diabetes Father   . Renal Disease Father     Past Medical History  Diagnosis Date  . CAD (coronary artery disease)     Myoview March, 2011, excellent exercise, hypertensive response, no scar or ischemia, EF 61%, mild palpitations peak stress with infrequent PACs and PVCs.  Marland Minniefield Hyperlipidemia     Low HDL  . GERD (gastroesophageal reflux disease)     Barrett's esophagus  . Colonic polyp   . Palpitations  exercise palpitations, probably A. fib-not VT,Pindolol not tolerated, CAD, bradycardia,Tickosyn needs hospital +/_ side effects, amiodarone & sotalol could need pacer,Multaq less effective not tried, atrial fib ablation could be considered at low risk  . Foot pain     Chronic lateral foot pain-resolved with use of orthotics '09  . Aortic stenosis     Mild, echo, April, 2012  . Carotid artery disease     Doppler November, 2010, 0-39% bilateral followup 2 years  . Warfarin anticoagulation     Coumadin for atrial fib  . Aortic insufficiency      Mild, echo, 2010 / mild/moderate, echo, 2012  . Elevated bilirubin     Mild chronic elevation, 2.0 January, 2011 stable  . BPH (benign prostatic hyperplasia)   . Drug intolerance     Pindolol before  exercise not tolerated  . Ejection fraction     EF 60-65%, echo, September, 2010 / EF 60-65% echo, April, 2012  . Hypokalemia     Potassium 3.6 before potassium started April, 2012  . Hx of colonoscopy   . Chronic foot pain 2009    Resolved with use of orthotics  . Intolerance of drug     Fish oil, bilirubin, nacin  . BPH (benign prostatic hyperplasia)   . Pulmonic stenosis     Mild, echo, April, 2012  . Drug therapy     Tikosyn April, 2012  . TSH (thyroid-stimulating hormone deficiency)   . Hemorrhoids     September, 2012  . Numbness of toes     Slight numbness of one toe, September, 2012  . Lung granuloma     Left  lung chest x-ray July, 2013  . A-fib      Admission for Tikosyn load, March 20, 2011, converted after first dose, QTC 440 at the time of discharge. / Office note March 27, 2011, clinically in and out of atrial, QTC 500 ms, Dr.Klein decreased Tikosyn to 375mg  BID.  Atrial fib at rest February 25, 2011 / plan to admit for [facility] therapy  . PVC's (premature ventricular contractions)   . Atrial flutter     Status post ablation  . Arrhythmia     h/o bradycardia  . HTN (hypertension)     Controlled at rest, hypertension on treadmill, sensitive to medications  . Heart murmur     "since I was a little boy"  . MVP (mitral valve prolapse)   . Anginal pain 2000    "before bypass"  . Shortness of breath 07/15/2012    "can happen at any time; related to AF and bradycardia"  . Barrett's esophagus   . Migraines 07/15/2012    "years ago; before glasses changed"  . Primary osteoarthritis of left knee     Mild  . Mitral regurgitation   . Tricuspid regurgitation   . HX: anticoagulation     Past Surgical History  Procedure Laterality Date  . Insert / replace / remove pacemaker  07/15/2012    initial placement  . Tonsillectomy  1956  . Coronary artery bypass graft  2000    CABG X5  . Radiofrequency  ablation for heart arrythmia      Patient Active Problem List    Diagnosis Date Noted  . CAD (coronary artery disease)     Priority: High  . Hx of CABG     Priority: High  . Drug therapy     Priority: High  . Drug intolerance     Priority: High  . Oral anticoagulation  Priority: High  . Hypokalemia     Priority: High  . A-fib     Priority: High  . Mitral regurgitation   . Tricuspid regurgitation   . HX: anticoagulation   . Abdominal distention 01/23/2013  . Diffuse anterior T-wave inversions 10/24/2012  . PPM-[employer] 07/16/2012  . 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 Lung granuloma   . Shortness of breath   . Nausea   . Aortic stenosis   . Routine health maintenance 04/11/2012  . Plantar fasciitis of right foot 04/04/2012  . Tumescence 02/23/2012  . Numbness of toes   . HTN (hypertension)   . Hyperlipidemia   . Pulmonic stenosis   . Aortic insufficiency   . QT prolongation on Tikosyn 03/26/2011  . PVC's (premature ventricular contractions)   . Carotid artery disease   . Elevated bilirubin   . THYROID STIMULATING HORMONE, ABNORMAL 04/01/2010  . DERANGEMENT OF MENISCUS NOT ELSEWHERE CLASSIFIED 12/03/2009  . Knee pain 12/03/2009  . BENIGN PROSTATIC HYPERTROPHY, WITH OBSTRUCTION 09/30/2009  . Unspecified disorders of bursae and tendons in shoulder region 08/22/2009  . 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 ADENOMATOUS COLONIC POLYP 03/06/2008  . ANXIETY DEPRESSION 03/06/2008  . ESOPHAGITIS 03/06/2008  . ESOPHAGEAL STENOSIS 03/06/2008  . BARRETTS ESOPHAGUS 03/06/2008  . GASTRITIS, CHRONIC 03/06/2008  . DUODENITIS 03/06/2008  . DIVERTICULOSIS, COLON 03/06/2008  . PROSTATITIS, HX OF 03/06/2008  . OSTEOARTHRITIS, KNEE, LEFT 11/04/2007  . FOOT PAIN, LEFT 11/04/2007  . GERD 08/09/2007  . COLONIC POLYPS, HX OF 08/09/2007    ROS   Patient denies fever, chills, headache, sweats, rash, change in vision, change in hearing, chest pain, cough, nausea vomiting,  urinary symptoms. All of the systems are reviewed and are negative.  PHYSICAL EXAM  Patient is oriented to person time and place. Affect is normal. There is no jugulovenous distention. Lungs are clear. Respiratory effort is nonlabored. Cardiac exam reveals S1 and S2. There no clicks or significant murmurs. Abdomen is soft. There is no peripheral edema.  Filed Vitals:   08/21/13 1044  BP: 120/74  Pulse: 59  Height: 5\' 11"  (1.803 m)  Weight: 201 lb (91.173 kg)  SpO2: 98%     ASSESSMENT & PLAN

## 2013-08-21 NOTE — Assessment & Plan Note (Signed)
Patient is doing extremely well. He does not feel any palpitations. We do not know for sure if he is having some atrial fib at peak exercise.

## 2013-08-21 NOTE — Assessment & Plan Note (Signed)
Recently his blood pressure was elevated. There is question of raising his dose of ramapril. Blood pressure is nicely controlled today. I requested that he obtain a blood pressure cuff to use at home. He will check his pressures on a random basis and send me more information before we make final decisions about adjusting his medicines.

## 2013-08-21 NOTE — Assessment & Plan Note (Signed)
Coronary disease is stable.  No further workup is needed. 

## 2013-08-21 NOTE — Assessment & Plan Note (Signed)
His most recent LDL was 88. In general he's been well controlled on Lipitor 20. The most recent guidelines recommend intensive statins for patients with bypass surgery. This would be Lipitor 40 mg daily. I explained this to him and he agrees to try 40 mg.

## 2013-08-21 NOTE — Patient Instructions (Addendum)
Your physician has recommended you make the following change in your medication: increase Lipitor to 40 mg daily   Purchase a blood pressure monitor/cuff, monitor your blood pressure daily for a week or 2 then call office @ [phone] to report readings. Ask for Larita Fife (Dr Myrtis Ser nurse).  Your physician wants you to follow-up in: 6 months. You will receive a reminder letter in the mail two months in advance. If you don't receive a letter, please call our office to schedule the follow-up appointment.

## 2013-08-21 NOTE — Assessment & Plan Note (Signed)
The patient continues on Eliquis. I will speak with Dr. Graciela Husbands to see if he can help by speaking with Dr. Delena Serve so that they can make a definitive plan concerning anticoagulation. For today I've continue his anticoagulation for at least another month.

## 2013-08-21 NOTE — Assessment & Plan Note (Addendum)
Doppler was done July, 2014. There was slight progression in the LICA. He needs followup in one year.

## 2013-08-21 NOTE — Assessment & Plan Note (Signed)
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

## 2013-08-30 ENCOUNTER — Encounter: Payer: Self-pay | Admitting: Internal Medicine

## 2013-08-31 ENCOUNTER — Encounter: Payer: Self-pay | Admitting: Internal Medicine

## 2013-08-31 ENCOUNTER — Ambulatory Visit (INDEPENDENT_AMBULATORY_CARE_PROVIDER_SITE_OTHER)
Admission: RE | Admit: 2013-08-31 | Discharge: 2013-08-31 | Disposition: A | Discharge status: Home / Self Care | Payer: Medicare Other | Source: Ambulatory Visit | Attending: Internal Medicine | Admitting: Internal Medicine

## 2013-08-31 ENCOUNTER — Ambulatory Visit (INDEPENDENT_AMBULATORY_CARE_PROVIDER_SITE_OTHER): Payer: Medicare Other | Admitting: Internal Medicine

## 2013-08-31 ENCOUNTER — Other Ambulatory Visit (INDEPENDENT_AMBULATORY_CARE_PROVIDER_SITE_OTHER): Payer: Medicare Other

## 2013-08-31 VITALS — BP 114/68 | HR 62 | Temp 97.5°F | Resp 16 | Ht 71.0 in | Wt 196.0 lb

## 2013-08-31 DIAGNOSIS — K5900 Constipation, unspecified: Secondary | ICD-10-CM

## 2013-08-31 DIAGNOSIS — K5732 Diverticulitis of large intestine without perforation or abscess without bleeding: Secondary | ICD-10-CM | POA: Diagnosis not present

## 2013-08-31 DIAGNOSIS — R10814 Left lower quadrant abdominal tenderness: Secondary | ICD-10-CM

## 2013-08-31 LAB — CBC WITH DIFFERENTIAL/PLATELET
Basophils Absolute: 0 10*3/uL (ref 0.0–0.1)
Basophils Relative: 0.4 % (ref 0.0–3.0)
Eosinophils Absolute: 0.1 10*3/uL (ref 0.0–0.7)
HCT: 44.8 % (ref 39.0–52.0)
Hemoglobin: 15.2 g/dL (ref 13.0–17.0)
Lymphs Abs: 1.5 10*3/uL (ref 0.7–4.0)
MCHC: 33.9 g/dL (ref 30.0–36.0)
MCV: 87.5 fl (ref 78.0–100.0)
Monocytes Absolute: 1.4 10*3/uL — ABNORMAL HIGH (ref 0.1–1.0)
Neutro Abs: 5.1 10*3/uL (ref 1.4–7.7)
Neutrophils Relative %: 63 % (ref 43.0–77.0)
RBC: 5.12 Mil/uL (ref 4.22–5.81)
RDW: 13.7 % (ref 11.5–14.6)

## 2013-08-31 LAB — URINALYSIS, ROUTINE W REFLEX MICROSCOPIC
Ketones, ur: NEGATIVE
Specific Gravity, Urine: 1.025 (ref 1.000–1.030)
Total Protein, Urine: NEGATIVE
Urine Glucose: NEGATIVE
Urobilinogen, UA: 1 (ref 0.0–1.0)
pH: 6 (ref 5.0–8.0)

## 2013-08-31 MED ORDER — METRONIDAZOLE 500 MG PO TABS: 500.0000 mg | ORAL_TABLET | Freq: Three times a day (TID) | ORAL | Status: DC

## 2013-08-31 MED ORDER — CIPROFLOXACIN HCL 500 MG PO TABS: 500.0000 mg | ORAL_TABLET | Freq: Two times a day (BID) | ORAL | Status: DC

## 2013-08-31 NOTE — Patient Instructions (Signed)
Diverticulitis °A diverticulum is a small pouch or sac on the colon. Diverticulosis is the presence of these diverticula on the colon. Diverticulitis is the irritation (inflammation) or infection of diverticula. °CAUSES  °The colon and its diverticula contain bacteria. If food particles block the tiny opening to a diverticulum, the bacteria inside can grow and cause an increase in pressure. This leads to infection and inflammation and is called diverticulitis. °SYMPTOMS  °· Abdominal pain and tenderness. Usually, the pain is located on the left side of your abdomen. However, it could be located elsewhere. °· Fever. °· Bloating. °· Feeling sick to your stomach (nausea). °· Throwing up (vomiting). °· Abnormal stools. °DIAGNOSIS  °Your caregiver will take a history and perform a physical exam. Since many things can cause abdominal pain, other tests may be necessary. Tests may include: °· Blood tests. °· Urine tests. °· X-ray of the abdomen. °· CT scan of the abdomen. °Sometimes, surgery is needed to determine if diverticulitis or other conditions are causing your symptoms. °TREATMENT  °Most of the time, you can be treated without surgery. Treatment includes: °· Resting the bowels by only having liquids for a few days. As you improve, you will need to eat a low-fiber diet. °· Intravenous (IV) fluids if you are losing body fluids (dehydrated). °· Antibiotic medicines that treat infections may be given. °· Pain and nausea medicine, if needed. °· Surgery if the inflamed diverticulum has burst. °HOME CARE INSTRUCTIONS  °· Try a clear liquid diet (broth, tea, or water for as long as directed by your caregiver). You may then gradually begin a low-fiber diet as tolerated.  °A low-fiber diet is a diet with less than 10 grams of fiber. Choose the foods below to reduce fiber in the diet: °· White breads, cereals, rice, and pasta. °· Cooked fruits and vegetables or soft fresh fruits and vegetables without the skin. °· Ground or  well-cooked tender beef, ham, veal, lamb, pork, or poultry. °· Eggs and seafood. °· After your diverticulitis symptoms have improved, your caregiver may put you on a high-fiber diet. A high-fiber diet includes 14 grams of fiber for every 1000 calories consumed. For a standard 2000 calorie diet, you would need 28 grams of fiber. Follow these diet guidelines to help you increase the fiber in your diet. It is important to slowly increase the amount fiber in your diet to avoid gas, constipation, and bloating. °· Choose whole-grain breads, cereals, pasta, and brown rice. °· Choose fresh fruits and vegetables with the skin on. Do not overcook vegetables because the more vegetables are cooked, the more fiber is lost. °· Choose more nuts, seeds, legumes, dried peas, beans, and lentils. °· Look for food products that have greater than 3 grams of fiber per serving on the Nutrition Facts label. °· Take all medicine as directed by your caregiver. °· If your caregiver has given you a follow-up appointment, it is very important that you go. Not going could result in lasting (chronic) or permanent injury, pain, and disability. If there is any problem keeping the appointment, call to reschedule. °SEEK MEDICAL CARE IF:  °· Your pain does not improve. °· You have a hard time advancing your diet beyond clear liquids. °· Your bowel movements do not return to normal. °SEEK IMMEDIATE MEDICAL CARE IF:  °· Your pain becomes worse. °· You have an oral temperature above 102° F (38.9° C), not controlled by medicine. °· You have repeated vomiting. °· You have bloody or black, tarry stools. °·   Symptoms that brought you to your caregiver become worse or are not getting better. °MAKE SURE YOU:  °· Understand these instructions. °· Will watch your condition. °· Will get help right away if you are not doing well or get worse. °Document Released: 08/26/2005 Document Revised: 02/08/2012 Document Reviewed: 12/22/2010 °[employer]® Patient Information  ©2014 [employer]. ° °

## 2013-08-31 NOTE — Progress Notes (Signed)
Subjective:    Patient ID: Russell Griffith, male    DOB: [birth year], 67 y.o.   MRN: [id number]  Abdominal Pain This is a new problem. The current episode started in the past 7 days. The onset quality is gradual. The problem occurs constantly. The problem has been gradually worsening. The pain is located in the LLQ. The pain is at a severity of 2/10. The pain is mild. The quality of the pain is aching and sharp. The abdominal pain does not radiate. Associated symptoms include constipation. Pertinent negatives include no anorexia, arthralgias, belching, diarrhea, dysuria, fever, flatus, frequency, headaches, hematochezia, hematuria, melena, myalgias, nausea, vomiting or weight loss. The pain is aggravated by movement and palpation. The pain is relieved by nothing. He has tried nothing for the symptoms. There is no history of abdominal surgery, colon cancer, Crohn's disease, gallstones, GERD, irritable bowel syndrome, pancreatitis, PUD or ulcerative colitis. prior evidence of diverticulosis on colonoscopy       Review of Systems  Constitutional: Positive for fatigue. Negative for fever, chills, weight loss, diaphoresis, activity change, appetite change and unexpected weight change.  HENT: Negative.   Eyes: Negative.   Respiratory: Negative.  Negative for cough, chest tightness, shortness of breath, wheezing and stridor.   Cardiovascular: Negative.  Negative for chest pain, palpitations and leg swelling.  Gastrointestinal: Positive for abdominal pain and constipation. Negative for nausea, vomiting, diarrhea, blood in stool, melena, hematochezia, abdominal distention, anal bleeding, rectal pain, anorexia and flatus.  Endocrine: Negative.   Genitourinary: Negative.  Negative for dysuria, urgency, frequency, hematuria, flank pain, decreased urine volume, scrotal swelling, difficulty urinating and testicular pain.  Musculoskeletal: Negative.  Negative for myalgias and arthralgias.  Skin: Negative.    Allergic/Immunologic: Negative.   Neurological: Negative for headaches.  Hematological: Negative.  Negative for adenopathy. Does not bruise/bleed easily.  Psychiatric/Behavioral: Negative.        Objective:   Physical Exam  Vitals reviewed. Constitutional: He is oriented to person, place, and time. He appears well-developed and well-nourished.  Non-toxic appearance. He does not have a sickly appearance. He does not appear ill. No distress.  HENT:  Head: Normocephalic and atraumatic.  Mouth/Throat: Oropharynx is clear and moist. No oropharyngeal exudate.  Eyes: Conjunctivae are normal. Right eye exhibits no discharge. Left eye exhibits no discharge. No scleral icterus.  Neck: Normal range of motion. Neck supple. No JVD present. No tracheal deviation present. No thyromegaly present.  Cardiovascular: Normal rate, regular rhythm, normal heart sounds and intact distal pulses.  Exam reveals no gallop and no friction rub.   No murmur heard. Pulmonary/Chest: Effort normal and breath sounds normal. No stridor. No respiratory distress. He has no wheezes. He has no rales. He exhibits no tenderness.  Abdominal: Soft. Normal appearance. He exhibits no distension, no ascites and no mass. Bowel sounds are decreased. There is no hepatosplenomegaly, splenomegaly or hepatomegaly. There is tenderness in the left lower quadrant. There is no rebound, no guarding, no CVA tenderness, no tenderness at McBurney's point and negative Murphy's sign. No hernia. Hernia confirmed negative in the ventral area, confirmed negative in the right inguinal area and confirmed negative in the left inguinal area.  Genitourinary: Rectum normal, testes normal and penis normal. Rectal exam shows no external hemorrhoid, no internal hemorrhoid, no fissure, no mass, no tenderness and anal tone normal. Guaiac negative stool. Prostate is not enlarged and not tender. Right testis shows no mass, no swelling and no tenderness. Right testis is  descended. Left testis shows no mass, no  swelling and no tenderness. Left testis is descended. Circumcised. No penile erythema or penile tenderness. No discharge found.  Musculoskeletal: Normal range of motion. He exhibits no edema and no tenderness.  Lymphadenopathy:    He has no cervical adenopathy.       Right: No inguinal adenopathy present.       Left: No inguinal adenopathy present.  Neurological: He is oriented to person, place, and time.  Skin: Skin is warm and dry. No rash noted. He is not diaphoretic. No erythema. No pallor.  Psychiatric: He has a normal mood and affect. His behavior is normal. Judgment and thought content normal.      Lab Results  Component Value Date   WBC 6.8 07/13/2012   HGB 15.7 07/13/2012   HCT 46.0 07/13/2012   PLT 197 07/13/2012   GLUCOSE 93 06/26/2013   CHOL 135 06/26/2013   TRIG 53.0 06/26/2013   HDL 36.30* 06/26/2013   LDLCALC 88 06/26/2013   ALT 34 06/26/2013   ALT 34 06/26/2013   AST 28 06/26/2013   AST 28 06/26/2013   NA 138 06/26/2013   K 4.2 06/26/2013   CL 104 06/26/2013   CREATININE 1.1 06/26/2013   BUN 19 06/26/2013   CO2 30 06/26/2013   TSH 4.06 06/26/2013   PSA 1.09 04/04/2012   INR 1.53* 07/15/2012      Assessment & Plan:

## 2013-09-01 ENCOUNTER — Encounter: Payer: Self-pay | Admitting: Internal Medicine

## 2013-09-01 ENCOUNTER — Ambulatory Visit (INDEPENDENT_AMBULATORY_CARE_PROVIDER_SITE_OTHER)
Admission: RE | Admit: 2013-09-01 | Discharge: 2013-09-01 | Disposition: A | Discharge status: Home / Self Care | Payer: Medicare Other | Source: Ambulatory Visit | Attending: Internal Medicine | Admitting: Internal Medicine

## 2013-09-01 ENCOUNTER — Ambulatory Visit (INDEPENDENT_AMBULATORY_CARE_PROVIDER_SITE_OTHER): Payer: Medicare Other | Admitting: *Deleted

## 2013-09-01 DIAGNOSIS — F341 Dysthymic disorder: Secondary | ICD-10-CM

## 2013-09-01 DIAGNOSIS — R10814 Left lower quadrant abdominal tenderness: Secondary | ICD-10-CM

## 2013-09-01 DIAGNOSIS — K5732 Diverticulitis of large intestine without perforation or abscess without bleeding: Secondary | ICD-10-CM

## 2013-09-01 LAB — COMPREHENSIVE METABOLIC PANEL
ALT: 25 U/L (ref 0–53)
AST: 26 U/L (ref 0–37)
Albumin: 4.1 g/dL (ref 3.5–5.2)
Alkaline Phosphatase: 74 U/L (ref 39–117)
Glucose, Bld: 97 mg/dL (ref 70–99)
Potassium: 4.4 mEq/L (ref 3.5–5.1)
Sodium: 139 mEq/L (ref 135–145)
Total Bilirubin: 2.4 mg/dL — ABNORMAL HIGH (ref 0.3–1.2)
Total Protein: 7 g/dL (ref 6.0–8.3)

## 2013-09-01 LAB — AMYLASE: Amylase: 58 U/L (ref 27–131)

## 2013-09-01 MED ORDER — IOHEXOL 300 MG/ML  SOLN
100.0000 mL | Freq: Once | INTRAMUSCULAR | Status: AC | PRN
  Administered 2013-09-01: 100 mL via INTRAVENOUS

## 2013-09-01 NOTE — Progress Notes (Signed)
Interrogation only for atrial high rates.  2 AHR episodes9-26 seconds.  Per Dr. Graciela Husbands patient can stop his eliquis. Follow up as scheduled.

## 2013-09-01 NOTE — Assessment & Plan Note (Signed)
His plain film is normal ( no stones, free air, sbo, ileus ) I will check his urine to look for infection, blood, protein Will also check his labs to look for hepatitis, pancreatitis, anemia, renal failure

## 2013-09-01 NOTE — Assessment & Plan Note (Signed)
He will start cipro and flagyl I will check his lab today and have ordered a CT scan to see how severe this is

## 2013-09-04 LAB — PACEMAKER DEVICE OBSERVATION
AL AMPLITUDE: 4.2 mv
DEVICE MODEL PM: 111255
RV LEAD IMPEDENCE PM: 506 Ohm

## 2013-09-07 ENCOUNTER — Encounter: Payer: Self-pay | Admitting: Internal Medicine

## 2013-09-13 ENCOUNTER — Ambulatory Visit (INDEPENDENT_AMBULATORY_CARE_PROVIDER_SITE_OTHER): Payer: Medicare Other | Admitting: Internal Medicine

## 2013-09-13 ENCOUNTER — Encounter: Payer: Self-pay | Admitting: Internal Medicine

## 2013-09-13 VITALS — BP 110/70 | HR 57 | Temp 98.2°F | Wt 198.0 lb

## 2013-09-13 DIAGNOSIS — Z0000 Encounter for general adult medical examination without abnormal findings: Secondary | ICD-10-CM | POA: Diagnosis not present

## 2013-09-13 DIAGNOSIS — K5732 Diverticulitis of large intestine without perforation or abscess without bleeding: Secondary | ICD-10-CM

## 2013-09-13 DIAGNOSIS — Z2911 Encounter for prophylactic immunotherapy for respiratory syncytial virus (RSV): Secondary | ICD-10-CM | POA: Diagnosis not present

## 2013-09-13 DIAGNOSIS — Z23 Encounter for immunization: Secondary | ICD-10-CM | POA: Diagnosis not present

## 2013-09-13 NOTE — Progress Notes (Signed)
Subjective:     Patient ID: Russell Griffith, male   DOB: [birth year], 67 y.o.   MRN: [id number]  HPI Mr. Silveria is a 67 year old male with a history of a-fib, status post atrial ablation who presents for follow-up after being diagnosed with diverticulitis by CT and taking a 10 day course of ciprofloxacin and a 10 day course of  flagyll. The abdominal pain has resolved. He did have some constipation as a side effect of the antibiotics, and took some senna which resolved the constipation. No blood in the stool. No fever, chills, or night sweats. His appetite has returned.   He is interested in recommendations for a higher fiber diet.   He also recently got approval to go off apixaban for status post ablation for a-fib. No feelings of palpitations or heart racing. No fatigue. No DOE. No chest pain.   In regards to healthcare maintenance:  --Last colonoscopy was with Dr. Christella Hartigan in 2012. Thinks he is on a 5 year schedule for some abnormal findings.  --Had a flu shot 1-2 weeks ago.   Review of Systems Constitutional:  Negative for fever, chills, activity change and unexpected weight change.  HEENT:  Negative for hearing loss, ear pain, congestion, neck stiffness and postnasal drip. Negative for sore throat or swallowing problems. Negative for dental complaints.   Eyes: Negative for vision loss or change in visual acuity.  Respiratory: Negative for chest tightness and wheezing. Negative for DOE.   Cardiovascular: Negative for chest pain or palpitations. No decreased exercise tolerance Gastrointestinal: No change in bowel habit. No bloating or gas. No reflux or indigestion Genitourinary: Negative for urgency, frequency, flank pain and difficulty urinating. Awakening to urinate 1-2 x a night, patient feels this is well-controlled.  Musculoskeletal: Negative for myalgias, back pain, arthralgias and gait problem.  Neurological: Negative for dizziness and headaches.  Psychiatric/Behavioral: Negative for  behavioral problems and dysphoric mood.     Objective:   Physical Exam Filed Vitals:   09/13/13 1433  BP: 110/70  Pulse: 57  Temp: 98.2 F (36.8 C)   General: Sitting in exam room in NAD.  CV: RRR, possible 2/6 diastolic murmur Pulm: CTAB Abdominal: Normal active bowel sounds. Soft, non-tender, non-distended.   Current Outpatient Prescriptions on File Prior to Visit  Medication Sig Dispense Refill  . Calcium & Magnesium Carbonates (MYLANTA PO) Take 10 mg by mouth daily as needed.       [place] esomeprazole (NEXIUM) 40 MG capsule Take 20 mg by mouth.       . Multiple Vitamin (MULTIVITAMIN WITH MINERALS) TABS Take 1 tablet by mouth daily.      . ramipril (ALTACE) 2.5 MG capsule Take 1 capsule (2.5 mg total) by mouth daily.  30 capsule  3   No current facility-administered medications on file prior to visit.       Assessment:     Mr. Axel is a 67 year old man with a history of atrial fibrillation s/p ablation, HTN, CAD, aortic stenosis, mitral and tricuspid regurgitation, and QT prolongation who presents for follow-up after being diagnosed with diverticulitis by CT scan earlier this month. Since having a 10 day course of ciprofloxacin and metronidazole, his symptoms have resolved.     Plan:     See plan by problem list.

## 2013-09-13 NOTE — Patient Instructions (Addendum)
Varicella Zoster (Shingles) & Prevnar 13 shots today. You will be due for the next dose of Pneumovax next year.   High-Fiber Diet Fiber is found in fruits, vegetables, and grains. A high-fiber diet encourages the addition of more whole grains, legumes, fruits, and vegetables in your diet. The recommended amount of fiber for adult males is 38 g per day. For adult females, it is 25 g per day. Pregnant and lactating women should get 28 g of fiber per day. If you have a digestive or bowel problem, ask your caregiver for advice before adding high-fiber foods to your diet. Eat a variety of high-fiber foods instead of only a select few type of foods.  PURPOSE  To increase stool bulk.  To make bowel movements more regular to prevent constipation.  To lower cholesterol.  To prevent overeating. WHEN IS THIS DIET USED?  It may be used if you have constipation and hemorrhoids.  It may be used if you have uncomplicated diverticulosis (intestine condition) and irritable bowel syndrome.  It may be used if you need help with weight management.  It may be used if you want to add it to your diet as a protective measure against atherosclerosis, diabetes, and cancer. SOURCES OF FIBER  Whole-grain breads and cereals.  Fruits, such as apples, oranges, bananas, berries, prunes, and pears.  Vegetables, such as green peas, carrots, sweet potatoes, beets, broccoli, cabbage, spinach, and artichokes.  Legumes, such split peas, soy, lentils.  Almonds. FIBER CONTENT IN FOODS Starches and Grains / Dietary Fiber (g)  Cheerios, 1 cup / 3 g  Corn Flakes cereal, 1 cup / 0.7 g  Rice crispy treat cereal, 1 cup / 0.3 g  Instant oatmeal (cooked),  cup / 2 g  Frosted wheat cereal, 1 cup / 5.1 g  Brown, long-grain rice (cooked), 1 cup / 3.5 g  White, long-grain rice (cooked), 1 cup / 0.6 g  Enriched macaroni (cooked), 1 cup / 2.5 g Legumes / Dietary Fiber (g)  Baked beans (canned, plain, or  vegetarian),  cup / 5.2 g  Kidney beans (canned),  cup / 6.8 g  Pinto beans (cooked),  cup / 5.5 g Breads and Crackers / Dietary Fiber (g)  Plain or honey graham crackers, 2 squares / 0.7 g  Saltine crackers, 3 squares / 0.3 g  Plain, salted pretzels, 10 pieces / 1.8 g  Whole-wheat bread, 1 slice / 1.9 g  White bread, 1 slice / 0.7 g  Raisin bread, 1 slice / 1.2 g  Plain bagel, 3 oz / 2 g  Flour tortilla, 1 oz / 0.9 g  Corn tortilla, 1 small / 1.5 g  Hamburger or hotdog bun, 1 small / 0.9 g Fruits / Dietary Fiber (g)  Apple with skin, 1 medium / 4.4 g  Sweetened applesauce,  cup / 1.5 g  Banana,  medium / 1.5 g  Grapes, 10 grapes / 0.4 g  Orange, 1 small / 2.3 g  Raisin, 1.5 oz / 1.6 g  Melon, 1 cup / 1.4 g Vegetables / Dietary Fiber (g)  Green beans (canned),  cup / 1.3 g  Carrots (cooked),  cup / 2.3 g  Broccoli (cooked),  cup / 2.8 g  Peas (cooked),  cup / 4.4 g  Mashed potatoes,  cup / 1.6 g  Lettuce, 1 cup / 0.5 g  Corn (canned),  cup / 1.6 g  Tomato,  cup / 1.1 g Document Released: 11/16/2005 Document Revised: 05/17/2012 Document Reviewed: 02/18/2012  [employer] Patient Information 2014 [employer].  

## 2013-09-13 NOTE — Assessment & Plan Note (Signed)
Given VZV and Prevnar 13 vaccinations today. Will next be due for Pneumovax in 1 year.

## 2013-09-13 NOTE — Assessment & Plan Note (Signed)
All symptoms have resolved since the 10 day courses of ciprofloxacin and metronidazole. On physical exam, normal active bowel sounds and no tenderness to palpation.   Plan:  Patient given information about a high fiber diet. Also recommended to return early if any similar symptoms of abdominal pain.

## 2013-09-25 ENCOUNTER — Encounter: Payer: Self-pay | Admitting: Internal Medicine

## 2013-10-05 ENCOUNTER — Other Ambulatory Visit: Payer: Self-pay

## 2013-12-26 DIAGNOSIS — H524 Presbyopia: Secondary | ICD-10-CM | POA: Diagnosis not present

## 2013-12-26 DIAGNOSIS — H40019 Open angle with borderline findings, low risk, unspecified eye: Secondary | ICD-10-CM | POA: Diagnosis not present

## 2013-12-26 DIAGNOSIS — H5200 Hypermetropia, unspecified eye: Secondary | ICD-10-CM | POA: Diagnosis not present

## 2013-12-26 DIAGNOSIS — H52229 Regular astigmatism, unspecified eye: Secondary | ICD-10-CM | POA: Diagnosis not present

## 2014-03-05 ENCOUNTER — Encounter: Payer: Self-pay | Admitting: Cardiology

## 2014-03-05 DIAGNOSIS — R9430 Abnormal result of cardiovascular function study, unspecified: Secondary | ICD-10-CM | POA: Insufficient documentation

## 2014-03-06 ENCOUNTER — Encounter: Payer: Self-pay | Admitting: Cardiology

## 2014-03-06 ENCOUNTER — Ambulatory Visit (INDEPENDENT_AMBULATORY_CARE_PROVIDER_SITE_OTHER): Payer: Medicare Other | Admitting: Cardiology

## 2014-03-06 ENCOUNTER — Other Ambulatory Visit: Payer: Self-pay

## 2014-03-06 VITALS — BP 124/62 | HR 58 | Ht 71.0 in | Wt 202.4 lb

## 2014-03-06 DIAGNOSIS — I779 Disorder of arteries and arterioles, unspecified: Secondary | ICD-10-CM

## 2014-03-06 DIAGNOSIS — I2510 Atherosclerotic heart disease of native coronary artery without angina pectoris: Secondary | ICD-10-CM

## 2014-03-06 DIAGNOSIS — I4891 Unspecified atrial fibrillation: Secondary | ICD-10-CM | POA: Diagnosis not present

## 2014-03-06 DIAGNOSIS — I739 Peripheral vascular disease, unspecified: Secondary | ICD-10-CM

## 2014-03-06 DIAGNOSIS — E785 Hyperlipidemia, unspecified: Secondary | ICD-10-CM | POA: Diagnosis not present

## 2014-03-06 NOTE — Assessment & Plan Note (Signed)
The patient will need to have his followup carotid Doppler in July, 2015.

## 2014-03-06 NOTE — Progress Notes (Signed)
Patient ID: Russell Griffith, male   DOB: 1946-10-19, 68 y.o.   MRN: [id number]    HPI  Patient is seen today for followup coronary disease and atrial fibrillation. He is doing well. He had an atrial fibrillation that was successful. His anticoagulation was eventually stopped. He does not think he has had any atrial fibrillation since the procedure. He's not having any chest pain.  Allergies  Allergen Reactions  . Sulfonamide Derivatives     "have no idea; mother told me I was allergic to"    Current Outpatient Prescriptions  Medication Sig Dispense Refill  . aspirin 81 MG tablet Take 81 mg by mouth daily.      Marland V[place] atorvastatin (LIPITOR) 40 MG tablet Take 20 mg by mouth daily.      . Calcium & Magnesium Carbonates (MYLANTA PO) Take 10 mg by mouth daily as needed.       Marland Guyett esomeprazole (NEXIUM) 40 MG capsule Take 20 mg by mouth.       . Multiple Vitamin (MULTIVITAMIN WITH MINERALS) TABS Take 1 tablet by mouth daily.      . ramipril (ALTACE) 2.5 MG capsule Take 1 capsule (2.5 mg total) by mouth daily.  30 capsule  3   No current facility-administered medications for this visit.    History   Social History  . Marital Status: Married    Spouse Name: N/A    Number of Children: 2  . Years of Education: 18   Occupational History  . [employer]   Social History Main Topics  . Smoking status: Never Smoker   . Smokeless tobacco: Never Used  . Alcohol Use: 12.6 oz/week    21 Glasses of wine per week  . Drug Use: No  . Sexual Activity: Yes    Partners: Female   Other Topics Concern  . Not on file   Social History Narrative   [employer] [place] [place], [place].  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  2 daughters-'75, '79; 1 grandchild; step-daughter and step grandson.  Regular exercise-yes, runs 1.5-2 mi 4x/wk, also eliptical      Patient signed a Designated Party Release to allow his wife, Lucciano Vitali, to have  access to his medical records/ information.    Family History  Problem Relation Age of Onset  . Hyperlipidemia    . Hypertension    . Coronary artery disease Other 70  . Diabetes Other   . Colon cancer Neg Hx   . Multiple myeloma Mother   . Diabetes Father   . Renal Disease Father     Past Medical History  Diagnosis Date  . CAD (coronary artery disease)     Myoview March, 2011, excellent exercise, hypertensive response, no scar or ischemia, EF 61%, mild palpitations peak stress with infrequent PACs and PVCs.  Marland Mooney Hyperlipidemia     Low HDL  . GERD (gastroesophageal reflux disease)     Barrett's esophagus  . Colonic polyp   . Palpitations      exercise palpitations, probably A. fib-not VT,Pindolol not tolerated, CAD, bradycardia,Tickosyn needs hospital +/_ side effects, amiodarone & sotalol could need pacer,Multaq less effective not tried, atrial fib ablation could be considered at low risk  . Foot pain     Chronic lateral foot pain-resolved with use of orthotics '09  . Aortic stenosis     Mild, echo, April, 2012  . Carotid artery disease     Doppler November,  2010, 0-39% bilateral followup 2 years  . Warfarin anticoagulation     Coumadin for atrial fib  . Aortic insufficiency      Mild, echo, 2010 / mild/moderate, echo, 2012  . Elevated bilirubin     Mild chronic elevation, 2.0 January, 2011 stable  . BPH (benign prostatic hyperplasia)   . Drug intolerance     Pindolol before exercise not tolerated  . Ejection fraction     EF 60-65%, echo, September, 2010 / EF 60-65% echo, April, 2012  . Hypokalemia     Potassium 3.6 before potassium started April, 2012  . Hx of colonoscopy   . Chronic foot pain 2009    Resolved with use of orthotics  . Intolerance of drug     Fish oil, bilirubin, nacin  . BPH (benign prostatic hyperplasia)   . Pulmonic stenosis     Mild, echo, April, 2012  . TSH (thyroid-stimulating hormone deficiency)   . Hemorrhoids     September, 2012  .  Numbness of toes     Slight numbness of one toe, September, 2012  . Lung granuloma     Left  lung chest x-ray July, 2013  . A-fib      Admission for Tikosyn load, March 20, 2011, converted after first dose, QTC 440 at the time of discharge. / Office note March 27, 2011, clinically in and out of atrial, QTC 500 ms, Dr.Klein decreased Tikosyn to 332m BID.  Atrial fib at rest February 25, 2011 / [facility]  . PVC's (premature ventricular contractions)   . Atrial flutter     Status post ablation  . HTN (hypertension)     Controlled at rest, hypertension on treadmill, sensitive to medications  . Shortness of breath 07/15/2012    "can happen at any time; related to AF and bradycardia"  . Barrett's esophagus   . Migraines 07/15/2012    "years ago; before glasses changed"  . Primary osteoarthritis of left knee     Mild  . Mitral regurgitation   . Tricuspid regurgitation   . HX: anticoagulation     Past Surgical History  Procedure Laterality Date  . Insert / replace / remove pacemaker  07/15/2012    initial placement  . Tonsillectomy  1956  . Coronary artery bypass graft  2000    CABG X5  . Radiofrequency  ablation for heart arrythmia      Patient Active Problem List   Diagnosis Date Noted  . CAD (coronary artery disease)     Priority: High  . Hx of CABG     Priority: High  . Drug therapy     Priority: High  . Drug intolerance     Priority: High  . Oral anticoagulation     Priority: High  . Hypokalemia     Priority: High  . A-fib     Priority: High  . Ejection fraction   . Diverticulitis of colon without hemorrhage 08/31/2013  . Mitral regurgitation   . Tricuspid regurgitation   . HX: anticoagulation   . Abdominal distention 01/23/2013  . Diffuse anterior T-wave inversions 10/24/2012  . PPM-[employer] 07/16/2012  . 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 Lung granuloma   . Shortness of breath     . Nausea   . Aortic stenosis   . Routine health maintenance 04/11/2012  . Plantar fasciitis of right foot 04/04/2012  . Tumescence 02/23/2012  . Numbness of toes   . HTN (  hypertension)   . Hyperlipidemia   . Pulmonic stenosis   . Aortic insufficiency   . QT prolongation on Tikosyn 03/26/2011  . PVC's (premature ventricular contractions)   . Carotid artery disease   . Elevated bilirubin   . THYROID STIMULATING HORMONE, ABNORMAL 04/01/2010  . DERANGEMENT OF MENISCUS NOT ELSEWHERE CLASSIFIED 12/03/2009  . Knee pain 12/03/2009  . BENIGN PROSTATIC HYPERTROPHY, WITH OBSTRUCTION 09/30/2009  . Unspecified disorders of bursae and tendons in shoulder region 08/22/2009  . 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 ADENOMATOUS COLONIC POLYP 03/06/2008  . ANXIETY DEPRESSION 03/06/2008  . ESOPHAGITIS 03/06/2008  . ESOPHAGEAL STENOSIS 03/06/2008  . BARRETTS ESOPHAGUS 03/06/2008  . GASTRITIS, CHRONIC 03/06/2008  . DUODENITIS 03/06/2008  . DIVERTICULOSIS, COLON 03/06/2008  . PROSTATITIS, HX OF 03/06/2008  . OSTEOARTHRITIS, KNEE, LEFT 11/04/2007  . FOOT PAIN, LEFT 11/04/2007  . GERD 08/09/2007  . COLONIC POLYPS, HX OF 08/09/2007    ROS   Patient denies fever, chills, headache, sweats, rash, change in vision, change in hearing, chest pain, cough, nausea vomiting, urinary symptoms. All other systems are reviewed and are negative.  PHYSICAL EXAM   Patient is oriented to person time and place. Affect is normal. There is no jugulovenous tension. Lungs are clear. Respiratory effort is not labored. Cardiac exam reveals S1 and S2. Abdomen is soft. There is no peripheral edema.  Filed Vitals:   03/06/14 1458  BP: 124/62  Pulse: 58  Height: _0  (1.803 m)  Weight: 202 lb 6.4 oz (91.808 kg)  SpO2: 98%   EKG is done today and reviewed by me.  There are mild nonspecific ST-T wave changes. There is atrial pacing with conducted P waves.  ASSESSMENT & PLAN

## 2014-03-06 NOTE — Assessment & Plan Note (Signed)
He is receiving guidelines directed therapy for his dyslipidemia.

## 2014-03-06 NOTE — Patient Instructions (Signed)
Your physician recommends that you continue on your current medications as directed. Please refer to the Current Medication list given to you today.  Your physician wants you to follow-up in: 6 months. You will receive a reminder letter in the mail two months in advance. If you don't receive a letter, please call our office to schedule the follow-up appointment.  

## 2014-03-06 NOTE — Assessment & Plan Note (Signed)
Coronary disease is stable. There is no need for exercise testing at this time.

## 2014-03-06 NOTE — Assessment & Plan Note (Signed)
The patient is holding sinus rhythm since his atrial fibrillation. He is off anticoagulation. He does not require antiarrhythmics. His rhythm is paced.

## 2014-04-04 ENCOUNTER — Other Ambulatory Visit: Payer: Self-pay | Admitting: Cardiology

## 2014-04-17 ENCOUNTER — Other Ambulatory Visit: Payer: Self-pay | Admitting: Cardiology

## 2014-04-24 ENCOUNTER — Other Ambulatory Visit (HOSPITAL_COMMUNITY): Payer: Self-pay | Admitting: Cardiology

## 2014-04-24 DIAGNOSIS — I6529 Occlusion and stenosis of unspecified carotid artery: Secondary | ICD-10-CM

## 2014-04-30 ENCOUNTER — Ambulatory Visit (HOSPITAL_COMMUNITY): Payer: Medicare Other | Attending: Cardiovascular Disease | Admitting: Cardiology

## 2014-04-30 DIAGNOSIS — I6529 Occlusion and stenosis of unspecified carotid artery: Secondary | ICD-10-CM | POA: Diagnosis not present

## 2014-04-30 NOTE — Progress Notes (Signed)
Carotid duplex complete 

## 2014-05-14 ENCOUNTER — Encounter: Payer: Self-pay | Admitting: Internal Medicine

## 2014-05-14 ENCOUNTER — Encounter: Payer: Self-pay | Admitting: Cardiology

## 2014-05-14 ENCOUNTER — Ambulatory Visit (INDEPENDENT_AMBULATORY_CARE_PROVIDER_SITE_OTHER): Payer: Medicare Other | Admitting: Internal Medicine

## 2014-05-14 VITALS — BP 120/72 | HR 55 | Ht 71.0 in | Wt 202.0 lb

## 2014-05-14 DIAGNOSIS — I4891 Unspecified atrial fibrillation: Secondary | ICD-10-CM | POA: Diagnosis not present

## 2014-05-14 DIAGNOSIS — I2510 Atherosclerotic heart disease of native coronary artery without angina pectoris: Secondary | ICD-10-CM | POA: Diagnosis not present

## 2014-05-14 DIAGNOSIS — I495 Sick sinus syndrome: Secondary | ICD-10-CM | POA: Diagnosis not present

## 2014-05-14 DIAGNOSIS — Z950 Presence of cardiac pacemaker: Secondary | ICD-10-CM | POA: Diagnosis not present

## 2014-05-14 NOTE — Progress Notes (Signed)
Patient Care Team: Russell Anger, MD as PCP - General (Internal Medicine) Russell Bjornstad, MD (Cardiology) Russell Libel, MD (Family Medicine) Russell Sprang, MD (Cardiology) Russell Mince, MD as Consulting Physician (Dermatology)   HPI  Russell Griffith is a 68 y.o. male Seen in followup for atrial fibrillation for which she underwent pulmonary vein isolation at [facility]. He is also status post pacemaker implantation for significant pausing in the context of intermittent atrial fibrillation and also significant sinus node dysfunction. this was addressed by activation rate response of his pacemaker April 2014  Is a history of coronary artery disease with bypass surgery 2000 and a Myoview November 2013 demonstrating no ischemia or scar. LV function was normal by echo February 2014  He has done well.  He has been complaining of leg discomfort and some abdominal discomfort that began after his Lipitor was increased from 20--40. Last week on his own he decreased it from 40--20 with a marked amelioration in his symptoms.  He also notes that with exercise his peak heart rate is about 115 about how far he tries. He is satisfied with this.  Past Medical History  Diagnosis Date  . CAD (coronary artery disease)     Myoview March, 2011, excellent exercise, hypertensive response, no scar or ischemia, EF 61%, mild palpitations peak stress with infrequent PACs and PVCs.  Russell Griffith Hyperlipidemia     Low HDL  . GERD (gastroesophageal reflux disease)     Barrett's esophagus  . Colonic polyp   . Palpitations      exercise palpitations, probably A. fib-not VT,Pindolol not tolerated, CAD, bradycardia,Tickosyn needs hospital +/_ side effects, amiodarone & sotalol could need pacer,Multaq less effective not tried, atrial fib ablation could be considered at low risk  . Foot pain     Chronic lateral foot pain-resolved with use of orthotics '09  . Aortic stenosis     Mild, echo, April, 2012  . Carotid artery disease      Doppler November, 2010, 0-39% bilateral followup 2 years  . Warfarin anticoagulation     Coumadin for atrial fib  . Aortic insufficiency      Mild, echo, 2010 / mild/moderate, echo, 2012  . Elevated bilirubin     Mild chronic elevation, 2.0 January, 2011 stable  . BPH (benign prostatic hyperplasia)   . Drug intolerance     Pindolol before exercise not tolerated  . Ejection fraction     EF 60-65%, echo, September, 2010 / EF 60-65% echo, April, 2012  . Hypokalemia     Potassium 3.6 before potassium started April, 2012  . Hx of colonoscopy   . Chronic foot pain 2009    Resolved with use of orthotics  . Intolerance of drug     Fish oil, bilirubin, nacin  . BPH (benign prostatic hyperplasia)   . Pulmonic stenosis     Mild, echo, April, 2012  . TSH (thyroid-stimulating hormone deficiency)   . Hemorrhoids     September, 2012  . Numbness of toes     Slight numbness of one toe, September, 2012  . Lung granuloma     Left  lung chest x-ray July, 2013  . A-fib      Admission for Tikosyn load, March 20, 2011, converted after first dose, QTC 440 at the time of discharge. / Office note March 27, 2011, clinically in and out of atrial, QTC 500 ms, Dr.Klein decreased Tikosyn to 375mg  BID.  Atrial fib at rest February 25, 2011 /  plan to admit for Tickosyn therapy  . PVC's (premature ventricular contractions)   . Atrial flutter     Status post ablation  . HTN (hypertension)     Controlled at rest, hypertension on treadmill, sensitive to medications  . Shortness of breath 07/15/2012    "can happen at any time; related to AF and bradycardia"  . Barrett's esophagus   . Migraines 07/15/2012    "years ago; before glasses changed"  . Primary osteoarthritis of left knee     Mild  . Mitral regurgitation   . Tricuspid regurgitation   . HX: anticoagulation     Past Surgical History  Procedure Laterality Date  . Insert / replace / remove pacemaker  07/15/2012    initial placement  . Tonsillectomy   1956  . Coronary artery bypass graft  2000    CABG X5  . Radiofrequency  ablation for heart arrythmia      Current Outpatient Prescriptions  Medication Sig Dispense Refill  . aspirin 81 MG tablet Take 81 mg by mouth daily.      Russell Griffith atorvastatin (LIPITOR) 40 MG tablet TAKE 1 TABLET ONCE DAILY.  30 tablet  5  . Calcium & Magnesium Carbonates (MYLANTA PO) Take 10 mg by mouth daily as needed.       Russell Russell Griffith (NEXIUM) 40 MG capsule Take 20 mg by mouth.       . Multiple Vitamin (MULTIVITAMIN WITH MINERALS) TABS Take 1 tablet by mouth daily.      . ramipril (ALTACE) 2.5 MG capsule TAKE (1) CAPSULE DAILY.  30 capsule  5   No current facility-administered medications for this visit.    Allergies  Allergen Reactions  . Sulfonamide Derivatives     "have no idea; mother told me I was allergic to"    Review of Systems negative except from HPI and PMH  Physical Exam BP 120/72  Pulse 55  Ht 5\' 11"  (1.803 m)  Wt 202 lb (91.627 kg)  BMI 28.19 kg/m2 Well developed and nourished in no acute distress HENT normal Neck supple with JVP-flat Clear Regular rate and rhythm, no murmurs or gallops Abd-soft with active BS No Clubbing cyanosis edema Skin-warm and dry A & Oriented  Grossly normal sensory and motor function  ECG demonstrates atrial pacing at 55 Intervals 21/09/43  Assessment and  Plan  Atrial fibrillation  Sinus node dysfunction  Ischemic heart disease with prior bypass surgery  Muscle fatigue  Pacemaker  [employer]  There are no symptoms of angina  Chronotropic incompetence with a peak heart rate of 115 is not bothering him so we will leave that alone  His fatigue and aches may be related to statin. Will have to undertake a two-week exclusion to see how it is that he feels and then to resume his statin.  he will let us know by e-mail how he is feeling.  There has been no significant intercurrent atrial fibrillation

## 2014-05-14 NOTE — Patient Instructions (Addendum)
Your physician has recommended you make the following change in your medication:  1) HOLD Lipitor for 2 weeks to see if improvement in fatigue/aches. Please call us at the end of this trial to let us know progress.  Remote monitoring is used to monitor your Pacemaker of ICD from home. This monitoring reduces the number of office visits required to check your device to one time per year. It allows [place] to keep an eye on the functioning of your device to ensure it is working properly. You are scheduled for a device check from home on 08/13/14. You may send your transmission at any time that day. If you have a wireless device, the transmission will be sent automatically. After your physician reviews your transmission, you will receive a postcard with your next transmission date.  Your physician wants you to follow-up in: 1 year with Dr. Caryl Griffith.  You will receive a reminder letter in the mail two months in advance. If you don't receive a letter, please call our office to schedule the follow-up appointment.

## 2014-05-16 ENCOUNTER — Encounter: Payer: Self-pay | Admitting: Internal Medicine

## 2014-05-21 LAB — MDC_IDC_ENUM_SESS_TYPE_INCLINIC
Date Time Interrogation Session: 20150615040000
Implantable Pulse Generator Serial Number: 111255
Lead Channel Impedance Value: 486 Ohm
Lead Channel Impedance Value: 588 Ohm
Lead Channel Pacing Threshold Amplitude: 0.7 V
Lead Channel Pacing Threshold Pulse Width: 0.4 ms
Lead Channel Pacing Threshold Pulse Width: 0.4 ms
Lead Channel Sensing Intrinsic Amplitude: 25 mV
Lead Channel Sensing Intrinsic Amplitude: 3.8 mV
Lead Channel Setting Pacing Amplitude: 2.4 V
Lead Channel Setting Sensing Sensitivity: 2.5 mV
MDC IDC MSMT LEADCHNL RV PACING THRESHOLD AMPLITUDE: 1.4 V
MDC IDC SET LEADCHNL RA PACING AMPLITUDE: 2 V
MDC IDC SET LEADCHNL RV PACING PULSEWIDTH: 0.4 ms
MDC IDC SET ZONE DETECTION INTERVAL: 375 ms
MDC IDC STAT BRADY RA PERCENT PACED: 62 %
MDC IDC STAT BRADY RV PERCENT PACED: 1 %

## 2014-07-03 ENCOUNTER — Ambulatory Visit (INDEPENDENT_AMBULATORY_CARE_PROVIDER_SITE_OTHER): Payer: Medicare Other | Admitting: *Deleted

## 2014-07-03 ENCOUNTER — Encounter: Payer: Self-pay | Admitting: Internal Medicine

## 2014-07-03 ENCOUNTER — Ambulatory Visit (INDEPENDENT_AMBULATORY_CARE_PROVIDER_SITE_OTHER): Payer: Medicare Other | Admitting: Internal Medicine

## 2014-07-03 VITALS — BP 140/70 | HR 60 | Temp 97.8°F | Ht 71.0 in | Wt 197.8 lb

## 2014-07-03 DIAGNOSIS — N320 Bladder-neck obstruction: Secondary | ICD-10-CM

## 2014-07-03 DIAGNOSIS — E785 Hyperlipidemia, unspecified: Secondary | ICD-10-CM

## 2014-07-03 DIAGNOSIS — Z0000 Encounter for general adult medical examination without abnormal findings: Secondary | ICD-10-CM

## 2014-07-03 DIAGNOSIS — K2270 Barrett's esophagus without dysplasia: Secondary | ICD-10-CM

## 2014-07-03 DIAGNOSIS — I2510 Atherosclerotic heart disease of native coronary artery without angina pectoris: Secondary | ICD-10-CM | POA: Diagnosis not present

## 2014-07-03 DIAGNOSIS — B356 Tinea cruris: Secondary | ICD-10-CM | POA: Insufficient documentation

## 2014-07-03 DIAGNOSIS — I4891 Unspecified atrial fibrillation: Secondary | ICD-10-CM

## 2014-07-03 DIAGNOSIS — Z7902 Long term (current) use of antithrombotics/antiplatelets: Secondary | ICD-10-CM | POA: Insufficient documentation

## 2014-07-03 DIAGNOSIS — R7989 Other specified abnormal findings of blood chemistry: Secondary | ICD-10-CM

## 2014-07-03 DIAGNOSIS — I482 Chronic atrial fibrillation, unspecified: Secondary | ICD-10-CM

## 2014-07-03 DIAGNOSIS — R739 Hyperglycemia, unspecified: Secondary | ICD-10-CM

## 2014-07-03 LAB — BASIC METABOLIC PANEL
BUN: 20 mg/dL (ref 6–23)
CO2: 28 mEq/L (ref 19–32)
CREATININE: 1 mg/dL (ref 0.4–1.5)
Calcium: 9.4 mg/dL (ref 8.4–10.5)
Chloride: 106 mEq/L (ref 96–112)
GFR: 84.18 mL/min (ref >60.00)
GLUCOSE: 106 mg/dL — AB (ref 70–99)
POTASSIUM: 4.3 meq/L (ref 3.5–5.1)
Sodium: 140 mEq/L (ref 135–145)

## 2014-07-03 LAB — CBC WITH DIFFERENTIAL/PLATELET
BASOS ABS: 0 10*3/uL (ref 0.0–0.1)
Basophils Relative: 0.3 % (ref 0.0–3.0)
Eosinophils Absolute: 0.1 10*3/uL (ref 0.0–0.7)
Eosinophils Relative: 1.4 % (ref 0.0–5.0)
HEMATOCRIT: 45.1 % (ref 39.0–52.0)
Hemoglobin: 15.4 g/dL (ref 13.0–17.0)
Lymphocytes Relative: 25.7 % (ref 12.0–46.0)
Lymphs Abs: 1.4 10*3/uL (ref 0.7–4.0)
MCHC: 34.1 g/dL (ref 30.0–36.0)
MCV: 88.7 fl (ref 78.0–100.0)
MONO ABS: 0.9 10*3/uL (ref 0.1–1.0)
Monocytes Relative: 15.4 % — ABNORMAL HIGH (ref 3.0–12.0)
NEUTROS PCT: 57.2 % (ref 43.0–77.0)
Neutro Abs: 3.2 10*3/uL (ref 1.4–7.7)
PLATELETS: 172 10*3/uL (ref 150.0–400.0)
RBC: 5.09 Mil/uL (ref 4.22–5.81)
RDW: 13.7 % (ref 11.5–15.5)
WBC: 5.6 10*3/uL (ref 4.0–10.5)

## 2014-07-03 LAB — URINALYSIS
Bilirubin Urine: NEGATIVE
Hgb urine dipstick: NEGATIVE
KETONES UR: NEGATIVE
LEUKOCYTES UA: NEGATIVE
Nitrite: NEGATIVE
PH: 6 (ref 5.0–8.0)
SPECIFIC GRAVITY, URINE: 1.02 (ref 1.000–1.030)
TOTAL PROTEIN, URINE-UPE24: NEGATIVE
URINE GLUCOSE: NEGATIVE
Urobilinogen, UA: 0.2 (ref 0.0–1.0)

## 2014-07-03 LAB — LIPID PANEL
CHOL/HDL RATIO: 4
CHOLESTEROL: 120 mg/dL (ref 0–200)
HDL: 31.5 mg/dL — ABNORMAL LOW (ref >39.00)
LDL CALC: 83 mg/dL (ref 0–99)
NonHDL: 88.5
TRIGLYCERIDES: 29 mg/dL (ref 0.0–149.0)
VLDL: 5.8 mg/dL (ref 0.0–40.0)

## 2014-07-03 LAB — HEPATIC FUNCTION PANEL
ALT: 34 U/L (ref 0–53)
AST: 35 U/L (ref 0–37)
Albumin: 4.1 g/dL (ref 3.5–5.2)
Alkaline Phosphatase: 84 U/L (ref 39–117)
BILIRUBIN TOTAL: 2.3 mg/dL — AB (ref 0.2–1.2)
Bilirubin, Direct: 0.3 mg/dL (ref 0.0–0.3)
Total Protein: 6.8 g/dL (ref 6.0–8.3)

## 2014-07-03 LAB — PSA: PSA: 1.22 ng/mL (ref 0.10–4.00)

## 2014-07-03 LAB — TSH: TSH: 5.04 u[IU]/mL — AB (ref 0.35–4.50)

## 2014-07-03 MED ORDER — VITAMIN D 1000 UNITS PO TABS: 1000.0000 [IU] | ORAL_TABLET | Freq: Every day | ORAL | Status: DC

## 2014-07-03 MED ORDER — KETOCONAZOLE 200 MG PO TABS: 200.0000 mg | ORAL_TABLET | Freq: Every day | ORAL | Status: DC

## 2014-07-03 NOTE — Progress Notes (Signed)
Pre visit review using our clinic review tool, if applicable. No additional management support is needed unless otherwise documented below in the visit note. 

## 2014-07-03 NOTE — Assessment & Plan Note (Signed)
Ketoconazole po Tenactin Nylon underwear

## 2014-07-03 NOTE — Patient Instructions (Signed)
Take 10 mg Lipitor while on KetoconazoleHealth Maintenance A healthy lifestyle and preventative care can promote health and wellness.  Maintain regular health, dental, and eye exams.  Eat a healthy diet. Foods like vegetables, fruits, whole grains, low-fat dairy products, and lean protein foods contain the nutrients you need and are low in calories. Decrease your intake of foods high in solid fats, added sugars, and salt. Get information about a proper diet from your health care provider, if necessary.  Regular physical exercise is one of the most important things you can do for your health. Most adults should get at least 150 minutes of moderate-intensity exercise (any activity that increases your heart rate and causes you to sweat) each week. In addition, most adults need muscle-strengthening exercises on 2 or more days a week.   Maintain a healthy weight. The body mass index (BMI) is a screening tool to identify possible weight problems. It provides an estimate of body fat based on height and weight. Your health care provider can find your BMI and can help you achieve or maintain a healthy weight. For males 20 years and older:  A BMI below 18.5 is considered underweight.  A BMI of 18.5 to 24.9 is normal.  A BMI of 25 to 29.9 is considered overweight.  A BMI of 30 and above is considered obese.  Maintain normal blood lipids and cholesterol by exercising and minimizing your intake of saturated fat. Eat a balanced diet with plenty of fruits and vegetables. Blood tests for lipids and cholesterol should begin at age 40 and be repeated every 5 years. If your lipid or cholesterol levels are high, you are over age 38, or you are at high risk for heart disease, you may need your cholesterol levels checked more frequently.Ongoing high lipid and cholesterol levels should be treated with medicines if diet and exercise are not working.  If you smoke, find out from your health care provider how to quit.  If you do not use tobacco, do not start.  Lung cancer screening is recommended for adults aged 41-80 years who are at high risk for developing lung cancer because of a history of smoking. A yearly low-dose CT scan of the lungs is recommended for people who have at least a 30-pack-year history of smoking and are current smokers or have quit within the past 15 years. A pack year of smoking is smoking an average of 1 pack of cigarettes a day for 1 year (for example, a 30-pack-year history of smoking could mean smoking 1 pack a day for 30 years or 2 packs a day for 15 years). Yearly screening should continue until the smoker has stopped smoking for at least 15 years. Yearly screening should be stopped for people who develop a health problem that would prevent them from having lung cancer treatment.  If you choose to drink alcohol, do not have more than 2 drinks per day. One drink is considered to be 12 oz (360 mL) of beer, 5 oz (150 mL) of wine, or 1.5 oz (45 mL) of liquor.  Avoid the use of street drugs. Do not share needles with anyone. Ask for help if you need support or instructions about stopping the use of drugs.  High blood pressure causes heart disease and increases the risk of stroke. Blood pressure should be checked at least every 1-2 years. Ongoing high blood pressure should be treated with medicines if weight loss and exercise are not effective.  If you are 29-47 years old,  ask your health care provider if you should take aspirin to prevent heart disease.  Diabetes screening involves taking a blood sample to check your fasting blood sugar level. This should be done once every 3 years after age 45 if you are at a normal weight and without risk factors for diabetes. Testing should be considered at a younger age or be carried out more frequently if you are overweight and have at least 1 risk factor for diabetes.  Colorectal cancer can be detected and often prevented. Most routine colorectal cancer  screening begins at the age of 50 and continues through age 75. However, your health care provider may recommend screening at an earlier age if you have risk factors for colon cancer. On a yearly basis, your health care provider may provide home test kits to check for hidden blood in the stool. A small camera at the end of a tube may be used to directly examine the colon (sigmoidoscopy or colonoscopy) to detect the earliest forms of colorectal cancer. Talk to your health care provider about this at age 50 when routine screening begins. A direct exam of the colon should be repeated every 5-10 years through age 75, unless early forms of precancerous polyps or small growths are found.  People who are at an increased risk for hepatitis B should be screened for this virus. You are considered at high risk for hepatitis B if:  You were born in a country where hepatitis B occurs often. Talk with your health care provider about which countries are considered high risk.  Your parents were born in a high-risk country and you have not received a shot to protect against hepatitis B (hepatitis B vaccine).  You have HIV or AIDS.  You use needles to inject street drugs.  You live with, or have sex with, someone who has hepatitis B.  You are a man who has sex with other men (MSM).  You get hemodialysis treatment.  You take certain medicines for conditions like cancer, organ transplantation, and autoimmune conditions.  Hepatitis C blood testing is recommended for all people born from 1945 through 1965 and any individual with known risk factors for hepatitis C.  Healthy men should no longer receive prostate-specific antigen (PSA) blood tests as part of routine cancer screening. Talk to your health care provider about prostate cancer screening.  Testicular cancer screening is not recommended for adolescents or adult males who have no symptoms. Screening includes self-exam, a health care provider exam, and other  screening tests. Consult with your health care provider about any symptoms you have or any concerns you have about testicular cancer.  Practice safe sex. Use condoms and avoid high-risk sexual practices to reduce the spread of sexually transmitted infections (STIs).  You should be screened for STIs, including gonorrhea and chlamydia if:  You are sexually active and are younger than 24 years.  You are older than 24 years, and your health care provider tells you that you are at risk for this type of infection.  Your sexual activity has changed since you were last screened, and you are at an increased risk for chlamydia or gonorrhea. Ask your health care provider if you are at risk.  If you are at risk of being infected with HIV, it is recommended that you take a prescription medicine daily to prevent HIV infection. This is called pre-exposure prophylaxis (PrEP). You are considered at risk if:  You are a man who has sex with other men (MSM).    You are a heterosexual man who is sexually active with multiple partners.  You take drugs by injection.  You are sexually active with a partner who has HIV.  Talk with your health care provider about whether you are at high risk of being infected with HIV. If you choose to begin PrEP, you should first be tested for HIV. You should then be tested every 3 months for as long as you are taking PrEP.  Use sunscreen. Apply sunscreen liberally and repeatedly throughout the day. You should seek shade when your shadow is shorter than you. Protect yourself by wearing long sleeves, pants, a wide-brimmed hat, and sunglasses year round whenever you are outdoors.  Tell your health care provider of new moles or changes in moles, especially if there is a change in shape or color. Also, tell your health care provider if a mole is larger than the size of a pencil eraser.  A one-time screening for abdominal aortic aneurysm (AAA) and surgical repair of large AAAs by  ultrasound is recommended for men aged 19-75 years who are current or former smokers.  Stay current with your vaccines (immunizations). Document Released: 05/14/2008 Document Revised: 11/21/2013 Document Reviewed: 04/13/2011 [facility] Patient Information 2015 [place], [place]. This information is not intended to replace advice given to you by your health care provider. Make sure you discuss any questions you have with your health care provider.

## 2014-07-03 NOTE — Progress Notes (Signed)
   Subjective:    HPI The patient is here for a wellness exam. The patient has been doing well overall without major physical or psychological issues going on lately.  The patient needs to address  chronic hypertension that has been well controlled with medicines; to address chronic  hyperlipidemia controlled with medicines as well; and to address CAD, controlled with medical treatment and diet.  C/o jock itch this summer - not better, on Tenactin  Colon q 5 years    Review of Systems     Objective:   Physical Exam        Assessment & Plan:

## 2014-07-05 NOTE — Assessment & Plan Note (Signed)

## 2014-07-05 NOTE — Assessment & Plan Note (Signed)
Continue with current prescription therapy as reflected on the Med list.  

## 2014-07-24 DIAGNOSIS — L820 Inflamed seborrheic keratosis: Secondary | ICD-10-CM | POA: Diagnosis not present

## 2014-07-24 DIAGNOSIS — L821 Other seborrheic keratosis: Secondary | ICD-10-CM | POA: Diagnosis not present

## 2014-07-24 DIAGNOSIS — D239 Other benign neoplasm of skin, unspecified: Secondary | ICD-10-CM | POA: Diagnosis not present

## 2014-07-24 DIAGNOSIS — L408 Other psoriasis: Secondary | ICD-10-CM | POA: Diagnosis not present

## 2014-08-03 ENCOUNTER — Telehealth: Payer: Self-pay | Admitting: *Deleted

## 2014-08-03 NOTE — Telephone Encounter (Signed)
LMOVM to assist pt to set up his Latitude communicator (pt has remote appt 08/13/14). I left my direct # but also stated anyone in the device clinic can assist him if I am not available.

## 2014-08-08 ENCOUNTER — Telehealth: Payer: Self-pay | Admitting: Cardiology

## 2014-08-08 NOTE — Telephone Encounter (Signed)
Pt called to receive help with setting up monitor. Phone call was disconnected. Attempted to call pt back x3. His wife gave me 2 additional numbers. I tried both numbers left a message on one and no answer on the other.

## 2014-08-13 ENCOUNTER — Encounter: Payer: Medicare Other | Admitting: *Deleted

## 2014-08-13 ENCOUNTER — Ambulatory Visit (INDEPENDENT_AMBULATORY_CARE_PROVIDER_SITE_OTHER): Payer: Medicare Other | Admitting: *Deleted

## 2014-08-13 DIAGNOSIS — I4891 Unspecified atrial fibrillation: Secondary | ICD-10-CM | POA: Diagnosis not present

## 2014-08-13 DIAGNOSIS — I480 Paroxysmal atrial fibrillation: Secondary | ICD-10-CM

## 2014-08-15 LAB — MDC_IDC_ENUM_SESS_TYPE_INCLINIC
Battery Remaining Longevity: 12.5
Implantable Pulse Generator Serial Number: 111255
Lead Channel Impedance Value: 489 Ohm
Lead Channel Impedance Value: 595 Ohm
Lead Channel Pacing Threshold Amplitude: 1.1 V
Lead Channel Pacing Threshold Pulse Width: 0.7 ms
Lead Channel Sensing Intrinsic Amplitude: 25 mV
Lead Channel Setting Pacing Amplitude: 2 V
Lead Channel Setting Pacing Amplitude: 2.4 V
Lead Channel Setting Pacing Pulse Width: 0.4 ms
Lead Channel Setting Sensing Sensitivity: 2.5 mV
MDC IDC MSMT LEADCHNL RA PACING THRESHOLD AMPLITUDE: 0.7 V
MDC IDC MSMT LEADCHNL RA PACING THRESHOLD PULSEWIDTH: 0.4 ms
MDC IDC MSMT LEADCHNL RA SENSING INTR AMPL: 3.3 mV
MDC IDC SET ZONE DETECTION INTERVAL: 375 ms
MDC IDC STAT BRADY RA PERCENT PACED: 89 %
MDC IDC STAT BRADY RV PERCENT PACED: 2 %

## 2014-08-15 NOTE — Progress Notes (Signed)
Pacemaker check in clinic. Normal device function. Thresholds, sensing, impedances consistent with previous measurements. Device programmed to maximize longevity. No mode switch episodes. 1 high ventricular rate noted x 7 sec @ 174bpm--AT. Device programmed at appropriate safety margins. Histogram distribution appropriate for patient activity level. Device programmed to optimize intrinsic conduction. Estimated longevity 12.5 years. Patient will follow up via Latitude NXT on 12-14 and with SK in 05-2015.

## 2014-08-31 ENCOUNTER — Telehealth: Payer: Self-pay | Admitting: *Deleted

## 2014-08-31 NOTE — Telephone Encounter (Signed)
Spoke w/ pt's spouse. Left my direct #. Stated I need to instruct pt how to set up his pacemaker monitor. Spouse stated pt is "very busy" and currently at work.

## 2014-09-03 ENCOUNTER — Encounter: Payer: Self-pay | Admitting: Internal Medicine

## 2014-09-04 ENCOUNTER — Encounter: Payer: Self-pay | Admitting: Cardiology

## 2014-09-04 ENCOUNTER — Other Ambulatory Visit (INDEPENDENT_AMBULATORY_CARE_PROVIDER_SITE_OTHER): Payer: Medicare Other

## 2014-09-04 DIAGNOSIS — R7989 Other specified abnormal findings of blood chemistry: Secondary | ICD-10-CM

## 2014-09-04 DIAGNOSIS — R739 Hyperglycemia, unspecified: Secondary | ICD-10-CM | POA: Diagnosis not present

## 2014-09-04 LAB — HEMOGLOBIN A1C: HEMOGLOBIN A1C: 6 % (ref 4.6–6.5)

## 2014-09-04 LAB — TSH: TSH: 4.13 u[IU]/mL (ref 0.35–4.50)

## 2014-09-04 LAB — T4, FREE: Free T4: 1.04 ng/dL (ref 0.60–1.60)

## 2014-09-07 ENCOUNTER — Ambulatory Visit (INDEPENDENT_AMBULATORY_CARE_PROVIDER_SITE_OTHER): Payer: Medicare Other | Admitting: Cardiology

## 2014-09-07 ENCOUNTER — Encounter: Payer: Self-pay | Admitting: Cardiology

## 2014-09-07 VITALS — BP 110/68 | HR 63 | Ht 71.0 in | Wt 201.8 lb

## 2014-09-07 DIAGNOSIS — I480 Paroxysmal atrial fibrillation: Secondary | ICD-10-CM

## 2014-09-07 DIAGNOSIS — I2510 Atherosclerotic heart disease of native coronary artery without angina pectoris: Secondary | ICD-10-CM

## 2014-09-07 DIAGNOSIS — R0989 Other specified symptoms and signs involving the circulatory and respiratory systems: Secondary | ICD-10-CM

## 2014-09-07 DIAGNOSIS — E785 Hyperlipidemia, unspecified: Secondary | ICD-10-CM

## 2014-09-07 DIAGNOSIS — I779 Disorder of arteries and arterioles, unspecified: Secondary | ICD-10-CM

## 2014-09-07 DIAGNOSIS — I370 Nonrheumatic pulmonary valve stenosis: Secondary | ICD-10-CM

## 2014-09-07 DIAGNOSIS — I739 Peripheral vascular disease, unspecified: Secondary | ICD-10-CM

## 2014-09-07 DIAGNOSIS — R17 Unspecified jaundice: Secondary | ICD-10-CM

## 2014-09-07 DIAGNOSIS — I351 Nonrheumatic aortic (valve) insufficiency: Secondary | ICD-10-CM | POA: Diagnosis not present

## 2014-09-07 DIAGNOSIS — IMO0002 Reserved for concepts with insufficient information to code with codable children: Secondary | ICD-10-CM

## 2014-09-07 DIAGNOSIS — Z79899 Other long term (current) drug therapy: Secondary | ICD-10-CM

## 2014-09-07 DIAGNOSIS — Z950 Presence of cardiac pacemaker: Secondary | ICD-10-CM

## 2014-09-07 DIAGNOSIS — I10 Essential (primary) hypertension: Secondary | ICD-10-CM

## 2014-09-07 DIAGNOSIS — E876 Hypokalemia: Secondary | ICD-10-CM

## 2014-09-07 DIAGNOSIS — R9430 Abnormal result of cardiovascular function study, unspecified: Secondary | ICD-10-CM

## 2014-09-07 DIAGNOSIS — Z7901 Long term (current) use of anticoagulants: Secondary | ICD-10-CM

## 2014-09-07 NOTE — Assessment & Plan Note (Addendum)
Her disease is stable. He is not having any significant symptoms. He had a nuclear scan in November, 2013 with no ischemia. The study was not gated.  As part of today's evaluation we had an extensive discussion about multiple issues. I spent greater than 25 minutes with his total care. More than half of this time was with direct contact with him.

## 2014-09-07 NOTE — Assessment & Plan Note (Signed)
His EF was 55% by a limited echo done after his atrial fibrillation in 2014. This will be reassessed with his echo in 6 months.

## 2014-09-07 NOTE — Assessment & Plan Note (Signed)
Previously he had been anticoagulated for his atrial fibrillation. Eventually this was stopped after his ablation.

## 2014-09-07 NOTE — Assessment & Plan Note (Signed)
When we push his Lipitor up to 40 mg daily he did not feel well. He cut it back to 20 and he is stable with this. His LDL is 83 with this. I carefully discussed with him the pros and cons of adding Zetia. I feel it is not the best decision for him at this time. We also talked about PCSK9 medications that are now available. He does not meet strict criteria for using one of these. We both agree that it is most prudent to continue to follow for now.

## 2014-09-07 NOTE — Progress Notes (Signed)
Patient ID: Russell Griffith, male   DOB: 02-Aug-1946, 68 y.o.   MRN: [id number]    HPI  Patient returns for followup of overall cardiac status. He has known coronary disease post CABG in the past. 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 He had atrial fibrillation and is post ablation. There is mild valvular disease is being followed over time. Historically he does not feel well if we put his medications to higher levels. Most recently he has been able to tolerate Lipitor at 20 mg daily. He felt poorly when we tried to push his dose up to 40 mg. He is able to tolerate 20 mg. His LDL is 83 with this.  He exercises regularly. He has noted that with significant exercise his peak heart rate is only in the range of 105. He probably has some chronotropic incompetence. He will discuss this with Dr. Caryl Comes to see if any adjustments in his pacemaker might help. However, he does not have any significant symptoms related to this. He has normal sinus rhythm. He paces his ventricle.  Allergies  Allergen Reactions  . Sulfonamide Derivatives     "have no idea; mother told me I was allergic to"    Current Outpatient Prescriptions  Medication Sig Dispense Refill  . aspirin 81 MG tablet Take 81 mg by mouth daily.      Marland Speights atorvastatin (LIPITOR) 20 MG tablet Take 20 mg by mouth daily.      . Calcium & Magnesium Carbonates (MYLANTA PO) Take 10 mg by mouth daily as needed.       . cholecalciferol (VITAMIN D) 1000 UNITS tablet Take 1 tablet (1,000 Units total) by mouth daily.  100 tablet  3  . esomeprazole (NEXIUM) 20 MG capsule Take 20 mg by mouth daily at 12 noon.      . Multiple Vitamin (MULTIVITAMIN WITH MINERALS) TABS Take 1 tablet by mouth daily.      . ramipril (ALTACE) 2.5 MG capsule TAKE (1) CAPSULE DAILY.  30 capsule  5   No current facility-administered medications for this visit.    History   Social History  . Marital Status: Married    Spouse Name: N/A    Number of Children: 2  . Years of  Education: 18   Occupational History  . [employer]   Social History Main Topics  . Smoking status: Never Smoker   . Smokeless tobacco: Never Used  . Alcohol Use: 12.6 oz/week    21 Glasses of wine per week  . Drug Use: No  . Sexual Activity: Yes    Partners: Female   Other Topics Concern  . Not on file   Social History Narrative   [place], [place].  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  2 daughters-'75, '79; 1 grandchild; step-daughter and step grandson.  Regular exercise-yes, runs 1.5-2 mi 4x/wk, also eliptical      Patient signed a Designated Party Release to allow his wife, Shooter Tangen, to have access to his medical records/ information.    Family History  Problem Relation Age of Onset  . Hyperlipidemia    . Hypertension    . Coronary artery disease Other 70  . Diabetes Other   . Colon cancer Neg Hx   . Multiple myeloma Mother   . Diabetes Father   . Renal Disease Father     Past Medical History  Diagnosis Date  . CAD (  coronary artery disease)     Myoview March, 2011, excellent exercise, hypertensive response, no scar or ischemia, EF 61%, mild palpitations peak stress with infrequent PACs and PVCs.  Marland Ferdig Hyperlipidemia     Low HDL  . GERD (gastroesophageal reflux disease)     Barrett's esophagus  . Colonic polyp   . Palpitations      exercise palpitations, probably A. fib-not VT,Pindolol not tolerated, CAD, bradycardia,Tickosyn needs hospital +/_ side effects, amiodarone & sotalol could need pacer,Multaq less effective not tried, atrial fib ablation could be considered at low risk  . Foot pain     Chronic lateral foot pain-resolved with use of orthotics '09  . Aortic stenosis     Mild, echo, April, 2012  . Carotid artery disease     Doppler November, 2010, 0-39% bilateral followup 2 years  . Warfarin anticoagulation     Coumadin for atrial fib  . Aortic insufficiency        Mild, echo, 2010 / mild/moderate, echo, 2012  . Elevated bilirubin     Mild chronic elevation, 2.0 January, 2011 stable  . BPH (benign prostatic hyperplasia)   . Drug intolerance     Pindolol before exercise not tolerated  . Ejection fraction     EF 60-65%, echo, September, 2010 / EF 60-65% echo, April, 2012  . Hypokalemia     Potassium 3.6 before potassium started April, 2012  . Hx of colonoscopy   . Chronic foot pain 2009    Resolved with use of orthotics  . Intolerance of drug     Fish oil, bilirubin, nacin  . BPH (benign prostatic hyperplasia)   . Pulmonic stenosis     Mild, echo, April, 2012  . TSH (thyroid-stimulating hormone deficiency)   . Hemorrhoids     September, 2012  . Numbness of toes     Slight numbness of one toe, September, 2012  . Lung granuloma     Left  lung chest x-ray July, 2013  . A-fib      Admission for Tikosyn load, March 20, 2011, converted after first dose, QTC 440 at the time of discharge. / Office note March 27, 2011, clinically in and out of atrial, QTC 500 ms, Dr.Klein decreased Tikosyn to 361m BID.  Atrial fib at rest February 25, 2011 / [facility]  . PVC's (premature ventricular contractions)   . Atrial flutter     Status post ablation  . HTN (hypertension)     Controlled at rest, hypertension on treadmill, sensitive to medications  . Shortness of breath 07/15/2012    "can happen at any time; related to AF and bradycardia"  . Barrett's esophagus   . Migraines 07/15/2012    "years ago; before glasses changed"  . Primary osteoarthritis of left knee     Mild  . Mitral regurgitation   . Tricuspid regurgitation   . HX: anticoagulation     Past Surgical History  Procedure Laterality Date  . Insert / replace / remove pacemaker  07/15/2012    initial placement  . Tonsillectomy  1956  . Coronary artery bypass graft  2000    CABG X5  . Radiofrequency  ablation for heart arrythmia      Patient Active Problem List    Diagnosis Date Noted  . CAD (coronary artery disease)     Priority: High  . Hx of CABG     Priority: High  . Drug therapy     Priority: High  .  Drug intolerance     Priority: High  . Oral anticoagulation     Priority: High  . Hypokalemia     Priority: High  . A-fib     Priority: High  . Well adult exam 07/03/2014  . Jock itch 07/03/2014  . Dyslipidemia 03/06/2014  . Ejection fraction   . Diverticulitis of colon without hemorrhage 08/31/2013  . Mitral regurgitation   . Tricuspid regurgitation   . HX: anticoagulation   . Abdominal distention 01/23/2013  . Diffuse anterior T-wave inversions 10/24/2012  . PPM-[employer] 07/16/2012  . 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 Lung granuloma   . Shortness of breath   . Nausea   . Aortic stenosis   . Routine health maintenance 04/11/2012  . Plantar fasciitis of right foot 04/04/2012  . Tumescence 02/23/2012  . Numbness of toes   . HTN (hypertension)   . Hyperlipidemia   . Pulmonic stenosis   . Aortic insufficiency   . QT prolongation on Tikosyn 03/26/2011  . PVC's (premature ventricular contractions)   . Carotid artery disease   . Elevated bilirubin   . THYROID STIMULATING HORMONE, ABNORMAL 04/01/2010  . DERANGEMENT OF MENISCUS NOT ELSEWHERE CLASSIFIED 12/03/2009  . Knee pain 12/03/2009  . BENIGN PROSTATIC HYPERTROPHY, WITH OBSTRUCTION 09/30/2009  . Unspecified disorders of bursae and tendons in shoulder region 08/22/2009  . 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 ADENOMATOUS COLONIC POLYP 03/06/2008  . ANXIETY DEPRESSION 03/06/2008  . ESOPHAGITIS 03/06/2008  . ESOPHAGEAL STENOSIS 03/06/2008  . BARRETTS ESOPHAGUS 03/06/2008  . GASTRITIS, CHRONIC 03/06/2008  . DUODENITIS 03/06/2008  . DIVERTICULOSIS, COLON 03/06/2008  . PROSTATITIS, HX OF 03/06/2008  . OSTEOARTHRITIS, KNEE, LEFT 11/04/2007  . FOOT PAIN, LEFT 11/04/2007  . GERD 08/09/2007  . COLONIC  POLYPS, HX OF 08/09/2007    ROS   Patient denies fever, chills, headache, sweats, rash, change in vision, change in hearing, chest pain, cough, nausea vomiting, urinary symptoms. All other systems are reviewed and are negative.  PHYSICAL EXAM   He looks quite good. He is oriented to person time and place. Affect is normal. Head is atraumatic. Sclera and conjunctiva are normal. There is no jugular venous distention. Lungs are clear. Respiratory effort is nonlabored. Cardiac exam reveals S1 and S2. The rhythm is regular. Abdomen is soft. There's no peripheral edema. There no musculoskeletal deformities. There are no skin rashes.  There were no vitals filed for this visit.  EKG  ASSESSMENT & PLAN

## 2014-09-07 NOTE — Assessment & Plan Note (Signed)
He has mild disease of the aortic valve, mitral valve, pulmonic valve, and tricuspid valve. When I see him in 6 months we will arrange for an echo to be done 2 weeks before that visit. This will be done to reassess his LV and RV and all 4 of his valves.

## 2014-09-07 NOTE — Assessment & Plan Note (Signed)
His atrial fibrillation has been ablated. He's doing very well. His anticoagulation has been stopped. He is back on aspirin for his coronary disease.

## 2014-09-07 NOTE — Assessment & Plan Note (Signed)
There was question of mild pulmonic stenosis by echo in 2012. This has not been addressed over time and recent echoes. It will be addressed at the time of his next echo in 6 months.

## 2014-09-07 NOTE — Assessment & Plan Note (Signed)
His pacemaker is in place and working well. It is followed by the electrophysiology team.

## 2014-09-07 NOTE — Assessment & Plan Note (Signed)
For a period of time he had decreased potassium. This has now resolved.

## 2014-09-07 NOTE — Assessment & Plan Note (Signed)
Blood pressure is controlled. No change in therapy. 

## 2014-09-07 NOTE — Patient Instructions (Signed)
Your physician recommends that you continue on your current medications as directed. Please refer to the Current Medication list given to you today.  Your physician has requested that you have an echocardiogram. Echocardiography is a painless test that uses sound waves to create images of your heart. It provides your doctor with information about the size and shape of your heart and how well your heart's chambers and valves are working. This procedure takes approximately one hour. There are no restrictions for this procedure. To be done 1 week prior to f/u with Dr Ron Parker in April 2016.  Your physician wants you to follow-up in: 6 months. You will receive a reminder letter in the mail two months in advance. If you don't receive a letter, please call our office to schedule the follow-up appointment.

## 2014-09-07 NOTE — Assessment & Plan Note (Signed)
He has chronic mild elevation of his bilirubin in the range of 2.0 - 2.3. This is old and probably not a significant issue.

## 2014-09-07 NOTE — Assessment & Plan Note (Signed)
His carotids are being followed carefully. No change in therapy.

## 2014-09-07 NOTE — Assessment & Plan Note (Signed)
He is intermittently had slight elevation in TSH over time. Most recent check showed that the TSH was high normal and the T4 was normal. No further workup.

## 2014-09-14 ENCOUNTER — Other Ambulatory Visit: Payer: Self-pay

## 2014-09-26 ENCOUNTER — Other Ambulatory Visit: Payer: Self-pay | Admitting: *Deleted

## 2014-09-26 MED ORDER — ATORVASTATIN CALCIUM 20 MG PO TABS: 20.0000 mg | ORAL_TABLET | Freq: Every day | ORAL | Status: DC

## 2014-11-08 ENCOUNTER — Encounter (HOSPITAL_COMMUNITY): Payer: Self-pay | Admitting: Internal Medicine

## 2014-11-12 ENCOUNTER — Encounter: Payer: Self-pay | Admitting: Internal Medicine

## 2014-11-12 ENCOUNTER — Ambulatory Visit (INDEPENDENT_AMBULATORY_CARE_PROVIDER_SITE_OTHER): Payer: Medicare Other | Admitting: *Deleted

## 2014-11-12 DIAGNOSIS — I495 Sick sinus syndrome: Secondary | ICD-10-CM

## 2014-11-12 LAB — MDC_IDC_ENUM_SESS_TYPE_REMOTE
Brady Statistic RV Percent Paced: 3 %
Date Time Interrogation Session: 20151214063000
Implantable Pulse Generator Serial Number: 111255
Lead Channel Impedance Value: 565 Ohm
Lead Channel Pacing Threshold Amplitude: 0.7 V
Lead Channel Pacing Threshold Pulse Width: 0.4 ms
Lead Channel Sensing Intrinsic Amplitude: 1.9 mV
Lead Channel Sensing Intrinsic Amplitude: 25 mV
Lead Channel Setting Pacing Amplitude: 2.4 V
MDC IDC MSMT BATTERY REMAINING LONGEVITY: 144 mo
MDC IDC MSMT BATTERY REMAINING PERCENTAGE: 100 %
MDC IDC MSMT LEADCHNL RV IMPEDANCE VALUE: 473 Ohm
MDC IDC SET LEADCHNL RA PACING AMPLITUDE: 2 V
MDC IDC SET LEADCHNL RV PACING PULSEWIDTH: 0.7 ms
MDC IDC SET LEADCHNL RV SENSING SENSITIVITY: 2.5 mV
MDC IDC STAT BRADY RA PERCENT PACED: 81 %
Zone Setting Detection Interval: 375 ms

## 2014-11-12 NOTE — Progress Notes (Signed)
Remote pacemaker transmission.   

## 2014-12-03 ENCOUNTER — Encounter: Payer: Self-pay | Admitting: Sports Medicine

## 2014-12-03 ENCOUNTER — Ambulatory Visit (INDEPENDENT_AMBULATORY_CARE_PROVIDER_SITE_OTHER): Payer: PPO | Admitting: Sports Medicine

## 2014-12-03 ENCOUNTER — Other Ambulatory Visit (HOSPITAL_COMMUNITY): Payer: Self-pay | Admitting: Diagnostic Radiology

## 2014-12-03 ENCOUNTER — Ambulatory Visit
Admission: RE | Admit: 2014-12-03 | Discharge: 2014-12-03 | Disposition: A | Discharge status: Home / Self Care | Payer: PPO | Source: Ambulatory Visit | Attending: Sports Medicine | Admitting: Sports Medicine

## 2014-12-03 VITALS — BP 145/80 | HR 56 | Ht 70.0 in | Wt 195.0 lb

## 2014-12-03 DIAGNOSIS — M25511 Pain in right shoulder: Secondary | ICD-10-CM

## 2014-12-03 NOTE — Progress Notes (Signed)
   Subjective:    Patient ID: Russell Griffith, male    DOB: [birth year], 69 y.o.   MRN: [id number]  HPI chief complaint: Right shoulder pain  [name] is a 69 year old right-hand-dominant male that comes in today complaining of 6 months of worsening diffuse right shoulder pain. Pain began after he was doing some pushups. He has stopped lifting weights over the past 3-4 weeks but his pain persists. It is primarily present with abduction and overhead reaching. He has also noticed some weakness. Occasional nighttime pain as well. He denies any catching or popping. No numbness or tingling. No prior shoulder surgeries. No recent or remote trauma. He's not tried any over-the-counter pain medications.  Past medical history reviewed. Medical history is significant for coronary artery disease and atrial fibrillation. He is status post pacemaker placement. Medications reviewed Allergies reviewed    Review of Systems     Objective:   Physical Exam Well-developed, well-nourished. No acute distress. Awake alert and oriented 3. Vital signs reviewed.  Right shoulder: Active and passive forward flexion is to about 180. Active abduction is about 150 degrees. Mildly positive painful arc. No tenderness to palpation along the clavicle nor over the ac joint. Rotator cuff strength is 5/5 but slightly reproducible pain with resisted subscapularis. Negative empty can, slightly positive Hawkins. Passive external rotation is to about 60-70. This is in comparison to passive external rotation of the left shoulder to about 80. No tenderness over the bicipital groove. Negative O'Briens. No palpable crepitus. Neurovascularly intact distally.  MSK ultrasound of the right shoulder was performed. Limited images were obtained. AC joint shows some mild arthritic changes. There is an area of hypoechoic change on the undersurface of the distal supraspinatus tendon. Subscapularis and infraspinatus appear to be within normal limits.  Biceps tendon also appears unremarkable. Findings can be compatible with an undersurface tear of the distal supraspinatus tendon.       Assessment & Plan:  Right shoulder pain with ultrasound evidence of rotator cuff tendinopathy/supraspinatus tendon tear-rule out glenohumeral DJD Status post pacemaker placement  Although the ultrasound shows findings consistent with a possible undersurface tear of the supraspinatus, I would like to get some plain x-rays including an axillary view of his right shoulder. Patient will get those x-rays today and will follow-up with me tomorrow morning. We will base our treatment on his x-ray findings. We discussed the possibility of a cortisone injection or possibly a trial of topical nitroglycerin for the rotator cuff tear seen on ultrasound.

## 2014-12-04 ENCOUNTER — Ambulatory Visit (INDEPENDENT_AMBULATORY_CARE_PROVIDER_SITE_OTHER): Payer: PPO | Admitting: Sports Medicine

## 2014-12-04 ENCOUNTER — Encounter: Payer: Self-pay | Admitting: Sports Medicine

## 2014-12-04 VITALS — BP 151/85 | HR 58

## 2014-12-04 DIAGNOSIS — M75111 Incomplete rotator cuff tear or rupture of right shoulder, not specified as traumatic: Secondary | ICD-10-CM

## 2014-12-04 DIAGNOSIS — M25511 Pain in right shoulder: Secondary | ICD-10-CM

## 2014-12-04 DIAGNOSIS — M75110 Incomplete rotator cuff tear or rupture of unspecified shoulder, not specified as traumatic: Secondary | ICD-10-CM | POA: Insufficient documentation

## 2014-12-04 MED ORDER — NITROGLYCERIN 0.2 MG/HR TD PT24: MEDICATED_PATCH | TRANSDERMAL | Status: DC

## 2014-12-04 NOTE — Progress Notes (Signed)
Patient ID: Russell Griffith, male   DOB: 1946/02/28, 69 y.o.   MRN: [id number]  Patient comes in today at my request to discuss x-ray findings of his right shoulder. X-rays show no significant glenohumeral DJD. He does have some mild to moderate ac DJD. His ultrasound yesterday showed an undersurface tear of the distal supraspinatus tendon. He is here today to discuss treatment of his injury. Please see the office notes from yesterday's visit for further details regarding history and physical exam findings. In short, this patient appears to have findings consistent with a partial supraspinatus rotator cuff tear and mild adhesive capsulitis. We are going to try this patient on a topical nitroglycerin protocol and couple this with a home exercise program consisting of range of motion exercises and rotator cuff strengthening. Patient will follow-up in approximately 4 weeks for reevaluation and a repeat ultrasound. If his symptoms persist despite conservative treatment then we will consider referral to orthopedics for consideration of arthroscopy (patient is unable to undergo MRI due to a pacemaker).Of note,  If this is to be considered patient would like to see Dr. Joni Fears.

## 2014-12-04 NOTE — Patient Instructions (Signed)

## 2015-01-02 ENCOUNTER — Ambulatory Visit: Payer: Medicare Other | Admitting: Sports Medicine

## 2015-01-08 ENCOUNTER — Ambulatory Visit (INDEPENDENT_AMBULATORY_CARE_PROVIDER_SITE_OTHER): Payer: PPO | Admitting: Sports Medicine

## 2015-01-08 VITALS — BP 139/60 | HR 55 | Ht 70.0 in | Wt 195.0 lb

## 2015-01-08 DIAGNOSIS — M545 Low back pain, unspecified: Secondary | ICD-10-CM

## 2015-01-08 MED ORDER — METHYLPREDNISOLONE ACETATE 80 MG/ML IJ SUSP: 80.0000 mg | Freq: Once | INTRAMUSCULAR | Status: DC

## 2015-01-08 MED ORDER — CYCLOBENZAPRINE HCL 10 MG PO TABS: ORAL_TABLET | ORAL | Status: DC

## 2015-01-08 NOTE — Progress Notes (Signed)
   Subjective:    Patient ID: Russell Griffith, male    DOB: 1946/02/15, 69 y.o.   MRN: [id number]  HPI  Patient comes in today for follow-up on right shoulder pain. Previous ultrasound demonstrated an undersurface tear of his distal supraspinatus tendon. X-rays show some mild AC DJD. Unfortunately he continues to struggle with shoulder pain with activity. It is primarily lateral shoulder pain. Is present primarily with abduction. He has been using his nitroglycerin patches but has been unable to do his home exercises due to pain. He is also complaining of new onset low back pain. He was helping his daughter move a piece of furniture over the weekend and in an effort to protect his right shoulder wound up injuring his back. He now has pain and spasm diffusely across his low back. Difficulty sleeping at night. No significant problems with his lumbar spine in the past. He denies radiating pain into his legs. No associated numbness or tingling. He has been taking some Tylenol as well as using heat in both of these do seem to be helping.    Review of Systems     Objective:   Physical Exam Well-developed, well-nourished. No acute distress  Lumbar spine: Patient has limited lumbar mobility secondary to pain. There is diffuse spasm of the paraspinal musculature in his lumbar spine bilaterally. No gross focal neurological deficit of either lower extremity.  Right shoulder: Good range of motion. Positive painful arc. Positive empty can, positive Hawkins. No tenderness over the before meals joint. His rotator cuff strength is 5/5 but reproducible pain with resisted supraspinatus. Also pain with resisted internal rotation. No tenderness over the bicipital groove. Neurovascularly intact distally.       Assessment & Plan:  Persistent right shoulder pain likely secondary to partial rotator cuff tear Low back pain secondary to lumbar strain  80 mg of Depo-Medrol IM in an effort to help calm down his low  back pain. He may find that this helped the shoulder as well. Continue with moist heat. Flexeril 10 mg daily at bedtime when necessary for spasm. I will call him later this week for a check on his progress. If symptoms persist I would start with plain x-rays of his lumbar spine. I discussed options for his right shoulder including subacromial cortisone injection versus referral to orthopedics (Dr. Joni Fears).  Patient would like to think over his options. I elected not to repeat his ultrasound today given the fact that he has not really noticed any improvement in his symptoms.

## 2015-01-11 ENCOUNTER — Telehealth: Payer: Self-pay | Admitting: Sports Medicine

## 2015-01-11 NOTE — Telephone Encounter (Signed)
I spoke with the patient on the phone today. He is feeling better after his IM Depo-Medrol injection earlier this week. Still some stiffness in his low back but his pain is resolving. I recommended that he do some simple stretching and follow-up again with me in 3-4 weeks. His shoulder is feeling a little better as well.

## 2015-02-04 ENCOUNTER — Encounter: Payer: Self-pay | Admitting: Sports Medicine

## 2015-02-04 ENCOUNTER — Ambulatory Visit (INDEPENDENT_AMBULATORY_CARE_PROVIDER_SITE_OTHER): Payer: PPO | Admitting: Sports Medicine

## 2015-02-04 VITALS — BP 133/78 | Ht 70.0 in | Wt 195.0 lb

## 2015-02-04 DIAGNOSIS — M25511 Pain in right shoulder: Secondary | ICD-10-CM

## 2015-02-04 MED ORDER — METHYLPREDNISOLONE ACETATE 40 MG/ML IJ SUSP
40.0000 mg | Freq: Once | INTRAMUSCULAR | Status: AC
  Administered 2015-02-04: 40 mg via INTRA_ARTICULAR

## 2015-02-04 NOTE — Progress Notes (Signed)
   Subjective:    Patient ID: Russell Griffith, male    DOB: [birth year], 69 y.o.   MRN: [id number]  HPI  Patient comes in today for follow-up on right shoulder pain as well as low back pain. Low back pain has improved. Right shoulder pain persists. Still along the lateral aspect of his shoulder and worse with reaching out away from his body. He has tried nitroglycerin. He is doing his JOBE exercises. Previous ultrasound suggested an undersurface tear of the supraspinatus tendon. Patient is unable to pursue an MRI due to pacemaker.    Review of Systems     Objective:   Physical Exam Well-developed, well-nourished. No acute distress.  Right shoulder: Forward flexion is 180. Abduction is 150. Positive painful arc. External rotation is 60-70. Rotator cuff strength is 5/5. Reproducible pain with empty can. Neurovascularly intact distally.       Assessment & Plan:  Persistent right shoulder pain with ultrasound evidence of probable partial thickness supra spinatus tear  Patient cannot undergo MRI. His symptoms are not significant enough that he was to consider surgery. For these reasons we will try a subacromial cortisone injection. Patient will continue with his home exercises and follow-up with me in 4 weeks. I would like to repeat his ultrasound at that time. He is instructed not to do any upper body resistance training at least until follow-up.  Consent obtained and verified. Time-out conducted. Noted no overlying erythema, induration, or other signs of local infection. Skin prepped in a sterile fashion. Topical analgesic spray: Ethyl chloride. Joint: right shoulder (subacromial) Needle: 25g 1.5 inch Completed without difficulty. Meds: 3cc 1% xylocaine, 1cc (40mg ) depomedrol  Advised to call if fevers/chills, erythema, induration, drainage, or persistent bleeding.

## 2015-02-13 ENCOUNTER — Other Ambulatory Visit: Payer: Self-pay | Admitting: Internal Medicine

## 2015-02-13 ENCOUNTER — Ambulatory Visit (INDEPENDENT_AMBULATORY_CARE_PROVIDER_SITE_OTHER): Payer: PPO | Admitting: *Deleted

## 2015-02-13 DIAGNOSIS — I495 Sick sinus syndrome: Secondary | ICD-10-CM

## 2015-02-13 NOTE — Progress Notes (Signed)
Remote pacemaker transmission.   

## 2015-02-14 LAB — MDC_IDC_ENUM_SESS_TYPE_REMOTE
Battery Remaining Longevity: 138 mo
Battery Remaining Percentage: 100 %
Brady Statistic RA Percent Paced: 76 %
Date Time Interrogation Session: 20160316113900
Implantable Pulse Generator Serial Number: 111255
Lead Channel Impedance Value: 506 Ohm
Lead Channel Pacing Threshold Amplitude: 0.7 V
Lead Channel Pacing Threshold Pulse Width: 0.4 ms
Lead Channel Setting Pacing Amplitude: 2 V
Lead Channel Setting Pacing Amplitude: 2.4 V
Lead Channel Setting Sensing Sensitivity: 2.5 mV
MDC IDC MSMT LEADCHNL RA IMPEDANCE VALUE: 615 Ohm
MDC IDC SET LEADCHNL RV PACING PULSEWIDTH: 0.7 ms
MDC IDC STAT BRADY RV PERCENT PACED: 3 %
Zone Setting Detection Interval: 375 ms

## 2015-03-05 ENCOUNTER — Encounter: Payer: Self-pay | Admitting: Cardiology

## 2015-03-05 ENCOUNTER — Ambulatory Visit: Payer: PPO | Admitting: Sports Medicine

## 2015-03-06 ENCOUNTER — Encounter: Payer: Self-pay | Admitting: Sports Medicine

## 2015-03-06 ENCOUNTER — Ambulatory Visit (INDEPENDENT_AMBULATORY_CARE_PROVIDER_SITE_OTHER): Payer: PPO | Admitting: Sports Medicine

## 2015-03-06 VITALS — BP 140/52 | Ht 70.0 in | Wt 195.0 lb

## 2015-03-06 DIAGNOSIS — M25511 Pain in right shoulder: Secondary | ICD-10-CM

## 2015-03-07 NOTE — Progress Notes (Signed)
   Subjective:    Patient ID: Russell Griffith, male    DOB: 1945-12-02, 69 y.o.   MRN: [id number]  HPI   Patient comes in today for follow-up on right shoulder pain and low back pain. Low back pain has resolved but right shoulder pain persists. This is despite a subacromial cortisone injection and a trial of topical nitroglycerin. He has also been diligent with his home exercises. He continues to complain of pain along the anterior lateral shoulder worse with activity. Previous ultrasound suggested a small undersurface tear of the supraspinatus tendon. Patient cannot undergo MRI due to pacemaker.    Review of Systems     Objective:   Physical Exam Well-developed, well-nourished. No acute distress. Vital signs reviewed  Right shoulder: Forward flexion is to 170. Abduction is to 150. External rotation 70. Internal rotation 80-90. Positive painful arc. No tenderness over the [facility] joint nor over the bicipital groove. Rotator cuff strength is 5/5 but is slightly reproducible pain with resisted supraspinatus and subscapularis. Neurovascularly intact distally.       Assessment & Plan:  Persistent right shoulder pain secondary to rotator cuff tendinopathy, AC DJD, mild adhesive capsulitis, and possible partial rotator cuff tear  Patient's symptoms of been present now for 6 months. He has failed conservative treatment. He may have a little bit of adhesive capsulitis but otherwise his motion and strength are well-preserved. He is not able to undergo an MRI due to his pacemaker. I have recommended that the patient see his orthopedist (Dr. Joni Fears) and discuss merits of possible arthroscopy. He may benefit greatly from an arthroscopic debridement. I will defer further workup and treatment to the discretion of Dr. Durward Griffith. Patient will follow-up with me as needed.

## 2015-03-08 ENCOUNTER — Ambulatory Visit (INDEPENDENT_AMBULATORY_CARE_PROVIDER_SITE_OTHER): Payer: PPO | Admitting: Cardiology

## 2015-03-08 ENCOUNTER — Encounter: Payer: Self-pay | Admitting: Cardiology

## 2015-03-08 VITALS — BP 120/60 | HR 56 | Ht 70.0 in | Wt 195.4 lb

## 2015-03-08 DIAGNOSIS — I370 Nonrheumatic pulmonary valve stenosis: Secondary | ICD-10-CM

## 2015-03-08 DIAGNOSIS — I25810 Atherosclerosis of coronary artery bypass graft(s) without angina pectoris: Secondary | ICD-10-CM

## 2015-03-08 DIAGNOSIS — I351 Nonrheumatic aortic (valve) insufficiency: Secondary | ICD-10-CM | POA: Diagnosis not present

## 2015-03-08 DIAGNOSIS — I779 Disorder of arteries and arterioles, unspecified: Secondary | ICD-10-CM

## 2015-03-08 DIAGNOSIS — I4891 Unspecified atrial fibrillation: Secondary | ICD-10-CM

## 2015-03-08 DIAGNOSIS — E785 Hyperlipidemia, unspecified: Secondary | ICD-10-CM

## 2015-03-08 DIAGNOSIS — Z7901 Long term (current) use of anticoagulants: Secondary | ICD-10-CM | POA: Diagnosis not present

## 2015-03-08 DIAGNOSIS — R17 Unspecified jaundice: Secondary | ICD-10-CM

## 2015-03-08 DIAGNOSIS — I10 Essential (primary) hypertension: Secondary | ICD-10-CM

## 2015-03-08 DIAGNOSIS — I739 Peripheral vascular disease, unspecified: Secondary | ICD-10-CM

## 2015-03-08 LAB — LIPID PANEL
CHOL/HDL RATIO: 3
Cholesterol: 115 mg/dL (ref 0–200)
HDL: 34.6 mg/dL — ABNORMAL LOW (ref >39.00)
LDL Cholesterol: 70 mg/dL (ref 0–99)
NonHDL: 80.4
Triglycerides: 54 mg/dL (ref 0.0–149.0)
VLDL: 10.8 mg/dL (ref 0.0–40.0)

## 2015-03-08 NOTE — Patient Instructions (Signed)
Your physician recommends that you continue on your current medications as directed. Please refer to the Current Medication list given to you today.  Your physician recommends that you return for a FASTING lipid profile: today

## 2015-03-08 NOTE — Progress Notes (Signed)
Cardiology Office Note   Date:  03/08/2015   ID:  Russell Griffith, DOB 04/30/1946, MRN [id number]  PCP:  Walker Kehr, MD  Cardiologist:  Dola Argyle, MD   Chief Complaint  Patient presents with  . Appointment    Follow-up coronary artery disease      History of Present Illness: Russell Griffith is a 69 y.o. male who presents  Today to follow up coronary disease and paroxysmal atrial fibrillation. Patient is doing very well. Since his atrial fibrillation , he has had excellent result. He remains in sinus rhythm. He remains active. He has had some difficulty with a partial rotator cuff tear in his right shoulder. He is not having any chest pain or significant shortness of breath.  He has known coronary disease post CABG. I have outlined his significant problems at the bottom of this note.    Past Medical History  Diagnosis Date  . CAD (coronary artery disease)     Myoview March, 2011, excellent exercise, hypertensive response, no scar or ischemia, EF 61%, mild palpitations peak stress with infrequent PACs and PVCs.  Marland Creswell Hyperlipidemia     Low HDL  . GERD (gastroesophageal reflux disease)     Barrett's esophagus  . Colonic polyp   . Palpitations      exercise palpitations, probably A. fib-not VT,Pindolol not tolerated, CAD, bradycardia,Tickosyn needs hospital +/_ side effects, amiodarone & sotalol could need pacer,Multaq less effective not tried, atrial fib ablation could be considered at low risk  . Foot pain     Chronic lateral foot pain-resolved with use of orthotics '09  . Aortic stenosis     Mild, echo, April, 2012  . Carotid artery disease     Doppler November, 2010, 0-39% bilateral followup 2 years  . Warfarin anticoagulation     Coumadin for atrial fib  . Aortic insufficiency      Mild, echo, 2010 / mild/moderate, echo, 2012  . Elevated bilirubin     Mild chronic elevation, 2.0 January, 2011 stable  . BPH (benign prostatic hyperplasia)   . Drug intolerance    Pindolol before exercise not tolerated  . Ejection fraction     EF 60-65%, echo, September, 2010 / EF 60-65% echo, April, 2012  . Hypokalemia     Potassium 3.6 before potassium started April, 2012  . Hx of colonoscopy   . Chronic foot pain 2009    Resolved with use of orthotics  . Intolerance of drug     Fish oil, bilirubin, nacin  . BPH (benign prostatic hyperplasia)   . Pulmonic stenosis     Mild, echo, April, 2012  . TSH (thyroid-stimulating hormone deficiency)   . Hemorrhoids     September, 2012  . Numbness of toes     Slight numbness of one toe, September, 2012  . Lung granuloma     Left  lung chest x-ray July, 2013  . A-fib      Admission for Tikosyn load, March 20, 2011, converted after first dose, QTC 440 at the time of discharge. / Office note March 27, 2011, clinically in and out of atrial, QTC 500 ms, Dr.Klein decreased Tikosyn to 316m BID.  Atrial fib at rest February 25, 2011 / [facility]  . PVC's (premature ventricular contractions)   . Atrial flutter     Status post ablation  . HTN (hypertension)     Controlled at rest, hypertension on treadmill, sensitive to medications  . Shortness of  breath 07/15/2012    "can happen at any time; related to AF and bradycardia"  . Barrett's esophagus   . Migraines 07/15/2012    "years ago; before glasses changed"  . Primary osteoarthritis of left knee     Mild  . Mitral regurgitation   . Tricuspid regurgitation   . HX: anticoagulation     Past Surgical History  Procedure Laterality Date  . Insert / replace / remove pacemaker  07/15/2012    initial placement  . Tonsillectomy  1956  . Coronary artery bypass graft  2000    CABG X5  . Radiofrequency  ablation for heart arrythmia    . Permanent pacemaker insertion N/A 07/15/2012    Procedure: PERMANENT PACEMAKER INSERTION;  Surgeon: Deboraha Sprang, MD;  Location[facility] CATH LAB;  Service: Cardiovascular;  Laterality: N/A;    Patient Active Problem List    Diagnosis Date Noted  . CAD (coronary artery disease)     Priority: High  . Hx of CABG     Priority: High  . Drug therapy     Priority: High  . Drug intolerance     Priority: High  . Oral anticoagulation     Priority: High  . Hypokalemia     Priority: High  . A-fib     Priority: High  . Partial tear of rotator cuff 12/04/2014  . Well adult exam 07/03/2014  . Jock itch 07/03/2014  . Dyslipidemia 03/06/2014  . Ejection fraction   . Diverticulitis of colon without hemorrhage 08/31/2013  . Mitral regurgitation   . Tricuspid regurgitation   . HX: anticoagulation   . Abdominal distention 01/23/2013  . Diffuse anterior T-wave inversions 10/24/2012  . PPM-[employer] 07/16/2012  . 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 Lung granuloma   . Shortness of breath   . Nausea   . Aortic stenosis   . Routine health maintenance 04/11/2012  . Plantar fasciitis of right foot 04/04/2012  . Tumescence 02/23/2012  . Numbness of toes   . HTN (hypertension)   . Hyperlipidemia   . Pulmonic stenosis   . Aortic insufficiency   . QT prolongation on Tikosyn 03/26/2011  . PVC's (premature ventricular contractions)   . Carotid artery disease   . Elevated bilirubin   . THYROID STIMULATING HORMONE, ABNORMAL 04/01/2010  . DERANGEMENT OF MENISCUS NOT ELSEWHERE CLASSIFIED 12/03/2009  . Knee pain 12/03/2009  . BENIGN PROSTATIC HYPERTROPHY, WITH OBSTRUCTION 09/30/2009  . Unspecified disorders of bursae and tendons in shoulder region 08/22/2009  . 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 ADENOMATOUS COLONIC POLYP 03/06/2008  . ANXIETY DEPRESSION 03/06/2008  . ESOPHAGITIS 03/06/2008  . ESOPHAGEAL STENOSIS 03/06/2008  . BARRETTS ESOPHAGUS 03/06/2008  . GASTRITIS, CHRONIC 03/06/2008  . DUODENITIS 03/06/2008  . DIVERTICULOSIS, COLON 03/06/2008  . PROSTATITIS, HX OF 03/06/2008  . OSTEOARTHRITIS, KNEE, LEFT 11/04/2007  . FOOT PAIN,  LEFT 11/04/2007  . GERD 08/09/2007  . COLONIC POLYPS, HX OF 08/09/2007      Current Outpatient Prescriptions  Medication Sig Dispense Refill  . aspirin 81 MG tablet Take 81 mg by mouth daily.    Marland Sholtz atorvastatin (LIPITOR) 20 MG tablet Take 1 tablet (20 mg total) by mouth daily. 30 tablet 6  . cyclobenzaprine (FLEXERIL) 10 MG tablet Take one tablet at bedtime as needed 30 tablet 0  . desonide (DESOWEN) 0.05 % cream Apply topically. As need for skin    . esomeprazole (NEXIUM) 20 MG capsule Take 20 mg by mouth  daily at 12 noon.    . Multiple Vitamin (MULTIVITAMIN WITH MINERALS) TABS Take 1 tablet by mouth daily.    . ramipril (ALTACE) 2.5 MG capsule TAKE (1) CAPSULE DAILY. 30 capsule 5   No current facility-administered medications for this visit.    Allergies:   Sulfonamide derivatives    Social History:  The patient  reports that he has never smoked. He has never used smokeless tobacco. He reports that he drinks about 12.6 oz of alcohol per week. He reports that he does not use illicit drugs.   Family History:  The patient's family history includes Coronary artery disease (age of onset: 51) in his other; Diabetes in his father and other; Hyperlipidemia in an other family member; Hypertension in an other family member; Multiple myeloma in his mother; Renal Disease in his father. There is no history of Colon cancer.    ROS:  Please see the history of present illness.      Patient denies fever, chills, headache, sweats, rash, change in vision, change in hearing, chest pain, cough, nausea or vomiting, urinary symptoms. All other systems are reviewed and are negative.   PHYSICAL EXAM: VS:  BP 120/60 mmHg  Pulse 56  Ht _0  (1.778 m)  Wt 195 lb 6.4 oz (88.633 kg)  BMI 28.04 kg/m2 ,  Patient is stable. He is oriented to person time and place. Affect is normal.  Head is atraumatic. Sclera and conjunctiva are normal. There is no jugular venous distention. Lungs are clear. Respiratory effort  is not labored. Cardiac exam reveals an S1 and S2. Abdomen is soft. There is no peripheral edema. There are no musculoskeletal deformities. There no skin rashes.  EKG:    EKG is done today and reviewed by me. There is atrial pacing. The QRS is unchanged. There are mild anterior ST-T wave changes that are on changed.   Recent Labs: 07/03/2014: ALT 34; BUN 20; Creatinine 1.0; Hemoglobin 15.4; Platelets 172.0; Potassium 4.3; Sodium 140 09/04/2014: TSH 4.13    Lipid Panel    Component Value Date/Time   CHOL 115 03/08/2015 0920   TRIG 54.0 03/08/2015 0920   HDL 34.60* 03/08/2015 0920   CHOLHDL 3 03/08/2015 0920   VLDL 10.8 03/08/2015 0920   LDLCALC 70 03/08/2015 0920      Wt Readings from Last 3 Encounters:  03/08/15 195 lb 6.4 oz (88.633 kg)  03/06/15 195 lb (88.451 kg)  02/04/15 195 lb (88.451 kg)      Current medicines are reviewed   The patient understands his medications quite well.     ASSESSMENT AND PLAN:

## 2015-03-08 NOTE — Assessment & Plan Note (Signed)
Historically there had been question of mild pulmonic stenosis in the past. This has not been noted on  recent echoes.

## 2015-03-08 NOTE — Assessment & Plan Note (Signed)
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

## 2015-03-08 NOTE — Assessment & Plan Note (Signed)
At the time of the last echo in 2014, aortic insufficiency was mild and aortic stenosis was very mild. This can be followed over time.

## 2015-03-08 NOTE — Assessment & Plan Note (Signed)
Recently he has had slight increase in his blood pressure. He will begin to check it at home. He is on a very small dose of an ACE inhibitor. This could be increased if needed. Historically he has been very sensitive to  Most medications.Marland Westendorf

## 2015-03-08 NOTE — Assessment & Plan Note (Signed)
The patient did not feel well with 40 mg of atorvastatin. He has tolerated 20 mg. Over time we have accepted this knowing that his LDL was in the range of 80. His lipids will be checked again. I would be hesitent to try to push his statin dose based on how he is felt in the past. I explained to him that Zetia could be added. I told him that I felt there was no indication for PSK9  Therapy.

## 2015-03-08 NOTE — Assessment & Plan Note (Signed)
The patient has had a successful atrial fibrillation ablation. He had chosen Dr. Rolland Porter at [facility].   In the past we had used Tikosyn. There were difficulties with QT interval and dosing.  Tikosyn was stopped after his atrial fibrillation. He is holding sinus rhythm.

## 2015-03-08 NOTE — Assessment & Plan Note (Signed)
The patient had been on anticoagulation for his atrial fibrillation and around the time of the atrial fibrillation. Ultimately when he held sinus rhythm for a prolonged period of time, anticoagulation was stopped.

## 2015-03-08 NOTE — Assessment & Plan Note (Signed)
Carotid Dopplers show that he does have disease. There is a follow-up Doppler scheduled for June, 2016

## 2015-03-08 NOTE — Assessment & Plan Note (Signed)
The patient underwent bypass surgery in the past. His last nuclear study was November, 2013. There was no ischemia. The study was not gated. However an echo done after that shows good LV function. No further workup is needed at this time.

## 2015-03-13 ENCOUNTER — Other Ambulatory Visit: Payer: Self-pay | Admitting: Cardiology

## 2015-03-13 ENCOUNTER — Encounter: Payer: Self-pay | Admitting: Internal Medicine

## 2015-04-01 ENCOUNTER — Telehealth: Payer: Self-pay | Admitting: Gastroenterology

## 2015-04-01 NOTE — Telephone Encounter (Signed)
Patient states he has had nausea off and on for a week now. He wants to be seen for this. Scheduled with Lori Hvozdovic, PA-C on 04/08/15 at 1:15 PM.

## 2015-04-05 ENCOUNTER — Encounter: Payer: Self-pay | Admitting: *Deleted

## 2015-04-08 ENCOUNTER — Encounter: Payer: Self-pay | Admitting: Physician Assistant

## 2015-04-08 ENCOUNTER — Ambulatory Visit (INDEPENDENT_AMBULATORY_CARE_PROVIDER_SITE_OTHER): Payer: PPO | Admitting: Physician Assistant

## 2015-04-08 ENCOUNTER — Other Ambulatory Visit (INDEPENDENT_AMBULATORY_CARE_PROVIDER_SITE_OTHER): Payer: PPO

## 2015-04-08 VITALS — BP 136/66 | HR 60 | Ht 69.5 in | Wt 200.4 lb

## 2015-04-08 DIAGNOSIS — Z860100 Personal history of colon polyps, unspecified: Secondary | ICD-10-CM

## 2015-04-08 DIAGNOSIS — K648 Other hemorrhoids: Secondary | ICD-10-CM

## 2015-04-08 DIAGNOSIS — K219 Gastro-esophageal reflux disease without esophagitis: Secondary | ICD-10-CM | POA: Diagnosis not present

## 2015-04-08 DIAGNOSIS — R1013 Epigastric pain: Secondary | ICD-10-CM

## 2015-04-08 LAB — HEPATIC FUNCTION PANEL
ALT: 28 U/L (ref 0–53)
AST: 28 U/L (ref 0–37)
Albumin: 4.2 g/dL (ref 3.5–5.2)
Alkaline Phosphatase: 80 U/L (ref 39–117)
BILIRUBIN TOTAL: 3 mg/dL — AB (ref 0.2–1.2)
Bilirubin, Direct: 0.5 mg/dL — ABNORMAL HIGH (ref 0.0–0.3)
TOTAL PROTEIN: 7 g/dL (ref 6.0–8.3)

## 2015-04-08 LAB — LIPASE: LIPASE: 26 U/L (ref 11.0–59.0)

## 2015-04-08 MED ORDER — PANTOPRAZOLE SODIUM 40 MG PO TBEC: 40.0000 mg | DELAYED_RELEASE_TABLET | Freq: Every day | ORAL | Status: DC

## 2015-04-08 MED ORDER — MOVIPREP 100 G PO SOLR: 1.0000 | Freq: Once | ORAL | Status: DC

## 2015-04-08 MED ORDER — HYDROCORTISONE 2.5 % RE CREA: 1.0000 "application " | TOPICAL_CREAM | Freq: Two times a day (BID) | RECTAL | Status: DC

## 2015-04-08 MED ORDER — ESOMEPRAZOLE MAGNESIUM 40 MG PO CPDR
40.0000 mg | DELAYED_RELEASE_CAPSULE | Freq: Every day | ORAL | Status: DC
Start: 2015-04-08 — End: 2015-04-08

## 2015-04-08 NOTE — Patient Instructions (Signed)
You have been scheduled for a colonoscopy. Please follow written instructions given to you at your visit today.  Please pick up your prep supplies at the pharmacy within the next 1-3 days. If you use inhalers (even only as needed), please bring them with you on the day of your procedure.  Your physician has requested that you go to www.startemmi.com and enter the access code given to you at your visit today. This web site gives a general overview about your procedure. However, you should still follow specific instructions given to you by our office regarding your preparation for the procedure.  Your physician has requested that you go to the basement for lab work before leaving today.  You have been scheduled for an abdominal ultrasound at [facility] (1st floor of hospital) on 04/11/15 at 8 am. Please arrive 15 minutes prior to your appointment for registration. Make certain not to have anything to eat or drink 6 hours prior to your appointment. Should you need to reschedule your appointment, please contact radiology at [phone]. This test typically takes about 30 minutes to perform.  We have sent  medications to your pharmacy for you to pick up at your convenience.    Gastroesophageal Reflux Disease, Adult Gastroesophageal reflux disease (GERD) happens when acid from your stomach flows up into the esophagus. When acid comes in contact with the esophagus, the acid causes soreness (inflammation) in the esophagus. Over time, GERD may create small holes (ulcers) in the lining of the esophagus. CAUSES   Increased body weight. This puts pressure on the stomach, making acid rise from the stomach into the esophagus.  Smoking. This increases acid production in the stomach.  Drinking alcohol. This causes decreased pressure in the lower esophageal sphincter (valve or ring of muscle between the esophagus and stomach), allowing acid from the stomach into the esophagus.  Late evening meals  and a full stomach. This increases pressure and acid production in the stomach.  A malformed lower esophageal sphincter. Sometimes, no cause is found. SYMPTOMS   Burning pain in the lower part of the mid-chest behind the breastbone and in the mid-stomach area. This may occur twice a week or more often.  Trouble swallowing.  Sore throat.  Dry cough.  Asthma-like symptoms including chest tightness, shortness of breath, or wheezing. DIAGNOSIS  Your caregiver may be able to diagnose GERD based on your symptoms. In some cases, X-rays and other tests may be done to check for complications or to check the condition of your stomach and esophagus. TREATMENT  Your caregiver may recommend over-the-counter or prescription medicines to help decrease acid production. Ask your caregiver before starting or adding any new medicines.  HOME CARE INSTRUCTIONS   Change the factors that you can control. Ask your caregiver for guidance concerning weight loss, quitting smoking, and alcohol consumption.  Avoid foods and drinks that make your symptoms worse, such as:  Caffeine or alcoholic drinks.  Chocolate.  Peppermint or mint flavorings.  Garlic and onions.  Spicy foods.  Citrus fruits, such as oranges, lemons, or limes.  Tomato-based foods such as sauce, chili, salsa, and pizza.  Fried and fatty foods.  Avoid lying down for the 3 hours prior to your bedtime or prior to taking a nap.  Eat small, frequent meals instead of large meals.  Wear loose-fitting clothing. Do not wear anything tight around your waist that causes pressure on your stomach.  Raise the head of your bed 6 to 8 inches with wood blocks to  help you sleep. Extra pillows will not help.  Only take over-the-counter or prescription medicines for pain, discomfort, or fever as directed by your caregiver.  Do not take aspirin, ibuprofen, or other nonsteroidal anti-inflammatory drugs (NSAIDs). SEEK IMMEDIATE MEDICAL CARE IF:    You have pain in your arms, neck, jaw, teeth, or back.  Your pain increases or changes in intensity or duration.  You develop nausea, vomiting, or sweating (diaphoresis).  You develop shortness of breath, or you faint.  Your vomit is green, yellow, black, or looks like coffee grounds or blood.  Your stool is red, bloody, or black. These symptoms could be signs of other problems, such as heart disease, gastric bleeding, or esophageal bleeding. MAKE SURE YOU:   Understand these instructions.  Will watch your condition.  Will get help right away if you are not doing well or get worse. Document Released: 08/26/2005 Document Revised: 02/08/2012 Document Reviewed: 06/05/2011 [facility] Patient Information 2015 [place], [place]. This information is not intended to replace advice given to you by your health care provider. Make sure you discuss any questions you have with your health care provider.

## 2015-04-08 NOTE — Progress Notes (Signed)
Patient ID: Russell Griffith, male   DOB: 1946/11/24, 69 y.o.   MRN: [id number]     History of Present Illness: Russell Griffith is a pleasant 69 year old male well known to Dr. Ardis Hughs with a history of Barrett's esophagus and adenomatous colon polyps. His last colonoscopy was performed in May 2011, he had one 3 mm polyp removed at that time and was also noted to have diverticulosis. Pathology was consistent with an adenoma and he was advised to have surveillance in 5 years which he would like to schedule at this time. Patient also has a history of Barrett's esophagus. His last EGD was in 2013. No Barrett's was noted on biopsies and he was advised that no further surveillance endoscopies were needed. He reports that over the past 3 or 4 weeks he has been having intermittent nausea that is worse on an empty stomach. At the onset he thought he had a stomach bug and after the first week of the nausea his symptoms have been improving. He has not vomited. He complains of epigastric bloating and discomfort. His appetite is good. He gets very gassy after he eats. He has no right upper quadrant pain. He has no dysphagia. He reports that after eating he will get a pressure in the epigastric area that radiates around both sides and into his chest. He has been having episodes of nocturnal regurgitation. He has had no weight loss, dark urine, or light stools, he has used Pepto-Bismol on several occasions with relief of his nausea. He has been using over-the-counter Nexium 20 mg but not on a regular basis. He also reports that he has internal hemorrhoids that occasionally prolapse down. As of today they are not giving him problems but he would like them evaluated at the time of colonoscopy and would like to know if he is a candidate for banding. He has a history of coronary artery disease and atrial fibrillation. He is status post cardioversion. He is not on any anticoagulation. He also has pulmonary stenosis, aortic  insufficiency, mitral regurgitation, a lung granuloma, osteoarthritis of his name BPH, and anxiety and depression.   Past Medical History  Diagnosis Date  . CAD (coronary artery disease)     Myoview March, 2011, excellent exercise, hypertensive response, no scar or ischemia, EF 61%, mild palpitations peak stress with infrequent PACs and PVCs.  Marland Sackrider Hyperlipidemia     Low HDL  . GERD (gastroesophageal reflux disease)     Barrett's esophagus  . Colonic polyp   . Palpitations      exercise palpitations, probably A. fib-not VT,Pindolol not tolerated, CAD, bradycardia,Tickosyn needs hospital +/_ side effects, amiodarone & sotalol could need pacer,Multaq less effective not tried, atrial fib ablation could be considered at low risk  . Foot pain     Chronic lateral foot pain-resolved with use of orthotics '09  . Aortic stenosis     Mild, echo, April, 2012  . Carotid artery disease     Doppler November, 2010, 0-39% bilateral followup 2 years  . Warfarin anticoagulation     Coumadin for atrial fib  . Aortic insufficiency      Mild, echo, 2010 / mild/moderate, echo, 2012  . Elevated bilirubin     Mild chronic elevation, 2.0 January, 2011 stable  . BPH (benign prostatic hyperplasia)   . Drug intolerance     Pindolol before exercise not tolerated  . Ejection fraction     EF 60-65%, echo, September, 2010 / EF 60-65% echo, April, 2012  .  Hypokalemia     Potassium 3.6 before potassium started April, 2012  . Hx of colonoscopy   . Chronic foot pain 2009    Resolved with use of orthotics  . Intolerance of drug     Fish oil, bilirubin, nacin  . BPH (benign prostatic hyperplasia)   . Pulmonic stenosis     Mild, echo, April, 2012  . TSH (thyroid-stimulating hormone deficiency)   . Hemorrhoids     September, 2012  . Numbness of toes     Slight numbness of one toe, September, 2012  . Lung granuloma     Left  lung chest x-ray July, 2013  . A-fib      Admission for Tikosyn load, March 20, 2011,  converted after first dose, QTC 440 at the time of discharge. / Office note March 27, 2011, clinically in and out of atrial, QTC 500 ms, Dr.Klein decreased Tikosyn to 333m BID.  Atrial fib at rest February 25, 2011 / [facility]  . PVC's (premature ventricular contractions)   . Atrial flutter     Status post ablation  . HTN (hypertension)     Controlled at rest, hypertension on treadmill, sensitive to medications  . Shortness of breath 07/15/2012    "can happen at any time; related to AF and bradycardia"  . Barrett's esophagus   . Migraines 07/15/2012    "years ago; before glasses changed"  . Primary osteoarthritis of left knee     Mild  . Mitral regurgitation   . Tricuspid regurgitation   . HX: anticoagulation   . Diverticulosis   . Tubular adenoma of colon     Past Surgical History  Procedure Laterality Date  . Insert / replace / remove pacemaker  07/15/2012    initial placement  . Tonsillectomy  1956  . Coronary artery bypass graft  2000    CABG X5  . Radiofrequency  ablation for heart arrythmia    . Permanent pacemaker insertion N/A 07/15/2012    Procedure: PERMANENT PACEMAKER INSERTION;  Surgeon: SDeboraha Sprang MD;  [facility];  Service: Cardiovascular;  Laterality: N/A;   Family History  Problem Relation Age of Onset  . Hyperlipidemia    . Hypertension    . Coronary artery disease Other 72  . Diabetes Other   . Colon cancer Neg Hx   . Multiple myeloma Mother   . Diabetes Father   . Renal Disease Father    History  Substance Use Topics  . Smoking status: Never Smoker   . Smokeless tobacco: Never Used  . Alcohol Use: 12.6 oz/week    21 Glasses of wine per week   Current Outpatient Prescriptions  Medication Sig Dispense Refill  . aspirin 81 MG tablet Take 81 mg by mouth daily.    .Marland Kitchenatorvastatin (LIPITOR) 20 MG tablet Take 1 tablet (20 mg total) by mouth daily. 30 tablet 6  . desonide (DESOWEN) 0.05 % cream Apply topically. As need  for skin    . Multiple Vitamin (MULTIVITAMIN WITH MINERALS) TABS Take 1 tablet by mouth daily.    . ramipril (ALTACE) 2.5 MG capsule TAKE (1) CAPSULE DAILY. 30 capsule 11  . hydrocortisone (ANUSOL-HC) 2.5 % rectal cream Place 1 application rectally 2 (two) times daily. For 7 days 30 g 1  . MOVIPREP 100 G SOLR Take 1 kit (200 g total) by mouth once. 1 kit 0  . pantoprazole (PROTONIX) 40 MG tablet Take 1 tablet (40 mg total)  by mouth daily. 90 tablet 3   No current facility-administered medications for this visit.   Allergies  Allergen Reactions  . Sulfonamide Derivatives Other (See Comments)    "have no idea; mother told me I was allergic to"      Review of Systems: Gen: Denies any fever, chills, sweats, anorexia, fatigue, weakness, malaise, weight loss, and sleep disorder CV: Denies chest pain, angina, palpitations, syncope, orthopnea, PND, peripheral edema, and claudication. Resp: Denies dyspnea at rest, dyspnea with exercise, cough, sputum, wheezing, coughing up blood, and pleurisy. GI: Denies vomiting blood, jaundice, and fecal incontinence.   Denies dysphagia or odynophagia. GU : Denies urinary burning, blood in urine, urinary frequency, urinary hesitancy, nocturnal urination, and urinary incontinence. MS: Denies joint pain, limitation of movement, and swelling, stiffness, low back pain, extremity pain. Denies muscle weakness, cramps, atrophy.  Derm: Denies rash, itching, dry skin, hives, moles, warts, or unhealing ulcers.  Psych: Denies depression, anxiety, memory loss, suicidal ideation, hallucinations, paranoia, and confusion. Heme: Denies bruising, bleeding, and enlarged lymph nodes. Neuro:  Denies any headaches, dizziness, paresthesia Endo:  Denies any problems with DM, thyroid, adrenal    Endoscopies: Per history of present illness.  Physical Exam: General: Pleasant, well developed ,male in no acute distress Head: Normocephalic and atraumatic Eyes:  sclerae anicteric,  conjunctiva pink  Ears: Normal auditory acuity Lungs: Clear throughout to auscultation Heart: Regular rate and rhythm Abdomen: Soft, non distended, non-tender. No masses, no hepatomegaly. Normal bowel sounds Musculoskeletal: Symmetrical with no gross deformities  Extremities: No edema  Neurological: Alert oriented x 4, grossly nonfocal Psychological:  Alert and cooperative. Normal mood and affect  Assessment and Recommendations:  #1. Personal history of colon polyps. Patient is to be scheduled for a colonoscopy to evaluate for recurrent polyps or neoplasia.The risks, benefits, and alternatives to colonoscopy with possible biopsy and possible polypectomy were discussed with the patient and they consent to proceed.  Patient asked that his internal hemorrhoids be evaluated at that time and if he is felt to possibly be a candidate for hemorrhoidal banding he would like that scheduled. The procedure will be scheduled with Dr. Ardis Hughs.  #2. Dyspepsia. Patient's symptoms of intermittent nausea may be due to some poorly controlled reflux. An antireflux regimen has been reviewed and he will be started on Nexium 40 mg by mouth every morning 30 minutes before breakfast. A hepatic profile and lipase will be obtained along with an abdominal ultrasound to evaluate for a biliary etiology for his postprandial bloating and dyspepsia.  Further recommendations will be made pending the findings of the above.       Talah Cookston, Vita Barley PA-C 04/08/2015,

## 2015-04-09 ENCOUNTER — Other Ambulatory Visit: Payer: Self-pay | Admitting: *Deleted

## 2015-04-09 DIAGNOSIS — K219 Gastro-esophageal reflux disease without esophagitis: Secondary | ICD-10-CM

## 2015-04-09 NOTE — Progress Notes (Signed)
i agree with the above note, plan 

## 2015-04-11 ENCOUNTER — Ambulatory Visit (HOSPITAL_COMMUNITY)
Admission: RE | Admit: 2015-04-11 | Discharge: 2015-04-11 | Disposition: A | Discharge status: Home / Self Care | Payer: PPO | Source: Ambulatory Visit | Attending: Physician Assistant | Admitting: Physician Assistant

## 2015-04-11 DIAGNOSIS — K648 Other hemorrhoids: Secondary | ICD-10-CM

## 2015-04-11 DIAGNOSIS — Z860100 Personal history of colon polyps, unspecified: Secondary | ICD-10-CM

## 2015-04-11 DIAGNOSIS — R1013 Epigastric pain: Secondary | ICD-10-CM | POA: Diagnosis present

## 2015-04-11 DIAGNOSIS — R110 Nausea: Secondary | ICD-10-CM | POA: Insufficient documentation

## 2015-04-11 DIAGNOSIS — K219 Gastro-esophageal reflux disease without esophagitis: Secondary | ICD-10-CM

## 2015-04-12 ENCOUNTER — Encounter: Payer: Self-pay | Admitting: Physician Assistant

## 2015-04-15 ENCOUNTER — Other Ambulatory Visit (INDEPENDENT_AMBULATORY_CARE_PROVIDER_SITE_OTHER): Payer: PPO

## 2015-04-15 DIAGNOSIS — K219 Gastro-esophageal reflux disease without esophagitis: Secondary | ICD-10-CM | POA: Diagnosis not present

## 2015-04-15 LAB — HEPATIC FUNCTION PANEL
ALT: 26 U/L (ref 0–53)
AST: 26 U/L (ref 0–37)
Albumin: 4.1 g/dL (ref 3.5–5.2)
Alkaline Phosphatase: 81 U/L (ref 39–117)
BILIRUBIN TOTAL: 2.8 mg/dL — AB (ref 0.2–1.2)
Bilirubin, Direct: 0.5 mg/dL — ABNORMAL HIGH (ref 0.0–0.3)
Total Protein: 6.8 g/dL (ref 6.0–8.3)

## 2015-04-28 ENCOUNTER — Other Ambulatory Visit: Payer: Self-pay | Admitting: Cardiology

## 2015-05-01 ENCOUNTER — Encounter: Payer: Self-pay | Admitting: Cardiology

## 2015-05-01 ENCOUNTER — Telehealth: Payer: Self-pay

## 2015-05-01 ENCOUNTER — Ambulatory Visit (AMBULATORY_SURGERY_CENTER): Payer: PPO | Admitting: Gastroenterology

## 2015-05-01 ENCOUNTER — Encounter: Payer: PPO | Admitting: Gastroenterology

## 2015-05-01 ENCOUNTER — Encounter: Payer: Self-pay | Admitting: Gastroenterology

## 2015-05-01 VITALS — BP 161/83 | HR 57 | Temp 96.9°F | Resp 22 | Ht 69.5 in | Wt 200.0 lb

## 2015-05-01 DIAGNOSIS — D122 Benign neoplasm of ascending colon: Secondary | ICD-10-CM

## 2015-05-01 DIAGNOSIS — K5730 Diverticulosis of large intestine without perforation or abscess without bleeding: Secondary | ICD-10-CM

## 2015-05-01 DIAGNOSIS — Z86010 Personal history of colonic polyps: Secondary | ICD-10-CM | POA: Diagnosis present

## 2015-05-01 MED ORDER — SODIUM CHLORIDE 0.9 % IV SOLN: 500.0000 mL | INTRAVENOUS | Status: DC

## 2015-05-01 NOTE — Telephone Encounter (Signed)
My office will arrange referral to meet with Dr. Carlean Purl to discuss, consider hemorrhoidal banding of your medium sized internal hemorrhoids.

## 2015-05-01 NOTE — Patient Instructions (Signed)

## 2015-05-01 NOTE — Progress Notes (Signed)
Called to room to assist during endoscopic procedure.  Patient ID and intended procedure confirmed with present staff. Received instructions for my participation in the procedure from the performing physician.  

## 2015-05-01 NOTE — Progress Notes (Signed)
Report to PACU, RN, vss, BBS= Clear.  

## 2015-05-01 NOTE — Op Note (Addendum)
[place]  [address] [place], [id number]   COLONOSCOPY PROCEDURE REPORT  PATIENT: Russell Griffith, Russell Griffith  MR#: [id number] BIRTHDATE: [birth year] , 70  yrs. old GENDER: male ENDOSCOPIST: Milus Banister, MD PROCEDURE DATE:  05/01/2015 PROCEDURE:   Colonoscopy with snare polypectomy and Colonoscopy, surveillance First Screening Colonoscopy - Avg.  risk and is 50 yrs.  old or older - No.  Prior Negative Screening - Now for repeat screening. N/A  History of Adenoma - Now for follow-up colonoscopy & has been > or = to 3 yrs.  Yes hx of adenoma.  Has been 3 or more years since last colonoscopy.  Polyps removed today? Yes ASA CLASS:   Class II INDICATIONS:Surveillance due to prior colonic neoplasia and adenomas in colon (2011) and also previously. MEDICATIONS: Monitored anesthesia care and Propofol 200 mg IV  DESCRIPTION OF PROCEDURE:   After the risks benefits and alternatives of the procedure were thoroughly explained, informed consent was obtained.  The digital rectal exam revealed no abnormalities of the rectum.   The LB [id number]  endoscope was introduced through the anus and advanced to the cecum, which was identified by both the appendix and ileocecal valve. No adverse events experienced.   The quality of the prep was excellent.  The instrument was then slowly withdrawn as the colon was fully examined. Estimated blood loss is zero unless otherwise noted in this procedure report.  COLON FINDINGS: A sessile polyp measuring 3 mm in size was found in the ascending colon.  A polypectomy was performed with a cold snare.  The resection was complete, the polyp tissue was completely retrieved and sent to histology.   There was mild diverticulosis noted in the left colon.   Moderate sized internal hemorrhoids were found.   The examination was otherwise normal.  Retroflexed views revealed no abnormalities. The time to cecum = 2.4 Withdrawal time = 8.9   The scope was  withdrawn and the procedure completed. COMPLICATIONS: There were no immediate complications.  ENDOSCOPIC IMPRESSION: 1.   Sessile polyp was found in the ascending colon; polypectomy was performed with a cold snare 2.   Mild diverticulosis was noted in the left colon 3.   Medium sized internal hemorrhoids 4.   The examination was otherwise normal  RECOMMENDATIONS: 1. If the polyp(s) removed today are proven to be adenomatous (pre-cancerous) polyps, you will need a repeat colonoscopy in 5 years.  Otherwise you should continue to follow colorectal cancer screening guidelines for "routine risk" patients with colonoscopy in 10 years.  You will receive a letter within 1-2 weeks with the results of your biopsy as well as final recommendations.  Please call my office if you have not received a letter after 3 weeks. 2. My office will arrange referral to meet with Dr. Carlean Purl to discuss, consider hemorrhoidal banding of your medium sized internal hemorrhoids.  eSigned:  Milus Banister, MD 05/01/2015 2:34 PM   cc: Walker Kehr, MD   PATIENT NAME:  Russell Griffith, Russell Griffith MR#: [id number]

## 2015-05-02 ENCOUNTER — Telehealth: Payer: Self-pay | Admitting: *Deleted

## 2015-05-02 NOTE — Telephone Encounter (Signed)
  Follow up Call-  Call back number 05/01/2015 11/01/2012  Post procedure Call Back phone  # [phone] has 7:30 meeting ok to speak with wife  Permission to leave phone message Yes Yes     Patient questions:  Do you have a fever, pain , or abdominal swelling? No. Pain Score  0 *  Have you tolerated food without any problems? Yes.    Have you been able to return to your normal activities? Yes.    Do you have any questions about your discharge instructions: Diet   No. Medications  No. Follow up visit  No.  Do you have questions or concerns about your Care? No.  Actions: * If pain score is 4 or above: No action needed, pain <4.

## 2015-05-03 NOTE — Telephone Encounter (Signed)
Appt with Dr Carlean Purl for 06/24/15 1045 am letter mailed

## 2015-05-06 ENCOUNTER — Encounter: Payer: Self-pay | Admitting: Gastroenterology

## 2015-05-08 ENCOUNTER — Encounter: Payer: Self-pay | Admitting: Sports Medicine

## 2015-05-10 ENCOUNTER — Other Ambulatory Visit: Payer: Self-pay | Admitting: Sports Medicine

## 2015-05-10 ENCOUNTER — Other Ambulatory Visit: Payer: Self-pay | Admitting: *Deleted

## 2015-05-10 DIAGNOSIS — M25511 Pain in right shoulder: Secondary | ICD-10-CM

## 2015-05-10 NOTE — Addendum Note (Signed)
Addended by: Cyd Silence on: 05/10/2015 10:47 AM   Modules accepted: Orders

## 2015-05-13 ENCOUNTER — Encounter: Payer: Self-pay | Admitting: *Deleted

## 2015-05-13 ENCOUNTER — Other Ambulatory Visit: Payer: Self-pay | Admitting: Cardiology

## 2015-05-13 DIAGNOSIS — I6523 Occlusion and stenosis of bilateral carotid arteries: Secondary | ICD-10-CM

## 2015-05-13 NOTE — Patient Instructions (Signed)
Prior auth approval confirmation [id number] for the CT Arthrogram of his R Shoulder

## 2015-05-22 ENCOUNTER — Ambulatory Visit (HOSPITAL_COMMUNITY): Payer: PPO | Attending: Cardiology

## 2015-05-22 DIAGNOSIS — Z951 Presence of aortocoronary bypass graft: Secondary | ICD-10-CM | POA: Insufficient documentation

## 2015-05-22 DIAGNOSIS — I2510 Atherosclerotic heart disease of native coronary artery without angina pectoris: Secondary | ICD-10-CM | POA: Diagnosis not present

## 2015-05-22 DIAGNOSIS — I6523 Occlusion and stenosis of bilateral carotid arteries: Secondary | ICD-10-CM | POA: Diagnosis present

## 2015-05-22 DIAGNOSIS — E785 Hyperlipidemia, unspecified: Secondary | ICD-10-CM | POA: Insufficient documentation

## 2015-05-22 DIAGNOSIS — I10 Essential (primary) hypertension: Secondary | ICD-10-CM | POA: Diagnosis not present

## 2015-05-27 ENCOUNTER — Other Ambulatory Visit: Payer: Self-pay

## 2015-05-28 ENCOUNTER — Ambulatory Visit
Admission: RE | Admit: 2015-05-28 | Discharge: 2015-05-28 | Disposition: A | Discharge status: Home / Self Care | Payer: PPO | Source: Ambulatory Visit | Attending: Sports Medicine | Admitting: Sports Medicine

## 2015-05-28 DIAGNOSIS — M25511 Pain in right shoulder: Secondary | ICD-10-CM

## 2015-05-28 MED ORDER — IOHEXOL 180 MG/ML  SOLN
15.0000 mL | Freq: Once | INTRAMUSCULAR | Status: AC | PRN
  Administered 2015-05-28: 15 mL via INTRA_ARTICULAR

## 2015-05-31 ENCOUNTER — Telehealth: Payer: Self-pay | Admitting: Sports Medicine

## 2015-05-31 ENCOUNTER — Encounter: Payer: Self-pay | Admitting: *Deleted

## 2015-05-31 NOTE — Telephone Encounter (Signed)
I spoke with the patient on the phone today after reviewing the CT arthrogram of his shoulder. 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 This involves about 50% of the tendon. He also has a longitudinal tear through the subscapularis. Also seen is some inferior spurring at the [facility] joint. I have asked the patient to discuss these findings with Dr. Joni Fears whom he knows on a personal level. Patient will likely benefit from a rotator cuff debridement and DCE. Patient will follow-up with me when necessary.

## 2015-06-17 ENCOUNTER — Telehealth: Payer: Self-pay | Admitting: Cardiology

## 2015-06-17 NOTE — Telephone Encounter (Signed)
New message     Pt returning Lynn's call.  Pt also has questions regarding pacer check.   Please call to discuss.

## 2015-06-17 NOTE — Telephone Encounter (Signed)
Pt states he is returning a call from [place] from last week. Pt advised I do not see recent call to him from [place], pt states he received letter with carotid results from June 2016. Pt advised I will forward to [place] to follow up with him tomorrow.  Pt transferred by phone to [facility] to help him with transmission information.

## 2015-06-18 NOTE — Telephone Encounter (Signed)
The pt is advised that the reason I called and left him several messages was concerning his Carotid Duplex results and that I mailed him a letter as well. The pt verbalized understanding and thanked me for calling him back.

## 2015-06-18 NOTE — Telephone Encounter (Signed)
**Note De-identified Bayli Quesinberry Obfuscation** LMTCB

## 2015-06-24 ENCOUNTER — Encounter: Payer: Self-pay | Admitting: Internal Medicine

## 2015-06-24 ENCOUNTER — Ambulatory Visit: Payer: PPO | Admitting: Internal Medicine

## 2015-06-28 ENCOUNTER — Encounter: Payer: Self-pay | Admitting: Internal Medicine

## 2015-06-28 ENCOUNTER — Ambulatory Visit (INDEPENDENT_AMBULATORY_CARE_PROVIDER_SITE_OTHER): Payer: PPO | Admitting: Internal Medicine

## 2015-06-28 ENCOUNTER — Encounter: Payer: Self-pay | Admitting: Cardiology

## 2015-06-28 VITALS — BP 124/72 | HR 74 | Ht 71.0 in | Wt 194.8 lb

## 2015-06-28 DIAGNOSIS — I480 Paroxysmal atrial fibrillation: Secondary | ICD-10-CM

## 2015-06-28 DIAGNOSIS — Z950 Presence of cardiac pacemaker: Secondary | ICD-10-CM

## 2015-06-28 LAB — CUP PACEART INCLINIC DEVICE CHECK
Brady Statistic RA Percent Paced: 80 %
Lead Channel Impedance Value: 506 Ohm
Lead Channel Pacing Threshold Amplitude: 0.6 V
Lead Channel Setting Pacing Amplitude: 2 V
Lead Channel Setting Pacing Amplitude: 2.4 V
Lead Channel Setting Pacing Pulse Width: 0.7 ms
Lead Channel Setting Sensing Sensitivity: 2.5 mV
MDC IDC MSMT LEADCHNL RA IMPEDANCE VALUE: 616 Ohm
MDC IDC MSMT LEADCHNL RA PACING THRESHOLD PULSEWIDTH: 0.4 ms
MDC IDC MSMT LEADCHNL RA SENSING INTR AMPL: 2.7 mV
MDC IDC MSMT LEADCHNL RV PACING THRESHOLD AMPLITUDE: 1.2 V
MDC IDC MSMT LEADCHNL RV PACING THRESHOLD PULSEWIDTH: 0.7 ms
MDC IDC MSMT LEADCHNL RV SENSING INTR AMPL: 25 mV — AB
MDC IDC PG SERIAL: 111255
MDC IDC SESS DTM: 20160729040000
MDC IDC STAT BRADY RV PERCENT PACED: 8 %
Zone Setting Detection Interval: 375 ms

## 2015-06-28 NOTE — Progress Notes (Signed)
Russell Griffith is aware that I am  retiring at the end of September, 2016. He and I have decided that his new cardiologist will be [facility]. The office will be contacting him for an appointment with [name] probably in November.  [name]

## 2015-06-28 NOTE — Progress Notes (Signed)
Patient Care Team: Cassandria Anger, MD as PCP - General (Internal Medicine) Carlena Bjornstad, MD (Cardiology) Stefanie Libel, MD (Family Medicine) Deboraha Sprang, MD (Cardiology) Jarome Matin, MD as Consulting Physician (Dermatology)   HPI  Russell Griffith is a 69 y.o. male Seen in followup for atrial fibrillation for which she underwent pulmonary vein isolation at [facility]. He is also status post pacemaker implantation for significant pausing in the context of intermittent atrial fibrillation and also significant sinus node dysfunction. this was addressed by activation rate response of his pacemaker April 2014  Is a history of coronary artery disease with bypass surgery 2000 and a Myoview November 2013 demonstrating no ischemia or scar. LV function was normal by echo February 2014  He has done well.  He also notes that with exercise his peak heart rate is about 115 about how far he tries. He is satisfied with this.  Past Medical History  Diagnosis Date  . CAD (coronary artery disease)     Myoview March, 2011, excellent exercise, hypertensive response, no scar or ischemia, EF 61%, mild palpitations peak stress with infrequent PACs and PVCs.  Marland Wieting Hyperlipidemia     Low HDL  . GERD (gastroesophageal reflux disease)     Barrett's esophagus  . Colonic polyp   . Palpitations      exercise palpitations, probably A. fib-not VT,Pindolol not tolerated, CAD, bradycardia,Tickosyn needs hospital +/_ side effects, amiodarone & sotalol could need pacer,Multaq less effective not tried, atrial fib ablation could be considered at low risk  . Foot pain     Chronic lateral foot pain-resolved with use of orthotics '09  . Aortic stenosis     Mild, echo, April, 2012  . Carotid artery disease     Doppler November, 2010, 0-39% bilateral followup 2 years  . Warfarin anticoagulation     Coumadin for atrial fib  . Aortic insufficiency      Mild, echo, 2010 / mild/moderate, echo, 2012  . Elevated bilirubin      Mild chronic elevation, 2.0 January, 2011 stable  . BPH (benign prostatic hyperplasia)   . Drug intolerance     Pindolol before exercise not tolerated  . Ejection fraction     EF 60-65%, echo, September, 2010 / EF 60-65% echo, April, 2012  . Hypokalemia     Potassium 3.6 before potassium started April, 2012  . Hx of colonoscopy   . Chronic foot pain 2009    Resolved with use of orthotics  . Intolerance of drug     Fish oil, bilirubin, nacin  . BPH (benign prostatic hyperplasia)   . Pulmonic stenosis     Mild, echo, April, 2012  . TSH (thyroid-stimulating hormone deficiency)   . Hemorrhoids     September, 2012  . Numbness of toes     Slight numbness of one toe, September, 2012  . Lung granuloma     Left  lung chest x-ray July, 2013  . A-fib      Admission for Tikosyn load, March 20, 2011, converted after first dose, QTC 440 at the time of discharge. / Office note March 27, 2011, clinically in and out of atrial, QTC 500 ms, Dr.Klein decreased Tikosyn to 375mg  BID.  Atrial fib at rest February 25, 2011 / plan to admit for [facility] therapy  . PVC's (premature ventricular contractions)   . Atrial flutter     Status post ablation  . HTN (hypertension)     Controlled at rest, hypertension on treadmill, sensitive  to medications  . Shortness of breath 07/15/2012    "can happen at any time; related to AF and bradycardia"  . Barrett's esophagus   . Migraines 07/15/2012    "years ago; before glasses changed"  . Primary osteoarthritis of left knee     Mild  . Mitral regurgitation   . Tricuspid regurgitation   . HX: anticoagulation   . Diverticulosis   . Tubular adenoma of colon     Past Surgical History  Procedure Laterality Date  . Insert / replace / remove pacemaker  07/15/2012    initial placement  . Tonsillectomy  1956  . Coronary artery bypass graft  2000    CABG X5  . Radiofrequency  ablation for heart arrythmia    . Permanent pacemaker insertion N/A 07/15/2012    Procedure:  PERMANENT PACEMAKER INSERTION;  Surgeon: Deboraha Sprang, MD;  Location: [facility] CATH LAB;  Service: Cardiovascular;  Laterality: N/A;  . Colonoscopy      Current Outpatient Prescriptions  Medication Sig Dispense Refill  . aspirin 81 MG tablet Take 81 mg by mouth daily.    Marland Ehrlich atorvastatin (LIPITOR) 20 MG tablet Take 20 mg by mouth daily.    Marland Nardone desonide (DESOWEN) 0.05 % cream Apply 1 application topically daily as needed (skin isses).     . Multiple Vitamin (MULTIVITAMIN WITH MINERALS) TABS Take 1 tablet by mouth daily.    . pantoprazole (PROTONIX) 40 MG tablet Take 1 tablet (40 mg total) by mouth daily. 90 tablet 3  . ramipril (ALTACE) 2.5 MG capsule Take 2.5 mg by mouth daily.     No current facility-administered medications for this visit.    Allergies  Allergen Reactions  . Sulfonamide Derivatives Other (See Comments)    "have no idea; mother told me I was allergic to"    Review of Systems negative except from HPI and PMH  Physical Exam BP 124/72 mmHg  Pulse 74  Ht 5\' 11"  (1.803 m)  Wt 194 lb 12.8 oz (88.361 kg)  BMI 27.18 kg/m2 Well developed and nourished in no acute distress HENT normal Neck supple with JVP-flat Clear Regular rate and rhythm, no murmurs or gallops Abd-soft with active BS No Clubbing cyanosis edema Skin-warm and dry A & Oriented  Grossly normal sensory and motor function  ECG demonstrates atrial pacing at 55 Intervals 21/09/43  Assessment and  Plan  Atrial fibrillation s/p ablation  PACs  Sinus node dysfunction  Ischemic heart disease with prior bypass surgery  Pacemaker  [employer]    The patient is having PACs and rates moving resulting in a in his ventricular pacing percentage having gone from 6%--45%. I reviewed this with the patient. No further interventions will be done at this time we will follow this along.  Without symptoms of ischemia  We spent more than 50% of our >25 min visit in face to face counseling regarding the above

## 2015-06-28 NOTE — Patient Instructions (Signed)
Medication Instructions:  Your physician recommends that you continue on your current medications as directed. Please refer to the Current Medication list given to you today.   Labwork: NONE  Testing/Procedures: NOnE  Follow-Up: Your physician wants you to follow-up in: 12 months. You will receive a reminder letter in the mail two months in advance. If you don't receive a letter, please call our office to schedule the follow-up appointment.  Remote monitoring is used to monitor your Pacemaker or ICD from home. This monitoring reduces the number of office visits required to check your device to one time per year. It allows [place] to keep an eye on the functioning of your device to ensure it is working properly. You are scheduled for a device check from home on 09/30/2015. You may send your transmission at any time that day. If you have a wireless device, the transmission will be sent automatically. After your physician reviews your transmission, you will receive a postcard with your next transmission date.    Any Other Special Instructions Will Be Listed Below (If Applicable).

## 2015-07-08 ENCOUNTER — Telehealth: Payer: Self-pay | Admitting: Cardiology

## 2015-07-08 NOTE — Telephone Encounter (Signed)
Closed encounter °

## 2015-07-23 ENCOUNTER — Encounter: Payer: Self-pay | Admitting: Internal Medicine

## 2015-07-23 ENCOUNTER — Ambulatory Visit (INDEPENDENT_AMBULATORY_CARE_PROVIDER_SITE_OTHER): Payer: PPO | Admitting: Internal Medicine

## 2015-07-23 VITALS — BP 130/80 | HR 71 | Ht 71.0 in | Wt 203.0 lb

## 2015-07-23 DIAGNOSIS — M7630 Iliotibial band syndrome, unspecified leg: Secondary | ICD-10-CM | POA: Insufficient documentation

## 2015-07-23 DIAGNOSIS — Z23 Encounter for immunization: Secondary | ICD-10-CM

## 2015-07-23 DIAGNOSIS — M7632 Iliotibial band syndrome, left leg: Secondary | ICD-10-CM | POA: Diagnosis not present

## 2015-07-23 DIAGNOSIS — Z0000 Encounter for general adult medical examination without abnormal findings: Secondary | ICD-10-CM | POA: Diagnosis not present

## 2015-07-23 DIAGNOSIS — I480 Paroxysmal atrial fibrillation: Secondary | ICD-10-CM

## 2015-07-23 MED ORDER — MELOXICAM 7.5 MG PO TABS: 7.5000 mg | ORAL_TABLET | Freq: Every day | ORAL | Status: DC | PRN

## 2015-07-23 MED ORDER — VITAMIN D 1000 UNITS PO TABS: 1000.0000 [IU] | ORAL_TABLET | Freq: Every day | ORAL | Status: AC

## 2015-07-23 MED ORDER — SILDENAFIL CITRATE 100 MG PO TABS: 100.0000 mg | ORAL_TABLET | ORAL | Status: DC | PRN

## 2015-07-23 NOTE — Progress Notes (Signed)
Pre visit review using our clinic review tool, if applicable. No additional management support is needed unless otherwise documented below in the visit note. 

## 2015-07-23 NOTE — Progress Notes (Signed)
Subjective:  Patient ID: [name] ED [place]hour, male    DOB: 1946/10/26  Age: 69 y.o. MRN: [id number]  CC: Annual Exam   HPI   Londyn ED Danzer presents for a well exam. C/o L lateral leg pain hip to below the knee x weeks: worse at night. No stiffness. No h/o injury  Outpatient Prescriptions Prior to Visit  Medication Sig Dispense Refill  . aspirin 81 MG tablet Take 81 mg by mouth daily.    Marland Florido atorvastatin (LIPITOR) 20 MG tablet Take 20 mg by mouth daily.    Marland Zamor desonide (DESOWEN) 0.05 % cream Apply 1 application topically daily as needed (skin isses).     . Multiple Vitamin (MULTIVITAMIN WITH MINERALS) TABS Take 1 tablet by mouth daily.    . pantoprazole (PROTONIX) 40 MG tablet Take 1 tablet (40 mg total) by mouth daily. 90 tablet 3  . ramipril (ALTACE) 2.5 MG capsule Take 2.5 mg by mouth daily.     No facility-administered medications prior to visit.    ROS Review of Systems  Constitutional: Negative for appetite change, fatigue and unexpected weight change.  HENT: Negative for congestion, ear discharge, nosebleeds, sneezing, sore throat, trouble swallowing and voice change.   Eyes: Negative for itching and visual disturbance.  Respiratory: Negative for cough, chest tightness and wheezing.   Cardiovascular: Negative for chest pain, palpitations and leg swelling.  Gastrointestinal: Negative for nausea, diarrhea, blood in stool and abdominal distention.  Genitourinary: Negative for frequency and hematuria.  Musculoskeletal: Positive for arthralgias. Negative for back pain, joint swelling, gait problem and neck pain.  Skin: Negative for color change, pallor, rash and wound.  Neurological: Negative for dizziness, tremors, speech difficulty, weakness, light-headedness and numbness.  Psychiatric/Behavioral: Negative for suicidal ideas, sleep disturbance, dysphoric mood, decreased concentration and agitation. The patient is not nervous/anxious.     Objective:  BP 130/80 mmHg  Pulse 71   Ht 5\' 11"  (1.803 m)  Wt 203 lb (92.08 kg)  BMI 28.33 kg/m2  SpO2 95%  BP Readings from Last 3 Encounters:  07/23/15 130/80  06/28/15 124/72  05/01/15 161/83    Wt Readings from Last 3 Encounters:  07/23/15 203 lb (92.08 kg)  06/28/15 194 lb 12.8 oz (88.361 kg)  05/01/15 200 lb (90.719 kg)    Physical Exam  Constitutional: He is oriented to person, place, and time. He appears well-developed. No distress.  NAD  HENT:  Mouth/Throat: Oropharynx is clear and moist.  Eyes: Conjunctivae are normal. Pupils are equal, round, and reactive to light.  Neck: Normal range of motion. No JVD present. No thyromegaly present.  Cardiovascular: Normal rate, regular rhythm, normal heart sounds and intact distal pulses.  Exam reveals no gallop and no friction rub.   No murmur heard. Pulmonary/Chest: Effort normal and breath sounds normal. No respiratory distress. He has no wheezes. He has no rales. He exhibits no tenderness.  Abdominal: Soft. Bowel sounds are normal. He exhibits no distension and no mass. There is no tenderness. There is no rebound and no guarding.  Musculoskeletal: Normal range of motion. He exhibits tenderness. He exhibits no edema.  Lymphadenopathy:    He has no cervical adenopathy.  Neurological: He is alert and oriented to person, place, and time. He has normal reflexes. No cranial nerve deficit. He exhibits normal muscle tone. He displays a negative Romberg sign. Coordination and gait normal.  Skin: Skin is warm and dry. No rash noted.  Psychiatric: He has a normal mood and affect. His behavior is  normal. Judgment and thought content normal.  L IT band is sensitive Rectal - per GI at the time of colonoscopy - 05/08/15  Lab Results  Component Value Date   WBC 5.6 07/03/2014   HGB 15.4 07/03/2014   HCT 45.1 07/03/2014   PLT 172.0 07/03/2014   GLUCOSE 106* 07/03/2014   CHOL 115 03/08/2015   TRIG 54.0 03/08/2015   HDL 34.60* 03/08/2015   LDLCALC 70 03/08/2015   ALT 26  04/15/2015   AST 26 04/15/2015   NA 140 07/03/2014   K 4.3 07/03/2014   CL 106 07/03/2014   CREATININE 1.0 07/03/2014   BUN 20 07/03/2014   CO2 28 07/03/2014   TSH 4.13 09/04/2014   PSA 1.22 07/03/2014   INR 1.53* 07/15/2012   HGBA1C 6.0 09/04/2014    Ct Shoulder Right W Contrast  05/28/2015   CLINICAL DATA:  Progressive right shoulder pain for 6-8 months.  EXAM: CT ARTHROGRAPHY OF THE RIGHT SHOULDER  TECHNIQUE: Multidetector CT imaging was performed following the standard protocol after injection of dilute contrast into the joint.  COMPARISON:  Radiographs dated 12/03/2014  FINDINGS: Rotator cuff: There is a partial thickness linear articular surface tear of the of the distal infraspinatus tendon. The tear is approximately 2 cm in width and 16 mm in length. It extends approximately 50% of the way through the tendon. The contact with the articular surfaces at the insertion on the greater tuberosity.  There is also a longitudinal articular surface split tear of the subscapularis tendon best seen on images 13 through 20 of series 603.  Muscles:  No atrophy.  There is contrast extending into the distal fibers of the teres minor muscle but the tendinous attachment is intact. I suspect this is a benign finding.  Labrum:  Normal.  Long head of the biceps tendon:  Properly located and intact.  AC joint: Type 2 acromion. Moderate arthritis of the [facility] joint with calcification in the joint capsule. Inferior osteophyte on the distal clavicle could predispose to impingement.  Bones: AC joint arthropathy. The other osseous structures are normal.  IMPRESSION: 1. Articular surface partial thickness tear of the distal infraspinatus tendon. 2. Longitudinal articular surface split tear of the subscapularis tendon. 3. 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   Electronically Signed   By: Lorriane Shire M.D.   On: 05/28/2015 17:39   Dg Fluoro Guide  Ndl Plcd/bx/inj/loc  05/28/2015   CLINICAL DATA:  RIGHT shoulder pain.  FLUOROSCOPY TIME:  18 seconds.  One spot film.  PROCEDURE: RIGHT SHOULDER INJECTION UNDER FLUOROSCOPY  Informed written consent was obtained.  Time-out was performed.  An appropriate skin entrance site was determined. The site was marked, prepped with Betadine, draped in the usual sterile fashion, and infiltrated locally with 1% Lidocaine. 22 gauge spinal needle was advanced to the superomedial margin of the humeral head under intermittent fluoroscopy. 1 ml of Lidocaine injected easily. A mixture of 15 mL of dilute Omnipaque 180 mixed with 1% lidocaine was then used to opacify the RIGHT shoulder capsule. No immediate complication.  IMPRESSION: Technically successful RIGHT shoulder injection for MRI.   Electronically Signed   By: Staci Righter M.D.   On: 05/28/2015 16:08    Assessment & Plan:   Talbert was seen today for annual exam.  Diagnoses and all orders for this visit:  IT band syndrome, left -     Vitamin B12; Future -  TSH; Future -     T4, free; Future -     PSA; Future -     Lipid panel; Future -     Basic metabolic panel; Future -     CBC with Differential/Platelet; Future -     Hemoglobin A1c; Future -     Hepatic function panel; Future -     Vit D  25 hydroxy (rtn osteoporosis monitoring); Future  Well adult exam -     Vitamin B12; Future -     TSH; Future -     T4, free; Future -     PSA; Future -     Lipid panel; Future -     Basic metabolic panel; Future -     CBC with Differential/Platelet; Future -     Hemoglobin A1c; Future -     Hepatic function panel; Future -     Vit D  25 hydroxy (rtn osteoporosis monitoring); Future  Need for prophylactic vaccination against Streptococcus pneumoniae (pneumococcus) -     Pneumococcal polysaccharide vaccine 23-valent greater than or equal to 2yo subcutaneous/IM  Other orders -     cholecalciferol (VITAMIN D) 1000 UNITS tablet; Take 1 tablet (1,000 Units  total) by mouth daily. -     sildenafil (VIAGRA) 100 MG tablet; Take 1 tablet (100 mg total) by mouth as needed for erectile dysfunction. -     meloxicam (MOBIC) 7.5 MG tablet; Take 1-2 tablets (7.5-15 mg total) by mouth daily as needed for pain.   I am having Mr. Musick start on cholecalciferol, sildenafil, and meloxicam. I am also having him maintain his multivitamin with minerals, aspirin, desonide, pantoprazole, atorvastatin, and ramipril.  Meds ordered this encounter  Medications  . cholecalciferol (VITAMIN D) 1000 UNITS tablet    Sig: Take 1 tablet (1,000 Units total) by mouth daily.    Dispense:  100 tablet    Refill:  3  . sildenafil (VIAGRA) 100 MG tablet    Sig: Take 1 tablet (100 mg total) by mouth as needed for erectile dysfunction.    Dispense:  12 tablet    Refill:  11  . meloxicam (MOBIC) 7.5 MG tablet    Sig: Take 1-2 tablets (7.5-15 mg total) by mouth daily as needed for pain.    Dispense:  60 tablet    Refill:  3     Follow-up: Return in about 1 year (around 07/22/2016) for Wellness Exam.  Walker Kehr, MD

## 2015-07-23 NOTE — Patient Instructions (Signed)
IT band stretch

## 2015-07-23 NOTE — Assessment & Plan Note (Signed)
We discussed age appropriate health related issues, including available/recomended screening tests and vaccinations. We discussed a need for adhering to healthy diet and exercise. Labs/EKG were reviewed/ordered. All questions were answered.   

## 2015-07-24 NOTE — Assessment & Plan Note (Signed)
Doing well 

## 2015-07-26 ENCOUNTER — Encounter: Payer: Self-pay | Admitting: Internal Medicine

## 2015-07-30 ENCOUNTER — Other Ambulatory Visit (INDEPENDENT_AMBULATORY_CARE_PROVIDER_SITE_OTHER): Payer: PPO

## 2015-07-30 DIAGNOSIS — M7632 Iliotibial band syndrome, left leg: Secondary | ICD-10-CM | POA: Diagnosis not present

## 2015-07-30 DIAGNOSIS — Z0000 Encounter for general adult medical examination without abnormal findings: Secondary | ICD-10-CM | POA: Diagnosis not present

## 2015-07-30 LAB — CBC WITH DIFFERENTIAL/PLATELET
BASOS ABS: 0 10*3/uL (ref 0.0–0.1)
Basophils Relative: 0.5 % (ref 0.0–3.0)
Eosinophils Absolute: 0.1 10*3/uL (ref 0.0–0.7)
Eosinophils Relative: 1.5 % (ref 0.0–5.0)
HCT: 44.3 % (ref 39.0–52.0)
Hemoglobin: 15.2 g/dL (ref 13.0–17.0)
LYMPHS ABS: 1.7 10*3/uL (ref 0.7–4.0)
Lymphocytes Relative: 28.5 % (ref 12.0–46.0)
MCHC: 34.3 g/dL (ref 30.0–36.0)
MCV: 88.6 fl (ref 78.0–100.0)
MONO ABS: 0.9 10*3/uL (ref 0.1–1.0)
Monocytes Relative: 14.6 % — ABNORMAL HIGH (ref 3.0–12.0)
NEUTROS PCT: 54.9 % (ref 43.0–77.0)
Neutro Abs: 3.2 10*3/uL (ref 1.4–7.7)
Platelets: 186 10*3/uL (ref 150.0–400.0)
RBC: 5 Mil/uL (ref 4.22–5.81)
RDW: 13.9 % (ref 11.5–15.5)
WBC: 5.9 10*3/uL (ref 4.0–10.5)

## 2015-07-30 LAB — LIPID PANEL
Cholesterol: 114 mg/dL (ref 0–200)
HDL: 28.9 mg/dL — ABNORMAL LOW (ref >39.00)
LDL CALC: 70 mg/dL (ref 0–99)
NonHDL: 85.05
TRIGLYCERIDES: 73 mg/dL (ref 0.0–149.0)
Total CHOL/HDL Ratio: 4
VLDL: 14.6 mg/dL (ref 0.0–40.0)

## 2015-07-30 LAB — BASIC METABOLIC PANEL
BUN: 18 mg/dL (ref 6–23)
CO2: 29 mEq/L (ref 19–32)
Calcium: 9.6 mg/dL (ref 8.4–10.5)
Chloride: 106 mEq/L (ref 96–112)
Creatinine, Ser: 1.06 mg/dL (ref 0.40–1.50)
GFR: 73.94 mL/min (ref >60.00)
GLUCOSE: 105 mg/dL — AB (ref 70–99)
Potassium: 4.1 mEq/L (ref 3.5–5.1)
SODIUM: 141 meq/L (ref 135–145)

## 2015-07-30 LAB — HEPATIC FUNCTION PANEL
ALBUMIN: 4.2 g/dL (ref 3.5–5.2)
ALT: 22 U/L (ref 0–53)
AST: 22 U/L (ref 0–37)
Alkaline Phosphatase: 78 U/L (ref 39–117)
BILIRUBIN DIRECT: 0.4 mg/dL — AB (ref 0.0–0.3)
BILIRUBIN TOTAL: 2 mg/dL — AB (ref 0.2–1.2)
Total Protein: 6.5 g/dL (ref 6.0–8.3)

## 2015-07-30 LAB — VITAMIN B12: VITAMIN B 12: 600 pg/mL (ref 211–911)

## 2015-07-30 LAB — PSA: PSA: 0.94 ng/mL (ref 0.10–4.00)

## 2015-07-30 LAB — VITAMIN D 25 HYDROXY (VIT D DEFICIENCY, FRACTURES): VITD: 32.99 ng/mL (ref 30.00–100.00)

## 2015-07-30 LAB — HEMOGLOBIN A1C: HEMOGLOBIN A1C: 5.8 % (ref 4.6–6.5)

## 2015-07-30 LAB — T4, FREE: Free T4: 1.01 ng/dL (ref 0.60–1.60)

## 2015-07-30 LAB — TSH: TSH: 6.2 u[IU]/mL — AB (ref 0.35–4.50)

## 2015-07-31 ENCOUNTER — Encounter: Payer: Self-pay | Admitting: Internal Medicine

## 2015-08-12 ENCOUNTER — Encounter: Payer: Self-pay | Admitting: Internal Medicine

## 2015-08-12 ENCOUNTER — Ambulatory Visit (INDEPENDENT_AMBULATORY_CARE_PROVIDER_SITE_OTHER): Payer: PPO | Admitting: Internal Medicine

## 2015-08-12 VITALS — BP 134/70 | HR 72 | Ht 69.5 in | Wt 201.5 lb

## 2015-08-12 DIAGNOSIS — K642 Third degree hemorrhoids: Secondary | ICD-10-CM | POA: Insufficient documentation

## 2015-08-12 DIAGNOSIS — K648 Other hemorrhoids: Secondary | ICD-10-CM

## 2015-08-12 HISTORY — DX: Third degree hemorrhoids: K64.2

## 2015-08-12 NOTE — Assessment & Plan Note (Signed)
Hx c/w Gr 3 prolapse Reviewed banding risks and benefits He will return early Oct - plan anoscopy and banding then

## 2015-08-12 NOTE — Patient Instructions (Addendum)
  We will see you back for banding Oct. 4th at 3:15pm.    I appreciate the opportunity to care for you. Silvano Rusk, MD, [facility]

## 2015-08-12 NOTE — Progress Notes (Signed)
   Subjective:    Patient ID: Russell Griffith, male    DOB: 1946/11/11, 69 y.o.   MRN: [id number] Cc: hemorrhoids HPI  69 yo wm w/ sxs of prolapse - manual reduction on an intermitteent basis. Some straining to stool. > 3 mins on commode (reads) 7 daily stools  Sxs intermittent - ok at present  Had colonoscopy this summer Russell Griffith) and int hemorrhoids seen Medications, allergies, past medical history, past surgical history, family history and social history are reviewed and updated in the EMR.  Review of Systems As above    Objective:   Physical Exam BP 134/70 mmHg  Pulse 72  Ht 5' 9.5" (1.765 m)  Wt 201 lb 8 oz (91.4 kg)  BMI 29.34 kg/m2    Assessment & Plan:  Prolapsed internal hemorrhoids, grade 3 Hx c/w Gr 3 prolapse Reviewed banding risks and benefits He will return early Oct - plan anoscopy and banding then

## 2015-08-15 ENCOUNTER — Encounter: Payer: Self-pay | Admitting: Gastroenterology

## 2015-08-16 ENCOUNTER — Encounter: Payer: Self-pay | Admitting: Gastroenterology

## 2015-09-03 ENCOUNTER — Telehealth: Payer: Self-pay | Admitting: Internal Medicine

## 2015-09-03 ENCOUNTER — Encounter: Payer: PPO | Admitting: Internal Medicine

## 2015-09-03 NOTE — Telephone Encounter (Signed)
No charge per Dr. Gessner. 

## 2015-09-29 ENCOUNTER — Encounter: Payer: Self-pay | Admitting: Internal Medicine

## 2015-09-30 ENCOUNTER — Ambulatory Visit (INDEPENDENT_AMBULATORY_CARE_PROVIDER_SITE_OTHER): Payer: PPO | Admitting: *Deleted

## 2015-09-30 DIAGNOSIS — I495 Sick sinus syndrome: Secondary | ICD-10-CM

## 2015-10-01 ENCOUNTER — Telehealth: Payer: Self-pay | Admitting: *Deleted

## 2015-10-01 DIAGNOSIS — I4892 Unspecified atrial flutter: Secondary | ICD-10-CM

## 2015-10-01 MED ORDER — APIXABAN 5 MG PO TABS: 5.0000 mg | ORAL_TABLET | Freq: Two times a day (BID) | ORAL | Status: DC

## 2015-10-01 NOTE — Telephone Encounter (Addendum)
Reviewed patient's Latitude transmission with Dr. Caryl Comes (see patient email for additional details of symptoms).  Per Dr. Caryl Comes, patient has been having atrial flutter and atrial fib episodes, history of ablation per notes.  Dr. Caryl Comes called patient to discuss symptoms and risks/benefits of anticoagulation.  Per Dr. Caryl Comes, patient's CHADS VASC score is 3.  Patient states that he has been feeling better recently, especially when exercising, despite the onset of atrial arrhythmias.  He is agreeable to starting Eliquis 5mg  PO BID and discontinuing his ASA 81mg .  Written prescription and trial offer card will be placed at front desk for patient.  Patient to follow-up with Dr. Percival Spanish at appointment on 10/10/15 at 11:00am.  Patient voices understanding of all instructions, and states that he will stop by the office in the "next day or two" to pick up the prescription and the trial offer card.  Patient denies any additional questions or concerns at this time.

## 2015-10-01 NOTE — Progress Notes (Signed)
Remote pacemaker transmission.   

## 2015-10-02 NOTE — Telephone Encounter (Signed)
Written prescription and trial offer card placed at check-in/front desk for patient to pick up.

## 2015-10-03 LAB — CUP PACEART REMOTE DEVICE CHECK
Battery Remaining Longevity: 126 mo
Battery Remaining Percentage: 100 %
Brady Statistic RV Percent Paced: 21 %
Date Time Interrogation Session: 20161031053000
Implantable Lead Implant Date: 20130816
Implantable Lead Implant Date: 20130816
Implantable Lead Location: 753859
Implantable Lead Model: 5076
Lead Channel Impedance Value: 487 Ohm
Lead Channel Setting Pacing Amplitude: 2 V
Lead Channel Setting Pacing Amplitude: 2.4 V
Lead Channel Setting Pacing Pulse Width: 0.7 ms
Lead Channel Setting Sensing Sensitivity: 2.5 mV
MDC IDC LEAD LOCATION: 753860
MDC IDC MSMT LEADCHNL RA IMPEDANCE VALUE: 573 Ohm
MDC IDC MSMT LEADCHNL RA SENSING INTR AMPL: 2.6 mV
MDC IDC MSMT LEADCHNL RV SENSING INTR AMPL: 25 mV — AB
MDC IDC STAT BRADY RA PERCENT PACED: 59 %
Pulse Gen Serial Number: 111255

## 2015-10-04 ENCOUNTER — Encounter: Payer: Self-pay | Admitting: Cardiology

## 2015-10-10 ENCOUNTER — Encounter: Payer: Self-pay | Admitting: Cardiology

## 2015-10-10 ENCOUNTER — Ambulatory Visit (INDEPENDENT_AMBULATORY_CARE_PROVIDER_SITE_OTHER): Payer: PPO | Admitting: Cardiology

## 2015-10-10 VITALS — BP 154/76 | HR 54 | Ht 71.0 in | Wt 202.4 lb

## 2015-10-10 DIAGNOSIS — R0600 Dyspnea, unspecified: Secondary | ICD-10-CM

## 2015-10-10 DIAGNOSIS — I779 Disorder of arteries and arterioles, unspecified: Secondary | ICD-10-CM

## 2015-10-10 DIAGNOSIS — I739 Peripheral vascular disease, unspecified: Principal | ICD-10-CM

## 2015-10-10 NOTE — Patient Instructions (Signed)
Your physician recommends that you schedule a follow-up appointment in: 2 Months  Your physician has requested that you have an exercise tolerance test. For further information please visit [email or web address]. Please also follow instruction sheet, as given.  Your physician has requested that you have a carotid duplex. This test is an ultrasound of the carotid arteries in your neck. It looks at blood flow through these arteries that supply the brain with blood. Allow one hour for this exam. There are no restrictions or special instructions. February 2017  Your physician recommends that you return for lab work in: Today BNP

## 2015-10-10 NOTE — Progress Notes (Signed)
Cardiology Office Note   Date:  10/10/2015   ID:  Russell Griffith, DOB 1946/08/02, MRN [id number]  PCP:  Russell Kehr, MD  Cardiologist:   Russell Breeding, MD   Chief Complaint  Patient presents with  . Shortness of Breath      History of Present Illness: Russell Griffith is a 69 y.o. male who presents for evaluation of known CAD.   He is well known to Dr. Ron Griffith and Dr. Caryl Griffith.   On Dr. Kae Griffith  Retirement I am now assuming his care. He recently on remote pacemaker check was found to have paroxysmal atrial fibrillation. This is after he's had fibrillation ablation in flutter ablation. He was told to restart eloquence which she had been on previously but is not yet done this. He says he does notice some shortness of breath. This has been going on for a couple of months. He feels sometimes like he can't get a deep breath. He's had some increased tiredness when he's been doing activity such as exercising on his elliptical. He feels better when he's lying down in brief is better in that position. He's not been getting any chest tightness, neck or arm discomfort. She's not been having any presyncope or syncope. However, he did notice that while he usually has a hard time getting his heart rate increased while exercising he's had a few episodes where it might go up to 150.   Past Medical History  Diagnosis Date  . CAD (coronary artery disease)     Myoview March 2014  . Hyperlipidemia     Low HDL  . GERD (gastroesophageal reflux disease)     Barrett's esophagus  . Colonic polyp   . Aortic stenosis     Mild, echo, April, 2014  . Carotid artery disease ([place])   . Elevated bilirubin     Mild chronic elevation, 2.0 January, 2011 stable  . BPH (benign prostatic hyperplasia)   . Chronic foot pain 2009    Resolved with use of orthotics  . Hemorrhoids     September, 2012  . 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 A-fib ([place])   . PVC's (premature ventricular  contractions)   . Atrial flutter ([place])     Status post ablation  . HTN (hypertension)   . Primary osteoarthritis of left knee     Mild  . HX: anticoagulation   . Diverticulosis   . Tubular adenoma of colon   . Prolapsed internal hemorrhoids, grade 3 08/12/2015    Past Surgical History  Procedure Laterality Date  . Insert / replace / remove pacemaker  07/15/2012    initial placement  . Tonsillectomy  1956  . Coronary artery bypass graft  2000    CABG X5  . Radiofrequency  ablation for heart arrythmia    . Permanent pacemaker insertion N/A 07/15/2012    Procedure: PERMANENT PACEMAKER INSERTION;  Surgeon: Russell Sprang, MD;  Location[facility] CATH LAB;  Service: Cardiovascular;  Laterality: N/A;  . Colonoscopy    . Esophagogastroduodenoscopy       Current Outpatient Prescriptions  Medication Sig Dispense Refill  . apixaban (ELIQUIS) 5 MG TABS tablet Take 1 tablet (5 mg total) by mouth 2 (two) times daily. 60 tablet 11  . atorvastatin (LIPITOR) 20 MG tablet Take 20 mg by mouth daily.    . cholecalciferol (VITAMIN D) 1000 UNITS tablet Take 1 tablet (1,000 Units total) by mouth daily.  100 tablet 3  . desonide (DESOWEN) 0.05 % cream Apply 1 application topically daily as needed (skin isses).     . Multiple Vitamin (MULTIVITAMIN WITH MINERALS) TABS Take 1 tablet by mouth daily.    . pantoprazole (PROTONIX) 40 MG tablet Take 1 tablet (40 mg total) by mouth daily. 90 tablet 3  . ramipril (ALTACE) 2.5 MG capsule Take 2.5 mg by mouth daily.    . sildenafil (VIAGRA) 100 MG tablet Take 1 tablet (100 mg total) by mouth as needed for erectile dysfunction. 12 tablet 11   No current facility-administered medications for this visit.    Allergies:   Sulfonamide derivatives    ROS:  Please see the history of present illness.   Otherwise, review of systems are positive for none.   All other systems are reviewed and negative.    PHYSICAL EXAM: VS:  BP 154/76 mmHg  Pulse 54  Ht 5\' 11"  (1.803 m)   Wt 202 lb 6.4 oz (91.808 kg)  BMI 28.24 kg/m2 , BMI Body mass index is 28.24 kg/(m^2). GENERAL:  Well appearing HEENT:  Pupils equal round and reactive, fundi not visualized, oral mucosa unremarkable NECK:  No jugular venous distention, waveform within normal limits, carotid upstroke brisk and symmetric, no bruits, no thyromegaly LYMPHATICS:  No cervical, inguinal adenopathy LUNGS:  Clear to auscultation bilaterally BACK:  No CVA tenderness CHEST:  Well healed sternotomy scar. HEART:  PMI not displaced or sustained,S1 and S2 within normal limits, no S3, no S4, no clicks, no rubs,  soft early systolic murmur nonradiating, no diastolic murmurs ABD:  Flat, positive bowel sounds normal in frequency in pitch, no bruits, no rebound, no guarding, no midline pulsatile mass, no hepatomegaly, no splenomegaly EXT:  2 plus pulses throughout, no edema, no cyanosis no clubbing SKIN:  No rashes no nodules NEURO:  Cranial nerves II through XII grossly intact, motor grossly intact throughout PSYCH:  Cognitively intact, oriented to person place and time    EKG:  EKG is not ordered today.   Recent Labs: 07/30/2015: ALT 22; BUN 18; Creatinine, Ser 1.06; Hemoglobin 15.2; Platelets 186.0; Potassium 4.1; Sodium 141; TSH 6.20*    Lipid Panel    Component Value Date/Time   CHOL 114 07/30/2015 0759   TRIG 73.0 07/30/2015 0759   HDL 28.90* 07/30/2015 0759   CHOLHDL 4 07/30/2015 0759   VLDL 14.6 07/30/2015 0759   LDLCALC 70 07/30/2015 0759      Wt Readings from Last 3 Encounters:  10/10/15 202 lb 6.4 oz (91.808 kg)  08/12/15 201 lb 8 oz (91.4 kg)  07/23/15 203 lb (92.08 kg)      Other studies Reviewed: Additional studies/ records that were reviewed today include: Review of the above records demonstrates:  Please see elsewhere in the note.     ASSESSMENT AND PLAN:  ATRIAL FIB:   CHADS VASC score is 3.   We discussed the risks benefits of anticoagulation and he will start Eliquis.   He will stop  his aspirin.  PACEMAKER PLACEMENT:   He is up-to-date with follow-up.  CAD:   The patient does have some dyspnea. He's also had some tachycardia palpitations with exercise. I'm going to bring him back and start with a POET (Plain Old Exercise Treadmill) for evaluation.  CAROTID STENOSIS:   He is overdue for follow-up and we'll arrange for follow-up Dopplers in February.  DYSPNEA:   As above I will follow-up the POET (Plain Old Exercise Treadmill).   In addition I  will check a BNP level.  ABNORMAL ECHO:   He has mild valvular abnormalities on previous echo but well preserved ejection fraction. Is a soft systolic murmur and I would not suspect any worsening of these valvular lesions. However, I will have a low threshold for future echocardiography if he has continued symptoms.  Current medicines are reviewed at length with the patient today.  The patient does not have concerns regarding medicines.  The following changes have been made:  no change  Labs/ tests ordered today include:   Orders Placed This Encounter  Procedures  . B Nat Peptide  . Exercise Tolerance Test     Disposition:   FU with me in two months.    Signed, Russell Breeding, MD  10/10/2015 1:26 PM    [facility]

## 2015-10-11 LAB — BRAIN NATRIURETIC PEPTIDE: BRAIN NATRIURETIC PEPTIDE: 84.6 pg/mL (ref 0.0–100.0)

## 2015-10-29 ENCOUNTER — Other Ambulatory Visit (INDEPENDENT_AMBULATORY_CARE_PROVIDER_SITE_OTHER): Payer: PPO

## 2015-10-29 ENCOUNTER — Ambulatory Visit (INDEPENDENT_AMBULATORY_CARE_PROVIDER_SITE_OTHER): Payer: PPO | Admitting: Internal Medicine

## 2015-10-29 ENCOUNTER — Encounter: Payer: Self-pay | Admitting: Internal Medicine

## 2015-10-29 VITALS — BP 130/78 | HR 55 | Temp 97.7°F | Wt 202.0 lb

## 2015-10-29 DIAGNOSIS — G8929 Other chronic pain: Secondary | ICD-10-CM | POA: Insufficient documentation

## 2015-10-29 DIAGNOSIS — R1013 Epigastric pain: Secondary | ICD-10-CM | POA: Insufficient documentation

## 2015-10-29 DIAGNOSIS — E785 Hyperlipidemia, unspecified: Secondary | ICD-10-CM

## 2015-10-29 LAB — BASIC METABOLIC PANEL
BUN: 20 mg/dL (ref 6–23)
CO2: 29 mEq/L (ref 19–32)
Calcium: 9.5 mg/dL (ref 8.4–10.5)
Chloride: 106 mEq/L (ref 96–112)
Creatinine, Ser: 1.12 mg/dL (ref 0.40–1.50)
GFR: 69.34 mL/min (ref >60.00)
GLUCOSE: 107 mg/dL — AB (ref 70–99)
POTASSIUM: 4.5 meq/L (ref 3.5–5.1)
SODIUM: 142 meq/L (ref 135–145)

## 2015-10-29 LAB — URINALYSIS
Bilirubin Urine: NEGATIVE
Hgb urine dipstick: NEGATIVE
Ketones, ur: NEGATIVE
Leukocytes, UA: NEGATIVE
Nitrite: NEGATIVE
PH: 5.5 (ref 5.0–8.0)
Total Protein, Urine: NEGATIVE
URINE GLUCOSE: NEGATIVE
UROBILINOGEN UA: 1 (ref 0.0–1.0)

## 2015-10-29 LAB — CBC WITH DIFFERENTIAL/PLATELET
BASOS ABS: 0 10*3/uL (ref 0.0–0.1)
BASOS PCT: 0.6 % (ref 0.0–3.0)
EOS ABS: 0.1 10*3/uL (ref 0.0–0.7)
Eosinophils Relative: 2.3 % (ref 0.0–5.0)
HCT: 44.7 % (ref 39.0–52.0)
HEMOGLOBIN: 14.8 g/dL (ref 13.0–17.0)
Lymphocytes Relative: 26.1 % (ref 12.0–46.0)
Lymphs Abs: 1.7 10*3/uL (ref 0.7–4.0)
MCHC: 33 g/dL (ref 30.0–36.0)
MCV: 88.9 fl (ref 78.0–100.0)
MONO ABS: 1.2 10*3/uL — AB (ref 0.1–1.0)
Monocytes Relative: 18.7 % — ABNORMAL HIGH (ref 3.0–12.0)
Neutro Abs: 3.4 10*3/uL (ref 1.4–7.7)
Neutrophils Relative %: 52.3 % (ref 43.0–77.0)
PLATELETS: 177 10*3/uL (ref 150.0–400.0)
RBC: 5.03 Mil/uL (ref 4.22–5.81)
RDW: 13.8 % (ref 11.5–15.5)
WBC: 6.4 10*3/uL (ref 4.0–10.5)

## 2015-10-29 LAB — H. PYLORI ANTIBODY, IGG: H PYLORI IGG: NEGATIVE

## 2015-10-29 LAB — HEPATIC FUNCTION PANEL
ALT: 24 U/L (ref 0–53)
AST: 24 U/L (ref 0–37)
Albumin: 4.1 g/dL (ref 3.5–5.2)
Alkaline Phosphatase: 71 U/L (ref 39–117)
BILIRUBIN TOTAL: 2.3 mg/dL — AB (ref 0.2–1.2)
Bilirubin, Direct: 0.4 mg/dL — ABNORMAL HIGH (ref 0.0–0.3)
TOTAL PROTEIN: 6.6 g/dL (ref 6.0–8.3)

## 2015-10-29 LAB — LIPASE: Lipase: 33 U/L (ref 11.0–59.0)

## 2015-10-29 MED ORDER — HYOSCYAMINE SULFATE 0.125 MG PO TABS: 0.1250 mg | ORAL_TABLET | ORAL | Status: DC | PRN

## 2015-10-29 NOTE — Progress Notes (Signed)
Pre visit review using our clinic review tool, if applicable. No additional management support is needed unless otherwise documented below in the visit note. 

## 2015-10-29 NOTE — Progress Notes (Signed)
Subjective:  Patient ID: Russell Griffith ED Appleby, male    DOB: 1946-03-04  Age: 69 y.o. MRN: [id number]  CC: No chief complaint on file.   HPI [place] ED Creelman presents for mid-abd pain - subjective  bloating and episodic discomfort. May have some nausea. Worst pain level was at 6/10.Sx's have been not on every day basis. Episodes may last x hrs. No bowel habbit changes. Pt is lactose intolerant. H/o Barrett's. No new meds  Outpatient Prescriptions Prior to Visit  Medication Sig Dispense Refill  . apixaban (ELIQUIS) 5 MG TABS tablet Take 1 tablet (5 mg total) by mouth 2 (two) times daily. 60 tablet 11  . atorvastatin (LIPITOR) 20 MG tablet Take 20 mg by mouth daily.    . cholecalciferol (VITAMIN D) 1000 UNITS tablet Take 1 tablet (1,000 Units total) by mouth daily. 100 tablet 3  . desonide (DESOWEN) 0.05 % cream Apply 1 application topically daily as needed (skin isses).     . Multiple Vitamin (MULTIVITAMIN WITH MINERALS) TABS Take 1 tablet by mouth daily.    . pantoprazole (PROTONIX) 40 MG tablet Take 1 tablet (40 mg total) by mouth daily. 90 tablet 3  . ramipril (ALTACE) 2.5 MG capsule Take 2.5 mg by mouth daily.    . sildenafil (VIAGRA) 100 MG tablet Take 1 tablet (100 mg total) by mouth as needed for erectile dysfunction. 12 tablet 11   No facility-administered medications prior to visit.    ROS Review of Systems  Constitutional: Negative for fever, appetite change, fatigue and unexpected weight change.  HENT: Negative for congestion, nosebleeds, sneezing, sore throat and trouble swallowing.   Eyes: Negative for itching and visual disturbance.  Respiratory: Negative for cough.   Cardiovascular: Negative for chest pain, palpitations and leg swelling.  Gastrointestinal: Positive for nausea and abdominal pain. Negative for vomiting, diarrhea, constipation, blood in stool, abdominal distention and rectal pain.  Genitourinary: Negative for frequency and hematuria.  Musculoskeletal: Negative  for back pain, joint swelling, gait problem and neck pain.  Skin: Negative for rash.  Neurological: Negative for dizziness, tremors, speech difficulty and weakness.  Psychiatric/Behavioral: Negative for sleep disturbance, dysphoric mood and agitation. The patient is not nervous/anxious.     Objective:  BP 130/78 mmHg  Pulse 55  Temp(Src) 97.7 F (36.5 C) (Oral)  Wt 202 lb (91.627 kg)  SpO2 97%  BP Readings from Last 3 Encounters:  10/29/15 130/78  10/10/15 154/76  08/12/15 134/70    Wt Readings from Last 3 Encounters:  10/29/15 202 lb (91.627 kg)  10/10/15 202 lb 6.4 oz (91.808 kg)  08/12/15 201 lb 8 oz (91.4 kg)    Physical Exam  Constitutional: He is oriented to person, place, and time. He appears well-developed. No distress.  NAD  HENT:  Mouth/Throat: Oropharynx is clear and moist.  Eyes: Conjunctivae are normal. Pupils are equal, round, and reactive to light.  Neck: Normal range of motion. No JVD present. No thyromegaly present.  Cardiovascular: Normal rate, regular rhythm, normal heart sounds and intact distal pulses.  Exam reveals no gallop and no friction rub.   No murmur heard. Pulmonary/Chest: Effort normal and breath sounds normal. No respiratory distress. He has no wheezes. He has no rales. He exhibits no tenderness.  Abdominal: Soft. Bowel sounds are normal. He exhibits no distension and no mass. There is no tenderness. There is no rebound and no guarding.  Musculoskeletal: Normal range of motion. He exhibits no edema or tenderness.  Lymphadenopathy:    He has  no cervical adenopathy.  Neurological: He is alert and oriented to person, place, and time. He has normal reflexes. No cranial nerve deficit. He exhibits normal muscle tone. He displays a negative Romberg sign. Coordination and gait normal.  Skin: Skin is warm and dry. No rash noted.  Psychiatric: He has a normal mood and affect. His behavior is normal. Judgment and thought content normal.    Lab Results   Component Value Date   WBC 5.9 07/30/2015   HGB 15.2 07/30/2015   HCT 44.3 07/30/2015   PLT 186.0 07/30/2015   GLUCOSE 105* 07/30/2015   CHOL 114 07/30/2015   TRIG 73.0 07/30/2015   HDL 28.90* 07/30/2015   LDLCALC 70 07/30/2015   ALT 22 07/30/2015   AST 22 07/30/2015   NA 141 07/30/2015   K 4.1 07/30/2015   CL 106 07/30/2015   CREATININE 1.06 07/30/2015   BUN 18 07/30/2015   CO2 29 07/30/2015   TSH 6.20* 07/30/2015   PSA 0.94 07/30/2015   INR 1.53* 07/15/2012   HGBA1C 5.8 07/30/2015    Ct Shoulder Right W Contrast  05/28/2015  CLINICAL DATA:  Progressive right shoulder pain for 6-8 months. EXAM: CT ARTHROGRAPHY OF THE RIGHT SHOULDER TECHNIQUE: Multidetector CT imaging was performed following the standard protocol after injection of dilute contrast into the joint. COMPARISON:  Radiographs dated 12/03/2014 FINDINGS: Rotator cuff: There is a partial thickness linear articular surface tear of the of the distal infraspinatus tendon. The tear is approximately 2 cm in width and 16 mm in length. It extends approximately 50% of the way through the tendon. The contact with the articular surfaces at the insertion on the greater tuberosity. There is also a longitudinal articular surface split tear of the subscapularis tendon best seen on images 13 through 20 of series 603. Muscles:  No atrophy. There is contrast extending into the distal fibers of the teres minor muscle but the tendinous attachment is intact. I suspect this is a benign finding. Labrum:  Normal. Long head of the biceps tendon:  Properly located and intact. AC joint: Type 2 acromion. 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 Inferior osteophyte on the distal clavicle could predispose to impingement. Bones: AC joint arthropathy. The other osseous structures are normal. IMPRESSION: 1. Articular surface partial thickness tear of the distal infraspinatus tendon. 2. Longitudinal articular surface split tear  of the subscapularis tendon. 3. 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 Electronically Signed   By: Lorriane Shire M.D.   On: 05/28/2015 17:39   Dg Fluoro Guide Ndl Plcd/bx/inj/loc  05/28/2015  CLINICAL DATA:  RIGHT shoulder pain. FLUOROSCOPY TIME:  18 seconds.  One spot film. PROCEDURE: RIGHT SHOULDER INJECTION UNDER FLUOROSCOPY Informed written consent was obtained.  Time-out was performed. An appropriate skin entrance site was determined. The site was marked, prepped with Betadine, draped in the usual sterile fashion, and infiltrated locally with 1% Lidocaine. 22 gauge spinal needle was advanced to the superomedial margin of the humeral head under intermittent fluoroscopy. 1 ml of Lidocaine injected easily. A mixture of 15 mL of dilute Omnipaque 180 mixed with 1% lidocaine was then used to opacify the RIGHT shoulder capsule. No immediate complication. IMPRESSION: Technically successful RIGHT shoulder injection for MRI. Electronically Signed   By: Staci Righter M.D.   On: 05/28/2015 16:08    Assessment & Plan:   Diagnoses and all orders for this visit:  Abdominal pain, epigastric -  Urinalysis; Future -     Hepatic function panel; Future -     CBC with Differential/Platelet; Future -     Basic metabolic panel; Future -     H. pylori antibody, IgG; Future -     Lipase; Future  Other orders -     hyoscyamine (LEVSIN, ANASPAZ) 0.125 MG tablet; Take 1 tablet (0.125 mg total) by mouth every 4 (four) hours as needed for cramping.  I am having Mr. Kostrzewski start on hyoscyamine. I am also having him maintain his multivitamin with minerals, desonide, pantoprazole, atorvastatin, ramipril, cholecalciferol, sildenafil, and apixaban.  Meds ordered this encounter  Medications  . hyoscyamine (LEVSIN, ANASPAZ) 0.125 MG tablet    Sig: Take 1 tablet (0.125 mg total) by mouth every 4 (four) hours as needed for cramping.    Dispense:   100 tablet    Refill:  3     Follow-up: Return in about 3 weeks (around 11/19/2015).  Walker Kehr, MD

## 2015-10-29 NOTE — Assessment & Plan Note (Signed)
Take Lipitor every other day or hold for 2 weeks due to abd sx's

## 2015-10-29 NOTE — Patient Instructions (Addendum)
Put on hold a multivitamin Take Lipitor every other day or hold for 2 weeks Try Lactaid with cheese

## 2015-10-29 NOTE — Assessment & Plan Note (Addendum)
11/16 recurrent It could be related to med side effects, lactose intolerance and other reasons Put on hold a multivitamin Take Lipitor every other day or hold for 2 weeks Try Lactaid with cheese Labs May need EGD

## 2015-11-04 ENCOUNTER — Encounter: Payer: Self-pay | Admitting: Internal Medicine

## 2015-11-14 ENCOUNTER — Encounter (HOSPITAL_COMMUNITY): Payer: PPO

## 2015-11-20 ENCOUNTER — Ambulatory Visit (INDEPENDENT_AMBULATORY_CARE_PROVIDER_SITE_OTHER): Payer: PPO | Admitting: Internal Medicine

## 2015-11-20 ENCOUNTER — Encounter: Payer: Self-pay | Admitting: Internal Medicine

## 2015-11-20 VITALS — BP 118/70 | HR 68 | Ht 71.0 in | Wt 193.0 lb

## 2015-11-20 DIAGNOSIS — K648 Other hemorrhoids: Secondary | ICD-10-CM

## 2015-11-20 DIAGNOSIS — K642 Third degree hemorrhoids: Secondary | ICD-10-CM
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
## 2015-11-20 NOTE — Assessment & Plan Note (Signed)
RP banded Hold Eliquis x 1 week - discussed w/ dr. Lovena Le Risk of stroke reviewed w/ patient also RTC jan repeat banding

## 2015-11-20 NOTE — Patient Instructions (Addendum)
HEMORRHOID BANDING PROCEDURE    FOLLOW-UP CARE   1. The procedure you have had should have been relatively painless since the banding of the area involved does not have nerve endings and there is no pain sensation.  The rubber band cuts off the blood supply to the hemorrhoid and the band may fall off as soon as 48 hours after the banding (the band may occasionally be seen in the toilet bowl following a bowel movement). You may notice a temporary feeling of fullness in the rectum which should respond adequately to plain Tylenol or Motrin.  2. Following the banding, avoid strenuous exercise that evening and resume full activity the next day.  A sitz bath (soaking in a warm tub) or bidet is soothing, and can be useful for cleansing the area after bowel movements.     3. To avoid constipation, take two tablespoons of natural wheat bran, natural oat bran, flax, Benefiber or any over the counter fiber supplement and increase your water intake to 7-8 glasses daily.    4. Unless you have been prescribed anorectal medication, do not put anything inside your rectum for two weeks: No suppositories, enemas, fingers, etc.  5. Occasionally, you may have more bleeding than usual after the banding procedure.  This is often from the untreated hemorrhoids rather than the treated one.  Don't be concerned if there is a tablespoon or so of blood.  If there is more blood than this, lie flat with your bottom higher than your head and apply an ice pack to the area. If the bleeding does not stop within a half an hour or if you feel faint, call our office at [phone] or go to the emergency room.  6. Problems are not common; however, if there is a substantial amount of bleeding, severe pain, chills, fever or difficulty passing urine (very rare) or other problems, you should call us at [phone] or report to the nearest emergency room.  7. Do not stay seated continuously for more than 2-3 hours for a day or two  after the procedure.  Tighten your buttock muscles 10-15 times every two hours and take 10-15 deep breaths every 1-2 hours.  Do not spend more than a few minutes on the toilet if you cannot empty your bowel; instead re-visit the toilet at a later time.    Hold your Eliquis thru 11/27/2015, restarting it 11/28/2015.  We will see you at your next visit 12/06/15 at 2:15pm  Make sure and hold your Eliquis the day of the procedure per Dr Carlean Purl.    I appreciate the opportunity to care for you.

## 2015-11-25 ENCOUNTER — Other Ambulatory Visit: Payer: Self-pay | Admitting: Cardiology

## 2015-11-28 ENCOUNTER — Telehealth (HOSPITAL_COMMUNITY): Payer: Self-pay

## 2015-11-28 NOTE — Telephone Encounter (Signed)
Encounter complete. 

## 2015-11-29 ENCOUNTER — Telehealth: Payer: Self-pay | Admitting: Cardiology

## 2015-11-29 NOTE — Telephone Encounter (Signed)
Requesting surgical clearance:   1. Type of surgery: Right shoulder arthroscopic SAD, DCR and mini-open rotator cuff repair  2. Surgeon: Dr. Joni Fears, MD  3. Surgical date: pending clearance  4. Medications that need to be held: See PharmD notebelow   5. CAD: yes     6. I will defer to: [facility] AttnAlvina Chou [id number] fax [phone] phone

## 2015-11-29 NOTE — Telephone Encounter (Signed)
Per office protocol ok to hold Eliquis for 3 days prior to surgery.  No need to bridge with anything else.

## 2015-11-29 NOTE — Telephone Encounter (Signed)
[facility]) is wanting to know if Russell Griffith can come off of his Eliquis prior to surgery on 12/19/15 , also wants to know if he wants him to bridge it with something else . Please call   Thanks

## 2015-12-01 HISTORY — PX: OTHER SURGICAL HISTORY: SHX169

## 2015-12-03 ENCOUNTER — Ambulatory Visit (HOSPITAL_COMMUNITY)
Admission: RE | Admit: 2015-12-03 | Discharge: 2015-12-03 | Disposition: A | Discharge status: Home / Self Care | Payer: PPO | Source: Ambulatory Visit | Attending: Cardiology | Admitting: Cardiology

## 2015-12-03 DIAGNOSIS — R0600 Dyspnea, unspecified: Secondary | ICD-10-CM | POA: Diagnosis not present

## 2015-12-03 LAB — EXERCISE TOLERANCE TEST
CHL CUP MPHR: 153 {beats}/min
CSEPED: 9 min
CSEPHR: 88 %
CSEPPHR: 136 {beats}/min
Estimated workload: 11.2 METS
Exercise duration (sec): 44 s
RPE: 17
Rest HR: 55 {beats}/min

## 2015-12-04 NOTE — Telephone Encounter (Signed)
Russell Griffith is calling in requesting pre op clearance for the pt. She says that she will need:   - recent office note( which will be on 1/12)  - recent EKG  - recent pacemaker check.   Please f/u with her Thanks

## 2015-12-06 ENCOUNTER — Ambulatory Visit (INDEPENDENT_AMBULATORY_CARE_PROVIDER_SITE_OTHER): Payer: PPO | Admitting: Internal Medicine

## 2015-12-06 ENCOUNTER — Encounter: Payer: Self-pay | Admitting: Internal Medicine

## 2015-12-06 VITALS — BP 125/70 | HR 60 | Ht 71.0 in | Wt 198.0 lb

## 2015-12-06 DIAGNOSIS — K642 Third degree hemorrhoids: Secondary | ICD-10-CM

## 2015-12-06 DIAGNOSIS — K648 Other hemorrhoids: Secondary | ICD-10-CM

## 2015-12-06 NOTE — Patient Instructions (Signed)
  HEMORRHOID BANDING PROCEDURE    FOLLOW-UP CARE   1. The procedure you have had should have been relatively painless since the banding of the area involved does not have nerve endings and there is no pain sensation.  The rubber band cuts off the blood supply to the hemorrhoid and the band may fall off as soon as 48 hours after the banding (the band may occasionally be seen in the toilet bowl following a bowel movement). You may notice a temporary feeling of fullness in the rectum which should respond adequately to plain Tylenol or Motrin.  2. Following the banding, avoid strenuous exercise that evening and resume full activity the next day.  A sitz bath (soaking in a warm tub) or bidet is soothing, and can be useful for cleansing the area after bowel movements.     3. To avoid constipation, take two tablespoons of natural wheat bran, natural oat bran, flax, Benefiber or any over the counter fiber supplement and increase your water intake to 7-8 glasses daily.    4. Unless you have been prescribed anorectal medication, do not put anything inside your rectum for two weeks: No suppositories, enemas, fingers, etc.  5. Occasionally, you may have more bleeding than usual after the banding procedure.  This is often from the untreated hemorrhoids rather than the treated one.  Don't be concerned if there is a tablespoon or so of blood.  If there is more blood than this, lie flat with your bottom higher than your head and apply an ice pack to the area. If the bleeding does not stop within a half an hour or if you feel faint, call our office at [phone] or go to the emergency room.  6. Problems are not common; however, if there is a substantial amount of bleeding, severe pain, chills, fever or difficulty passing urine (very rare) or other problems, you should call us at [phone] or report to the nearest emergency room.  7. Do not stay seated continuously for more than 2-3 hours for a day or  two after the procedure.  Tighten your buttock muscles 10-15 times every two hours and take 10-15 deep breaths every 1-2 hours.  Do not spend more than a few minutes on the toilet if you cannot empty your bowel; instead re-visit the toilet at a later time.    Today we are giving you a handout on benefiber to read.    Follow up with Dr Carlean Purl as needed.    Re-Start your Eliquis on 12/14/15.    Good luck with your shoulder surgery.     I appreciate the opportunity to care for you. Silvano Rusk, MD, [facility]

## 2015-12-06 NOTE — Progress Notes (Signed)
   PROCEDURE NOTE: The patient presents with symptomatic grade 2 - 3  hemorrhoids, requesting rubber band ligation of his/her hemorrhoidal disease.  All risks, benefits and alternative forms of therapy were described and informed consent was obtained.   The anorectum was pre-medicated with 0.125% nitroglycerin and 5% lidocaine The decision was made to band the LL internal hemorrhoid, and the [place] was used to perform band ligation without complication.  Digital anorectal examination was then performed to assure proper positioning of the band, and to adjust the banded tissue as required.  The patient was discharged home without pain or other issues.  Dietary and behavioral recommendations were given and along with follow-up instructions.     The following adjunctive treatments were recommended:  Total softener when necessary  The patient will return as needed for  follow-up and possible additional banding as required. No complications were encountered and the patient tolerated the procedure well.   Prolapsed internal hemorrhoids, grade 3 LL banded F/U as needed Having rotator cuff repair 2 weeks so will see how he does at this point Hold Eliquis x 1 week - rare risk of stroke off  This medication has been previously reviewed

## 2015-12-06 NOTE — Assessment & Plan Note (Signed)
LL banded F/U as needed Having rotator cuff repair 2 weeks so will see how he does at this point Hold Eliquis x 1 week - rare risk of stroke off  This medication has been previously reviewed

## 2015-12-12 ENCOUNTER — Encounter: Payer: Self-pay | Admitting: Cardiology

## 2015-12-12 ENCOUNTER — Ambulatory Visit (INDEPENDENT_AMBULATORY_CARE_PROVIDER_SITE_OTHER): Payer: PPO | Admitting: Cardiology

## 2015-12-12 VITALS — BP 140/70 | HR 56 | Ht 71.0 in | Wt 199.3 lb

## 2015-12-12 DIAGNOSIS — I2510 Atherosclerotic heart disease of native coronary artery without angina pectoris: Secondary | ICD-10-CM | POA: Diagnosis not present

## 2015-12-12 NOTE — Patient Instructions (Addendum)
Medication Instructions:  Your physician recommends that you continue on your current medications as directed. Please refer to the Current Medication list given to you today.   Labwork: none  Testing/Procedures: none  Follow-Up: Your physician wants you to follow-up in: 12 months with Dr. Allena Napoleon will receive a reminder letter in the mail two months in advance. If you don't receive a letter, please call our office to schedule the follow-up appointment.   Any Other Special Instructions Will Be Listed Below (If Applicable).  You are cleared for surgery and need to hold Eliquis for THREE days before surgery.   If you need a refill on your cardiac medications before your next appointment, please call your pharmacy.

## 2015-12-12 NOTE — Progress Notes (Signed)
Cardiology Office Note   Date:  12/12/2015   ID:  Russell Griffith, DOB [birth year], MRN [id number]  PCP:  Walker Kehr, MD  Cardiologist:   Minus Breeding, MD   No chief complaint on file.     History of Present Illness: Russell Griffith is a 70 y.o. male who presents for evaluation of known CAD.   He is well known to Dr. Ron Parker and Dr. Caryl Comes.   He has paroxysmal atrial fibrillation and has had a flutter ablation.  He has a pacemaker.  At the last visit he was describing some decreased exercise tolerance or dyspnea with exertion. However, I had him perform a POET (Plain Old Exercise Treadmill) and he did well walking to 9 minutes without ischemic ST changes. He did have some hypertensive blood pressure response at peak of exercise.  Actually he's been doing relatively well. He gets on the elliptical he's not been having any new shortness of breath, PND or orthopnea. He's had no new chest pressure, neck or arm discomfort.  He is going to have a shoulder surgery soon.   Past Medical History  Diagnosis Date  . CAD (coronary artery disease)     Myoview March 2014  . Hyperlipidemia     Low HDL  . GERD (gastroesophageal reflux disease)     Barrett's esophagus  . Colonic polyp   . Aortic stenosis     Mild, echo, April, 2014  . Carotid artery disease ([place])   . Elevated bilirubin     Mild chronic elevation, 2.0 January, 2011 stable  . BPH (benign prostatic hyperplasia)   . Chronic foot pain 2009    Resolved with use of orthotics  . Hemorrhoids     September, 2012  . 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 A-fib ([place])   . PVC's (premature ventricular contractions)   . Atrial flutter ([place])     Status post ablation  . HTN (hypertension)   . Primary osteoarthritis of left knee     Mild  . HX: anticoagulation   . Diverticulosis   . Tubular adenoma of colon   . Prolapsed internal hemorrhoids, grade 3 08/12/2015    Past Surgical History  Procedure Laterality  Date  . Insert / replace / remove pacemaker  07/15/2012    initial placement  . Tonsillectomy  1956  . Coronary artery bypass graft  2000    CABG X5  . Radiofrequency  ablation for heart arrythmia    . Permanent pacemaker insertion N/A 07/15/2012    Procedure: PERMANENT PACEMAKER INSERTION;  Surgeon: Deboraha Sprang, MD;  Location: [facility] CATH LAB;  Service: Cardiovascular;  Laterality: N/A;  . Colonoscopy    . Esophagogastroduodenoscopy    . Hemorrhoid banding       Current Outpatient Prescriptions  Medication Sig Dispense Refill  . apixaban (ELIQUIS) 5 MG TABS tablet Take 1 tablet (5 mg total) by mouth 2 (two) times daily. 60 tablet 11  . atorvastatin (LIPITOR) 20 MG tablet TAKE 1 TABLET ONCE DAILY. 30 tablet 2  . cholecalciferol (VITAMIN D) 1000 UNITS tablet Take 1 tablet (1,000 Units total) by mouth daily. 100 tablet 3  . hyoscyamine (LEVSIN, ANASPAZ) 0.125 MG tablet Take 1 tablet (0.125 mg total) by mouth every 4 (four) hours as needed for cramping. 100 tablet 3  . pantoprazole (PROTONIX) 40 MG tablet Take 1 tablet (40 mg total) by mouth daily. 90 tablet 3  .  ramipril (ALTACE) 2.5 MG capsule Take 2.5 mg by mouth daily.    . sildenafil (VIAGRA) 100 MG tablet Take 1 tablet (100 mg total) by mouth as needed for erectile dysfunction. 12 tablet 11   No current facility-administered medications for this visit.    Allergies:   Cayenne and Sulfonamide derivatives    ROS:  Please see the history of present illness.   Otherwise, review of systems are positive for none.   All other systems are reviewed and negative.    PHYSICAL EXAM: VS:  BP 140/70 mmHg  Pulse 56  Ht 5\' 11"  (1.803 m)  Wt 199 lb 4.8 oz (90.402 kg)  BMI 27.81 kg/m2 , BMI Body mass index is 27.81 kg/(m^2). GENERAL:  Well appearing NECK:  No jugular venous distention, waveform within normal limits, carotid upstroke brisk and symmetric, no bruits, no thyromegaly LUNGS:  Clear to auscultation bilaterally CHEST:  Well healed  sternotomy scar. HEART:  PMI not displaced or sustained,S1 and S2 within normal limits, no S3, no S4, no clicks, no rubs,  soft early systolic murmur nonradiating, no diastolic murmurs ABD:  Flat, positive bowel sounds normal in frequency in pitch, no bruits, no rebound, no guarding, no midline pulsatile mass, no hepatomegaly, no splenomegaly EXT:  2 plus pulses throughout, no edema, no cyanosis no clubbing   EKG:  EKG is not ordered today.   Recent Labs: 07/30/2015: TSH 6.20* 10/29/2015: ALT 24; BUN 20; Creatinine, Ser 1.12; Hemoglobin 14.8; Platelets 177.0; Potassium 4.5; Sodium 142    Lipid Panel    Component Value Date/Time   CHOL 114 07/30/2015 0759   TRIG 73.0 07/30/2015 0759   HDL 28.90* 07/30/2015 0759   CHOLHDL 4 07/30/2015 0759   VLDL 14.6 07/30/2015 0759   LDLCALC 70 07/30/2015 0759      Wt Readings from Last 3 Encounters:  12/12/15 199 lb 4.8 oz (90.402 kg)  12/06/15 198 lb (89.812 kg)  11/20/15 193 lb (87.544 kg)      Other studies Reviewed: Additional studies/ records that were reviewed today include: Review of the above records demonstrates:  Please see elsewhere in the note.     ASSESSMENT AND PLAN:  ATRIAL FIB:   CHADS VASC score is 3.   He started Eliquis.  PACEMAKER PLACEMENT:   He is up-to-date with follow-up.  CAD:   He had a negative POET (Plain Old Exercise Treadmill).  He will continue the meds as listed.   CAROTID STENOSIS:   He is overdue for follow-up and we'll arrange for follow-up Dopplers in February.  DYSPNEA:  BNP was normal as was the stress test above.symptoms are slightly improved. No change in therapy is indicated.  PREOP:  The patient has no high-risk findings and a negative stress test. Therefore, based on ACC/AHA guidelines, the patient would be at acceptable risk for the planned procedure without further cardiovascular testing.  He will stop his Eliquis 3 days before the surgery.    ABNORMAL ECHO:   He has mild valvular  abnormalities on previous echo but well preserved ejection fraction. Is a soft systolic murmur and I would not suspect any worsening of these valvular lesions. However, I will have a low threshold for future echocardiography if he has continued symptoms.  Current medicines are reviewed at length with the patient today.  The patient does not have concerns regarding medicines.  The following changes have been made:  no change  Labs/ tests ordered today include:   No orders of the defined types  were placed in this encounter.     Disposition:   FU with me in 12 months.    Signed, Minus Breeding, MD  12/12/2015 3:41 PM    [facility]

## 2015-12-13 NOTE — Telephone Encounter (Signed)
Received surgical clearance from [employer].Dr.Hochrein cleared patient for surgery.Ok to hold Eliquis 3 days prior to surgery.Form faxed back to fax # [phone].  Received surgical clearance from [place] requesting patient information.Patient's last office note,ekg,treadmill,Dr.Klein's last office note and pacemaker information faxed to fax # [phone].

## 2015-12-19 DIAGNOSIS — G8918 Other acute postprocedural pain: Secondary | ICD-10-CM | POA: Diagnosis not present

## 2015-12-19 DIAGNOSIS — S46111D Strain of muscle, fascia and tendon of long head of biceps, right arm, subsequent encounter: Secondary | ICD-10-CM | POA: Diagnosis not present

## 2015-12-19 DIAGNOSIS — M7541 Impingement syndrome of right shoulder: Secondary | ICD-10-CM | POA: Diagnosis not present

## 2015-12-19 DIAGNOSIS — M75111 Incomplete rotator cuff tear or rupture of right shoulder, not specified as traumatic: Secondary | ICD-10-CM | POA: Diagnosis not present

## 2015-12-19 DIAGNOSIS — M66821 Spontaneous rupture of other tendons, right upper arm: Secondary | ICD-10-CM | POA: Diagnosis not present

## 2015-12-19 DIAGNOSIS — M659 Synovitis and tenosynovitis, unspecified: Secondary | ICD-10-CM | POA: Diagnosis not present

## 2015-12-19 DIAGNOSIS — M19011 Primary osteoarthritis, right shoulder: Secondary | ICD-10-CM | POA: Diagnosis not present

## 2015-12-30 ENCOUNTER — Ambulatory Visit (INDEPENDENT_AMBULATORY_CARE_PROVIDER_SITE_OTHER): Payer: PPO | Admitting: *Deleted

## 2015-12-30 DIAGNOSIS — I495 Sick sinus syndrome: Secondary | ICD-10-CM | POA: Diagnosis not present

## 2015-12-30 NOTE — Progress Notes (Signed)
Remote pacemaker transmission.   

## 2015-12-31 ENCOUNTER — Ambulatory Visit: Payer: PPO | Attending: Orthopaedic Surgery

## 2015-12-31 DIAGNOSIS — Z9889 Other specified postprocedural states: Secondary | ICD-10-CM | POA: Diagnosis not present

## 2015-12-31 DIAGNOSIS — M25511 Pain in right shoulder: Secondary | ICD-10-CM | POA: Insufficient documentation

## 2015-12-31 DIAGNOSIS — R531 Weakness: Secondary | ICD-10-CM

## 2015-12-31 NOTE — Addendum Note (Signed)
Addended by: Dollene Cleveland on: 12/31/2015 06:42 PM   Modules accepted: Orders

## 2015-12-31 NOTE — Therapy (Addendum)
[place], [name], [address] Phone: [phone]   Fax:  [phone]  Physical Therapy Evaluation  Patient Details  Name: Russell Griffith MRN: [id number] Date of Birth: 12-Nov-1946 Referring Provider: Durward Fortes  Encounter Date: 12/31/2015      PT End of Session - 12/31/15 1823    Visit Number 1   Number of Visits 24   Date for PT Re-Evaluation 02/21/16   PT Start Time 1105   PT Stop Time 1150   PT Time Calculation (min) 45 min   Activity Tolerance Patient tolerated treatment well   Behavior During Therapy [facility] for tasks assessed/performed      Past Medical History  Diagnosis Date  . CAD (coronary artery disease)     Myoview March 2014  . Hyperlipidemia     Low HDL  . GERD (gastroesophageal reflux disease)     Barrett's esophagus  . Colonic polyp   . Aortic stenosis     Mild, echo, April, 2014  . Carotid artery disease ([place])   . Elevated bilirubin     Mild chronic elevation, 2.0 January, 2011 stable  . BPH (benign prostatic hyperplasia)   . Chronic foot pain 2009    Resolved with use of orthotics  . Hemorrhoids     September, 2012  . 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 A-fib ([place])   . PVC's (premature ventricular contractions)   . Atrial flutter ([place])     Status post ablation  . HTN (hypertension)   . Primary osteoarthritis of left knee     Mild  . HX: anticoagulation   . Diverticulosis   . Tubular adenoma of colon   . Prolapsed internal hemorrhoids, grade 3 08/12/2015    Past Surgical History  Procedure Laterality Date  . Insert / replace / remove pacemaker  07/15/2012    initial placement  . Tonsillectomy  1956  . Coronary artery bypass graft  2000    CABG X5  . Radiofrequency  ablation for heart arrythmia    . Permanent pacemaker insertion N/A 07/15/2012    Procedure: PERMANENT PACEMAKER INSERTION;  Surgeon: Deboraha Sprang, MD;  Location: [facility] CATH LAB;   Service: Cardiovascular;  Laterality: N/A;  . Colonoscopy    . Esophagogastroduodenoscopy    . Hemorrhoid banding      There were no vitals filed for this visit.  Visit Diagnosis:  H/O repair of right rotator cuff - Plan: PT plan of care cert/re-cert  Pain in joint of right shoulder - Plan: PT plan of care cert/re-cert  Weakness - Plan: PT plan of care cert/re-cert      Subjective Assessment - 12/31/15 1755    Subjective Pt underwent R partial RCT and biceps repair on 12/19/15 by Dr Durward Fortes. Pt sees MD for follow up 01/12/16. MD orders for PROM for next 3 weeks (until Feb 20th). Pt is wearing sling at all times.    Limitations Lifting;House hold activities  overhead activities, bathing dressing grooming   How long can you sit comfortably? NA   How long can you stand comfortably? NA   How long can you walk comfortably? NA   Currently in Pain? Yes   Pain Score 4    Pain Location Shoulder   Pain Orientation Right   Pain Descriptors / Indicators Aching;Sharp   Pain Type Surgical pain   Pain Onset 1 to 4 weeks ago   Pain Frequency  Intermittent   Aggravating Factors  Unable to move it   Pain Relieving Factors ice rest , tylenol,    Effect of Pain on Daily Activities ADLs dressing, grooming   Multiple Pain Sites No            [facility] PT Assessment - 12/31/15 0001    Assessment   Medical Diagnosis R RCR and Biceps   Referring Provider [place]   Onset Date/Surgical Date 12/19/15   Hand Dominance Right   Next MD Visit 01/13/16   Prior Therapy none   Precautions   Precautions Other (comment)  NO ESTIM- pt has A-FIB   Precaution Comments PROM only for 3 weeks ( until 01/20/16)   Restrictions   Weight Bearing Restrictions No   Balance Screen   Has the patient fallen in the past 6 months No   [place] residence   Prior Function   Level of Independence Independent   Cognition   Overall Cognitive Status Within Functional Limits for  tasks assessed   Observation/Other Assessments   Focus on Therapeutic Outcomes (FOTO)  Intake: 71% impaired. Predicted: 40% impaired   ROM / Strength   AROM / PROM / Strength AROM;PROM;Strength   AROM   Overall AROM  --  deferred due to PROM only    PROM   PROM Assessment Site Shoulder   Right/Left Shoulder Right   Right Shoulder Flexion 75 Degrees   Right Shoulder ABduction 60 Degrees   Right Shoulder Internal Rotation 40 Degrees  @45  degrees ABD   Right Shoulder External Rotation 15 Degrees  @45  degrees ABD   Strength   Overall Strength --  deferred due to PROM only    Palpation   Palpation comment TPs at biceps and deltoid region                   [facility] Adult PT Treatment/Exercise - 12/31/15 0001    Self-Care   Self-Care RICE;Other Self-Care Comments   Other Self-Care Comments  Pendulums, use of sling, don't use arm, positioning with pillows for bed, icing 3 times a day   Exercises   Exercises Shoulder   Shoulder Exercises: Standing   Other Standing Exercises pendulums 10 x each    Manual Therapy   Manual Therapy Soft tissue mobilization;Passive ROM   Soft tissue mobilization STM to biceps, upper trap, teres and sub scap region, deltoid, and anterior shoulder   Passive ROM PROM R shoulder all directions.                 PT Education - 12/31/15 1822    Education provided Yes   Education Details Pendulums, use of sling, don't use arm, positioning with pillows for bed, icing 3 times a day, PT POC   Person(s) Educated Patient   Methods Explanation;Demonstration   Comprehension Verbalized understanding;Returned demonstration          PT Short Term Goals - 12/31/15 1828    PT SHORT TERM GOAL #1   Title Pt will be I with initial HEP for continued strengthening and mobility by 01/28/16.    Time 4   Period Weeks   Status New   PT SHORT TERM GOAL #2   Title Pt will be able to demo posture as it related to upper body, shoulder, and cervical spine by  01/28/16    Time 4   Period Weeks   Status New   PT SHORT TERM GOAL #3   Title R shoulder  PROM will imrpove to 110 degrees pain-free by 01/28/16.    Time 4   Period Weeks   Status New           PT Long Term Goals - 01-21-2016 1829    PT LONG TERM GOAL #1   Title R shoulder AROM will improve to 90 degrees pain-free for improved overhead reaching activities by 02/21/16.   Time 8   Period Weeks   Status New   PT LONG TERM GOAL #2   Title Pt will be able to lift 2# overhead x 10 reps without pain in order to return to pain free functional activities for lifting overhead by 02/21/16.   Time 8   Period Weeks   Status New   PT LONG TERM GOAL #3   Title FOTO score will improve from 71% limited to <40% to demo improved function and mobility by 02/21/16.    Time 8   Period Weeks   Status New               Plan - 2016/01/21 1823    Clinical Impression Statement Pt underwent R partial RCT and biceps repair on 12/19/15 by Dr Durward Fortes. Pt presents with impairments including pain, impaired mobility/ROM, and impaired strength, which limit pt's functional abilities with reaching, lifting, carrying, dressing, grooming.  Pt will benefit from oupt PT for 3 times a week for 8 weeks for UE strengthening, stretching, pt education in order to address these impairments and functional limitations and return pt to pain-free PLOF. Pt is restricted to PROM only until 01/20/16 per MD orders.    Pt will benefit from skilled therapeutic intervention in order to improve on the following deficits Decreased coordination;Decreased activity tolerance;Decreased mobility;Decreased knowledge of precautions;Decreased endurance;Decreased range of motion;Impaired UE functional use;Increased muscle spasms;Impaired perceived functional ability;Improper body mechanics;Decreased strength;Postural dysfunction;Pain   Rehab Potential Good   PT Frequency 3x / week   PT Duration 8 weeks   PT Treatment/Interventions ADLs/Self Care  Home Management;Cryotherapy;Moist Heat;Therapeutic exercise;Therapeutic activities;Functional mobility training;Neuromuscular re-education;Patient/family education;Manual techniques;Passive range of motion;Taping;Dry needling;Ultrasound NO ESTIM - pt has A-FIB   PT Next Visit Plan Review pendulum. PROM ONLY until 01/20/16.    PT Home Exercise Plan pendulum per MD   Consulted and Agree with Plan of Care Patient          G-Codes - January 21, 2016 1834    Functional Assessment Tool Used FOTO   Functional Limitation Carrying, moving and handling objects   Carrying, Moving and Handling Objects Current Status [phone]) At least 60 percent but less than 80 percent impaired, limited or restricted   Carrying, Moving and Handling Objects Goal Status [id number]) At least 1 percent but less than 20 percent impaired, limited or restricted       Problem List Patient Active Problem List   Diagnosis Date Noted  . Abdominal pain, epigastric 10/29/2015  . Prolapsed internal hemorrhoids, grade 3 08/12/2015  . IT band syndrome 07/23/2015  . Partial tear of rotator cuff 12/04/2014  . Well adult exam 07/03/2014  . Jock itch 07/03/2014  . Dyslipidemia 03/06/2014  . Ejection fraction   . Diverticulitis of colon without hemorrhage 08/31/2013  . Mitral regurgitation   . Tricuspid regurgitation   . HX: anticoagulation   . Abdominal distention 01/23/2013  . Diffuse anterior T-wave inversions 10/24/2012  . PPM-[employer] 07/16/2012  . 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 Shortness of breath   . Nausea   . Aortic  stenosis   . Routine health maintenance 04/11/2012  . Plantar fasciitis of right foot 04/04/2012  . Tumescence 02/23/2012  . Numbness of toes   . CAD (coronary artery disease)   . Hx of CABG   . Drug therapy   . HTN (hypertension)   . Drug intolerance   . Pulmonic stenosis   . Aortic insufficiency   . Oral  anticoagulation   . Hypokalemia   . A-fib ([place])   . QT prolongation on Tikosyn 03/26/2011  . PVC's (premature ventricular contractions)   . Carotid artery disease ([place])   . Elevated bilirubin   . THYROID STIMULATING HORMONE, ABNORMAL 04/01/2010  . DERANGEMENT OF MENISCUS NOT ELSEWHERE CLASSIFIED 12/03/2009  . Knee pain 12/03/2009  . BENIGN PROSTATIC HYPERTROPHY, WITH OBSTRUCTION 09/30/2009  . Disorders of bursae and tendons in shoulder region, unspecified 08/22/2009  . 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 ADENOMATOUS COLONIC POLYP 03/06/2008  . ANXIETY DEPRESSION 03/06/2008  . ESOPHAGITIS 03/06/2008  . ESOPHAGEAL STENOSIS 03/06/2008  . BARRETTS ESOPHAGUS 03/06/2008  . GASTRITIS, CHRONIC 03/06/2008  . DUODENITIS 03/06/2008  . DIVERTICULOSIS, COLON 03/06/2008  . PROSTATITIS, HX OF 03/06/2008  . OSTEOARTHRITIS, KNEE, LEFT 11/04/2007  . FOOT PAIN, LEFT 11/04/2007  . GERD 08/09/2007  . COLONIC POLYPS, HX OF 08/09/2007    Dollene Cleveland, PT 12/31/2015, 7:00 PM  [place], [name], [address] Phone: [phone]   Fax:  [phone]  Name: Login ED Entwisle MRN: [id number] Date of Birth: 12/29/1945

## 2016-01-01 ENCOUNTER — Ambulatory Visit: Payer: PPO | Attending: Orthopaedic Surgery | Admitting: Physical Therapy

## 2016-01-01 DIAGNOSIS — Z9889 Other specified postprocedural states: Secondary | ICD-10-CM | POA: Insufficient documentation

## 2016-01-01 DIAGNOSIS — R531 Weakness: Secondary | ICD-10-CM | POA: Diagnosis not present

## 2016-01-01 DIAGNOSIS — M25511 Pain in right shoulder: Secondary | ICD-10-CM | POA: Diagnosis not present

## 2016-01-01 NOTE — Patient Instructions (Signed)
Sling exercise from exercise drawer: grip wash cloth, Pendeulum, shoulder circles 5 to 10 X, 2 to 3 times a day.

## 2016-01-01 NOTE — Therapy (Signed)
[place], [name], [address] Phone: [phone]   Fax:  [phone]  Physical Therapy Treatment  Patient Details  Name: Russell Griffith MRN: [id number] Date of Birth: [birth year] Referring Provider: Durward Fortes  Encounter Date: 01/01/2016      PT End of Session - 01/01/16 1412    Visit Number 2   Number of Visits 24   Date for PT Re-Evaluation 02/21/16   PT Start Time 1335   PT Stop Time 1400   PT Time Calculation (min) 25 min   Activity Tolerance Patient tolerated treatment well      Past Medical History  Diagnosis Date  . CAD (coronary artery disease)     Myoview March 2014  . Hyperlipidemia     Low HDL  . GERD (gastroesophageal reflux disease)     Barrett's esophagus  . Colonic polyp   . Aortic stenosis     Mild, echo, April, 2014  . Carotid artery disease ([place])   . Elevated bilirubin     Mild chronic elevation, 2.0 January, 2011 stable  . BPH (benign prostatic hyperplasia)   . Chronic foot pain 2009    Resolved with use of orthotics  . Hemorrhoids     September, 2012  . 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 A-fib ([place])   . PVC's (premature ventricular contractions)   . Atrial flutter ([place])     Status post ablation  . HTN (hypertension)   . Primary osteoarthritis of left knee     Mild  . HX: anticoagulation   . Diverticulosis   . Tubular adenoma of colon   . Prolapsed internal hemorrhoids, grade 3 08/12/2015    Past Surgical History  Procedure Laterality Date  . Insert / replace / remove pacemaker  07/15/2012    initial placement  . Tonsillectomy  1956  . Coronary artery bypass graft  2000    CABG X5  . Radiofrequency  ablation for heart arrythmia    . Permanent pacemaker insertion N/A 07/15/2012    Procedure: PERMANENT PACEMAKER INSERTION;  Surgeon: Deboraha Sprang, MD;  Location: [facility] CATH LAB;  Service: Cardiovascular;  Laterality: N/A;  . Colonoscopy     . Esophagogastroduodenoscopy    . Hemorrhoid banding      There were no vitals filed for this visit.  Visit Diagnosis:  H/O repair of right rotator cuff  Pain in joint of right shoulder  Weakness      Subjective Assessment - 01/01/16 1335    Subjective sore constantly,  I'm used to it.  Not sleeping well.  The pillows helped a little.   Currently in Pain? Yes   Pain Score 4    Pain Location Shoulder   Pain Orientation Right                         [facility] Adult PT Treatment/Exercise - 01/01/16 0001    Self-Care   Self-Care --  precautions reviewed   Shoulder Exercises: Seated   Other Seated Exercises wrist extension/flexion AROM 30 X elbow 90 degrees and resting on pillow.     Other Seated Exercises circles 5 X each direction   Shoulder Exercises: Standing   Other Standing Exercises pendulums 10 x each    Shoulder Exercises: ROM/Strengthening   Other ROM/Strengthening Exercises passive long arm rotation 5x   Manual Therapy   Manual Therapy Soft tissue mobilization  Manual therapy comments shoulder, scapular area                PT Education - 01/01/16 1411    Education provided Yes   Education Details sling exercises, (not including elbow flexion )   Person(s) Educated Patient   Methods Explanation;Handout   Comprehension Verbalized understanding;Returned demonstration          PT Short Term Goals - 01/16/16 1828    PT SHORT TERM GOAL #1   Title Pt will be I with initial HEP for continued strengthening and mobility by 01/28/16.    Time 4   Period Weeks   Status New   PT SHORT TERM GOAL #2   Title Pt will be able to demo posture as it related to upper body, shoulder, and cervical spine by 01/28/16    Time 4   Period Weeks   Status New   PT SHORT TERM GOAL #3   Title R shoulder PROM will imrpove to 110 degrees pain-free by 01/28/16.    Time 4   Period Weeks   Status New           PT Long Term Goals - Jan 16, 2016 1829    PT LONG  TERM GOAL #1   Title R shoulder AROM will improve to 90 degrees pain-free for improved overhead reaching activities by 02/21/16.   Time 8   Period Weeks   Status New   PT LONG TERM GOAL #2   Title Pt will be able to lift 2# overhead x 10 reps without pain in order to return to pain free functional activities for lifting overhead by 02/21/16.   Time 8   Period Weeks   Status New   PT LONG TERM GOAL #3   Title FOTO score will improve from 71% limited to <40% to demo improved function and mobility by 02/21/16.    Time 8   Period Weeks   Status New               Plan - 01/01/16 1412    Clinical Impression Statement less than 2 weeks post op. beginning sling exercises .  No new goals met.   PT Next Visit Plan PROM until 01/20/2016   PT Home Exercise Plan pendeulum   Consulted and Agree with Plan of Care Patient          G-Codes - 01-16-2016 1834    Functional Assessment Tool Used FOTO   Functional Limitation Carrying, moving and handling objects   Carrying, Moving and Handling Objects Current Status [phone]) At least 60 percent but less than 80 percent impaired, limited or restricted   Carrying, Moving and Handling Objects Goal Status (B3419) At least 1 percent but less than 20 percent impaired, limited or restricted      Problem List Patient Active Problem List   Diagnosis Date Noted  . Abdominal pain, epigastric 10/29/2015  . Prolapsed internal hemorrhoids, grade 3 08/12/2015  . IT band syndrome 07/23/2015  . Partial tear of rotator cuff 12/04/2014  . Well adult exam 07/03/2014  . Jock itch 07/03/2014  . Dyslipidemia 03/06/2014  . Ejection fraction   . Diverticulitis of colon without hemorrhage 08/31/2013  . Mitral regurgitation   . Tricuspid regurgitation   . HX: anticoagulation   . Abdominal distention 01/23/2013  . Diffuse anterior T-wave inversions 10/24/2012  . PPM-[employer] 07/16/2012  . Sinus node dysfunction/chronotropic incompetence/posttermination  pausing 07/13/2012  . 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 Shortness of breath   .  Nausea   . Aortic stenosis   . Routine health maintenance 04/11/2012  . Plantar fasciitis of right foot 04/04/2012  . Tumescence 02/23/2012  . Numbness of toes   . CAD (coronary artery disease)   . Hx of CABG   . Drug therapy   . HTN (hypertension)   . Drug intolerance   . Pulmonic stenosis   . Aortic insufficiency   . Oral anticoagulation   . Hypokalemia   . A-fib ([place])   . QT prolongation on Tikosyn 03/26/2011  . PVC's (premature ventricular contractions)   . Carotid artery disease ([place])   . Elevated bilirubin   . THYROID STIMULATING HORMONE, ABNORMAL 04/01/2010  . DERANGEMENT OF MENISCUS NOT ELSEWHERE CLASSIFIED 12/03/2009  . Knee pain 12/03/2009  . BENIGN PROSTATIC HYPERTROPHY, WITH OBSTRUCTION 09/30/2009  . Disorders of bursae and tendons in shoulder region, unspecified 08/22/2009  . 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 ADENOMATOUS COLONIC POLYP 03/06/2008  . ANXIETY DEPRESSION 03/06/2008  . ESOPHAGITIS 03/06/2008  . ESOPHAGEAL STENOSIS 03/06/2008  . BARRETTS ESOPHAGUS 03/06/2008  . GASTRITIS, CHRONIC 03/06/2008  . DUODENITIS 03/06/2008  . DIVERTICULOSIS, COLON 03/06/2008  . PROSTATITIS, HX OF 03/06/2008  . OSTEOARTHRITIS, KNEE, LEFT 11/04/2007  . FOOT PAIN, LEFT 11/04/2007  . GERD 08/09/2007  . COLONIC POLYPS, HX OF 08/09/2007    HARRIS,KAREN 01/01/2016, 2:15 PM  [facility] [address] [place], [name], [address] Phone: [phone]   Fax:  [phone]  Name: Russell Griffith MRN: [id number] Date of Birth: 12/15/1945    Melvenia Needles, PTA 01/01/2016 2:15 PM Phone: [phone] Fax: [phone]

## 2016-01-06 ENCOUNTER — Ambulatory Visit: Payer: PPO | Admitting: Physical Therapy

## 2016-01-06 DIAGNOSIS — Z9889 Other specified postprocedural states: Secondary | ICD-10-CM

## 2016-01-06 DIAGNOSIS — M25511 Pain in right shoulder: Secondary | ICD-10-CM

## 2016-01-06 DIAGNOSIS — R531 Weakness: Secondary | ICD-10-CM

## 2016-01-06 NOTE — Therapy (Signed)
[place], [name], [address] Phone: [phone]   Fax:  [phone]  Physical Therapy Treatment  Patient Details  Name: Russell Griffith MRN: [id number] Date of Birth: Nov 27, 1946 Referring Provider: Durward Fortes  Encounter Date: 01/06/2016      PT End of Session - 01/06/16 1606    Visit Number 3   Number of Visits 24   Date for PT Re-Evaluation 02/21/16   PT Start Time 0345   PT Stop Time 0435   PT Time Calculation (min) 50 min      Past Medical History  Diagnosis Date  . CAD (coronary artery disease)     Myoview March 2014  . Hyperlipidemia     Low HDL  . GERD (gastroesophageal reflux disease)     Barrett's esophagus  . Colonic polyp   . Aortic stenosis     Mild, echo, April, 2014  . Carotid artery disease ([place])   . Elevated bilirubin     Mild chronic elevation, 2.0 January, 2011 stable  . BPH (benign prostatic hyperplasia)   . Chronic foot pain 2009    Resolved with use of orthotics  . Hemorrhoids     September, 2012  . 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 A-fib ([place])   . PVC's (premature ventricular contractions)   . Atrial flutter ([place])     Status post ablation  . HTN (hypertension)   . Primary osteoarthritis of left knee     Mild  . HX: anticoagulation   . Diverticulosis   . Tubular adenoma of colon   . Prolapsed internal hemorrhoids, grade 3 08/12/2015    Past Surgical History  Procedure Laterality Date  . Insert / replace / remove pacemaker  07/15/2012    initial placement  . Tonsillectomy  1956  . Coronary artery bypass graft  2000    CABG X5  . Radiofrequency  ablation for heart arrythmia    . Permanent pacemaker insertion N/A 07/15/2012    Procedure: PERMANENT PACEMAKER INSERTION;  Surgeon: Deboraha Sprang, MD;  Location: [facility] CATH LAB;  Service: Cardiovascular;  Laterality: N/A;  . Colonoscopy    . Esophagogastroduodenoscopy    . Hemorrhoid banding       There were no vitals filed for this visit.  Visit Diagnosis:  H/O repair of right rotator cuff  Pain in joint of right shoulder  Weakness      Subjective Assessment - 01/06/16 1550    Subjective still not sleeping well   Currently in Pain? Yes   Pain Score 3   average with sharp pains             [facility] PT Assessment - 01/06/16 0001    PROM   Right Shoulder Flexion 90 Degrees   Right Shoulder ABduction 80 Degrees   Right Shoulder External Rotation 25 Degrees  @ 30 degrees abduction                     [facility] Adult PT Treatment/Exercise - 01/06/16 0001    Modalities   Modalities Cryotherapy   Cryotherapy   Number Minutes Cryotherapy 15 Minutes   Cryotherapy Location Shoulder   Type of Cryotherapy --  vaso used no pressure for cryotherapy   Manual Therapy   Manual Therapy Soft tissue mobilization;Passive ROM   Passive ROM PROM R shoulder all directions.  PT Short Term Goals - 01/06/16 1630    PT SHORT TERM GOAL #1   Title Pt will be I with initial HEP for continued strengthening and mobility by 01/28/16.    Time 4   Period Weeks   Status On-going   PT SHORT TERM GOAL #2   Title Pt will be able to demo posture as it related to upper body, shoulder, and cervical spine by 01/28/16    Time 4   Period Weeks   Status On-going   PT SHORT TERM GOAL #3   Title R shoulder PROM will imrpove to 110 degrees pain-free by 01/28/16.    Time 4   Period Weeks   Status On-going           PT Long Term Goals - 12/31/15 1829    PT LONG TERM GOAL #1   Title R shoulder AROM will improve to 90 degrees pain-free for improved overhead reaching activities by 02/21/16.   Time 8   Period Weeks   Status New   PT LONG TERM GOAL #2   Title Pt will be able to lift 2# overhead x 10 reps without pain in order to return to pain free functional activities for lifting overhead by 02/21/16.   Time 8   Period Weeks   Status New   PT LONG TERM  GOAL #3   Title FOTO score will improve from 71% limited to <40% to demo improved function and mobility by 02/21/16.    Time 8   Period Weeks   Status New               Plan - 01/06/16 1628    Clinical Impression Statement Pt reports difficulty getting arm into shoulder height position for elbow ROM exercises issued last visit. Recommended pt perform elbow flexion in neutral seated or supine. PROM to tolerance with moderate increase in pain. Pt request vaso for pain control. Used with no pressure.    PT Next Visit Plan PROM until 01/20/2016        Problem List Patient Active Problem List   Diagnosis Date Noted  . Abdominal pain, epigastric 10/29/2015  . Prolapsed internal hemorrhoids, grade 3 08/12/2015  . IT band syndrome 07/23/2015  . Partial tear of rotator cuff 12/04/2014  . Well adult exam 07/03/2014  . Jock itch 07/03/2014  . Dyslipidemia 03/06/2014  . Ejection fraction   . Diverticulitis of colon without hemorrhage 08/31/2013  . Mitral regurgitation   . Tricuspid regurgitation   . HX: anticoagulation   . Abdominal distention 01/23/2013  . Diffuse anterior T-wave inversions 10/24/2012  . PPM-[employer] 07/16/2012  . 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 Shortness of breath   . Nausea   . Aortic stenosis   . Routine health maintenance 04/11/2012  . Plantar fasciitis of right foot 04/04/2012  . Tumescence 02/23/2012  . Numbness of toes   . CAD (coronary artery disease)   . Hx of CABG   . Drug therapy   . HTN (hypertension)   . Drug intolerance   . Pulmonic stenosis   . Aortic insufficiency   . Oral anticoagulation   . Hypokalemia   . A-fib ([place])   . QT prolongation on Tikosyn 03/26/2011  . PVC's (premature ventricular contractions)   . Carotid artery disease ([place])   . Elevated bilirubin   . THYROID STIMULATING HORMONE, ABNORMAL 04/01/2010  . DERANGEMENT OF MENISCUS NOT  ELSEWHERE CLASSIFIED 12/03/2009  .  Knee pain 12/03/2009  . BENIGN PROSTATIC HYPERTROPHY, WITH OBSTRUCTION 09/30/2009  . Disorders of bursae and tendons in shoulder region, unspecified 08/22/2009  . 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 ADENOMATOUS COLONIC POLYP 03/06/2008  . ANXIETY DEPRESSION 03/06/2008  . ESOPHAGITIS 03/06/2008  . ESOPHAGEAL STENOSIS 03/06/2008  . BARRETTS ESOPHAGUS 03/06/2008  . GASTRITIS, CHRONIC 03/06/2008  . DUODENITIS 03/06/2008  . DIVERTICULOSIS, COLON 03/06/2008  . PROSTATITIS, HX OF 03/06/2008  . OSTEOARTHRITIS, KNEE, LEFT 11/04/2007  . FOOT PAIN, LEFT 11/04/2007  . GERD 08/09/2007  . COLONIC POLYPS, HX OF 08/09/2007    Dorene Ar, PTA 01/06/2016, 4:32 PM  [facility] [address] [place], [name], [address] Phone: [phone]   Fax:  [phone]  Name: Hayward ED Girard MRN: [id number] Date of Birth: 1946-11-08

## 2016-01-07 DIAGNOSIS — M2550 Pain in unspecified joint: Secondary | ICD-10-CM | POA: Diagnosis not present

## 2016-01-08 ENCOUNTER — Ambulatory Visit: Payer: PPO | Admitting: Physical Therapy

## 2016-01-08 DIAGNOSIS — M25521 Pain in right elbow: Secondary | ICD-10-CM | POA: Diagnosis not present

## 2016-01-08 DIAGNOSIS — Z79899 Other long term (current) drug therapy: Secondary | ICD-10-CM | POA: Diagnosis not present

## 2016-01-08 DIAGNOSIS — M75111 Incomplete rotator cuff tear or rupture of right shoulder, not specified as traumatic: Secondary | ICD-10-CM | POA: Diagnosis not present

## 2016-01-09 ENCOUNTER — Ambulatory Visit: Payer: PPO

## 2016-01-10 ENCOUNTER — Ambulatory Visit: Payer: PPO | Admitting: Physical Therapy

## 2016-01-10 ENCOUNTER — Inpatient Hospital Stay (HOSPITAL_COMMUNITY): Admission: RE | Admit: 2016-01-10 | Payer: PPO | Source: Ambulatory Visit

## 2016-01-10 DIAGNOSIS — R531 Weakness: Secondary | ICD-10-CM

## 2016-01-10 DIAGNOSIS — M25511 Pain in right shoulder: Secondary | ICD-10-CM

## 2016-01-10 DIAGNOSIS — Z9889 Other specified postprocedural states: Secondary | ICD-10-CM

## 2016-01-10 NOTE — Therapy (Signed)
[place], [name], [address] Phone: [phone]   Fax:  [phone]  Physical Therapy Treatment  Patient Details  Name: Russell Griffith MRN: [id number] Date of Birth: 12-Dec-1945 Referring Provider: Durward Fortes  Encounter Date: 01/10/2016      PT End of Session - 01/10/16 0810    Visit Number 4   Number of Visits 24   Date for PT Re-Evaluation 02/21/16   PT Start Time 0758   PT Stop Time [id number]   PT Time Calculation (min) 59 min      Past Medical History  Diagnosis Date  . CAD (coronary artery disease)     Myoview March 2014  . Hyperlipidemia     Low HDL  . GERD (gastroesophageal reflux disease)     Barrett's esophagus  . Colonic polyp   . Aortic stenosis     Mild, echo, April, 2014  . Carotid artery disease ([place])   . Elevated bilirubin     Mild chronic elevation, 2.0 January, 2011 stable  . BPH (benign prostatic hyperplasia)   . Chronic foot pain 2009    Resolved with use of orthotics  . Hemorrhoids     September, 2012  . 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 A-fib ([place])   . PVC's (premature ventricular contractions)   . Atrial flutter ([place])     Status post ablation  . HTN (hypertension)   . Primary osteoarthritis of left knee     Mild  . HX: anticoagulation   . Diverticulosis   . Tubular adenoma of colon   . Prolapsed internal hemorrhoids, grade 3 08/12/2015    Past Surgical History  Procedure Laterality Date  . Insert / replace / remove pacemaker  07/15/2012    initial placement  . Tonsillectomy  1956  . Coronary artery bypass graft  2000    CABG X5  . Radiofrequency  ablation for heart arrythmia    . Permanent pacemaker insertion N/A 07/15/2012    Procedure: PERMANENT PACEMAKER INSERTION;  Surgeon: Deboraha Sprang, MD;  Location: [facility] CATH LAB;  Service: Cardiovascular;  Laterality: N/A;  . Colonoscopy    . Esophagogastroduodenoscopy    . Hemorrhoid  banding      There were no vitals filed for this visit.  Visit Diagnosis:  H/O repair of right rotator cuff  Pain in joint of right shoulder  Weakness      Subjective Assessment - 01/10/16 0807    Subjective My wrist and elbow swelled up by Tuesday night....the MD put me on a pain medication and med for Gout. My labs we negative for uric acid however the medicine did clear it all up except a little soreness at wrist and tightness at elbow.    Currently in Pain? Yes   Pain Score 2   up to 4/10    Pain Location Shoulder   Pain Orientation Right   Pain Descriptors / Indicators --  pulling, occassonal sharp    Pain Type Surgical pain   Aggravating Factors  getting out of bed.    Pain Relieving Factors ice rest            [facility] PT Assessment - 01/10/16 0001    PROM   Right Shoulder Flexion 100 Degrees   Right Shoulder ABduction 90 Degrees   Right Shoulder External Rotation 35 Degrees  @ 30 degrees abduction  [facility] Adult PT Treatment/Exercise - 01/10/16 0001    Shoulder Exercises: Standing   Other Standing Exercises pendulums 10 x each    Other Standing Exercises elbow flexion and extension with overpressure for flexion 3 x 20sec   Cryotherapy   Number Minutes Cryotherapy 15 Minutes   Cryotherapy Location Shoulder   Type of Cryotherapy Ice massage   Manual Therapy   Passive ROM PROM R shoulder all directions.                   PT Short Term Goals - 01/06/16 1630    PT SHORT TERM GOAL #1   Title Pt will be I with initial HEP for continued strengthening and mobility by 01/28/16.    Time 4   Period Weeks   Status On-going   PT SHORT TERM GOAL #2   Title Pt will be able to demo posture as it related to upper body, shoulder, and cervical spine by 01/28/16    Time 4   Period Weeks   Status On-going   PT SHORT TERM GOAL #3   Title R shoulder PROM will imrpove to 110 degrees pain-free by 01/28/16.    Time 4   Period Weeks    Status On-going           PT Long Term Goals - 12/31/15 1829    PT LONG TERM GOAL #1   Title R shoulder AROM will improve to 90 degrees pain-free for improved overhead reaching activities by 02/21/16.   Time 8   Period Weeks   Status New   PT LONG TERM GOAL #2   Title Pt will be able to lift 2# overhead x 10 reps without pain in order to return to pain free functional activities for lifting overhead by 02/21/16.   Time 8   Period Weeks   Status New   PT LONG TERM GOAL #3   Title FOTO score will improve from 71% limited to <40% to demo improved function and mobility by 02/21/16.    Time 8   Period Weeks   Status New               Plan - 01/10/16 [id number]    Clinical Impression Statement Pt reports inflammation and pain at wrist and elbow. MD gave gout medication and symptoms nearly resolved. Continued PROM per protocol with pt easily achieving 110 degrees flexion and 35 degrees ER, 90 degrees abduction. Ice after to reduce soreness. Pt sees MD Monday.   PT Next Visit Plan PROM until 01/20/2016; what did MD say ?        Problem List Patient Active Problem List   Diagnosis Date Noted  . Abdominal pain, epigastric 10/29/2015  . Prolapsed internal hemorrhoids, grade 3 08/12/2015  . IT band syndrome 07/23/2015  . Partial tear of rotator cuff 12/04/2014  . Well adult exam 07/03/2014  . Jock itch 07/03/2014  . Dyslipidemia 03/06/2014  . Ejection fraction   . Diverticulitis of colon without hemorrhage 08/31/2013  . Mitral regurgitation   . Tricuspid regurgitation   . HX: anticoagulation   . Abdominal distention 01/23/2013  . Diffuse anterior T-wave inversions 10/24/2012  . PPM-[employer] 07/16/2012  . 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 Shortness of breath   . Nausea   . Aortic stenosis   . Routine health maintenance 04/11/2012  . Plantar fasciitis of right foot 04/04/2012  . Tumescence  02/23/2012  . Numbness of toes   .  CAD (coronary artery disease)   . Hx of CABG   . Drug therapy   . HTN (hypertension)   . Drug intolerance   . Pulmonic stenosis   . Aortic insufficiency   . Oral anticoagulation   . Hypokalemia   . A-fib ([place])   . QT prolongation on Tikosyn 03/26/2011  . PVC's (premature ventricular contractions)   . Carotid artery disease ([place])   . Elevated bilirubin   . THYROID STIMULATING HORMONE, ABNORMAL 04/01/2010  . DERANGEMENT OF MENISCUS NOT ELSEWHERE CLASSIFIED 12/03/2009  . Knee pain 12/03/2009  . BENIGN PROSTATIC HYPERTROPHY, WITH OBSTRUCTION 09/30/2009  . Disorders of bursae and tendons in shoulder region, unspecified 08/22/2009  . 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 ADENOMATOUS COLONIC POLYP 03/06/2008  . ANXIETY DEPRESSION 03/06/2008  . ESOPHAGITIS 03/06/2008  . ESOPHAGEAL STENOSIS 03/06/2008  . BARRETTS ESOPHAGUS 03/06/2008  . GASTRITIS, CHRONIC 03/06/2008  . DUODENITIS 03/06/2008  . DIVERTICULOSIS, COLON 03/06/2008  . PROSTATITIS, HX OF 03/06/2008  . OSTEOARTHRITIS, KNEE, LEFT 11/04/2007  . FOOT PAIN, LEFT 11/04/2007  . GERD 08/09/2007  . COLONIC POLYPS, HX OF 08/09/2007    Dorene Ar , PTA  01/10/2016, 8:58 AM  [place], [name], [address] Phone: [phone]   Fax:  [phone]  Name: Sahej ED Stitts MRN: [id number] Date of Birth: [birth year]

## 2016-01-13 ENCOUNTER — Encounter: Payer: PPO | Admitting: Physical Therapy

## 2016-01-13 ENCOUNTER — Ambulatory Visit: Payer: PPO

## 2016-01-13 DIAGNOSIS — R531 Weakness: Secondary | ICD-10-CM

## 2016-01-13 DIAGNOSIS — Z9889 Other specified postprocedural states: Secondary | ICD-10-CM

## 2016-01-13 DIAGNOSIS — M25511 Pain in right shoulder: Secondary | ICD-10-CM

## 2016-01-13 NOTE — Therapy (Signed)
[place], [name], [address] Phone: [phone]   Fax:  [phone]  Physical Therapy Treatment  Patient Details  Name: Russell Griffith MRN: [id number] Date of Birth: [birth year] Referring Provider: Durward Fortes  Encounter Date: 01/13/2016      PT End of Session - 01/13/16 1230    Visit Number 5   Number of Visits 24   Date for PT Re-Evaluation 02/21/16   PT Start Time [id number]   PT Stop Time 1237   PT Time Calculation (min) 52 min   Activity Tolerance Patient tolerated treatment well   Behavior During Therapy [facility] for tasks assessed/performed      Past Medical History  Diagnosis Date  . CAD (coronary artery disease)     Myoview March 2014  . Hyperlipidemia     Low HDL  . GERD (gastroesophageal reflux disease)     Barrett's esophagus  . Colonic polyp   . Aortic stenosis     Mild, echo, April, 2014  . Carotid artery disease ([place])   . Elevated bilirubin     Mild chronic elevation, 2.0 January, 2011 stable  . BPH (benign prostatic hyperplasia)   . Chronic foot pain 2009    Resolved with use of orthotics  . Hemorrhoids     September, 2012  . 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 A-fib ([place])   . PVC's (premature ventricular contractions)   . Atrial flutter ([place])     Status post ablation  . HTN (hypertension)   . Primary osteoarthritis of left knee     Mild  . HX: anticoagulation   . Diverticulosis   . Tubular adenoma of colon   . Prolapsed internal hemorrhoids, grade 3 08/12/2015    Past Surgical History  Procedure Laterality Date  . Insert / replace / remove pacemaker  07/15/2012    initial placement  . Tonsillectomy  1956  . Coronary artery bypass graft  2000    CABG X5  . Radiofrequency  ablation for heart arrythmia    . Permanent pacemaker insertion N/A 07/15/2012    Procedure: PERMANENT PACEMAKER INSERTION;  Surgeon: Deboraha Sprang, MD;  Location: [facility] C[facility] LAB;   Service: Cardiovascular;  Laterality: N/A;  . Colonoscopy    . Esophagogastroduodenoscopy    . Hemorrhoid banding      There were no vitals filed for this visit.  Visit Diagnosis:  H/O repair of right rotator cuff  Pain in joint of right shoulder  Weakness      Subjective Assessment - 01/13/16 1152    Subjective 0/10 at rest. 3-4/10 with movement. Pt reports they did bloodwork and it ruled out gout, but wrist and elbow pain resolved completely with gout medication. Pt saw MD and he was pleased with progress. No new orders, but pt mentioned Dr Durward Fortes had mentioned "pulleys" soon. Initital order instructed PROM until 01/20/16.    Currently in Pain? No/denies   Pain Score 0-No pain  Pain with PROM up to 4/10    Pain Location Shoulder   Pain Orientation Right                         [facility] Adult PT Treatment/Exercise - 01/13/16 0001    Cryotherapy   Number Minutes Cryotherapy 10 Minutes   Cryotherapy Location Shoulder   Type of Cryotherapy Ice pack   Manual Therapy   Soft tissue mobilization  STM to teres and subscap region,  bicep, anterior/ post shoulder, upper trap.     Passive ROM PROM R shoulder all directions.                 PT Education - 01/13/16 1230    Education provided Yes   Education Details do not over-do. positioning with sling with padding to prevent rubbing during longer walks.    Person(s) Educated Patient   Methods Explanation   Comprehension Verbalized understanding          PT Short Term Goals - 01/13/16 1235    PT SHORT TERM GOAL #1   Title Pt will be I with initial HEP for continued strengthening and mobility by 01/28/16.    Time 4   Period Weeks   Status On-going   PT SHORT TERM GOAL #2   Title Pt will be able to demo posture as it related to upper body, shoulder, and cervical spine by 01/28/16    Time 4   Period Weeks   Status On-going   PT SHORT TERM GOAL #3   Title R shoulder PROM will imrpove to 110 degrees  pain-free by 01/28/16.    Time 4   Period Weeks   Status On-going           PT Long Term Goals - 01/13/16 1235    PT LONG TERM GOAL #1   Title R shoulder AROM will improve to 90 degrees pain-free for improved overhead reaching activities by 02/21/16.   Time 8   Period Weeks   Status On-going   PT LONG TERM GOAL #2   Title Pt will be able to lift 2# overhead x 10 reps without pain in order to return to pain free functional activities for lifting overhead by 02/21/16.   Time 8   Period Weeks   Status On-going   PT LONG TERM GOAL #3   Title FOTO score will improve from 71% limited to <40% to demo improved function and mobility by 02/21/16.    Time 8   Period Weeks   Status On-going               Plan - 01/13/16 1231    Clinical Impression Statement Pt has pain at end range PROM- instructed pt to verbalize pain during PROM and to avoid painful activities.  Pt verbalized understanding. Muscle gaurding prominent during PROM with constant VCs to relax during PROM. Muscle gaurding decreased during PROM following STM. Plan to continue PROM 1 more week per MD order with transition to gentle AAROM by 01/20/16.    PT Frequency 3x / week   PT Duration 8 weeks   PT Treatment/Interventions ADLs/Self Care Home Management;Cryotherapy;Moist Heat;Therapeutic exercise;Therapeutic activities;Functional mobility training;Neuromuscular re-education;Patient/family education;Manual techniques;Passive range of motion;Taping;Dry needling;Ultrasound   PT Next Visit Plan PROM until 01/20/2016;    Consulted and Agree with Plan of Care Patient        Problem List Patient Active Problem List   Diagnosis Date Noted  . Abdominal pain, epigastric 10/29/2015  . Prolapsed internal hemorrhoids, grade 3 08/12/2015  . IT band syndrome 07/23/2015  . Partial tear of rotator cuff 12/04/2014  . Well adult exam 07/03/2014  . Jock itch 07/03/2014  . Dyslipidemia 03/06/2014  . Ejection fraction   .  Diverticulitis of colon without hemorrhage 08/31/2013  . Mitral regurgitation   . Tricuspid regurgitation   . HX: anticoagulation   . Abdominal distention 01/23/2013  . Diffuse anterior T-wave inversions 10/24/2012  .  PPM-[employer] 07/16/2012  . 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 Shortness of breath   . Nausea   . Aortic stenosis   . Routine health maintenance 04/11/2012  . Plantar fasciitis of right foot 04/04/2012  . Tumescence 02/23/2012  . Numbness of toes   . CAD (coronary artery disease)   . Hx of CABG   . Drug therapy   . HTN (hypertension)   . Drug intolerance   . Pulmonic stenosis   . Aortic insufficiency   . Oral anticoagulation   . Hypokalemia   . A-fib ([place])   . QT prolongation on Tikosyn 03/26/2011  . PVC's (premature ventricular contractions)   . Carotid artery disease ([place])   . Elevated bilirubin   . THYROID STIMULATING HORMONE, ABNORMAL 04/01/2010  . DERANGEMENT OF MENISCUS NOT ELSEWHERE CLASSIFIED 12/03/2009  . Knee pain 12/03/2009  . BENIGN PROSTATIC HYPERTROPHY, WITH OBSTRUCTION 09/30/2009  . Disorders of bursae and tendons in shoulder region, unspecified 08/22/2009  . 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 ADENOMATOUS COLONIC POLYP 03/06/2008  . ANXIETY DEPRESSION 03/06/2008  . ESOPHAGITIS 03/06/2008  . ESOPHAGEAL STENOSIS 03/06/2008  . BARRETTS ESOPHAGUS 03/06/2008  . GASTRITIS, CHRONIC 03/06/2008  . DUODENITIS 03/06/2008  . DIVERTICULOSIS, COLON 03/06/2008  . PROSTATITIS, HX OF 03/06/2008  . OSTEOARTHRITIS, KNEE, LEFT 11/04/2007  . FOOT PAIN, LEFT 11/04/2007  . GERD 08/09/2007  . COLONIC POLYPS, HX OF 08/09/2007    Dollene Cleveland, PT 01/13/2016, 12:40 PM  [facility] [address] [place], [name], [address] Phone: [phone]   Fax:  [phone]  Name: Russell Griffith MRN:  [id number] Date of Birth: 03/01/1946

## 2016-01-15 ENCOUNTER — Ambulatory Visit: Payer: PPO | Admitting: Physical Therapy

## 2016-01-15 DIAGNOSIS — M25511 Pain in right shoulder: Secondary | ICD-10-CM

## 2016-01-15 DIAGNOSIS — Z9889 Other specified postprocedural states: Secondary | ICD-10-CM

## 2016-01-15 DIAGNOSIS — R531 Weakness: Secondary | ICD-10-CM

## 2016-01-15 NOTE — Therapy (Signed)
[place], [name], [address] Phone: [phone]   Fax:  [phone]  Physical Therapy Treatment  Patient Details  Name: Russell Griffith MRN: [id number] Date of Birth: Sep 25, 1946 Referring Provider: Durward Fortes  Encounter Date: 01/15/2016      PT End of Session - 01/15/16 1505    Visit Number 6   Number of Visits 24   Date for PT Re-Evaluation 02/21/16   PT Start Time 0300   PT Stop Time 0400   PT Time Calculation (min) 60 min      Past Medical History  Diagnosis Date  . CAD (coronary artery disease)     Myoview March 2014  . Hyperlipidemia     Low HDL  . GERD (gastroesophageal reflux disease)     Barrett's esophagus  . Colonic polyp   . Aortic stenosis     Mild, echo, April, 2014  . Carotid artery disease ([place])   . Elevated bilirubin     Mild chronic elevation, 2.0 January, 2011 stable  . BPH (benign prostatic hyperplasia)   . Chronic foot pain 2009    Resolved with use of orthotics  . Hemorrhoids     September, 2012  . 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 A-fib ([place])   . PVC's (premature ventricular contractions)   . Atrial flutter ([place])     Status post ablation  . HTN (hypertension)   . Primary osteoarthritis of left knee     Mild  . HX: anticoagulation   . Diverticulosis   . Tubular adenoma of colon   . Prolapsed internal hemorrhoids, grade 3 08/12/2015    Past Surgical History  Procedure Laterality Date  . Insert / replace / remove pacemaker  07/15/2012    initial placement  . Tonsillectomy  1956  . Coronary artery bypass graft  2000    CABG X5  . Radiofrequency  ablation for heart arrythmia    . Permanent pacemaker insertion N/A 07/15/2012    Procedure: PERMANENT PACEMAKER INSERTION;  Surgeon: Deboraha Sprang, MD;  Location: [facility] CATH LAB;  Service: Cardiovascular;  Laterality: N/A;  . Colonoscopy    . Esophagogastroduodenoscopy    . Hemorrhoid  banding      There were no vitals filed for this visit.  Visit Diagnosis:  H/O repair of right rotator cuff  Pain in joint of right shoulder  Weakness      Subjective Assessment - 01/15/16 1504    Subjective I got about 4 hours asleep last night. Better positioning.    Currently in Pain? No/denies            [facility] PT Assessment - 01/15/16 0001    PROM   Right Shoulder Flexion 90 Degrees   Right Shoulder ABduction 80 Degrees   Right Shoulder External Rotation 25 Degrees  @ 30 degrees abduction                     [facility] Adult PT Treatment/Exercise - 01/15/16 0001    Shoulder Exercises: Stretch   Table Stretch - Flexion 5 reps;30 seconds   Table Stretch -Flexion Limitations facing forward and facing side ways- cues to lead with chest, keep motion passive   Cryotherapy   Number Minutes Cryotherapy 10 Minutes   Cryotherapy Location Shoulder   Type of Cryotherapy Ice pack   Manual Therapy   Soft tissue mobilization STM to teres and subscap region,  bicep, anterior/ post shoulder, upper trap.     Passive ROM PROM R shoulder all directions.                   PT Short Term Goals - 01/13/16 1235    PT SHORT TERM GOAL #1   Title Pt will be I with initial HEP for continued strengthening and mobility by 01/28/16.    Time 4   Period Weeks   Status On-going   PT SHORT TERM GOAL #2   Title Pt will be able to demo posture as it related to upper body, shoulder, and cervical spine by 01/28/16    Time 4   Period Weeks   Status On-going   PT SHORT TERM GOAL #3   Title R shoulder PROM will imrpove to 110 degrees pain-free by 01/28/16.    Time 4   Period Weeks   Status On-going           PT Long Term Goals - 01/13/16 1235    PT LONG TERM GOAL #1   Title R shoulder AROM will improve to 90 degrees pain-free for improved overhead reaching activities by 02/21/16.   Time 8   Period Weeks   Status On-going   PT LONG TERM GOAL #2   Title Pt will be able  to lift 2# overhead x 10 reps without pain in order to return to pain free functional activities for lifting overhead by 02/21/16.   Time 8   Period Weeks   Status On-going   PT LONG TERM GOAL #3   Title FOTO score will improve from 71% limited to <40% to demo improved function and mobility by 02/21/16.    Time 8   Period Weeks   Status On-going               Plan - 01/15/16 1605    Clinical Impression Statement Soft tisuue work prior to PROM as previous. Pt continues to be guarded and requires max cues to relax with PROM. He rates pain 4-5/10 with PROM. Educated pt in table slides for passive flexion stretch. Pt return demonstration correctly. ROM more limited today.    PT Next Visit Plan PROM until 01/20/2016; Begin AAROM next week; review table slides        Problem List Patient Active Problem List   Diagnosis Date Noted  . Abdominal pain, epigastric 10/29/2015  . Prolapsed internal hemorrhoids, grade 3 08/12/2015  . IT band syndrome 07/23/2015  . Partial tear of rotator cuff 12/04/2014  . Well adult exam 07/03/2014  . Jock itch 07/03/2014  . Dyslipidemia 03/06/2014  . Ejection fraction   . Diverticulitis of colon without hemorrhage 08/31/2013  . Mitral regurgitation   . Tricuspid regurgitation   . HX: anticoagulation   . Abdominal distention 01/23/2013  . Diffuse anterior T-wave inversions 10/24/2012  . PPM-[employer] 07/16/2012  . 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 Shortness of breath   . Nausea   . Aortic stenosis   . Routine health maintenance 04/11/2012  . Plantar fasciitis of right foot 04/04/2012  . Tumescence 02/23/2012  . Numbness of toes   . CAD (coronary artery disease)   . Hx of CABG   . Drug therapy   . HTN (hypertension)   . Drug intolerance   . Pulmonic stenosis   . Aortic insufficiency   . Oral anticoagulation   . Hypokalemia   . A-fib ([place])   .  QT  prolongation on Tikosyn 03/26/2011  . PVC's (premature ventricular contractions)   . Carotid artery disease ([place])   . Elevated bilirubin   . THYROID STIMULATING HORMONE, ABNORMAL 04/01/2010  . DERANGEMENT OF MENISCUS NOT ELSEWHERE CLASSIFIED 12/03/2009  . Knee pain 12/03/2009  . BENIGN PROSTATIC HYPERTROPHY, WITH OBSTRUCTION 09/30/2009  . Disorders of bursae and tendons in shoulder region, unspecified 08/22/2009  . 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 ADENOMATOUS COLONIC POLYP 03/06/2008  . ANXIETY DEPRESSION 03/06/2008  . ESOPHAGITIS 03/06/2008  . ESOPHAGEAL STENOSIS 03/06/2008  . BARRETTS ESOPHAGUS 03/06/2008  . GASTRITIS, CHRONIC 03/06/2008  . DUODENITIS 03/06/2008  . DIVERTICULOSIS, COLON 03/06/2008  . PROSTATITIS, HX OF 03/06/2008  . OSTEOARTHRITIS, KNEE, LEFT 11/04/2007  . FOOT PAIN, LEFT 11/04/2007  . GERD 08/09/2007  . COLONIC POLYPS, HX OF 08/09/2007    Dorene Ar, PTA 01/15/2016, 4:10 PM  [facility] [address] [place], [name], [address] Phone: [phone]   Fax:  [phone]  Name: Russell Griffith MRN: [id number] Date of Birth: [birth year]

## 2016-01-16 ENCOUNTER — Inpatient Hospital Stay (HOSPITAL_COMMUNITY): Admission: RE | Admit: 2016-01-16 | Payer: PPO | Source: Ambulatory Visit

## 2016-01-17 ENCOUNTER — Ambulatory Visit: Payer: PPO | Admitting: Physical Therapy

## 2016-01-17 DIAGNOSIS — Z9889 Other specified postprocedural states: Secondary | ICD-10-CM | POA: Diagnosis not present

## 2016-01-17 DIAGNOSIS — R531 Weakness: Secondary | ICD-10-CM

## 2016-01-17 DIAGNOSIS — M25511 Pain in right shoulder: Secondary | ICD-10-CM

## 2016-01-17 NOTE — Patient Instructions (Signed)
ROM: External Rotation    CLOSER TO TABLE SO ELBOW IS CLOSER TO YOUR SIDE  Keeping left forearm palm down on table, bend forward at waist until stretch is felt. Hold __30__ seconds. Repeat __3__ times per set. Do __2__ sets per session. Do ___2_ sessions per day.  ROM: External / Internal Rotation - Wand    KEEP RIGHT ELBOW CLOSE TO SIDE, PUSH HAND OUT TO SIDE USING STICK Holding wand with left hand palm up, push out from body with other hand, palm down. Keep both elbows bent. When stretch is felt, hold __30_ seconds. Repeat to other side, leading with same hand. Keep elbows bent. Repeat _3___ times per set. Do ___1_ sets per session. Do __2-3__ sessions per day.  Table Top Stretch (Static)    Sitting at table, slide arm across table. Hold __30_ seconds. Repeat ___3 times  Do __2_ sessions per day.   Copyright  VHI. All rights reserved.

## 2016-01-17 NOTE — Therapy (Signed)
[place], [name], [address] Phone: [phone]   Fax:  [phone]  Physical Therapy Treatment  Patient Details  Name: Russell Griffith MRN: [id number] Date of Birth: 11/13/1946 Referring Provider: Durward Fortes  Encounter Date: 01/17/2016      PT End of Session - 01/17/16 0850    Visit Number 7   Number of Visits 24   Date for PT Re-Evaluation 02/21/16   PT Start Time 0848   PT Stop Time 0948   PT Time Calculation (min) 60 min      Past Medical History  Diagnosis Date  . CAD (coronary artery disease)     Myoview March 2014  . Hyperlipidemia     Low HDL  . GERD (gastroesophageal reflux disease)     Barrett's esophagus  . Colonic polyp   . Aortic stenosis     Mild, echo, April, 2014  . Carotid artery disease ([place])   . Elevated bilirubin     Mild chronic elevation, 2.0 January, 2011 stable  . BPH (benign prostatic hyperplasia)   . Chronic foot pain 2009    Resolved with use of orthotics  . Hemorrhoids     September, 2012  . 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 A-fib ([place])   . PVC's (premature ventricular contractions)   . Atrial flutter ([place])     Status post ablation  . HTN (hypertension)   . Primary osteoarthritis of left knee     Mild  . HX: anticoagulation   . Diverticulosis   . Tubular adenoma of colon   . Prolapsed internal hemorrhoids, grade 3 08/12/2015    Past Surgical History  Procedure Laterality Date  . Insert / replace / remove pacemaker  07/15/2012    initial placement  . Tonsillectomy  1956  . Coronary artery bypass graft  2000    CABG X5  . Radiofrequency  ablation for heart arrythmia    . Permanent pacemaker insertion N/A 07/15/2012    Procedure: PERMANENT PACEMAKER INSERTION;  Surgeon: Deboraha Sprang, MD;  Location: [facility] CATH LAB;  Service: Cardiovascular;  Laterality: N/A;  . Colonoscopy    . Esophagogastroduodenoscopy    . Hemorrhoid  banding      There were no vitals filed for this visit.  Visit Diagnosis:  H/O repair of right rotator cuff  Pain in joint of right shoulder  Weakness      Subjective Assessment - 01/17/16 0849    Subjective I got 6-7 hours night before last but then did not sleep any last night.    Currently in Pain? No/denies            [facility] PT Assessment - 01/17/16 0001    PROM   Right Shoulder Flexion 100 Degrees   Right Shoulder ABduction 90 Degrees   Right Shoulder Internal Rotation 50 Degrees                     [facility] Adult PT Treatment/Exercise - 01/17/16 0001    Shoulder Exercises: Stretch   Table Stretch - Flexion 5 reps;30 seconds   Table Stretch -Flexion Limitations facing table using bilateral UE -cues to hold stretch with no more than 4-5/10 pain   Table Stretch - External Rotation 2 reps;30 seconds   Cryotherapy   Number Minutes Cryotherapy 15 Minutes   Cryotherapy Location Shoulder   Type of Cryotherapy Ice pack   Manual Therapy  Manual Therapy Joint mobilization   Joint Mobilization grade 2 gentle inferior and AP glides   Passive ROM PROM R shoulder all directions.                 PT Education - 01/17/16 1009    Education provided Yes   Education Details Passive ER, and flexion   Person(s) Educated Patient   Methods Explanation;Handout   Comprehension Verbalized understanding          PT Short Term Goals - 01/13/16 1235    PT SHORT TERM GOAL #1   Title Pt will be I with initial HEP for continued strengthening and mobility by 01/28/16.    Time 4   Period Weeks   Status On-going   PT SHORT TERM GOAL #2   Title Pt will be able to demo posture as it related to upper body, shoulder, and cervical spine by 01/28/16    Time 4   Period Weeks   Status On-going   PT SHORT TERM GOAL #3   Title R shoulder PROM will imrpove to 110 degrees pain-free by 01/28/16.    Time 4   Period Weeks   Status On-going           PT Long Term Goals -  01/13/16 1235    PT LONG TERM GOAL #1   Title R shoulder AROM will improve to 90 degrees pain-free for improved overhead reaching activities by 02/21/16.   Time 8   Period Weeks   Status On-going   PT LONG TERM GOAL #2   Title Pt will be able to lift 2# overhead x 10 reps without pain in order to return to pain free functional activities for lifting overhead by 02/21/16.   Time 8   Period Weeks   Status On-going   PT LONG TERM GOAL #3   Title FOTO score will improve from 71% limited to <40% to demo improved function and mobility by 02/21/16.    Time 8   Period Weeks   Status On-going               Plan - 01/17/16 [id number]    Clinical Impression Statement Pt is 4 weeks s/p partial RTC tear and bicep tendon repair. Pt issued Passive stretches for HEP to maximize ROM outcomes including table slide, ER on table with elbow close to side and ER using dowel to passively stretch ER. Pt education on importance of no more than moderate pain with stretching and 30 second hold times to maximize ROM as well as keeping elbow near side with stretches. Pt verbalizes understanding. Flexion rom slowly improving.    PT Next Visit Plan PROM until 01/20/2016; Begin AAROM next week; review PASSIVE STRETCHES        Problem List Patient Active Problem List   Diagnosis Date Noted  . Abdominal pain, epigastric 10/29/2015  . Prolapsed internal hemorrhoids, grade 3 08/12/2015  . IT band syndrome 07/23/2015  . Partial tear of rotator cuff 12/04/2014  . Well adult exam 07/03/2014  . Jock itch 07/03/2014  . Dyslipidemia 03/06/2014  . Ejection fraction   . Diverticulitis of colon without hemorrhage 08/31/2013  . Mitral regurgitation   . Tricuspid regurgitation   . HX: anticoagulation   . Abdominal distention 01/23/2013  . Diffuse anterior T-wave inversions 10/24/2012  . PPM-[employer] 07/16/2012  . 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 Lung  granuloma ([place])   . Shortness of breath   . Nausea   .  Aortic stenosis   . Routine health maintenance 04/11/2012  . Plantar fasciitis of right foot 04/04/2012  . Tumescence 02/23/2012  . Numbness of toes   . CAD (coronary artery disease)   . Hx of CABG   . Drug therapy   . HTN (hypertension)   . Drug intolerance   . Pulmonic stenosis   . Aortic insufficiency   . Oral anticoagulation   . Hypokalemia   . A-fib ([place])   . QT prolongation on Tikosyn 03/26/2011  . PVC's (premature ventricular contractions)   . Carotid artery disease ([place])   . Elevated bilirubin   . THYROID STIMULATING HORMONE, ABNORMAL 04/01/2010  . DERANGEMENT OF MENISCUS NOT ELSEWHERE CLASSIFIED 12/03/2009  . Knee pain 12/03/2009  . BENIGN PROSTATIC HYPERTROPHY, WITH OBSTRUCTION 09/30/2009  . Disorders of bursae and tendons in shoulder region, unspecified 08/22/2009  . 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 ADENOMATOUS COLONIC POLYP 03/06/2008  . ANXIETY DEPRESSION 03/06/2008  . ESOPHAGITIS 03/06/2008  . ESOPHAGEAL STENOSIS 03/06/2008  . BARRETTS ESOPHAGUS 03/06/2008  . GASTRITIS, CHRONIC 03/06/2008  . DUODENITIS 03/06/2008  . DIVERTICULOSIS, COLON 03/06/2008  . PROSTATITIS, HX OF 03/06/2008  . OSTEOARTHRITIS, KNEE, LEFT 11/04/2007  . FOOT PAIN, LEFT 11/04/2007  . GERD 08/09/2007  . COLONIC POLYPS, HX OF 08/09/2007    Dorene Ar, PTA 01/17/2016, 10:31 AM  [place], [name], [address] Phone: [phone]   Fax:  [phone]  Name: Russell Griffith MRN: [id number] Date of Birth: 1946/03/18

## 2016-01-18 LAB — CUP PACEART REMOTE DEVICE CHECK
Date Time Interrogation Session: 20170130063100
Implantable Lead Implant Date: 20130816
Implantable Lead Location: 753860
Implantable Lead Model: 5076
Implantable Lead Model: 5076
Lead Channel Impedance Value: 556 Ohm
Lead Channel Pacing Threshold Amplitude: 0.6 V
MDC IDC LEAD IMPLANT DT: 20130816
MDC IDC LEAD LOCATION: 753859
MDC IDC MSMT BATTERY REMAINING LONGEVITY: 114 mo
MDC IDC MSMT BATTERY REMAINING PERCENTAGE: 100 %
MDC IDC MSMT LEADCHNL RA PACING THRESHOLD PULSEWIDTH: 0.4 ms
MDC IDC MSMT LEADCHNL RV IMPEDANCE VALUE: 493 Ohm
MDC IDC SET LEADCHNL RA PACING AMPLITUDE: 2 V
MDC IDC SET LEADCHNL RV PACING AMPLITUDE: 2.4 V
MDC IDC SET LEADCHNL RV PACING PULSEWIDTH: 0.7 ms
MDC IDC SET LEADCHNL RV SENSING SENSITIVITY: 2.5 mV
MDC IDC STAT BRADY RA PERCENT PACED: 70 %
MDC IDC STAT BRADY RV PERCENT PACED: 14 %
Pulse Gen Serial Number: 111255

## 2016-01-20 ENCOUNTER — Ambulatory Visit: Payer: PPO

## 2016-01-20 ENCOUNTER — Encounter: Payer: PPO | Admitting: Physical Therapy

## 2016-01-20 DIAGNOSIS — Z9889 Other specified postprocedural states: Secondary | ICD-10-CM | POA: Diagnosis not present

## 2016-01-20 DIAGNOSIS — M25511 Pain in right shoulder: Secondary | ICD-10-CM

## 2016-01-20 DIAGNOSIS — R531 Weakness: Secondary | ICD-10-CM

## 2016-01-20 NOTE — Therapy (Signed)
[place], [name], [address] Phone: [phone]   Fax:  [phone]  Physical Therapy Treatment  Patient Details  Name: Russell Griffith MRN: [id number] Date of Birth: [birth year] Referring Provider: Durward Fortes  Encounter Date: 01/20/2016      PT End of Session - 01/20/16 1733    Visit Number 8   Number of Visits 24   Date for PT Re-Evaluation 02/21/16   PT Start Time 1550   PT Stop Time 1640   PT Time Calculation (min) 50 min   Activity Tolerance Patient tolerated treatment well   Behavior During Therapy [facility] for tasks assessed/performed      Past Medical History  Diagnosis Date  . CAD (coronary artery disease)     Myoview March 2014  . Hyperlipidemia     Low HDL  . GERD (gastroesophageal reflux disease)     Barrett's esophagus  . Colonic polyp   . Aortic stenosis     Mild, echo, April, 2014  . Carotid artery disease ([place])   . Elevated bilirubin     Mild chronic elevation, 2.0 January, 2011 stable  . BPH (benign prostatic hyperplasia)   . Chronic foot pain 2009    Resolved with use of orthotics  . Hemorrhoids     September, 2012  . 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 A-fib ([place])   . PVC's (premature ventricular contractions)   . Atrial flutter ([place])     Status post ablation  . HTN (hypertension)   . Primary osteoarthritis of left knee     Mild  . HX: anticoagulation   . Diverticulosis   . Tubular adenoma of colon   . Prolapsed internal hemorrhoids, grade 3 08/12/2015    Past Surgical History  Procedure Laterality Date  . Insert / replace / remove pacemaker  07/15/2012    initial placement  . Tonsillectomy  1956  . Coronary artery bypass graft  2000    CABG X5  . Radiofrequency  ablation for heart arrythmia    . Permanent pacemaker insertion N/A 07/15/2012    Procedure: PERMANENT PACEMAKER INSERTION;  Surgeon: Deboraha Sprang, MD;  Location: [facility] CATH LAB;   Service: Cardiovascular;  Laterality: N/A;  . Colonoscopy    . Esophagogastroduodenoscopy    . Hemorrhoid banding      There were no vitals filed for this visit.  Visit Diagnosis:  Pain in joint of right shoulder  H/O repair of right rotator cuff  Weakness      Subjective Assessment - 01/20/16 1554    Subjective Sleeping is getting better. Stretching with table slides has been helping. Pain-free at rest.    Currently in Pain? No/denies   Pain Location Shoulder   Pain Orientation Right            [facility] PT Assessment - 01/20/16 0001    PROM   Right Shoulder Flexion 120 Degrees   Right Shoulder ABduction 95 Degrees   Right Shoulder External Rotation 30 Degrees  @30  degrees ABD                     [facility] Adult PT Treatment/Exercise - 01/20/16 0001    Shoulder Exercises: Seated   Flexion AAROM   Flexion Limitations Table slides 30 secs x 3 reps.    Other Seated Exercises UE ranger flexion and ER, 10 x 2   Shoulder Exercises: Standing  Other Standing Exercises physioball on table: Flexion and ER 10 x 2   Other Standing Exercises cane ER  10 x 2   Cryotherapy   Number Minutes Cryotherapy 10 Minutes   Cryotherapy Location Shoulder   Type of Cryotherapy Ice pack   Manual Therapy   Manual Therapy Joint mobilization   Joint Mobilization grade 2-3 inferior posterior glides, scapular mobilization with pt sidlelying   Soft tissue mobilization STM to teres and subscap region,  bicep, anterior/ post shoulder, upper trap.     Passive ROM PROM R shoulder all directions.                 PT Education - 01/20/16 1733    Education provided Yes   Education Details continue table slides for HEP.    Person(s) Educated Patient   Methods Explanation   Comprehension Verbalized understanding          PT Short Term Goals - 01/20/16 1738    PT SHORT TERM GOAL #1   Title Pt will be I with initial HEP for continued strengthening and mobility by 01/28/16.     Time 4   Period Weeks   Status On-going   PT SHORT TERM GOAL #2   Title Pt will be able to demo posture as it related to upper body, shoulder, and cervical spine by 01/28/16    Time 4   Period Weeks   Status On-going   PT SHORT TERM GOAL #3   Title R shoulder PROM will imrpove to 110 degrees pain-free by 01/28/16.    Time 4   Period Weeks   Status On-going           PT Long Term Goals - 01/20/16 1739    PT LONG TERM GOAL #1   Title R shoulder AROM will improve to 90 degrees pain-free for improved overhead reaching activities by 02/21/16.   Time 8   Period Weeks   Status On-going   PT LONG TERM GOAL #2   Title Pt will be able to lift 2# overhead x 10 reps without pain in order to return to pain free functional activities for lifting overhead by 02/21/16.   Time 8   Period Weeks   Status On-going   PT LONG TERM GOAL #3   Title FOTO score will improve from 71% limited to <40% to demo improved function and mobility by 02/21/16.    Time 8   Period Weeks   Status On-going               Plan - 01/20/16 1734    Clinical Impression Statement Improved PROM flexion to 120 today following manuals and mobilization of shoulder and scapula. Pt is compliant with HEP including table slides with good effects noted. Reviewed importance of no active movement. Began AAROM ther ex today per MD orders with good tolerance and pt required multiple cues for proper active assistive technique .    PT Next Visit Plan REview table slides. add AAROM ther ex to HEP. Progress AAROM ther ex.    PT Home Exercise Plan table slides   Consulted and Agree with Plan of Care Patient        Problem List Patient Active Problem List   Diagnosis Date Noted  . Abdominal pain, epigastric 10/29/2015  . Prolapsed internal hemorrhoids, grade 3 08/12/2015  . IT band syndrome 07/23/2015  . Partial tear of rotator cuff 12/04/2014  . Well adult exam 07/03/2014  . Jock itch 07/03/2014  .  Dyslipidemia 03/06/2014   . Ejection fraction   . Diverticulitis of colon without hemorrhage 08/31/2013  . Mitral regurgitation   . Tricuspid regurgitation   . HX: anticoagulation   . Abdominal distention 01/23/2013  . Diffuse anterior T-wave inversions 10/24/2012  . PPM-[employer] 07/16/2012  . 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 Shortness of breath   . Nausea   . Aortic stenosis   . Routine health maintenance 04/11/2012  . Plantar fasciitis of right foot 04/04/2012  . Tumescence 02/23/2012  . Numbness of toes   . CAD (coronary artery disease)   . Hx of CABG   . Drug therapy   . HTN (hypertension)   . Drug intolerance   . Pulmonic stenosis   . Aortic insufficiency   . Oral anticoagulation   . Hypokalemia   . A-fib ([place])   . QT prolongation on Tikosyn 03/26/2011  . PVC's (premature ventricular contractions)   . Carotid artery disease ([place])   . Elevated bilirubin   . THYROID STIMULATING HORMONE, ABNORMAL 04/01/2010  . DERANGEMENT OF MENISCUS NOT ELSEWHERE CLASSIFIED 12/03/2009  . Knee pain 12/03/2009  . BENIGN PROSTATIC HYPERTROPHY, WITH OBSTRUCTION 09/30/2009  . Disorders of bursae and tendons in shoulder region, unspecified 08/22/2009  . 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 ADENOMATOUS COLONIC POLYP 03/06/2008  . ANXIETY DEPRESSION 03/06/2008  . ESOPHAGITIS 03/06/2008  . ESOPHAGEAL STENOSIS 03/06/2008  . BARRETTS ESOPHAGUS 03/06/2008  . GASTRITIS, CHRONIC 03/06/2008  . DUODENITIS 03/06/2008  . DIVERTICULOSIS, COLON 03/06/2008  . PROSTATITIS, HX OF 03/06/2008  . OSTEOARTHRITIS, KNEE, LEFT 11/04/2007  . FOOT PAIN, LEFT 11/04/2007  . GERD 08/09/2007  . COLONIC POLYPS, HX OF 08/09/2007    Dollene Cleveland , PT  01/20/2016, 5:40 PM  [facility] [address] [place], [name], [address] Phone: [phone]   Fax:  [phone]  Name: Russell ED  Griffith MRN: [id number] Date of Birth: January 24, 1946

## 2016-01-22 ENCOUNTER — Ambulatory Visit: Payer: PPO | Admitting: Physical Therapy

## 2016-01-22 DIAGNOSIS — R531 Weakness: Secondary | ICD-10-CM

## 2016-01-22 DIAGNOSIS — M25511 Pain in right shoulder: Secondary | ICD-10-CM

## 2016-01-22 DIAGNOSIS — Z9889 Other specified postprocedural states: Secondary | ICD-10-CM

## 2016-01-22 NOTE — Therapy (Signed)
[place], [name], [address] Phone: [phone]   Fax:  [phone]  Physical Therapy Treatment  Patient Details  Name: Cedar ED Sirois MRN: [id number] Date of Birth: [birth year] Referring Provider: Durward Fortes  Encounter Date: 01/22/2016      PT End of Session - 01/22/16 1811    Visit Number 9   Number of Visits 24   Date for PT Re-Evaluation 02/21/16   PT Start Time 1504   PT Stop Time 1545   PT Time Calculation (min) 41 min   Activity Tolerance Patient tolerated treatment well   Behavior During Therapy [facility] for tasks assessed/performed      Past Medical History  Diagnosis Date  . CAD (coronary artery disease)     Myoview March 2014  . Hyperlipidemia     Low HDL  . GERD (gastroesophageal reflux disease)     Barrett's esophagus  . Colonic polyp   . Aortic stenosis     Mild, echo, April, 2014  . Carotid artery disease ([place])   . Elevated bilirubin     Mild chronic elevation, 2.0 January, 2011 stable  . BPH (benign prostatic hyperplasia)   . Chronic foot pain 2009    Resolved with use of orthotics  . Hemorrhoids     September, 2012  . 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 A-fib ([place])   . PVC's (premature ventricular contractions)   . Atrial flutter ([place])     Status post ablation  . HTN (hypertension)   . Primary osteoarthritis of left knee     Mild  . HX: anticoagulation   . Diverticulosis   . Tubular adenoma of colon   . Prolapsed internal hemorrhoids, grade 3 08/12/2015    Past Surgical History  Procedure Laterality Date  . Insert / replace / remove pacemaker  07/15/2012    initial placement  . Tonsillectomy  1956  . Coronary artery bypass graft  2000    CABG X5  . Radiofrequency  ablation for heart arrythmia    . Permanent pacemaker insertion N/A 07/15/2012    Procedure: PERMANENT PACEMAKER INSERTION;  Surgeon: Deboraha Sprang, MD;  Location: [facility] CATH LAB;   Service: Cardiovascular;  Laterality: N/A;  . Colonoscopy    . Esophagogastroduodenoscopy    . Hemorrhoid banding      There were no vitals filed for this visit.  Visit Diagnosis:  Pain in joint of right shoulder  H/O repair of right rotator cuff  Weakness      Subjective Assessment - 01/22/16 1505    Subjective Didn't get much sleep last night. No pain right now.    Currently in Pain? No/denies   Aggravating Factors  getting out of bed   Pain Relieving Factors keeping it stationary                         [facility] Adult PT Treatment/Exercise - 01/22/16 0001    Shoulder Exercises: Seated   External Rotation PROM   External Rotation Limitations 30 degrees   Flexion AAROM   Flexion Limitations 125 degres   Abduction PROM   Other Seated Exercises UE ranger flexion, scapular protraction/retraction, 10 x 2   Shoulder Exercises: Standing   Other Standing Exercises physioball on table: Flexion 1X10   Shoulder Exercises: Stretch   Table Stretch - Flexion 5 reps;30 seconds   Table Stretch -Flexion Limitations facing table  using bilateral UE -cues to hold stretch with no more than 4-5/10 pain   Cryotherapy   Number Minutes Cryotherapy 10 Minutes   Cryotherapy Location Shoulder   Type of Cryotherapy Ice pack   Manual Therapy   Manual Therapy Joint mobilization   Joint Mobilization grade 2-3 inferior,posterior, distraction glides   Soft tissue mobilization STM to teres and subscap region,  bicep, anterior/ post shoulder, upper trap.                  PT Education - 01/22/16 1810    Education provided Yes   Education Details continuation of HEP, reviewed technique for wand ER and table flexion   Person(s) Educated Patient   Methods Explanation;Demonstration   Comprehension Verbalized understanding          PT Short Term Goals - 01/20/16 1738    PT SHORT TERM GOAL #1   Title Pt will be I with initial HEP for continued strengthening and mobility by  01/28/16.    Time 4   Period Weeks   Status On-going   PT SHORT TERM GOAL #2   Title Pt will be able to demo posture as it related to upper body, shoulder, and cervical spine by 01/28/16    Time 4   Period Weeks   Status On-going   PT SHORT TERM GOAL #3   Title R shoulder PROM will imrpove to 110 degrees pain-free by 01/28/16.    Time 4   Period Weeks   Status On-going           PT Long Term Goals - 01/20/16 1739    PT LONG TERM GOAL #1   Title R shoulder AROM will improve to 90 degrees pain-free for improved overhead reaching activities by 02/21/16.   Time 8   Period Weeks   Status On-going   PT LONG TERM GOAL #2   Title Pt will be able to lift 2# overhead x 10 reps without pain in order to return to pain free functional activities for lifting overhead by 02/21/16.   Time 8   Period Weeks   Status On-going   PT LONG TERM GOAL #3   Title FOTO score will improve from 71% limited to <40% to demo improved function and mobility by 02/21/16.    Time 8   Period Weeks   Status On-going               Plan - 01/22/16 1812    Clinical Impression Statement Patient tolerating session without complaint, progressing as tolerated. Patient having difficulty with scapular motion, needing verbal and manual cues. Will continue with skilled PT to progress.    PT Next Visit Plan Review table slides. add AAROM ther ex to HEP. Progress AAROM ther ex.    PT Home Exercise Plan Review AAROM for HEP progression   Consulted and Agree with Plan of Care Patient        Problem List Patient Active Problem List   Diagnosis Date Noted  . Abdominal pain, epigastric 10/29/2015  . Prolapsed internal hemorrhoids, grade 3 08/12/2015  . IT band syndrome 07/23/2015  . Partial tear of rotator cuff 12/04/2014  . Well adult exam 07/03/2014  . Jock itch 07/03/2014  . Dyslipidemia 03/06/2014  . Ejection fraction   . Diverticulitis of colon without hemorrhage 08/31/2013  . Mitral regurgitation   .  Tricuspid regurgitation   . HX: anticoagulation   . Abdominal distention 01/23/2013  . Diffuse anterior T-wave inversions 10/24/2012  .  PPM-[employer] 07/16/2012  . 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 Shortness of breath   . Nausea   . Aortic stenosis   . Routine health maintenance 04/11/2012  . Plantar fasciitis of right foot 04/04/2012  . Tumescence 02/23/2012  . Numbness of toes   . CAD (coronary artery disease)   . Hx of CABG   . Drug therapy   . HTN (hypertension)   . Drug intolerance   . Pulmonic stenosis   . Aortic insufficiency   . Oral anticoagulation   . Hypokalemia   . A-fib ([place])   . QT prolongation on Tikosyn 03/26/2011  . PVC's (premature ventricular contractions)   . Carotid artery disease ([place])   . Elevated bilirubin   . THYROID STIMULATING HORMONE, ABNORMAL 04/01/2010  . DERANGEMENT OF MENISCUS NOT ELSEWHERE CLASSIFIED 12/03/2009  . Knee pain 12/03/2009  . BENIGN PROSTATIC HYPERTROPHY, WITH OBSTRUCTION 09/30/2009  . Disorders of bursae and tendons in shoulder region, unspecified 08/22/2009  . 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 ADENOMATOUS COLONIC POLYP 03/06/2008  . ANXIETY DEPRESSION 03/06/2008  . ESOPHAGITIS 03/06/2008  . ESOPHAGEAL STENOSIS 03/06/2008  . BARRETTS ESOPHAGUS 03/06/2008  . GASTRITIS, CHRONIC 03/06/2008  . DUODENITIS 03/06/2008  . DIVERTICULOSIS, COLON 03/06/2008  . PROSTATITIS, HX OF 03/06/2008  . OSTEOARTHRITIS, KNEE, LEFT 11/04/2007  . FOOT PAIN, LEFT 11/04/2007  . GERD 08/09/2007  . COLONIC POLYPS, HX OF 08/09/2007    Cassell Clement, PT, CSCS Pager [phone]  01/22/2016, 6:15 PM  [facility] [address] [place], [name], [address] Phone: [phone]   Fax:  [phone]  Name: Martinus ED Licata MRN: [id number] Date of Birth: 1945/12/11

## 2016-01-24 ENCOUNTER — Encounter: Payer: Self-pay | Admitting: Cardiology

## 2016-01-24 ENCOUNTER — Ambulatory Visit: Payer: PPO | Admitting: Physical Therapy

## 2016-01-24 DIAGNOSIS — Z9889 Other specified postprocedural states: Secondary | ICD-10-CM | POA: Diagnosis not present

## 2016-01-24 DIAGNOSIS — M25511 Pain in right shoulder: Secondary | ICD-10-CM

## 2016-01-24 DIAGNOSIS — R531 Weakness: Secondary | ICD-10-CM

## 2016-01-24 NOTE — Therapy (Signed)
[place], [name], [address] Phone: [phone]   Fax:  [phone]  Physical Therapy Treatment  Patient Details  Name: Russell Griffith MRN: [id number] Date of Birth: 12/03/1945 Referring Provider: Durward Fortes  Encounter Date: 01/24/2016      PT End of Session - 01/24/16 1104    Visit Number 10   Number of Visits 24   Date for PT Re-Evaluation 02/21/16   PT Start Time 1102   PT Stop Time 1202   PT Time Calculation (min) 60 min      Past Medical History  Diagnosis Date  . CAD (coronary artery disease)     Myoview March 2014  . Hyperlipidemia     Low HDL  . GERD (gastroesophageal reflux disease)     Barrett's esophagus  . Colonic polyp   . Aortic stenosis     Mild, echo, April, 2014  . Carotid artery disease ([place])   . Elevated bilirubin     Mild chronic elevation, 2.0 January, 2011 stable  . BPH (benign prostatic hyperplasia)   . Chronic foot pain 2009    Resolved with use of orthotics  . Hemorrhoids     September, 2012  . 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 A-fib ([place])   . PVC's (premature ventricular contractions)   . Atrial flutter ([place])     Status post ablation  . HTN (hypertension)   . Primary osteoarthritis of left knee     Mild  . HX: anticoagulation   . Diverticulosis   . Tubular adenoma of colon   . Prolapsed internal hemorrhoids, grade 3 08/12/2015    Past Surgical History  Procedure Laterality Date  . Insert / replace / remove pacemaker  07/15/2012    initial placement  . Tonsillectomy  1956  . Coronary artery bypass graft  2000    CABG X5  . Radiofrequency  ablation for heart arrythmia    . Permanent pacemaker insertion N/A 07/15/2012    Procedure: PERMANENT PACEMAKER INSERTION;  Surgeon: Deboraha Sprang, MD;  Location: [facility] CATH LAB;  Service: Cardiovascular;  Laterality: N/A;  . Colonoscopy    . Esophagogastroduodenoscopy    . Hemorrhoid  banding      There were no vitals filed for this visit.  Visit Diagnosis:  Pain in joint of right shoulder  H/O repair of right rotator cuff  Weakness      Subjective Assessment - 01/24/16 1157    Subjective Had some new discomfort last night   Currently in Pain? No/denies            [facility] PT Assessment - 01/24/16 0001    Observation/Other Assessments   Focus on Therapeutic Outcomes (FOTO)  Foto score decreased- mostly due to pt reminder that he is not to use arm actively. 79% limted                     [facility] Adult PT Treatment/Exercise - 01/24/16 0001    Shoulder Exercises: Supine   Other Supine Exercises supine AAROM cane exercises- cues to keep pain free   Shoulder Exercises: Standing   Other Standing Exercises UE ranger on floor and on 6   inch step with mirror feedback -focusing scapular rhythm-pt given pole in left hand to perform simultaneous   Shoulder Exercises: Pulleys   Flexion 2 minutes   Flexion Limitations focusing of scapular rhythm   Cryotherapy  Number Minutes Cryotherapy 15 Minutes   Cryotherapy Location Shoulder   Type of Cryotherapy Ice pack   Manual Therapy   Passive ROM PROM R shoulder all directions.                   PT Short Term Goals - 01/24/16 1116    PT SHORT TERM GOAL #1   Title Pt will be I with initial HEP for continued strengthening and mobility by 01/28/16.    Baseline gave AAROM HEP today   Time 4   Period Weeks   Status On-going   PT SHORT TERM GOAL #2   Title Pt will be able to demo posture as it related to upper body, shoulder, and cervical spine by 01/28/16    Time 4   Status Achieved   PT SHORT TERM GOAL #3   Title R shoulder PROM will imrpove to 110 degrees pain-free by 01/28/16.    Baseline 5-6/10 pain   Time 4   Period Weeks   Status Partially Met           PT Long Term Goals - 01/20/16 1739    PT LONG TERM GOAL #1   Title R shoulder AROM will improve to 90 degrees pain-free for  improved overhead reaching activities by 02/21/16.   Time 8   Period Weeks   Status On-going   PT LONG TERM GOAL #2   Title Pt will be able to lift 2# overhead x 10 reps without pain in order to return to pain free functional activities for lifting overhead by 02/21/16.   Time 8   Period Weeks   Status On-going   PT LONG TERM GOAL #3   Title FOTO score will improve from 71% limited to <40% to demo improved function and mobility by 02/21/16.    Time 8   Period Weeks   Status On-going               Plan - 01/24/16 1118    Clinical Impression Statement Instructed pt in AAROM with cane for HEP. Began pulleys with scapular guidance as well as standing UE ranger focusing on scapular movement. Review of posture and scapualr depression with sitting. Pt verbalizes understanding. Pt has piulleys at home. Recommended pt use mirror feedback to avoid scapular dysfunction. STG#2  met, STG#3 partially met.    PT Next Visit Plan Watch scapular ryhthm with all AAROM. can we do scap bands?        Problem List Patient Active Problem List   Diagnosis Date Noted  . Abdominal pain, epigastric 10/29/2015  . Prolapsed internal hemorrhoids, grade 3 08/12/2015  . IT band syndrome 07/23/2015  . Partial tear of rotator cuff 12/04/2014  . Well adult exam 07/03/2014  . Jock itch 07/03/2014  . Dyslipidemia 03/06/2014  . Ejection fraction   . Diverticulitis of colon without hemorrhage 08/31/2013  . Mitral regurgitation   . Tricuspid regurgitation   . HX: anticoagulation   . Abdominal distention 01/23/2013  . Diffuse anterior T-wave inversions 10/24/2012  . PPM-[employer] 07/16/2012  . 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 Shortness of breath   . Nausea   . Aortic stenosis   . Routine health maintenance 04/11/2012  . Plantar fasciitis of right foot 04/04/2012  . Tumescence 02/23/2012  . Numbness of toes   . CAD  (coronary artery disease)   . Hx of CABG   . Drug therapy   .  HTN (hypertension)   . Drug intolerance   . Pulmonic stenosis   . Aortic insufficiency   . Oral anticoagulation   . Hypokalemia   . A-fib ([place])   . QT prolongation on Tikosyn 03/26/2011  . PVC's (premature ventricular contractions)   . Carotid artery disease ([place])   . Elevated bilirubin   . THYROID STIMULATING HORMONE, ABNORMAL 04/01/2010  . DERANGEMENT OF MENISCUS NOT ELSEWHERE CLASSIFIED 12/03/2009  . Knee pain 12/03/2009  . BENIGN PROSTATIC HYPERTROPHY, WITH OBSTRUCTION 09/30/2009  . Disorders of bursae and tendons in shoulder region, unspecified 08/22/2009  . 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 ADENOMATOUS COLONIC POLYP 03/06/2008  . ANXIETY DEPRESSION 03/06/2008  . ESOPHAGITIS 03/06/2008  . ESOPHAGEAL STENOSIS 03/06/2008  . BARRETTS ESOPHAGUS 03/06/2008  . GASTRITIS, CHRONIC 03/06/2008  . DUODENITIS 03/06/2008  . DIVERTICULOSIS, COLON 03/06/2008  . PROSTATITIS, HX OF 03/06/2008  . OSTEOARTHRITIS, KNEE, LEFT 11/04/2007  . FOOT PAIN, LEFT 11/04/2007  . GERD 08/09/2007  . COLONIC POLYPS, HX OF 08/09/2007    Dorene Ar, PTA 01/24/2016, 12:10 PM  [place], [name], [address] Phone: [phone]   Fax:  [phone]  Name: Russell Griffith MRN: [id number] Date of Birth: [birth year]

## 2016-01-24 NOTE — Patient Instructions (Signed)
SHOULDER: External Rotation - Supine (Cane)   Hold cane with both hands. Rotate arm away from body. Keep elbow on floor and next to body. _10__ reps per set, _2__ sets per session, 2 sessions per day  7___ days per week Add towel to keep elbow at side.  Copyright  VHI. All rights reserved.  Cane Horizontal - Supine   With straight arms holding cane above shoulders, bring cane out to right, center, out to left, and back to above head. Repeat _10_ times. 2 sets,  2 sessions   Copyright  VHI. All rights reserved.  Cane Exercise: Flexion   Lie on back, press cane up toward ceiling x 10, then. Keeping arms as straight as possible, lower cane toward floor beyond head. Hold _5___ seconds. Repeat _10___ times. Do 2 sets. Do __2__ sessions per day.  http://[email or web address].exer.[email or web address]   Copyright  VHI. All rights reserved.

## 2016-01-27 ENCOUNTER — Encounter: Payer: PPO | Admitting: Physical Therapy

## 2016-01-27 ENCOUNTER — Ambulatory Visit: Payer: PPO

## 2016-01-27 DIAGNOSIS — R531 Weakness: Secondary | ICD-10-CM

## 2016-01-27 DIAGNOSIS — Z9889 Other specified postprocedural states: Secondary | ICD-10-CM

## 2016-01-27 DIAGNOSIS — M25511 Pain in right shoulder: Secondary | ICD-10-CM

## 2016-01-27 NOTE — Therapy (Signed)
[place], [name], [address] Phone: [phone]   Fax:  [phone]  Physical Therapy Treatment and Progress Note  Patient Details  Name: Russell Griffith MRN: [id number] Date of Birth: 03/11/1946 Referring Provider: Durward Fortes  Encounter Date: 01/27/2016      PT End of Session - 01/27/16 1729    Visit Number 11   Number of Visits 24   Date for PT Re-Evaluation 02/21/16   PT Start Time 8891   PT Stop Time 1645   PT Time Calculation (min) 60 min   Activity Tolerance Patient tolerated treatment well   Behavior During Therapy [facility] for tasks assessed/performed      Past Medical History  Diagnosis Date  . CAD (coronary artery disease)     Myoview March 2014  . Hyperlipidemia     Low HDL  . GERD (gastroesophageal reflux disease)     Barrett's esophagus  . Colonic polyp   . Aortic stenosis     Mild, echo, April, 2014  . Carotid artery disease ([place])   . Elevated bilirubin     Mild chronic elevation, 2.0 January, 2011 stable  . BPH (benign prostatic hyperplasia)   . Chronic foot pain 2009    Resolved with use of orthotics  . Hemorrhoids     September, 2012  . 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 A-fib ([place])   . PVC's (premature ventricular contractions)   . Atrial flutter ([place])     Status post ablation  . HTN (hypertension)   . Primary osteoarthritis of left knee     Mild  . HX: anticoagulation   . Diverticulosis   . Tubular adenoma of colon   . Prolapsed internal hemorrhoids, grade 3 08/12/2015    Past Surgical History  Procedure Laterality Date  . Insert / replace / remove pacemaker  07/15/2012    initial placement  . Tonsillectomy  1956  . Coronary artery bypass graft  2000    CABG X5  . Radiofrequency  ablation for heart arrythmia    . Permanent pacemaker insertion N/A 07/15/2012    Procedure: PERMANENT PACEMAKER INSERTION;  Surgeon: Deboraha Sprang, MD;   Location: [facility] CATH LAB;  Service: Cardiovascular;  Laterality: N/A;  . Colonoscopy    . Esophagogastroduodenoscopy    . Hemorrhoid banding      There were no vitals filed for this visit.  Visit Diagnosis:  Pain in joint of right shoulder  H/O repair of right rotator cuff  Weakness      Subjective Assessment - 01/27/16 1612    Subjective Pt reports increased pain over the weekend after performing cane ther ex in flexion- stating most pain occured with lowering cane to chest (eccentric control). Pt reprots 40% improved with pain rating 2-3/10 at rest. Pt is still having difficulty sleeping at night. Still wearing sling at night and during day in community.     Currently in Pain? Yes   Pain Score 3    Pain Location Shoulder   Pain Orientation Right   Pain Type Surgical pain   Pain Frequency Intermittent            [facility] PT Assessment - 01/27/16 0001    Observation/Other Assessments   Focus on Therapeutic Outcomes (FOTO)  Foto score decreased- mostly due to pt reminder that he is not to use arm actively. 79% limted   PROM   Right Shoulder Flexion  115 Degrees   Right Shoulder ABduction 95 Degrees   Right Shoulder Internal Rotation 70 Degrees  @ 45 degrees ABD   Right Shoulder External Rotation 35 Degrees  @ 45 degrees ABD                     [facility] Adult PT Treatment/Exercise - 01/27/16 0001    Shoulder Exercises: Seated   External Rotation --   External Rotation Limitations --   Other Seated Exercises --   Shoulder Exercises: Therapy Ball   Flexion 10 reps   Flexion Limitations physioball flexion on table    Shoulder Exercises: Stretch   Table Stretch - Flexion 10 seconds   Table Stretch -Flexion Limitations x 10 reps    Table Stretch - Abduction 10 seconds   Table Stretch - ABduction Limitations x 10 reps   Table Stretch - External Rotation 10 seconds   Table Stretch - External Rotation Limitations x 10 reps   Cryotherapy   Number Minutes Cryotherapy  10 Minutes  post tx   Cryotherapy Location Shoulder   Type of Cryotherapy Ice pack   Manual Therapy   Manual Therapy Joint mobilization   Joint Mobilization grade 2-3 inferior,posterior, distraction glides   Passive ROM PROM R shoulder all directions.                   PT Short Term Goals - 01/27/16 1616    PT SHORT TERM GOAL #1   Title Pt will be I with initial HEP for continued strengthening and mobility by 01/28/16.    Time 4   Period Weeks   Status Achieved   PT SHORT TERM GOAL #2   Title Pt will be able to demo posture as it related to upper body, shoulder, and cervical spine by 01/28/16    Time 4   Period Weeks   Status Achieved   PT SHORT TERM GOAL #3   Title R shoulder PROM will imrpove to 110 degrees pain-free by 01/28/16.    Baseline 5-6/10 pain   Time 4   Period Weeks   Status Partially Met           PT Long Term Goals - 01/27/16 1617    PT LONG TERM GOAL #1   Title R shoulder AROM will improve to 90 degrees pain-free for improved overhead reaching activities by 02/21/16.   Time 8   Period Weeks   Status On-going   PT LONG TERM GOAL #2   Title Pt will be able to lift 2# overhead x 10 reps without pain in order to return to pain free functional activities for lifting overhead by 02/21/16.   Time 8   Period Weeks   Status On-going   PT LONG TERM GOAL #3   Title FOTO score will improve from 71% limited to <40% to demo improved function and mobility by 02/21/16.    Time 8   Period Weeks   Status On-going               Plan - 01/27/16 1731    Clinical Impression Statement Pt has been seen for 11 visits following R rotator cuff and biceps tendon repair on 12/19/15 by Dr Durward Fortes. Pt progressing toward goals; PROM progressing slowly as per objective measures, and beginning AAROM ther ex with good tolerance. Pt has achieved some STGs and all other goals continue to remain appropriate. FOTO score declined, but is, in part, due to education that pt is  not to be using UE for funtional activities, yet. Pt needs to be reminded not to "overdo it" with activities. Pt would benefit from continued outpt PT for 3 times a week until end of POC 02/21/16 to achieve goals and return to PLOF.    PT Frequency 3x / week   PT Duration 8 weeks   PT Treatment/Interventions ADLs/Self Care Home Management;Cryotherapy;Moist Heat;Therapeutic exercise;Therapeutic activities;Functional mobility training;Neuromuscular re-education;Patient/family education;Manual techniques;Passive range of motion;Taping;Dry needling;Ultrasound   PT Next Visit Plan Progress AAROM to tolerance   PT Home Exercise Plan Review AAROM for HEP progression   Consulted and Agree with Plan of Care Patient          G-Codes - 02-24-2016 1740    Functional Assessment Tool Used FOTO   Functional Limitation Carrying, moving and handling objects   Carrying, Moving and Handling Objects Current Status (A5697) At least 60 percent but less than 80 percent impaired, limited or restricted   Carrying, Moving and Handling Objects Goal Status (X4801) At least 1 percent but less than 20 percent impaired, limited or restricted      Problem List Patient Active Problem List   Diagnosis Date Noted  . Abdominal pain, epigastric 10/29/2015  . Prolapsed internal hemorrhoids, grade 3 08/12/2015  . IT band syndrome 07/23/2015  . Partial tear of rotator cuff 12/04/2014  . Well adult exam 07/03/2014  . Jock itch 07/03/2014  . Dyslipidemia 03/06/2014  . Ejection fraction   . Diverticulitis of colon without hemorrhage 08/31/2013  . Mitral regurgitation   . Tricuspid regurgitation   . HX: anticoagulation   . Abdominal distention 01/23/2013  . Diffuse anterior T-wave inversions 10/24/2012  . PPM-[employer] 07/16/2012  . 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 Shortness of breath   . Nausea   . Aortic stenosis   . Routine health  maintenance 04/11/2012  . Plantar fasciitis of right foot 04/04/2012  . Tumescence 02/23/2012  . Numbness of toes   . CAD (coronary artery disease)   . Hx of CABG   . Drug therapy   . HTN (hypertension)   . Drug intolerance   . Pulmonic stenosis   . Aortic insufficiency   . Oral anticoagulation   . Hypokalemia   . A-fib ([place])   . QT prolongation on Tikosyn 03/26/2011  . PVC's (premature ventricular contractions)   . Carotid artery disease ([place])   . Elevated bilirubin   . THYROID STIMULATING HORMONE, ABNORMAL 04/01/2010  . DERANGEMENT OF MENISCUS NOT ELSEWHERE CLASSIFIED 12/03/2009  . Knee pain 12/03/2009  . BENIGN PROSTATIC HYPERTROPHY, WITH OBSTRUCTION 09/30/2009  . Disorders of bursae and tendons in shoulder region, unspecified 08/22/2009  . 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 ADENOMATOUS COLONIC POLYP 03/06/2008  . ANXIETY DEPRESSION 03/06/2008  . ESOPHAGITIS 03/06/2008  . ESOPHAGEAL STENOSIS 03/06/2008  . BARRETTS ESOPHAGUS 03/06/2008  . GASTRITIS, CHRONIC 03/06/2008  . DUODENITIS 03/06/2008  . DIVERTICULOSIS, COLON 03/06/2008  . PROSTATITIS, HX OF 03/06/2008  . OSTEOARTHRITIS, KNEE, LEFT 11/04/2007  . FOOT PAIN, LEFT 11/04/2007  . GERD 08/09/2007  . COLONIC POLYPS, HX OF 08/09/2007   Physical Therapy Progress Note  Dates of Reporting Period: 12/31/15 to 02-24-2016  Objective Reports of Subjective Statement: see above  Objective Measurements: see above  Goal Update: STG #1 and #2 Met. All others reamil appropriate  Plan: continue 3 times a week to 02/21/16 ( end of POC)   Reason Skilled Services are Required: limited AROM,  PROM, strength, difficulty with functional activities, pain   Dollene Cleveland, PT 01/27/2016, 5:47 PM  [place], [name], [address] Phone: [phone]   Fax:  [phone]  Name: [place] ED Cristobal MRN: [id number] Date of Birth: 1946-01-20

## 2016-01-29 ENCOUNTER — Ambulatory Visit: Payer: PPO | Attending: Orthopaedic Surgery | Admitting: Physical Therapy

## 2016-01-29 DIAGNOSIS — M25511 Pain in right shoulder: Secondary | ICD-10-CM | POA: Diagnosis not present

## 2016-01-29 DIAGNOSIS — Z9889 Other specified postprocedural states: Secondary | ICD-10-CM

## 2016-01-29 DIAGNOSIS — R531 Weakness: Secondary | ICD-10-CM | POA: Diagnosis not present

## 2016-01-29 NOTE — Therapy (Signed)
[place], [name], [address] Phone: [phone]   Fax:  [phone]  Physical Therapy Treatment  Patient Details  Name: Russell Griffith MRN: [id number] Date of Birth: September 04, 1946 Referring Provider: Durward Fortes  Encounter Date: 01/29/2016      PT End of Session - 01/29/16 1515    Visit Number 12   Number of Visits 24   Date for PT Re-Evaluation 02/21/16   PT Start Time 0300   PT Stop Time 0400   PT Time Calculation (min) 60 min      Past Medical History  Diagnosis Date  . CAD (coronary artery disease)     Myoview March 2014  . Hyperlipidemia     Low HDL  . GERD (gastroesophageal reflux disease)     Barrett's esophagus  . Colonic polyp   . Aortic stenosis     Mild, echo, April, 2014  . Carotid artery disease ([place])   . Elevated bilirubin     Mild chronic elevation, 2.0 January, 2011 stable  . BPH (benign prostatic hyperplasia)   . Chronic foot pain 2009    Resolved with use of orthotics  . Hemorrhoids     September, 2012  . 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 A-fib ([place])   . PVC's (premature ventricular contractions)   . Atrial flutter ([place])     Status post ablation  . HTN (hypertension)   . Primary osteoarthritis of left knee     Mild  . HX: anticoagulation   . Diverticulosis   . Tubular adenoma of colon   . Prolapsed internal hemorrhoids, grade 3 08/12/2015    Past Surgical History  Procedure Laterality Date  . Insert / replace / remove pacemaker  07/15/2012    initial placement  . Tonsillectomy  1956  . Coronary artery bypass graft  2000    CABG X5  . Radiofrequency  ablation for heart arrythmia    . Permanent pacemaker insertion N/A 07/15/2012    Procedure: PERMANENT PACEMAKER INSERTION;  Surgeon: Deboraha Sprang, MD;  Location: [facility] CATH LAB;  Service: Cardiovascular;  Laterality: N/A;  . Colonoscopy    . Esophagogastroduodenoscopy    . Hemorrhoid  banding      There were no vitals filed for this visit.  Visit Diagnosis:  Pain in joint of right shoulder  H/O repair of right rotator cuff  Weakness      Subjective Assessment - 01/29/16 1603    Subjective No pain now. I sleep every other night. Not just the shoulder is keeping me up.    Currently in Pain? No/denies            [facility] PT Assessment - 01/29/16 0001    PROM   Right Shoulder Flexion 120 Degrees                     [facility] Adult PT Treatment/Exercise - 01/29/16 0001    Shoulder Exercises: Standing   Other Standing Exercises UE ranger on floor and on 6   inch and 8 inch  step with mirror feedback -focusing scapular rhythm  also on the wall level 12 with step forward, small horiz x10   Shoulder Exercises: Pulleys   Flexion 2 minutes   Flexion Limitations focusing of scapular rhythm   ABduction 2 minutes   ABduction Limitations scaptions   Shoulder Exercises: ROM/Strengthening   Other ROM/Strengthening Exercises standing wall slides  for flexion with LUE assist x10    Shoulder Exercises: Stretch   Other Shoulder Stretches door way stretch ER 3 x 30 single arm with elbow flexed   Cryotherapy   Number Minutes Cryotherapy 10 Minutes   Cryotherapy Location Shoulder   Type of Cryotherapy Ice pack   Manual Therapy   Joint Mobilization grade 2-3 inferior,posterior, distraction glides   Passive ROM R shoulder flexion and ER                  PT Short Term Goals - 01/27/16 1616    PT SHORT TERM GOAL #1   Title Pt will be I with initial HEP for continued strengthening and mobility by 01/28/16.    Time 4   Period Weeks   Status Achieved   PT SHORT TERM GOAL #2   Title Pt will be able to demo posture as it related to upper body, shoulder, and cervical spine by 01/28/16    Time 4   Period Weeks   Status Achieved   PT SHORT TERM GOAL #3   Title R shoulder PROM will imrpove to 110 degrees pain-free by 01/28/16.    Baseline 5-6/10 pain   Time  4   Period Weeks   Status Partially Met           PT Long Term Goals - 01/27/16 1617    PT LONG TERM GOAL #1   Title R shoulder AROM will improve to 90 degrees pain-free for improved overhead reaching activities by 02/21/16.   Time 8   Period Weeks   Status On-going   PT LONG TERM GOAL #2   Title Pt will be able to lift 2# overhead x 10 reps without pain in order to return to pain free functional activities for lifting overhead by 02/21/16.   Time 8   Period Weeks   Status On-going   PT LONG TERM GOAL #3   Title FOTO score will improve from 71% limited to <40% to demo improved function and mobility by 02/21/16.    Time 8   Period Weeks   Status On-going               Plan - 01/29/16 1604    Clinical Impression Statement Pt instructed in AAROM with UE ranger focusing scapular rhythm. Scapular rythm with pulleys improved. Instructed pt in wall slides and ER stretch at wall with cues to keep pain moderate or less.    PT Next Visit Plan Progress AAROM to tolerance, check wall slide and doorway stretch        Problem List Patient Active Problem List   Diagnosis Date Noted  . Abdominal pain, epigastric 10/29/2015  . Prolapsed internal hemorrhoids, grade 3 08/12/2015  . IT band syndrome 07/23/2015  . Partial tear of rotator cuff 12/04/2014  . Well adult exam 07/03/2014  . Jock itch 07/03/2014  . Dyslipidemia 03/06/2014  . Ejection fraction   . Diverticulitis of colon without hemorrhage 08/31/2013  . Mitral regurgitation   . Tricuspid regurgitation   . HX: anticoagulation   . Abdominal distention 01/23/2013  . Diffuse anterior T-wave inversions 10/24/2012  . PPM-[employer] 07/16/2012  . 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 Shortness of breath   . Nausea   . Aortic stenosis   . Routine health maintenance 04/11/2012  . Plantar fasciitis of right foot 04/04/2012  . Tumescence  02/23/2012  . Numbness of toes   . CAD (  coronary artery disease)   . Hx of CABG   . Drug therapy   . HTN (hypertension)   . Drug intolerance   . Pulmonic stenosis   . Aortic insufficiency   . Oral anticoagulation   . Hypokalemia   . A-fib ([place])   . QT prolongation on Tikosyn 03/26/2011  . PVC's (premature ventricular contractions)   . Carotid artery disease ([place])   . Elevated bilirubin   . THYROID STIMULATING HORMONE, ABNORMAL 04/01/2010  . DERANGEMENT OF MENISCUS NOT ELSEWHERE CLASSIFIED 12/03/2009  . Knee pain 12/03/2009  . BENIGN PROSTATIC HYPERTROPHY, WITH OBSTRUCTION 09/30/2009  . Disorders of bursae and tendons in shoulder region, unspecified 08/22/2009  . 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 ADENOMATOUS COLONIC POLYP 03/06/2008  . ANXIETY DEPRESSION 03/06/2008  . ESOPHAGITIS 03/06/2008  . ESOPHAGEAL STENOSIS 03/06/2008  . BARRETTS ESOPHAGUS 03/06/2008  . GASTRITIS, CHRONIC 03/06/2008  . DUODENITIS 03/06/2008  . DIVERTICULOSIS, COLON 03/06/2008  . PROSTATITIS, HX OF 03/06/2008  . OSTEOARTHRITIS, KNEE, LEFT 11/04/2007  . FOOT PAIN, LEFT 11/04/2007  . GERD 08/09/2007  . COLONIC POLYPS, HX OF 08/09/2007    Dorene Ar, PTA 01/29/2016, 4:06 PM  [facility] [address] [place], [name], [address] Phone: [phone]   Fax:  [phone]  Name: Russell Griffith MRN: [id number] Date of Birth: [birth year]

## 2016-01-31 ENCOUNTER — Ambulatory Visit: Payer: PPO | Admitting: Physical Therapy

## 2016-01-31 DIAGNOSIS — M25511 Pain in right shoulder: Secondary | ICD-10-CM | POA: Diagnosis not present

## 2016-01-31 DIAGNOSIS — Z9889 Other specified postprocedural states: Secondary | ICD-10-CM

## 2016-01-31 DIAGNOSIS — R531 Weakness: Secondary | ICD-10-CM

## 2016-01-31 NOTE — Therapy (Signed)
[place], [name], [address] Phone: [phone]   Fax:  [phone]  Physical Therapy Treatment  Patient Details  Name: Chevon ED Riss MRN: [id number] Date of Birth: August 13, 1946 Referring Provider: Durward Fortes  Encounter Date: 01/31/2016      PT End of Session - 01/31/16 1103    Visit Number 13   Number of Visits 24   Date for PT Re-Evaluation 02/21/16   PT Start Time 1100   PT Stop Time 1205   PT Time Calculation (min) 65 min      Past Medical History  Diagnosis Date  . CAD (coronary artery disease)     Myoview March 2014  . Hyperlipidemia     Low HDL  . GERD (gastroesophageal reflux disease)     Barrett's esophagus  . Colonic polyp   . Aortic stenosis     Mild, echo, April, 2014  . Carotid artery disease ([place])   . Elevated bilirubin     Mild chronic elevation, 2.0 January, 2011 stable  . BPH (benign prostatic hyperplasia)   . Chronic foot pain 2009    Resolved with use of orthotics  . Hemorrhoids     September, 2012  . 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 A-fib ([place])   . PVC's (premature ventricular contractions)   . Atrial flutter ([place])     Status post ablation  . HTN (hypertension)   . Primary osteoarthritis of left knee     Mild  . HX: anticoagulation   . Diverticulosis   . Tubular adenoma of colon   . Prolapsed internal hemorrhoids, grade 3 08/12/2015    Past Surgical History  Procedure Laterality Date  . Insert / replace / remove pacemaker  07/15/2012    initial placement  . Tonsillectomy  1956  . Coronary artery bypass graft  2000    CABG X5  . Radiofrequency  ablation for heart arrythmia    . Permanent pacemaker insertion N/A 07/15/2012    Procedure: PERMANENT PACEMAKER INSERTION;  Surgeon: Deboraha Sprang, MD;  Location: [facility] CATH LAB;  Service: Cardiovascular;  Laterality: N/A;  . Colonoscopy    . Esophagogastroduodenoscopy    . Hemorrhoid  banding      There were no vitals filed for this visit.  Visit Diagnosis:  Pain in joint of right shoulder  H/O repair of right rotator cuff  Weakness      Subjective Assessment - 01/31/16 1204    Subjective No pain. I am out of the sling            [facility] PT Assessment - 01/31/16 0001    ROM / Strength   AROM / PROM / Strength AROM   AROM   AROM Assessment Site Shoulder   Right/Left Shoulder Right   Right Shoulder Flexion 125 Degrees  AAROM   Right Shoulder External Rotation 28 Degrees  AAROM                     [facility] Adult PT Treatment/Exercise - 01/31/16 0001    Shoulder Exercises: Supine   Other Supine Exercises supine AAROM cane exercises- cues to keep pain free-    Shoulder Exercises: Standing   Extension Strengthening;Both;15 reps;Theraband   Theraband Level (Shoulder Extension) Level 1 (Yellow)   Row Strengthening;Theraband;15 reps   Theraband Level (Shoulder Row) Level 1 (Yellow)   Other Standing Exercises UE ranger on floor and on  6   inch and 8 inch  step with mirror feedback -focusing scapular rhythm  also on the wall level 12 with step forward, small horiz x10   Shoulder Exercises: Pulleys   Flexion 2 minutes   ABduction 2 minutes   ABduction Limitations scaptions   Shoulder Exercises: Stretch   Other Shoulder Stretches door way stretch ER 3 x 30 single arm with elbow flexed   Cryotherapy   Number Minutes Cryotherapy 10 Minutes   Cryotherapy Location Shoulder   Type of Cryotherapy Ice pack   Manual Therapy   Joint Mobilization grade 2-3 inferior,posterior, distraction glides   Passive ROM R shoulder flexion and ER                PT Education - 01/31/16 1108    Education provided Yes   Education Details Upper trap and levator stretch; yellow band rows and extension to neutral   Person(s) Educated Patient   Methods Explanation;Handout   Comprehension Verbalized understanding          PT Short Term Goals - 01/27/16  1616    PT SHORT TERM GOAL #1   Title Pt will be I with initial HEP for continued strengthening and mobility by 01/28/16.    Time 4   Period Weeks   Status Achieved   PT SHORT TERM GOAL #2   Title Pt will be able to demo posture as it related to upper body, shoulder, and cervical spine by 01/28/16    Time 4   Period Weeks   Status Achieved   PT SHORT TERM GOAL #3   Title R shoulder PROM will imrpove to 110 degrees pain-free by 01/28/16.    Baseline 5-6/10 pain   Time 4   Period Weeks   Status Partially Met           PT Long Term Goals - 01/27/16 1617    PT LONG TERM GOAL #1   Title R shoulder AROM will improve to 90 degrees pain-free for improved overhead reaching activities by 02/21/16.   Time 8   Period Weeks   Status On-going   PT LONG TERM GOAL #2   Title Pt will be able to lift 2# overhead x 10 reps without pain in order to return to pain free functional activities for lifting overhead by 02/21/16.   Time 8   Period Weeks   Status On-going   PT LONG TERM GOAL #3   Title FOTO score will improve from 71% limited to <40% to demo improved function and mobility by 02/21/16.    Time 8   Period Weeks   Status On-going               Plan - 01/31/16 1134    Clinical Impression Statement Pt is 6 weeks S/P RTC and biceps tendon repair. Instructed pt in standing scap rows and shoulder extension with yellow band. Added to HEP.   PT Next Visit Plan Progress AAROM to tolerance, check wall slide and doorway stretch, watch scap rhythm         Problem List Patient Active Problem List   Diagnosis Date Noted  . Abdominal pain, epigastric 10/29/2015  . Prolapsed internal hemorrhoids, grade 3 08/12/2015  . IT band syndrome 07/23/2015  . Partial tear of rotator cuff 12/04/2014  . Well adult exam 07/03/2014  . Jock itch 07/03/2014  . Dyslipidemia 03/06/2014  . Ejection fraction   . Diverticulitis of colon without hemorrhage 08/31/2013  . Mitral regurgitation   .  Tricuspid  regurgitation   . HX: anticoagulation   . Abdominal distention 01/23/2013  . Diffuse anterior T-wave inversions 10/24/2012  . PPM-[employer] 07/16/2012  . 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 Shortness of breath   . Nausea   . Aortic stenosis   . Routine health maintenance 04/11/2012  . Plantar fasciitis of right foot 04/04/2012  . Tumescence 02/23/2012  . Numbness of toes   . CAD (coronary artery disease)   . Hx of CABG   . Drug therapy   . HTN (hypertension)   . Drug intolerance   . Pulmonic stenosis   . Aortic insufficiency   . Oral anticoagulation   . Hypokalemia   . A-fib ([place])   . QT prolongation on Tikosyn 03/26/2011  . PVC's (premature ventricular contractions)   . Carotid artery disease ([place])   . Elevated bilirubin   . THYROID STIMULATING HORMONE, ABNORMAL 04/01/2010  . DERANGEMENT OF MENISCUS NOT ELSEWHERE CLASSIFIED 12/03/2009  . Knee pain 12/03/2009  . BENIGN PROSTATIC HYPERTROPHY, WITH OBSTRUCTION 09/30/2009  . Disorders of bursae and tendons in shoulder region, unspecified 08/22/2009  . 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 ADENOMATOUS COLONIC POLYP 03/06/2008  . ANXIETY DEPRESSION 03/06/2008  . ESOPHAGITIS 03/06/2008  . ESOPHAGEAL STENOSIS 03/06/2008  . BARRETTS ESOPHAGUS 03/06/2008  . GASTRITIS, CHRONIC 03/06/2008  . DUODENITIS 03/06/2008  . DIVERTICULOSIS, COLON 03/06/2008  . PROSTATITIS, HX OF 03/06/2008  . OSTEOARTHRITIS, KNEE, LEFT 11/04/2007  . FOOT PAIN, LEFT 11/04/2007  . GERD 08/09/2007  . COLONIC POLYPS, HX OF 08/09/2007    Dorene Ar, PTA 01/31/2016, 12:13 PM  [facility] [address] [place], [name], [address] Phone: [phone]   Fax:  [phone]  Name: Ruperto ED Stejskal MRN: [id number] Date of Birth: 06-02-1946

## 2016-01-31 NOTE — Patient Instructions (Addendum)
Levator Stretch   Grasp seat or sit on hand on side to be stretched. Turn head toward other side and look down. Use hand on head to gently stretch neck in that position. Hold __30__ seconds. Repeat on other side. Repeat __3__ times. Do __2__ sessions per day.  http://gt2.exer.us/30   Copyright  VHI. All rights reserved.  Side-Bending   One hand on opposite side of head, pull head to side as far as is comfortable. Stop if there is pain. Hold __30__ seconds. Repeat with other hand to other side. Repeat _3___ times. Do _2___ sessions per day.  EXTENSION: Standing - Resistance Band: Stable (Active)   Stand, right arm at side. Against yellow resistance band, draw arm backward, as far as possible, keeping elbow straight. Complete _2__ sets of _15__ repetitions. Perform _2__ sessions per day.    Copyright  VHI. All rights reserved.  Resistive Band Rowing   With resistive band anchored in door, grasp both ends. Keeping elbows bent, pull back, squeezing shoulder blades together. Hold __5__ seconds. Repeat _15___ times. Do __2__ sessions per day.

## 2016-02-03 ENCOUNTER — Ambulatory Visit: Payer: PPO

## 2016-02-03 DIAGNOSIS — R531 Weakness: Secondary | ICD-10-CM

## 2016-02-03 DIAGNOSIS — M25511 Pain in right shoulder: Secondary | ICD-10-CM | POA: Diagnosis not present

## 2016-02-03 DIAGNOSIS — Z9889 Other specified postprocedural states: Secondary | ICD-10-CM

## 2016-02-03 NOTE — Therapy (Signed)
[place], [name], [address] Phone: [phone]   Fax:  [phone]  Physical Therapy Treatment  Patient Details  Name: Russell Griffith MRN: [id number] Date of Birth: [birth year] Referring Provider: Durward Fortes  Encounter Date: 02/03/2016      PT End of Session - 02/03/16 1601    Visit Number 14   Number of Visits 24   Date for PT Re-Evaluation 02/21/16   PT Start Time 9390   PT Stop Time 1640   PT Time Calculation (min) 55 min   Activity Tolerance Patient tolerated treatment well   Behavior During Therapy [facility] for tasks assessed/performed      Past Medical History  Diagnosis Date  . CAD (coronary artery disease)     Myoview March 2014  . Hyperlipidemia     Low HDL  . GERD (gastroesophageal reflux disease)     Barrett's esophagus  . Colonic polyp   . Aortic stenosis     Mild, echo, April, 2014  . Carotid artery disease ([place])   . Elevated bilirubin     Mild chronic elevation, 2.0 January, 2011 stable  . BPH (benign prostatic hyperplasia)   . Chronic foot pain 2009    Resolved with use of orthotics  . Hemorrhoids     September, 2012  . 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 A-fib ([place])   . PVC's (premature ventricular contractions)   . Atrial flutter ([place])     Status post ablation  . HTN (hypertension)   . Primary osteoarthritis of left knee     Mild  . HX: anticoagulation   . Diverticulosis   . Tubular adenoma of colon   . Prolapsed internal hemorrhoids, grade 3 08/12/2015    Past Surgical History  Procedure Laterality Date  . Insert / replace / remove pacemaker  07/15/2012    initial placement  . Tonsillectomy  1956  . Coronary artery bypass graft  2000    CABG X5  . Radiofrequency  ablation for heart arrythmia    . Permanent pacemaker insertion N/A 07/15/2012    Procedure: PERMANENT PACEMAKER INSERTION;  Surgeon: Deboraha Sprang, MD;  Location: [facility] CATH LAB;   Service: Cardiovascular;  Laterality: N/A;  . Colonoscopy    . Esophagogastroduodenoscopy    . Hemorrhoid banding      There were no vitals filed for this visit.  Visit Diagnosis:  H/O repair of right rotator cuff  Pain in joint of right shoulder  Weakness      Subjective Assessment - 02/03/16 1550    Subjective Pt reports stiffness and "soreness," but no pain.    Currently in Pain? No/denies   Pain Score 0-No pain  3/10 with stretches   Pain Location Shoulder                         [facility] Adult PT Treatment/Exercise - 02/03/16 0001    Shoulder Exercises: Supine   Other Supine Exercises supine chest press, ER, FLexion x 15 reps each   Shoulder Exercises: Standing   External Rotation --   External Rotation Limitations --   Flexion --   Flexion Limitations --   Other Standing Exercises Cane standing flex and scaption 10 x each    Other Standing Exercises UE ranger on floor and on 6   inch and 8 inch  step with mirror feedback -focusing scapular rhythm  also on the wall level 12 with step forward x 10    Shoulder Exercises: Pulleys   Flexion 2 minutes   Shoulder Exercises: ROM/Strengthening   Other ROM/Strengthening Exercises standing wall slides for flexion with LUE assist x10    Shoulder Exercises: Stretch   Other Shoulder Stretches door way stretch ER 1 x 30 single arm with elbow flexed                  PT Short Term Goals - 01/27/16 1616    PT SHORT TERM GOAL #1   Title Pt will be I with initial HEP for continued strengthening and mobility by 01/28/16.    Time 4   Period Weeks   Status Achieved   PT SHORT TERM GOAL #2   Title Pt will be able to demo posture as it related to upper body, shoulder, and cervical spine by 01/28/16    Time 4   Period Weeks   Status Achieved   PT SHORT TERM GOAL #3   Title R shoulder PROM will imrpove to 110 degrees pain-free by 01/28/16.    Baseline 5-6/10 pain   Time 4   Period Weeks   Status Partially  Met           PT Long Term Goals - 01/27/16 1617    PT LONG TERM GOAL #1   Title R shoulder AROM will improve to 90 degrees pain-free for improved overhead reaching activities by 02/21/16.   Time 8   Period Weeks   Status On-going   PT LONG TERM GOAL #2   Title Pt will be able to lift 2# overhead x 10 reps without pain in order to return to pain free functional activities for lifting overhead by 02/21/16.   Time 8   Period Weeks   Status On-going   PT LONG TERM GOAL #3   Title FOTO score will improve from 71% limited to <40% to demo improved function and mobility by 02/21/16.    Time 8   Period Weeks   Status On-going               Plan - 02/03/16 1603    Clinical Impression Statement Pt limited to 115 degrees flexion with supine cane flexion due to pain beyond this point. Pt able to perform cane chest press without pain this week compared to last week. Added standing cane exercises with  flex and ABD without adverse effects. Instructed pt not to attempt yet for HEP. Pt admits to driving his manual shift car this weekend without increased pain.      PT Next Visit Plan Progress AAROM to tolerance.  Watch scap rhythm.  Sees MD 02/17/16.  Add standing cane to HEP, if no adverse effects from today's visit.     PT Home Exercise Plan Review AAROM for HEP progression.    Consulted and Agree with Plan of Care Patient        Problem List Patient Active Problem List   Diagnosis Date Noted  . Abdominal pain, epigastric 10/29/2015  . Prolapsed internal hemorrhoids, grade 3 08/12/2015  . IT band syndrome 07/23/2015  . Partial tear of rotator cuff 12/04/2014  . Well adult exam 07/03/2014  . Jock itch 07/03/2014  . Dyslipidemia 03/06/2014  . Ejection fraction   . Diverticulitis of colon without hemorrhage 08/31/2013  . Mitral regurgitation   . Tricuspid regurgitation   . HX: anticoagulation   . Abdominal distention 01/23/2013  . Diffuse anterior T-wave inversions  10/24/2012   . PPM-[employer] 07/16/2012  . 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 Shortness of breath   . Nausea   . Aortic stenosis   . Routine health maintenance 04/11/2012  . Plantar fasciitis of right foot 04/04/2012  . Tumescence 02/23/2012  . Numbness of toes   . CAD (coronary artery disease)   . Hx of CABG   . Drug therapy   . HTN (hypertension)   . Drug intolerance   . Pulmonic stenosis   . Aortic insufficiency   . Oral anticoagulation   . Hypokalemia   . A-fib ([place])   . QT prolongation on Tikosyn 03/26/2011  . PVC's (premature ventricular contractions)   . Carotid artery disease ([place])   . Elevated bilirubin   . THYROID STIMULATING HORMONE, ABNORMAL 04/01/2010  . DERANGEMENT OF MENISCUS NOT ELSEWHERE CLASSIFIED 12/03/2009  . Knee pain 12/03/2009  . BENIGN PROSTATIC HYPERTROPHY, WITH OBSTRUCTION 09/30/2009  . Disorders of bursae and tendons in shoulder region, unspecified 08/22/2009  . 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 ADENOMATOUS COLONIC POLYP 03/06/2008  . ANXIETY DEPRESSION 03/06/2008  . ESOPHAGITIS 03/06/2008  . ESOPHAGEAL STENOSIS 03/06/2008  . BARRETTS ESOPHAGUS 03/06/2008  . GASTRITIS, CHRONIC 03/06/2008  . DUODENITIS 03/06/2008  . DIVERTICULOSIS, COLON 03/06/2008  . PROSTATITIS, HX OF 03/06/2008  . OSTEOARTHRITIS, KNEE, LEFT 11/04/2007  . FOOT PAIN, LEFT 11/04/2007  . GERD 08/09/2007  . COLONIC POLYPS, HX OF 08/09/2007    Dollene Cleveland, PT 02/03/2016, 4:48 PM  [facility] [address] [place], [name], [address] Phone: [phone]   Fax:  [phone]  Name: Russell Griffith MRN: [id number] Date of Birth: 05-26-1946

## 2016-02-05 ENCOUNTER — Ambulatory Visit: Payer: PPO

## 2016-02-05 DIAGNOSIS — M25511 Pain in right shoulder: Secondary | ICD-10-CM

## 2016-02-05 DIAGNOSIS — R531 Weakness: Secondary | ICD-10-CM

## 2016-02-05 DIAGNOSIS — Z9889 Other specified postprocedural states: Secondary | ICD-10-CM

## 2016-02-05 NOTE — Therapy (Signed)
[place], [name], [address] Phone: [phone]   Fax:  [phone]  Physical Therapy Treatment  Patient Details  Name: Russell Griffith MRN: [id number] Date of Birth: 04-Mar-1946 Referring Provider: Durward Fortes  Encounter Date: 02/05/2016      PT End of Session - 02/05/16 1710    Visit Number 15   Number of Visits 24   Date for PT Re-Evaluation 02/21/16   PT Start Time 2993   PT Stop Time 1640   PT Time Calculation (min) 55 min   Activity Tolerance Patient tolerated treatment well   Behavior During Therapy [facility] for tasks assessed/performed      Past Medical History  Diagnosis Date  . CAD (coronary artery disease)     Myoview March 2014  . Hyperlipidemia     Low HDL  . GERD (gastroesophageal reflux disease)     Barrett's esophagus  . Colonic polyp   . Aortic stenosis     Mild, echo, April, 2014  . Carotid artery disease ([place])   . Elevated bilirubin     Mild chronic elevation, 2.0 January, 2011 stable  . BPH (benign prostatic hyperplasia)   . Chronic foot pain 2009    Resolved with use of orthotics  . Hemorrhoids     September, 2012  . 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 A-fib ([place])   . PVC's (premature ventricular contractions)   . Atrial flutter ([place])     Status post ablation  . HTN (hypertension)   . Primary osteoarthritis of left knee     Mild  . HX: anticoagulation   . Diverticulosis   . Tubular adenoma of colon   . Prolapsed internal hemorrhoids, grade 3 08/12/2015    Past Surgical History  Procedure Laterality Date  . Insert / replace / remove pacemaker  07/15/2012    initial placement  . Tonsillectomy  1956  . Coronary artery bypass graft  2000    CABG X5  . Radiofrequency  ablation for heart arrythmia    . Permanent pacemaker insertion N/A 07/15/2012    Procedure: PERMANENT PACEMAKER INSERTION;  Surgeon: Deboraha Sprang, MD;  Location: [facility] CATH LAB;   Service: Cardiovascular;  Laterality: N/A;  . Colonoscopy    . Esophagogastroduodenoscopy    . Hemorrhoid banding      There were no vitals filed for this visit.  Visit Diagnosis:  H/O repair of right rotator cuff  Pain in joint of right shoulder  Weakness      Subjective Assessment - 02/05/16 1608    Subjective Increased pain on Monday after visit that lasted into Tuesday. Pt "pushed throguh the exercies" on Tuesday AM, and pain increased to 7/10. Pt held exercises after, and pain has resolved since then, but pt describes "sharp" pain at anterior. superior shoulder.    Currently in Pain? No/denies   Pain Score 0-No pain                         [facility] Adult PT Treatment/Exercise - 02/05/16 0001    Shoulder Exercises: Supine   Other Supine Exercises supine chest press x 10   Shoulder Exercises: Standing   External Rotation AAROM;Right;10 reps   Shoulder Exercises: Stretch   Table Stretch - Flexion 10 seconds   Table Stretch -Flexion Limitations x 10 reps    Other Shoulder Stretches door way stretch ER 2 x  30 single arm with elbow flexed   Cryotherapy   Number Minutes Cryotherapy 10 Minutes   Cryotherapy Location Shoulder   Type of Cryotherapy Ice pack   Manual Therapy   Joint Mobilization grade 2-3 inferior,posterior, distraction glides   Passive ROM PROM R SH all directions                 PT Education - 02/05/16 1711    Education provided Yes   Education Details see plan    Person(s) Educated Patient   Methods Explanation;Demonstration   Comprehension Verbalized understanding          PT Short Term Goals - 01/27/16 1616    PT SHORT TERM GOAL #1   Title Pt will be I with initial HEP for continued strengthening and mobility by 01/28/16.    Time 4   Period Weeks   Status Achieved   PT SHORT TERM GOAL #2   Title Pt will be able to demo posture as it related to upper body, shoulder, and cervical spine by 01/28/16    Time 4   Period Weeks    Status Achieved   PT SHORT TERM GOAL #3   Title R shoulder PROM will imrpove to 110 degrees pain-free by 01/28/16.    Baseline 5-6/10 pain   Time 4   Period Weeks   Status Partially Met           PT Long Term Goals - 01/27/16 1617    PT LONG TERM GOAL #1   Title R shoulder AROM will improve to 90 degrees pain-free for improved overhead reaching activities by 02/21/16.   Time 8   Period Weeks   Status On-going   PT LONG TERM GOAL #2   Title Pt will be able to lift 2# overhead x 10 reps without pain in order to return to pain free functional activities for lifting overhead by 02/21/16.   Time 8   Period Weeks   Status On-going   PT LONG TERM GOAL #3   Title FOTO score will improve from 71% limited to <40% to demo improved function and mobility by 02/21/16.    Time 8   Period Weeks   Status On-going               Plan - 02/05/16 1612    Clinical Impression Statement Today's visit concentrated on understanding difference between stretch and "sharp" pain. Reviewed stretches for home with more passive approach versus active. Also discussed how overloading of biceps muscle may cause the "sharp" pain. Pt noted that he returned to using keyboard on both arms on Monday and that might have aggrevated it as well. Pt showed PT positions that aggrevated symptoms and were all loading and eccentric loading of biceps tendon. PT reviewed importance of avoiding activities that increase this sharp pain, but that stretching is OK.   Reviewed stretches with cues to remain passive and with minimal mm gaurding.  Pt verbalized understanding. Decreased difficulty of activity today due to increased pain after last visit: Held standing can flexion, pulleys, and wall slides.    PT Next Visit Plan Progress AAROM to tolerance.  Watch scap rhythm.  Sees MD 02/17/16. Follow-up on Pain   PT Home Exercise Plan Review AAROM for HEP progression.    Consulted and Agree with Plan of Care Patient         Problem List Patient Active Problem List   Diagnosis Date Noted  . Abdominal pain, epigastric 10/29/2015  .  Prolapsed internal hemorrhoids, grade 3 08/12/2015  . IT band syndrome 07/23/2015  . Partial tear of rotator cuff 12/04/2014  . Well adult exam 07/03/2014  . Jock itch 07/03/2014  . Dyslipidemia 03/06/2014  . Ejection fraction   . Diverticulitis of colon without hemorrhage 08/31/2013  . Mitral regurgitation   . Tricuspid regurgitation   . HX: anticoagulation   . Abdominal distention 01/23/2013  . Diffuse anterior T-wave inversions 10/24/2012  . PPM-[employer] 07/16/2012  . 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 Shortness of breath   . Nausea   . Aortic stenosis   . Routine health maintenance 04/11/2012  . Plantar fasciitis of right foot 04/04/2012  . Tumescence 02/23/2012  . Numbness of toes   . CAD (coronary artery disease)   . Hx of CABG   . Drug therapy   . HTN (hypertension)   . Drug intolerance   . Pulmonic stenosis   . Aortic insufficiency   . Oral anticoagulation   . Hypokalemia   . A-fib ([place])   . QT prolongation on Tikosyn 03/26/2011  . PVC's (premature ventricular contractions)   . Carotid artery disease ([place])   . Elevated bilirubin   . THYROID STIMULATING HORMONE, ABNORMAL 04/01/2010  . DERANGEMENT OF MENISCUS NOT ELSEWHERE CLASSIFIED 12/03/2009  . Knee pain 12/03/2009  . BENIGN PROSTATIC HYPERTROPHY, WITH OBSTRUCTION 09/30/2009  . Disorders of bursae and tendons in shoulder region, unspecified 08/22/2009  . 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 ADENOMATOUS COLONIC POLYP 03/06/2008  . ANXIETY DEPRESSION 03/06/2008  . ESOPHAGITIS 03/06/2008  . ESOPHAGEAL STENOSIS 03/06/2008  . BARRETTS ESOPHAGUS 03/06/2008  . GASTRITIS, CHRONIC 03/06/2008  . DUODENITIS 03/06/2008  . DIVERTICULOSIS, COLON 03/06/2008  . PROSTATITIS, HX OF 03/06/2008  .  OSTEOARTHRITIS, KNEE, LEFT 11/04/2007  . FOOT PAIN, LEFT 11/04/2007  . GERD 08/09/2007  . COLONIC POLYPS, HX OF 08/09/2007    Dollene Cleveland, PT 02/05/2016, 5:16 PM  [facility] [address] [place], [name], [address] Phone: [phone]   Fax:  [phone]  Name: Russell Griffith MRN: [id number] Date of Birth: [birth year]

## 2016-02-06 ENCOUNTER — Ambulatory Visit: Payer: PPO

## 2016-02-06 DIAGNOSIS — M25511 Pain in right shoulder: Secondary | ICD-10-CM | POA: Diagnosis not present

## 2016-02-06 DIAGNOSIS — R531 Weakness: Secondary | ICD-10-CM

## 2016-02-06 DIAGNOSIS — Z9889 Other specified postprocedural states: Secondary | ICD-10-CM

## 2016-02-06 NOTE — Therapy (Signed)
[name], [name], [address] Phone: [phone]   Fax:  [phone]  Physical Therapy Treatment  Patient Details  Name: Russell Griffith MRN: [id number] Date of Birth: 07-29-1946 Referring Provider: Durward Fortes  Encounter Date: 02/06/2016      PT End of Session - 02/06/16 1809    Visit Number 16   Number of Visits 24   Date for PT Re-Evaluation 02/21/16   PT Start Time 1550   PT Stop Time 1645   PT Time Calculation (min) 55 min   Activity Tolerance Patient tolerated treatment well   Behavior During Therapy [facility] for tasks assessed/performed      Past Medical History  Diagnosis Date  . CAD (coronary artery disease)     Myoview March 2014  . Hyperlipidemia     Low HDL  . GERD (gastroesophageal reflux disease)     Barrett's esophagus  . Colonic polyp   . Aortic stenosis     Mild, echo, April, 2014  . Carotid artery disease ([place])   . Elevated bilirubin     Mild chronic elevation, 2.0 January, 2011 stable  . BPH (benign prostatic hyperplasia)   . Chronic foot pain 2009    Resolved with use of orthotics  . Hemorrhoids     September, 2012  . 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 A-fib ([place])   . PVC's (premature ventricular contractions)   . Atrial flutter ([place])     Status post ablation  . HTN (hypertension)   . Primary osteoarthritis of left knee     Mild  . HX: anticoagulation   . Diverticulosis   . Tubular adenoma of colon   . Prolapsed internal hemorrhoids, grade 3 08/12/2015    Past Surgical History  Procedure Laterality Date  . Insert / replace / remove pacemaker  07/15/2012    initial placement  . Tonsillectomy  1956  . Coronary artery bypass graft  2000    CABG X5  . Radiofrequency  ablation for heart arrythmia    . Permanent pacemaker insertion N/A 07/15/2012    Procedure: PERMANENT PACEMAKER INSERTION;  Surgeon: Deboraha Sprang, MD;  Location: [facility] CATH LAB;   Service: Cardiovascular;  Laterality: N/A;  . Colonoscopy    . Esophagogastroduodenoscopy    . Hemorrhoid banding      There were no vitals filed for this visit.  Visit Diagnosis:  H/O repair of right rotator cuff  Pain in joint of right shoulder  Weakness      Subjective Assessment - 02/06/16 1608    Subjective Pt reports increased pain after attempting wall slides today. Pain increased to 5/10. Pt is going to the beach this weekend and was concerned about not being able to use his pulleys.    Currently in Pain? Yes   Pain Score 3    Pain Location Shoulder   Pain Orientation Right   Pain Descriptors / Indicators Aching   Pain Type Surgical pain                         [facility] Adult PT Treatment/Exercise - 02/06/16 0001    Shoulder Exercises: Supine   Other Supine Exercises supine chest press x 10  with overhead flexion.    Shoulder Exercises: Standing   External Rotation AAROM;Right;10 reps   Other Standing Exercises Cane standing flex and scaption 10 x each   HEP  Shoulder Exercises: Pulleys   Flexion 2 minutes   Shoulder Exercises: ROM/Strengthening   Other ROM/Strengthening Exercises standing wall slides with ball for flexion with LUE assist x10   HEP   Shoulder Exercises: Stretch   External Rotation Stretch 10 seconds  x 3 seated at table   Table Stretch - Flexion 10 seconds   Table Stretch -Flexion Limitations x 10 reps    Cryotherapy   Number Minutes Cryotherapy 10 Minutes   Cryotherapy Location Shoulder   Type of Cryotherapy Ice pack   Manual Therapy   Joint Mobilization grade 2-3 inferior,posterior, distraction glides   Passive ROM PROM R SH all directions                 PT Education - 02/05/16 1711    Education provided Yes   Education Details see plan    Person(s) Educated Patient   Methods Explanation;Demonstration   Comprehension Verbalized understanding          PT Short Term Goals - 01/27/16 1616    PT SHORT  TERM GOAL #1   Title Pt will be I with initial HEP for continued strengthening and mobility by 01/28/16.    Time 4   Period Weeks   Status Achieved   PT SHORT TERM GOAL #2   Title Pt will be able to demo posture as it related to upper body, shoulder, and cervical spine by 01/28/16    Time 4   Period Weeks   Status Achieved   PT SHORT TERM GOAL #3   Title R shoulder PROM will imrpove to 110 degrees pain-free by 01/28/16.    Baseline 5-6/10 pain   Time 4   Period Weeks   Status Partially Met           PT Long Term Goals - 01/27/16 1617    PT LONG TERM GOAL #1   Title R shoulder AROM will improve to 90 degrees pain-free for improved overhead reaching activities by 02/21/16.   Time 8   Period Weeks   Status On-going   PT LONG TERM GOAL #2   Title Pt will be able to lift 2# overhead x 10 reps without pain in order to return to pain free functional activities for lifting overhead by 02/21/16.   Time 8   Period Weeks   Status On-going   PT LONG TERM GOAL #3   Title FOTO score will improve from 71% limited to <40% to demo improved function and mobility by 02/21/16.    Time 8   Period Weeks   Status On-going               Plan - 02/06/16 1809    Clinical Impression Statement Progressed AAROM to standing flexion with cane flex and abd and added for HEP. Added wall slides with small ball to roll on wall for improved ease and use of L UE to assist R, if needed. Instructed pt in HEP for 2 times a day at beach vacation this week to include table slides, wall slides, cane flex/ abd standing, and supine cane chest press. Pt progressing through mpre challenging AAROM activities and should progress to AROM over the next 1-2 weeks.    PT Next Visit Plan  Sees MD 02/17/16. Follow-up on Pain. Add shoulder isometrics. Add cross body adduction stretch, if pain- free.  Progress to AROM, if able in supine to tolerance.  Progress AAROM to AROM to tolerance.  Watch scap rhythm.    PT Home  Exercise  Plan  table slides, wall slides, cane flex/ abd standing, and supine cane chest press.   Consulted and Agree with Plan of Care Patient        Problem List Patient Active Problem List   Diagnosis Date Noted  . Abdominal pain, epigastric 10/29/2015  . Prolapsed internal hemorrhoids, grade 3 08/12/2015  . IT band syndrome 07/23/2015  . Partial tear of rotator cuff 12/04/2014  . Well adult exam 07/03/2014  . Jock itch 07/03/2014  . Dyslipidemia 03/06/2014  . Ejection fraction   . Diverticulitis of colon without hemorrhage 08/31/2013  . Mitral regurgitation   . Tricuspid regurgitation   . HX: anticoagulation   . Abdominal distention 01/23/2013  . Diffuse anterior T-wave inversions 10/24/2012  . PPM-[employer] 07/16/2012  . 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 Shortness of breath   . Nausea   . Aortic stenosis   . Routine health maintenance 04/11/2012  . Plantar fasciitis of right foot 04/04/2012  . Tumescence 02/23/2012  . Numbness of toes   . CAD (coronary artery disease)   . Hx of CABG   . Drug therapy   . HTN (hypertension)   . Drug intolerance   . Pulmonic stenosis   . Aortic insufficiency   . Oral anticoagulation   . Hypokalemia   . A-fib ([place])   . QT prolongation on Tikosyn 03/26/2011  . PVC's (premature ventricular contractions)   . Carotid artery disease ([place])   . Elevated bilirubin   . THYROID STIMULATING HORMONE, ABNORMAL 04/01/2010  . DERANGEMENT OF MENISCUS NOT ELSEWHERE CLASSIFIED 12/03/2009  . Knee pain 12/03/2009  . BENIGN PROSTATIC HYPERTROPHY, WITH OBSTRUCTION 09/30/2009  . Disorders of bursae and tendons in shoulder region, unspecified 08/22/2009  . 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 ADENOMATOUS COLONIC POLYP 03/06/2008  . ANXIETY DEPRESSION 03/06/2008  . ESOPHAGITIS 03/06/2008  . ESOPHAGEAL STENOSIS 03/06/2008  . BARRETTS ESOPHAGUS 03/06/2008   . GASTRITIS, CHRONIC 03/06/2008  . DUODENITIS 03/06/2008  . DIVERTICULOSIS, COLON 03/06/2008  . PROSTATITIS, HX OF 03/06/2008  . OSTEOARTHRITIS, KNEE, LEFT 11/04/2007  . FOOT PAIN, LEFT 11/04/2007  . GERD 08/09/2007  . COLONIC POLYPS, HX OF 08/09/2007    Dollene Cleveland , PT  02/06/2016, 6:17 PM  [facility] [address] [place], [name], [address] Phone: [phone]   Fax:  [phone]  Name: Russell Griffith MRN: [id number] Date of Birth: 04-14-1946

## 2016-02-11 ENCOUNTER — Ambulatory Visit: Payer: PPO | Admitting: Physical Therapy

## 2016-02-11 DIAGNOSIS — R531 Weakness: Secondary | ICD-10-CM

## 2016-02-11 DIAGNOSIS — M25511 Pain in right shoulder: Secondary | ICD-10-CM | POA: Diagnosis not present

## 2016-02-11 DIAGNOSIS — Z9889 Other specified postprocedural states: Secondary | ICD-10-CM

## 2016-02-11 NOTE — Therapy (Signed)
[place], [name], [address] Phone: [phone]   Fax:  [phone]  Physical Therapy Treatment  Patient Details  Name: Russell Griffith MRN: [id number] Date of Birth: Feb 05, 1946 Referring Provider: Durward Fortes  Encounter Date: 02/11/2016      PT End of Session - 02/11/16 0915    Visit Number 17   Number of Visits 24   Date for PT Re-Evaluation 02/21/16   PT Start Time 0830   PT Stop Time 0923   PT Time Calculation (min) 53 min   Activity Tolerance Patient tolerated treatment well   Behavior During Therapy [facility] for tasks assessed/performed      Past Medical History  Diagnosis Date  . CAD (coronary artery disease)     Myoview March 2014  . Hyperlipidemia     Low HDL  . GERD (gastroesophageal reflux disease)     Barrett's esophagus  . Colonic polyp   . Aortic stenosis     Mild, echo, April, 2014  . Carotid artery disease ([place])   . Elevated bilirubin     Mild chronic elevation, 2.0 January, 2011 stable  . BPH (benign prostatic hyperplasia)   . Chronic foot pain 2009    Resolved with use of orthotics  . Hemorrhoids     September, 2012  . 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 A-fib ([place])   . PVC's (premature ventricular contractions)   . Atrial flutter ([place])     Status post ablation  . HTN (hypertension)   . Primary osteoarthritis of left knee     Mild  . HX: anticoagulation   . Diverticulosis   . Tubular adenoma of colon   . Prolapsed internal hemorrhoids, grade 3 08/12/2015    Past Surgical History  Procedure Laterality Date  . Insert / replace / remove pacemaker  07/15/2012    initial placement  . Tonsillectomy  1956  . Coronary artery bypass graft  2000    CABG X5  . Radiofrequency  ablation for heart arrythmia    . Permanent pacemaker insertion N/A 07/15/2012    Procedure: PERMANENT PACEMAKER INSERTION;  Surgeon: Deboraha Sprang, MD;  Location: [facility] CATH LAB;   Service: Cardiovascular;  Laterality: N/A;  . Colonoscopy    . Esophagogastroduodenoscopy    . Hemorrhoid banding      There were no vitals filed for this visit.  Visit Diagnosis:  H/O repair of right rotator cuff  Pain in joint of right shoulder  Weakness      Subjective Assessment - 02/11/16 0832    Subjective Did cane and table stretches while at the beach, overall went ok.   Currently in Pain? --  no c/o pain, just muscle soreness                         [facility] Adult PT Treatment/Exercise - 02/11/16 0832    Shoulder Exercises: Supine   External Rotation AAROM;Right;10 reps   Flexion AROM;Right;10 reps   Other Supine Exercises supine chest press x 10 with overhead flexion   Shoulder Exercises: Sidelying   External Rotation AROM;Right;10 reps   ABduction AROM;Right;10 reps   ABduction Limitations to 90 degrees only   Shoulder Exercises: Pulleys   Flexion 2 minutes   Flexion Limitations focusing of scapular rhythm   ABduction 2 minutes   ABduction Limitations scaption   Shoulder Exercises: Isometric Strengthening   Flexion  Other (comment)  10x5"   Extension Other (comment)  10x5"   External Rotation Other (comment)  10x5"   Internal Rotation Other (comment)  10x5"   Cryotherapy   Number Minutes Cryotherapy 10 Minutes   Cryotherapy Location Shoulder   Type of Cryotherapy Ice pack                  PT Short Term Goals - 01/27/16 1616    PT SHORT TERM GOAL #1   Title Pt will be I with initial HEP for continued strengthening and mobility by 01/28/16.    Time 4   Period Weeks   Status Achieved   PT SHORT TERM GOAL #2   Title Pt will be able to demo posture as it related to upper body, shoulder, and cervical spine by 01/28/16    Time 4   Period Weeks   Status Achieved   PT SHORT TERM GOAL #3   Title R shoulder PROM will imrpove to 110 degrees pain-free by 01/28/16.    Baseline 5-6/10 pain   Time 4   Period Weeks   Status Partially  Met           PT Long Term Goals - 01/27/16 1617    PT LONG TERM GOAL #1   Title R shoulder AROM will improve to 90 degrees pain-free for improved overhead reaching activities by 02/21/16.   Time 8   Period Weeks   Status On-going   PT LONG TERM GOAL #2   Title Pt will be able to lift 2# overhead x 10 reps without pain in order to return to pain free functional activities for lifting overhead by 02/21/16.   Time 8   Period Weeks   Status On-going   PT LONG TERM GOAL #3   Title FOTO score will improve from 71% limited to <40% to demo improved function and mobility by 02/21/16.    Time 8   Period Weeks   Status On-going               Plan - 02/11/16 0916    Clinical Impression Statement Pt tolerated supine and sidelying active exercises as well as standing isometrics today with only mild increase in soreness with shoulder flexion.  Pt progressing well and will continue to benefit from PT to maximize function and strength.   PT Next Visit Plan continue AROM, measure, isometrics   PT Home Exercise Plan  table slides, wall slides, cane flex/ abd standing, and supine cane chest press.   Consulted and Agree with Plan of Care Patient        Problem List Patient Active Problem List   Diagnosis Date Noted  . Abdominal pain, epigastric 10/29/2015  . Prolapsed internal hemorrhoids, grade 3 08/12/2015  . IT band syndrome 07/23/2015  . Partial tear of rotator cuff 12/04/2014  . Well adult exam 07/03/2014  . Jock itch 07/03/2014  . Dyslipidemia 03/06/2014  . Ejection fraction   . Diverticulitis of colon without hemorrhage 08/31/2013  . Mitral regurgitation   . Tricuspid regurgitation   . HX: anticoagulation   . Abdominal distention 01/23/2013  . Diffuse anterior T-wave inversions 10/24/2012  . PPM-[employer] 07/16/2012  . 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 Shortness of breath   . Nausea    . Aortic stenosis   . Routine health maintenance 04/11/2012  . Plantar fasciitis of right foot 04/04/2012  . Tumescence 02/23/2012  . Numbness of toes   .  CAD (coronary artery disease)   . Hx of CABG   . Drug therapy   . HTN (hypertension)   . Drug intolerance   . Pulmonic stenosis   . Aortic insufficiency   . Oral anticoagulation   . Hypokalemia   . A-fib ([place])   . QT prolongation on Tikosyn 03/26/2011  . PVC's (premature ventricular contractions)   . Carotid artery disease ([place])   . Elevated bilirubin   . THYROID STIMULATING HORMONE, ABNORMAL 04/01/2010  . DERANGEMENT OF MENISCUS NOT ELSEWHERE CLASSIFIED 12/03/2009  . Knee pain 12/03/2009  . BENIGN PROSTATIC HYPERTROPHY, WITH OBSTRUCTION 09/30/2009  . Disorders of bursae and tendons in shoulder region, unspecified 08/22/2009  . 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 ADENOMATOUS COLONIC POLYP 03/06/2008  . ANXIETY DEPRESSION 03/06/2008  . ESOPHAGITIS 03/06/2008  . ESOPHAGEAL STENOSIS 03/06/2008  . BARRETTS ESOPHAGUS 03/06/2008  . GASTRITIS, CHRONIC 03/06/2008  . DUODENITIS 03/06/2008  . DIVERTICULOSIS, COLON 03/06/2008  . PROSTATITIS, HX OF 03/06/2008  . OSTEOARTHRITIS, KNEE, LEFT 11/04/2007  . FOOT PAIN, LEFT 11/04/2007  . GERD 08/09/2007  . COLONIC POLYPS, HX OF 08/09/2007   Laureen Abrahams, PT, DPT 02/11/2016 9:24 AM  [facility] [address] [place], [name], [address] Phone: [phone]   Fax:  [phone]  Name: Jhair ED Vitanza MRN: [id number] Date of Birth: [birth year]

## 2016-02-13 ENCOUNTER — Ambulatory Visit: Payer: PPO | Admitting: Physical Therapy

## 2016-02-13 DIAGNOSIS — Z9889 Other specified postprocedural states: Secondary | ICD-10-CM

## 2016-02-13 DIAGNOSIS — M25511 Pain in right shoulder: Secondary | ICD-10-CM

## 2016-02-13 DIAGNOSIS — R531 Weakness: Secondary | ICD-10-CM

## 2016-02-13 NOTE — Therapy (Signed)
[place], [name], [address] Phone: [phone]   Fax:  [phone]  Physical Therapy Treatment  Patient Details  Name: Russell Griffith MRN: [id number] Date of Birth: 01-Oct-1946 Referring Provider: Durward Fortes  Encounter Date: 02/13/2016      PT End of Session - 02/13/16 1636    Visit Number 18   Number of Visits 24   Date for PT Re-Evaluation 02/21/16   PT Start Time 1600   PT Stop Time 1650   PT Time Calculation (min) 50 min   Activity Tolerance Patient tolerated treatment well   Behavior During Therapy [facility] for tasks assessed/performed      Past Medical History  Diagnosis Date  . CAD (coronary artery disease)     Myoview March 2014  . Hyperlipidemia     Low HDL  . GERD (gastroesophageal reflux disease)     Barrett's esophagus  . Colonic polyp   . Aortic stenosis     Mild, echo, April, 2014  . Carotid artery disease ([place])   . Elevated bilirubin     Mild chronic elevation, 2.0 January, 2011 stable  . BPH (benign prostatic hyperplasia)   . Chronic foot pain 2009    Resolved with use of orthotics  . Hemorrhoids     September, 2012  . 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 A-fib ([place])   . PVC's (premature ventricular contractions)   . Atrial flutter ([place])     Status post ablation  . HTN (hypertension)   . Primary osteoarthritis of left knee     Mild  . HX: anticoagulation   . Diverticulosis   . Tubular adenoma of colon   . Prolapsed internal hemorrhoids, grade 3 08/12/2015    Past Surgical History  Procedure Laterality Date  . Insert / replace / remove pacemaker  07/15/2012    initial placement  . Tonsillectomy  1956  . Coronary artery bypass graft  2000    CABG X5  . Radiofrequency  ablation for heart arrythmia    . Permanent pacemaker insertion N/A 07/15/2012    Procedure: PERMANENT PACEMAKER INSERTION;  Surgeon: Deboraha Sprang, MD;  Location: [facility] CATH LAB;   Service: Cardiovascular;  Laterality: N/A;  . Colonoscopy    . Esophagogastroduodenoscopy    . Hemorrhoid banding      There were no vitals filed for this visit.  Visit Diagnosis:  H/O repair of right rotator cuff  Pain in joint of right shoulder  Weakness      Subjective Assessment - 02/13/16 1601    Subjective isometrics are a little bit painful   Currently in Pain? No/denies            [facility] PT Assessment - 02/13/16 1606    AROM   Right Shoulder Flexion 134 Degrees   Right Shoulder ABduction 90 Degrees   Right Shoulder Internal Rotation 69 Degrees   Right Shoulder External Rotation 46 Degrees                     [facility] Adult PT Treatment/Exercise - 02/13/16 1602    Shoulder Exercises: Supine   External Rotation AAROM;Right;10 reps   Flexion AAROM;10 reps   Other Supine Exercises supine chest press x 10 with overhead flexion   Shoulder Exercises: Standing   Flexion Right;AAROM;5 reps   Flexion Limitations wall ladder   ABduction AAROM;Right;5 reps   ABduction Limitations scaption; wall  ladder   Row Both;10 reps;Theraband   Theraband Level (Shoulder Row) Level 2 (Red)   Shoulder Exercises: Isometric Strengthening   Flexion Other (comment)  10x5"   Extension Other (comment)  10x5"   External Rotation Other (comment)  10x5"   Internal Rotation Other (comment)  10x5"   Cryotherapy   Number Minutes Cryotherapy 10 Minutes   Cryotherapy Location Shoulder   Type of Cryotherapy Ice pack                  PT Short Term Goals - 01/27/16 1616    PT SHORT TERM GOAL #1   Title Pt will be I with initial HEP for continued strengthening and mobility by 01/28/16.    Time 4   Period Weeks   Status Achieved   PT SHORT TERM GOAL #2   Title Pt will be able to demo posture as it related to upper body, shoulder, and cervical spine by 01/28/16    Time 4   Period Weeks   Status Achieved   PT SHORT TERM GOAL #3   Title R shoulder PROM will imrpove to  110 degrees pain-free by 01/28/16.    Baseline 5-6/10 pain   Time 4   Period Weeks   Status Partially Met           PT Long Term Goals - 01/27/16 1617    PT LONG TERM GOAL #1   Title R shoulder AROM will improve to 90 degrees pain-free for improved overhead reaching activities by 02/21/16.   Time 8   Period Weeks   Status On-going   PT LONG TERM GOAL #2   Title Pt will be able to lift 2# overhead x 10 reps without pain in order to return to pain free functional activities for lifting overhead by 02/21/16.   Time 8   Period Weeks   Status On-going   PT LONG TERM GOAL #3   Title FOTO score will improve from 71% limited to <40% to demo improved function and mobility by 02/21/16.    Time 8   Period Weeks   Status On-going               Plan - 02/13/16 1636    Clinical Impression Statement Pt with improvement in AROM of R shoulder and has begun progressing to AROM exercises and isometrics to begin strengthening activities.  Pt will continue to benefit from PT to maximize function.   PT Next Visit Plan continue AROM, isometrics   PT Home Exercise Plan  table slides, wall slides, cane flex/ abd standing, and supine cane chest press.   Consulted and Agree with Plan of Care Patient        Problem List Patient Active Problem List   Diagnosis Date Noted  . Abdominal pain, epigastric 10/29/2015  . Prolapsed internal hemorrhoids, grade 3 08/12/2015  . IT band syndrome 07/23/2015  . Partial tear of rotator cuff 12/04/2014  . Well adult exam 07/03/2014  . Jock itch 07/03/2014  . Dyslipidemia 03/06/2014  . Ejection fraction   . Diverticulitis of colon without hemorrhage 08/31/2013  . Mitral regurgitation   . Tricuspid regurgitation   . HX: anticoagulation   . Abdominal distention 01/23/2013  . Diffuse anterior T-wave inversions 10/24/2012  . PPM-[employer] 07/16/2012  . 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 Lung  granuloma ([place])   . Shortness of breath   . Nausea   . Aortic stenosis   . Routine health maintenance 04/11/2012  .  Plantar fasciitis of right foot 04/04/2012  . Tumescence 02/23/2012  . Numbness of toes   . CAD (coronary artery disease)   . Hx of CABG   . Drug therapy   . HTN (hypertension)   . Drug intolerance   . Pulmonic stenosis   . Aortic insufficiency   . Oral anticoagulation   . Hypokalemia   . A-fib ([place])   . QT prolongation on Tikosyn 03/26/2011  . PVC's (premature ventricular contractions)   . Carotid artery disease ([place])   . Elevated bilirubin   . THYROID STIMULATING HORMONE, ABNORMAL 04/01/2010  . DERANGEMENT OF MENISCUS NOT ELSEWHERE CLASSIFIED 12/03/2009  . Knee pain 12/03/2009  . BENIGN PROSTATIC HYPERTROPHY, WITH OBSTRUCTION 09/30/2009  . Disorders of bursae and tendons in shoulder region, unspecified 08/22/2009  . 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 ADENOMATOUS COLONIC POLYP 03/06/2008  . ANXIETY DEPRESSION 03/06/2008  . ESOPHAGITIS 03/06/2008  . ESOPHAGEAL STENOSIS 03/06/2008  . BARRETTS ESOPHAGUS 03/06/2008  . GASTRITIS, CHRONIC 03/06/2008  . DUODENITIS 03/06/2008  . DIVERTICULOSIS, COLON 03/06/2008  . PROSTATITIS, HX OF 03/06/2008  . OSTEOARTHRITIS, KNEE, LEFT 11/04/2007  . FOOT PAIN, LEFT 11/04/2007  . GERD 08/09/2007  . COLONIC POLYPS, HX OF 08/09/2007   Laureen Abrahams, PT, DPT 02/13/2016 4:40 PM  [facility] [address] [place], [name], [address] Phone: [phone]   Fax:  [phone]  Name: Russell Griffith MRN: [id number] Date of Birth: [birth year]

## 2016-02-17 ENCOUNTER — Ambulatory Visit: Payer: PPO

## 2016-02-17 DIAGNOSIS — M25511 Pain in right shoulder: Secondary | ICD-10-CM | POA: Diagnosis not present

## 2016-02-17 DIAGNOSIS — Z9889 Other specified postprocedural states: Secondary | ICD-10-CM

## 2016-02-17 DIAGNOSIS — R531 Weakness: Secondary | ICD-10-CM

## 2016-02-17 NOTE — Therapy (Signed)
[place], [name], [address] Phone: [phone]   Fax:  [phone]  Physical Therapy Treatment  Patient Details  Name: Russell Griffith MRN: [id number] Date of Birth: 08-13-1946 Referring Provider: Durward Fortes  Encounter Date: 02/17/2016      PT End of Session - 02/17/16 1556    Visit Number 19   Number of Visits 24   Date for PT Re-Evaluation 02/21/16   PT Start Time 1552   PT Stop Time 1640   PT Time Calculation (min) 48 min   Activity Tolerance Patient tolerated treatment well   Behavior During Therapy [facility] for tasks assessed/performed      Past Medical History  Diagnosis Date  . CAD (coronary artery disease)     Myoview March 2014  . Hyperlipidemia     Low HDL  . GERD (gastroesophageal reflux disease)     Barrett's esophagus  . Colonic polyp   . Aortic stenosis     Mild, echo, April, 2014  . Carotid artery disease ([place])   . Elevated bilirubin     Mild chronic elevation, 2.0 January, 2011 stable  . BPH (benign prostatic hyperplasia)   . Chronic foot pain 2009    Resolved with use of orthotics  . Hemorrhoids     September, 2012  . 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)     Left  lung chest x-ray July, 2013  . A-fib ([place])   . PVC's (premature ventricular contractions)   . Atrial flutter ([place])     Status post ablation  . HTN (hypertension)   . Primary osteoarthritis of left knee     Mild  . HX: anticoagulation   . Diverticulosis   . Tubular adenoma of colon   . Prolapsed internal hemorrhoids, grade 3 08/12/2015    Past Surgical History  Procedure Laterality Date  . Insert / replace / remove pacemaker  07/15/2012    initial placement  . Tonsillectomy  1956  . Coronary artery bypass graft  2000    CABG X5  . Radiofrequency  ablation for heart arrythmia    . Permanent pacemaker insertion N/A 07/15/2012    Procedure: PERMANENT PACEMAKER INSERTION;  Surgeon: Deboraha Sprang, MD;  Location: [facility] CATH LAB;   Service: Cardiovascular;  Laterality: N/A;  . Colonoscopy    . Esophagogastroduodenoscopy    . Hemorrhoid banding      There were no vitals filed for this visit.  Visit Diagnosis:  H/O repair of right rotator cuff  Pain in joint of right shoulder  Weakness      Subjective Assessment - 02/17/16 1557    Subjective Pt saw Dr Durward Fortes today. No restrictions. Plans to return once more in 1 month.    Currently in Pain? No/denies   Pain Score 0-No pain   Pain Location Shoulder   Pain Orientation Right   Pain Descriptors / Indicators Aching   Pain Type Surgical pain   Pain Onset 1 to 4 weeks ago   Pain Frequency Intermittent                         [facility] Adult PT Treatment/Exercise - 02/17/16 0001    Shoulder Exercises: Supine   Protraction AROM;Right;10 reps  x 2   External Rotation AAROM;Right;10 reps   Flexion AAROM;10 reps  chest press with 1.5 lb weight overhead stretch.    Shoulder Exercises: Seated   Other Seated Exercises UBE 2 mins for/ 2 mins  back L1    Shoulder Exercises: Sidelying   External Rotation AROM;Right;12 reps  x 2    ABduction AROM;12 reps  x 2   ABduction Limitations to 90 degrees only   Shoulder Exercises: Standing   External Rotation AROM;12 reps   Flexion AROM;Both;12 reps   Flexion Limitations wall slides    ABduction AROM;Both;12 reps   Row Both;15 reps;Theraband   Theraband Level (Shoulder Row) Level 2 (Red)   Other Standing Exercises Elliptical  5 mins with use of arms.    Shoulder Exercises: Pulleys   Flexion 2 minutes   ABduction 2 minutes   Shoulder Exercises: ROM/Strengthening   UBE (Upper Arm Bike) 2 min for, 2 min back                   PT Short Term Goals - 01/27/16 1616    PT SHORT TERM GOAL #1   Title Pt will be I with initial HEP for continued strengthening and mobility by 01/28/16.    Time 4   Period Weeks   Status Achieved   PT SHORT TERM GOAL #2   Title Pt will be able to demo posture as it  related to upper body, shoulder, and cervical spine by 01/28/16    Time 4   Period Weeks   Status Achieved   PT SHORT TERM GOAL #3   Title R shoulder PROM will imrpove to 110 degrees pain-free by 01/28/16.    Baseline 5-6/10 pain   Time 4   Period Weeks   Status Partially Met           PT Long Term Goals - 01/27/16 1617    PT LONG TERM GOAL #1   Title R shoulder AROM will improve to 90 degrees pain-free for improved overhead reaching activities by 02/21/16.   Time 8   Period Weeks   Status On-going   PT LONG TERM GOAL #2   Title Pt will be able to lift 2# overhead x 10 reps without pain in order to return to pain free functional activities for lifting overhead by 02/21/16.   Time 8   Period Weeks   Status On-going   PT LONG TERM GOAL #3   Title FOTO score will improve from 71% limited to <40% to demo improved function and mobility by 02/21/16.    Time 8   Period Weeks   Status On-going               Plan - 02/17/16 1624    Clinical Impression Statement Pt able to perform AROM in standing and S/L today without pain. Pt needs recert next visit and would benefit from continued PT to return to PLOF. FOTO performed this visit and improved to  42 % limited. Pt frequency decreased to 2 x a week due to progress .     PT Next Visit Plan ERO- recert . continue AROM, isometrics. Add AROM ther ex to HEP next visit.    PT Home Exercise Plan  table slides, wall slides, cane flex/ abd standing, and supine cane chest press.   Consulted and Agree with Plan of Care Patient        Problem List Patient Active Problem List   Diagnosis Date Noted  . Abdominal pain, epigastric 10/29/2015  . Prolapsed internal hemorrhoids, grade 3 08/12/2015  . IT band syndrome 07/23/2015  . Partial tear of rotator cuff 12/04/2014  . Well adult exam 07/03/2014  . Jock itch 07/03/2014  . Dyslipidemia  03/06/2014  . Ejection fraction   . Diverticulitis of colon without hemorrhage 08/31/2013  . Mitral  regurgitation   . Tricuspid regurgitation   . HX: anticoagulation   . Abdominal distention 01/23/2013  . Diffuse anterior T-wave inversions 10/24/2012  . PPM-[employer] 07/16/2012  . 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 Shortness of breath   . Nausea   . Aortic stenosis   . Routine health maintenance 04/11/2012  . Plantar fasciitis of right foot 04/04/2012  . Tumescence 02/23/2012  . Numbness of toes   . CAD (coronary artery disease)   . Hx of CABG   . Drug therapy   . HTN (hypertension)   . Drug intolerance   . Pulmonic stenosis   . Aortic insufficiency   . Oral anticoagulation   . Hypokalemia   . A-fib ([place])   . QT prolongation on Tikosyn 03/26/2011  . PVC's (premature ventricular contractions)   . Carotid artery disease ([place])   . Elevated bilirubin   . THYROID STIMULATING HORMONE, ABNORMAL 04/01/2010  . DERANGEMENT OF MENISCUS NOT ELSEWHERE CLASSIFIED 12/03/2009  . Knee pain 12/03/2009  . BENIGN PROSTATIC HYPERTROPHY, WITH OBSTRUCTION 09/30/2009  . Disorders of bursae and tendons in shoulder region, unspecified 08/22/2009  . 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 ADENOMATOUS COLONIC POLYP 03/06/2008  . ANXIETY DEPRESSION 03/06/2008  . ESOPHAGITIS 03/06/2008  . ESOPHAGEAL STENOSIS 03/06/2008  . BARRETTS ESOPHAGUS 03/06/2008  . GASTRITIS, CHRONIC 03/06/2008  . DUODENITIS 03/06/2008  . DIVERTICULOSIS, COLON 03/06/2008  . PROSTATITIS, HX OF 03/06/2008  . OSTEOARTHRITIS, KNEE, LEFT 11/04/2007  . FOOT PAIN, LEFT 11/04/2007  . GERD 08/09/2007  . COLONIC POLYPS, HX OF 08/09/2007    Dollene Cleveland , PT  02/17/2016, 4:37 PM  [facility] [address] [place], [name], [address] Phone: [phone]   Fax:  [phone]  Name: Babak ED Uemura MRN: [id number] Date of Birth: [birth year]

## 2016-02-19 ENCOUNTER — Ambulatory Visit: Payer: PPO

## 2016-02-19 DIAGNOSIS — M25511 Pain in right shoulder: Secondary | ICD-10-CM

## 2016-02-19 DIAGNOSIS — R531 Weakness: Secondary | ICD-10-CM

## 2016-02-19 DIAGNOSIS — H52222 Regular astigmatism, left eye: Secondary | ICD-10-CM | POA: Diagnosis not present

## 2016-02-19 DIAGNOSIS — H5202 Hypermetropia, left eye: Secondary | ICD-10-CM | POA: Diagnosis not present

## 2016-02-19 DIAGNOSIS — H40013 Open angle with borderline findings, low risk, bilateral: Secondary | ICD-10-CM | POA: Diagnosis not present

## 2016-02-19 DIAGNOSIS — Z9889 Other specified postprocedural states: Secondary | ICD-10-CM

## 2016-02-19 NOTE — Therapy (Signed)
[place], [name], [address] Phone: [phone]   Fax:  [phone]  Physical Therapy RECERT/ Re-assessment & Treatment Patient Details  Name: Russell Griffith MRN: [id number] Date of Birth: Apr 19, 1946 Referring Provider: Durward Fortes  Encounter Date: 02/19/2016      PT End of Session - 02/19/16 1605    Visit Number 20   Number of Visits 32   Date for PT Re-Evaluation 04/01/16   PT Start Time 1550   PT Stop Time 1630   PT Time Calculation (min) 40 min   Activity Tolerance Patient tolerated treatment well   Behavior During Therapy [facility] for tasks assessed/performed      Past Medical History  Diagnosis Date  . CAD (coronary artery disease)     Myoview March 2014  . Hyperlipidemia     Low HDL  . GERD (gastroesophageal reflux disease)     Barrett's esophagus  . Colonic polyp   . Aortic stenosis     Mild, echo, April, 2014  . Carotid artery disease ([place])   . Elevated bilirubin     Mild chronic elevation, 2.0 January, 2011 stable  . BPH (benign prostatic hyperplasia)   . Chronic foot pain 2009    Resolved with use of orthotics  . Hemorrhoids     September, 2012  . 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)     Left  lung chest x-ray July, 2013  . A-fib ([place])   . PVC's (premature ventricular contractions)   . Atrial flutter ([place])     Status post ablation  . HTN (hypertension)   . Primary osteoarthritis of left knee     Mild  . HX: anticoagulation   . Diverticulosis   . Tubular adenoma of colon   . Prolapsed internal hemorrhoids, grade 3 08/12/2015    Past Surgical History  Procedure Laterality Date  . Insert / replace / remove pacemaker  07/15/2012    initial placement  . Tonsillectomy  1956  . Coronary artery bypass graft  2000    CABG X5  . Radiofrequency  ablation for heart arrythmia    . Permanent pacemaker insertion N/A 07/15/2012    Procedure: PERMANENT PACEMAKER INSERTION;  Surgeon: Deboraha Sprang, MD;   Location: [facility] CATH LAB;  Service: Cardiovascular;  Laterality: N/A;  . Colonoscopy    . Esophagogastroduodenoscopy    . Hemorrhoid banding      There were no vitals filed for this visit.  Visit Diagnosis:  H/O repair of right rotator cuff - Plan: PT plan of care cert/re-cert  Pain in joint of right shoulder - Plan: PT plan of care cert/re-cert  Weakness - Plan: PT plan of care cert/re-cert      Subjective Assessment - 02/19/16 1558    Subjective Pt reports significant soreness after last visit, but admits to performing exercises at home again and the elliptical again following Mon.'s tx. Pt reports improvements in range and less pain, but still has difficulty with reaching, lifting carrying, and pain with certain movements. Pt notes is is now able to wash and comb his hair, drive his manual car, and cut meat with improved ease.     Currently in Pain? No/denies   Pain Score 0-No pain            [facility] PT Assessment - 02/19/16 0001    Observation/Other Assessments   Focus on Therapeutic Outcomes (FOTO)  42% LIMITED   ROM / Strength   AROM / PROM / Strength AROM;PROM;Strength  AROM   Right Shoulder Flexion 135 Degrees   Right Shoulder ABduction 100 Degrees   Right Shoulder Internal Rotation 80 Degrees  arm at neutral. FIR : unable   Right Shoulder External Rotation 54 Degrees  arm at neutral. FER C7   PROM   Right Shoulder Flexion 135 Degrees   Right Shoulder ABduction 142 Degrees   Right Shoulder Internal Rotation 60 Degrees  @ 90 degrees ABD   Right Shoulder External Rotation 70 Degrees  @ 90 degrees ABD   Strength   Strength Assessment Site Shoulder   Right/Left Shoulder Left   Left Shoulder Flexion 3/5   Left Shoulder ABduction 3-/5   Left Shoulder Internal Rotation 3+/5   Left Shoulder External Rotation 3+/5                     [facility] Adult PT Treatment/Exercise - 02/19/16 0001    Shoulder Exercises: Sidelying   External Rotation AROM;Right;12 reps   x 2    ABduction AROM;12 reps  x 2, elbow bent   ABduction Limitations to 90 degrees only   Shoulder Exercises: Standing   Other Standing Exercises wall slides x 10 x 2   Shoulder Exercises: Pulleys   Flexion 2 minutes   ABduction 2 minutes   Shoulder Exercises: ROM/Strengthening   UBE (Upper Arm Bike) 2 min for, 2 min back    Shoulder Exercises: Stretch   External Rotation Stretch 10 seconds  x 10    Wall Stretch - Flexion Other (comment)  10 x 10 sec   Manual Therapy   Joint Mobilization grade 2-3 inferior,posterior, distraction glides   Passive ROM PROM R SH all directions                 PT Education - 02/19/16 1806    Education provided Yes   Education Details PT POC continued    Person(s) Educated Patient   Methods Explanation;Demonstration   Comprehension Verbalized understanding          PT Short Term Goals - 02/19/16 1627    PT SHORT TERM GOAL #1   Title Pt will be I with initial HEP for continued strengthening and mobility by 01/28/16.    Time 4   Period Weeks   Status Achieved   PT SHORT TERM GOAL #2   Title Pt will be able to demo posture as it related to upper body, shoulder, and cervical spine by 01/28/16    Time 4   Period Weeks   Status Achieved   PT SHORT TERM GOAL #3   Title R shoulder PROM will imrpove to 110 degrees pain-free by 01/28/16.    Time 4   Period Weeks   Status Achieved           PT Long Term Goals - 02/19/16 1628    PT LONG TERM GOAL #1   Title R shoulder AROM will improve to 90 degrees pain-free for improved overhead reaching activities by 02/21/16.   Time 8   Period Weeks   Status Achieved   PT LONG TERM GOAL #2   Title Pt will be able to lift 2# overhead x 10 reps without pain in order to return to pain free functional activities for lifting overhead by 02/21/16.   Time 8   Period Weeks   Status On-going   PT LONG TERM GOAL #3   Title FOTO score will improve from 71% limited to <40% to demo improved function  and mobility by  02/21/16.    Baseline 03/10/16: 42% limited.    Time 8   Period Weeks   Status On-going   PT LONG TERM GOAL #4   Title Pt be indep with advanced HEP for continued strengthening by 04/01/16.    Time 6   Period Weeks   Status New   PT LONG TERM GOAL #5   Title R shoulder strength will improve to 4/5 throughout in order to lift suitcase into overhead bin on airplane by 04/01/16.    Time 6   Period Weeks   Status New   Additional Long Term Goals   Additional Long Term Goals Yes   PT LONG TERM GOAL #6   Title R shoulder ER will improve to 80 degrees AROM in order toparticipate in recreational activities by 04/01/16.    Time 6   Period Weeks   Status New               Plan - 2016-03-10 1606    Clinical Impression Statement Pt has attended 20 visits of outpt PT follow RCT and biceps tendon repair on 12/19/15 by Dr Durward Fortes. Pt has been progressing well toward goals, with all STGs achieved, but continues to be limited in range and strength as noted in objective measures. Added additional long tern goals  per pt's request  for certain functional movements.  Pt would benefit from continued PT 2 times a week for 6 weeks to continue to progress toward goals and return to PLOF.    Pt will benefit from skilled therapeutic intervention in order to improve on the following deficits Decreased coordination;Decreased activity tolerance;Decreased mobility;Decreased knowledge of precautions;Decreased endurance;Decreased range of motion;Impaired UE functional use;Increased muscle spasms;Impaired perceived functional ability;Improper body mechanics;Decreased strength;Postural dysfunction;Pain   PT Frequency 2x / week   PT Duration 6 weeks   PT Treatment/Interventions ADLs/Self Care Home Management;Cryotherapy;Moist Heat;Therapeutic exercise;Therapeutic activities;Functional mobility training;Neuromuscular re-education;Patient/family education;Manual techniques;Passive range of motion;Taping;Dry  needling;Ultrasound   PT Next Visit Plan Progress AROM. Add AROM to HEP    PT Home Exercise Plan  table slides, wall slides, cane flex/ abd standing, and supine cane chest press. ER stretch at wall    Consulted and Agree with Plan of Care Patient          G-Codes - 03/10/2016 1805    Functional Assessment Tool Used FOTO   Functional Limitation Carrying, moving and handling objects   Carrying, Moving and Handling Objects Current Status [phone]) At least 40 percent but less than 60 percent impaired, limited or restricted   Carrying, Moving and Handling Objects Goal Status [id number]) At least 1 percent but less than 20 percent impaired, limited or restricted      Problem List Patient Active Problem List   Diagnosis Date Noted  . Abdominal pain, epigastric 10/29/2015  . Prolapsed internal hemorrhoids, grade 3 08/12/2015  . IT band syndrome 07/23/2015  . Partial tear of rotator cuff 12/04/2014  . Well adult exam 07/03/2014  . Jock itch 07/03/2014  . Dyslipidemia 03/06/2014  . Ejection fraction   . Diverticulitis of colon without hemorrhage 08/31/2013  . Mitral regurgitation   . Tricuspid regurgitation   . HX: anticoagulation   . Abdominal distention 01/23/2013  . Diffuse anterior T-wave inversions 10/24/2012  . PPM-[employer] 07/16/2012  . 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 Shortness of breath   . Nausea   . Aortic stenosis   . Routine health maintenance 04/11/2012  . Plantar fasciitis  of right foot 04/04/2012  . Tumescence 02/23/2012  . Numbness of toes   . CAD (coronary artery disease)   . Hx of CABG   . Drug therapy   . HTN (hypertension)   . Drug intolerance   . Pulmonic stenosis   . Aortic insufficiency   . Oral anticoagulation   . Hypokalemia   . A-fib ([place])   . QT prolongation on Tikosyn 03/26/2011  . PVC's (premature ventricular contractions)   . Carotid artery disease ([place])   .  Elevated bilirubin   . THYROID STIMULATING HORMONE, ABNORMAL 04/01/2010  . DERANGEMENT OF MENISCUS NOT ELSEWHERE CLASSIFIED 12/03/2009  . Knee pain 12/03/2009  . BENIGN PROSTATIC HYPERTROPHY, WITH OBSTRUCTION 09/30/2009  . Disorders of bursae and tendons in shoulder region, unspecified 08/22/2009  . 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 ADENOMATOUS COLONIC POLYP 03/06/2008  . ANXIETY DEPRESSION 03/06/2008  . ESOPHAGITIS 03/06/2008  . ESOPHAGEAL STENOSIS 03/06/2008  . BARRETTS ESOPHAGUS 03/06/2008  . GASTRITIS, CHRONIC 03/06/2008  . DUODENITIS 03/06/2008  . DIVERTICULOSIS, COLON 03/06/2008  . PROSTATITIS, HX OF 03/06/2008  . OSTEOARTHRITIS, KNEE, LEFT 11/04/2007  . FOOT PAIN, LEFT 11/04/2007  . GERD 08/09/2007  . COLONIC POLYPS, HX OF 08/09/2007    Dollene Cleveland, PT 02/19/2016, [place], [name], [address] Phone: [phone]   Fax:  [phone]  Name: Russell Griffith MRN: [id number] Date of Birth: [birth year]

## 2016-02-24 ENCOUNTER — Ambulatory Visit: Payer: PPO | Admitting: Physical Therapy

## 2016-02-24 DIAGNOSIS — R531 Weakness: Secondary | ICD-10-CM

## 2016-02-24 DIAGNOSIS — Z9889 Other specified postprocedural states: Secondary | ICD-10-CM

## 2016-02-24 DIAGNOSIS — M25511 Pain in right shoulder: Secondary | ICD-10-CM | POA: Diagnosis not present

## 2016-02-24 NOTE — Therapy (Signed)
[place]ell [place], [name], [address] Phone: [phone]   Fax:  [phone]  Physical Therapy Treatment  Patient Details  Name: Russell Griffith MRN: [id number] Date of Birth: [birth year] Referring Provider: Durward Fortes  Encounter Date: 02/24/2016      PT End of Session - 02/24/16 1508    Visit Number 21   Number of Visits 32   Date for PT Re-Evaluation 04/01/16   PT Start Time 0301   PT Stop Time 0400   PT Time Calculation (min) 59 min      Past Medical History  Diagnosis Date  . CAD (coronary artery disease)     Myoview March 2014  . Hyperlipidemia     Low HDL  . GERD (gastroesophageal reflux disease)     Barrett's esophagus  . Colonic polyp   . Aortic stenosis     Mild, echo, April, 2014  . Carotid artery disease ([place])   . Elevated bilirubin     Mild chronic elevation, 2.0 January, 2011 stable  . BPH (benign prostatic hyperplasia)   . Chronic foot pain 2009    Resolved with use of orthotics  . Hemorrhoids     September, 2012  . 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 A-fib ([place])   . PVC's (premature ventricular contractions)   . Atrial flutter ([place])     Status post ablation  . HTN (hypertension)   . Primary osteoarthritis of left knee     Mild  . HX: anticoagulation   . Diverticulosis   . Tubular adenoma of colon   . Prolapsed internal hemorrhoids, grade 3 08/12/2015    Past Surgical History  Procedure Laterality Date  . Insert / replace / remove pacemaker  07/15/2012    initial placement  . Tonsillectomy  1956  . Coronary artery bypass graft  2000    CABG X5  . Radiofrequency  ablation for heart arrythmia    . Permanent pacemaker insertion N/A 07/15/2012    Procedure: PERMANENT PACEMAKER INSERTION;  Surgeon: Deboraha Sprang, MD;  Location: [facility] CATH LAB;  Service: Cardiovascular;  Laterality: N/A;  . Colonoscopy    . Esophagogastroduodenoscopy    . Hemorrhoid  banding      There were no vitals filed for this visit.  Visit Diagnosis:  H/O repair of right rotator cuff  Pain in joint of right shoulder  Weakness      Subjective Assessment - 02/24/16 1507    Subjective Sore top of shoulder from doing my exercises   Currently in Pain? No/denies                         [facility] Adult PT Treatment/Exercise - 02/24/16 0001    Shoulder Exercises: Supine   Flexion AROM;15 reps   Shoulder Exercises: Sidelying   External Rotation AROM;Right;12 reps  x 2    ABduction AROM;12 reps  x 2   ABduction Limitations full ROM   Shoulder Exercises: Standing   Flexion AROM;Both;12 reps   Flexion Limitations wall slides    ABduction AROM;Both;12 reps   ABduction Limitations wall slide with pillow case   Shoulder Exercises: Pulleys   Flexion 2 minutes   ABduction 2 minutes   Shoulder Exercises: ROM/Strengthening   UBE (Upper Arm Bike) --   Other ROM/Strengthening Exercises standing wall washing at 90 degrees circles clockwise and counter clockwise 10 x 2 , then  in abduction 10 x 2   Other ROM/Strengthening Exercises standing  cabinet reaching middle and top shelf flexion and abduction 10 x 2 each   Cryotherapy   Number Minutes Cryotherapy 10 Minutes   Cryotherapy Location Shoulder   Type of Cryotherapy Ice pack   Manual Therapy   Manual therapy comments PROM flexion and ER with                 PT Education - 02/24/16 1545    Education provided Yes   Education Details shoulder AROM flexion, abduction, ER on mat   Person(s) Educated Patient   Methods Explanation;Handout   Comprehension Verbalized understanding          PT Short Term Goals - 02/19/16 1627    PT SHORT TERM GOAL #1   Title Pt will be I with initial HEP for continued strengthening and mobility by 01/28/16.    Time 4   Period Weeks   Status Achieved   PT SHORT TERM GOAL #2   Title Pt will be able to demo posture as it related to upper body, shoulder,  and cervical spine by 01/28/16    Time 4   Period Weeks   Status Achieved   PT SHORT TERM GOAL #3   Title R shoulder PROM will imrpove to 110 degrees pain-free by 01/28/16.    Time 4   Period Weeks   Status Achieved           PT Long Term Goals - 02/19/16 1628    PT LONG TERM GOAL #1   Title R shoulder AROM will improve to 90 degrees pain-free for improved overhead reaching activities by 02/21/16.   Time 8   Period Weeks   Status Achieved   PT LONG TERM GOAL #2   Title Pt will be able to lift 2# overhead x 10 reps without pain in order to return to pain free functional activities for lifting overhead by 02/21/16.   Time 8   Period Weeks   Status On-going   PT LONG TERM GOAL #3   Title FOTO score will improve from 71% limited to <40% to demo improved function and mobility by 02/21/16.    Baseline 02/19/16: 42% limited.    Time 8   Period Weeks   Status On-going   PT LONG TERM GOAL #4   Title Pt be indep with advanced HEP for continued strengthening by 04/01/16.    Time 6   Period Weeks   Status New   PT LONG TERM GOAL #5   Title R shoulder strength will improve to 4/5 throughout in order to lift suitcase into overhead bin on airplane by 04/01/16.    Time 6   Period Weeks   Status New   Additional Long Term Goals   Additional Long Term Goals Yes   PT LONG TERM GOAL #6   Title R shoulder ER will improve to 80 degrees AROM in order toparticipate in recreational activities by 04/01/16.    Time 6   Period Weeks   Status New               Plan - 02/24/16 1554    Clinical Impression Statement Pt reports he has already completed full HEP today. Instructed pt in supine/sidelying  and standing AROM exercises  including cabinet reaching and arm circles on wall without increased pain. Pt reports soreness post.     PT Next Visit Plan Continue AROM and progress to resistive bands?  Problem List Patient Active Problem List   Diagnosis Date Noted  . Abdominal pain,  epigastric 10/29/2015  . Prolapsed internal hemorrhoids, grade 3 08/12/2015  . IT band syndrome 07/23/2015  . Partial tear of rotator cuff 12/04/2014  . Well adult exam 07/03/2014  . Jock itch 07/03/2014  . Dyslipidemia 03/06/2014  . Ejection fraction   . Diverticulitis of colon without hemorrhage 08/31/2013  . Mitral regurgitation   . Tricuspid regurgitation   . HX: anticoagulation   . Abdominal distention 01/23/2013  . Diffuse anterior T-wave inversions 10/24/2012  . PPM-[employer] 07/16/2012  . 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 Shortness of breath   . Nausea   . Aortic stenosis   . Routine health maintenance 04/11/2012  . Plantar fasciitis of right foot 04/04/2012  . Tumescence 02/23/2012  . Numbness of toes   . CAD (coronary artery disease)   . Hx of CABG   . Drug therapy   . HTN (hypertension)   . Drug intolerance   . Pulmonic stenosis   . Aortic insufficiency   . Oral anticoagulation   . Hypokalemia   . A-fib ([place])   . QT prolongation on Tikosyn 03/26/2011  . PVC's (premature ventricular contractions)   . Carotid artery disease ([place])   . Elevated bilirubin   . THYROID STIMULATING HORMONE, ABNORMAL 04/01/2010  . DERANGEMENT OF MENISCUS NOT ELSEWHERE CLASSIFIED 12/03/2009  . Knee pain 12/03/2009  . BENIGN PROSTATIC HYPERTROPHY, WITH OBSTRUCTION 09/30/2009  . Disorders of bursae and tendons in shoulder region, unspecified 08/22/2009  . 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 ADENOMATOUS COLONIC POLYP 03/06/2008  . ANXIETY DEPRESSION 03/06/2008  . ESOPHAGITIS 03/06/2008  . ESOPHAGEAL STENOSIS 03/06/2008  . BARRETTS ESOPHAGUS 03/06/2008  . GASTRITIS, CHRONIC 03/06/2008  . DUODENITIS 03/06/2008  . DIVERTICULOSIS, COLON 03/06/2008  . PROSTATITIS, HX OF 03/06/2008  . OSTEOARTHRITIS, KNEE, LEFT 11/04/2007  . FOOT PAIN, LEFT 11/04/2007  . GERD 08/09/2007  . COLONIC  POLYPS, HX OF 08/09/2007    Dorene Ar, PTA 02/24/2016, 4:45 PM  [place], [name], [address] Phone: [phone]   Fax:  [phone]  Name: Donavon ED Hartt MRN: [id number] Date of Birth: [birth year]

## 2016-02-27 ENCOUNTER — Ambulatory Visit: Payer: PPO | Admitting: Physical Therapy

## 2016-02-27 DIAGNOSIS — M25511 Pain in right shoulder: Secondary | ICD-10-CM | POA: Diagnosis not present

## 2016-02-27 DIAGNOSIS — R531 Weakness: Secondary | ICD-10-CM

## 2016-02-27 DIAGNOSIS — Z9889 Other specified postprocedural states: Secondary | ICD-10-CM

## 2016-02-27 NOTE — Patient Instructions (Signed)
Over Head Pull: Narrow Grip       On back, knees bent, feet flat, band across thighs, elbows straight but relaxed. Pull hands apart (start). Keeping elbows straight, bring arms up and over head, hands toward floor. Keep pull steady on band. Hold momentarily. Return slowly, keeping pull steady, back to start. Repeat _15__ times. Band color ___R___   Side Pull: Double Arm   On back, knees bent, feet flat. Arms perpendicular to body, shoulder level, elbows straight but relaxed. Pull arms out to sides, elbows straight. Resistance band comes across collarbones, hands toward floor. Hold momentarily. Slowly return to starting position. Repeat 15___ times. Band color __R___   Sash   On back, knees bent, feet flat, left hand on left hip, right hand above left. Pull right arm DIAGONALLY (hip to shoulder) across chest. Bring right arm along head toward floor. Hold momentarily. Slowly return to starting position. Repeat _15__ times. Do with left arm. Band color ____R__   Shoulder Rotation: Double Arm   On back, knees bent, feet flat, elbows tucked at sides, bent 90, hands palms up. Pull hands apart and down toward floor, keeping elbows near sides. Hold momentarily. Slowly return to starting position. Repeat __15_ times. Band color _R_____   Strengthening: Resisted Internal Rotation   Hold tubing in left hand, elbow at side and forearm out. Rotate forearm in across body. Repeat __15__ times per set. Do __2__ sets per session. Do __2__ sessions per day.  http://orth.exer.us/830   Copyright  VHI. All rights reserved.  Strengthening: Resisted External Rotation   Hold tubing in right hand, elbow at side and forearm across body. Rotate forearm out. Repeat __15__ times per set. Do _2___ sets per session. Do _2___ sessions per day.  http://orth.exer.us/828

## 2016-02-27 NOTE — Therapy (Signed)
[place], [name], [address] Phone: [phone]   Fax:  [phone]  Physical Therapy Treatment  Patient Details  Name: Russell Griffith MRN: [id number] Date of Birth: 06/02/1946 Referring Provider: Durward Fortes  Encounter Date: 02/27/2016      PT End of Session - 02/27/16 1503    Visit Number 22   Number of Visits 32   Date for PT Re-Evaluation 04/01/16   PT Start Time 0217   PT Stop Time 0305   PT Time Calculation (min) 48 min      Past Medical History  Diagnosis Date  . CAD (coronary artery disease)     Myoview March 2014  . Hyperlipidemia     Low HDL  . GERD (gastroesophageal reflux disease)     Barrett's esophagus  . Colonic polyp   . Aortic stenosis     Mild, echo, April, 2014  . Carotid artery disease ([place])   . Elevated bilirubin     Mild chronic elevation, 2.0 January, 2011 stable  . BPH (benign prostatic hyperplasia)   . Chronic foot pain 2009    Resolved with use of orthotics  . Hemorrhoids     September, 2012  . 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 A-fib ([place])   . PVC's (premature ventricular contractions)   . Atrial flutter ([place])     Status post ablation  . HTN (hypertension)   . Primary osteoarthritis of left knee     Mild  . HX: anticoagulation   . Diverticulosis   . Tubular adenoma of colon   . Prolapsed internal hemorrhoids, grade 3 08/12/2015    Past Surgical History  Procedure Laterality Date  . Insert / replace / remove pacemaker  07/15/2012    initial placement  . Tonsillectomy  1956  . Coronary artery bypass graft  2000    CABG X5  . Radiofrequency  ablation for heart arrythmia    . Permanent pacemaker insertion N/A 07/15/2012    Procedure: PERMANENT PACEMAKER INSERTION;  Surgeon: Deboraha Sprang, MD;  Location: [facility] CATH LAB;  Service: Cardiovascular;  Laterality: N/A;  . Colonoscopy    . Esophagogastroduodenoscopy    . Hemorrhoid  banding      There were no vitals filed for this visit.  Visit Diagnosis:  H/O repair of right rotator cuff  Pain in joint of right shoulder  Weakness      Subjective Assessment - 02/27/16 1636    Subjective Sore after last visit then better overall.    Currently in Pain? No/denies                         [facility] Adult PT Treatment/Exercise - 02/27/16 0001    Shoulder Exercises: Supine   Flexion Strengthening;Right;Weights;15 reps   Shoulder Flexion Weight (lbs) 1   Other Supine Exercises supine bicep curl with 2 # x10   Other Supine Exercises supine scap stab series red band x 15 each   Shoulder Exercises: Sidelying   External Rotation Strengthening;Right;15 reps;Weights   External Rotation Weight (lbs) 1   ABduction Strengthening;Right   ABduction Weight (lbs) 1   Shoulder Exercises: Standing   External Rotation 20 reps;Theraband   Theraband Level (Shoulder External Rotation) Level 2 (Red)   Internal Rotation 20 reps;Theraband   Theraband Level (Shoulder Internal Rotation) Level 2 (Red)   Other Standing Exercises punch with rockwood series x  20 red   Shoulder Exercises: ROM/Strengthening   UBE (Upper Arm Bike) 3 min for 3 min back                PT Education - 02/27/16 1503    Education provided Yes   Education Details supine scap stab, ER standing with red, IR standing with red, add 1# to AROM supine    Person(s) Educated Patient   Methods Explanation;Handout   Comprehension Verbalized understanding          PT Short Term Goals - 02/19/16 1627    PT SHORT TERM GOAL #1   Title Pt will be I with initial HEP for continued strengthening and mobility by 01/28/16.    Time 4   Period Weeks   Status Achieved   PT SHORT TERM GOAL #2   Title Pt will be able to demo posture as it related to upper body, shoulder, and cervical spine by 01/28/16    Time 4   Period Weeks   Status Achieved   PT SHORT TERM GOAL #3   Title R shoulder PROM will  imrpove to 110 degrees pain-free by 01/28/16.    Time 4   Period Weeks   Status Achieved           PT Long Term Goals - 02/19/16 1628    PT LONG TERM GOAL #1   Title R shoulder AROM will improve to 90 degrees pain-free for improved overhead reaching activities by 02/21/16.   Time 8   Period Weeks   Status Achieved   PT LONG TERM GOAL #2   Title Pt will be able to lift 2# overhead x 10 reps without pain in order to return to pain free functional activities for lifting overhead by 02/21/16.   Time 8   Period Weeks   Status On-going   PT LONG TERM GOAL #3   Title FOTO score will improve from 71% limited to <40% to demo improved function and mobility by 02/21/16.    Baseline 02/19/16: 42% limited.    Time 8   Period Weeks   Status On-going   PT LONG TERM GOAL #4   Title Pt be indep with advanced HEP for continued strengthening by 04/01/16.    Time 6   Period Weeks   Status New   PT LONG TERM GOAL #5   Title R shoulder strength will improve to 4/5 throughout in order to lift suitcase into overhead bin on airplane by 04/01/16.    Time 6   Period Weeks   Status New   Additional Long Term Goals   Additional Long Term Goals Yes   PT LONG TERM GOAL #6   Title R shoulder ER will improve to 80 degrees AROM in order toparticipate in recreational activities by 04/01/16.    Time 6   Period Weeks   Status New               Plan - 02/27/16 1638    Clinical Impression Statement Progressed pt to supine scap stab with red bands, IR/ER standing with red bands and supine flexion, abduction and ER with 1#. Pt reports no increased pain. Updated HEP.    PT Next Visit Plan Review and check goals.         Problem List Patient Active Problem List   Diagnosis Date Noted  . Abdominal pain, epigastric 10/29/2015  . Prolapsed internal hemorrhoids, grade 3 08/12/2015  . IT band syndrome 07/23/2015  . Partial tear  of rotator cuff 12/04/2014  . Well adult exam 07/03/2014  . Jock itch  07/03/2014  . Dyslipidemia 03/06/2014  . Ejection fraction   . Diverticulitis of colon without hemorrhage 08/31/2013  . Mitral regurgitation   . Tricuspid regurgitation   . HX: anticoagulation   . Abdominal distention 01/23/2013  . Diffuse anterior T-wave inversions 10/24/2012  . PPM-[employer] 07/16/2012  . 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 Shortness of breath   . Nausea   . Aortic stenosis   . Routine health maintenance 04/11/2012  . Plantar fasciitis of right foot 04/04/2012  . Tumescence 02/23/2012  . Numbness of toes   . CAD (coronary artery disease)   . Hx of CABG   . Drug therapy   . HTN (hypertension)   . Drug intolerance   . Pulmonic stenosis   . Aortic insufficiency   . Oral anticoagulation   . Hypokalemia   . A-fib ([place])   . QT prolongation on Tikosyn 03/26/2011  . PVC's (premature ventricular contractions)   . Carotid artery disease ([place])   . Elevated bilirubin   . THYROID STIMULATING HORMONE, ABNORMAL 04/01/2010  . DERANGEMENT OF MENISCUS NOT ELSEWHERE CLASSIFIED 12/03/2009  . Knee pain 12/03/2009  . BENIGN PROSTATIC HYPERTROPHY, WITH OBSTRUCTION 09/30/2009  . Disorders of bursae and tendons in shoulder region, unspecified 08/22/2009  . 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 ADENOMATOUS COLONIC POLYP 03/06/2008  . ANXIETY DEPRESSION 03/06/2008  . ESOPHAGITIS 03/06/2008  . ESOPHAGEAL STENOSIS 03/06/2008  . BARRETTS ESOPHAGUS 03/06/2008  . GASTRITIS, CHRONIC 03/06/2008  . DUODENITIS 03/06/2008  . DIVERTICULOSIS, COLON 03/06/2008  . PROSTATITIS, HX OF 03/06/2008  . OSTEOARTHRITIS, KNEE, LEFT 11/04/2007  . FOOT PAIN, LEFT 11/04/2007  . GERD 08/09/2007  . COLONIC POLYPS, HX OF 08/09/2007    Dorene Ar, PTA 02/27/2016, 4:44 PM  [place], [name], [address] Phone:  [phone]   Fax:  [phone]  Name: Russell Griffith MRN: [id number] Date of Birth: 1946-07-08

## 2016-03-01 ENCOUNTER — Other Ambulatory Visit: Payer: Self-pay | Admitting: Cardiology

## 2016-03-02 ENCOUNTER — Ambulatory Visit: Payer: PPO | Attending: Orthopaedic Surgery

## 2016-03-02 DIAGNOSIS — M6281 Muscle weakness (generalized): Secondary | ICD-10-CM | POA: Insufficient documentation

## 2016-03-02 DIAGNOSIS — M25511 Pain in right shoulder: Secondary | ICD-10-CM | POA: Diagnosis not present

## 2016-03-02 DIAGNOSIS — M25611 Stiffness of right shoulder, not elsewhere classified: Secondary | ICD-10-CM | POA: Insufficient documentation

## 2016-03-02 NOTE — Therapy (Signed)
[place], [name], [address] Phone: [phone]   Fax:  [phone]  Physical Therapy Treatment  Patient Details  Name: Russell Griffith MRN: [id number] Date of Birth: 07/16/1946 Referring Provider: Durward Fortes  Encounter Date: 03/02/2016      PT End of Session - 03/02/16 1601    Visit Number 23   Number of Visits 32   Date for PT Re-Evaluation 04/01/16   PT Start Time [id number]   PT Stop Time 1642   PT Time Calculation (min) 57 min   Activity Tolerance Patient tolerated treatment well   Behavior During Therapy [facility] for tasks assessed/performed      Past Medical History  Diagnosis Date  . CAD (coronary artery disease)     Myoview March 2014  . Hyperlipidemia     Low HDL  . GERD (gastroesophageal reflux disease)     Barrett's esophagus  . Colonic polyp   . Aortic stenosis     Mild, echo, April, 2014  . Carotid artery disease ([place])   . Elevated bilirubin     Mild chronic elevation, 2.0 January, 2011 stable  . BPH (benign prostatic hyperplasia)   . Chronic foot pain 2009    Resolved with use of orthotics  . Hemorrhoids     September, 2012  . 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 A-fib ([place])   . PVC's (premature ventricular contractions)   . Atrial flutter ([place])     Status post ablation  . HTN (hypertension)   . Primary osteoarthritis of left knee     Mild  . HX: anticoagulation   . Diverticulosis   . Tubular adenoma of colon   . Prolapsed internal hemorrhoids, grade 3 08/12/2015    Past Surgical History  Procedure Laterality Date  . Insert / replace / remove pacemaker  07/15/2012    initial placement  . Tonsillectomy  1956  . Coronary artery bypass graft  2000    CABG X5  . Radiofrequency  ablation for heart arrythmia    . Permanent pacemaker insertion N/A 07/15/2012    Procedure: PERMANENT PACEMAKER INSERTION;  Surgeon: Deboraha Sprang, MD;  Location: [facility] CATH LAB;   Service: Cardiovascular;  Laterality: N/A;  . Colonoscopy    . Esophagogastroduodenoscopy    . Hemorrhoid banding      There were no vitals filed for this visit.  Visit Diagnosis:  Pain in joint of right shoulder  Muscle weakness (generalized)      Subjective Assessment - 03/02/16 1553    Subjective No pain, but soreness with theraband exercises.    Currently in Pain? No/denies   Pain Score 0-No pain                         [facility] Adult PT Treatment/Exercise - 03/02/16 0001    Shoulder Exercises: Supine   Protraction 10 reps  x 2   Protraction Weight (lbs) 1#    Flexion Strengthening;Right;Weights;15 reps   Shoulder Flexion Weight (lbs) 1   Other Supine Exercises overhead flexion with 2# cane , 10 sec hold. x 10 reps.    Shoulder Exercises: Seated   Other Seated Exercises UBE 2 mins for/ 2 mins back L2.5    Shoulder Exercises: Sidelying   External Rotation Strengthening;Right;15 reps;Weights   External Rotation Weight (lbs) 1   ABduction Strengthening;Right   ABduction Weight (lbs) 1   Shoulder  Exercises: Standing   External Rotation 20 reps;Theraband   Theraband Level (Shoulder External Rotation) Level 2 (Red)   Internal Rotation 20 reps;Theraband   Theraband Level (Shoulder Internal Rotation) Level 2 (Red)   Extension 20 reps   Theraband Level (Shoulder Extension) Level 3 (Green)   Row 20 reps   Theraband Level (Shoulder Row) Level 3 (Green)   Shoulder Exercises: Pulleys   Flexion 2 minutes   ABduction 2 minutes   Shoulder Exercises: ROM/Strengthening   UBE (Upper Arm Bike) 3 min for 3 min back   Cryotherapy   Number Minutes Cryotherapy 10 Minutes   Cryotherapy Location Shoulder   Type of Cryotherapy Ice pack   Manual Therapy   Joint Mobilization grade 2-3 inferior,posterior, distraction glides   Passive ROM PROM R SH all directions                   PT Short Term Goals - 02/19/16 1627    PT SHORT TERM GOAL #1   Title Pt will be  I with initial HEP for continued strengthening and mobility by 01/28/16.    Time 4   Period Weeks   Status Achieved   PT SHORT TERM GOAL #2   Title Pt will be able to demo posture as it related to upper body, shoulder, and cervical spine by 01/28/16    Time 4   Period Weeks   Status Achieved   PT SHORT TERM GOAL #3   Title R shoulder PROM will imrpove to 110 degrees pain-free by 01/28/16.    Time 4   Period Weeks   Status Achieved           PT Long Term Goals - 03/02/16 1609    PT LONG TERM GOAL #1   Title R shoulder AROM will improve to 90 degrees pain-free for improved overhead reaching activities by 02/21/16.   Time 8   Period Weeks   Status Achieved   PT LONG TERM GOAL #2   Title Pt will be able to lift 2# overhead x 10 reps without pain in order to return to pain free functional activities for lifting overhead by 02/21/16.   Time 8   Period Weeks   Status Achieved   PT LONG TERM GOAL #3   Title FOTO score will improve from 71% limited to <40% to demo improved function and mobility by 02/21/16.    Baseline 02/19/16: 42% limited.    Time 8   Period Weeks   Status On-going   PT LONG TERM GOAL #4   Title Pt be indep with advanced HEP for continued strengthening by 04/01/16.    Time 6   Period Weeks   Status On-going   PT LONG TERM GOAL #5   Title R shoulder strength will improve to 4/5 throughout in order to lift suitcase into overhead bin on airplane by 04/01/16.    Time 6   Period Weeks   Status On-going   PT LONG TERM GOAL #6   Title R shoulder ER will improve to 80 degrees AROM in order toparticipate in recreational activities by 04/01/16.    Time 6   Period Weeks   Status On-going               Plan - 03/02/16 1602    Clinical Impression Statement Continued with progressive strengthening without increased pain, but iwth mm soreness and fatigue. Pt would continue to benefit from skilled physical therapy services to address muscle weakness and shoulder  pain. LTG  # 2 achieved.    PT Next Visit Plan Progressive strength training.    PT Home Exercise Plan  table slides, wall slides, cane flex/ abd standing, and supine cane chest press. ER stretch at wall    Consulted and Agree with Plan of Care Patient        Problem List Patient Active Problem List   Diagnosis Date Noted  . Abdominal pain, epigastric 10/29/2015  . Prolapsed internal hemorrhoids, grade 3 08/12/2015  . IT band syndrome 07/23/2015  . Partial tear of rotator cuff 12/04/2014  . Well adult exam 07/03/2014  . Jock itch 07/03/2014  . Dyslipidemia 03/06/2014  . Ejection fraction   . Diverticulitis of colon without hemorrhage 08/31/2013  . Mitral regurgitation   . Tricuspid regurgitation   . HX: anticoagulation   . Abdominal distention 01/23/2013  . Diffuse anterior T-wave inversions 10/24/2012  . PPM-[employer] 07/16/2012  . 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 Shortness of breath   . Nausea   . Aortic stenosis   . Routine health maintenance 04/11/2012  . Plantar fasciitis of right foot 04/04/2012  . Tumescence 02/23/2012  . Numbness of toes   . CAD (coronary artery disease)   . Hx of CABG   . Drug therapy   . HTN (hypertension)   . Drug intolerance   . Pulmonic stenosis   . Aortic insufficiency   . Oral anticoagulation   . Hypokalemia   . A-fib ([place])   . QT prolongation on Tikosyn 03/26/2011  . PVC's (premature ventricular contractions)   . Carotid artery disease ([place])   . Elevated bilirubin   . THYROID STIMULATING HORMONE, ABNORMAL 04/01/2010  . DERANGEMENT OF MENISCUS NOT ELSEWHERE CLASSIFIED 12/03/2009  . Knee pain 12/03/2009  . BENIGN PROSTATIC HYPERTROPHY, WITH OBSTRUCTION 09/30/2009  . Disorders of bursae and tendons in shoulder region, unspecified 08/22/2009  . 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 ADENOMATOUS COLONIC POLYP 03/06/2008  . ANXIETY DEPRESSION  03/06/2008  . ESOPHAGITIS 03/06/2008  . ESOPHAGEAL STENOSIS 03/06/2008  . BARRETTS ESOPHAGUS 03/06/2008  . GASTRITIS, CHRONIC 03/06/2008  . DUODENITIS 03/06/2008  . DIVERTICULOSIS, COLON 03/06/2008  . PROSTATITIS, HX OF 03/06/2008  . OSTEOARTHRITIS, KNEE, LEFT 11/04/2007  . FOOT PAIN, LEFT 11/04/2007  . GERD 08/09/2007  . COLONIC POLYPS, HX OF 08/09/2007    Dollene Cleveland, PT 03/02/2016, 4:35 PM  [facility] [address] [place], [name], [address] Phone: [phone]   Fax:  [phone]  Name: Russell Griffith MRN: [id number] Date of Birth: 07-22-1946

## 2016-03-04 ENCOUNTER — Ambulatory Visit: Payer: PPO | Admitting: Physical Therapy

## 2016-03-04 DIAGNOSIS — M25511 Pain in right shoulder: Secondary | ICD-10-CM | POA: Diagnosis not present

## 2016-03-04 DIAGNOSIS — M25611 Stiffness of right shoulder, not elsewhere classified: Secondary | ICD-10-CM

## 2016-03-04 DIAGNOSIS — M6281 Muscle weakness (generalized): Secondary | ICD-10-CM

## 2016-03-04 NOTE — Therapy (Signed)
[place], [name], [address] Phone: [phone]   Fax:  [phone]  Physical Therapy Treatment  Patient Details  Name: Russell Griffith MRN: [id number] Date of Birth: 1946/05/04 Referring Provider: Durward Fortes  Encounter Date: 03/04/2016      PT End of Session - 03/04/16 [id number]    Visit Number 24   Number of Visits 32   Date for PT Re-Evaluation 04/01/16   PT Start Time 0350   PT Stop Time 0445   PT Time Calculation (min) 55 min      Past Medical History  Diagnosis Date  . CAD (coronary artery disease)     Myoview March 2014  . Hyperlipidemia     Low HDL  . GERD (gastroesophageal reflux disease)     Barrett's esophagus  . Colonic polyp   . Aortic stenosis     Mild, echo, April, 2014  . Carotid artery disease ([place])   . Elevated bilirubin     Mild chronic elevation, 2.0 January, 2011 stable  . BPH (benign prostatic hyperplasia)   . Chronic foot pain 2009    Resolved with use of orthotics  . Hemorrhoids     September, 2012  . 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 A-fib ([place])   . PVC's (premature ventricular contractions)   . Atrial flutter ([place])     Status post ablation  . HTN (hypertension)   . Primary osteoarthritis of left knee     Mild  . HX: anticoagulation   . Diverticulosis   . Tubular adenoma of colon   . Prolapsed internal hemorrhoids, grade 3 08/12/2015    Past Surgical History  Procedure Laterality Date  . Insert / replace / remove pacemaker  07/15/2012    initial placement  . Tonsillectomy  1956  . Coronary artery bypass graft  2000    CABG X5  . Radiofrequency  ablation for heart arrythmia    . Permanent pacemaker insertion N/A 07/15/2012    Procedure: PERMANENT PACEMAKER INSERTION;  Surgeon: Deboraha Sprang, MD;  Location: [facility] CATH LAB;  Service: Cardiovascular;  Laterality: N/A;  . Colonoscopy    . Esophagogastroduodenoscopy    . Hemorrhoid  banding      There were no vitals filed for this visit.  Visit Diagnosis:  Muscle weakness (generalized)  Pain in joint of right shoulder  Stiffness of right shoulder, not elsewhere classified      Subjective Assessment - 03/04/16 1554    Subjective A little sore   Currently in Pain? No/denies            [facility] PT Assessment - 03/04/16 0001    PROM   Right Shoulder ABduction 145 Degrees   Strength   Strength Assessment Site Shoulder   Right/Left Shoulder Right   Right Shoulder Flexion 4-/5   Right Shoulder Extension 5/5   Right Shoulder ABduction 4-/5   Right Shoulder Internal Rotation 4+/5   Right Shoulder External Rotation 4+/5                     [facility] Adult PT Treatment/Exercise - 03/04/16 0001    Shoulder Exercises: Supine   Protraction 10 reps  x 2   Protraction Weight (lbs) 2   Flexion Strengthening;Right;Weights;15 reps   Shoulder Flexion Weight (lbs) 2   Shoulder Exercises: Sidelying   External Rotation 15 reps   External Rotation Weight (lbs) 2  3 sets   ABduction 15 reps   ABduction Weight (lbs) 2  3 sets   Shoulder Exercises: Standing   Horizontal ABduction Both;10 reps;Theraband   Theraband Level (Shoulder Horizontal ABduction) Level 2 (Red)   External Rotation 20 reps;Theraband   Theraband Level (Shoulder External Rotation) Level 3 (Green)   Internal Rotation 20 reps;Theraband   Theraband Level (Shoulder Internal Rotation) Level 2 (Red)   Flexion AROM;20 reps   ABduction AROM;20 reps   Other Standing Exercises Bicep curls with red band 15 reps x 15   Shoulder Exercises: Pulleys   Flexion 2 minutes   Shoulder Exercises: Stretch   Corner Stretch 30 seconds;2 reps   Cryotherapy   Number Minutes Cryotherapy 10 Minutes   Cryotherapy Location Shoulder   Type of Cryotherapy Ice pack                  PT Short Term Goals - 02/19/16 1627    PT SHORT TERM GOAL #1   Title Pt will be I with initial HEP for continued  strengthening and mobility by 01/28/16.    Time 4   Period Weeks   Status Achieved   PT SHORT TERM GOAL #2   Title Pt will be able to demo posture as it related to upper body, shoulder, and cervical spine by 01/28/16    Time 4   Period Weeks   Status Achieved   PT SHORT TERM GOAL #3   Title R shoulder PROM will imrpove to 110 degrees pain-free by 01/28/16.    Time 4   Period Weeks   Status Achieved           PT Long Term Goals - 03/02/16 1609    PT LONG TERM GOAL #1   Title R shoulder AROM will improve to 90 degrees pain-free for improved overhead reaching activities by 02/21/16.   Time 8   Period Weeks   Status Achieved   PT LONG TERM GOAL #2   Title Pt will be able to lift 2# overhead x 10 reps without pain in order to return to pain free functional activities for lifting overhead by 02/21/16.   Time 8   Period Weeks   Status Achieved   PT LONG TERM GOAL #3   Title FOTO score will improve from 71% limited to <40% to demo improved function and mobility by 02/21/16.    Baseline 02/19/16: 42% limited.    Time 8   Period Weeks   Status On-going   PT LONG TERM GOAL #4   Title Pt be indep with advanced HEP for continued strengthening by 04/01/16.    Time 6   Period Weeks   Status On-going   PT LONG TERM GOAL #5   Title R shoulder strength will improve to 4/5 throughout in order to lift suitcase into overhead bin on airplane by 04/01/16.    Time 6   Period Weeks   Status On-going   PT LONG TERM GOAL #6   Title R shoulder ER will improve to 80 degrees AROM in order toparticipate in recreational activities by 04/01/16.    Time 6   Period Weeks   Status On-going               Plan - 03/04/16 1641    Clinical Impression Statement Progressed Mat strengthening to 2# flexion, abduction ER without increased pain. [place] Bicep curls with red band, some pulling felt in anterior shoulder. Review of supine scap stab, which pt reports  perfroming some in standing. Reminded pt to perform  supine for now. Issued green band for standing IR/ER strengthening. MMT improved. Pt on vacation next week. Will continue his HEP. Added doorway ER stretch to HEP with mild pain and cues to perform gently.   PT Next Visit Plan CHECK GOALS, STREAMLINE HEP, PRGRESSIVE STRENGTHENING        Problem List Patient Active Problem List   Diagnosis Date Noted  . Abdominal pain, epigastric 10/29/2015  . Prolapsed internal hemorrhoids, grade 3 08/12/2015  . IT band syndrome 07/23/2015  . Partial tear of rotator cuff 12/04/2014  . Well adult exam 07/03/2014  . Jock itch 07/03/2014  . Dyslipidemia 03/06/2014  . Ejection fraction   . Diverticulitis of colon without hemorrhage 08/31/2013  . Mitral regurgitation   . Tricuspid regurgitation   . HX: anticoagulation   . Abdominal distention 01/23/2013  . Diffuse anterior T-wave inversions 10/24/2012  . PPM-[employer] 07/16/2012  . 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 Shortness of breath   . Nausea   . Aortic stenosis   . Routine health maintenance 04/11/2012  . Plantar fasciitis of right foot 04/04/2012  . Tumescence 02/23/2012  . Numbness of toes   . CAD (coronary artery disease)   . Hx of CABG   . Drug therapy   . HTN (hypertension)   . Drug intolerance   . Pulmonic stenosis   . Aortic insufficiency   . Oral anticoagulation   . Hypokalemia   . A-fib ([place])   . QT prolongation on Tikosyn 03/26/2011  . PVC's (premature ventricular contractions)   . Carotid artery disease ([place])   . Elevated bilirubin   . THYROID STIMULATING HORMONE, ABNORMAL 04/01/2010  . DERANGEMENT OF MENISCUS NOT ELSEWHERE CLASSIFIED 12/03/2009  . Knee pain 12/03/2009  . BENIGN PROSTATIC HYPERTROPHY, WITH OBSTRUCTION 09/30/2009  . Disorders of bursae and tendons in shoulder region, unspecified 08/22/2009  . 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 ADENOMATOUS COLONIC  POLYP 03/06/2008  . ANXIETY DEPRESSION 03/06/2008  . ESOPHAGITIS 03/06/2008  . ESOPHAGEAL STENOSIS 03/06/2008  . BARRETTS ESOPHAGUS 03/06/2008  . GASTRITIS, CHRONIC 03/06/2008  . DUODENITIS 03/06/2008  . DIVERTICULOSIS, COLON 03/06/2008  . PROSTATITIS, HX OF 03/06/2008  . OSTEOARTHRITIS, KNEE, LEFT 11/04/2007  . FOOT PAIN, LEFT 11/04/2007  . GERD 08/09/2007  . COLONIC POLYPS, HX OF 08/09/2007    Dorene Ar , PTA  03/04/2016, 4:51 PM  [facility] [address] [place], [name], [address] Phone: [phone]   Fax:  [phone]  Name: Russell Griffith MRN: [id number] Date of Birth: September 26, 1946

## 2016-03-04 NOTE — Patient Instructions (Signed)
CHEST: Doorway, Bilateral - Standing    Standing in doorway, place hands on wall with elbows bent at shoulder height. STEP forward. Hold _20-30__ seconds. _3__ reps per set, _2__ sets per day, __7_ days per week

## 2016-03-05 ENCOUNTER — Other Ambulatory Visit: Payer: Self-pay | Admitting: Cardiology

## 2016-03-13 ENCOUNTER — Telehealth: Payer: Self-pay | Admitting: *Deleted

## 2016-03-13 NOTE — Telephone Encounter (Signed)
Patient called stating that he recently returned from a trip to [place] with his family.  He states that since he returned, he has experienced intermittent dizziness with position changes.  Patient states he just finished his workout on the elliptical and noticed the feeling afterwards.  It has since resolved.  He is concerned that going through Emergency planning/management officer at [employer] may have "messed with" his PPM.  Reviewed patient's medications--no changes.  Reviewed remote transmission from yesterday, 03/12/16, no new alerts or abnormalities noted.  Asked if patient could be dehydrated, he states that this is possible.  Patient has no way to check his BP at home, but states he will buy a BP cuff.  Reviewed with Dr. Caryl Comes.  Dr. Caryl Comes advised that patient increase his fluid intake and call his PCP or call our office back if this does not resolve his symptoms.  Patient states he purchased a BP cuff since we got off the phone.  He states that normally at office visits it runs around 120/70.  Advised patient to call us back if his BP is [id number] systolic and he is agreeable.  Patient is appreciative of call and denies additional questions or concerns at this time.

## 2016-03-16 ENCOUNTER — Encounter: Payer: Self-pay | Admitting: Internal Medicine

## 2016-03-16 ENCOUNTER — Encounter: Payer: PPO | Admitting: Physical Therapy

## 2016-03-17 ENCOUNTER — Ambulatory Visit: Payer: PPO | Admitting: Physical Therapy

## 2016-03-17 DIAGNOSIS — M6281 Muscle weakness (generalized): Secondary | ICD-10-CM

## 2016-03-17 DIAGNOSIS — M25511 Pain in right shoulder: Secondary | ICD-10-CM

## 2016-03-17 DIAGNOSIS — M25611 Stiffness of right shoulder, not elsewhere classified: Secondary | ICD-10-CM

## 2016-03-17 NOTE — Therapy (Signed)
[place], [name], [address] Phone: [phone]   Fax:  [phone]  Physical Therapy Treatment  Patient Details  Name: Russell Griffith MRN: [id number] Date of Birth: 1946-09-20 Referring Provider: Durward Fortes  Encounter Date: 03/17/2016      PT End of Session - 03/17/16 1553    Visit Number 25   Number of Visits 32   Date for PT Re-Evaluation 04/01/16   PT Start Time 0350   PT Stop Time 0432   PT Time Calculation (min) 42 min      Past Medical History  Diagnosis Date  . CAD (coronary artery disease)     Myoview March 2014  . Hyperlipidemia     Low HDL  . GERD (gastroesophageal reflux disease)     Barrett's esophagus  . Colonic polyp   . Aortic stenosis     Mild, echo, April, 2014  . Carotid artery disease ([place])   . Elevated bilirubin     Mild chronic elevation, 2.0 January, 2011 stable  . BPH (benign prostatic hyperplasia)   . Chronic foot pain 2009    Resolved with use of orthotics  . Hemorrhoids     September, 2012  . 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 A-fib ([place])   . PVC's (premature ventricular contractions)   . Atrial flutter ([place])     Status post ablation  . HTN (hypertension)   . Primary osteoarthritis of left knee     Mild  . HX: anticoagulation   . Diverticulosis   . Tubular adenoma of colon   . Prolapsed internal hemorrhoids, grade 3 08/12/2015    Past Surgical History  Procedure Laterality Date  . Insert / replace / remove pacemaker  07/15/2012    initial placement  . Tonsillectomy  1956  . Coronary artery bypass graft  2000    CABG X5  . Radiofrequency  ablation for heart arrythmia    . Permanent pacemaker insertion N/A 07/15/2012    Procedure: PERMANENT PACEMAKER INSERTION;  Surgeon: Deboraha Sprang, MD;  Location: [facility] CATH LAB;  Service: Cardiovascular;  Laterality: N/A;  . Colonoscopy    . Esophagogastroduodenoscopy    . Hemorrhoid  banding      There were no vitals filed for this visit.      Subjective Assessment - 03/17/16 1552    Subjective I still have soreness   Currently in Pain? No/denies            [facility] PT Assessment - 03/17/16 0001    AROM   Right Shoulder Flexion 140 Degrees   Right Shoulder ABduction 142 Degrees   Right Shoulder Internal Rotation --  reach to L2   Right Shoulder External Rotation --  Reach to T-2   PROM   Right Shoulder Flexion 150 Degrees  left 155   Strength   Right Shoulder Flexion 4+/5   Right Shoulder Extension 5/5   Right Shoulder ABduction 4+/5   Right Shoulder Internal Rotation 4+/5   Right Shoulder External Rotation 4+/5                     [facility] Adult PT Treatment/Exercise - 03/17/16 0001    Shoulder Exercises: Standing   Horizontal ABduction Both;10 reps;Theraband   Theraband Level (Shoulder Horizontal ABduction) Level 3 (Green)   External Rotation 20 reps;Theraband   Theraband Level (Shoulder External Rotation) Level 3 (Green)   Flexion  Strengthening;15 reps  standing 2 sets   Shoulder Flexion Weight (lbs) 2   ABduction Strengthening;15 reps  standing 2 sets   Shoulder ABduction Weight (lbs) 2   Other Standing Exercises diagonals with green band x 10 then reduced to red band due to fatigue with green band   Other Standing Exercises Standing throwing and catching red ball at rebounder to simulate baseball practice with grandson.    Shoulder Exercises: Pulleys   Flexion 2 minutes   ABduction 2 minutes   Shoulder Exercises: ROM/Strengthening   UBE (Upper Arm Bike) 3 min for 3 min back level 3   Shoulder Exercises: Stretch   Corner Stretch 30 seconds;2 reps   Corner Stretch Limitations single and bilateral arm   Wall Stretch - Flexion 1 rep;30 seconds   Wall Stretch - Flexion Limitations over head in doorway                  PT Short Term Goals - 02/19/16 1627    PT SHORT TERM GOAL #1   Title Pt will be I with initial HEP  for continued strengthening and mobility by 01/28/16.    Time 4   Period Weeks   Status Achieved   PT SHORT TERM GOAL #2   Title Pt will be able to demo posture as it related to upper body, shoulder, and cervical spine by 01/28/16    Time 4   Period Weeks   Status Achieved   PT SHORT TERM GOAL #3   Title R shoulder PROM will imrpove to 110 degrees pain-free by 01/28/16.    Time 4   Period Weeks   Status Achieved           PT Long Term Goals - 03/17/16 1651    PT LONG TERM GOAL #1   Title R shoulder AROM will improve to 90 degrees pain-free for improved overhead reaching activities by 02/21/16.   Status Achieved   PT LONG TERM GOAL #2   Title Pt will be able to lift 2# overhead x 10 reps without pain in order to return to pain free functional activities for lifting overhead by 02/21/16.   Status Achieved   PT LONG TERM GOAL #3   Title FOTO score will improve from 71% limited to <40% to demo improved function and mobility by 02/21/16.    Baseline 02/19/16: 42% limited.    Time 8   Period Weeks   Status On-going   PT LONG TERM GOAL #4   Title Pt be indep with advanced HEP for continued strengthening by 04/01/16.    Time 6   Period Weeks   Status On-going   PT LONG TERM GOAL #5   Title R shoulder strength will improve to 4/5 throughout in order to lift suitcase into overhead bin on airplane by 04/01/16.    Time 6   Status Achieved   PT LONG TERM GOAL #6   Title R shoulder ER will improve to 80 degrees AROM in order toparticipate in recreational activities by 04/01/16.    Status Achieved               Plan - 03/17/16 1638    Clinical Impression Statement Pt on vacation last week. He took therabands and weights to perform HEP on vacation. He deomonstrates improved shoulder strength and ROM. He was able to use right UE to assist using left UE to place suitcase in overhead compartment on airplane. His ER @ 90 degrees abduction as improved  to 80 degrees actively and 85 degrees  passively. We simulated baseball throw and catch with reboumder and red band x 25 with no incresaed pain and good technique. Progressed pt to standing forward and lateral raises as well as standing green theraband exercises. Instructed pt in sigle arm external rotation stretch in door way at 90 degrees abduction, cues for gentle stretch. Also instructed pt in over head doorway stetch for flexion as pt reports he does not feel stretch with wall slide anymore. LTG#5, #6 MET. Pt to bring HEP for streamline next visit as well as any  questions. Needs FOTO score and to be independent with HEP prior to discharge.   PT Next Visit Plan Review standing PREs for advanced HEP. recheck internal rotation stretch, may be ready for discharge, needs FOTO if discharging.       Patient will benefit from skilled therapeutic intervention in order to improve the following deficits and impairments:     Visit Diagnosis: Muscle weakness (generalized)  Pain in joint of right shoulder  Stiffness of right shoulder, not elsewhere classified     Problem List Patient Active Problem List   Diagnosis Date Noted  . Abdominal pain, epigastric 10/29/2015  . Prolapsed internal hemorrhoids, grade 3 08/12/2015  . IT band syndrome 07/23/2015  . Partial tear of rotator cuff 12/04/2014  . Well adult exam 07/03/2014  . Jock itch 07/03/2014  . Dyslipidemia 03/06/2014  . Ejection fraction   . Diverticulitis of colon without hemorrhage 08/31/2013  . Mitral regurgitation   . Tricuspid regurgitation   . HX: anticoagulation   . Abdominal distention 01/23/2013  . Diffuse anterior T-wave inversions 10/24/2012  . PPM-[employer] 07/16/2012  . 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 Shortness of breath   . Nausea   . Aortic stenosis   . Routine health maintenance 04/11/2012  . Plantar fasciitis of right foot 04/04/2012  . Tumescence 02/23/2012  .  Numbness of toes   . CAD (coronary artery disease)   . Hx of CABG   . Drug therapy   . HTN (hypertension)   . Drug intolerance   . Pulmonic stenosis   . Aortic insufficiency   . Oral anticoagulation   . Hypokalemia   . A-fib ([place])   . QT prolongation on Tikosyn 03/26/2011  . PVC's (premature ventricular contractions)   . Carotid artery disease ([place])   . Elevated bilirubin   . THYROID STIMULATING HORMONE, ABNORMAL 04/01/2010  . DERANGEMENT OF MENISCUS NOT ELSEWHERE CLASSIFIED 12/03/2009  . Knee pain 12/03/2009  . BENIGN PROSTATIC HYPERTROPHY, WITH OBSTRUCTION 09/30/2009  . Disorders of bursae and tendons in shoulder region, unspecified 08/22/2009  . 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 ADENOMATOUS COLONIC POLYP 03/06/2008  . ANXIETY DEPRESSION 03/06/2008  . ESOPHAGITIS 03/06/2008  . ESOPHAGEAL STENOSIS 03/06/2008  . BARRETTS ESOPHAGUS 03/06/2008  . GASTRITIS, CHRONIC 03/06/2008  . DUODENITIS 03/06/2008  . DIVERTICULOSIS, COLON 03/06/2008  . PROSTATITIS, HX OF 03/06/2008  . OSTEOARTHRITIS, KNEE, LEFT 11/04/2007  . FOOT PAIN, LEFT 11/04/2007  . GERD 08/09/2007  . COLONIC POLYPS, HX OF 08/09/2007    Dorene Ar, PTA 03/17/2016, 4:59 PM  [place], [name], [address] Phone: [phone]   Fax:  [phone]  Name: Russell Griffith MRN: [id number] Date of Birth: [birth year]

## 2016-03-20 ENCOUNTER — Ambulatory Visit: Payer: PPO | Admitting: Physical Therapy

## 2016-03-20 DIAGNOSIS — M25511 Pain in right shoulder: Secondary | ICD-10-CM

## 2016-03-20 DIAGNOSIS — M25611 Stiffness of right shoulder, not elsewhere classified: Secondary | ICD-10-CM

## 2016-03-20 DIAGNOSIS — M6281 Muscle weakness (generalized): Secondary | ICD-10-CM

## 2016-03-20 NOTE — Therapy (Signed)
[place], [name], [address] Phone: [phone]   Fax:  [phone]  Physical Therapy Treatment  Patient Details  Name: Russell Griffith MRN: [id number] Date of Birth: Sep 09, 1946 Referring Provider: Durward Fortes  Encounter Date: 03/20/2016      PT End of Session - 03/20/16 1049    Visit Number 26   Number of Visits 32   Date for PT Re-Evaluation 04/01/16   PT Start Time 1020   PT Stop Time 1055   PT Time Calculation (min) 35 min   Activity Tolerance Patient tolerated treatment well   Behavior During Therapy [facility] for tasks assessed/performed      Past Medical History  Diagnosis Date  . CAD (coronary artery disease)     Myoview March 2014  . Hyperlipidemia     Low HDL  . GERD (gastroesophageal reflux disease)     Barrett's esophagus  . Colonic polyp   . Aortic stenosis     Mild, echo, April, 2014  . Carotid artery disease ([place])   . Elevated bilirubin     Mild chronic elevation, 2.0 January, 2011 stable  . BPH (benign prostatic hyperplasia)   . Chronic foot pain 2009    Resolved with use of orthotics  . Hemorrhoids     September, 2012  . 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 A-fib ([place])   . PVC's (premature ventricular contractions)   . Atrial flutter ([place])     Status post ablation  . HTN (hypertension)   . Primary osteoarthritis of left knee     Mild  . HX: anticoagulation   . Diverticulosis   . Tubular adenoma of colon   . Prolapsed internal hemorrhoids, grade 3 08/12/2015    Past Surgical History  Procedure Laterality Date  . Insert / replace / remove pacemaker  07/15/2012    initial placement  . Tonsillectomy  1956  . Coronary artery bypass graft  2000    CABG X5  . Radiofrequency  ablation for heart arrythmia    . Permanent pacemaker insertion N/A 07/15/2012    Procedure: PERMANENT PACEMAKER INSERTION;  Surgeon: Deboraha Sprang, MD;  Location: [facility] CATH LAB;   Service: Cardiovascular;  Laterality: N/A;  . Colonoscopy    . Esophagogastroduodenoscopy    . Hemorrhoid banding      There were no vitals filed for this visit.      Subjective Assessment - 03/20/16 1020    Subjective Just came from [place] and he released me. I only get a little sore after I work out.    Currently in Pain? No/denies            [facility] PT Assessment - 03/20/16 1028    AROM   Right Shoulder Flexion 140 Degrees   Right Shoulder ABduction 142 Degrees   Right Shoulder Internal Rotation 60 Degrees  L1   Right Shoulder External Rotation 65 Degrees  T2            PT Education - 03/20/16 1057    Education provided Yes   Education Details streamlined HEP and correct technique for blue band    Person(s) Educated Patient   Methods Explanation;Demonstration;Tactile cues;Verbal cues   Comprehension Verbalized understanding;Returned demonstration         [facility] Adult PT Treatment/Exercise - 03/20/16 1201    Self-Care   Other Self-Care Comments  verbally reviewed HEP and prioritized HEP, posture and prevention  of L shoulder injury.    Shoulder Exercises: Standing   Horizontal ABduction Both;10 reps;Theraband   Theraband Level (Shoulder Horizontal ABduction) Level 3 (Green)   External Rotation 20 reps;Theraband  unattached   Theraband Level (Shoulder External Rotation) Level 3 (Green)   Flexion Strengthening;Right;10 reps   Theraband Level (Shoulder Flexion) Level 4 (Blue)   ABduction Strengthening;Right;10 reps;Theraband   Theraband Level (Shoulder ABduction) Level 4 (Blue)   Shoulder Exercises: ROM/Strengthening   UBE (Upper Arm Bike) 3 min forward, 3 min back level 5   Shoulder Exercises: Stretch   Corner Stretch 2 reps;30 seconds   Internal Rotation Stretch 30 seconds   Internal Rotation Stretch Limitations towel   Other Shoulder Stretches sidelying sleeper stretch 30 sec x 5          PT Short Term Goals - 02/19/16 1627    PT SHORT TERM GOAL #1    Title Pt will be I with initial HEP for continued strengthening and mobility by 01/28/16.    Time 4   Period Weeks   Status Achieved   PT SHORT TERM GOAL #2   Title Pt will be able to demo posture as it related to upper body, shoulder, and cervical spine by 01/28/16    Time 4   Period Weeks   Status Achieved   PT SHORT TERM GOAL #3   Title R shoulder PROM will imrpove to 110 degrees pain-free by 01/28/16.    Time 4   Period Weeks   Status Achieved           PT Long Term Goals - 03-23-16 1049    PT LONG TERM GOAL #1   Title R shoulder AROM will improve to 90 degrees pain-free for improved overhead reaching activities by 02/21/16.   Status Achieved   PT LONG TERM GOAL #2   Title Pt will be able to lift 2# overhead x 10 reps without pain in order to return to pain free functional activities for lifting overhead by 02/21/16.   Status Achieved   PT LONG TERM GOAL #3   Title FOTO score will improve from 71% limited to <40% to demo improved function and mobility by 02/21/16.    Baseline 02/19/16: 42% limited. , 03-24-2023 21 %   Status Achieved   PT LONG TERM GOAL #4   Title Pt be indep with advanced HEP for continued strengthening by 04/01/16.    Status Achieved   PT LONG TERM GOAL #5   Title R shoulder strength will improve to 4/5 throughout in order to lift suitcase into overhead bin on airplane by 04/01/16.    Status Achieved   PT LONG TERM GOAL #6   Title R shoulder ER will improve to 80 degrees AROM in order toparticipate in recreational activities by 04/01/16.    Status Achieved             Patient will benefit from skilled therapeutic intervention in order to improve the following deficits and impairments:     Visit Diagnosis: Muscle weakness (generalized)  Pain in joint of right shoulder  Stiffness of right shoulder, not elsewhere classified       G-Codes - Mar 23, 2016 1056    Functional Assessment Tool Used FOTO   Functional Limitation Carrying, moving and handling  objects   Carrying, Moving and Handling Objects Current Status (G5498) At least 20 percent but less than 40 percent impaired, limited or restricted   Carrying, Moving and Handling Objects Goal Status (Y6415) At least 1  percent but less than 20 percent impaired, limited or restricted   Carrying, Moving and Handling Objects Discharge Status [phone]) At least 20 percent but less than 40 percent impaired, limited or restricted      Problem List Patient Active Problem List   Diagnosis Date Noted  . Abdominal pain, epigastric 10/29/2015  . Prolapsed internal hemorrhoids, grade 3 08/12/2015  . IT band syndrome 07/23/2015  . Partial tear of rotator cuff 12/04/2014  . Well adult exam 07/03/2014  . Jock itch 07/03/2014  . Dyslipidemia 03/06/2014  . Ejection fraction   . Diverticulitis of colon without hemorrhage 08/31/2013  . Mitral regurgitation   . Tricuspid regurgitation   . HX: anticoagulation   . Abdominal distention 01/23/2013  . Diffuse anterior T-wave inversions 10/24/2012  . PPM-[employer] 07/16/2012  . 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 Shortness of breath   . Nausea   . Aortic stenosis   . Routine health maintenance 04/11/2012  . Plantar fasciitis of right foot 04/04/2012  . Tumescence 02/23/2012  . Numbness of toes   . CAD (coronary artery disease)   . Hx of CABG   . Drug therapy   . HTN (hypertension)   . Drug intolerance   . Pulmonic stenosis   . Aortic insufficiency   . Oral anticoagulation   . Hypokalemia   . A-fib ([place])   . QT prolongation on Tikosyn 03/26/2011  . PVC's (premature ventricular contractions)   . Carotid artery disease ([place])   . Elevated bilirubin   . THYROID STIMULATING HORMONE, ABNORMAL 04/01/2010  . DERANGEMENT OF MENISCUS NOT ELSEWHERE CLASSIFIED 12/03/2009  . Knee pain 12/03/2009  . BENIGN PROSTATIC HYPERTROPHY, WITH OBSTRUCTION 09/30/2009  . Disorders of  bursae and tendons in shoulder region, unspecified 08/22/2009  . 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 ADENOMATOUS COLONIC POLYP 03/06/2008  . ANXIETY DEPRESSION 03/06/2008  . ESOPHAGITIS 03/06/2008  . ESOPHAGEAL STENOSIS 03/06/2008  . BARRETTS ESOPHAGUS 03/06/2008  . GASTRITIS, CHRONIC 03/06/2008  . DUODENITIS 03/06/2008  . DIVERTICULOSIS, COLON 03/06/2008  . PROSTATITIS, HX OF 03/06/2008  . OSTEOARTHRITIS, KNEE, LEFT 11/04/2007  . FOOT PAIN, LEFT 11/04/2007  . GERD 08/09/2007  . COLONIC POLYPS, HX OF 08/09/2007    PAA,JENNIFER 03/20/2016, 12:04 PM  [facility] [address] [place], [name], [address] Phone: [phone]   Fax:  [phone]  Name: Russell Griffith MRN: [id number] Date of Birth: 31-May-1946    Raeford Razor, PT 03/20/2016 12:04 PM Phone: [phone] Fax: [phone]   PHYSICAL THERAPY DISCHARGE SUMMARY  Visits from Start of Care: 26  Current functional level related to goals / functional outcomes: See above   Remaining deficits: End range AROM, strength 4+/5 in general Rt. UE    Education / Equipment: HEP, posture, RICE   Plan: Patient agrees to discharge.  Patient goals were met. Patient is being discharged due to meeting the stated rehab goals.  ?????   Raeford Razor, PT 03/20/2016 12:06 PM Phone: [phone] Fax: [phone]

## 2016-03-23 ENCOUNTER — Other Ambulatory Visit: Payer: Self-pay

## 2016-03-23 MED ORDER — PANTOPRAZOLE SODIUM 40 MG PO TBEC: 40.0000 mg | DELAYED_RELEASE_TABLET | Freq: Every day | ORAL | Status: DC

## 2016-03-24 ENCOUNTER — Encounter: Payer: PPO | Admitting: Physical Therapy

## 2016-03-26 ENCOUNTER — Encounter: Payer: PPO | Admitting: Physical Therapy

## 2016-04-13 ENCOUNTER — Ambulatory Visit (INDEPENDENT_AMBULATORY_CARE_PROVIDER_SITE_OTHER): Payer: PPO | Admitting: *Deleted

## 2016-04-13 DIAGNOSIS — I495 Sick sinus syndrome: Secondary | ICD-10-CM | POA: Diagnosis not present

## 2016-04-14 NOTE — Progress Notes (Signed)
Remote pacemaker transmission.   

## 2016-05-07 LAB — CUP PACEART REMOTE DEVICE CHECK
Brady Statistic RV Percent Paced: 23 %
Implantable Lead Implant Date: 20130816
Implantable Lead Location: 753859
Implantable Lead Model: 5076
Lead Channel Impedance Value: 552 Ohm
Lead Channel Pacing Threshold Amplitude: 0.6 V
Lead Channel Pacing Threshold Pulse Width: 0.4 ms
Lead Channel Setting Pacing Amplitude: 2.4 V
Lead Channel Setting Pacing Pulse Width: 0.7 ms
Lead Channel Setting Sensing Sensitivity: 2.5 mV
MDC IDC LEAD IMPLANT DT: 20130816
MDC IDC LEAD LOCATION: 753860
MDC IDC MSMT BATTERY REMAINING LONGEVITY: 102 mo
MDC IDC MSMT BATTERY REMAINING PERCENTAGE: 100 %
MDC IDC MSMT LEADCHNL RV IMPEDANCE VALUE: 493 Ohm
MDC IDC SESS DTM: 20170516053200
MDC IDC SET LEADCHNL RA PACING AMPLITUDE: 2 V
MDC IDC STAT BRADY RA PERCENT PACED: 71 %
Pulse Gen Serial Number: 111255

## 2016-05-13 ENCOUNTER — Encounter: Payer: Self-pay | Admitting: Cardiology

## 2016-05-27 ENCOUNTER — Encounter: Payer: Self-pay | Admitting: Cardiology

## 2016-06-28 NOTE — Progress Notes (Signed)
Cardiology Office Note Date:  06/29/2016  Patient ID:  Russell Griffith, DOB 1945/12/28, MRN [id number] PCP:  Walker Kehr, MD  Cardiologist:  Dr. Percival Spanish Electrophysiologist: Dr. Caryl Comes  Chief Complaint:  Planned Pacer visit  History of Present Illness: Russell Reichmann ED Huhn is a 70 y.o. male with history of CAD (CABG 2000), PAFib s/p PVI ablation at [facility] (he thinks about 2008), and an AFlutter ablation with dr. Lovena Le 2003, sinus node dysfunction with PPM, HTN, HLD, PVD/carotids, comes to the office today to be seen for Dr. Caryl Comes.  He was last seen by cardiology service Dr. Percival Spanish in January at that ti,e doing well from his standpoint, last seen by EP service, Dr. Caryl Comes July 2016, doing well from pacer/EP standpoint.  He comes today feeling very well, only c/o arthritic knee pain, exercises routinely on the eliptical bike without CP, palpitations, no dizziness, near syncope or syncope. exertional intolerances.  He reports no bleeding or signs of bleeding.  AFIB history: Eliquis Tikosyn 2012  Unclear when/why stopped, ?prolonged QT PVI ablation at  [facility] (+/- 2008) AFlutter ablation, 2003, Dr. Lovena Le   Past Medical History:  Diagnosis Date  . A-fib ([place])   . Aortic stenosis    Mild, echo, April, 2014  . Atrial flutter ([place])    Status post ablation  . BPH (benign prostatic hyperplasia)   . CAD (coronary artery disease)    Myoview March 2014  . Carotid artery disease ([place])   . Chronic foot pain 2009   Resolved with use of orthotics  . Colonic polyp   . Diverticulosis   . Elevated bilirubin    Mild chronic elevation, 2.0 January, 2011 stable  . GERD (gastroesophageal reflux disease)    Barrett's esophagus  . Hemorrhoids    September, 2012  . HTN (hypertension)   . HX: anticoagulation   . Hyperlipidemia    Low HDL  . 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 Primary osteoarthritis of left knee    Mild  . Prolapsed internal hemorrhoids, grade 3 08/12/2015   . PVC's (premature ventricular contractions)   . Tubular adenoma of colon     Past Surgical History:  Procedure Laterality Date  . COLONOSCOPY    . CORONARY ARTERY BYPASS GRAFT  2000   CABG X5  . ESOPHAGOGASTRODUODENOSCOPY    . HEMORRHOID BANDING    . INSERT / REPLACE / REMOVE PACEMAKER  07/15/2012   initial placement  . PERMANENT PACEMAKER INSERTION N/A 07/15/2012   Procedure: PERMANENT PACEMAKER INSERTION;  Surgeon: Deboraha Sprang, MD;  Location: [facility] CATH LAB;  Service: Cardiovascular;  Laterality: N/A;  . radiofrequency  ablation for heart arrythmia    . TONSILLECTOMY  1956    Current Outpatient Prescriptions  Medication Sig Dispense Refill  . apixaban (ELIQUIS) 5 MG TABS tablet Take 1 tablet (5 mg total) by mouth 2 (two) times daily. 60 tablet 11  . atorvastatin (LIPITOR) 20 MG tablet TAKE 1 TABLET ONCE DAILY. 30 tablet 6  . cholecalciferol (VITAMIN D) 1000 UNITS tablet Take 1 tablet (1,000 Units total) by mouth daily. 100 tablet 3  . pantoprazole (PROTONIX) 40 MG tablet Take 1 tablet (40 mg total) by mouth daily. 90 tablet 3  . ramipril (ALTACE) 2.5 MG capsule TAKE (1) CAPSULE DAILY. 30 capsule 11   No current facility-administered medications for this visit.     Allergies:   Cayenne and Sulfonamide derivatives   Social History:  The patient  reports that he has never smoked. He has never used smokeless tobacco. He reports that he drinks about 12.6 oz of alcohol per week . He reports that he does not use drugs.   Family History:  The patient's family history includes Coronary artery disease (age of onset: 44) in his other; Diabetes in his father and other; Multiple myeloma in his mother; Renal Disease in his father.  ROS:  Please see the history of present illness.    All other systems are reviewed and otherwise negative.   PHYSICAL EXAM:  VS:  BP 124/72 (BP Location: Left Arm, Patient Position: Sitting, Cuff Size: Normal)   Pulse (!) 55   Ht '5\' 11"'  (1.803 m)   Wt 201  lb (91.2 kg)   BMI 28.03 kg/m  BMI: Body mass index is 28.03 kg/m. Well nourished, well developed, in no acute distress  HEENT: normocephalic, atraumatic  Neck: no JVD, carotid bruits or masses Cardiac:  normal S1, S2; RRR; no significant murmurs, no rubs, or gallops Lungs:  clear to auscultation bilaterally, no wheezing, rhonchi or rales  Abd: soft, nontender, MS: no deformity or atrophy Ext: no edema  Skin: warm and dry, no rash Neuro:  No gross deficits appreciated Psych: euthymic mood, full affect  PPM site is stable, no tethering or discomfort   EKG:  Done today and reviewed by myself shows A paced, V sensed PPM iterrogation today shows normal function, no changes were made, one 6beat NSVT, other NSVT episodes appear to be AT in review with industry representative  01/23/13: Echocardiogram Study Conclusions - Left ventricle: Septal hypokinesis The cavity size was mildly dilated. Systolic function was normal. The estimated ejection fraction was in the range of 50% to 55%. - Aortic valve: Small gradient across calcified AV - Mitral valve: Mild regurgitation. - Left atrium: The atrium was moderately dilated. - Right atrium: The atrium was moderately dilated. - Tricuspid valve: Mild-moderate regurgitation.  05/22/15: Carotid US Heterogeneous plaque, bilaterally. 1-39% RICA stenosis. Stable 89-16% LICA stenosis. Patent vertebral arteries with antegrade flow. Normal subclavian arteries, bilaterally.  12/03/15: ETT The patient exercised according to the CVN-BRUCE for 09:44 min:s, achieving a work level of Max. METS: 11.2. The resting heart rate of 55 bpm rose to a maximal heart rate of 136 bpm. This value represents 88 % of the maximal, age-predicted heart rate. The resting blood pressure of 143/73 mmHg , rose to a maximum blood pressure of 222/67 mmHg. The exercise test was stopped due to sob, Fatigue Summary: ST Changes: NO SIGN ST-T CHANGES.  Recent  Labs: 07/30/2015: TSH 6.20 10/10/2015: Brain Natriuretic Peptide 84.6 10/29/2015: ALT 24; BUN 20; Creatinine, Ser 1.12; Hemoglobin 14.8; Platelets 177.0; Potassium 4.5; Sodium 142  07/30/2015: Cholesterol 114; HDL 28.90; LDL Cholesterol 70; Total CHOL/HDL Ratio 4; Triglycerides 73.0; VLDL 14.6   CrCl cannot be calculated (Patient's most recent lab result is older than the maximum 21 days allowed.).   Wt Readings from Last 3 Encounters:  06/29/16 201 lb (91.2 kg)  12/12/15 199 lb 4.8 oz (90.4 kg)  12/06/15 198 lb (89.8 kg)     Other studies reviewed: Additional studies/records reviewed today include: summarized above  DEVICE information: BSci dual chamber PPM implanted 07/15/12, Dr. Lovena Le  ASSESSMENT AND PLAN:  1. PAFib, s/p PVI ablation and AFlutter ablation     CHA2DS2Vasc is at least 3 on Eliquis  2. PPM, sinus node dysfunction     normal device function     q3 mo remotes  3. CAD/PVD     On statin     No symptoms     C/w Dr. Percival Spanish  Disposition: F/u with q 3 month remote checks, he is getting annual labs in September with his PMD  Current medicines are reviewed at length with the patient today.  The patient did not have any concerns regarding medicines.  Haywood Lasso, PA-C 06/29/2016 1:02 PM     [address] [place] [address] [phone] (office)  [phone] (fax)

## 2016-06-29 ENCOUNTER — Ambulatory Visit (INDEPENDENT_AMBULATORY_CARE_PROVIDER_SITE_OTHER): Payer: PPO | Admitting: Physician Assistant

## 2016-06-29 ENCOUNTER — Encounter: Payer: Self-pay | Admitting: Physician Assistant

## 2016-06-29 VITALS — BP 124/72 | HR 55 | Ht 71.0 in | Wt 201.0 lb

## 2016-06-29 DIAGNOSIS — I495 Sick sinus syndrome: Secondary | ICD-10-CM | POA: Diagnosis not present

## 2016-06-29 DIAGNOSIS — I480 Paroxysmal atrial fibrillation: Secondary | ICD-10-CM

## 2016-06-29 DIAGNOSIS — I2510 Atherosclerotic heart disease of native coronary artery without angina pectoris: Secondary | ICD-10-CM

## 2016-06-29 NOTE — Patient Instructions (Addendum)
Medication Instructions:   Your physician recommends that you continue on your current medications as directed. Please refer to the Current Medication list given to you today.   If you need a refill on your cardiac medications before your next appointment, please call your pharmacy.  Labwork: NONE ORDER TODAY    Testing/Procedures: NONE ORDER TODAY    Follow-Up:  Your physician wants you to follow-up in: [place].Russell Griffith You will receive a reminder letter in the mail two months in advance. If you don't receive a letter, please call our office to schedule the follow-up appointment.  Remote monitoring is used to monitor your Pacemaker of ICD from home. This monitoring reduces the number of office visits required to check your device to one time per year. It allows [place] to keep an eye on the functioning of your device to ensure it is working properly. You are scheduled for a device check from home on .  10/31/2017You may send your transmission at any time that day. If you have a wireless device, the transmission will be sent automatically. After your physician reviews your transmission, you will receive a postcard with your next transmission date.    Any Other Special Instructions Will Be Listed Below (If Applicable).

## 2016-07-01 ENCOUNTER — Encounter: Payer: Self-pay | Admitting: Internal Medicine

## 2016-07-31 ENCOUNTER — Encounter: Payer: PPO | Admitting: Internal Medicine

## 2016-08-06 ENCOUNTER — Encounter: Payer: Self-pay | Admitting: Internal Medicine

## 2016-08-06 ENCOUNTER — Other Ambulatory Visit (INDEPENDENT_AMBULATORY_CARE_PROVIDER_SITE_OTHER): Payer: PPO

## 2016-08-06 ENCOUNTER — Ambulatory Visit (INDEPENDENT_AMBULATORY_CARE_PROVIDER_SITE_OTHER): Payer: PPO | Admitting: Internal Medicine

## 2016-08-06 VITALS — BP 130/80 | HR 56 | Ht 71.0 in | Wt 200.0 lb

## 2016-08-06 DIAGNOSIS — M25561 Pain in right knee: Secondary | ICD-10-CM

## 2016-08-06 DIAGNOSIS — E079 Disorder of thyroid, unspecified: Secondary | ICD-10-CM | POA: Diagnosis not present

## 2016-08-06 DIAGNOSIS — Z23 Encounter for immunization: Secondary | ICD-10-CM | POA: Diagnosis not present

## 2016-08-06 DIAGNOSIS — Z0000 Encounter for general adult medical examination without abnormal findings: Secondary | ICD-10-CM

## 2016-08-06 DIAGNOSIS — I495 Sick sinus syndrome: Secondary | ICD-10-CM

## 2016-08-06 DIAGNOSIS — M25562 Pain in left knee: Secondary | ICD-10-CM

## 2016-08-06 DIAGNOSIS — I10 Essential (primary) hypertension: Secondary | ICD-10-CM | POA: Diagnosis not present

## 2016-08-06 DIAGNOSIS — K22710 Barrett's esophagus with low grade dysplasia: Secondary | ICD-10-CM

## 2016-08-06 DIAGNOSIS — I480 Paroxysmal atrial fibrillation: Secondary | ICD-10-CM

## 2016-08-06 DIAGNOSIS — D126 Benign neoplasm of colon, unspecified: Secondary | ICD-10-CM

## 2016-08-06 DIAGNOSIS — I25810 Atherosclerosis of coronary artery bypass graft(s) without angina pectoris: Secondary | ICD-10-CM

## 2016-08-06 LAB — CBC WITH DIFFERENTIAL/PLATELET
BASOS PCT: 0.4 % (ref 0.0–3.0)
Basophils Absolute: 0 10*3/uL (ref 0.0–0.1)
EOS ABS: 0.1 10*3/uL (ref 0.0–0.7)
Eosinophils Relative: 1.3 % (ref 0.0–5.0)
HEMATOCRIT: 44.5 % (ref 39.0–52.0)
Hemoglobin: 15.1 g/dL (ref 13.0–17.0)
LYMPHS PCT: 23.5 % (ref 12.0–46.0)
Lymphs Abs: 1.5 10*3/uL (ref 0.7–4.0)
MCHC: 34 g/dL (ref 30.0–36.0)
MCV: 86 fl (ref 78.0–100.0)
Monocytes Absolute: 1 10*3/uL (ref 0.1–1.0)
Monocytes Relative: 15.8 % — ABNORMAL HIGH (ref 3.0–12.0)
NEUTROS ABS: 3.7 10*3/uL (ref 1.4–7.7)
Neutrophils Relative %: 59 % (ref 43.0–77.0)
PLATELETS: 165 10*3/uL (ref 150.0–400.0)
RBC: 5.17 Mil/uL (ref 4.22–5.81)
RDW: 14.4 % (ref 11.5–15.5)
WBC: 6.2 10*3/uL (ref 4.0–10.5)

## 2016-08-06 LAB — PSA: PSA: 1.46 ng/mL (ref 0.10–4.00)

## 2016-08-06 LAB — BASIC METABOLIC PANEL
BUN: 21 mg/dL (ref 6–23)
CALCIUM: 9.4 mg/dL (ref 8.4–10.5)
CO2: 32 mEq/L (ref 19–32)
CREATININE: 1.07 mg/dL (ref 0.40–1.50)
Chloride: 105 mEq/L (ref 96–112)
GFR: 72.93 mL/min (ref >60.00)
Glucose, Bld: 110 mg/dL — ABNORMAL HIGH (ref 70–99)
POTASSIUM: 4.5 meq/L (ref 3.5–5.1)
Sodium: 141 mEq/L (ref 135–145)

## 2016-08-06 LAB — URINALYSIS
Bilirubin Urine: NEGATIVE
Hgb urine dipstick: NEGATIVE
Ketones, ur: NEGATIVE
Leukocytes, UA: NEGATIVE
Nitrite: NEGATIVE
PH: 6.5 (ref 5.0–8.0)
SPECIFIC GRAVITY, URINE: 1.015 (ref 1.000–1.030)
TOTAL PROTEIN, URINE-UPE24: NEGATIVE
URINE GLUCOSE: NEGATIVE
Urobilinogen, UA: 1 (ref 0.0–1.0)

## 2016-08-06 LAB — T4, FREE: FREE T4: 0.98 ng/dL (ref 0.60–1.60)

## 2016-08-06 LAB — LIPID PANEL
CHOLESTEROL: 118 mg/dL (ref 0–200)
HDL: 33.7 mg/dL — ABNORMAL LOW (ref >39.00)
LDL Cholesterol: 73 mg/dL (ref 0–99)
NonHDL: 84.15
Total CHOL/HDL Ratio: 3
Triglycerides: 57 mg/dL (ref 0.0–149.0)
VLDL: 11.4 mg/dL (ref 0.0–40.0)

## 2016-08-06 LAB — HEPATIC FUNCTION PANEL
ALBUMIN: 4.4 g/dL (ref 3.5–5.2)
ALT: 28 U/L (ref 0–53)
AST: 24 U/L (ref 0–37)
Alkaline Phosphatase: 79 U/L (ref 39–117)
Bilirubin, Direct: 0.5 mg/dL — ABNORMAL HIGH (ref 0.0–0.3)
Total Bilirubin: 2.7 mg/dL — ABNORMAL HIGH (ref 0.2–1.2)
Total Protein: 6.7 g/dL (ref 6.0–8.3)

## 2016-08-06 LAB — TSH: TSH: 4.85 u[IU]/mL — ABNORMAL HIGH (ref 0.35–4.50)

## 2016-08-06 NOTE — Assessment & Plan Note (Signed)
Altace 

## 2016-08-06 NOTE — Progress Notes (Signed)
Subjective:  Patient ID: Russell Griffith ED Mauch, male    DOB: 1946-11-22  Age: 70 y.o. MRN: [id number]  CC: Annual Exam   HPI Raghav ED Ivanoff presents for a well exam C/o R knee pain and stiffness (Dr Oneida Alar). C/o L 1st MCP F/u GERD  Outpatient Medications Prior to Visit  Medication Sig Dispense Refill  . apixaban (ELIQUIS) 5 MG TABS tablet Take 1 tablet (5 mg total) by mouth 2 (two) times daily. 60 tablet 11  . atorvastatin (LIPITOR) 20 MG tablet TAKE 1 TABLET ONCE DAILY. 30 tablet 6  . pantoprazole (PROTONIX) 40 MG tablet Take 1 tablet (40 mg total) by mouth daily. 90 tablet 3  . ramipril (ALTACE) 2.5 MG capsule TAKE (1) CAPSULE DAILY. 30 capsule 11   No facility-administered medications prior to visit.     ROS Review of Systems  Constitutional: Negative for appetite change, fatigue and unexpected weight change.  HENT: Negative for congestion, nosebleeds, sneezing, sore throat and trouble swallowing.   Eyes: Negative for itching and visual disturbance.  Respiratory: Negative for cough.   Cardiovascular: Negative for chest pain, palpitations and leg swelling.  Gastrointestinal: Negative for abdominal distention, blood in stool, diarrhea and nausea.  Genitourinary: Negative for frequency and hematuria.  Musculoskeletal: Positive for arthralgias and gait problem. Negative for back pain, joint swelling and neck pain.  Skin: Negative for rash.  Neurological: Negative for dizziness, tremors, speech difficulty and weakness.  Psychiatric/Behavioral: Negative for agitation, dysphoric mood and sleep disturbance. The patient is not nervous/anxious.     Objective:  BP 130/80   Pulse (!) 56   Ht 5\' 11"  (1.803 m)   Wt 200 lb (90.7 kg)   SpO2 96%   BMI 27.89 kg/m   BP Readings from Last 3 Encounters:  08/06/16 130/80  06/29/16 124/72  12/12/15 140/70    Wt Readings from Last 3 Encounters:  08/06/16 200 lb (90.7 kg)  06/29/16 201 lb (91.2 kg)  12/12/15 199 lb 4.8 oz (90.4 kg)     Physical Exam  Constitutional: He is oriented to person, place, and time. He appears well-developed and well-nourished. No distress.  HENT:  Head: Normocephalic and atraumatic.  Right Ear: External ear normal.  Left Ear: External ear normal.  Nose: Nose normal.  Mouth/Throat: Oropharynx is clear and moist. No oropharyngeal exudate.  Eyes: Conjunctivae and EOM are normal. Pupils are equal, round, and reactive to light. Right eye exhibits no discharge. Left eye exhibits no discharge. No scleral icterus.  Neck: Normal range of motion. Neck supple. No JVD present. No tracheal deviation present. No thyromegaly present.  Cardiovascular: Normal rate, regular rhythm, normal heart sounds and intact distal pulses.  Exam reveals no gallop and no friction rub.   No murmur heard. Pulmonary/Chest: Effort normal and breath sounds normal. No stridor. No respiratory distress. He has no wheezes. He has no rales. He exhibits no tenderness.  Abdominal: Soft. Bowel sounds are normal. He exhibits no distension and no mass. There is no tenderness. There is no rebound and no guarding.  Genitourinary: Rectum normal, prostate normal and penis normal. Rectal exam shows guaiac negative stool. No penile tenderness.  Musculoskeletal: Normal range of motion. He exhibits no edema or tenderness.  Lymphadenopathy:    He has no cervical adenopathy.  Neurological: He is alert and oriented to person, place, and time. He has normal reflexes. No cranial nerve deficit. He exhibits normal muscle tone. Coordination normal.  Skin: Skin is warm and dry. No rash noted. He is  not diaphoretic. No erythema. No pallor.  Psychiatric: He has a normal mood and affect. His behavior is normal. Judgment and thought content normal.  knees NT B L thenar sensitive to palpation  Lab Results  Component Value Date   WBC 6.4 10/29/2015   HGB 14.8 10/29/2015   HCT 44.7 10/29/2015   PLT 177.0 10/29/2015   GLUCOSE 107 (H) 10/29/2015   CHOL 114  07/30/2015   TRIG 73.0 07/30/2015   HDL 28.90 (L) 07/30/2015   LDLCALC 70 07/30/2015   ALT 24 10/29/2015   AST 24 10/29/2015   NA 142 10/29/2015   K 4.5 10/29/2015   CL 106 10/29/2015   CREATININE 1.12 10/29/2015   BUN 20 10/29/2015   CO2 29 10/29/2015   TSH 6.20 (H) 07/30/2015   PSA 0.94 07/30/2015   INR 1.53 (H) 07/15/2012   HGBA1C 5.8 07/30/2015    No results found.  Assessment & Plan:   There are no diagnoses linked to this encounter. I am having Mr. Sabetta maintain his apixaban, atorvastatin, ramipril, pantoprazole, and Cholecalciferol.  Meds ordered this encounter  Medications  . Cholecalciferol 1000 units tablet    Sig: Take 1,000 Units by mouth daily.     Follow-up: No Follow-up on file.  Walker Kehr, MD

## 2016-08-06 NOTE — Assessment & Plan Note (Signed)
Colon q 5 years - Dr Ardis Hughs

## 2016-08-06 NOTE — Assessment & Plan Note (Signed)
On Eliquis

## 2016-08-06 NOTE — Assessment & Plan Note (Signed)
Pacemaker 2013

## 2016-08-06 NOTE — Assessment & Plan Note (Signed)
Worse R knee pain  He will see Dr Oneida Alar Turmeric po prn if tolerated

## 2016-08-06 NOTE — Patient Instructions (Signed)

## 2016-08-06 NOTE — Assessment & Plan Note (Addendum)
Dr Ardis Hughs. On Protonix  Potential benefits of a long term PPI use as well as potential risks  and complications were explained to the patient and were aknowledged. It is worse sometimes after eating out. Use Pepcid AC prn when needed

## 2016-08-06 NOTE — Progress Notes (Signed)
Pre visit review using our clinic review tool, if applicable. No additional management support is needed unless otherwise documented below in the visit note. 

## 2016-08-07 LAB — HEPATITIS C ANTIBODY: HCV AB: NEGATIVE

## 2016-08-13 DIAGNOSIS — L578 Other skin changes due to chronic exposure to nonionizing radiation: Secondary | ICD-10-CM | POA: Diagnosis not present

## 2016-08-13 DIAGNOSIS — L570 Actinic keratosis: Secondary | ICD-10-CM | POA: Diagnosis not present

## 2016-08-13 DIAGNOSIS — Z85828 Personal history of other malignant neoplasm of skin: Secondary | ICD-10-CM | POA: Diagnosis not present

## 2016-08-13 DIAGNOSIS — L821 Other seborrheic keratosis: Secondary | ICD-10-CM | POA: Diagnosis not present

## 2016-08-13 DIAGNOSIS — D1801 Hemangioma of skin and subcutaneous tissue: Secondary | ICD-10-CM | POA: Diagnosis not present

## 2016-09-28 ENCOUNTER — Other Ambulatory Visit: Payer: Self-pay | Admitting: Cardiology

## 2016-09-28 NOTE — Telephone Encounter (Signed)
Rx has been sent to the pharmacy electronically. ° °

## 2016-09-29 ENCOUNTER — Ambulatory Visit (INDEPENDENT_AMBULATORY_CARE_PROVIDER_SITE_OTHER): Payer: PPO | Admitting: *Deleted

## 2016-09-29 ENCOUNTER — Telehealth: Payer: Self-pay | Admitting: Cardiology

## 2016-09-29 DIAGNOSIS — I495 Sick sinus syndrome: Secondary | ICD-10-CM | POA: Diagnosis not present

## 2016-09-29 NOTE — Telephone Encounter (Signed)
LMOVM reminding pt to send remote transmission.   

## 2016-09-30 NOTE — Progress Notes (Signed)
Remote pacemaker transmission.   

## 2016-10-07 ENCOUNTER — Encounter: Payer: Self-pay | Admitting: Cardiology

## 2016-10-11 ENCOUNTER — Other Ambulatory Visit: Payer: Self-pay | Admitting: Internal Medicine

## 2016-10-11 DIAGNOSIS — I4892 Unspecified atrial flutter: Secondary | ICD-10-CM

## 2016-10-29 LAB — CUP PACEART REMOTE DEVICE CHECK
Battery Remaining Percentage: 100 %
Date Time Interrogation Session: 20171031202300
Implantable Lead Implant Date: 20130816
Implantable Lead Location: 753860
Implantable Pulse Generator Implant Date: 20130816
Lead Channel Impedance Value: 530 Ohm
MDC IDC LEAD IMPLANT DT: 20130816
MDC IDC LEAD LOCATION: 753859
MDC IDC MSMT BATTERY REMAINING LONGEVITY: 120 mo
MDC IDC MSMT LEADCHNL RA IMPEDANCE VALUE: 593 Ohm
MDC IDC MSMT LEADCHNL RA PACING THRESHOLD AMPLITUDE: 0.7 V
MDC IDC MSMT LEADCHNL RA PACING THRESHOLD PULSEWIDTH: 0.4 ms
MDC IDC SET LEADCHNL RA PACING AMPLITUDE: 2 V
MDC IDC SET LEADCHNL RV PACING AMPLITUDE: 2.4 V
MDC IDC SET LEADCHNL RV PACING PULSEWIDTH: 0.7 ms
MDC IDC SET LEADCHNL RV SENSING SENSITIVITY: 2.5 mV
MDC IDC STAT BRADY RA PERCENT PACED: 97 %
MDC IDC STAT BRADY RV PERCENT PACED: 9 %
Pulse Gen Serial Number: 111255

## 2016-11-11 ENCOUNTER — Encounter: Payer: Self-pay | Admitting: Sports Medicine

## 2016-11-11 ENCOUNTER — Ambulatory Visit: Payer: Self-pay

## 2016-11-11 ENCOUNTER — Ambulatory Visit (INDEPENDENT_AMBULATORY_CARE_PROVIDER_SITE_OTHER): Payer: PPO | Admitting: Sports Medicine

## 2016-11-11 VITALS — BP 166/63 | Ht 71.0 in | Wt 192.0 lb

## 2016-11-11 DIAGNOSIS — M25512 Pain in left shoulder: Secondary | ICD-10-CM | POA: Diagnosis not present

## 2016-11-11 DIAGNOSIS — M79645 Pain in left finger(s): Secondary | ICD-10-CM | POA: Diagnosis not present

## 2016-11-11 MED ORDER — NITROGLYCERIN 0.2 MG/HR TD PT24: MEDICATED_PATCH | TRANSDERMAL | 1 refills | Status: DC

## 2016-11-11 MED ORDER — DICLOFENAC SODIUM 1 % TD GEL: TRANSDERMAL | 1 refills | Status: DC

## 2016-11-11 NOTE — Patient Instructions (Signed)

## 2016-11-11 NOTE — Progress Notes (Signed)
Felton ED Prickett - 70 y.o. male MRN [id number]  Date of birth: Apr 08, 1946  SUBJECTIVE:  Including CC & ROS.   [name] is a 70 year old male that is presenting with left shoulder pain and left thumb pain. He reports left shoulder pain has been occurring for the past 6 months. It is intermittent in nature and seems to be worse at night. He denies any inciting event. He has a history of right shoulder surgery but this feels different. This pain that he is having in his left shoulder is not as bad. He reports it as an ache. He has pain with certain movements and abduction. He denies any injury, surgery, or inciting event. He has taken Tylenol to help with the pain.  He is also having pain in his left thumb on the radial aspect. He has pain with gripping or trying to open a door. This pain is intermittent. It is localized to his thumb. He denies any injury or inciting event. This started about 3-4 months ago.  ROS: No unexpected weight loss, fever, chills, swelling, instability, numbness/tingling, redness, otherwise see HPI    HISTORY: Past Medical, Surgical, Social, and Family History Reviewed & Updated per EMR.   Pertinent Historical Findings include: PMSHx -  status post pacemaker placement for bradycardia, quintuple CABG, right shoulder arthroscopy PSHx -  no tobacco use, occasional alcohol use FHx -  heart disease, arthritis, multiple myeloma Medications - Eliquis   DATA REVIEWED: None to review   PHYSICAL EXAM:  VS: BP:(!) 166/63  HR: bpm  TEMP: ( )  RESP:   HT:_0  (180.3 cm)   WT:192 lb (87.1 kg)  BMI:26.8 PHYSICAL EXAM: Gen: NAD, alert, cooperative with exam, well-appearing HEENT: clear conjunctiva, EOMI CV:  no edema, capillary refill brisk,  Resp: non-labored, normal speech Skin: no rashes, normal turgor  Neuro: no gross deficits.  Psych:  alert and oriented Left Shoulder: Inspection reveals no abnormalities, atrophy or asymmetry. Palpation is normal with no  tenderness over AC joint or bicipital groove. ROM is full in all planes. Rotator cuff strength normal throughout. Some pain with negative Neer and Hawkin's tests, empty can sign. No painful arc and no drop arm sign. No apprehension sign Left Wrist:  Hypertrophy of the first MCP joint. Tenderness to palpation over the [facility] joint No tenderness to palpation over the distal radius. No tenderness palpation over the snuffbox No signs of atrophy of the thenar or hypothenar eminence Normal wrist range of motion in flexion, extension, ulnar and radial deviation. No finger misalignment or malrotation Normal thumb opposition. Normal finger abduction and abduction. Normal grip strength. Negative Finkelstein's test. Neurovascularly intact   Limited ultrasound: Left shoulder: The biceps tendon was viewed in long and short axis and had a hypoechoic ring surrounding the tendon. The tendon was intact. The subscapularis was viewed and found to be normal. There is no signs of impingement with dynamic testing of the subscapularis. The supraspinatus had a slight articular sided small tear on the anterior aspect of the tendon. There is no suggestion of impingement with dynamic testing. The infraspinatus and teres minor were found to be normal. The acromioclavicular joint had mild degenerative changes.  Findings consistent with a small articular sided supraspinatus tear.  Limited ultrasound: Left wrist: The MCP joint was found to be normal. The [facility] joint was found to have degenerative changes and hypoechoic changes consistent with effusion of the joint.  Findings consistent with CMC arthritis.  ASSESSMENT & PLAN:   Pain of left  thumb His exam was not consistent with de Quervain's tenosynovitis but appears to be associated with his CMC joint. - We will try Voltaren gel. He is not able to take oral anti-inflammatories due to his cardiac history - If no improvement may need to try an injection of this  joint.  Left shoulder pain Findings are consistent with a partial articular sided rotator cuff tear. He has good strength on exam though. - Initiate nitroglycerin protocol - He will follow-up in 6 weeks to rescan this area. - Could consider a subacromial injection if he is having pain.

## 2016-11-12 DIAGNOSIS — M79645 Pain in left finger(s): Secondary | ICD-10-CM | POA: Insufficient documentation

## 2016-11-12 DIAGNOSIS — M25512 Pain in left shoulder: Secondary | ICD-10-CM | POA: Insufficient documentation

## 2016-11-12 NOTE — Assessment & Plan Note (Signed)
Findings are consistent with a partial articular sided rotator cuff tear. He has good strength on exam though. - Initiate nitroglycerin protocol - He will follow-up in 6 weeks to rescan this area. - Could consider a subacromial injection if he is having pain.

## 2016-11-12 NOTE — Assessment & Plan Note (Signed)
His exam was not consistent with de Quervain's tenosynovitis but appears to be associated with his CMC joint. - We will try Voltaren gel. He is not able to take oral anti-inflammatories due to his cardiac history - If no improvement may need to try an injection of this joint.

## 2016-12-17 ENCOUNTER — Ambulatory Visit: Payer: PPO | Admitting: Cardiology

## 2016-12-23 ENCOUNTER — Ambulatory Visit: Payer: PPO | Admitting: Sports Medicine

## 2016-12-24 ENCOUNTER — Ambulatory Visit: Payer: PPO | Admitting: Sports Medicine

## 2016-12-29 ENCOUNTER — Telehealth: Payer: Self-pay | Admitting: Cardiology

## 2016-12-29 ENCOUNTER — Ambulatory Visit (INDEPENDENT_AMBULATORY_CARE_PROVIDER_SITE_OTHER): Payer: PPO | Admitting: *Deleted

## 2016-12-29 DIAGNOSIS — I495 Sick sinus syndrome: Secondary | ICD-10-CM | POA: Diagnosis not present

## 2016-12-29 NOTE — Telephone Encounter (Signed)
Confirmed remote transmission w/ pt wife.   

## 2016-12-30 ENCOUNTER — Encounter: Payer: Self-pay | Admitting: Cardiology

## 2016-12-30 NOTE — Progress Notes (Signed)
Remote pacemaker transmission.   

## 2017-01-05 NOTE — Progress Notes (Signed)
Cardiology Office Note   Date:  01/07/2017   ID:  Russell Griffith, DOB Aug 04, 1946, MRN [id number]  PCP:  Russell Kehr, MD  Cardiologist:   Russell Breeding, MD   Chief Complaint  Patient presents with  . Coronary Artery Disease      History of Present Illness: Russell Griffith is a 71 y.o. male who presents for evaluation of known CAD.   He is well known to Dr. Ron Griffith and Dr. Caryl Griffith.  I saw him for the first time in 2016.[place] Russell Griffith   He has paroxysmal atrial fibrillation and has had a flutter ablation.  He has a pacemaker.  At the last visit he was describing some decreased exercise tolerance or dyspnea with exertion. He had a negative POET (Plain Old Exercise Treadmill) in Jan of last year.  Since I last saw him he has done well.  He does the elliptical routinely.  The patient denies any new symptoms such as chest discomfort, neck or arm discomfort. There has been no new shortness of breath, PND or orthopnea. There have been no reported palpitations, presyncope or syncope.   Past Medical History:  Diagnosis Date  . A-fib ([place])   . Aortic stenosis    Mild, echo, April, 2014  . Atrial flutter ([place])    Status post ablation  . BPH (benign prostatic hyperplasia)   . CAD (coronary artery disease)    Myoview March 2014  . Carotid artery disease ([place])   . Chronic foot pain 2009   Resolved with use of orthotics  . Colonic polyp   . Diverticulosis   . Elevated bilirubin    Mild chronic elevation, 2.0 January, 2011 stable  . GERD (gastroesophageal reflux disease)    Barrett's esophagus  . Hemorrhoids    September, 2012  . HTN (hypertension)   . HX: anticoagulation   . Hyperlipidemia    Low HDL  . 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 Primary osteoarthritis of left knee    Mild  . Prolapsed internal hemorrhoids, grade 3 08/12/2015  . PVC's (premature ventricular contractions)   . Tubular adenoma of colon     Past Surgical History:  Procedure Laterality Date  .  COLONOSCOPY    . CORONARY ARTERY BYPASS GRAFT  2000   CABG X5  . ESOPHAGOGASTRODUODENOSCOPY    . HEMORRHOID BANDING    . INSERT / REPLACE / REMOVE PACEMAKER  07/15/2012   initial placement  . PERMANENT PACEMAKER INSERTION N/A 07/15/2012   Procedure: PERMANENT PACEMAKER INSERTION;  Surgeon: Deboraha Sprang, MD;  Location: [facility] CATH LAB;  Service: Cardiovascular;  Laterality: N/A;  . radiofrequency  ablation for heart arrythmia    . TONSILLECTOMY  1956     Current Outpatient Prescriptions  Medication Sig Dispense Refill  . atorvastatin (LIPITOR) 20 MG tablet TAKE 1 TABLET ONCE DAILY. 30 tablet 3  . Cholecalciferol 1000 units tablet Take 1,000 Units by mouth daily.    . diclofenac sodium (VOLTAREN) 1 % GEL Use TID-QID to the left thumb 100 g 1  . ELIQUIS 5 MG TABS tablet TAKE 1 TABLET TWICE DAILY. 60 tablet 3  . ketoconazole (NIZORAL) 2 % cream     . nitroGLYCERIN (NITRODUR - DOSED IN MG/24 HR) 0.2 mg/hr patch Use as directed    . pantoprazole (PROTONIX) 40 MG tablet Take 1 tablet (40 mg total) by mouth daily. 90 tablet 3  . ramipril (ALTACE) 2.5 MG capsule TAKE (1)  CAPSULE DAILY. 30 capsule 11   No current facility-administered medications for this visit.     Allergies:   Cayenne; Niacin and related; and Sulfonamide derivatives    ROS:  Please see the history of present illness.   Otherwise, review of systems are positive for knee pain.   All other systems are reviewed and negative.    PHYSICAL EXAM: VS:  BP 110/62   Pulse (!) 55   Ht 5\' 11"  (1.803 m)   Wt 200 lb (90.7 kg)   BMI 27.89 kg/m  , BMI Body mass index is 27.89 kg/m. GENERAL:  Well appearing NECK:  No jugular venous distention, waveform within normal limits, carotid upstroke brisk and symmetric, no bruits, no thyromegaly LUNGS:  Clear to auscultation bilaterally CHEST:  Well healed sternotomy scar. HEART:  PMI not displaced or sustained,S1 and S2 within normal limits, no S3, no S4, no clicks, no rubs,  soft early  systolic murmur nonradiating, no diastolic murmurs ABD:  Flat, positive bowel sounds normal in frequency in pitch, no bruits, no rebound, no guarding, no midline pulsatile mass, no hepatomegaly, no splenomegaly EXT:  2 plus pulses throughout, no edema, no cyanosis no clubbing   EKG:  EKG is ordered today. Atrial paced rhythm, rate 55, axis within normal limits, first-degree AV block, no acute ST-T wave changes.   Recent Labs: 08/06/2016: ALT 28; BUN 21; Creatinine, Ser 1.07; Hemoglobin 15.1; Platelets 165.0; Potassium 4.5; Sodium 141; TSH 4.85    Lipid Panel    Component Value Date/Time   CHOL 118 08/06/2016 0925   TRIG 57.0 08/06/2016 0925   HDL 33.70 (L) 08/06/2016 0925   CHOLHDL 3 08/06/2016 0925   VLDL 11.4 08/06/2016 0925   LDLCALC 73 08/06/2016 0925      Wt Readings from Last 3 Encounters:  01/07/17 200 lb (90.7 kg)  11/11/16 192 lb (87.1 kg)  08/06/16 200 lb (90.7 kg)      Other studies Reviewed: Additional studies/ records that were reviewed today include:  None Review of the above records demonstrates:     ASSESSMENT AND PLAN:  ATRIAL FIB:   CHADS VASC score is 3.   He tolerated anticoagulation.  No change in therapy is planned.   PACEMAKER PLACEMENT:   He is up-to-date with follow-up.    CAD:   He had a negative POET (Plain Old Exercise Treadmill) in Jan of last year.  He will continue the meds as listed.  I will repeat a POET (Plain Old Exercise Treadmill) in Jan of next year for screening.   CAROTID STENOSIS:   He has mild stenosis and follow up in June of this year.    DYSPNEA:  BNP was normal as was the stress test above.symptoms are slightly improved. No change in therapy is indicated.   ABNORMAL ECHO:   I reviewed this and no further imaging is planned at this time.   Current medicines are reviewed at length with the patient today.  The patient does not have concerns regarding medicines.  The following changes have been made:  None  Labs/ tests  ordered today include:  None  Orders Placed This Encounter  Procedures  . EXERCISE TOLERANCE TEST  . EKG 12-Lead     Disposition:   FU with me in 12 months.    Signed, Russell Breeding, MD  01/07/2017 12:50 PM    [facility]

## 2017-01-07 ENCOUNTER — Encounter: Payer: Self-pay | Admitting: Cardiology

## 2017-01-07 ENCOUNTER — Ambulatory Visit (INDEPENDENT_AMBULATORY_CARE_PROVIDER_SITE_OTHER): Payer: PPO | Admitting: Cardiology

## 2017-01-07 VITALS — BP 110/62 | HR 55 | Ht 71.0 in | Wt 200.0 lb

## 2017-01-07 DIAGNOSIS — I779 Disorder of arteries and arterioles, unspecified: Secondary | ICD-10-CM

## 2017-01-07 DIAGNOSIS — I739 Peripheral vascular disease, unspecified: Secondary | ICD-10-CM

## 2017-01-07 DIAGNOSIS — I25810 Atherosclerosis of coronary artery bypass graft(s) without angina pectoris: Secondary | ICD-10-CM

## 2017-01-07 NOTE — Patient Instructions (Signed)
Medication Instructions:  Continue current medications  Labwork: None Ordered  Testing/Procedures: Your physician has requested that you have an exercise tolerance test. For further information please visit [email or web address]. Please also follow instruction sheet, as given.   Follow-Up: Your physician wants you to follow-up in: 1 Year. You will receive a reminder letter in the mail two months in advance. If you don't receive a letter, please call our office to schedule the follow-up appointment.   Any Other Special Instructions Will Be Listed Below (If Applicable).   If you need a refill on your cardiac medications before your next appointment, please call your pharmacy.   

## 2017-01-11 LAB — CUP PACEART REMOTE DEVICE CHECK
Battery Remaining Percentage: 100 %
Date Time Interrogation Session: 20180131041300
Implantable Lead Implant Date: 20130816
Implantable Lead Implant Date: 20130816
Implantable Lead Location: 753860
Implantable Lead Model: 5076
Implantable Pulse Generator Implant Date: 20130816
Lead Channel Impedance Value: 534 Ohm
Lead Channel Impedance Value: 590 Ohm
Lead Channel Setting Sensing Sensitivity: 2.5 mV
MDC IDC LEAD LOCATION: 753859
MDC IDC MSMT BATTERY REMAINING LONGEVITY: 114 mo
MDC IDC MSMT LEADCHNL RA PACING THRESHOLD AMPLITUDE: 0.7 V
MDC IDC MSMT LEADCHNL RA PACING THRESHOLD PULSEWIDTH: 0.4 ms
MDC IDC SET LEADCHNL RA PACING AMPLITUDE: 2 V
MDC IDC SET LEADCHNL RV PACING AMPLITUDE: 2.4 V
MDC IDC SET LEADCHNL RV PACING PULSEWIDTH: 0.7 ms
MDC IDC STAT BRADY RA PERCENT PACED: 97 %
MDC IDC STAT BRADY RV PERCENT PACED: 7 %
Pulse Gen Serial Number: 111255

## 2017-01-25 ENCOUNTER — Other Ambulatory Visit: Payer: Self-pay | Admitting: Cardiology

## 2017-02-14 ENCOUNTER — Other Ambulatory Visit: Payer: Self-pay | Admitting: Internal Medicine

## 2017-02-14 DIAGNOSIS — I4892 Unspecified atrial flutter: Secondary | ICD-10-CM

## 2017-03-07 ENCOUNTER — Other Ambulatory Visit: Payer: Self-pay | Admitting: Cardiology

## 2017-03-16 ENCOUNTER — Encounter: Payer: Self-pay | Admitting: Physician Assistant

## 2017-03-22 ENCOUNTER — Other Ambulatory Visit: Payer: Self-pay | Admitting: Gastroenterology

## 2017-03-22 DIAGNOSIS — H2513 Age-related nuclear cataract, bilateral: Secondary | ICD-10-CM | POA: Diagnosis not present

## 2017-04-06 ENCOUNTER — Ambulatory Visit (INDEPENDENT_AMBULATORY_CARE_PROVIDER_SITE_OTHER): Payer: PPO | Admitting: *Deleted

## 2017-04-06 DIAGNOSIS — I495 Sick sinus syndrome: Secondary | ICD-10-CM | POA: Diagnosis not present

## 2017-04-06 NOTE — Progress Notes (Signed)
Remote pacemaker transmission.   

## 2017-04-08 LAB — CUP PACEART REMOTE DEVICE CHECK
Implantable Lead Implant Date: 20130816
Implantable Lead Location: 753859
Implantable Pulse Generator Implant Date: 20130816
Lead Channel Impedance Value: 512 Ohm
Lead Channel Impedance Value: 554 Ohm
Lead Channel Pacing Threshold Pulse Width: 0.4 ms
Lead Channel Setting Pacing Amplitude: 2.4 V
MDC IDC LEAD IMPLANT DT: 20130816
MDC IDC LEAD LOCATION: 753860
MDC IDC MSMT BATTERY REMAINING LONGEVITY: 108 mo
MDC IDC MSMT BATTERY REMAINING PERCENTAGE: 100 %
MDC IDC MSMT LEADCHNL RA PACING THRESHOLD AMPLITUDE: 0.7 V
MDC IDC SESS DTM: 20180508053100
MDC IDC SET LEADCHNL RA PACING AMPLITUDE: 2 V
MDC IDC SET LEADCHNL RV PACING PULSEWIDTH: 0.7 ms
MDC IDC SET LEADCHNL RV SENSING SENSITIVITY: 2.5 mV
MDC IDC STAT BRADY RA PERCENT PACED: 97 %
MDC IDC STAT BRADY RV PERCENT PACED: 7 %
Pulse Gen Serial Number: 111255

## 2017-04-11 ENCOUNTER — Other Ambulatory Visit: Payer: Self-pay | Admitting: Cardiology

## 2017-04-12 NOTE — Telephone Encounter (Signed)
Rx(s) sent to pharmacy electronically.  

## 2017-04-20 ENCOUNTER — Ambulatory Visit: Payer: Self-pay

## 2017-04-20 ENCOUNTER — Ambulatory Visit (INDEPENDENT_AMBULATORY_CARE_PROVIDER_SITE_OTHER): Payer: PPO | Admitting: Sports Medicine

## 2017-04-20 VITALS — BP 136/55 | Ht 71.0 in | Wt 190.0 lb

## 2017-04-20 DIAGNOSIS — M25561 Pain in right knee: Secondary | ICD-10-CM

## 2017-04-20 DIAGNOSIS — M25562 Pain in left knee: Secondary | ICD-10-CM

## 2017-04-20 NOTE — Progress Notes (Signed)
Subjective: CC: R knee pain HPI: Patient is a 71 y.o. male with a past medical history of A fib, CAD, HTN, chronic gastritis  presenting to clinic today for R knee pain.  Patient states that he was working out on the elliptical and doing sit ups last Wednesday. On Thursday, the patient noted significant pain on the lateral aspect of his R knee.  He states the pain was a 5 out of 10 at that time and radiated down the lateral calf.  He stopped all exercise since that time and notes that the lateral knee pain has improved and resolved. He does still note some soreness of the upper lateral calf to palpation. On Monday, he noted some pain on waking on the medial aspect of the right knee which lasted 12-13 hours and resolve spontaneously. Review of system, he denies any swelling, locking sensation, instability. He does note intermittent popping of his knees bilaterally.  He states that he consistently wears Reed Breech knee sleeves bilaterally with exercise.  He is tried heat, ice, and elevation with no improvement in his symptoms. Rest seem to help, but he is concerned as he's never had pain with this before. In the past he's had mild knee pain that resolved with injections. He exercises regularly.   Social History: Never smoker   ROS: All other systems reviewed and are negative.  No locking No giving way No noticeable swelling  Past Medical History Patient Active Problem List   Diagnosis Date Noted  . Left shoulder pain 11/12/2016  . Pain of left thumb 11/12/2016  . Abdominal pain, epigastric 10/29/2015  . Prolapsed internal hemorrhoids, grade 3 08/12/2015  . IT band syndrome 07/23/2015  . Partial tear of rotator cuff 12/04/2014  . Well adult exam 07/03/2014  . Jock itch 07/03/2014  . Dyslipidemia 03/06/2014  . Ejection fraction   . Diverticulitis of colon without hemorrhage 08/31/2013  . Mitral regurgitation   . Tricuspid regurgitation   . HX: anticoagulation   . Diffuse anterior  T-wave inversions 10/24/2012  . PPM-[employer] 07/16/2012  . 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 Shortness of breath   . Aortic stenosis   . Plantar fasciitis of right foot 04/04/2012  . Numbness of toes   . CAD (coronary artery disease)   . Hx of CABG   . Drug therapy   . HTN (hypertension)   . Drug intolerance   . Pulmonic stenosis   . Aortic insufficiency   . Oral anticoagulation   . Hypokalemia   . A-fib ([place])   . QT prolongation on Tikosyn 03/26/2011  . PVC's (premature ventricular contractions)   . Carotid artery disease ([place])   . Elevated bilirubin   . THYROID STIMULATING HORMONE, ABNORMAL 04/01/2010  . DERANGEMENT OF MENISCUS NOT ELSEWHERE CLASSIFIED 12/03/2009  . Knee pain, bilateral 12/03/2009  . BENIGN PROSTATIC HYPERTROPHY, WITH OBSTRUCTION 09/30/2009  . Disorders of bursae and tendons in shoulder region, unspecified 08/22/2009  . 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 ADENOMATOUS COLONIC POLYP 03/06/2008  . ANXIETY DEPRESSION 03/06/2008  . ESOPHAGEAL STENOSIS 03/06/2008  . BARRETTS ESOPHAGUS 03/06/2008  . GASTRITIS, CHRONIC 03/06/2008  . DUODENITIS 03/06/2008  . PROSTATITIS, HX OF 03/06/2008  . OSTEOARTHRITIS, KNEE, LEFT 11/04/2007  . FOOT PAIN, LEFT 11/04/2007  . COLONIC POLYPS, HX OF 08/09/2007    Medications- reviewed and updated Current Outpatient Prescriptions  Medication Sig Dispense Refill  . atorvastatin (LIPITOR) 20 MG tablet  TAKE 1 TABLET ONCE DAILY. 30 tablet 11  . Cholecalciferol 1000 units tablet Take 1,000 Units by mouth daily.    . diclofenac sodium (VOLTAREN) 1 % GEL Use TID-QID to the left thumb 100 g 1  . ELIQUIS 5 MG TABS tablet TAKE 1 TABLET TWICE DAILY. 60 tablet 3  . ketoconazole (NIZORAL) 2 % cream     . nitroGLYCERIN (NITRODUR - DOSED IN MG/24 HR) 0.2 mg/hr patch Use as directed    . pantoprazole (PROTONIX) 40 MG tablet TAKE 1 TABLET  ONCE DAILY. 90 tablet 0  . ramipril (ALTACE) 2.5 MG capsule TAKE (1) CAPSULE DAILY. 30 capsule 6   No current facility-administered medications for this visit.     Objective: Office vital signs reviewed. BP (!) 136/55   Ht 5\' 11"  (1.803 m)   Wt 190 lb (86.2 kg)   BMI 26.50 kg/m    Physical Examination:  General: Awake, alert, well- nourished, NAD  Right knee: Normal to inspection without erythema, ecchymoses, effusion or obvious bony abnormalities.  No obvious Baker's cysts.  Popping noted on the lateral aspect around the IT band with flexion.  ROM almost normal in flexion (140 degrees) and extension (4 degrees) and lower leg rotation. Ligaments with solid consistent endpoints including ACL, PCL, LCL, MCL.  Negative Anterior Drawer. Negative Mcmurray's and Thessaly Non painful patellar compression.  Normal Patellar glide.  No apprehension  Patellar and quadriceps tendons unremarkable. Hamstring and quadriceps strength is normal. Weak hip abductors  Ultrasound of Right knee:  Superior patellar spur calcifications noted in the medial and lateral meniscus. Generally joint space is preserved  Mild swelling of the medial meniscus in one area of mid joint line. Small avulsion fragment with swelling noted at the IT band where it crosses Gerdy's tubercle Possibly calcified and moves with flexion and extension  Mild swelling of the popliteus.tendon with calcifications No suprapatellar pouch swelling noted  Impression is likely small tear in lateral joint capsule or ITB demonstrated with ultrasound  Ultrasound and interpretation by Wolfgang Phoenix. Fields, MD   Assessment/Plan: This is a 71 year old male presenting with lateral knee pain for one week found to have what appears to be an IT band strain on ultrasound. Ultrasound revealed some calcifications of the meniscus, but this is normal considering his age. No significant evidence of arthritis on exam or ultrasound. -Continue with DonJoy  knee sleeves during exercise -Use Voltaren gel as needed for pain -massage to that area regularly with a roller -Provided IT band stretches and strengthening exercises Patient to follow-up as needed for pain    Orders Placed This Encounter  Procedures  . [place] LIMITED JOINT SPACE STRUCTURES LOW RIGHT    Standing Status:   Future    Number of Occurrences:   1    Standing Expiration Date:   06/19/2018    Order Specific Question:   Reason for Exam (SYMPTOM  OR DIAGNOSIS REQUIRED)    Answer:   right knee pain    Order Specific Question:   Preferred imaging location?    Answer:   Internal    No orders of the defined types were placed in this encounter.   Archie Patten PGY-3, [facility]  I observed and examined the patient with the resident and agree with assessment and plan.  Note reviewed and modified by me. Stefanie Libel, MD

## 2017-04-20 NOTE — Assessment & Plan Note (Signed)
I think he got a soft tissue tear during the sit up exercises by locking his feet and rotating knee laterally Looks to be at ITB or capsule This is hurting less and should heal with time/ compression/ HEP and stretches

## 2017-05-13 ENCOUNTER — Encounter: Payer: Self-pay | Admitting: Cardiology

## 2017-06-20 ENCOUNTER — Other Ambulatory Visit: Payer: Self-pay | Admitting: Gastroenterology

## 2017-06-20 ENCOUNTER — Other Ambulatory Visit: Payer: Self-pay | Admitting: Cardiology

## 2017-06-20 DIAGNOSIS — I4892 Unspecified atrial flutter: Secondary | ICD-10-CM

## 2017-06-22 ENCOUNTER — Telehealth: Payer: Self-pay | Admitting: Gastroenterology

## 2017-06-22 MED ORDER — PANTOPRAZOLE SODIUM 40 MG PO TBEC: 40.0000 mg | DELAYED_RELEASE_TABLET | Freq: Every day | ORAL | 0 refills | Status: DC

## 2017-06-22 NOTE — Telephone Encounter (Signed)
Refill sent, pt needs to set up appt for further refills when scheduled is out

## 2017-06-24 ENCOUNTER — Encounter: Payer: Self-pay | Admitting: Internal Medicine

## 2017-06-24 ENCOUNTER — Ambulatory Visit (INDEPENDENT_AMBULATORY_CARE_PROVIDER_SITE_OTHER): Payer: PPO | Admitting: Internal Medicine

## 2017-06-24 VITALS — BP 116/60 | HR 56 | Ht 71.0 in | Wt 194.0 lb

## 2017-06-24 DIAGNOSIS — I491 Atrial premature depolarization: Secondary | ICD-10-CM | POA: Diagnosis not present

## 2017-06-24 DIAGNOSIS — I480 Paroxysmal atrial fibrillation: Secondary | ICD-10-CM | POA: Diagnosis not present

## 2017-06-24 DIAGNOSIS — I495 Sick sinus syndrome: Secondary | ICD-10-CM

## 2017-06-24 DIAGNOSIS — Z950 Presence of cardiac pacemaker: Secondary | ICD-10-CM | POA: Diagnosis not present

## 2017-06-24 DIAGNOSIS — I259 Chronic ischemic heart disease, unspecified: Secondary | ICD-10-CM | POA: Diagnosis not present

## 2017-06-24 NOTE — Patient Instructions (Signed)
Medication Instructions:  Your physician recommends that you continue on your current medications as directed. Please refer to the Current Medication list given to you today.  If you need a refill on your cardiac medications before your next appointment, please call your pharmacy.   Labwork: None ordered  Testing/Procedures: None ordered  Follow-Up: Remote monitoring is used to monitor your Pacemaker of ICD from home. This monitoring reduces the number of office visits required to check your device to one time per year. It allows [place] to keep an eye on the functioning of your device to ensure it is working properly. You are scheduled for a device check from home on 09/23/2017. You may send your transmission at any time that day. If you have a wireless device, the transmission will be sent automatically. After your physician reviews your transmission, you will receive a postcard with your next transmission date.  Your physician wants you to follow-up in: 1 year with Dr. Caryl Comes.  You will receive a reminder letter in the mail two months in advance. If you don't receive a letter, please call our office to schedule the follow-up appointment.  Thank you for choosing [facility]!!

## 2017-06-24 NOTE — Progress Notes (Signed)
Patient Care Team: [name], Evie Lacks, MD as PCP - General (Internal Medicine) Stefanie Libel, MD (Family Medicine) Deboraha Sprang, MD (Cardiology) Jarome Matin, MD as Consulting Physician (Dermatology) Gatha Mayer, MD as Consulting Physician (Gastroenterology)   HPI  Russell Griffith is a 71 y.o. male Seen in followup for atrial fibrillation for which she underwent pulmonary vein isolation at [facility]. He is also status post pacemaker implantation for significant pausing in the context of intermittent atrial fibrillation and also significant sinus node dysfunction. this was addressed by activation rate response of his pacemaker April 2014  Is a history of coronary artery disease with bypass surgery 2000 and a Myoview November 2013 demonstrating no ischemia or scar. LV function was normal by echo February 2014  No ischemia  Has noted that   heart rates are only going 105 with exercise and this will great effort. This is associated with impaired exercise performance leg fatigue. There is no chest pain.  Past Medical History:  Diagnosis Date  . A-fib ([place])   . Aortic stenosis    Mild, echo, April, 2014  . Atrial flutter ([place])    Status post ablation  . BPH (benign prostatic hyperplasia)   . CAD (coronary artery disease)    Myoview March 2014  . Carotid artery disease ([place])   . Chronic foot pain 2009   Resolved with use of orthotics  . Colonic polyp   . Diverticulosis   . Elevated bilirubin    Mild chronic elevation, 2.0 January, 2011 stable  . GERD (gastroesophageal reflux disease)    Barrett's esophagus  . Hemorrhoids    September, 2012  . HTN (hypertension)   . HX: anticoagulation   . Hyperlipidemia    Low HDL  . 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 Primary osteoarthritis of left knee    Mild  . Prolapsed internal hemorrhoids, grade 3 08/12/2015  . PVC's (premature ventricular contractions)   . Tubular adenoma of colon     Past Surgical  History:  Procedure Laterality Date  . COLONOSCOPY    . CORONARY ARTERY BYPASS GRAFT  2000   CABG X5  . ESOPHAGOGASTRODUODENOSCOPY    . HEMORRHOID BANDING    . INSERT / REPLACE / REMOVE PACEMAKER  07/15/2012   initial placement  . PERMANENT PACEMAKER INSERTION N/A 07/15/2012   Procedure: PERMANENT PACEMAKER INSERTION;  Surgeon: Deboraha Sprang, MD;  Location: [facility] CATH LAB;  Service: Cardiovascular;  Laterality: N/A;  . radiofrequency  ablation for heart arrythmia    . TONSILLECTOMY  1956    Current Outpatient Prescriptions  Medication Sig Dispense Refill  . atorvastatin (LIPITOR) 20 MG tablet TAKE 1 TABLET ONCE DAILY. 30 tablet 11  . diclofenac sodium (VOLTAREN) 1 % GEL Use TID-QID to the left thumb 100 g 1  . ELIQUIS 5 MG TABS tablet TAKE 1 TABLET TWICE DAILY. 60 tablet 1  . Multiple Vitamin (MULTIVITAMIN) tablet Take 1 tablet by mouth daily.    . pantoprazole (PROTONIX) 40 MG tablet Take 1 tablet (40 mg total) by mouth daily. 90 tablet 0  . ramipril (ALTACE) 2.5 MG capsule TAKE (1) CAPSULE DAILY. 30 capsule 6  . ketoconazole (NIZORAL) 2 % cream      No current facility-administered medications for this visit.     Allergies  Allergen Reactions  . [employer] w/paprika too  . Niacin And Related   . Sulfonamide Derivatives Other (See Comments)    "  have no idea; mother told me I was allergic to"    Review of Systems negative except from HPI and PMH  Physical Exam BP 116/60   Pulse (!) 56   Ht 5\' 11"  (1.803 m)   Wt 194 lb (88 kg)   SpO2 98%   BMI 27.06 kg/m  Well developed and nourished in no acute distress HENT normal Neck supple with JVP-flat Carotids brisk and full without bruits Clear Regular rate and rhythm, no murmurs or gallops Abd-soft with active BS without hepatomegaly No Clubbing cyanosis edema Skin-warm and dry A & Oriented  Grossly normal sensory and motor function  ECG demonstrates atrial pacing at yes Intervals 21/09/43  Assessment and   Plan  Atrial fibrillation s/p ablation  PACs  Sinus node dysfunction  Ischemic heart disease with prior bypass surgery  Pacemaker  [employer]  The patient's chronotropic excursion is markedly limited compared to pre-2016. This is constituted both by the loss of intrinsic sinus node function as well as heart rate excursion driven by his device. The reason for the latter is not clear. We have reprogrammed minute ventilation sensitivity, threshold for excellent ROM in her accelerometer slope in the hopes that we can generate an exercise heart rate in the 120-1:30 range. I've advised him to be  kind event of the potential for inducing ischemia. He's had no chest pain but he does have a 71 year old grafts.   More than 50% of 40 min was spent in counseling related to the above

## 2017-06-25 LAB — CUP PACEART INCLINIC DEVICE CHECK
Implantable Lead Implant Date: 20130816
Implantable Lead Location: 753860
Implantable Lead Model: 5076
Implantable Pulse Generator Implant Date: 20130816
Lead Channel Impedance Value: 529 Ohm
Lead Channel Pacing Threshold Amplitude: 0.8 V
Lead Channel Pacing Threshold Amplitude: 1.4 V
Lead Channel Sensing Intrinsic Amplitude: 1.4 mV
Lead Channel Sensing Intrinsic Amplitude: 25 mV
Lead Channel Setting Pacing Pulse Width: 0.7 ms
Lead Channel Setting Sensing Sensitivity: 2.5 mV
MDC IDC LEAD IMPLANT DT: 20130816
MDC IDC LEAD LOCATION: 753859
MDC IDC MSMT LEADCHNL RA IMPEDANCE VALUE: 581 Ohm
MDC IDC MSMT LEADCHNL RA PACING THRESHOLD PULSEWIDTH: 0.4 ms
MDC IDC MSMT LEADCHNL RV PACING THRESHOLD PULSEWIDTH: 0.7 ms
MDC IDC SESS DTM: 20180726040000
MDC IDC SET LEADCHNL RA PACING AMPLITUDE: 2 V
MDC IDC SET LEADCHNL RV PACING AMPLITUDE: 2.8 V
MDC IDC STAT BRADY RA PERCENT PACED: 96 %
MDC IDC STAT BRADY RV PERCENT PACED: 9 %
Pulse Gen Serial Number: 111255

## 2017-06-28 NOTE — Progress Notes (Signed)
Pre visit review using our clinic review tool, if applicable. No additional management support is needed unless otherwise documented below in the visit note. 

## 2017-06-28 NOTE — Progress Notes (Addendum)
Subjective:   Russell Griffith is a 71 y.o. male who presents for Medicare Annual/Subsequent preventive examination.  Review of Systems:  No ROS.  Medicare Wellness Visit. Additional risk factors are reflected in the social history.  Cardiac Risk Factors include: advanced age (>72mn, >>60women);dyslipidemia;hypertension;male gender Sleep patterns: feels rested on waking, gets up 1 times nightly to void and sleeps 7-8 hours nightly.    Home Safety/Smoke Alarms: Feels safe in home. Smoke alarms in place.  Living environment; residence and Firearm Safety: no firearms. Seat Belt Safety/Bike Helmet: Wears seat belt.   Counseling:   Eye Exam- appointment yearly Dental- appointment every 6 months   Male:   CCS- Last 05/08/15, recall 5 years    PSA-  Lab Results  Component Value Date   PSA 1.46 08/06/2016   PSA 0.94 07/30/2015   PSA 1.22 07/03/2014       Objective:    Vitals: BP 128/72   Pulse 66   Resp 20   Ht '5\' 11"'  (1.803 m)   Wt 196 lb (88.9 kg)   SpO2 99%   BMI 27.34 kg/m   Body mass index is 27.34 kg/m.  Tobacco History  Smoking Status  . Never Smoker  Smokeless Tobacco  . Never Used     Counseling given: Not Answered   Past Medical History:  Diagnosis Date  . A-fib ([facility]   . Aortic stenosis    Mild, echo, April, 2014  . Atrial flutter ([facility]    Status post ablation  . BPH (benign prostatic hyperplasia)   . CAD (coronary artery disease)    Myoview March 2014  . Carotid artery disease ([facility]   . Chronic foot pain 2009   Resolved with use of orthotics  . Colonic polyp   . Diverticulosis   . Elevated bilirubin    Mild chronic elevation, 2.0 January, 2011 stable  . GERD (gastroesophageal reflux disease)    Barrett's esophagus  . Hemorrhoids    September, 2012  . HTN (hypertension)   . HX: anticoagulation   . Hyperlipidemia    Low HDL  . 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 Primary osteoarthritis of left knee    Mild  .  Prolapsed internal hemorrhoids, grade 3 08/12/2015  . PVC's (premature ventricular contractions)   . Tubular adenoma of colon    Past Surgical History:  Procedure Laterality Date  . COLONOSCOPY    . CORONARY ARTERY BYPASS GRAFT  2000   CABG X5  . ESOPHAGOGASTRODUODENOSCOPY    . HEMORRHOID BANDING    . INSERT / REPLACE / REMOVE PACEMAKER  07/15/2012   initial placement  . PERMANENT PACEMAKER INSERTION N/A 07/15/2012   Procedure: PERMANENT PACEMAKER INSERTION;  Surgeon: SDeboraha Sprang MD;  [facility];  Service: Cardiovascular;  Laterality: N/A;  . radiofrequency  ablation for heart arrythmia    . TONSILLECTOMY  1956   Family History  Problem Relation Age of Onset  . Coronary artery disease Other 70  . Diabetes Other   . Multiple myeloma Mother   . Diabetes Father   . Renal Disease Father   . Hyperlipidemia Unknown   . Hypertension Unknown   . Colon cancer Neg Hx   . Esophageal cancer Neg Hx   . Stomach cancer Neg Hx   . Rectal cancer Neg Hx    History  Sexual Activity  . Sexual activity: Yes  . Partners: Female  Outpatient Encounter Prescriptions as of 06/29/2017  Medication Sig  . atorvastatin (LIPITOR) 20 MG tablet TAKE 1 TABLET ONCE DAILY.  Marland Fishbaugh diclofenac sodium (VOLTAREN) 1 % GEL Use TID-QID to the left thumb  . ELIQUIS 5 MG TABS tablet TAKE 1 TABLET TWICE DAILY.  Marland Flinders ketoconazole (NIZORAL) 2 % cream   . Multiple Vitamin (MULTIVITAMIN) tablet Take 1 tablet by mouth daily.  . pantoprazole (PROTONIX) 40 MG tablet Take 1 tablet (40 mg total) by mouth daily.  . ramipril (ALTACE) 2.5 MG capsule TAKE (1) CAPSULE DAILY.   No facility-administered encounter medications on file as of 06/29/2017.     Activities of Daily Living In your present state of health, do you have any difficulty performing the following activities: 06/29/2017  Hearing? N  Vision? N  Difficulty concentrating or making decisions? N  Walking or climbing stairs? N  Dressing or bathing? N    Doing errands, shopping? N  Preparing Food and eating ? N  Using the Toilet? N  In the past six months, have you accidently leaked urine? N  Do you have problems with loss of bowel control? N  Managing your Medications? N  Managing your Finances? N  Housekeeping or managing your Housekeeping? N  Some recent data might be hidden    Patient Care Team: [name], Evie Lacks, MD as PCP - General (Internal Medicine) Stefanie Libel, MD (Family Medicine) Deboraha Sprang, MD (Cardiology) Jarome Matin, MD as Consulting Physician (Dermatology) Milus Banister, MD as Attending Physician (Gastroenterology) Minus Breeding, MD as Consulting Physician (Cardiology)   Assessment:    Physical assessment deferred to PCP.  Exercise Activities and Dietary recommendations Current Exercise Habits: Home exercise routine, Type of exercise: strength training/weights;calisthenics (elliptical), Time (Minutes): 30, Frequency (Times/Week): 3, Weekly Exercise (Minutes/Week): 90, Intensity: Moderate, Exercise limited by: None identified Diet (meal preparation, eat out, water intake, caffeinated beverages, dairy products, fruits and vegetables): in general, a "healthy" diet  , well balanced, low fat/ cholesterol, low salt,   encouraged patient to increase daily water intake.  Goals    . lose weight to get to 180          Continue to decrease carbohydrates at most meals, exercise more by increasing  Exercise to 5 times per week.       Fall Risk Fall Risk  06/29/2017 08/06/2016 03/06/2015 02/04/2015 12/03/2014  Falls in the past year? No No No No No  Risk for fall due to : - - Other (Comment) Other (Comment) -   Depression Screen PHQ 2/9 Scores 06/29/2017 08/06/2016 03/06/2015 02/04/2015  PHQ - 2 Score 0 0 0 0  Exception Documentation - - Other- indicate reason in comment box Other- indicate reason in comment box    Cognitive Function       Ad8 score reviewed for issues:  Issues making decisions: no  Less  interest in hobbies / activities: no  Repeats questions, stories (family complaining): no  Trouble using ordinary gadgets (microwave, computer, phone):no  Forgets the month or year: no  Mismanaging finances: no  Remembering appts: no  Daily problems with thinking and/or memory: no Ad8 score is= 0  Immunization History  Administered Date(s) Administered  . Influenza Whole 08/31/2009  . Influenza,inj,Quad PF,36+ Mos 08/06/2016  . Influenza-Unspecified 08/24/2014, 08/30/2015  . Pneumococcal Conjugate-13 09/13/2013  . Pneumococcal Polysaccharide-23 03/28/2009, 07/23/2015  . Td 03/28/2009  . Zoster 09/13/2013   Screening Tests Health Maintenance  Topic Date Due  . INFLUENZA VACCINE  06/30/2017  . TETANUS/TDAP  03/29/2019  . COLONOSCOPY  05/07/2020  . Hepatitis C Screening  Completed  . PNA vac Low Risk Adult  Completed      Plan:    Continue doing brain stimulating activities (puzzles, reading, adult coloring books, staying active) to keep memory sharp.   Continue to eat heart healthy diet (full of fruits, vegetables, whole grains, lean protein, water--limit salt, fat, and sugar intake) and increase physical activity as tolerated.  I have personally reviewed and noted the following in the patient's chart:   . Medical and social history . Use of alcohol, tobacco or illicit drugs  . Current medications and supplements . Functional ability and status . Nutritional status . Physical activity . Advanced directives . List of other physicians . Vitals . Screenings to include cognitive, depression, and falls . Referrals and appointments  In addition, I have reviewed and discussed with patient certain preventive protocols, quality metrics, and best practice recommendations. A written personalized care plan for preventive services as well as general preventive health recommendations were provided to patient.     Michiel Cowboy, RN  06/29/2017  Medical screening  examination/treatment/procedure(s) were performed by non-physician practitioner and as supervising physician I was immediately available for consultation/collaboration. I agree with above. Cathlean Cower, MD

## 2017-06-29 ENCOUNTER — Ambulatory Visit (INDEPENDENT_AMBULATORY_CARE_PROVIDER_SITE_OTHER): Payer: PPO | Admitting: *Deleted

## 2017-06-29 VITALS — BP 128/72 | HR 66 | Resp 20 | Ht 71.0 in | Wt 196.0 lb

## 2017-06-29 DIAGNOSIS — Z0000 Encounter for general adult medical examination without abnormal findings: Secondary | ICD-10-CM | POA: Diagnosis not present

## 2017-06-29 NOTE — Patient Instructions (Signed)
Continue doing brain stimulating activities (puzzles, reading, adult coloring books, staying active) to keep memory sharp.   Continue to eat heart healthy diet (full of fruits, vegetables, whole grains, lean protein, water--limit salt, fat, and sugar intake) and increase physical activity as tolerated.   Russell Griffith , Thank you for taking time to come for your Medicare Wellness Visit. I appreciate your ongoing commitment to your health goals. Please review the following plan we discussed and let me know if I can assist you in the future.   These are the goals we discussed: Goals    . lose weight to get to 180          Continue to decrease carbohydrates at most meals, exercise more by increasing to doing exercise 5 times per week.        This is a list of the screening recommended for you and due dates:  Health Maintenance  Topic Date Due  . Flu Shot  06/30/2017  . Tetanus Vaccine  03/29/2019  . Colon Cancer Screening  05/07/2020  .  Hepatitis C: One time screening is recommended by Center for Disease Control  (CDC) for  adults born from [birth year] through 1963.   Completed  . Pneumonia vaccines  Completed

## 2017-08-09 ENCOUNTER — Encounter: Payer: PPO | Admitting: Internal Medicine

## 2017-08-12 ENCOUNTER — Encounter: Payer: Self-pay | Admitting: Internal Medicine

## 2017-08-12 ENCOUNTER — Ambulatory Visit (INDEPENDENT_AMBULATORY_CARE_PROVIDER_SITE_OTHER): Payer: PPO | Admitting: Internal Medicine

## 2017-08-12 ENCOUNTER — Other Ambulatory Visit (INDEPENDENT_AMBULATORY_CARE_PROVIDER_SITE_OTHER): Payer: PPO

## 2017-08-12 VITALS — BP 122/64 | HR 55 | Temp 98.1°F | Ht 71.0 in | Wt 189.0 lb

## 2017-08-12 DIAGNOSIS — R7989 Other specified abnormal findings of blood chemistry: Secondary | ICD-10-CM

## 2017-08-12 DIAGNOSIS — R946 Abnormal results of thyroid function studies: Secondary | ICD-10-CM | POA: Diagnosis not present

## 2017-08-12 DIAGNOSIS — Z0000 Encounter for general adult medical examination without abnormal findings: Secondary | ICD-10-CM

## 2017-08-12 DIAGNOSIS — I10 Essential (primary) hypertension: Secondary | ICD-10-CM

## 2017-08-12 DIAGNOSIS — Z0001 Encounter for general adult medical examination with abnormal findings: Secondary | ICD-10-CM | POA: Diagnosis not present

## 2017-08-12 DIAGNOSIS — N320 Bladder-neck obstruction: Secondary | ICD-10-CM

## 2017-08-12 DIAGNOSIS — Z23 Encounter for immunization: Secondary | ICD-10-CM | POA: Diagnosis not present

## 2017-08-12 DIAGNOSIS — E785 Hyperlipidemia, unspecified: Secondary | ICD-10-CM

## 2017-08-12 LAB — URINALYSIS
BILIRUBIN URINE: NEGATIVE
HGB URINE DIPSTICK: NEGATIVE
KETONES UR: 15 — AB
Leukocytes, UA: NEGATIVE
Nitrite: NEGATIVE
Specific Gravity, Urine: 1.025 (ref 1.000–1.030)
TOTAL PROTEIN, URINE-UPE24: NEGATIVE
URINE GLUCOSE: NEGATIVE
UROBILINOGEN UA: 1 (ref 0.0–1.0)
pH: 5.5 (ref 5.0–8.0)

## 2017-08-12 LAB — CBC WITH DIFFERENTIAL/PLATELET
BASOS PCT: 0.7 % (ref 0.0–3.0)
Basophils Absolute: 0 10*3/uL (ref 0.0–0.1)
EOS PCT: 1.2 % (ref 0.0–5.0)
Eosinophils Absolute: 0.1 10*3/uL (ref 0.0–0.7)
HCT: 42.3 % (ref 39.0–52.0)
HEMOGLOBIN: 14.3 g/dL (ref 13.0–17.0)
LYMPHS PCT: 25.3 % (ref 12.0–46.0)
Lymphs Abs: 1.6 10*3/uL (ref 0.7–4.0)
MCHC: 33.7 g/dL (ref 30.0–36.0)
MCV: 88.3 fl (ref 78.0–100.0)
MONO ABS: 1 10*3/uL (ref 0.1–1.0)
MONOS PCT: 16.7 % — AB (ref 3.0–12.0)
NEUTROS PCT: 56.1 % (ref 43.0–77.0)
Neutro Abs: 3.5 10*3/uL (ref 1.4–7.7)
Platelets: 176 10*3/uL (ref 150.0–400.0)
RBC: 4.79 Mil/uL (ref 4.22–5.81)
RDW: 14.3 % (ref 11.5–15.5)
WBC: 6.2 10*3/uL (ref 4.0–10.5)

## 2017-08-12 LAB — LIPID PANEL
Cholesterol: 113 mg/dL (ref 0–200)
HDL: 41.7 mg/dL (ref >39.00)
LDL Cholesterol: 61 mg/dL (ref 0–99)
NONHDL: 71.62
Total CHOL/HDL Ratio: 3
Triglycerides: 54 mg/dL (ref 0.0–149.0)
VLDL: 10.8 mg/dL (ref 0.0–40.0)

## 2017-08-12 LAB — HEPATIC FUNCTION PANEL
ALK PHOS: 91 U/L (ref 39–117)
ALT: 27 U/L (ref 0–53)
AST: 27 U/L (ref 0–37)
Albumin: 4.4 g/dL (ref 3.5–5.2)
BILIRUBIN DIRECT: 0.5 mg/dL — AB (ref 0.0–0.3)
Total Bilirubin: 3.2 mg/dL — ABNORMAL HIGH (ref 0.2–1.2)
Total Protein: 6.7 g/dL (ref 6.0–8.3)

## 2017-08-12 LAB — PSA: PSA: 0.96 ng/mL (ref 0.10–4.00)

## 2017-08-12 LAB — BASIC METABOLIC PANEL
BUN: 23 mg/dL (ref 6–23)
CHLORIDE: 104 meq/L (ref 96–112)
CO2: 28 meq/L (ref 19–32)
Calcium: 9.8 mg/dL (ref 8.4–10.5)
Creatinine, Ser: 1.02 mg/dL (ref 0.40–1.50)
GFR: 76.84 mL/min (ref >60.00)
Glucose, Bld: 90 mg/dL (ref 70–99)
POTASSIUM: 4.1 meq/L (ref 3.5–5.1)
SODIUM: 140 meq/L (ref 135–145)

## 2017-08-12 LAB — TSH: TSH: 5.09 u[IU]/mL — AB (ref 0.35–4.50)

## 2017-08-12 LAB — T4, FREE: Free T4: 0.99 ng/dL (ref 0.60–1.60)

## 2017-08-12 MED ORDER — ZOSTER VAC RECOMB ADJUVANTED 50 MCG/0.5ML IM SUSR: 0.5000 mL | Freq: Once | INTRAMUSCULAR | 1 refills | Status: AC

## 2017-08-12 NOTE — Patient Instructions (Signed)
Try CoQ10 for 1-2 months

## 2017-08-12 NOTE — Assessment & Plan Note (Signed)
Labs

## 2017-08-12 NOTE — Assessment & Plan Note (Signed)
Here for medicare wellness/physical  Diet: heart healthy  Physical activity: not sedentary  Depression/mood screen: negative  Hearing: intact to whispered voice  Visual acuity: grossly normal w/glasses, performs annual eye exam  ADLs: capable  Fall risk: low to none  Home safety: good  Cognitive evaluation: intact to orientation, naming, recall and repetition  EOL planning: adv directives, full code/ I agree  I have personally reviewed and have noted  1. The patient's medical, surgical and social history  2. Their use of alcohol, tobacco or illicit drugs  3. Their current medications and supplements  4. The patient's functional ability including ADL's, fall risks, home safety risks and hearing or visual impairment.  5. Diet and physical activities  6. Evidence for depression or mood disorders 7. The roster of all physicians providing medical care to patient - is listed in the Snapshot section of the chart and reviewed today.    Today patient counseled on age appropriate routine health concerns for screening and prevention, each reviewed and up to date or declined. Immunizations reviewed and up to date or declined. Labs ordered and reviewed. Risk factors for depression reviewed and negative. Hearing function and visual acuity are intact. ADLs screened and addressed as needed. Functional ability and level of safety reviewed and appropriate. Education, counseling and referrals performed based on assessed risks today. Patient provided with a copy of personalized plan for preventive services.  Colon Dr Ardis Hughs 2016 - polyps Derm q 12 mo

## 2017-08-12 NOTE — Assessment & Plan Note (Signed)
Ft4

## 2017-08-12 NOTE — Progress Notes (Signed)
Subjective:  Patient ID: Russell Griffith, male    DOB: 04-Mar-1946  Age: 71 y.o. MRN: [id number]  CC: No chief complaint on file.   HPI Russell Griffith presents for well exam  Outpatient Medications Prior to Visit  Medication Sig Dispense Refill  . atorvastatin (LIPITOR) 20 MG tablet TAKE 1 TABLET ONCE DAILY. 30 tablet 11  . diclofenac sodium (VOLTAREN) 1 % GEL Use TID-QID to the left thumb 100 g 1  . ELIQUIS 5 MG TABS tablet TAKE 1 TABLET TWICE DAILY. 60 tablet 1  . ketoconazole (NIZORAL) 2 % cream     . Multiple Vitamin (MULTIVITAMIN) tablet Take 1 tablet by mouth daily.    . pantoprazole (PROTONIX) 40 MG tablet Take 1 tablet (40 mg total) by mouth daily. 90 tablet 0  . ramipril (ALTACE) 2.5 MG capsule TAKE (1) CAPSULE DAILY. 30 capsule 6   No facility-administered medications prior to visit.     ROS Review of Systems  Constitutional: Negative for appetite change, fatigue and unexpected weight change.  HENT: Negative for congestion, nosebleeds, sneezing, sore throat and trouble swallowing.   Eyes: Negative for itching and visual disturbance.  Respiratory: Negative for cough.   Cardiovascular: Negative for chest pain, palpitations and leg swelling.  Gastrointestinal: Negative for abdominal distention, blood in stool, diarrhea and nausea.  Genitourinary: Negative for frequency and hematuria.  Musculoskeletal: Negative for back pain, gait problem, joint swelling and neck pain.  Skin: Negative for rash.  Neurological: Negative for dizziness, tremors, speech difficulty and weakness.  Psychiatric/Behavioral: Negative for agitation, dysphoric mood and sleep disturbance. The patient is not nervous/anxious.     Objective:  BP 122/64 (BP Location: Left Arm, Patient Position: Sitting, Cuff Size: Large)   Pulse (!) 55   Temp 98.1 F (36.7 C) (Oral)   Ht 5\' 11"  (1.803 m)   Wt 189 lb (85.7 kg)   SpO2 99%   BMI 26.36 kg/m   BP Readings from Last 3 Encounters:  08/12/17 122/64    06/29/17 128/72  06/24/17 116/60    Wt Readings from Last 3 Encounters:  08/12/17 189 lb (85.7 kg)  06/29/17 196 lb (88.9 kg)  06/24/17 194 lb (88 kg)    Physical Exam  Constitutional: He is oriented to person, place, and time. He appears well-developed and well-nourished. No distress.  NAD  HENT:  Head: Normocephalic and atraumatic.  Right Ear: External ear normal.  Left Ear: External ear normal.  Nose: Nose normal.  Mouth/Throat: Oropharynx is clear and moist. No oropharyngeal exudate.  Eyes: Pupils are equal, round, and reactive to light. Conjunctivae and EOM are normal. Right eye exhibits no discharge. Left eye exhibits no discharge. No scleral icterus.  Neck: Normal range of motion. Neck supple. No JVD present. No tracheal deviation present. No thyromegaly present.  Cardiovascular: Normal rate, regular rhythm, normal heart sounds and intact distal pulses.  Exam reveals no gallop and no friction rub.   No murmur heard. Pulmonary/Chest: Effort normal and breath sounds normal. No stridor. No respiratory distress. He has no wheezes. He has no rales. He exhibits no tenderness.  Abdominal: Soft. Bowel sounds are normal. He exhibits no distension and no mass. There is no tenderness. There is no rebound and no guarding.  Genitourinary: Rectum normal, prostate normal and penis normal. Rectal exam shows guaiac negative stool. No penile tenderness.  Musculoskeletal: Normal range of motion. He exhibits no edema or tenderness.  Lymphadenopathy:    He has no cervical adenopathy.  Neurological: He  is alert and oriented to person, place, and time. He has normal reflexes. No cranial nerve deficit. He exhibits normal muscle tone. He displays a negative Romberg sign. Coordination and gait normal.  Skin: Skin is warm and dry. No rash noted. He is not diaphoretic. No erythema. No pallor.  Psychiatric: He has a normal mood and affect. His behavior is normal. Judgment and thought content normal.     Lab Results  Component Value Date   WBC 6.2 08/06/2016   HGB 15.1 08/06/2016   HCT 44.5 08/06/2016   PLT 165.0 08/06/2016   GLUCOSE 110 (H) 08/06/2016   CHOL 118 08/06/2016   TRIG 57.0 08/06/2016   HDL 33.70 (L) 08/06/2016   LDLCALC 73 08/06/2016   ALT 28 08/06/2016   AST 24 08/06/2016   NA 141 08/06/2016   K 4.5 08/06/2016   CL 105 08/06/2016   CREATININE 1.07 08/06/2016   BUN 21 08/06/2016   CO2 32 08/06/2016   TSH 4.85 (H) 08/06/2016   PSA 1.46 08/06/2016   INR 1.53 (H) 07/15/2012   HGBA1C 5.8 07/30/2015    No results found.  Assessment & Plan:   There are no diagnoses linked to this encounter. I am having Mr. Lant maintain his ketoconazole, diclofenac sodium, atorvastatin, ramipril, ELIQUIS, pantoprazole, and multivitamin.  No orders of the defined types were placed in this encounter.    Follow-up: No Follow-up on file.  Walker Kehr, MD

## 2017-08-17 DIAGNOSIS — Z85828 Personal history of other malignant neoplasm of skin: Secondary | ICD-10-CM | POA: Diagnosis not present

## 2017-08-17 DIAGNOSIS — D1801 Hemangioma of skin and subcutaneous tissue: Secondary | ICD-10-CM | POA: Diagnosis not present

## 2017-08-17 DIAGNOSIS — L821 Other seborrheic keratosis: Secondary | ICD-10-CM | POA: Diagnosis not present

## 2017-08-17 DIAGNOSIS — L812 Freckles: Secondary | ICD-10-CM | POA: Diagnosis not present

## 2017-08-17 DIAGNOSIS — D2371 Other benign neoplasm of skin of right lower limb, including hip: Secondary | ICD-10-CM | POA: Diagnosis not present

## 2017-08-19 ENCOUNTER — Encounter: Payer: Self-pay | Admitting: Sports Medicine

## 2017-08-19 ENCOUNTER — Ambulatory Visit: Payer: Self-pay

## 2017-08-19 ENCOUNTER — Ambulatory Visit (INDEPENDENT_AMBULATORY_CARE_PROVIDER_SITE_OTHER): Payer: PPO | Admitting: Sports Medicine

## 2017-08-19 ENCOUNTER — Other Ambulatory Visit: Payer: Self-pay | Admitting: Cardiology

## 2017-08-19 VITALS — BP 110/60 | Ht 71.0 in | Wt 185.0 lb

## 2017-08-19 DIAGNOSIS — M25561 Pain in right knee: Secondary | ICD-10-CM | POA: Diagnosis not present

## 2017-08-19 DIAGNOSIS — M25562 Pain in left knee: Secondary | ICD-10-CM | POA: Diagnosis not present

## 2017-08-19 DIAGNOSIS — I4892 Unspecified atrial flutter: Secondary | ICD-10-CM

## 2017-08-19 MED ORDER — COLCHICINE 0.6 MG PO TABS: 0.6000 mg | ORAL_TABLET | Freq: Two times a day (BID) | ORAL | 1 refills | Status: DC

## 2017-08-19 MED ORDER — TRIAMCINOLONE ACETONIDE 10 MG/ML IJ SUSP
10.0000 mg | Freq: Once | INTRAMUSCULAR | Status: AC
  Administered 2017-08-19: 10 mg via INTRA_ARTICULAR

## 2017-08-19 NOTE — Assessment & Plan Note (Signed)
Ultrasound suggests some degenerative meniscal changes  However pain seems to arise from soft tissue calcifications This is most suggestive of CPPD type reactin Note this is similar to what I treated in his PF  Trial with CSI today Add colchicine 0.6 mg bid x 7 days and then qd for 2 to 3 weeks  Keep up exercise program as he has stable ROM and joint space

## 2017-08-19 NOTE — Progress Notes (Signed)
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 and keep him from being able to do so  Past Hx Plantar fascia calcification responded to direct barbotage  Hx of CAD and valvular heart Dz now stable  ROS No giving way Pain medial on RT Pain more superior on left  PE Pleasant M in NAD BP 110/60   Ht 5\' 11"  (1.803 m)   Wt 185 lb (83.9 kg)   BMI 25.80 kg/m   RT Knee Good ROM No swelling Clicking along lateral knee capsule Stable ligaments Strength is good  LT Knee Clicks at superior lateral aspect No swelling Good ROM Stable ligaments Good strength  Ultrasound of Bilateral Knees RT Knee Calcific deposit in lateral capsule No suprapatellar pouch effusion Calcificatoin in meniscus medial and lateral Preserved joint space Patellar and quad tendons normal  LT Knee Calcific density under vastus lateralis tendon just above patella No suprapatellar pouch effusion Calcification in meniscus medial and lateral Joint space preserved Patellar and quad tendons normal  Impression:  Ultrasound shows calcifications in soft tissue; mild calcification and degenerative changes in meniscii.  No effusions.  Joint space preserved.  Procedure:  Injection of Left vastus lateralis tendon  Consent obtained and verified. Time-out conducted. Noted no overlying erythema, induration, or other signs of local infection. Skin prepped in a sterile fashion. Topical analgesic spray: Ethyl chloride. Completed without difficulty.  I used ultrasound to locate the calcification under the vastus lateralis tendon and guided the injection to inflitrate this area. Meds: 1 cc kenalog 10 and 1.5 cc lidocaine 1% Pain immediately improved suggesting accurate placement of the medication. Advised to call if  fevers/chills, erythema, induration, drainage, or persistent bleeding.

## 2017-09-23 ENCOUNTER — Ambulatory Visit (INDEPENDENT_AMBULATORY_CARE_PROVIDER_SITE_OTHER): Payer: PPO | Admitting: *Deleted

## 2017-09-23 DIAGNOSIS — I495 Sick sinus syndrome: Secondary | ICD-10-CM

## 2017-09-23 NOTE — Progress Notes (Signed)
Remote pacemaker transmission.   

## 2017-09-24 ENCOUNTER — Encounter: Payer: Self-pay | Admitting: Cardiology

## 2017-09-24 LAB — CUP PACEART REMOTE DEVICE CHECK
Brady Statistic RV Percent Paced: 23 %
Date Time Interrogation Session: 20181023064800
Implantable Lead Implant Date: 20130816
Implantable Lead Location: 753860
Implantable Lead Model: 5076
Implantable Pulse Generator Implant Date: 20130816
Lead Channel Impedance Value: 522 Ohm
Lead Channel Impedance Value: 547 Ohm
Lead Channel Pacing Threshold Amplitude: 0.8 V
Lead Channel Pacing Threshold Pulse Width: 0.4 ms
MDC IDC LEAD IMPLANT DT: 20130816
MDC IDC LEAD LOCATION: 753859
MDC IDC MSMT BATTERY REMAINING LONGEVITY: 102 mo
MDC IDC MSMT BATTERY REMAINING PERCENTAGE: 100 %
MDC IDC SET LEADCHNL RA PACING AMPLITUDE: 2 V
MDC IDC SET LEADCHNL RV PACING AMPLITUDE: 2.8 V
MDC IDC SET LEADCHNL RV PACING PULSEWIDTH: 0.7 ms
MDC IDC SET LEADCHNL RV SENSING SENSITIVITY: 2.5 mV
MDC IDC STAT BRADY RA PERCENT PACED: 93 %
Pulse Gen Serial Number: 111255

## 2017-09-26 ENCOUNTER — Other Ambulatory Visit: Payer: Self-pay | Admitting: Gastroenterology

## 2017-09-27 ENCOUNTER — Telehealth: Payer: Self-pay | Admitting: Gastroenterology

## 2017-09-27 MED ORDER — PANTOPRAZOLE SODIUM 40 MG PO TBEC: 40.0000 mg | DELAYED_RELEASE_TABLET | Freq: Every day | ORAL | 1 refills | Status: DC

## 2017-09-27 NOTE — Telephone Encounter (Signed)
Medication filled for 2 months. Patient has an appt Jan 7th, 2019.

## 2017-11-08 ENCOUNTER — Other Ambulatory Visit: Payer: Self-pay | Admitting: Cardiology

## 2017-12-01 ENCOUNTER — Other Ambulatory Visit: Payer: Self-pay | Admitting: Cardiology

## 2017-12-01 DIAGNOSIS — I4892 Unspecified atrial flutter: Secondary | ICD-10-CM

## 2017-12-06 ENCOUNTER — Other Ambulatory Visit: Payer: Self-pay

## 2017-12-06 ENCOUNTER — Ambulatory Visit: Payer: PPO | Admitting: Gastroenterology

## 2017-12-06 ENCOUNTER — Encounter: Payer: Self-pay | Admitting: Gastroenterology

## 2017-12-06 VITALS — BP 132/80 | HR 72 | Ht 71.0 in | Wt 190.1 lb

## 2017-12-06 DIAGNOSIS — K219 Gastro-esophageal reflux disease without esophagitis: Secondary | ICD-10-CM | POA: Diagnosis not present

## 2017-12-06 MED ORDER — PANTOPRAZOLE SODIUM 20 MG PO TBEC: 20.0000 mg | DELAYED_RELEASE_TABLET | Freq: Every day | ORAL | 11 refills | Status: DC

## 2017-12-06 NOTE — Patient Instructions (Addendum)
Trial of pantoprazole 20mg  strength. One pill once daily, disp 30 with 11 refills.  Call if this is not effectively controlling your heartburn.  Normal BMI (Body Mass Index- based on height and weight) is between 23 and 30. Your BMI today is Body mass index is 26.52 kg/m. Russell Griffith Please consider follow up  regarding your BMI with your Primary Care Provider.

## 2017-12-06 NOTE — Progress Notes (Signed)
Review of pertinent gastrointestinal problems: 1. History of Barrett's esophagus. No dysplasia seen on serial endoscopies 2004, 2005, 2007. Most recent EGD 2009 found short segment of Barrett's appearing mucosa, biopsies did not confirm intestinal metaplasia however.   EGD may 2011 also found short segment, non-nodular Barrett's mucosa however biopsies again showed no intestinal metaplasia.  EGD Dr. Ardis Hughs 10/2012 slightly irregular z line, non-nodular, biopsies showed no IM, no further surveillance was recommended. 2. Personal history of precancerous colon polyps. Adenomatous polyp removed 2003 by Dr. Velora Heckler. Followup colonoscopy 2005 found no polyps. Dr. Inocente Salles recommended that he have a repeat colonoscopy at 7 year interval.  Colonoscopy may 2011 found left-sided diverticulosis, small colon polyp that was an adenoma. Colonoscopy 05/2015 Dr. Ardis Hughs found single subCM adenoma, internal hems, diverticulosis. Recommended to have surveillance colonoscopy at 5 year interval. 3. Internal hems. Banded by Dr. Carlean Purl [phone].  HPI: This is a very pleasant 72 year old man whom I last saw 2-3 years ago at the time of colonoscopy for polyp surveillance.  Chief complaint is chronic GERD, history of Barrett's, intermittent gas and bloating  He takes pantoprazole 40 mg daily for GERD, previous history of Barrett's for which we have stop surveillance since no intestinal metaplasia has been seen on serial EGDs 2009, 2011, 2013.  He was between prescriptions of his proton pump inhibitor and after 2 days he did start to get some pyrosis.  He has an unusual taste in the back of his mouth.  Not metallic.  Not acidic.  He has been having a lot of post nasal drip lately as well.  No difficulty swallowing.  He has very mild intermittent abdominal discomforts that do not seem out of the usual.  Certainly no lasting significant abdominal pains.  Sometimes these pains are better after he works out.  Sometimes better after  bowel movement.  ROS: complete GI ROS as described in HPI, all other review negative.  Constitutional:  No unintentional weight loss   Past Medical History:  Diagnosis Date  . A-fib ([place])   . Aortic stenosis    Mild, echo, April, 2014  . Atrial flutter ([place])    Status post ablation  . BPH (benign prostatic hyperplasia)   . CAD (coronary artery disease)    Myoview March 2014  . Carotid artery disease ([place])   . Chronic foot pain 2009   Resolved with use of orthotics  . Colonic polyp   . Diverticulosis   . Elevated bilirubin    Mild chronic elevation, 2.0 January, 2011 stable  . GERD (gastroesophageal reflux disease)    Barrett's esophagus  . Hemorrhoids    September, 2012  . HTN (hypertension)   . HX: anticoagulation   . Hyperlipidemia    Low HDL  . 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 Primary osteoarthritis of left knee    Mild  . Prolapsed internal hemorrhoids, grade 3 08/12/2015  . PVC's (premature ventricular contractions)   . Tubular adenoma of colon     Past Surgical History:  Procedure Laterality Date  . COLONOSCOPY    . CORONARY ARTERY BYPASS GRAFT  2000   CABG X5  . ESOPHAGOGASTRODUODENOSCOPY    . HEMORRHOID BANDING    . INSERT / REPLACE / REMOVE PACEMAKER  07/15/2012   initial placement  . PERMANENT PACEMAKER INSERTION N/A 07/15/2012   Procedure: PERMANENT PACEMAKER INSERTION;  Surgeon: Deboraha Sprang, MD;  Location: [facility] CATH LAB;  Service: Cardiovascular;  Laterality: N/A;  .  radiofrequency  ablation for heart arrythmia    . rotator cuff surgery Right 2017  . TONSILLECTOMY  1956    Current Outpatient Medications  Medication Sig Dispense Refill  . atorvastatin (LIPITOR) 20 MG tablet TAKE 1 TABLET ONCE DAILY. 30 tablet 11  . diclofenac sodium (VOLTAREN) 1 % GEL Use TID-QID to the left thumb 100 g 1  . ELIQUIS 5 MG TABS tablet TAKE 1 TABLET BY MOUTH TWICE DAILY. 60 tablet 0  . Multiple Vitamin (MULTIVITAMIN) tablet Take 1 tablet  by mouth daily.    . pantoprazole (PROTONIX) 40 MG tablet Take 1 tablet (40 mg total) by mouth daily. 30 tablet 1  . ramipril (ALTACE) 2.5 MG capsule TAKE (1) CAPSULE DAILY. 30 capsule 0   No current facility-administered medications for this visit.     Allergies as of 12/06/2017 - Review Complete 12/06/2017  Allergen Reaction Noted  . [facility]  10/29/2015  . Niacin and related  08/06/2016  . Sulfonamide derivatives Other (See Comments)     Family History  Problem Relation Age of Onset  . Coronary artery disease Other 70  . Diabetes Other   . Multiple myeloma Mother   . Diabetes Father   . Renal Disease Father   . Hyperlipidemia Unknown   . Hypertension Unknown   . Colon cancer Neg Hx   . Esophageal cancer Neg Hx   . Stomach cancer Neg Hx   . Rectal cancer Neg Hx     Social History   Socioeconomic History  . Marital status: Married    Spouse name: Not on file  . Number of children: 2  . Years of education: 57  . Highest education level: Not on file  Social Needs  . Financial resource strain: Not on file  . Food insecurity - worry: Not on file  . Food insecurity - inability: Not on file  . Transportation needs - medical: Not on file  . Transportation needs - non-medical: Not on file  Occupational History  . 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 Smoking status: Never Smoker  . Smokeless tobacco: Never Used  Substance and Sexual Activity  . Alcohol use: Yes    Alcohol/week: 12.6 oz    Types: 21 Glasses of wine per week  . Drug use: No  . Sexual activity: Yes    Partners: Female  Other Topics Concern  . Not on file  Social History Narrative   [employer] [place], [place].  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  2 daughters-'75, '79; 1 grandchild; step-daughter and step grandson.  Regular exercise-yes, runs 1.5-2 mi 4x/wk, also eliptical       Patient signed a Designated Party Release to allow his wife, Rishabh Rinkenberger, to have access to his medical records/ information.     Physical Exam: BP 132/80   Pulse 72   Ht _0  (1.803 m)   Wt 190 lb 2 oz (86.2 kg)   BMI 26.52 kg/m  Constitutional: generally well-appearing Psychiatric: alert and oriented x3 Abdomen: soft, nontender, nondistended, no obvious ascites, no peritoneal signs, normal bowel sounds No peripheral edema noted in lower extremities  Assessment and plan: 72 y.o. male with chronic GERD  Recommend he try 20 mg strength proton pump inhibitor to see if that is as effective at controlling his GERD symptoms as the 40 mg strength is.  If it is not he will call  and I am happy to call him in 40 mg strength.  Happy to refill the prescription 1 year from now but if he is still requiring prescription medicine in 2 years I would like to see him back in the office.  He will be due for colonoscopy polyp surveillance in 2021 and he is already in our recall system for that.  Please see the "Patient Instructions" section for addition details about the plan.  Owens Loffler, MD [facility] 12/06/2017, 3:57 PM

## 2017-12-08 NOTE — Progress Notes (Signed)
Electrophysiology Office Note Date: 12/10/2017  ID:  Russell Griffith, DOB 1946-08-23, MRN [id number]  PCP: Cassandria Anger, MD Electrophysiologist: Caryl Comes  CC: Pacemaker follow-up  Russell Griffith is a 72 y.o. male seen today for Dr Caryl Comes.  He presents today for routine electrophysiology followup.  At last visit with Dr Caryl Comes, rate response was adjusted. Since last being seen in our clinic, the patient reports doing reasonably well. He felt significantly improved following rate response adjustment.  He went to the [employer] and walked through a Clinical biochemist.  For a week after that, he felt that his energy level and exercise tolerance was decreased. He has since returned to how he felt post device reprogramming.  He has easy bleeding on Eliquis but denies chest pain, palpitations, dyspnea, PND, orthopnea, nausea, vomiting, dizziness, syncope, edema, weight gain, or early satiety.  Device History: BSX dual chamber PPM implanted 2013 for SSS   Past Medical History:  Diagnosis Date  . Aortic stenosis    Mild, echo, April, 2014  . BPH (benign prostatic hyperplasia)   . CAD (coronary artery disease)    a. s/p CABG  . Carotid artery disease ([place])   . Colonic polyp   . Diverticulosis   . Elevated bilirubin    Mild chronic elevation, 2.0 January, 2011 stable  . GERD (gastroesophageal reflux disease)    Barrett's esophagus  . HTN (hypertension)   . Hyperlipidemia    Low HDL  . 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 Persistent atrial fibrillation ([place])    a. s/p PVI at [facility]  . Primary osteoarthritis of left knee    Mild  . Prolapsed internal hemorrhoids, grade 3 08/12/2015  . PVC's (premature ventricular contractions)   . Tubular adenoma of colon    Past Surgical History:  Procedure Laterality Date  . ABLATION     [facility] at [facility]  . COLONOSCOPY    . CORONARY ARTERY BYPASS GRAFT  2000   CABG X5  . ESOPHAGOGASTRODUODENOSCOPY    .  HEMORRHOID BANDING    . PERMANENT PACEMAKER INSERTION N/A 07/15/2012   Procedure: PERMANENT PACEMAKER INSERTION;  Surgeon: Deboraha Sprang, MD;  Location: [facility] CATH LAB;  Service: Cardiovascular;  Laterality: N/A;  . rotator cuff surgery Right 2017  . TONSILLECTOMY  1956    Current Outpatient Medications  Medication Sig Dispense Refill  . atorvastatin (LIPITOR) 20 MG tablet TAKE 1 TABLET ONCE DAILY. 30 tablet 11  . diclofenac sodium (VOLTAREN) 1 % GEL Apply 2 g topically as needed (thumb soreness).    Marland Rehberg ELIQUIS 5 MG TABS tablet TAKE 1 TABLET BY MOUTH TWICE DAILY. 60 tablet 0  . Multiple Vitamin (MULTIVITAMIN) tablet Take 1 tablet by mouth daily.    . pantoprazole (PROTONIX) 20 MG tablet Take 1 tablet (20 mg total) by mouth daily. 30 tablet 11  . ramipril (ALTACE) 2.5 MG capsule TAKE (1) CAPSULE DAILY. 30 capsule 0   No current facility-administered medications for this visit.     Allergies:   Cayenne; Niacin and related; and Sulfonamide derivatives   Social History: Social History   Socioeconomic History  . Marital status: Married    Spouse name: Not on file  . Number of children: 2  . Years of education: 25  . Highest education level: Not on file  Social Needs  . Financial resource strain: Not on file  . Food insecurity - worry: Not on file  .  Food insecurity - inability: Not on file  . Transportation needs - medical: Not on file  . Transportation needs - non-medical: Not on file  Occupational History  . 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 Smoking status: Never Smoker  . Smokeless tobacco: Never Used  Substance and Sexual Activity  . Alcohol use: Yes    Alcohol/week: 12.6 oz    Types: 21 Glasses of wine per week  . Drug use: No  . Sexual activity: Yes    Partners: Female  Other Topics Concern  . Not on file  Social History Narrative   [employer] [place], [place].  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  2 daughters-'75, '79; 1 grandchild; step-daughter and step grandson.  Regular exercise-yes, runs 1.5-2 mi 4x/wk, also eliptical      Patient signed a Designated Party Release to allow his wife, Russell Griffith, to have access to his medical records/ information.    Family History: Family History  Problem Relation Age of Onset  . Coronary artery disease Other 70  . Diabetes Other   . Multiple myeloma Mother   . Diabetes Father   . Renal Disease Father   . Hyperlipidemia Unknown   . Hypertension Unknown   . Colon cancer Neg Hx   . Esophageal cancer Neg Hx   . Stomach cancer Neg Hx   . Rectal cancer Neg Hx      Review of Systems: All other systems reviewed and are otherwise negative except as noted above.   Physical Exam: VS:  BP 110/60   Pulse 87   Ht '5\' 11"'  (1.803 m)   Wt 190 lb 12.8 oz (86.5 kg)   SpO2 97%   BMI 26.61 kg/m  , BMI Body mass index is 26.61 kg/m.  GEN- The patient is well appearing, alert and oriented x 3 today.   HEENT: normocephalic, atraumatic; sclera clear, conjunctiva pink; hearing intact; oropharynx clear; neck supple  Lungs- Clear to ausculation bilaterally, normal work of breathing.  No wheezes, rales, rhonchi Heart- Regular rate and rhythm  GI- soft, non-tender, non-distended, bowel sounds present  Extremities- no clubbing, cyanosis, or edema  MS- no significant deformity or atrophy Skin- warm and dry, no rash or lesion; PPM pocket well healed Psych- euthymic mood, full affect Neuro- strength and sensation are intact  PPM Interrogation- reviewed in detail today,  See PACEART report  EKG:  EKG is not ordered today.  Recent Labs: 08/12/2017: ALT 27; BUN 23; Creatinine, Ser 1.02; Hemoglobin 14.3; Platelets 176.0; Potassium 4.1; Sodium 140; TSH 5.09   Wt Readings from Last 3 Encounters:  12/10/17 190 lb 12.8 oz (86.5 kg)  12/06/17 190 lb 2 oz (86.2 kg)  08/19/17 185 lb (83.9 kg)      Other studies Reviewed: Additional studies/ records that were reviewed today include: Dr Olin Pia office notes  Assessment and Plan:  1.  Symptomatic bradycardia Normal PPM function See Pace Art report No changes today Heart rates and exercise tolerance improved after adjustment in rate response  2.  CAD s/p CABG No recent ischemic symptoms Continue current therapy  3.  Persistent atrial fibrillation Burden by device interrogation 0% Continue eliquis for CHADS2VASC of 3 BMET, CBC stable in September 2018   Current medicines are reviewed at length with the patient today.   The patient does not have concerns regarding his medicines.  The following changes were made  today:  none  Labs/ tests ordered today include: none No orders of the defined types were placed in this encounter.    Disposition:   Follow up with [facility], Dr Caryl Comes as scheduled   Signed, Chanetta Marshall, NP 12/10/2017 8:46 AM  [place] [address] [phone] (office) [phone] (fax)

## 2017-12-09 ENCOUNTER — Encounter: Payer: Self-pay | Admitting: Nurse Practitioner

## 2017-12-10 ENCOUNTER — Encounter: Payer: Self-pay | Admitting: Nurse Practitioner

## 2017-12-10 ENCOUNTER — Ambulatory Visit: Payer: PPO | Admitting: Nurse Practitioner

## 2017-12-10 VITALS — BP 110/60 | HR 87 | Ht 71.0 in | Wt 190.8 lb

## 2017-12-10 DIAGNOSIS — I495 Sick sinus syndrome: Secondary | ICD-10-CM

## 2017-12-10 DIAGNOSIS — I481 Persistent atrial fibrillation: Secondary | ICD-10-CM

## 2017-12-10 DIAGNOSIS — I25810 Atherosclerosis of coronary artery bypass graft(s) without angina pectoris: Secondary | ICD-10-CM | POA: Diagnosis not present

## 2017-12-10 DIAGNOSIS — I4819 Other persistent atrial fibrillation: Secondary | ICD-10-CM

## 2017-12-10 NOTE — Patient Instructions (Addendum)
Medication Instructions:   Your physician recommends that you continue on your current medications as directed. Please refer to the Current Medication list given to you today.   If you need a refill on your cardiac medications before your next appointment, please call your pharmacy.  Labwork:  NONE ORDERED  TODAY    Testing/Procedures:  NONE ORDERED  TODAY    Follow-Up: Your physician wants you to follow-up in: [place] will receive a reminder letter in the mail two months in advance. If you don't receive a letter, please call our office to schedule the follow-up appointment.   Remote monitoring is used to monitor your Pacemaker of ICD from home. This monitoring reduces the number of office visits required to check your device to one time per year. It allows [place] to keep an eye on the functioning of your device to ensure it is working properly. You are scheduled for a device check from home on .1-24-19You may send your transmission at any time that day. If you have a wireless device, the transmission will be sent automatically. After your physician reviews your transmission, you will receive a postcard with your next transmission date.     Any Other Special Instructions Will Be Listed Below (If Applicable).

## 2017-12-12 ENCOUNTER — Other Ambulatory Visit: Payer: Self-pay | Admitting: Cardiology

## 2017-12-13 DIAGNOSIS — H25013 Cortical age-related cataract, bilateral: Secondary | ICD-10-CM | POA: Diagnosis not present

## 2017-12-13 DIAGNOSIS — H53001 Unspecified amblyopia, right eye: Secondary | ICD-10-CM | POA: Diagnosis not present

## 2017-12-13 DIAGNOSIS — H524 Presbyopia: Secondary | ICD-10-CM | POA: Diagnosis not present

## 2017-12-13 DIAGNOSIS — H2513 Age-related nuclear cataract, bilateral: Secondary | ICD-10-CM | POA: Diagnosis not present

## 2017-12-13 NOTE — Telephone Encounter (Signed)
Rx(s) sent to pharmacy electronically.  

## 2017-12-19 ENCOUNTER — Encounter: Payer: Self-pay | Admitting: Gastroenterology

## 2017-12-20 MED ORDER — PANTOPRAZOLE SODIUM 40 MG PO TBEC: 40.0000 mg | DELAYED_RELEASE_TABLET | Freq: Every day | ORAL | 11 refills | Status: DC

## 2017-12-22 LAB — CUP PACEART INCLINIC DEVICE CHECK
Implantable Lead Implant Date: 20130816
Implantable Lead Location: 753859
Implantable Lead Location: 753860
Implantable Lead Model: 5076
Implantable Lead Model: 5076
Implantable Pulse Generator Implant Date: 20130816
MDC IDC LEAD IMPLANT DT: 20130816
MDC IDC SESS DTM: 20190123085003
Pulse Gen Serial Number: 111255

## 2017-12-23 ENCOUNTER — Ambulatory Visit (INDEPENDENT_AMBULATORY_CARE_PROVIDER_SITE_OTHER): Payer: PPO | Admitting: *Deleted

## 2017-12-23 DIAGNOSIS — I495 Sick sinus syndrome: Secondary | ICD-10-CM

## 2017-12-23 NOTE — Progress Notes (Signed)
Remote pacemaker transmission.   

## 2017-12-24 ENCOUNTER — Encounter: Payer: Self-pay | Admitting: Cardiology

## 2017-12-29 ENCOUNTER — Telehealth (HOSPITAL_COMMUNITY): Payer: Self-pay

## 2017-12-29 NOTE — Telephone Encounter (Signed)
Encounter complete. 

## 2017-12-30 ENCOUNTER — Encounter: Payer: Self-pay | Admitting: Cardiology

## 2018-01-02 ENCOUNTER — Other Ambulatory Visit: Payer: Self-pay | Admitting: Cardiology

## 2018-01-02 DIAGNOSIS — I4892 Unspecified atrial flutter: Secondary | ICD-10-CM

## 2018-01-04 ENCOUNTER — Ambulatory Visit (HOSPITAL_COMMUNITY)
Admission: RE | Admit: 2018-01-04 | Discharge: 2018-01-04 | Disposition: A | Discharge status: Home / Self Care | Payer: PPO | Source: Ambulatory Visit | Attending: Cardiology | Admitting: Cardiology

## 2018-01-04 ENCOUNTER — Ambulatory Visit (INDEPENDENT_AMBULATORY_CARE_PROVIDER_SITE_OTHER): Payer: PPO | Admitting: Cardiology

## 2018-01-04 ENCOUNTER — Encounter: Payer: Self-pay | Admitting: Cardiovascular Disease

## 2018-01-04 ENCOUNTER — Other Ambulatory Visit: Payer: Self-pay | Admitting: *Deleted

## 2018-01-04 ENCOUNTER — Encounter: Payer: Self-pay | Admitting: Cardiology

## 2018-01-04 ENCOUNTER — Encounter (HOSPITAL_COMMUNITY): Payer: Self-pay | Admitting: *Deleted

## 2018-01-04 VITALS — BP 143/82 | HR 56

## 2018-01-04 DIAGNOSIS — Z01818 Encounter for other preprocedural examination: Secondary | ICD-10-CM

## 2018-01-04 DIAGNOSIS — R9439 Abnormal result of other cardiovascular function study: Secondary | ICD-10-CM

## 2018-01-04 DIAGNOSIS — I25810 Atherosclerosis of coronary artery bypass graft(s) without angina pectoris: Secondary | ICD-10-CM | POA: Diagnosis not present

## 2018-01-04 LAB — EXERCISE TOLERANCE TEST
CHL CUP RESTING HR STRESS: 55 {beats}/min
CHL RATE OF PERCEIVED EXERTION: 17
CSEPED: 9 min
CSEPEDS: 42 s
CSEPHR: 93 %
Estimated workload: 11.3 METS
MPHR: 151 {beats}/min
Peak HR: 141 {beats}/min

## 2018-01-04 NOTE — H&P (View-Only) (Signed)
Cardiology Office Note   Date:  01/04/2018   ID:  Russell Griffith, DOB [birth year], MRN [id number]  PCP:  Cassandria Anger, MD  Cardiologist:   No primary care provider on file.   No chief complaint on file.     History of Present Illness: Russell Griffith is a 72 y.o. male who presents for follow up of a POET (Plain Old Exercise Treadmill).  He had this today for screening of dyspnea and for evaluation of CAD with a history of CABG.    He was able to exercise for 9:36 seconds.  He had increased dyspnea with this activity more than he would typically get with exertion.  He did have palpitations during peak exercise and during recover.  I reviewed these tracings.  He did have some slight inferior ST depression at 6 minutes of exercise.  At peak exercise he had sinus tach with ventricular pacing.  However, he also had frequent runs of NSVT with one episode of a polymorphic appearance at 1:03 of recovery.     Past Medical History:  Diagnosis Date  . Aortic stenosis    Mild, echo, April, 2014  . BPH (benign prostatic hyperplasia)   . CAD (coronary artery disease)    a. s/p CABG  . Carotid artery disease ([place])   . Colonic polyp   . Diverticulosis   . Elevated bilirubin    Mild chronic elevation, 2.0 January, 2011 stable  . GERD (gastroesophageal reflux disease)    Barrett's esophagus  . HTN (hypertension)   . Hyperlipidemia    Low HDL  . 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 Persistent atrial fibrillation ([place])    a. s/p [facility]VI at [facility]  . Primary osteoarthritis of left knee    Mild  . Prolapsed internal hemorrhoids, grade 3 08/12/2015  . PVC's (premature ventricular contractions)   . Tubular adenoma of colon     Past Surgical History:  Procedure Laterality Date  . ABLATION     PVI at [facility]  . COLONOSCOPY    . CORONARY ARTERY BYPASS GRAFT  2000   CABG X5  . ESOPHAGOGASTRODUODENOSCOPY    . HEMORRHOID BANDING    . PERMANENT PACEMAKER  INSERTION N/A 07/15/2012   Procedure: PERMANENT PACEMAKER INSERTION;  Surgeon: Deboraha Sprang, MD;  Location: [facility] CATH LAB;  Service: Cardiovascular;  Laterality: N/A;  . rotator cuff surgery Right 2017  . TONSILLECTOMY  1956     Current Outpatient Medications  Medication Sig Dispense Refill  . atorvastatin (LIPITOR) 20 MG tablet TAKE 1 TABLET ONCE DAILY. (Patient taking differently: Take 20 mg by mouth daily) 30 tablet 11  . diclofenac sodium (VOLTAREN) 1 % GEL Apply 2 g topically daily as needed (thumb soreness).     Marland Dhondt ELIQUIS 5 MG TABS tablet TAKE 1 TABLET BY MOUTH TWICE DAILY. (Patient taking differently: Take 5 mg by mouth twice daily) 180 tablet 1  . Fluticasone Propionate (ALLERGY RELIEF NA) Place 2 sprays into both nostrils daily.    . Multiple Vitamin (MULTIVITAMIN) tablet Take 1 tablet by mouth daily.    . pantoprazole (PROTONIX) 40 MG tablet Take 1 tablet (40 mg total) by mouth daily. 30 tablet 11  . ramipril (ALTACE) 2.5 MG capsule Take 1 capsule (2.5 mg total) by mouth daily. 30 capsule 0   No current facility-administered medications for this visit.     Allergies:   Cayenne; Niacin and related; and Sulfonamide  derivatives    ROS:  Please see the history of present illness.   Otherwise, review of systems are positive for none.   All other systems are reviewed and negative.    PHYSICAL EXAM: VS:  There were no vitals taken for this visit. , BMI There is no height or weight on file to calculate BMI. GENERAL:  Well appearing HEENT:  Pupils equal round and reactive, fundi not visualized, oral mucosa unremarkable NECK:  No jugular venous distention, waveform within normal limits, carotid upstroke brisk and symmetric, no bruits, no thyromegaly LYMPHATICS:  No cervical, inguinal adenopathy LUNGS:  Clear to auscultation bilaterally BACK:  No CVA tenderness CHEST:  Well healed sternotomy scar.  Well healed ICD pocket.  HEART:  PMI not displaced or sustained,S1 and S2 within  normal limits, no S3, no S4, no clicks, no rubs, no murmurs ABD:  Flat, positive bowel sounds normal in frequency in pitch, no bruits, no rebound, no guarding, no midline pulsatile mass, no hepatomegaly, no splenomegaly EXT:  2 plus pulses throughout, no edema, no cyanosis no clubbing SKIN:  No rashes no nodules NEURO:  Cranial nerves II through XII grossly intact, motor grossly intact throughout PSYCH:  Cognitively intact, oriented to person place and time    EKG:  EKG is ordered today. The ekg ordered today demonstrates sinus bradycardia, axis WNL, intervals WNL.  No acute ST T wave changes.    Recent Labs: 08/12/2017: ALT 27; BUN 23; Creatinine, Ser 1.02; Hemoglobin 14.3; Platelets 176.0; Potassium 4.1; Sodium 140; TSH 5.09    Lipid Panel    Component Value Date/Time   CHOL 113 08/12/2017 0857   TRIG 54.0 08/12/2017 0857   HDL 41.70 08/12/2017 0857   CHOLHDL 3 08/12/2017 0857   VLDL 10.8 08/12/2017 0857   LDLCALC 61 08/12/2017 0857      Wt Readings from Last 3 Encounters:  12/10/17 190 lb 12.8 oz (86.5 kg)  12/06/17 190 lb 2 oz (86.2 kg)  08/19/17 185 lb (83.9 kg)      Other studies Reviewed: Additional studies/ records that were reviewed today include: POET (Plain Old Exercise Treadmill). Review of the above records demonstrates:  Please see elsewhere in the note.     ASSESSMENT AND PLAN:  CAD:  Given this abnormal stress test and SOB, cardiac cath is indicated.  The patient understands that risks included but are not limited to stroke (1 in 1000), death (1 in 48), kidney failure [usually temporary] (1 in 500), bleeding (1 in 200), allergic reaction [possibly serious] (1 in 200).  The patient understands and agrees to proceed.   He is told to hold his Eliquis tonight and tomorrow.    DYSLIPIDEMIA:  LDL is at target.  Continue current therapy.    ATRIAL FIB:  The patient had pulmonary vein isolation in the past and has a pacemaker for sinus node dysfunction.   Interestingly, Dr. Caryl Comes recently changed the MVO sensitivity to allow for increased exercise heart rate and mentioned the possibility of inducing ischemia with exercise.   I will review results of the cath and POET (Plain Old Exercise Treadmill) with Dr. Caryl Comes.    Current medicines are reviewed at length with the patient today.  The patient does not have concerns regarding medicines.  The following changes have been made:  no change  Labs/ tests ordered today include:  Cardiac cath No orders of the defined types were placed in this encounter.    Disposition:   FU with me after the  cath.     Signed, Minus Breeding, MD  01/04/2018 1:17 PM    [place]

## 2018-01-04 NOTE — Progress Notes (Signed)
Dr. Percival Spanish reviewed abnormal ETT and is meeting with patient.

## 2018-01-04 NOTE — Progress Notes (Signed)
Cardiology Office Note   Date:  01/04/2018   ID:  Russell Griffith, DOB Jul 27, 1946, MRN [id number]  PCP:  Cassandria Anger, MD  Cardiologist:   No primary care provider on file.   No chief complaint on file.     History of Present Illness: Russell Griffith is a 72 y.o. male who presents for follow up of a POET (Plain Old Exercise Treadmill).  He had this today for screening of dyspnea and for evaluation of CAD with a history of CABG.    He was able to exercise for 9:36 seconds.  He had increased dyspnea with this activity more than he would typically get with exertion.  He did have palpitations during peak exercise and during recover.  I reviewed these tracings.  He did have some slight inferior ST depression at 6 minutes of exercise.  At peak exercise he had sinus tach with ventricular pacing.  However, he also had frequent runs of NSVT with one episode of a polymorphic appearance at 1:03 of recovery.     Past Medical History:  Diagnosis Date  . Aortic stenosis    Mild, echo, April, 2014  . BPH (benign prostatic hyperplasia)   . CAD (coronary artery disease)    a. s/p CABG  . Carotid artery disease ([place])   . Colonic polyp   . Diverticulosis   . Elevated bilirubin    Mild chronic elevation, 2.0 January, 2011 stable  . GERD (gastroesophageal reflux disease)    Barrett's esophagus  . HTN (hypertension)   . Hyperlipidemia    Low HDL  . 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 Persistent atrial fibrillation ([place])    a. s/p PVI at [facility]  . Primary osteoarthritis of left knee    Mild  . Prolapsed internal hemorrhoids, grade 3 08/12/2015  . PVC's (premature ventricular contractions)   . Tubular adenoma of colon     Past Surgical History:  Procedure Laterality Date  . ABLATION     PVI at [facility]  . COLONOSCOPY    . CORONARY ARTERY BYPASS GRAFT  2000   CABG X5  . ESOPHAGOGASTRODUODENOSCOPY    . HEMORRHOID BANDING    . PERMANENT PACEMAKER  INSERTION N/A 07/15/2012   Procedure: PERMANENT PACEMAKER INSERTION;  Surgeon: Deboraha Sprang, MD;  Location: [facility] CATH LAB;  Service: Cardiovascular;  Laterality: N/A;  . rotator cuff surgery Right 2017  . TONSILLECTOMY  1956     Current Outpatient Medications  Medication Sig Dispense Refill  . atorvastatin (LIPITOR) 20 MG tablet TAKE 1 TABLET ONCE DAILY. (Patient taking differently: Take 20 mg by mouth daily) 30 tablet 11  . diclofenac sodium (VOLTAREN) 1 % GEL Apply 2 g topically daily as needed (thumb soreness).     Marland Kings ELIQUIS 5 MG TABS tablet TAKE 1 TABLET BY MOUTH TWICE DAILY. (Patient taking differently: Take 5 mg by mouth twice daily) 180 tablet 1  . Fluticasone Propionate (ALLERGY RELIEF NA) Place 2 sprays into both nostrils daily.    . Multiple Vitamin (MULTIVITAMIN) tablet Take 1 tablet by mouth daily.    . pantoprazole (PROTONIX) 40 MG tablet Take 1 tablet (40 mg total) by mouth daily. 30 tablet 11  . ramipril (ALTACE) 2.5 MG capsule Take 1 capsule (2.5 mg total) by mouth daily. 30 capsule 0   No current facility-administered medications for this visit.     Allergies:   Cayenne; Niacin and related; and Sulfonamide  derivatives    ROS:  Please see the history of present illness.   Otherwise, review of systems are positive for none.   All other systems are reviewed and negative.    PHYSICAL EXAM: VS:  There were no vitals taken for this visit. , BMI There is no height or weight on file to calculate BMI. GENERAL:  Well appearing HEENT:  Pupils equal round and reactive, fundi not visualized, oral mucosa unremarkable NECK:  No jugular venous distention, waveform within normal limits, carotid upstroke brisk and symmetric, no bruits, no thyromegaly LYMPHATICS:  No cervical, inguinal adenopathy LUNGS:  Clear to auscultation bilaterally BACK:  No CVA tenderness CHEST:  Well healed sternotomy scar.  Well healed ICD pocket.  HEART:  PMI not displaced or sustained,S1 and S2 within  normal limits, no S3, no S4, no clicks, no rubs, no murmurs ABD:  Flat, positive bowel sounds normal in frequency in pitch, no bruits, no rebound, no guarding, no midline pulsatile mass, no hepatomegaly, no splenomegaly EXT:  2 plus pulses throughout, no edema, no cyanosis no clubbing SKIN:  No rashes no nodules NEURO:  Cranial nerves II through XII grossly intact, motor grossly intact throughout PSYCH:  Cognitively intact, oriented to person place and time    EKG:  EKG is ordered today. The ekg ordered today demonstrates sinus bradycardia, axis WNL, intervals WNL.  No acute ST T wave changes.    Recent Labs: 08/12/2017: ALT 27; BUN 23; Creatinine, Ser 1.02; Hemoglobin 14.3; Platelets 176.0; Potassium 4.1; Sodium 140; TSH 5.09    Lipid Panel    Component Value Date/Time   CHOL 113 08/12/2017 0857   TRIG 54.0 08/12/2017 0857   HDL 41.70 08/12/2017 0857   CHOLHDL 3 08/12/2017 0857   VLDL 10.8 08/12/2017 0857   LDLCALC 61 08/12/2017 0857      Wt Readings from Last 3 Encounters:  12/10/17 190 lb 12.8 oz (86.5 kg)  12/06/17 190 lb 2 oz (86.2 kg)  08/19/17 185 lb (83.9 kg)      Other studies Reviewed: Additional studies/ records that were reviewed today include: POET (Plain Old Exercise Treadmill). Review of the above records demonstrates:  Please see elsewhere in the note.     ASSESSMENT AND PLAN:  CAD:  Given this abnormal stress test and SOB, cardiac cath is indicated.  The patient understands that risks included but are not limited to stroke (1 in 1000), death (1 in 47), kidney failure [usually temporary] (1 in 500), bleeding (1 in 200), allergic reaction [possibly serious] (1 in 200).  The patient understands and agrees to proceed.   He is told to hold his Eliquis tonight and tomorrow.    DYSLIPIDEMIA:  LDL is at target.  Continue current therapy.    ATRIAL FIB:  The patient had pulmonary vein isolation in the past and has a pacemaker for sinus node dysfunction.   Interestingly, Dr. Caryl Comes recently changed the MVO sensitivity to allow for increased exercise heart rate and mentioned the possibility of inducing ischemia with exercise.   I will review results of the cath and POET (Plain Old Exercise Treadmill) with Dr. Caryl Comes.    Current medicines are reviewed at length with the patient today.  The patient does not have concerns regarding medicines.  The following changes have been made:  no change  Labs/ tests ordered today include:  Cardiac cath No orders of the defined types were placed in this encounter.    Disposition:   FU with me after the  cath.     Signed, Minus Breeding, MD  01/04/2018 1:17 PM    [place]

## 2018-01-05 ENCOUNTER — Ambulatory Visit (HOSPITAL_COMMUNITY)
Admission: RE | Admit: 2018-01-05 | Discharge: 2018-01-05 | Disposition: A | Discharge status: Home / Self Care | Payer: PPO | Source: Ambulatory Visit | Attending: Cardiovascular Disease | Admitting: Cardiovascular Disease

## 2018-01-05 ENCOUNTER — Encounter (HOSPITAL_COMMUNITY): Payer: Self-pay | Admitting: *Deleted

## 2018-01-05 ENCOUNTER — Ambulatory Visit (HOSPITAL_COMMUNITY)
Admission: RE | Disposition: A | Discharge status: Home / Self Care | Payer: Self-pay | Source: Ambulatory Visit | Attending: Cardiovascular Disease

## 2018-01-05 DIAGNOSIS — K219 Gastro-esophageal reflux disease without esophagitis: Secondary | ICD-10-CM | POA: Diagnosis not present

## 2018-01-05 DIAGNOSIS — I10 Essential (primary) hypertension: Secondary | ICD-10-CM | POA: Insufficient documentation

## 2018-01-05 DIAGNOSIS — I2582 Chronic total occlusion of coronary artery: Secondary | ICD-10-CM | POA: Diagnosis not present

## 2018-01-05 DIAGNOSIS — N400 Enlarged prostate without lower urinary tract symptoms: Secondary | ICD-10-CM | POA: Insufficient documentation

## 2018-01-05 DIAGNOSIS — Z7901 Long term (current) use of anticoagulants: Secondary | ICD-10-CM | POA: Diagnosis not present

## 2018-01-05 DIAGNOSIS — R9439 Abnormal result of other cardiovascular function study: Secondary | ICD-10-CM | POA: Diagnosis present

## 2018-01-05 DIAGNOSIS — I2510 Atherosclerotic heart disease of native coronary artery without angina pectoris: Secondary | ICD-10-CM

## 2018-01-05 DIAGNOSIS — I25810 Atherosclerosis of coronary artery bypass graft(s) without angina pectoris: Secondary | ICD-10-CM | POA: Insufficient documentation

## 2018-01-05 DIAGNOSIS — J8410 Pulmonary fibrosis, unspecified: Secondary | ICD-10-CM | POA: Insufficient documentation

## 2018-01-05 DIAGNOSIS — E785 Hyperlipidemia, unspecified: Secondary | ICD-10-CM | POA: Insufficient documentation

## 2018-01-05 DIAGNOSIS — Z950 Presence of cardiac pacemaker: Secondary | ICD-10-CM | POA: Diagnosis not present

## 2018-01-05 DIAGNOSIS — Z01818 Encounter for other preprocedural examination: Secondary | ICD-10-CM

## 2018-01-05 DIAGNOSIS — Z882 Allergy status to sulfonamides status: Secondary | ICD-10-CM | POA: Insufficient documentation

## 2018-01-05 DIAGNOSIS — I481 Persistent atrial fibrillation: Secondary | ICD-10-CM | POA: Insufficient documentation

## 2018-01-05 HISTORY — PX: INTRAVASCULAR PRESSURE WIRE/FFR STUDY: CATH118243

## 2018-01-05 HISTORY — PX: LEFT HEART CATH AND CORS/GRAFTS ANGIOGRAPHY: CATH118250

## 2018-01-05 LAB — BASIC METABOLIC PANEL
ANION GAP: 11 (ref 5–15)
BUN: 20 mg/dL (ref 6–20)
CHLORIDE: 107 mmol/L (ref 101–111)
CO2: 21 mmol/L — AB (ref 22–32)
Calcium: 9.4 mg/dL (ref 8.9–10.3)
Creatinine, Ser: 0.98 mg/dL (ref 0.61–1.24)
GFR calc non Af Amer: >60 mL/min (ref >60)
GLUCOSE: 92 mg/dL (ref 65–99)
POTASSIUM: 5.2 mmol/L — AB (ref 3.5–5.1)
Sodium: 139 mmol/L (ref 135–145)

## 2018-01-05 LAB — TSH: TSH: 6.507 u[IU]/mL — ABNORMAL HIGH (ref 0.350–4.500)

## 2018-01-05 LAB — CBC
HEMATOCRIT: 44.3 % (ref 39.0–52.0)
HEMOGLOBIN: 14.7 g/dL (ref 13.0–17.0)
MCH: 29.7 pg (ref 26.0–34.0)
MCHC: 33.2 g/dL (ref 30.0–36.0)
MCV: 89.5 fL (ref 78.0–100.0)
Platelets: 150 10*3/uL (ref 150–400)
RBC: 4.95 MIL/uL (ref 4.22–5.81)
RDW: 14 % (ref 11.5–15.5)
WBC: 6.5 10*3/uL (ref 4.0–10.5)

## 2018-01-05 LAB — POCT ACTIVATED CLOTTING TIME: ACTIVATED CLOTTING TIME: 263 s

## 2018-01-05 SURGERY — LEFT HEART CATH AND CORS/GRAFTS ANGIOGRAPHY
Anesthesia: LOCAL

## 2018-01-05 MED ORDER — ASPIRIN 81 MG PO CHEW
CHEWABLE_TABLET | ORAL | Status: AC
  Administered 2018-01-05: 81 mg via ORAL
  Filled 2018-01-05: qty 1

## 2018-01-05 MED ORDER — SODIUM CHLORIDE 0.9 % IV SOLN
INTRAVENOUS | Status: DC
  Administered 2018-01-05: 10:00:00 via INTRAVENOUS

## 2018-01-05 MED ORDER — ADENOSINE 12 MG/4ML IV SOLN
INTRAVENOUS | Status: AC
  Filled 2018-01-05: qty 16

## 2018-01-05 MED ORDER — HEPARIN (PORCINE) IN NACL 2-0.9 UNIT/ML-% IJ SOLN
INTRAMUSCULAR | Status: AC | PRN
  Administered 2018-01-05: 1000 mL

## 2018-01-05 MED ORDER — MIDAZOLAM HCL 2 MG/2ML IJ SOLN
INTRAMUSCULAR | Status: AC
  Filled 2018-01-05: qty 2

## 2018-01-05 MED ORDER — NITROGLYCERIN 1 MG/10 ML FOR IR/CATH LAB
INTRA_ARTERIAL | Status: AC
  Filled 2018-01-05: qty 10

## 2018-01-05 MED ORDER — SODIUM CHLORIDE 0.9 % WEIGHT BASED INFUSION: 1.0000 mL/kg/h | INTRAVENOUS | Status: DC

## 2018-01-05 MED ORDER — SODIUM CHLORIDE 0.9% FLUSH: 3.0000 mL | INTRAVENOUS | Status: DC | PRN

## 2018-01-05 MED ORDER — MIDAZOLAM HCL 2 MG/2ML IJ SOLN
INTRAMUSCULAR | Status: DC | PRN
  Administered 2018-01-05 (×2): 2 mg via INTRAVENOUS

## 2018-01-05 MED ORDER — ADENOSINE (DIAGNOSTIC) 140MCG/KG/MIN
INTRAVENOUS | Status: DC | PRN
Start: 2018-01-05 — End: 2018-01-05
  Administered 2018-01-05: 140 ug/kg/min via INTRAVENOUS

## 2018-01-05 MED ORDER — LIDOCAINE HCL (PF) 1 % IJ SOLN
INTRAMUSCULAR | Status: AC
  Filled 2018-01-05: qty 30

## 2018-01-05 MED ORDER — ASPIRIN 81 MG PO CHEW
81.0000 mg | CHEWABLE_TABLET | ORAL | Status: AC
  Administered 2018-01-05: 81 mg via ORAL

## 2018-01-05 MED ORDER — VERAPAMIL HCL 2.5 MG/ML IV SOLN
INTRAVENOUS | Status: DC | PRN
  Administered 2018-01-05: 10 mL via INTRA_ARTERIAL

## 2018-01-05 MED ORDER — FENTANYL CITRATE (PF) 100 MCG/2ML IJ SOLN
INTRAMUSCULAR | Status: AC
  Filled 2018-01-05: qty 2

## 2018-01-05 MED ORDER — NITROGLYCERIN 1 MG/10 ML FOR IR/CATH LAB
INTRA_ARTERIAL | Status: DC | PRN
  Administered 2018-01-05: 150 ug via INTRACORONARY

## 2018-01-05 MED ORDER — HEPARIN (PORCINE) IN NACL 2-0.9 UNIT/ML-% IJ SOLN
INTRAMUSCULAR | Status: AC
  Filled 2018-01-05: qty 1000

## 2018-01-05 MED ORDER — HEPARIN SODIUM (PORCINE) 1000 UNIT/ML IJ SOLN
INTRAMUSCULAR | Status: AC
  Filled 2018-01-05: qty 1

## 2018-01-05 MED ORDER — IOHEXOL 350 MG/ML SOLN
INTRAVENOUS | Status: DC | PRN
  Administered 2018-01-05: 150 mL via INTRA_ARTERIAL

## 2018-01-05 MED ORDER — SODIUM CHLORIDE 0.9 % IV SOLN: 250.0000 mL | INTRAVENOUS | Status: DC | PRN

## 2018-01-05 MED ORDER — FENTANYL CITRATE (PF) 100 MCG/2ML IJ SOLN
INTRAMUSCULAR | Status: DC | PRN
  Administered 2018-01-05 (×2): 25 ug via INTRAVENOUS

## 2018-01-05 MED ORDER — VERAPAMIL HCL 2.5 MG/ML IV SOLN
INTRAVENOUS | Status: AC
  Filled 2018-01-05: qty 2

## 2018-01-05 MED ORDER — SODIUM CHLORIDE 0.9% FLUSH: 3.0000 mL | Freq: Two times a day (BID) | INTRAVENOUS | Status: DC

## 2018-01-05 MED ORDER — HEPARIN SODIUM (PORCINE) 1000 UNIT/ML IJ SOLN
INTRAMUSCULAR | Status: DC | PRN
  Administered 2018-01-05: 4000 [IU] via INTRAVENOUS
  Administered 2018-01-05: 5000 [IU] via INTRAVENOUS

## 2018-01-05 SURGICAL SUPPLY — 15 items
CATH EXPO 5F MPA-1 (CATHETERS) ×1 IMPLANT
CATH INFINITI 5 FR RCB (CATHETERS) ×1 IMPLANT
CATH INFINITI 5FR MULTPACK ANG (CATHETERS) ×1 IMPLANT
CATH LAUNCHER 6FR EBU 4 (CATHETERS) ×1 IMPLANT
DEVICE RAD COMP TR BAND LRG (VASCULAR PRODUCTS) ×1 IMPLANT
GLIDESHEATH SLEND SS 6F .021 (SHEATH) ×1 IMPLANT
GUIDEWIRE INQWIRE 1.5J.035X260 (WIRE) IMPLANT
GUIDEWIRE PRESSURE COMET II (WIRE) ×1 IMPLANT
INQWIRE 1.5J .035X260CM (WIRE) ×2
KIT ESSENTIALS PG (KITS) ×1 IMPLANT
KIT HEART LEFT (KITS) ×2 IMPLANT
PACK CARDIAC CATHETERIZATION (CUSTOM PROCEDURE TRAY) ×2 IMPLANT
SYR MEDRAD MARK V 150ML (SYRINGE) ×2 IMPLANT
TRANSDUCER W/STOPCOCK (MISCELLANEOUS) ×2 IMPLANT
TUBING CIL FLEX 10 FLL-RA (TUBING) ×2 IMPLANT

## 2018-01-05 NOTE — Interval H&P Note (Signed)
Cath Lab Visit (complete for each Cath Lab visit)  Clinical Evaluation Leading to the Procedure:   ACS: No.  Non-ACS:    Anginal Classification: CCS II  Anti-ischemic medical therapy: No Therapy  Non-Invasive Test Results: High-risk stress test findings: cardiac mortality >3%/year  Prior CABG: Previous CABG      History and Physical Interval Note:  01/05/2018 11:38 AM  Russell Griffith  has presented today for surgery, with the diagnosis of as - cad  The various methods of treatment have been discussed with the patient and family. After consideration of risks, benefits and other options for treatment, the patient has consented to  Procedure(s): LEFT HEART CATH AND CORONARY ANGIOGRAPHY (N/A) as a surgical intervention .  The patient's history has been reviewed, patient examined, no change in status, stable for surgery.  I have reviewed the patient's chart and labs.  Questions were answered to the patient's satisfaction.     Sherren Mocha

## 2018-01-05 NOTE — Discharge Instructions (Signed)
RESTART ELIQUIS TOMORROW 2/7  Radial Site Care Refer to this sheet in the next few weeks. These instructions provide you with information about caring for yourself after your procedure. Your health care provider may also give you more specific instructions. Your treatment has been planned according to current medical practices, but problems sometimes occur. Call your health care provider if you have any problems or questions after your procedure. What can I expect after the procedure? After your procedure, it is typical to have the following:  Bruising at the radial site that usually fades within 1-2 weeks.  Blood collecting in the tissue (hematoma) that may be painful to the touch. It should usually decrease in size and tenderness within 1-2 weeks.  Follow these instructions at home:  Take medicines only as directed by your health care provider.  You may shower 24-48 hours after the procedure or as directed by your health care provider. Remove the bandage (dressing) and gently wash the site with plain soap and water. Pat the area dry with a clean towel. Do not rub the site, because this may cause bleeding.  Do not take baths, swim, or use a hot tub until your health care provider approves.  Check your insertion site every day for redness, swelling, or drainage.  Do not apply powder or lotion to the site.  Do not flex or bend the affected arm for 24 hours or as directed by your health care provider.  Do not push or pull heavy objects with the affected arm for 24 hours or as directed by your health care provider.  Do not lift over 10 lb (4.5 kg) for 5 days after your procedure or as directed by your health care provider.  Ask your health care provider when it is okay to: ? Return to work or school. ? Resume usual physical activities or sports. ? Resume sexual activity.  Do not drive home if you are discharged the same day as the procedure. Have someone else drive you.  You may  drive 24 hours after the procedure unless otherwise instructed by your health care provider.  Do not operate machinery or power tools for 24 hours after the procedure.  If your procedure was done as an outpatient procedure, which means that you went home the same day as your procedure, a responsible adult should be with you for the first 24 hours after you arrive home.  Keep all follow-up visits as directed by your health care provider. This is important. Contact a health care provider if:  You have a fever.  You have chills.  You have increased bleeding from the radial site. Hold pressure on the site. Call 911 Get help right away if:  You have unusual pain at the radial site.  You have redness, warmth, or swelling at the radial site.  You have drainage (other than a small amount of blood on the dressing) from the radial site.  The radial site is bleeding, and the bleeding does not stop after 30 minutes of holding steady pressure on the site.  Your arm or hand becomes pale, cool, tingly, or numb. This information is not intended to replace advice given to you by your health care provider. Make sure you discuss any questions you have with your health care provider. Document Released: 12/19/2010 Document Revised: 04/23/2016 Document Reviewed: 06/04/2014 Elsevier Interactive Patient Education  2018 [employer].

## 2018-01-06 ENCOUNTER — Encounter (HOSPITAL_COMMUNITY): Payer: Self-pay | Admitting: Cardiovascular Disease

## 2018-01-06 MED FILL — Lidocaine HCl Local Preservative Free (PF) Inj 1%: INTRAMUSCULAR | Qty: 30 | Status: AC

## 2018-01-06 MED FILL — Heparin Sodium (Porcine) 2 Unit/ML in Sodium Chloride 0.9%: INTRAMUSCULAR | Qty: 1000 | Status: AC

## 2018-01-07 ENCOUNTER — Telehealth: Payer: Self-pay

## 2018-01-07 ENCOUNTER — Ambulatory Visit: Payer: PPO | Admitting: Cardiology

## 2018-01-07 NOTE — Telephone Encounter (Signed)
Pt agreeable to apt at 4:00pm 01/10/18 to have mad sensor rate adjusted down

## 2018-01-07 NOTE — Telephone Encounter (Signed)
Follow Up:; ° ° °Returning your call. °

## 2018-01-07 NOTE — Telephone Encounter (Signed)
Spoke with pt to make device clinic apt, pt in car stated he would call back when he got to a place where he could see his calendar

## 2018-01-10 ENCOUNTER — Ambulatory Visit (INDEPENDENT_AMBULATORY_CARE_PROVIDER_SITE_OTHER): Payer: PPO | Admitting: Internal Medicine

## 2018-01-10 ENCOUNTER — Encounter: Payer: Self-pay | Admitting: *Deleted

## 2018-01-10 VITALS — HR 68 | Wt 189.0 lb

## 2018-01-10 DIAGNOSIS — I472 Ventricular tachycardia, unspecified: Secondary | ICD-10-CM

## 2018-01-10 NOTE — Progress Notes (Signed)
Patient Care Team: [name], Evie Lacks, MD as PCP - General (Internal Medicine) Stefanie Libel, MD (Family Medicine) Deboraha Sprang, MD (Cardiology) Jarome Matin, MD as Consulting Physician (Dermatology) Milus Banister, MD as Attending Physician (Gastroenterology) Minus Breeding, MD as Consulting Physician (Cardiology)   HPI  Russell Griffith is a 72 y.o. male Seen in followup for atrial fibrillation for which pulmonary he underwent pulmonary vein isolation at [facility]. He is also status post pacemaker implantation for significant pausing in the context of intermittent atrial fibrillation and also significant sinus node dysfunction. this was addressed by activation rate response of his pacemaker April 2014  History of coronary artery disease with bypass surgery 2000 and a Myoview November 2013 demonstrating no ischemia or scar. 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  This was associated with significant improvement.  He was submitted for treadmill testing earlier this month which prompted a great deal of concern because of polymorphic ventricular tachycardia he underwent catheterization which demonstrated occlusion of 1 of his vein grafts but other conduits were open  He is seen today in follow-up  Past Medical History:  Diagnosis Date  . Aortic stenosis    Mild, echo, April, 2014  . BPH (benign prostatic hyperplasia)   . CAD (coronary artery disease)    a. s/p CABG  . Carotid artery disease ([place])   . Colonic polyp   . Diverticulosis   . Elevated bilirubin    Mild chronic elevation, 2.0 January, 2011 stable  . GERD (gastroesophageal reflux disease)    Barrett's esophagus  . HTN (hypertension)   . Hyperlipidemia    Low HDL  . 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 Persistent atrial fibrillation ([place])    a. s/p PVI at [facility]  . Primary osteoarthritis of left knee    Mild  . Prolapsed internal hemorrhoids, grade 3 08/12/2015  . PVC's (premature ventricular contractions)   . Tubular adenoma of colon     Past Surgical History:  Procedure Laterality Date  . ABLATION     PVI at [facility]  . COLONOSCOPY    . CORONARY ARTERY BYPASS GRAFT  2000   CABG X5  . ESOPHAGOGASTRODUODENOSCOPY    . HEMORRHOID BANDING    . INTRAVASCULAR PRESSURE WIRE/FFR STUDY N/A 01/05/2018   Procedure: INTRAVASCULAR PRESSURE WIRE/FFR STUDY;  Surgeon: Sherren Mocha, MD;  Location: [place] CV LAB;  Service: Cardiovascular;  Laterality: N/A;  . LEFT HEART CATH AND CORS/GRAFTS ANGIOGRAPHY N/A 01/05/2018   Procedure: LEFT HEART CATH AND CORS/GRAFTS ANGIOGRAPHY;  Surgeon: Sherren Mocha, MD;  Location: [place] CV LAB;  Service: Cardiovascular;  Laterality: N/A;  . PERMANENT PACEMAKER INSERTION N/A 07/15/2012   Procedure: PERMANENT PACEMAKER INSERTION;  Surgeon: Deboraha Sprang, MD;  Location: [facility] CATH LAB;  Service: Cardiovascular;  Laterality: N/A;  . rotator cuff surgery Right 2017  . TONSILLECTOMY  1956    Current Outpatient Medications  Medication Sig Dispense Refill  . atorvastatin (LIPITOR) 20 MG tablet TAKE 1 TABLET ONCE DAILY. (Patient taking differently: Take 20 mg by mouth daily) 30 tablet 11  . diclofenac sodium (VOLTAREN) 1 % GEL Apply 2 g topically daily as needed (thumb soreness).     Marland Wohlfarth ELIQUIS 5 MG TABS tablet TAKE 1 TABLET BY MOUTH TWICE DAILY. (Patient taking differently: Take 5 mg by mouth twice daily) 180 tablet 1  . Fluticasone Propionate (ALLERGY RELIEF  NA) Place 2 sprays into both nostrils daily.    . Multiple Vitamin (MULTIVITAMIN) tablet Take 1 tablet by mouth daily.    . pantoprazole (PROTONIX) 40 MG tablet Take 1 tablet (40 mg total) by mouth daily. 30 tablet 11  . ramipril (ALTACE) 2.5 MG capsule Take 1 capsule (2.5 mg total) by mouth daily. 30 capsule 0   No current facility-administered medications for this visit.     Allergies  Allergen Reactions  .  Cayenne Other (See Comments)    Sweats w/paprika too  . Niacin And Related Other (See Comments)    Upset stomach  . Sulfonamide Derivatives Other (See Comments)    "have no idea; mother told me I was allergic to"    Review of Systems negative except from HPI and PMH  Physical Exam Pulse 68   Wt 189 lb (85.7 kg)   BMI 26.36 kg/m  Well developed and nourished in no acute distress HENT normal Neck supple  No Clubbing cyanosis edema Skin-warm and dry A & Oriented  Grossly normal sensory and motor function  His exercise test was reviewed.  The device was interrogated and reviewed The findings are complicated.  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  There are issues of both atrial under sensing of intrinsic rhythm in the context of a junctional tachycardia; this is the lesser concern.  More serious concern was atrial blanking of ventricular events resulting in inappropriate ventricular pacing with R on T and polymorphic ventricular tachycardia  I have reprogrammed his device AAIR so as to mitigate the latter concern  We will arrange treadmill testing to try to understand sensing of the underlying sinus rhythm in the context of junctional tachycardia associated with exercise  More than 50% of 40 min was spent in counseling related to the above

## 2018-01-12 LAB — CUP PACEART REMOTE DEVICE CHECK
Battery Remaining Percentage: 98 %
Date Time Interrogation Session: 20190124063100
Implantable Lead Implant Date: 20130816
Implantable Lead Location: 753860
Implantable Pulse Generator Implant Date: 20130816
Lead Channel Impedance Value: 514 Ohm
Lead Channel Impedance Value: 578 Ohm
MDC IDC LEAD IMPLANT DT: 20130816
MDC IDC LEAD LOCATION: 753859
MDC IDC MSMT BATTERY REMAINING LONGEVITY: 90 mo
MDC IDC MSMT LEADCHNL RA PACING THRESHOLD AMPLITUDE: 0.8 V
MDC IDC MSMT LEADCHNL RA PACING THRESHOLD PULSEWIDTH: 0.4 ms
MDC IDC SET LEADCHNL RA PACING AMPLITUDE: 2 V
MDC IDC SET LEADCHNL RV PACING AMPLITUDE: 2.8 V
MDC IDC SET LEADCHNL RV PACING PULSEWIDTH: 0.7 ms
MDC IDC SET LEADCHNL RV SENSING SENSITIVITY: 2.5 mV
MDC IDC STAT BRADY RA PERCENT PACED: 97 %
MDC IDC STAT BRADY RV PERCENT PACED: 13 %
Pulse Gen Serial Number: 111255

## 2018-01-13 ENCOUNTER — Other Ambulatory Visit: Payer: Self-pay | Admitting: Cardiology

## 2018-01-13 NOTE — Telephone Encounter (Signed)
REFILL 

## 2018-01-24 LAB — CUP PACEART INCLINIC DEVICE CHECK
Date Time Interrogation Session: 20190211050000
Implantable Lead Implant Date: 20130816
Implantable Lead Implant Date: 20130816
Implantable Lead Location: 753860
Implantable Lead Model: 5076
Implantable Lead Model: 5076
Implantable Pulse Generator Implant Date: 20130816
MDC IDC LEAD LOCATION: 753859
MDC IDC SET LEADCHNL RA PACING AMPLITUDE: 2 V
MDC IDC SET LEADCHNL RV SENSING SENSITIVITY: 2.5 mV
Pulse Gen Serial Number: 111255

## 2018-01-25 ENCOUNTER — Ambulatory Visit (INDEPENDENT_AMBULATORY_CARE_PROVIDER_SITE_OTHER): Payer: PPO

## 2018-01-25 DIAGNOSIS — I472 Ventricular tachycardia, unspecified: Secondary | ICD-10-CM

## 2018-01-30 ENCOUNTER — Other Ambulatory Visit: Payer: Self-pay | Admitting: Cardiology

## 2018-02-23 LAB — EXERCISE TOLERANCE TEST
CHL CUP MPHR: 150 {beats}/min
CSEPED: 5 min
CSEPEDS: 2 s
CSEPPHR: 121 {beats}/min
Estimated workload: 10.9 METS
Percent HR: 80 %
RPE: 14
Rest HR: 55 {beats}/min

## 2018-03-24 ENCOUNTER — Ambulatory Visit (INDEPENDENT_AMBULATORY_CARE_PROVIDER_SITE_OTHER): Payer: PPO | Admitting: *Deleted

## 2018-03-24 DIAGNOSIS — I495 Sick sinus syndrome: Secondary | ICD-10-CM

## 2018-03-25 ENCOUNTER — Encounter: Payer: Self-pay | Admitting: Cardiology

## 2018-03-25 NOTE — Progress Notes (Signed)
Remote pacemaker transmission.   

## 2018-04-15 LAB — CUP PACEART REMOTE DEVICE CHECK
Brady Statistic RA Percent Paced: 94 %
Brady Statistic RV Percent Paced: 0 %
Implantable Lead Implant Date: 20130816
Implantable Lead Model: 5076
Lead Channel Impedance Value: 539 Ohm
Lead Channel Setting Pacing Amplitude: 2 V
Lead Channel Setting Sensing Sensitivity: 2.5 mV
MDC IDC LEAD IMPLANT DT: 20130816
MDC IDC LEAD LOCATION: 753859
MDC IDC LEAD LOCATION: 753860
MDC IDC MSMT BATTERY REMAINING LONGEVITY: 96 mo
MDC IDC MSMT BATTERY REMAINING PERCENTAGE: 100 %
MDC IDC MSMT LEADCHNL RA PACING THRESHOLD AMPLITUDE: 0.8 V
MDC IDC MSMT LEADCHNL RA PACING THRESHOLD PULSEWIDTH: 0.4 ms
MDC IDC MSMT LEADCHNL RV IMPEDANCE VALUE: 526 Ohm
MDC IDC PG IMPLANT DT: 20130816
MDC IDC SESS DTM: 20190425053100
Pulse Gen Serial Number: 111255

## 2018-05-29 ENCOUNTER — Other Ambulatory Visit: Payer: Self-pay | Admitting: Cardiology

## 2018-06-17 ENCOUNTER — Encounter: Payer: Self-pay | Admitting: Internal Medicine

## 2018-06-23 ENCOUNTER — Ambulatory Visit (INDEPENDENT_AMBULATORY_CARE_PROVIDER_SITE_OTHER): Payer: PPO | Admitting: *Deleted

## 2018-06-23 DIAGNOSIS — I472 Ventricular tachycardia, unspecified: Secondary | ICD-10-CM

## 2018-06-23 DIAGNOSIS — I495 Sick sinus syndrome: Secondary | ICD-10-CM

## 2018-06-23 NOTE — Progress Notes (Signed)
Remote pacemaker transmission.   

## 2018-06-24 ENCOUNTER — Encounter: Payer: PPO | Admitting: Internal Medicine

## 2018-07-14 ENCOUNTER — Other Ambulatory Visit: Payer: Self-pay | Admitting: Internal Medicine

## 2018-07-30 ENCOUNTER — Other Ambulatory Visit: Payer: Self-pay | Admitting: Cardiology

## 2018-07-30 DIAGNOSIS — I4892 Unspecified atrial flutter: Secondary | ICD-10-CM

## 2018-08-02 ENCOUNTER — Other Ambulatory Visit: Payer: Self-pay | Admitting: Cardiology

## 2018-08-02 DIAGNOSIS — I4892 Unspecified atrial flutter: Secondary | ICD-10-CM

## 2018-08-03 LAB — CUP PACEART REMOTE DEVICE CHECK
Brady Statistic RA Percent Paced: 89 %
Brady Statistic RV Percent Paced: 0 %
Implantable Lead Implant Date: 20130816
Implantable Lead Model: 5076
Implantable Pulse Generator Implant Date: 20130816
Lead Channel Impedance Value: 530 Ohm
Lead Channel Impedance Value: 548 Ohm
Lead Channel Setting Pacing Amplitude: 2 V
Lead Channel Setting Sensing Sensitivity: 2.5 mV
MDC IDC LEAD IMPLANT DT: 20130816
MDC IDC LEAD LOCATION: 753859
MDC IDC LEAD LOCATION: 753860
MDC IDC MSMT BATTERY REMAINING LONGEVITY: 96 mo
MDC IDC MSMT BATTERY REMAINING PERCENTAGE: 100 %
MDC IDC MSMT LEADCHNL RA PACING THRESHOLD AMPLITUDE: 0.8 V
MDC IDC MSMT LEADCHNL RA PACING THRESHOLD PULSEWIDTH: 0.4 ms
MDC IDC SESS DTM: 20190725053200
Pulse Gen Serial Number: 111255

## 2018-08-03 NOTE — Telephone Encounter (Signed)
Rx request sent to pharmacy.  

## 2018-08-14 ENCOUNTER — Other Ambulatory Visit: Payer: Self-pay | Admitting: Cardiology

## 2018-08-18 ENCOUNTER — Other Ambulatory Visit (INDEPENDENT_AMBULATORY_CARE_PROVIDER_SITE_OTHER): Payer: PPO

## 2018-08-18 ENCOUNTER — Ambulatory Visit (INDEPENDENT_AMBULATORY_CARE_PROVIDER_SITE_OTHER): Payer: PPO | Admitting: Internal Medicine

## 2018-08-18 ENCOUNTER — Encounter: Payer: Self-pay | Admitting: Internal Medicine

## 2018-08-18 VITALS — BP 130/70 | HR 68 | Temp 98.0°F | Ht 71.0 in | Wt 195.0 lb

## 2018-08-18 DIAGNOSIS — E785 Hyperlipidemia, unspecified: Secondary | ICD-10-CM | POA: Diagnosis not present

## 2018-08-18 DIAGNOSIS — Z0000 Encounter for general adult medical examination without abnormal findings: Secondary | ICD-10-CM | POA: Diagnosis not present

## 2018-08-18 DIAGNOSIS — I10 Essential (primary) hypertension: Secondary | ICD-10-CM

## 2018-08-18 DIAGNOSIS — N320 Bladder-neck obstruction: Secondary | ICD-10-CM | POA: Diagnosis not present

## 2018-08-18 DIAGNOSIS — I6529 Occlusion and stenosis of unspecified carotid artery: Secondary | ICD-10-CM

## 2018-08-18 DIAGNOSIS — I480 Paroxysmal atrial fibrillation: Secondary | ICD-10-CM | POA: Diagnosis not present

## 2018-08-18 LAB — CBC WITH DIFFERENTIAL/PLATELET
Basophils Absolute: 0 10*3/uL (ref 0.0–0.1)
Basophils Relative: 0.9 % (ref 0.0–3.0)
EOS PCT: 1.7 % (ref 0.0–5.0)
Eosinophils Absolute: 0.1 10*3/uL (ref 0.0–0.7)
HEMATOCRIT: 44.1 % (ref 39.0–52.0)
HEMOGLOBIN: 15.2 g/dL (ref 13.0–17.0)
LYMPHS PCT: 28.6 % (ref 12.0–46.0)
Lymphs Abs: 1.5 10*3/uL (ref 0.7–4.0)
MCHC: 34.6 g/dL (ref 30.0–36.0)
MCV: 86.2 fl (ref 78.0–100.0)
MONOS PCT: 17.3 % — AB (ref 3.0–12.0)
Monocytes Absolute: 0.9 10*3/uL (ref 0.1–1.0)
Neutro Abs: 2.8 10*3/uL (ref 1.4–7.7)
Neutrophils Relative %: 51.5 % (ref 43.0–77.0)
Platelets: 163 10*3/uL (ref 150.0–400.0)
RBC: 5.12 Mil/uL (ref 4.22–5.81)
RDW: 14.2 % (ref 11.5–15.5)
WBC: 5.4 10*3/uL (ref 4.0–10.5)

## 2018-08-18 LAB — HEPATIC FUNCTION PANEL
ALT: 32 U/L (ref 0–53)
AST: 29 U/L (ref 0–37)
Albumin: 4.2 g/dL (ref 3.5–5.2)
Alkaline Phosphatase: 84 U/L (ref 39–117)
BILIRUBIN DIRECT: 0.4 mg/dL — AB (ref 0.0–0.3)
TOTAL PROTEIN: 6.9 g/dL (ref 6.0–8.3)
Total Bilirubin: 2.4 mg/dL — ABNORMAL HIGH (ref 0.2–1.2)

## 2018-08-18 LAB — BASIC METABOLIC PANEL
BUN: 20 mg/dL (ref 6–23)
CO2: 29 mEq/L (ref 19–32)
CREATININE: 1.14 mg/dL (ref 0.40–1.50)
Calcium: 9.8 mg/dL (ref 8.4–10.5)
Chloride: 105 mEq/L (ref 96–112)
GFR: 67.38 mL/min (ref >60.00)
Glucose, Bld: 104 mg/dL — ABNORMAL HIGH (ref 70–99)
Potassium: 4.4 mEq/L (ref 3.5–5.1)
Sodium: 141 mEq/L (ref 135–145)

## 2018-08-18 LAB — TSH: TSH: 4.61 u[IU]/mL — ABNORMAL HIGH (ref 0.35–4.50)

## 2018-08-18 LAB — LIPID PANEL
Cholesterol: 110 mg/dL (ref 0–200)
HDL: 31.9 mg/dL — ABNORMAL LOW (ref >39.00)
LDL CALC: 68 mg/dL (ref 0–99)
NONHDL: 77.81
Total CHOL/HDL Ratio: 3
Triglycerides: 48 mg/dL (ref 0.0–149.0)
VLDL: 9.6 mg/dL (ref 0.0–40.0)

## 2018-08-18 LAB — URINALYSIS
Bilirubin Urine: NEGATIVE
Hgb urine dipstick: NEGATIVE
Ketones, ur: NEGATIVE
Leukocytes, UA: NEGATIVE
NITRITE: NEGATIVE
Specific Gravity, Urine: 1.025 (ref 1.000–1.030)
TOTAL PROTEIN, URINE-UPE24: NEGATIVE
Urine Glucose: NEGATIVE
Urobilinogen, UA: 1 (ref 0.0–1.0)
pH: 6 (ref 5.0–8.0)

## 2018-08-18 LAB — PSA: PSA: 1.24 ng/mL (ref 0.10–4.00)

## 2018-08-18 MED ORDER — SILDENAFIL CITRATE 100 MG PO TABS: 100.0000 mg | ORAL_TABLET | ORAL | 11 refills | Status: DC | PRN

## 2018-08-18 NOTE — Assessment & Plan Note (Signed)
[place] q 2 years

## 2018-08-18 NOTE — Assessment & Plan Note (Signed)
Here for medicare wellness/physical  Diet: heart healthy  Physical activity: not sedentary  Depression/mood screen: negative  Hearing: intact to whispered voice  Visual acuity: grossly normal w/glasses, performs annual eye exam  ADLs: capable  Fall risk: low to none  Home safety: good  Cognitive evaluation: intact to orientation, naming, recall and repetition  EOL planning: adv directives, full code/ I agree  I have personally reviewed and have noted  1. The patient's medical, surgical and social history  2. Their use of alcohol, tobacco or illicit drugs  3. Their current medications and supplements  4. The patient's functional ability including ADL's, fall risks, home safety risks and hearing or visual impairment.  5. Diet and physical activities  6. Evidence for depression or mood disorders 7. The roster of all physicians providing medical care to patient - is listed in the Snapshot section of the chart and reviewed today.    Today patient counseled on age appropriate routine health concerns for screening and prevention, each reviewed and up to date or declined. Immunizations reviewed and up to date or declined. Labs ordered and reviewed. Risk factors for depression reviewed and negative. Hearing function and visual acuity are intact. ADLs screened and addressed as needed. Functional ability and level of safety reviewed and appropriate. Education, counseling and referrals performed based on assessed risks today. Patient provided with a copy of personalized plan for preventive services.      

## 2018-08-18 NOTE — Progress Notes (Signed)
Subjective:  Patient ID: Russell Griffith, male    DOB: 1946-05-01  Age: 72 y.o. MRN: [id number]  CC: No chief complaint on file.   HPI Almer Bushey presents for [facility] well F/u CAD, HTN, allergies  Outpatient Medications Prior to Visit  Medication Sig Dispense Refill  . atorvastatin (LIPITOR) 20 MG tablet TAKE 1 TABLET ONCE DAILY. 30 tablet 5  . diclofenac sodium (VOLTAREN) 1 % GEL Apply 2 g topically daily as needed (thumb soreness).     Marland Kovaleski ELIQUIS 5 MG TABS tablet TAKE 1 TABLET BY MOUTH TWICE DAILY. 60 tablet 0  . Multiple Vitamin (MULTIVITAMIN) tablet Take 1 tablet by mouth daily.    . pantoprazole (PROTONIX) 40 MG tablet Take 1 tablet (40 mg total) by mouth daily. 30 tablet 11  . ramipril (ALTACE) 2.5 MG capsule TAKE (1) CAPSULE DAILY. 30 capsule 0  . Fluticasone Propionate (ALLERGY RELIEF NA) Place 2 sprays into both nostrils daily.     No facility-administered medications prior to visit.     ROS: Review of Systems  Constitutional: Negative for appetite change, fatigue and unexpected weight change.  HENT: Negative for congestion, nosebleeds, sneezing, sore throat and trouble swallowing.   Eyes: Negative for itching and visual disturbance.  Respiratory: Negative for cough.   Cardiovascular: Negative for chest pain, palpitations and leg swelling.  Gastrointestinal: Negative for abdominal distention, blood in stool, diarrhea and nausea.  Genitourinary: Negative for frequency and hematuria.  Musculoskeletal: Negative for back pain, gait problem, joint swelling and neck pain.  Skin: Negative for rash.  Neurological: Negative for dizziness, tremors, speech difficulty and weakness.  Psychiatric/Behavioral: Negative for agitation, dysphoric mood, sleep disturbance and suicidal ideas. The patient is not nervous/anxious.     Objective:  BP 130/70 (BP Location: Left Arm, Patient Position: Sitting, Cuff Size: Normal)   Pulse 68   Temp 98 F (36.7 C) (Oral)   Ht 5\' 11"   (1.803 m)   Wt 195 lb (88.5 kg)   SpO2 98%   BMI 27.20 kg/m   BP Readings from Last 3 Encounters:  08/18/18 130/70  01/05/18 119/72  01/04/18 (!) 143/82    Wt Readings from Last 3 Encounters:  08/18/18 195 lb (88.5 kg)  01/10/18 189 lb (85.7 kg)  01/05/18 190 lb (86.2 kg)    Physical Exam  Constitutional: He is oriented to person, place, and time. He appears well-developed. No distress.  NAD  HENT:  Mouth/Throat: Oropharynx is clear and moist.  Eyes: Pupils are equal, round, and reactive to light. Conjunctivae are normal.  Neck: Normal range of motion. No JVD present. No thyromegaly present.  Cardiovascular: Normal rate, regular rhythm, normal heart sounds and intact distal pulses. Exam reveals no gallop and no friction rub.  No murmur heard. Pulmonary/Chest: Effort normal and breath sounds normal. No respiratory distress. He has no wheezes. He has no rales. He exhibits no tenderness.  Abdominal: Soft. Bowel sounds are normal. He exhibits no distension and no mass. There is no tenderness. There is no rebound and no guarding.  Musculoskeletal: Normal range of motion. He exhibits no edema or tenderness.  Lymphadenopathy:    He has no cervical adenopathy.  Neurological: He is alert and oriented to person, place, and time. He has normal reflexes. No cranial nerve deficit. He exhibits normal muscle tone. He displays a negative Romberg sign. Coordination and gait normal.  Skin: Skin is warm and dry. No rash noted.  Psychiatric: He has a normal mood and affect. His behavior  is normal. Judgment and thought content normal.    Lab Results  Component Value Date   WBC 6.5 01/05/2018   HGB 14.7 01/05/2018   HCT 44.3 01/05/2018   PLT 150 01/05/2018   GLUCOSE 92 01/05/2018   CHOL 113 08/12/2017   TRIG 54.0 08/12/2017   HDL 41.70 08/12/2017   LDLCALC 61 08/12/2017   ALT 27 08/12/2017   AST 27 08/12/2017   NA 139 01/05/2018   K 5.2 (H) 01/05/2018   CL 107 01/05/2018   CREATININE  0.98 01/05/2018   BUN 20 01/05/2018   CO2 21 (L) 01/05/2018   TSH 6.507 (H) 01/05/2018   PSA 0.96 08/12/2017   INR 1.53 (H) 07/15/2012   HGBA1C 5.8 07/30/2015    No results found.  Assessment & Plan:   There are no diagnoses linked to this encounter.   No orders of the defined types were placed in this encounter.    Follow-up: No follow-ups on file.  Walker Kehr, MD

## 2018-08-18 NOTE — Assessment & Plan Note (Signed)
Eliquis 

## 2018-08-18 NOTE — Assessment & Plan Note (Signed)
Altace 

## 2018-08-19 ENCOUNTER — Other Ambulatory Visit (INDEPENDENT_AMBULATORY_CARE_PROVIDER_SITE_OTHER): Payer: PPO

## 2018-08-19 DIAGNOSIS — R946 Abnormal results of thyroid function studies: Secondary | ICD-10-CM | POA: Diagnosis not present

## 2018-08-19 LAB — T4, FREE: FREE T4: 1.03 ng/dL (ref 0.60–1.60)

## 2018-08-19 NOTE — Addendum Note (Signed)
Addended by: Karren Cobble on: 08/19/2018 09:36 AM   Modules accepted: Orders

## 2018-08-25 DIAGNOSIS — Z85828 Personal history of other malignant neoplasm of skin: Secondary | ICD-10-CM | POA: Diagnosis not present

## 2018-08-25 DIAGNOSIS — D225 Melanocytic nevi of trunk: Secondary | ICD-10-CM | POA: Diagnosis not present

## 2018-08-25 DIAGNOSIS — D2262 Melanocytic nevi of left upper limb, including shoulder: Secondary | ICD-10-CM | POA: Diagnosis not present

## 2018-08-25 DIAGNOSIS — L111 Transient acantholytic dermatosis [Grover]: Secondary | ICD-10-CM | POA: Diagnosis not present

## 2018-08-25 DIAGNOSIS — L821 Other seborrheic keratosis: Secondary | ICD-10-CM | POA: Diagnosis not present

## 2018-09-08 ENCOUNTER — Ambulatory Visit: Payer: PPO | Admitting: Internal Medicine

## 2018-09-08 ENCOUNTER — Encounter: Payer: Self-pay | Admitting: Internal Medicine

## 2018-09-08 VITALS — BP 128/72 | HR 76 | Ht 71.0 in | Wt 196.2 lb

## 2018-09-08 DIAGNOSIS — I495 Sick sinus syndrome: Secondary | ICD-10-CM | POA: Diagnosis not present

## 2018-09-08 DIAGNOSIS — I491 Atrial premature depolarization: Secondary | ICD-10-CM | POA: Diagnosis not present

## 2018-09-08 DIAGNOSIS — I480 Paroxysmal atrial fibrillation: Secondary | ICD-10-CM | POA: Diagnosis not present

## 2018-09-08 DIAGNOSIS — Z950 Presence of cardiac pacemaker: Secondary | ICD-10-CM | POA: Diagnosis not present

## 2018-09-08 DIAGNOSIS — I259 Chronic ischemic heart disease, unspecified: Secondary | ICD-10-CM

## 2018-09-08 DIAGNOSIS — I4819 Other persistent atrial fibrillation: Secondary | ICD-10-CM

## 2018-09-08 LAB — CUP PACEART INCLINIC DEVICE CHECK
Brady Statistic RA Percent Paced: 52 %
Date Time Interrogation Session: 20191010040000
Implantable Lead Implant Date: 20130816
Implantable Lead Location: 753860
Implantable Pulse Generator Implant Date: 20130816
Lead Channel Impedance Value: 569 Ohm
Lead Channel Pacing Threshold Amplitude: 1.6 V
Lead Channel Pacing Threshold Pulse Width: 0.7 ms
Lead Channel Setting Pacing Amplitude: 2 V
Lead Channel Setting Sensing Sensitivity: 2.5 mV
MDC IDC LEAD IMPLANT DT: 20130816
MDC IDC LEAD LOCATION: 753859
MDC IDC MSMT LEADCHNL RA PACING THRESHOLD AMPLITUDE: 0.8 V
MDC IDC MSMT LEADCHNL RA PACING THRESHOLD PULSEWIDTH: 0.4 ms
MDC IDC MSMT LEADCHNL RA SENSING INTR AMPL: 0.9 mV
MDC IDC MSMT LEADCHNL RV IMPEDANCE VALUE: 560 Ohm
MDC IDC MSMT LEADCHNL RV SENSING INTR AMPL: 25 mV — AB
Pulse Gen Serial Number: 111255

## 2018-09-08 NOTE — H&P (View-Only) (Signed)
Patient Care Team: [name], Evie Lacks, MD as PCP - General (Internal Medicine) Stefanie Libel, MD (Family Medicine) Deboraha Sprang, MD (Cardiology) Jarome Matin, MD as Consulting Physician (Dermatology) Milus Banister, MD as Attending Physician (Gastroenterology) Minus Breeding, MD as Consulting Physician (Cardiology)   HPI  Russell Griffith is a 72 y.o. male Seen in followup for atrial fibrillation for which pulmonary he underwent pulmonary vein isolation at [facility]. He is also status post pacemaker implantation for significant pausing in the context of intermittent atrial fibrillation and also significant sinus node dysfunction. this was addressed by activation rate response of his pacemaker April 2014  History of coronary artery disease with bypass surgery 2000 and a Myoview November 2013 demonstrating no ischemia or scar. 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  This was associated with significant improvement.  He was submitted for treadmill testing earlier this month which prompted a great deal of concern because of polymorphic ventricular tachycardia he underwent catheterization which demonstrated occlusion of 1 of his vein grafts but other conduits were open   At his last visit, his device was reprogrammed AAIR to prevent inappropriate ventricular pacing as we were seen R on T.  Since then he has done really quite well.  He has had scant palpitations.  Has noted that he can actually exercise somewhat better over the last couple of months as his heart rate is now faster.  No chest pain.  Past Medical History:  Diagnosis Date  . Aortic stenosis    Mild, echo, April, 2014  . BPH (benign prostatic hyperplasia)   . CAD (coronary artery disease)    a. s/p CABG  . Carotid artery disease ([place])   . Colonic polyp   . Diverticulosis   . Elevated bilirubin    Mild chronic elevation, 2.0 January,  2011 stable  . GERD (gastroesophageal reflux disease)    Barrett's esophagus  . HTN (hypertension)   . Hyperlipidemia    Low HDL  . 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 Persistent atrial fibrillation    a. s/p PVI at [facility]  . Primary osteoarthritis of left knee    Mild  . Prolapsed internal hemorrhoids, grade 3 08/12/2015  . PVC's (premature ventricular contractions)   . Tubular adenoma of colon     Past Surgical History:  Procedure Laterality Date  . ABLATION     PVI at [facility]  . COLONOSCOPY    . CORONARY ARTERY BYPASS GRAFT  2000   CABG X5  . ESOPHAGOGASTRODUODENOSCOPY    . HEMORRHOID BANDING    . INTRAVASCULAR PRESSURE WIRE/FFR STUDY N/A 01/05/2018   Procedure: INTRAVASCULAR PRESSURE WIRE/FFR STUDY;  Surgeon: Sherren Mocha, MD;  Location: [place] CV LAB;  Service: Cardiovascular;  Laterality: N/A;  . LEFT HEART CATH AND CORS/GRAFTS ANGIOGRAPHY N/A 01/05/2018   Procedure: LEFT HEART CATH AND CORS/GRAFTS ANGIOGRAPHY;  Surgeon: Sherren Mocha, MD;  Location: [place] CV LAB;  Service: Cardiovascular;  Laterality: N/A;  . PERMANENT PACEMAKER INSERTION N/A 07/15/2012   Procedure: PERMANENT PACEMAKER INSERTION;  Surgeon: Deboraha Sprang, MD;  Location: [facility] CATH LAB;  Service: Cardiovascular;  Laterality: N/A;  . rotator cuff surgery Right 2017  . TONSILLECTOMY  1956    Current Outpatient Medications  Medication Sig Dispense Refill  . atorvastatin (LIPITOR) 20 MG tablet Take 20 mg by mouth daily.    Marland Lupe ELIQUIS 5  MG TABS tablet TAKE 1 TABLET BY MOUTH TWICE DAILY. 60 tablet 0  . Multiple Vitamin (MULTIVITAMIN) tablet Take 1 tablet by mouth daily.    . pantoprazole (PROTONIX) 40 MG tablet Take 1 tablet (40 mg total) by mouth daily. 30 tablet 11  . ramipril (ALTACE) 2.5 MG capsule Take 2.5 mg by mouth daily.    . sildenafil (VIAGRA) 100 MG tablet Take 1 tablet (100 mg total) by mouth as needed for erectile dysfunction. 12 tablet 11   No current  facility-administered medications for this visit.     Allergies  Allergen Reactions  . Cayenne Other (See Comments)    Sweats w/paprika too  . Niacin And Related Other (See Comments)    Upset stomach  . Sulfonamide Derivatives Other (See Comments)    "have no idea; mother told me I was allergic to"    Review of Systems negative except from HPI and PMH  Physical Exam BP 128/72   Pulse 76   Ht 5\' 11"  (1.803 m)   Wt 196 lb 3.2 oz (89 kg)   SpO2 95%   BMI 27.36 kg/m  Well developed and nourished in no acute distress HENT normal Neck supple with JVP-flat Carotids brisk and full without bruits Clear Irregularly irregular rate and rhythm with controlled ventricular response, no murmurs or gallops Abd-soft with active BS without hepatomegaly No Clubbing cyanosis edema Skin-warm and dry A & Oriented  Grossly normal sensory and motor function  ECG demonstrated atrial fibrillation at 76 Interval-/zero 9/41  Assessment and  Plan  Atrial fibrillation s/p ablation persistent   PACs  Sinus node dysfunction  Ischemic heart disease with prior bypass surgery  Pacemaker  [employer]  Pacemaker function-abnormal resulting in R on T pacing   The patient is in persistent atrial fibrillation.  I have suggested that we undertake cardioversion to restore sinus rhythm, and given his young age and the results of CABANA, that it is worth trying to maintain sinus rhythm probably.  We will undertake an echocardiogram 3 or 4 weeks following cardioversion to assess left atrial dimension.  We have reviewed taking his pulse so as to be able to determine the presence or absence of atrial fibrillation.  Device interrogation suggests that about half the time since February he has been in atrial fibrillation.  It is unclear as to whether it is been persistent-long-term or intermittently.  Once we have the aforementioned data, I would back in touch with Dr. Rolland Porter at [facility] for the patient to  consider repeat consultation regarding the value of a repeat ablation procedure  We spent more than 50% of our >25 min visit in face to face counseling regarding the above

## 2018-09-08 NOTE — Patient Instructions (Signed)
Medication Instructions:  Your physician recommends that you continue on your current medications as directed. Please refer to the Current Medication list given to you today.  If you need a refill on your cardiac medications before your next appointment, please call your pharmacy.   Lab work: Your physician recommends that you return for lab work in: A few days before your procedure   If you have labs (blood work) drawn today and your tests are completely normal, you will receive your results only by: Marland Kauffmann MyChart Message (if you have MyChart) OR . A paper copy in the mail If you have any lab test that is abnormal or we need to change your treatment, we will call you to review the results.  Testing/Procedures: Your physician has recommended that you have a Cardioversion (DCCV). Electrical Cardioversion uses a jolt of electricity to your heart either through paddles or wired patches attached to your chest. This is a controlled, usually prescheduled, procedure. Defibrillation is done under light anesthesia in the hospital, and you usually go home the day of the procedure. This is done to get your heart back into a normal rhythm. You are not awake for the procedure. Please see the instruction sheet given to you today.  Your physician has requested that you have an echocardiogram. Echocardiography is a painless test that uses sound waves to create images of your heart. It provides your doctor with information about the size and shape of your heart and how well your heart's chambers and valves are working. This procedure takes approximately one hour. There are no restrictions for this procedure.   Follow-Up:  You will have an Echo 4 weeks following your cardioversion  You will follow up with Dr Caryl Comes 3 months after your cardioversion  Any Other Special Instructions Will Be Listed Below (If Applicable).

## 2018-09-08 NOTE — Progress Notes (Signed)
Patient Care Team: Russell Griffith, Russell Lacks, MD as PCP - General (Internal Medicine) Russell Libel, MD (Family Medicine) Russell Sprang, MD (Cardiology) Russell Matin, MD as Consulting Physician (Dermatology) Russell Banister, MD as Attending Physician (Gastroenterology) Russell Breeding, MD as Consulting Physician (Cardiology)   HPI  Russell Griffith is a 72 y.o. male Seen in followup for atrial fibrillation for which pulmonary he underwent pulmonary vein isolation at [facility]. He is also status post pacemaker implantation for significant pausing in the context of intermittent atrial fibrillation and also significant sinus node dysfunction. this was addressed by activation rate response of his pacemaker April 2014  History of coronary artery disease with bypass surgery 2000 and a Myoview November 2013 demonstrating no ischemia or scar. 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  This was associated with significant improvement.  He was submitted for treadmill testing earlier this month which prompted a great deal of concern because of polymorphic ventricular tachycardia he underwent catheterization which demonstrated occlusion of 1 of his vein grafts but other conduits were open   At his last visit, his device was reprogrammed AAIR to prevent inappropriate ventricular pacing as we were seen R on T.  Since then he has done really quite well.  He has had scant palpitations.  Has noted that he can actually exercise somewhat better over the last couple of months as his heart rate is now faster.  No chest pain.  Past Medical History:  Diagnosis Date  . Aortic stenosis    Mild, echo, April, 2014  . BPH (benign prostatic hyperplasia)   . CAD (coronary artery disease)    a. s/p CABG  . Carotid artery disease ([place])   . Colonic polyp   . Diverticulosis   . Elevated bilirubin    Mild chronic elevation, 2.0 January,  2011 stable  . GERD (gastroesophageal reflux disease)    Barrett's esophagus  . HTN (hypertension)   . Hyperlipidemia    Low HDL  . 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 Persistent atrial fibrillation    a. s[facility]p [facility]VI at [facility]  . Primary osteoarthritis of left knee    Mild  . Prolapsed internal hemorrhoids, grade 3 08/12/2015  . PVC's (premature ventricular contractions)   . Tubular adenoma of colon     Past Surgical History:  Procedure Laterality Date  . ABLATION     PVI at [facility]  . COLONOSCOPY    . CORONARY ARTERY BYPASS GRAFT  2000   CABG X5  . ESOPHAGOGASTRODUODENOSCOPY    . HEMORRHOID BANDING    . INTRAVASCULAR PRESSURE WIRE/FFR STUDY N/A 01/05/2018   Procedure: INTRAVASCULAR PRESSURE WIRE/FFR STUDY;  Surgeon: Sherren Mocha, MD;  Location: [place] CV LAB;  Service: Cardiovascular;  Laterality: N/A;  . LEFT HEART CATH AND CORS/GRAFTS ANGIOGRAPHY N/A 01/05/2018   Procedure: LEFT HEART CATH AND CORS/GRAFTS ANGIOGRAPHY;  Surgeon: Sherren Mocha, MD;  Location: [place] CV LAB;  Service: Cardiovascular;  Laterality: N/A;  . PERMANENT PACEMAKER INSERTION N/A 07/15/2012   Procedure: PERMANENT PACEMAKER INSERTION;  Surgeon: Russell Sprang, MD;  Location: [facility] CATH LAB;  Service: Cardiovascular;  Laterality: N/A;  . rotator cuff surgery Right 2017  . TONSILLECTOMY  1956    Current Outpatient Medications  Medication Sig Dispense Refill  . atorvastatin (LIPITOR) 20 MG tablet Take 20 mg by mouth daily.    [name] ELIQUIS 5  MG TABS tablet TAKE 1 TABLET BY MOUTH TWICE DAILY. 60 tablet 0  . Multiple Vitamin (MULTIVITAMIN) tablet Take 1 tablet by mouth daily.    . pantoprazole (PROTONIX) 40 MG tablet Take 1 tablet (40 mg total) by mouth daily. 30 tablet 11  . ramipril (ALTACE) 2.5 MG capsule Take 2.5 mg by mouth daily.    . sildenafil (VIAGRA) 100 MG tablet Take 1 tablet (100 mg total) by mouth as needed for erectile dysfunction. 12 tablet 11   No current  facility-administered medications for this visit.     Allergies  Allergen Reactions  . Cayenne Other (See Comments)    Sweats w/paprika too  . Niacin And Related Other (See Comments)    Upset stomach  . Sulfonamide Derivatives Other (See Comments)    "have no idea; mother told me I was allergic to"    Review of Systems negative except from HPI and PMH  Physical Exam BP 128/72   Pulse 76   Ht 5\' 11"  (1.803 m)   Wt 196 lb 3.2 oz (89 kg)   SpO2 95%   BMI 27.36 kg/m  Well developed and nourished in no acute distress HENT normal Neck supple with JVP-flat Carotids brisk and full without bruits Clear Irregularly irregular rate and rhythm with controlled ventricular response, no murmurs or gallops Abd-soft with active BS without hepatomegaly No Clubbing cyanosis edema Skin-warm and dry A & Oriented  Grossly normal sensory and motor function  ECG demonstrated atrial fibrillation at 76 Interval-/zero 9/41  Assessment and  Plan  Atrial fibrillation s/p ablation persistent   PACs  Sinus node dysfunction  Ischemic heart disease with prior bypass surgery  Pacemaker  [employer]  Pacemaker function-abnormal resulting in R on T pacing   The patient is in persistent atrial fibrillation.  I have suggested that we undertake cardioversion to restore sinus rhythm, and given his young age and the results of CABANA, that it is worth trying to maintain sinus rhythm probably.  We will undertake an echocardiogram 3 or 4 weeks following cardioversion to assess left atrial dimension.  We have reviewed taking his pulse so as to be able to determine the presence or absence of atrial fibrillation.  Device interrogation suggests that about half the time since February he has been in atrial fibrillation.  It is unclear as to whether it is been persistent-long-term or intermittently.  Once we have the aforementioned data, I would back in touch with Dr. Rolland Porter at [facility] for the patient to  consider repeat consultation regarding the value of a repeat ablation procedure  We spent more than 50% of our >25 min visit in face to face counseling regarding the above

## 2018-09-09 DIAGNOSIS — I4819 Other persistent atrial fibrillation: Secondary | ICD-10-CM

## 2018-09-09 NOTE — Telephone Encounter (Signed)
Called pt and set up DCCV, Echo, and follow up with Dr Caryl Comes. Pre procedural instructions were given verbally to pt. A copy of instructions were sent to pt via MyChart. Pt has verbalized understanding of all and had no additional questions.

## 2018-09-11 ENCOUNTER — Other Ambulatory Visit: Payer: Self-pay | Admitting: Cardiology

## 2018-09-11 DIAGNOSIS — I4892 Unspecified atrial flutter: Secondary | ICD-10-CM

## 2018-09-12 ENCOUNTER — Ambulatory Visit (HOSPITAL_COMMUNITY)
Admission: RE | Admit: 2018-09-12 | Discharge: 2018-09-12 | Disposition: A | Discharge status: Home / Self Care | Payer: PPO | Source: Ambulatory Visit | Attending: Cardiovascular Disease | Admitting: Cardiovascular Disease

## 2018-09-12 DIAGNOSIS — I6529 Occlusion and stenosis of unspecified carotid artery: Secondary | ICD-10-CM | POA: Diagnosis not present

## 2018-09-21 ENCOUNTER — Other Ambulatory Visit: Payer: PPO | Admitting: *Deleted

## 2018-09-21 DIAGNOSIS — I4819 Other persistent atrial fibrillation: Secondary | ICD-10-CM | POA: Diagnosis not present

## 2018-09-21 LAB — CBC
Hematocrit: 42.5 % (ref 37.5–51.0)
Hemoglobin: 14.8 g/dL (ref 13.0–17.7)
MCH: 29.7 pg (ref 26.6–33.0)
MCHC: 34.8 g/dL (ref 31.5–35.7)
MCV: 85 fL (ref 79–97)
PLATELETS: 178 10*3/uL (ref 150–450)
RBC: 4.99 x10E6/uL (ref 4.14–5.80)
RDW: 13.9 % (ref 12.3–15.4)
WBC: 6.3 10*3/uL (ref 3.4–10.8)

## 2018-09-21 LAB — BASIC METABOLIC PANEL
BUN/Creatinine Ratio: 14 (ref 10–24)
BUN: 16 mg/dL (ref 8–27)
CO2: 26 mmol/L (ref 20–29)
CREATININE: 1.12 mg/dL (ref 0.76–1.27)
Calcium: 9.7 mg/dL (ref 8.6–10.2)
Chloride: 102 mmol/L (ref 96–106)
GFR calc Af Amer: 77 mL/min/{1.73_m2} (ref >59)
GFR calc non Af Amer: 66 mL/min/{1.73_m2} (ref >59)
GLUCOSE: 89 mg/dL (ref 65–99)
POTASSIUM: 4.6 mmol/L (ref 3.5–5.2)
Sodium: 142 mmol/L (ref 134–144)

## 2018-09-22 ENCOUNTER — Ambulatory Visit (INDEPENDENT_AMBULATORY_CARE_PROVIDER_SITE_OTHER): Payer: PPO | Admitting: *Deleted

## 2018-09-22 DIAGNOSIS — I495 Sick sinus syndrome: Secondary | ICD-10-CM | POA: Diagnosis not present

## 2018-09-22 NOTE — Progress Notes (Signed)
Remote pacemaker transmission.   

## 2018-09-23 ENCOUNTER — Ambulatory Visit (HOSPITAL_COMMUNITY)
Admission: RE | Admit: 2018-09-23 | Discharge: 2018-09-23 | Disposition: A | Discharge status: Home / Self Care | Payer: PPO | Source: Ambulatory Visit | Attending: Internal Medicine | Admitting: Internal Medicine

## 2018-09-23 ENCOUNTER — Telehealth: Payer: Self-pay | Admitting: Internal Medicine

## 2018-09-23 ENCOUNTER — Encounter (HOSPITAL_COMMUNITY)
Admission: RE | Disposition: A | Discharge status: Home / Self Care | Payer: Self-pay | Source: Ambulatory Visit | Attending: Internal Medicine

## 2018-09-23 ENCOUNTER — Encounter (HOSPITAL_COMMUNITY): Payer: Self-pay | Admitting: *Deleted

## 2018-09-23 ENCOUNTER — Encounter (HOSPITAL_COMMUNITY): Payer: Self-pay | Admitting: Certified Registered Nurse Anesthetist

## 2018-09-23 DIAGNOSIS — Z882 Allergy status to sulfonamides status: Secondary | ICD-10-CM | POA: Diagnosis not present

## 2018-09-23 DIAGNOSIS — E785 Hyperlipidemia, unspecified: Secondary | ICD-10-CM | POA: Diagnosis not present

## 2018-09-23 DIAGNOSIS — I4819 Other persistent atrial fibrillation: Secondary | ICD-10-CM | POA: Diagnosis not present

## 2018-09-23 DIAGNOSIS — Z950 Presence of cardiac pacemaker: Secondary | ICD-10-CM | POA: Diagnosis not present

## 2018-09-23 DIAGNOSIS — Z5309 Procedure and treatment not carried out because of other contraindication: Secondary | ICD-10-CM | POA: Insufficient documentation

## 2018-09-23 DIAGNOSIS — I350 Nonrheumatic aortic (valve) stenosis: Secondary | ICD-10-CM | POA: Diagnosis not present

## 2018-09-23 DIAGNOSIS — Z951 Presence of aortocoronary bypass graft: Secondary | ICD-10-CM | POA: Insufficient documentation

## 2018-09-23 DIAGNOSIS — J8410 Pulmonary fibrosis, unspecified: Secondary | ICD-10-CM | POA: Diagnosis not present

## 2018-09-23 DIAGNOSIS — I495 Sick sinus syndrome: Secondary | ICD-10-CM | POA: Diagnosis not present

## 2018-09-23 DIAGNOSIS — K219 Gastro-esophageal reflux disease without esophagitis: Secondary | ICD-10-CM | POA: Diagnosis not present

## 2018-09-23 DIAGNOSIS — N400 Enlarged prostate without lower urinary tract symptoms: Secondary | ICD-10-CM | POA: Diagnosis not present

## 2018-09-23 DIAGNOSIS — I10 Essential (primary) hypertension: Secondary | ICD-10-CM | POA: Diagnosis not present

## 2018-09-23 DIAGNOSIS — I2510 Atherosclerotic heart disease of native coronary artery without angina pectoris: Secondary | ICD-10-CM | POA: Insufficient documentation

## 2018-09-23 SURGERY — CANCELLED PROCEDURE

## 2018-09-23 MED ORDER — SODIUM CHLORIDE 0.9 % IV SOLN: INTRAVENOUS | Status: DC

## 2018-09-23 MED ORDER — RIVAROXABAN 20 MG PO TABS: 20.0000 mg | ORAL_TABLET | Freq: Every day | ORAL | 3 refills | Status: DC

## 2018-09-23 NOTE — Telephone Encounter (Signed)
Left patient a voicemail to call back

## 2018-09-23 NOTE — Anesthesia Preprocedure Evaluation (Deleted)
Anesthesia Evaluation    Reviewed: Allergy & Precautions, Patient's Chart, lab work & pertinent test results  Airway        Dental   Pulmonary neg pulmonary ROS,           Cardiovascular hypertension, Pt. on medications + CAD and + CABG  + dysrhythmias Atrial Fibrillation + Valvular Problems/Murmurs AS      Neuro/Psych Depression negative neurological ROS     GI/Hepatic Neg liver ROS, GERD  Medicated,  Endo/Other  negative endocrine ROS  Renal/GU negative Renal ROS     Musculoskeletal  (+) Arthritis , Osteoarthritis,    Abdominal   Peds  Hematology   Anesthesia Other Findings - HLD  Reproductive/Obstetrics                             Anesthesia Physical Anesthesia Plan  ASA: III  Anesthesia Plan: General   Post-op Pain Management:    Induction: Intravenous  PONV Risk Score and Plan: Propofol infusion  Airway Management Planned: Simple Face Mask and Natural Airway  Additional Equipment: None  Intra-op Plan:   Post-operative Plan:   Informed Consent:   Plan Discussed with: CRNA  Anesthesia Plan Comments:         Anesthesia Quick Evaluation

## 2018-09-23 NOTE — Interval H&P Note (Signed)
History and Physical Interval Note:  09/23/2018 2:37 PM  Russell Griffith  has presented today for surgery, with the diagnosis of AFIB  The various methods of treatment have been discussed with the patient and family. After consideration of risks, benefits and other options for treatment, the patient has consented to  Procedure(s): CANCELLED PROCEDURE as a surgical intervention .  The patient's history has been reviewed, patient examined, no change in status, stable for surgery.  I have reviewed the patient's chart and labs.  Questions were answered to the patient's satisfaction.     Dorris Carnes

## 2018-09-23 NOTE — Addendum Note (Signed)
Addended by: Sarina Ill on: 09/23/2018 01:40 PM   Modules accepted: Orders

## 2018-09-23 NOTE — Progress Notes (Signed)
Patient admitted to Endoscopy for cardioversion. Patient missed eliquis dose yesterday AM. MD made aware. Patient and MD made decision to reschedule case for 11.15.19 @ 0900. Patient case cancelled prior to entering procedure room.

## 2018-09-23 NOTE — Telephone Encounter (Signed)
Patient presented earlier today for cardioversion .  Postponed to November 15. Currently on Eliquis bid   Because of missed doses plan to switch to Xarelto 20 mg Will have nursing call in Xarelto 20 mg daily   12 months reflll to [place] Patient will start tomorrow and stop Eliquis    DIscussed with Olin Pia who is in agreement.

## 2018-09-23 NOTE — Telephone Encounter (Signed)
Spoke with the patient, he agreed to stop Eliquis and Zarelto 20 mg was ordered. He expressed understanding and had no further questions.

## 2018-10-07 ENCOUNTER — Telehealth: Payer: Self-pay | Admitting: Internal Medicine

## 2018-10-07 ENCOUNTER — Telehealth: Payer: Self-pay

## 2018-10-07 MED ORDER — APIXABAN 5 MG PO TABS: 5.0000 mg | ORAL_TABLET | Freq: Two times a day (BID) | ORAL | 3 refills | Status: DC

## 2018-10-07 NOTE — Telephone Encounter (Signed)
lpmtcb 1/18 

## 2018-10-07 NOTE — Telephone Encounter (Signed)
I saw pt's recent email   Has not felt well since starting xarelto Would swithc back to Eliquis BID   Start the following day after taking Xarelto    Keep on this   Calll neext week with how he feels

## 2018-10-07 NOTE — Telephone Encounter (Signed)
Spoke to patient and informed him of Dr Harrington Challenger' recommendation to STOP Xarelto and Start Eliquis 5 mg bid.

## 2018-10-09 ENCOUNTER — Other Ambulatory Visit: Payer: Self-pay | Admitting: Cardiology

## 2018-10-10 NOTE — Telephone Encounter (Signed)
Frederik Schmidt, RN  10/07/18 9:57 AM  Note    Spoke to patient and informed him of Dr Harrington Challenger' recommendation to STOP Xarelto and Start Eliquis 5 mg bid.

## 2018-10-13 NOTE — Anesthesia Preprocedure Evaluation (Addendum)
Anesthesia Evaluation  Patient identified by MRN, date of birth, ID band Patient awake    Reviewed: Allergy & Precautions, NPO status , Patient's Chart, lab work & pertinent test results  History of Anesthesia Complications Negative for: history of anesthetic complications  Airway Mallampati: II  TM Distance: >3 FB Neck ROM: Full    Dental no notable dental hx.    Pulmonary neg pulmonary ROS,    Pulmonary exam normal        Cardiovascular hypertension, Pt. on medications + CAD and + CABG  + dysrhythmias Atrial Fibrillation + Valvular Problems/Murmurs AS  Rhythm:Irregular  TTE 2014:  - Left ventricle: Septal hypokinesis The cavity size was mildly dilated. Systolic function was normal. The estimated ejection fraction was in the range of 50% to 55%. - Aortic valve: Small gradient across calcified AV - Mitral valve: Mild regurgitation. - Left atrium: The atrium was moderately dilated. - Right atrium: The atrium was moderately dilated. - Tricuspid valve: Mild-moderate regurgitation.   Neuro/Psych Carotid stenosis; Left 40-59% negative psych ROS   GI/Hepatic Neg liver ROS, GERD  ,  Endo/Other  negative endocrine ROS  Renal/GU negative Renal ROS  negative genitourinary   Musculoskeletal negative musculoskeletal ROS (+)   Abdominal   Peds  Hematology negative hematology ROS (+)   Anesthesia Other Findings   Reproductive/Obstetrics                            Anesthesia Physical Anesthesia Plan  ASA: III  Anesthesia Plan: General   Post-op Pain Management:    Induction: Intravenous  PONV Risk Score and Plan: 2 and TIVA and Treatment may vary due to age or medical condition  Airway Management Planned: Mask  Additional Equipment: None  Intra-op Plan:   Post-operative Plan:   Informed Consent: I have reviewed the patients History and Physical, chart, labs and discussed the  procedure including the risks, benefits and alternatives for the proposed anesthesia with the patient or authorized representative who has indicated his/her understanding and acceptance.     Plan Discussed with:   Anesthesia Plan Comments:        Anesthesia Quick Evaluation

## 2018-10-14 ENCOUNTER — Ambulatory Visit (HOSPITAL_COMMUNITY)
Admission: RE | Admit: 2018-10-14 | Discharge: 2018-10-14 | Disposition: A | Discharge status: Home / Self Care | Payer: PPO | Source: Ambulatory Visit | Attending: Internal Medicine | Admitting: Internal Medicine

## 2018-10-14 ENCOUNTER — Encounter: Payer: Self-pay | Admitting: Internal Medicine

## 2018-10-14 ENCOUNTER — Ambulatory Visit (HOSPITAL_COMMUNITY): Payer: PPO | Admitting: Anesthesiology

## 2018-10-14 ENCOUNTER — Encounter (HOSPITAL_COMMUNITY): Payer: Self-pay | Admitting: Certified Registered"

## 2018-10-14 ENCOUNTER — Telehealth: Payer: Self-pay | Admitting: Internal Medicine

## 2018-10-14 ENCOUNTER — Encounter (HOSPITAL_COMMUNITY)
Admission: RE | Disposition: A | Discharge status: Home / Self Care | Payer: Self-pay | Source: Ambulatory Visit | Attending: Internal Medicine

## 2018-10-14 ENCOUNTER — Other Ambulatory Visit: Payer: Self-pay

## 2018-10-14 DIAGNOSIS — J8410 Pulmonary fibrosis, unspecified: Secondary | ICD-10-CM | POA: Insufficient documentation

## 2018-10-14 DIAGNOSIS — I4891 Unspecified atrial fibrillation: Secondary | ICD-10-CM | POA: Diagnosis not present

## 2018-10-14 DIAGNOSIS — I083 Combined rheumatic disorders of mitral, aortic and tricuspid valves: Secondary | ICD-10-CM | POA: Diagnosis not present

## 2018-10-14 DIAGNOSIS — Z950 Presence of cardiac pacemaker: Secondary | ICD-10-CM | POA: Insufficient documentation

## 2018-10-14 DIAGNOSIS — N400 Enlarged prostate without lower urinary tract symptoms: Secondary | ICD-10-CM | POA: Insufficient documentation

## 2018-10-14 DIAGNOSIS — I350 Nonrheumatic aortic (valve) stenosis: Secondary | ICD-10-CM | POA: Insufficient documentation

## 2018-10-14 DIAGNOSIS — I2510 Atherosclerotic heart disease of native coronary artery without angina pectoris: Secondary | ICD-10-CM | POA: Insufficient documentation

## 2018-10-14 DIAGNOSIS — I4819 Other persistent atrial fibrillation: Secondary | ICD-10-CM | POA: Insufficient documentation

## 2018-10-14 DIAGNOSIS — I10 Essential (primary) hypertension: Secondary | ICD-10-CM | POA: Diagnosis not present

## 2018-10-14 DIAGNOSIS — K219 Gastro-esophageal reflux disease without esophagitis: Secondary | ICD-10-CM | POA: Insufficient documentation

## 2018-10-14 DIAGNOSIS — Z7901 Long term (current) use of anticoagulants: Secondary | ICD-10-CM | POA: Insufficient documentation

## 2018-10-14 DIAGNOSIS — Z882 Allergy status to sulfonamides status: Secondary | ICD-10-CM | POA: Insufficient documentation

## 2018-10-14 DIAGNOSIS — E785 Hyperlipidemia, unspecified: Secondary | ICD-10-CM | POA: Diagnosis not present

## 2018-10-14 DIAGNOSIS — Z951 Presence of aortocoronary bypass graft: Secondary | ICD-10-CM | POA: Insufficient documentation

## 2018-10-14 DIAGNOSIS — I6522 Occlusion and stenosis of left carotid artery: Secondary | ICD-10-CM | POA: Diagnosis not present

## 2018-10-14 HISTORY — PX: CARDIOVERSION: SHX1299

## 2018-10-14 SURGERY — CARDIOVERSION
Anesthesia: General

## 2018-10-14 MED ORDER — SODIUM CHLORIDE 0.9 % IV SOLN
INTRAVENOUS | Status: DC
  Administered 2018-10-14: 500 mL via INTRAVENOUS

## 2018-10-14 MED ORDER — PROPOFOL 10 MG/ML IV BOLUS
INTRAVENOUS | Status: DC | PRN
  Administered 2018-10-14: 80 mg via INTRAVENOUS

## 2018-10-14 MED ORDER — LIDOCAINE 2% (20 MG/ML) 5 ML SYRINGE
INTRAMUSCULAR | Status: DC | PRN
  Administered 2018-10-14: 100 mg via INTRAVENOUS

## 2018-10-14 NOTE — Telephone Encounter (Signed)
New Message:   Patient returning call. Please call back.

## 2018-10-14 NOTE — Anesthesia Procedure Notes (Signed)
Procedure Name: General with mask airway Date/Time: 10/14/2018 8:40 AM Performed by: Imagene Riches, CRNA Pre-anesthesia Checklist: Patient identified, Emergency Drugs available, Suction available and Patient being monitored Patient Re-evaluated:Patient Re-evaluated prior to induction Oxygen Delivery Method: Ambu bag Preoxygenation: Pre-oxygenation with 100% oxygen Induction Type: IV induction

## 2018-10-14 NOTE — Transfer of Care (Signed)
Immediate Anesthesia Transfer of Care Note  Patient: Russell Griffith  Procedure(s) Performed: CARDIOVERSION (N/A )  Patient Location: Endoscopy Unit  Anesthesia Type:General  Level of Consciousness: drowsy  Airway & Oxygen Therapy: Patient Spontanous Breathing  Post-op Assessment: Report given to RN and Post -op Vital signs reviewed and stable  Post vital signs: Reviewed and stable  Last Vitals:  Vitals Value Taken Time  BP 151/53 10/14/2018  8:45 AM  Temp    Pulse    Resp 20 10/14/2018  8:45 AM  SpO2 95 % 10/14/2018  8:45 AM    Last Pain:  Vitals:   10/14/18 0845  TempSrc:   PainSc: 0-No pain         Complications: No apparent anesthesia complications

## 2018-10-14 NOTE — Anesthesia Postprocedure Evaluation (Signed)
Anesthesia Post Note  Patient: Russell Griffith  Procedure(s) Performed: CARDIOVERSION (N/A )     Patient location during evaluation: PACU Anesthesia Type: General Level of consciousness: awake and alert Pain management: pain level controlled Vital Signs Assessment: post-procedure vital signs reviewed and stable Respiratory status: spontaneous breathing, nonlabored ventilation and respiratory function stable Cardiovascular status: blood pressure returned to baseline and stable Postop Assessment: no apparent nausea or vomiting Anesthetic complications: no    Last Vitals:  Vitals:   10/14/18 0849 10/14/18 0859  BP: 137/75 137/64  Pulse: 74 (!) 54  Resp: (!) 22 14  Temp: 36.5 C   SpO2: 95% 96%    Last Pain:  Vitals:   10/14/18 0859  TempSrc:   PainSc: 0-No pain                 Lidia Collum

## 2018-10-14 NOTE — Discharge Instructions (Signed)
Electrical Cardioversion, Care After °This sheet gives you information about how to care for yourself after your procedure. Your health care provider may also give you more specific instructions. If you have problems or questions, contact your health care provider. °What can I expect after the procedure? °After the procedure, it is common to have: °· Some redness on the skin where the shocks were given. ° °Follow these instructions at home: °· Do not drive for 24 hours if you were given a medicine to help you relax (sedative). °· Take over-the-counter and prescription medicines only as told by your health care provider. °· Ask your health care provider how to check your pulse. Check it often. °· Rest for 48 hours after the procedure or as told by your health care provider. °· Avoid or limit your caffeine use as told by your health care provider. °Contact a health care provider if: °· You feel like your heart is beating too quickly or your pulse is not regular. °· You have a serious muscle cramp that does not go away. °Get help right away if: °· You have discomfort in your chest. °· You are dizzy or you feel faint. °· You have trouble breathing or you are short of breath. °· Your speech is slurred. °· You have trouble moving an arm or leg on one side of your body. °· Your fingers or toes turn cold or blue. °This information is not intended to replace advice given to you by your health care provider. Make sure you discuss any questions you have with your health care provider. °Document Released: 09/06/2013 Document Revised: 06/19/2016 Document Reviewed: 05/22/2016 °Elsevier Interactive Patient Education © 2018 [employer] ° °

## 2018-10-14 NOTE — Telephone Encounter (Signed)
Patient just had cardioversion today It was postponed from mid October  He is scheduled for echo the week after next.    This should be rescheduled to be done 3 to 4 wks from cardioversion  Pt says he had appt with Olin Pia after echo    I do not see this    Please see if he can be put on schedule to see him after echo.  Call with problems  Dorris Carnes

## 2018-10-14 NOTE — Telephone Encounter (Signed)
Left message for patient to call back  

## 2018-10-14 NOTE — H&P (Signed)
Cardiology Admission History and Physical:   Patient ID: Russell Griffith MRN: [id number]; DOB: [birth year]   Admission date: 10/14/2018  Primary Care Provider: Cassandria Anger, MD  Primary Electrophysiologist:  Caryl Comes  History of Present Illness:   Russell Griffith is a y49 yo with istory of atrial fibrillation (s/p ablation at [facility]), PPM , CAD  (s/p CABG in 2000; last cath in Feb 2019)   He presents for DC cardioversion   He was here a few wks ago and admitted to some missed doses of Eliquis   Switched to Xarelto    Has not missed any doses in interval     Past Medical History:  Diagnosis Date  . Aortic stenosis    Mild, echo, April, 2014  . BPH (benign prostatic hyperplasia)   . CAD (coronary artery disease)    a. s/p CABG  . Carotid artery disease ([place])   . Colonic polyp   . Diverticulosis   . Elevated bilirubin    Mild chronic elevation, 2.0 January, 2011 stable  . GERD (gastroesophageal reflux disease)    Barrett's esophagus  . HTN (hypertension)   . Hyperlipidemia    Low HDL  . 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)    Left  lung chest x-ray July, 2013  . Persistent atrial fibrillation    a. s/p PVI at [facility]  . Primary osteoarthritis of left knee    Mild  . Prolapsed internal hemorrhoids, grade 3 08/12/2015  . PVC's (premature ventricular contractions)   . Tubular adenoma of colon     Past Surgical History:  Procedure Laterality Date  . ABLATION     PVI at [facility]  . COLONOSCOPY    . CORONARY ARTERY BYPASS GRAFT  2000   CABG X5  . ESOPHAGOGASTRODUODENOSCOPY    . HEMORRHOID BANDING    . INTRAVASCULAR PRESSURE WIRE/FFR STUDY N/A 01/05/2018   Procedure: INTRAVASCULAR PRESSURE WIRE/FFR STUDY;  Surgeon: Sherren Mocha, MD;  Location: [place] CV LAB;  Service: Cardiovascular;  Laterality: N/A;  . LEFT HEART CATH AND CORS/GRAFTS ANGIOGRAPHY N/A 01/05/2018   Procedure: LEFT HEART CATH AND CORS/GRAFTS ANGIOGRAPHY;  Surgeon: Sherren Mocha, MD;  Location: [place] CV LAB;   Service: Cardiovascular;  Laterality: N/A;  . PERMANENT PACEMAKER INSERTION N/A 07/15/2012   Procedure: PERMANENT PACEMAKER INSERTION;  Surgeon: Deboraha Sprang, MD;  Location: [facility] CATH LAB;  Service: Cardiovascular;  Laterality: N/A;  . rotator cuff surgery Right 2017  . TONSILLECTOMY  1956     Medications Prior to Admission: Prior to Admission medications   Medication Sig Start Date End Date Taking? Authorizing Provider  atorvastatin (LIPITOR) 20 MG tablet Take 20 mg by mouth at bedtime.    Yes [provider]  Multiple Vitamin (MULTIVITAMIN) tablet Take 1 tablet by mouth daily with supper.    Yes [provider]  pantoprazole (PROTONIX) 40 MG tablet Take 1 tablet (40 mg total) by mouth daily. 12/20/17  Yes Milus Banister, MD  polyvinyl alcohol (LIQUIFILM TEARS) 1.4 % ophthalmic solution Place 1 drop into both eyes every 8 (eight) hours as needed for dry eyes.   Yes [provider]  ramipril (ALTACE) 2.5 MG capsule TAKE (1) CAPSULE DAILY. 10/12/18  Yes Minus Breeding, MD  rivaroxaban (XARELTO) 20 MG TABS tablet Take 20 mg by mouth daily with supper.   Yes [provider]  sildenafil (VIAGRA) 100 MG tablet Take 1 tablet (100 mg total) by mouth as needed for erectile dysfunction. 08/18/18 08/18/19  [name], Evie Lacks,  MD     Allergies:    Allergies  Allergen Reactions  . Cayenne Other (See Comments)    Sweats w/paprika too  . Niacin And Related Other (See Comments)    Upset stomach  . Sulfonamide Derivatives Other (See Comments)    "have no idea; mother told me I was allergic to"    Social History:   Social History   Socioeconomic History  . Marital status: Married    Spouse name: Not on file  . Number of children: 2  . Years of education: 76  . Highest education level: Not on file  Occupational History  . Occupation: Technical brewer: Engineer, mining    Comment: [employer]  Social Needs  . Financial resource  strain: Not on file  . Food insecurity:    Worry: Not on file    Inability: Not on file  . Transportation needs:    Medical: Not on file    Non-medical: Not on file  Tobacco Use  . Smoking status: Never Smoker  . Smokeless tobacco: Never Used  Substance and Sexual Activity  . Alcohol use: Yes    Alcohol/week: 21.0 standard drinks    Types: 21 Glasses of wine per week  . Drug use: No  . Sexual activity: Yes    Partners: Female  Lifestyle  . Physical activity:    Days per week: Not on file    Minutes per session: Not on file  . Stress: Not on file  Relationships  . Social connections:    Talks on phone: Not on file    Gets together: Not on file    Attends religious service: Not on file    Active member of club or organization: Not on file    Attends meetings of clubs or organizations: Not on file    Relationship status: Not on file  . Intimate partner violence:    Fear of current or ex partner: Not on file    Emotionally abused: Not on file    Physically abused: Not on file    Forced sexual activity: Not on file  Other Topics Concern  . Not on file  Social History Narrative   [employer] [place], [place].  Occupation:philanthropist at [employer]  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  2 daughters-'75, '79; 1 grandchild; step-daughter and step grandson.  Regular exercise-yes, runs 1.5-2 mi 4x/wk, also eliptical      Patient signed a Designated Party Release to allow his wife, Rahkim Rabalais, to have access to his medical records/ information.    Family History:   The patient's family history includes Coronary artery disease (age of onset: 25) in his other; Diabetes in his father and other; Hyperlipidemia in his unknown relative; Hypertension in his unknown relative; Multiple myeloma in his mother; Renal Disease in his father. There is no history of Colon cancer, Esophageal cancer, Stomach cancer, or Rectal cancer.    ROS:  Please see the history of present illness.  All  other ROS reviewed and negative.     Physical Exam/Data:   Vitals:   10/14/18 0811  BP: 124/80  Pulse: 71  Resp: (!) 27  Temp: 98 F (36.7 C)  TempSrc: Oral  SpO2: 99%  Weight: 86.2 kg  Height: 5' 11" (1.803 m)   No intake or output data in the 24 hours ending 10/14/18 0821 Filed Weights   10/14/18 0811  Weight: 86.2 kg   Body mass index is 26.5 kg/m.  General:  Well nourished, well developed, in no acute distress HEENT: normal Lymph: no adenopathy Neck: no JVD Endocrine:  No thryomegaly Vascular: No carotid bruits; FA pulses 2+ bilaterally without bruits  Cardiac:  normal S1, S2; RRR; no murmur  Lungs:  clear to auscultation bilaterally, no wheezing, rhonchi or rales  Abd: soft, nontender, no hepatomegaly  Ext: no edema Musculoskeletal:  No deformities, BUE and BLE strength normal and equal Skin: warm and dry  Neuro:  CNs 2-12 intact, no focal abnormalities noted Psych:  Normal affect     Laboratory Data:  ChemistryNo results for input(s): NA, K, CL, CO2, GLUCOSE, BUN, CREATININE, CALCIUM, GFRNONAA, GFRAA, ANIONGAP in the last 168 hours.  No results for input(s): PROT, ALBUMIN, AST, ALT, ALKPHOS, BILITOT in the last 168 hours. HematologyNo results for input(s): WBC, RBC, HGB, HCT, MCV, MCH, MCHC, RDW, PLT in the last 168 hours. Cardiac EnzymesNo results for input(s): TROPONINI in the last 168 hours. No results for input(s): TROPIPOC in the last 168 hours.  BNPNo results for input(s): BNP, PROBNP in the last 168 hours.  DDimer No results for input(s): DDIMER in the last 168 hours.  Radiology/Studies:  No results found.  Assessment and Plan:   Patient is a 72 yo with history of CAD and afib  Here for cardioversion.  He has not missed any doses of Xarelto Risks / benefits explained   He understands and agrees to proceeed.  Dorris Carnes   For questions or updates, please contact [facility] Please consult [email or web address] for contact info under         Signed, Dorris Carnes, MD  10/14/2018 8:21 AM

## 2018-10-14 NOTE — Op Note (Signed)
Cardioversion  Patient sedated by anesthesia with Propofol and Lidocaine With pads in the AP position, patient cardioverted to SR with 200 J synchronized biphasic energy  Procedure was without complication.    Pacer interrogated by [employer]

## 2018-10-14 NOTE — Telephone Encounter (Signed)
Patient calling back. Echocardiogram rescheduled to be done 11/11/18 at 8:00 AM. Follow up appointment with Dr. Caryl Comes is scheduled for 12/09/18 8:00 AM. Will forward to Dr. Olin Pia RN to make aware.

## 2018-10-14 NOTE — Telephone Encounter (Signed)
See other telephone encounter from today

## 2018-10-17 ENCOUNTER — Encounter (HOSPITAL_COMMUNITY): Payer: Self-pay | Admitting: Internal Medicine

## 2018-10-21 ENCOUNTER — Other Ambulatory Visit (HOSPITAL_COMMUNITY): Payer: PPO

## 2018-10-24 ENCOUNTER — Ambulatory Visit: Payer: PPO | Admitting: Internal Medicine

## 2018-11-11 ENCOUNTER — Ambulatory Visit (HOSPITAL_COMMUNITY): Payer: PPO | Attending: Cardiology

## 2018-11-11 ENCOUNTER — Other Ambulatory Visit: Payer: Self-pay

## 2018-11-11 DIAGNOSIS — I4819 Other persistent atrial fibrillation: Secondary | ICD-10-CM | POA: Diagnosis not present

## 2018-11-21 NOTE — Telephone Encounter (Signed)
Spoke with pt to clarify his questions. He wanted to see if we knew if he was back in AF. I advised him he would need to send in a remote transmission for [place] to be sure. Pt states he would send one in early next week and let us know when it is sent. He plans on discussing AF ablation options during his OV with SK on 1/10.

## 2018-11-27 LAB — CUP PACEART REMOTE DEVICE CHECK
Date Time Interrogation Session: 20191229184704
Implantable Lead Implant Date: 20130816
Implantable Lead Implant Date: 20130816
Implantable Lead Location: 753859
Implantable Pulse Generator Implant Date: 20130816
MDC IDC LEAD LOCATION: 753860
Pulse Gen Serial Number: 111255

## 2018-11-29 ENCOUNTER — Telehealth: Payer: Self-pay | Admitting: Internal Medicine

## 2018-11-29 NOTE — Telephone Encounter (Signed)
New  Message  ° ° ° ° ° ° ° ° °Patient returned your call would like a call back ° ° ° ° ° ° ° °

## 2018-11-29 NOTE — Telephone Encounter (Signed)
Spoke with pt and reviewed his transmission with him. Pt plans on keeping his appt with Dr Caryl Comes on 1/10 to discuss AF mgt and AF ablation here in [place] vs at [facility]. Pt understands parameters around his Afib which he should visit the ED for. Pt verified he remains anticoagulated as well. He verbalized understanding of our conversation and had no additional questions.

## 2018-11-30 DIAGNOSIS — H269 Unspecified cataract: Secondary | ICD-10-CM

## 2018-11-30 HISTORY — DX: Unspecified cataract: H26.9

## 2018-12-01 ENCOUNTER — Other Ambulatory Visit: Payer: Self-pay | Admitting: Cardiology

## 2018-12-05 ENCOUNTER — Ambulatory Visit (INDEPENDENT_AMBULATORY_CARE_PROVIDER_SITE_OTHER): Payer: PPO | Admitting: Family

## 2018-12-05 ENCOUNTER — Encounter: Payer: Self-pay | Admitting: Family

## 2018-12-05 VITALS — BP 138/78 | HR 94 | Temp 100.4°F | Ht 71.0 in | Wt 203.0 lb

## 2018-12-05 DIAGNOSIS — R6889 Other general symptoms and signs: Secondary | ICD-10-CM

## 2018-12-05 DIAGNOSIS — J209 Acute bronchitis, unspecified: Secondary | ICD-10-CM | POA: Diagnosis not present

## 2018-12-05 DIAGNOSIS — J019 Acute sinusitis, unspecified: Secondary | ICD-10-CM | POA: Diagnosis not present

## 2018-12-05 MED ORDER — HYDROCODONE-HOMATROPINE 5-1.5 MG/5ML PO SYRP: 5.0000 mL | ORAL_SOLUTION | Freq: Three times a day (TID) | ORAL | 0 refills | Status: DC | PRN

## 2018-12-05 MED ORDER — PREDNISONE 20 MG PO TABS: 20.0000 mg | ORAL_TABLET | Freq: Every day | ORAL | 0 refills | Status: DC

## 2018-12-05 MED ORDER — DOXYCYCLINE HYCLATE 100 MG PO TABS: 100.0000 mg | ORAL_TABLET | Freq: Two times a day (BID) | ORAL | 0 refills | Status: DC

## 2018-12-05 NOTE — Progress Notes (Signed)
Russell Griffith is a 73 y.o. male with the following history as recorded in EpicCare:  Patient Active Problem List   Diagnosis Date Noted  . Abnormal cardiovascular stress test 01/05/2018  . Left shoulder pain 11/12/2016  . Pain of left thumb 11/12/2016  . Abdominal pain, epigastric 10/29/2015  . Prolapsed internal hemorrhoids, grade 3 08/12/2015  . IT band syndrome 07/23/2015  . Partial tear of rotator cuff 12/04/2014  . Well adult exam 07/03/2014  . Jock itch 07/03/2014  . Dyslipidemia 03/06/2014  . Ejection fraction   . Diverticulitis of colon without hemorrhage 08/31/2013  . Mitral regurgitation   . Tricuspid regurgitation   . HX: anticoagulation   . Diffuse anterior T-wave inversions 10/24/2012  . PPM-[employer] 07/16/2012  . 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 Shortness of breath   . Aortic stenosis   . Plantar fasciitis of right foot 04/04/2012  . Numbness of toes   . CAD (coronary artery disease)   . Hx of CABG   . Drug therapy   . HTN (hypertension)   . Drug intolerance   . Pulmonic stenosis   . Aortic insufficiency   . Oral anticoagulation   . Hypokalemia   . A-fib ([place])   . QT prolongation on Tikosyn 03/26/2011  . PVC's (premature ventricular contractions)   . Carotid artery disease ([place])   . Elevated bilirubin   . Abnormal TSH 04/01/2010  . DERANGEMENT OF MENISCUS NOT ELSEWHERE CLASSIFIED 12/03/2009  . Bilateral knee pain 12/03/2009  . BENIGN PROSTATIC HYPERTROPHY, WITH OBSTRUCTION 09/30/2009  . Disorders of bursae and tendons in shoulder region, unspecified 08/22/2009  . 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 ADENOMATOUS COLONIC POLYP 03/06/2008  . ANXIETY DEPRESSION 03/06/2008  . ESOPHAGEAL STENOSIS 03/06/2008  . BARRETTS ESOPHAGUS 03/06/2008  . GASTRITIS, CHRONIC 03/06/2008  . DUODENITIS 03/06/2008  . PROSTATITIS, HX OF 03/06/2008  .  OSTEOARTHRITIS, KNEE, LEFT 11/04/2007  . FOOT PAIN, LEFT 11/04/2007  . COLONIC POLYPS, HX OF 08/09/2007    Current Outpatient Medications  Medication Sig Dispense Refill  . atorvastatin (LIPITOR) 20 MG tablet TAKE 1 TABLET ONCE DAILY. 30 tablet 9  . Multiple Vitamin (MULTIVITAMIN) tablet Take 1 tablet by mouth daily with supper.     . pantoprazole (PROTONIX) 40 MG tablet Take 1 tablet (40 mg total) by mouth daily. 30 tablet 11  . polyvinyl alcohol (LIQUIFILM TEARS) 1.4 % ophthalmic solution Place 1 drop into both eyes every 8 (eight) hours as needed for dry eyes.    . ramipril (ALTACE) 2.5 MG capsule TAKE (1) CAPSULE DAILY. 30 capsule 11  . rivaroxaban (XARELTO) 20 MG TABS tablet Take 20 mg by mouth daily with supper.    . sildenafil (VIAGRA) 100 MG tablet Take 1 tablet (100 mg total) by mouth as needed for erectile dysfunction. 12 tablet 11  . doxycycline (VIBRA-TABS) 100 MG tablet Take 1 tablet (100 mg total) by mouth 2 (two) times daily. 20 tablet 0  . HYDROcodone-homatropine (HYCODAN) 5-1.5 MG/5ML syrup Take 5 mLs by mouth every 8 (eight) hours as needed for cough. 120 mL 0  . predniSONE (DELTASONE) 20 MG tablet Take 1 tablet (20 mg total) by mouth daily with breakfast. 5 tablet 0   No current facility-administered medications for this visit.     Allergies: Cayenne; Niacin and related; and Sulfonamide derivatives  Past Medical History:  Diagnosis Date  . Aortic stenosis    Mild, echo, April, 2014  .  BPH (benign prostatic hyperplasia)   . CAD (coronary artery disease)    a. s/p CABG  . Carotid artery disease ([place])   . Colonic polyp   . Diverticulosis   . Elevated bilirubin    Mild chronic elevation, 2.0 January, 2011 stable  . GERD (gastroesophageal reflux disease)    Barrett's esophagus  . HTN (hypertension)   . Hyperlipidemia    Low HDL  . 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 Persistent atrial fibrillation    a. s/p PVI at [facility]  . Primary  osteoarthritis of left knee    Mild  . Prolapsed internal hemorrhoids, grade 3 08/12/2015  . PVC's (premature ventricular contractions)   . Tubular adenoma of colon     Past Surgical History:  Procedure Laterality Date  . ABLATION     PVI at [facility]  . CARDIOVERSION N/A 10/14/2018   Procedure: CARDIOVERSION;  Surgeon: Fay Records, MD;  Location: [facility] ENDOSCOPY;  Service: Cardiovascular;  Laterality: N/A;  . COLONOSCOPY    . CORONARY ARTERY BYPASS GRAFT  2000   CABG X5  . ESOPHAGOGASTRODUODENOSCOPY    . HEMORRHOID BANDING    . INTRAVASCULAR PRESSURE WIRE/FFR STUDY N/A 01/05/2018   Procedure: INTRAVASCULAR PRESSURE WIRE/FFR STUDY;  Surgeon: Sherren Mocha, MD;  Location: [place] CV LAB;  Service: Cardiovascular;  Laterality: N/A;  . LEFT HEART CATH AND CORS/GRAFTS ANGIOGRAPHY N/A 01/05/2018   Procedure: LEFT HEART CATH AND CORS/GRAFTS ANGIOGRAPHY;  Surgeon: Sherren Mocha, MD;  Location: [place] CV LAB;  Service: Cardiovascular;  Laterality: N/A;  . PERMANENT PACEMAKER INSERTION N/A 07/15/2012   Procedure: PERMANENT PACEMAKER INSERTION;  Surgeon: Deboraha Sprang, MD;  Location: [facility] CATH LAB;  Service: Cardiovascular;  Laterality: N/A;  . rotator cuff surgery Right 2017  . TONSILLECTOMY  1956    Family History  Problem Relation Age of Onset  . Coronary artery disease Other 70  . Diabetes Other   . Multiple myeloma Mother   . Diabetes Father   . Renal Disease Father   . Hyperlipidemia Other   . Hypertension Other   . Colon cancer Neg Hx   . Esophageal cancer Neg Hx   . Stomach cancer Neg Hx   . Rectal cancer Neg Hx     Social History   Tobacco Use  . Smoking status: Never Smoker  . Smokeless tobacco: Never Used  Substance Use Topics  . Alcohol use: Yes    Alcohol/week: 21.0 standard drinks    Types: 21 Glasses of wine per week    Subjective:  Patient started with flu like symptoms last Friday; now left with sinus pain, pressure/ low grade fever; cough keeping awake at  night; has tried OTC Allegra; + wheezing as well; did take flu shot this year; no family members sick;    Objective:  Vitals:   12/05/18 1139  BP: 138/78  Pulse: 94  Temp: (!) 100.4 F (38 C)  TempSrc: Oral  SpO2: 95%  Weight: 203 lb 0.6 oz (92.1 kg)  Height: '5\' 11"'  (1.803 m)    General: Well developed, well nourished, in no acute distress  Skin : Warm and dry.  Head: Normocephalic and atraumatic  Eyes: Sclera and conjunctiva clear; pupils round and reactive to light; extraocular movements intact  Ears: External normal; canals clear; tympanic membranes congested bilaterally Oropharynx: Pink, supple. No suspicious lesions  Neck: Supple without thyromegaly, adenopathy  Lungs: Respirations unlabored; wheezing noted in upper lobes CVS  exam: normal rate and regular rhythm.  Neurologic: Alert and oriented; speech intact; face symmetrical; moves all extremities well; CNII-XII intact without focal deficit   Assessment:  1. Flu-like symptoms   2. Acute sinusitis, recurrence not specified, unspecified location   3. Acute bronchitis, unspecified organism     Plan:  Suspect flu is source of symptoms- now concerning for secondary infection; Rx for Doxycyline, prednisone and Hydcodan; increase fluids, rest and follow-up worse, no better; Recommend to remain out of work for at least the next 3 days;  No follow-ups on file.  No orders of the defined types were placed in this encounter.   Requested Prescriptions   Signed Prescriptions Disp Refills  . doxycycline (VIBRA-TABS) 100 MG tablet 20 tablet 0    Sig: Take 1 tablet (100 mg total) by mouth 2 (two) times daily.  . predniSONE (DELTASONE) 20 MG tablet 5 tablet 0    Sig: Take 1 tablet (20 mg total) by mouth daily with breakfast.  . HYDROcodone-homatropine (HYCODAN) 5-1.5 MG/5ML syrup 120 mL 0    Sig: Take 5 mLs by mouth every 8 (eight) hours as needed for cough.

## 2018-12-09 ENCOUNTER — Ambulatory Visit: Payer: PPO | Admitting: Internal Medicine

## 2018-12-09 ENCOUNTER — Encounter: Payer: Self-pay | Admitting: Internal Medicine

## 2018-12-09 VITALS — BP 130/84 | HR 56 | Ht 71.0 in | Wt 199.8 lb

## 2018-12-09 DIAGNOSIS — I480 Paroxysmal atrial fibrillation: Secondary | ICD-10-CM

## 2018-12-09 DIAGNOSIS — I495 Sick sinus syndrome: Secondary | ICD-10-CM

## 2018-12-09 DIAGNOSIS — Z950 Presence of cardiac pacemaker: Secondary | ICD-10-CM | POA: Diagnosis not present

## 2018-12-09 LAB — CUP PACEART INCLINIC DEVICE CHECK
Date Time Interrogation Session: 20200110163211
Implantable Lead Location: 753860
Implantable Lead Model: 5076
Implantable Lead Model: 5076
MDC IDC LEAD IMPLANT DT: 20130816
MDC IDC LEAD IMPLANT DT: 20130816
MDC IDC LEAD LOCATION: 753859
MDC IDC PG IMPLANT DT: 20130816
MDC IDC PG SERIAL: 111255

## 2018-12-09 NOTE — Progress Notes (Signed)
Patient Care Team: [name], Evie Lacks, MD as PCP - General (Internal Medicine) Deboraha Sprang, MD as PCP - Electrophysiology (Cardiology) Stefanie Libel, MD (Family Medicine) Deboraha Sprang, MD (Cardiology) Jarome Matin, MD as Consulting Physician (Dermatology) Milus Banister, MD as Attending Physician (Gastroenterology) Minus Breeding, MD as Consulting Physician (Cardiology)   HPI  Russell Griffith is a 73 y.o. male Seen in followup for atrial fibrillation for which pulmonary he underwent pulmonary vein isolation at [facility]. He is also status post pacemaker implantation for significant pausing in the context of intermittent atrial fibrillation and sinus node dysfunction   History of coronary artery disease with bypass surgery 2000 and a Myoview November 2013 demonstrating no ischemia or scar. LV function was normal by echo February 2014  DATE TEST EF   2/19 LHC  55-60 % LIMA-LAD/SVG-D, SVG-PDA Patent SVG AM-OM2  12/19 Echo  60-65% LAE 41/2.0/33     Because of chronotropic incompetence, we made more aggressive his rate response.  This was associated with significant improvement.  He was submitted for treadmill testing earlier this month which prompted a great deal of concern because of polymorphic ventricular tachycardia he underwent catheterization which demonstrated occlusion of 1 of his vein grafts but other conduits were open   At his last visit, his device was reprogrammed AAIR to prevent inappropriate ventricular pacing as we were seen R on T.  Since then he has done really quite well.  He has had scant palpitations.  Has noted that he can actually exercise somewhat better over the last couple of months as his heart rate is now faster. \ Continues with significant AFib, assoc with fatigue and impaired exercise tolerance some palpitations.  I had on his behalf reached out to [facility] regarding repeat ablation.  He comes in today did consider repeat ablation and alternative  strategies as well as consideration of having his ablation done here  No chest pain.  Past Medical History:  Diagnosis Date  . Aortic stenosis    Mild, echo, April, 2014  . BPH (benign prostatic hyperplasia)   . CAD (coronary artery disease)    a. s/p CABG  . Carotid artery disease ([place])   . Colonic polyp   . Diverticulosis   . Elevated bilirubin    Mild chronic elevation, 2.0 January, 2011 stable  . GERD (gastroesophageal reflux disease)    Barrett's esophagus  . HTN (hypertension)   . Hyperlipidemia    Low HDL  . 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 Persistent atrial fibrillation    a. s/p PVI at [facility]  . Primary osteoarthritis of left knee    Mild  . Prolapsed internal hemorrhoids, grade 3 08/12/2015  . PVC's (premature ventricular contractions)   . Tubular adenoma of colon     Past Surgical History:  Procedure Laterality Date  . ABLATION     PVI at [facility]  . CARDIOVERSION N/A 10/14/2018   Procedure: CARDIOVERSION;  Surgeon: Fay Records, MD;  Location: [facility] ENDOSCOPY;  Service: Cardiovascular;  Laterality: N/A;  . COLONOSCOPY    . CORONARY ARTERY BYPASS GRAFT  2000   CABG X5  . ESOPHAGOGASTRODUODENOSCOPY    . HEMORRHOID BANDING    . INTRAVASCULAR PRESSURE WIRE/FFR STUDY N/A 01/05/2018   Procedure: INTRAVASCULAR PRESSURE WIRE/FFR STUDY;  Surgeon: Sherren Mocha, MD;  Location: [place] CV LAB;  Service: Cardiovascular;  Laterality: N/A;  . LEFT HEART CATH AND CORS/GRAFTS ANGIOGRAPHY N/A 01/05/2018  Procedure: LEFT HEART CATH AND CORS/GRAFTS ANGIOGRAPHY;  Surgeon: Sherren Mocha, MD;  Location: [place] CV LAB;  Service: Cardiovascular;  Laterality: N/A;  . PERMANENT PACEMAKER INSERTION N/A 07/15/2012   Procedure: PERMANENT PACEMAKER INSERTION;  Surgeon: Deboraha Sprang, MD;  Location: [facility] CATH LAB;  Service: Cardiovascular;  Laterality: N/A;  . rotator cuff surgery Right 2017  . TONSILLECTOMY  1956    Current Outpatient Medications   Medication Sig Dispense Refill  . atorvastatin (LIPITOR) 20 MG tablet Take 20 mg by mouth daily.    Marland Landress doxycycline (VIBRA-TABS) 100 MG tablet Take 1 tablet (100 mg total) by mouth 2 (two) times daily. 20 tablet 0  . Multiple Vitamin (MULTIVITAMIN) tablet Take 1 tablet by mouth daily with supper.     . pantoprazole (PROTONIX) 40 MG tablet Take 1 tablet (40 mg total) by mouth daily. 30 tablet 11  . polyvinyl alcohol (LIQUIFILM TEARS) 1.4 % ophthalmic solution Place 1 drop into both eyes every 8 (eight) hours as needed for dry eyes.    . predniSONE (DELTASONE) 20 MG tablet Take 1 tablet (20 mg total) by mouth daily with breakfast. 5 tablet 0  . ramipril (ALTACE) 2.5 MG capsule Take 2.5 mg by mouth daily.    . rivaroxaban (XARELTO) 20 MG TABS tablet Take 20 mg by mouth daily with supper.    . sildenafil (VIAGRA) 100 MG tablet Take 1 tablet (100 mg total) by mouth as needed for erectile dysfunction. 12 tablet 11   No current facility-administered medications for this visit.     Allergies  Allergen Reactions  . Cayenne Other (See Comments)    Sweats w/paprika too  . Niacin And Related Other (See Comments)    Upset stomach  . Sulfonamide Derivatives Other (See Comments)    "have no idea; mother told me I was allergic to"    Review of Systems negative except from HPI and PMH  Physical Exam BP 130/84   Pulse (!) 56   Ht 5\' 11"  (1.803 m)   Wt 199 lb 12.8 oz (90.6 kg)   SpO2 98%   BMI 27.87 kg/m  Well developed and nourished in no acute distress HENT normal Neck supple with JVP-flat Carotids brisk and full without bruits Clear Irregularly irregular rate and rhythm with controlled ventricular response, no murmurs or gallops Abd-soft with active BS without hepatomegaly No Clubbing cyanosis edema Skin-warm and dry A & Oriented  Grossly normal sensory and motor function  ECG demonstrated atrial fibrillation at 63 Intervals-/10/44 Axis 45 Nonspecific T wave flattening  Assessment  and  Plan  Atrial fibrillation s/p ablation persistent   PACs  Sinus node dysfunction  Ischemic heart disease with prior bypass surgery  Pacemaker  [employer]  Pacemaker function-abnormal resulting in R on T pacing  The patient has persistent atrial fibrillation with an increase in atrial fibrillation burden.  We discussed strategies including re-exposure to dofetilide reviewing the fact that his substrate was different from what was pre-ablation.  We discussed briefly amiodarone which I did not encourage given his young age and we discussed catheter ablation.  2 options were reviewed, 1 return to [facility] and the other seeing Dr. Rayann Heman.  I reached out to Dr. Rayann Heman who is glad to see him.  We will arrange this.  I have also asked the question as to whether short-term amiodarone as bridge to ablation might be of benefit.  I will review this again with Mr. Barrick when I get him on the telephone.  For now we will continue him on his current medications.  Heart rate is reasonably controlled.  We spent more than 50% of our >25 min visit in face to face counseling regarding the above

## 2018-12-09 NOTE — Patient Instructions (Addendum)
Medication Instructions:  Your physician recommends that you continue on your current medications as directed. Please refer to the Current Medication list given to you today.  Labwork: None ordered.  Testing/Procedures: None ordered.  Follow-Up: Your physician recommends that you schedule a follow-up appointment in:   1 year with Dr Caryl Comes  Remote monitoring is used to monitor your Pacemaker from home. This monitoring reduces the number of office visits required to check your device to one time per year. It allows [place] to keep an eye on the functioning of your device to ensure it is working properly. You are scheduled for a device check from home on 12/22/2018. You may send your transmission at any time that day. If you have a wireless device, the transmission will be sent automatically. After your physician reviews your transmission, you will receive a postcard with your next transmission date.    Any Other Special Instructions Will Be Listed Below (If Applicable).     If you need a refill on your cardiac medications before your next appointment, please call your pharmacy.

## 2018-12-16 ENCOUNTER — Encounter: Payer: Self-pay | Admitting: Internal Medicine

## 2018-12-16 ENCOUNTER — Ambulatory Visit: Payer: PPO | Admitting: Internal Medicine

## 2018-12-16 VITALS — BP 112/70 | HR 60 | Ht 71.0 in | Wt 197.0 lb

## 2018-12-16 DIAGNOSIS — I4819 Other persistent atrial fibrillation: Secondary | ICD-10-CM

## 2018-12-16 DIAGNOSIS — I495 Sick sinus syndrome: Secondary | ICD-10-CM

## 2018-12-16 DIAGNOSIS — Z950 Presence of cardiac pacemaker: Secondary | ICD-10-CM

## 2018-12-16 MED ORDER — METOPROLOL TARTRATE 100 MG PO TABS: 100.0000 mg | ORAL_TABLET | Freq: Once | ORAL | 0 refills | Status: DC

## 2018-12-16 NOTE — Progress Notes (Signed)
Electrophysiology Office Note   Date:  12/16/2018   ID:  Russell Griffith, DOB [birth year], MRN [id number]  PCP:  Cassandria Anger, MD  Primary Electrophysiologist: Dr Caryl Comes Primary Cardiology:  [facility]  CC: afib   History of Present Illness: Russell Griffith is a 73 y.o. male who presents today for electrophysiology evaluation.   He has a h/o paroxysmal atrial fibrillation.  He reports that his atrial arrhythmias began in 2000 shortly after his CABG.  He was initially found to have atrial flutter and underwent CTI ablation by Dr Lovena Le.  He then began having increased afib episodes.  He was evaluated by Dr Caryl Comes and started on tikosyn.  He subsequently had afib ablation at [facility] by Dr Inocente Salles team.  He did very well post ablation for several years.  Unfortunately, his afib returned.  He is s/p PPM (BSci) by Dr Caryl Comes for sick sinus syndrome.   More recently, his afib burden has increased further.  He reports occasional palpitations.  He has reduced exercise capacity and fatigue during afib.  Today, he denies symptoms of chest pain, shortness of breath, orthopnea, PND, lower extremity edema, claudication, dizziness, presyncope, syncope, bleeding, or neurologic sequela. The patient is tolerating medications without difficulties and is otherwise without complaint today.    Past Medical History:  Diagnosis Date  . Aortic stenosis    Mild, echo, April, 2014  . BPH (benign prostatic hyperplasia)   . CAD (coronary artery disease)    a. s/p CABG  . Carotid artery disease ([place])   . Colonic polyp   . Diverticulosis   . Elevated bilirubin    Mild chronic elevation, 2.0 January, 2011 stable  . GERD (gastroesophageal reflux disease)    Barrett's esophagus  . HTN (hypertension)   . Hyperlipidemia    Low HDL  . 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 Persistent atrial fibrillation    a. s/p [facility]VI at [facility]  . Primary osteoarthritis of left knee    Mild  .  Prolapsed internal hemorrhoids, grade 3 08/12/2015  . PVC's (premature ventricular contractions)   . Tubular adenoma of colon    Past Surgical History:  Procedure Laterality Date  . ABLATION     PVI at [facility]  . CARDIOVERSION N/A 10/14/2018   Procedure: CARDIOVERSION;  Surgeon: Fay Records, MD;  Location: [facility] ENDOSCOPY;  Service: Cardiovascular;  Laterality: N/A;  . COLONOSCOPY    . CORONARY ARTERY BYPASS GRAFT  2000   CABG X5  . ESOPHAGOGASTRODUODENOSCOPY    . HEMORRHOID BANDING    . INTRAVASCULAR PRESSURE WIRE/FFR STUDY N/A 01/05/2018   Procedure: INTRAVASCULAR PRESSURE WIRE/FFR STUDY;  Surgeon: Sherren Mocha, MD;  Location: [place] CV LAB;  Service: Cardiovascular;  Laterality: N/A;  . LEFT HEART CATH AND CORS/GRAFTS ANGIOGRAPHY N/A 01/05/2018   Procedure: LEFT HEART CATH AND CORS/GRAFTS ANGIOGRAPHY;  Surgeon: Sherren Mocha, MD;  Location: [place] CV LAB;  Service: Cardiovascular;  Laterality: N/A;  . PERMANENT PACEMAKER INSERTION N/A 07/15/2012   Procedure: PERMANENT PACEMAKER INSERTION;  Surgeon: Deboraha Sprang, MD;  Location: [facility] CATH LAB;  Service: Cardiovascular;  Laterality: N/A;  . rotator cuff surgery Right 2017  . TONSILLECTOMY  1956     Current Outpatient Medications  Medication Sig Dispense Refill  . atorvastatin (LIPITOR) 20 MG tablet Take 20 mg by mouth daily.    Russell Griffith doxycycline (VIBRA-TABS) 100 MG tablet Take 1 tablet (100 mg total) by mouth 2 (two)  times daily. 20 tablet 0  . Multiple Vitamin (MULTIVITAMIN) tablet Take 1 tablet by mouth daily with supper.     . pantoprazole (PROTONIX) 40 MG tablet Take 1 tablet (40 mg total) by mouth daily. 30 tablet 11  . polyvinyl alcohol (LIQUIFILM TEARS) 1.4 % ophthalmic solution Place 1 drop into both eyes every 8 (eight) hours as needed for dry eyes.    . predniSONE (DELTASONE) 20 MG tablet Take 1 tablet (20 mg total) by mouth daily with breakfast. 5 tablet 0  . ramipril (ALTACE) 2.5 MG capsule Take 2.5 mg by mouth daily.     . rivaroxaban (XARELTO) 20 MG TABS tablet Take 20 mg by mouth daily with supper.    . sildenafil (VIAGRA) 100 MG tablet Take 1 tablet (100 mg total) by mouth as needed for erectile dysfunction. 12 tablet 11  . metoprolol tartrate (LOPRESSOR) 100 MG tablet Take 1 tablet (100 mg total) by mouth once for 1 dose. One tablet 2 hours prior to cardiac CT 1 tablet 0   No current facility-administered medications for this visit.     Allergies:   Cayenne; Niacin and related; and Sulfonamide derivatives   Social History:  The patient  reports that he has never smoked. He has never used smokeless tobacco. He reports current alcohol use of about 21.0 standard drinks of alcohol per week. He reports that he does not use drugs.   Family History:  The patient's  family history includes Coronary artery disease (age of onset: 59) in an other family member; Diabetes in his father and another family member; Hyperlipidemia in an other family member; Hypertension in an other family member; Multiple myeloma in his mother; Renal Disease in his father.    ROS:  Please see the history of present illness.   All other systems are personally reviewed and negative.    PHYSICAL EXAM: VS:  BP 112/70   Pulse 60   Ht '5\' 11"'  (1.803 m)   Wt 197 lb (89.4 kg)   SpO2 95%   BMI 27.48 kg/m  , BMI Body mass index is 27.48 kg/m. GEN: Well nourished, well developed, in no acute distress  HEENT: normal  Neck: no JVD, carotid bruits, or masses Cardiac: RRR; no murmurs, rubs, or gallops,no edema  Respiratory:  clear to auscultation bilaterally, normal work of breathing GI: soft, nontender, nondistended, + BS MS: no deformity or atrophy  Skin: warm and dry  Neuro:  Strength and sensation are intact Psych: euthymic mood, full affect  EKG:  EKG 12/09/2018 reveals afib, V rate 56 bpm   Recent Labs: 08/18/2018: ALT 32; TSH 4.61 09/21/2018: BUN 16; Creatinine, Ser 1.12; Hemoglobin 14.8; Platelets 178; Potassium 4.6; Sodium 142    personally reviewed   Lipid Panel     Component Value Date/Time   CHOL 110 08/18/2018 0853   TRIG 48.0 08/18/2018 0853   HDL 31.90 (L) 08/18/2018 0853   CHOLHDL 3 08/18/2018 0853   VLDL 9.6 08/18/2018 0853   LDLCALC 68 08/18/2018 0853   personally reviewed   Wt Readings from Last 3 Encounters:  12/16/18 197 lb (89.4 kg)  12/09/18 199 lb 12.8 oz (90.6 kg)  12/05/18 203 lb 0.6 oz (92.1 kg)      Other studies personally reviewed: Additional studies/ records that were reviewed today include: Prior echo,  Dr Olin Pia notes  Review of the above records today demonstrates: as above   ASSESSMENT AND PLAN:  1.  Persistent afib The patient has symptomatic, recurrent persistent  atrial fibrillation. he has failed medical therapy with tikosyn.  He is s/p CTI ablation by Dr Lovena Le and [place] at [facility] 7 years ago. Chads2vasc score is 3.  he is anticoagulated with xarleto. Therapeutic strategies for afib including medicine and ablation were discussed in detail with the patient today. Risk, benefits, and alternatives to EP study and radiofrequency ablation for afib were also discussed in detail today. These risks include but are not limited to stroke, bleeding, vascular damage, tamponade, perforation, damage to the esophagus, lungs, and other structures, pulmonary vein stenosis, worsening renal function,  Pacemaker lead dislodgement, and death. The patient understands these risk and wishes to proceed.  We will therefore proceed with catheter ablation at the next available time.  Carto, ICE, anesthesia are requested for the procedure.  Will also obtain cardiac CT prior to the procedure to exclude LAA thrombus and further evaluate atrial anatomy.  2. Sick sinus syndrome S/p PPM   Current medicines are reviewed at length with the patient today.   The patient does not have concerns regarding his medicines.  The following changes were made today:  none  Labs/ tests ordered today include:  Orders  Placed This Encounter  Procedures  . CT CARDIAC MORPH/PULM VEIN W/CM&W/O CA SCORE  . CT CORONARY FRACTIONAL FLOW RESERVE DATA PREP  . CT CORONARY FRACTIONAL FLOW RESERVE FLUID ANALYSIS  . CBC w/Diff  . Basic Metabolic Panel (BMET)     Signed, [name], MD  12/16/2018 9:56 AM     [facility] [address] [place] [address] [phone] (office) [phone] (fax)

## 2018-12-16 NOTE — Patient Instructions (Addendum)
Medication Instructions:  Your physician recommends that you continue on your current medications as directed. Please refer to the Current Medication list given to you today.  Labwork: You will get lab work :  BMP and CBC.  Please schedule for 30 days prior to ablation January 25, 2019  Testing/Procedures: Your physician has requested that you have cardiac CT. Cardiac computed tomography (CT) is a painless test that uses an x-ray machine to take clear, detailed pictures of your heart. For further information please visit [facility]. Please follow instruction sheet as given.  Schedule cardiac CT  Your physician has recommended that you have an ablation. Catheter ablation is a medical procedure used to treat some cardiac arrhythmias (irregular heartbeats). During catheter ablation, a long, thin, flexible tube is put into a blood vessel in your groin (upper thigh), or neck. This tube is called an ablation catheter. It is then guided to your heart through the blood vessel. Radio frequency waves destroy small areas of heart tissue where abnormal heartbeats may cause an arrhythmia to start. Please see the instruction sheet given to you today.   Follow-Up: You will follow up with Roderic Palau, NP with the Atrial fibrillation (Afib) clinic 4 weeks after your ablation.  You will follow up with Dr. Rayann Heman 3 months after your procedure.      CARDIAC CT INSTRUCTIONS:  Please arrive at the [facility] main entrance of [facility] at 30-45 minutes prior to test start time  [facility] [address], [place] [address] [phone]  Proceed to the [facility] Radiology Department (First Floor).  Please follow these instructions carefully (unless otherwise directed):  Hold all erectile dysfunction medications at least 48 hours prior to test.  On the Night Before the Test: . Be sure to Drink plenty of water. . Do not consume any  caffeinated/decaffeinated beverages or chocolate 12 hours prior to your test. . Do not take any antihistamines 12 hours prior to your test.  On the Day of the Test: . Drink plenty of water. Do not drink any water within one hour of the test. . Do not eat any food 4 hours prior to the test. . You may take your regular medications prior to the test.  . Take metoprolol (Lopressor) two hours prior to test.                -If HR is less than 55 BPM- No Beta Blocker                -IF HR is greater than 55 BPM and patient is less than or equal to 66 yrs old Lopressor 100mg  x1.                    After the Test: . Drink plenty of water. . After receiving IV contrast, you may experience a mild flushed feeling. This is normal. . On occasion, you may experience a mild rash up to 24 hours after the test. This is not dangerous. If this occurs, you can take Benadryl 25 mg and increase your fluid intake. . If you experience trouble breathing, this can be serious. If it is severe call 911 IMMEDIATELY. If it is mild, please call our office.    ABLATION INSTRUCTIONS:  Please arrive to ADMITTING down the hall from the [facility] main entrance of [place] [facility] at: 5:30 am on January 25, 2019 Do not eat or drink after midnight prior to procedure On the  morning of your procedure do not take any medications. Plan for one night stay.   You will need someone to drive you home at discharge.     Cardiac Ablation Cardiac ablation is a procedure to disable (ablate) a small amount of heart tissue in very specific places. The heart has many electrical connections. Sometimes these connections are abnormal and can cause the heart to beat very fast or irregularly. Ablating some of the problem areas can improve the heart rhythm or return it to normal. Ablation may be done for people who:  Have Wolff-Parkinson-White syndrome.  Have fast heart rhythms (tachycardia).  Have taken medicines for an abnormal  heart rhythm (arrhythmia) that were not effective or caused side effects.  Have a high-risk heartbeat that may be life-threatening. During the procedure, a small incision is made in the neck or the groin, and a long, thin, flexible tube (catheter) is inserted into the incision and moved to the heart. Small devices (electrodes) on the tip of the catheter will send out electrical currents. A type of X-ray (fluoroscopy) will be used to help guide the catheter and to provide images of the heart. Tell a health care provider about:  Any allergies you have.  All medicines you are taking, including vitamins, herbs, eye drops, creams, and over-the-counter medicines.  Any problems you or family members have had with anesthetic medicines.  Any blood disorders you have.  Any surgeries you have had.  Any medical conditions you have, such as kidney failure.  Whether you are pregnant or may be pregnant. What are the risks? Generally, this is a safe procedure. However, problems may occur, including:  Infection.  Bruising and bleeding at the catheter insertion site.  Bleeding into the chest, especially into the sac that surrounds the heart. This is a serious complication.  Stroke or blood clots.  Damage to other structures or organs.  Allergic reaction to medicines or dyes.  Need for a permanent pacemaker if the normal electrical system is damaged. A pacemaker is a small computer that sends electrical signals to the heart and helps your heart beat normally.  The procedure not being fully effective. This may not be recognized until months later. Repeat ablation procedures are sometimes required. What happens before the procedure?  Follow instructions from your health care provider about eating or drinking restrictions.  Ask your health care provider about: ? Changing or stopping your regular medicines. This is especially important if you are taking diabetes medicines or blood  thinners. ? Taking medicines such as aspirin and ibuprofen. These medicines can thin your blood. Do not take these medicines before your procedure if your health care provider instructs you not to.  Plan to have someone take you home from the hospital or clinic.  If you will be going home right after the procedure, plan to have someone with you for 24 hours. What happens during the procedure?  To lower your risk of infection: ? Your health care team will wash or sanitize their hands. ? Your skin will be washed with soap. ? Hair may be removed from the incision area.  An IV tube will be inserted into one of your veins.  You will be given a medicine to help you relax (sedative).  The skin on your neck or groin will be numbed.  An incision will be made in your neck or your groin.  A needle will be inserted through the incision and into a large vein in your neck or groin.  A catheter will be inserted into the needle and moved to your heart.  Dye may be injected through the catheter to help your surgeon see the area of the heart that needs treatment.  Electrical currents will be sent from the catheter to ablate heart tissue in desired areas. There are three types of energy that may be used to ablate heart tissue: ? Heat (radiofrequency energy). ? Laser energy. ? Extreme cold (cryoablation).  When the necessary tissue has been ablated, the catheter will be removed.  Pressure will be held on the catheter insertion area to prevent excessive bleeding.  A bandage (dressing) will be placed over the catheter insertion area. The procedure may vary among health care providers and hospitals. What happens after the procedure?  Your blood pressure, heart rate, breathing rate, and blood oxygen level will be monitored until the medicines you were given have worn off.  Your catheter insertion area will be monitored for bleeding. You will need to lie still for a few hours to ensure that you do  not bleed from the catheter insertion area.  Do not drive for 24 hours or as long as directed by your health care provider. Summary  Cardiac ablation is a procedure to disable (ablate) a small amount of heart tissue in very specific places. Ablating some of the problem areas can improve the heart rhythm or return it to normal.  During the procedure, electrical currents will be sent from the catheter to ablate heart tissue in desired areas. This information is not intended to replace advice given to you by your health care provider. Make sure you discuss any questions you have with your health care provider. Document Released: 04/04/2009 Document Revised: 10/05/2016 Document Reviewed: 10/05/2016 Elsevier Interactive Patient Education  2019 [employer].

## 2018-12-22 ENCOUNTER — Ambulatory Visit (INDEPENDENT_AMBULATORY_CARE_PROVIDER_SITE_OTHER): Payer: PPO

## 2018-12-22 DIAGNOSIS — I495 Sick sinus syndrome: Secondary | ICD-10-CM

## 2018-12-23 ENCOUNTER — Encounter: Payer: Self-pay | Admitting: Cardiology

## 2018-12-23 ENCOUNTER — Other Ambulatory Visit: Payer: PPO | Admitting: *Deleted

## 2018-12-23 DIAGNOSIS — I4819 Other persistent atrial fibrillation: Secondary | ICD-10-CM

## 2018-12-23 LAB — CBC WITH DIFFERENTIAL/PLATELET
Basophils Absolute: 0 10*3/uL (ref 0.0–0.2)
Basos: 1 %
EOS (ABSOLUTE): 0.1 10*3/uL (ref 0.0–0.4)
Eos: 2 %
Hematocrit: 43.2 % (ref 37.5–51.0)
Hemoglobin: 14.6 g/dL (ref 13.0–17.7)
Immature Grans (Abs): 0 10*3/uL (ref 0.0–0.1)
Immature Granulocytes: 0 %
Lymphocytes Absolute: 1.7 10*3/uL (ref 0.7–3.1)
Lymphs: 30 %
MCH: 29.6 pg (ref 26.6–33.0)
MCHC: 33.8 g/dL (ref 31.5–35.7)
MCV: 87 fL (ref 79–97)
Monocytes Absolute: 0.8 10*3/uL (ref 0.1–0.9)
Monocytes: 15 %
Neutrophils Absolute: 3 10*3/uL (ref 1.4–7.0)
Neutrophils: 52 %
Platelets: 183 10*3/uL (ref 150–450)
RBC: 4.94 x10E6/uL (ref 4.14–5.80)
RDW: 13.2 % (ref 11.6–15.4)
WBC: 5.7 10*3/uL (ref 3.4–10.8)

## 2018-12-23 LAB — BASIC METABOLIC PANEL
BUN/Creatinine Ratio: 18 (ref 10–24)
BUN: 20 mg/dL (ref 8–27)
CO2: 21 mmol/L (ref 20–29)
Calcium: 9.7 mg/dL (ref 8.6–10.2)
Chloride: 105 mmol/L (ref 96–106)
Creatinine, Ser: 1.13 mg/dL (ref 0.76–1.27)
GFR calc Af Amer: 76 mL/min/{1.73_m2} (ref >59)
GFR calc non Af Amer: 65 mL/min/{1.73_m2} (ref >59)
Glucose: 100 mg/dL — ABNORMAL HIGH (ref 65–99)
Potassium: 4.4 mmol/L (ref 3.5–5.2)
SODIUM: 145 mmol/L — AB (ref 134–144)

## 2018-12-23 NOTE — Progress Notes (Signed)
Remote pacemaker transmission.   

## 2018-12-25 ENCOUNTER — Other Ambulatory Visit: Payer: Self-pay | Admitting: Gastroenterology

## 2018-12-25 LAB — CUP PACEART REMOTE DEVICE CHECK
Battery Remaining Percentage: 74 %
Brady Statistic RA Percent Paced: 2 %
Brady Statistic RV Percent Paced: 0 %
Date Time Interrogation Session: 20200123063100
Implantable Lead Implant Date: 20130816
Implantable Lead Implant Date: 20130816
Implantable Lead Location: 753859
Implantable Lead Location: 753860
Implantable Lead Model: 5076
Implantable Lead Model: 5076
Implantable Pulse Generator Implant Date: 20130816
Lead Channel Impedance Value: 544 Ohm
Lead Channel Impedance Value: 580 Ohm
Lead Channel Pacing Threshold Pulse Width: 0.4 ms
Lead Channel Setting Pacing Amplitude: 2 V
Lead Channel Setting Sensing Sensitivity: 2.5 mV
MDC IDC MSMT BATTERY REMAINING LONGEVITY: 66 mo
MDC IDC MSMT LEADCHNL RA PACING THRESHOLD AMPLITUDE: 0.8 V
Pulse Gen Serial Number: 111255

## 2019-01-17 ENCOUNTER — Telehealth (HOSPITAL_COMMUNITY): Payer: Self-pay | Admitting: Emergency Medicine

## 2019-01-17 NOTE — Telephone Encounter (Signed)
Reaching out to patient to offer assistance regarding upcoming cardiac imaging study; pt verbalizes understanding of appt date/time, parking situation and where to check in, pre-test NPO status and medications ordered, and verified current allergies; name and call back number provided for further questions should they arise Chad Donoghue RN Navigator Cardiac Imaging [facility] [phone] office [phone] cell 

## 2019-01-19 ENCOUNTER — Ambulatory Visit (HOSPITAL_COMMUNITY)
Admission: RE | Admit: 2019-01-19 | Discharge: 2019-01-19 | Disposition: A | Discharge status: Home / Self Care | Payer: PPO | Source: Ambulatory Visit | Attending: Internal Medicine | Admitting: Internal Medicine

## 2019-01-19 ENCOUNTER — Ambulatory Visit (HOSPITAL_COMMUNITY): Admission: RE | Admit: 2019-01-19 | Payer: PPO | Source: Ambulatory Visit

## 2019-01-19 DIAGNOSIS — I4819 Other persistent atrial fibrillation: Secondary | ICD-10-CM | POA: Insufficient documentation

## 2019-01-19 MED ORDER — IOPAMIDOL (ISOVUE-370) INJECTION 76%
80.0000 mL | Freq: Once | INTRAVENOUS | Status: AC | PRN
  Administered 2019-01-19: 80 mL via INTRAVENOUS

## 2019-01-19 NOTE — Progress Notes (Signed)
Per Joseph Art, Dr. Lovena Le has reviewed the patients settings on pacemaker and patient is able to be d/c home.   Patient denies any complaints at this time. Patient wife is coming to pick patient up. Patient taken to [facility] by this nurse to meet with wife at d/c.

## 2019-01-19 NOTE — Progress Notes (Signed)
CT complete. Patient denies any complaints at this time. Patient offered snack and beverage. Patient will call ride to take him home.   Per Dr. Johnsie Cancel, patient may go home with ride and allow lopressor to wear off over the next day or so.   Patient informed should any further issues arise including lightheaded or dizziness occur to seek medical attention. Patient verbalizes understanding.

## 2019-01-19 NOTE — Progress Notes (Signed)
Joey here with [employer] here to assess patients Psychologist, forensic.

## 2019-01-19 NOTE — Progress Notes (Signed)
Dr. Johnsie Cancel at bedside talking with patient. Per Dr. Johnsie Cancel, he will call [employer] to have pacemaker checked.  Patient in stretcher and complains of lightheadedness.

## 2019-01-19 NOTE — Progress Notes (Signed)
Patient reports of lightheaded at this time. Patients heart rate ranging from 27-61. Patient reports of having pace maker and setting set at 55. Dr Johnsie Cancel called and states he is coming to see patient.

## 2019-01-19 NOTE — Progress Notes (Deleted)
Patient ambulatory to nurses station with no difficulty noted. Patient denies any complaints. Currently waiting on clearance from [employer] to speak with provider to allow patient to be d/c. Patient updated.

## 2019-01-19 NOTE — Progress Notes (Signed)
Patient ambulatory to nurses station with no difficulty noted. Patient denies any complaints. Currently waiting on clearance from [employer] to speak with provider to allow patient to be d/c. Patient updated.

## 2019-01-20 ENCOUNTER — Other Ambulatory Visit: Payer: Self-pay | Admitting: *Deleted

## 2019-01-20 ENCOUNTER — Other Ambulatory Visit: Payer: PPO | Admitting: *Deleted

## 2019-01-20 DIAGNOSIS — I495 Sick sinus syndrome: Secondary | ICD-10-CM

## 2019-01-20 DIAGNOSIS — I4819 Other persistent atrial fibrillation: Secondary | ICD-10-CM

## 2019-01-20 LAB — BASIC METABOLIC PANEL
BUN/Creatinine Ratio: 19 (ref 10–24)
BUN: 22 mg/dL (ref 8–27)
CO2: 24 mmol/L (ref 20–29)
Calcium: 9.8 mg/dL (ref 8.6–10.2)
Chloride: 104 mmol/L (ref 96–106)
Creatinine, Ser: 1.17 mg/dL (ref 0.76–1.27)
GFR calc Af Amer: 72 mL/min/{1.73_m2} (ref >59)
GFR calc non Af Amer: 62 mL/min/{1.73_m2} (ref >59)
Glucose: 94 mg/dL (ref 65–99)
Potassium: 4.4 mmol/L (ref 3.5–5.2)
SODIUM: 143 mmol/L (ref 134–144)

## 2019-01-20 LAB — CBC WITH DIFFERENTIAL/PLATELET
Basophils Absolute: 0 10*3/uL (ref 0.0–0.2)
Basos: 1 %
EOS (ABSOLUTE): 0.1 10*3/uL (ref 0.0–0.4)
Eos: 2 %
Hematocrit: 43.2 % (ref 37.5–51.0)
Hemoglobin: 14.5 g/dL (ref 13.0–17.7)
Immature Grans (Abs): 0 10*3/uL (ref 0.0–0.1)
Immature Granulocytes: 0 %
Lymphocytes Absolute: 1.6 10*3/uL (ref 0.7–3.1)
Lymphs: 25 %
MCH: 29.8 pg (ref 26.6–33.0)
MCHC: 33.6 g/dL (ref 31.5–35.7)
MCV: 89 fL (ref 79–97)
MONOS ABS: 1 10*3/uL — AB (ref 0.1–0.9)
Monocytes: 16 %
Neutrophils Absolute: 3.6 10*3/uL (ref 1.4–7.0)
Neutrophils: 56 %
Platelets: 166 10*3/uL (ref 150–450)
RBC: 4.87 x10E6/uL (ref 4.14–5.80)
RDW: 13.5 % (ref 11.6–15.4)
WBC: 6.4 10*3/uL (ref 3.4–10.8)

## 2019-01-25 ENCOUNTER — Encounter (HOSPITAL_COMMUNITY): Payer: Self-pay | Admitting: Certified Registered Nurse Anesthetist

## 2019-01-25 ENCOUNTER — Ambulatory Visit (HOSPITAL_COMMUNITY): Payer: PPO | Admitting: Certified Registered Nurse Anesthetist

## 2019-01-25 ENCOUNTER — Ambulatory Visit (HOSPITAL_COMMUNITY)
Admission: RE | Admit: 2019-01-25 | Discharge: 2019-01-25 | Disposition: A | Discharge status: Home / Self Care | Payer: PPO | Attending: Internal Medicine | Admitting: Internal Medicine

## 2019-01-25 ENCOUNTER — Encounter (HOSPITAL_COMMUNITY)
Admission: RE | Disposition: A | Discharge status: Home / Self Care | Payer: Self-pay | Source: Home / Self Care | Attending: Internal Medicine

## 2019-01-25 ENCOUNTER — Other Ambulatory Visit: Payer: Self-pay

## 2019-01-25 DIAGNOSIS — N400 Enlarged prostate without lower urinary tract symptoms: Secondary | ICD-10-CM | POA: Insufficient documentation

## 2019-01-25 DIAGNOSIS — I6529 Occlusion and stenosis of unspecified carotid artery: Secondary | ICD-10-CM | POA: Insufficient documentation

## 2019-01-25 DIAGNOSIS — Z79899 Other long term (current) drug therapy: Secondary | ICD-10-CM | POA: Insufficient documentation

## 2019-01-25 DIAGNOSIS — I495 Sick sinus syndrome: Secondary | ICD-10-CM | POA: Insufficient documentation

## 2019-01-25 DIAGNOSIS — Z882 Allergy status to sulfonamides status: Secondary | ICD-10-CM | POA: Diagnosis not present

## 2019-01-25 DIAGNOSIS — I10 Essential (primary) hypertension: Secondary | ICD-10-CM | POA: Diagnosis not present

## 2019-01-25 DIAGNOSIS — I2510 Atherosclerotic heart disease of native coronary artery without angina pectoris: Secondary | ICD-10-CM | POA: Insufficient documentation

## 2019-01-25 DIAGNOSIS — Z9889 Other specified postprocedural states: Secondary | ICD-10-CM

## 2019-01-25 DIAGNOSIS — K2270 Barrett's esophagus without dysplasia: Secondary | ICD-10-CM | POA: Insufficient documentation

## 2019-01-25 DIAGNOSIS — Z8249 Family history of ischemic heart disease and other diseases of the circulatory system: Secondary | ICD-10-CM | POA: Diagnosis not present

## 2019-01-25 DIAGNOSIS — I491 Atrial premature depolarization: Secondary | ICD-10-CM | POA: Insufficient documentation

## 2019-01-25 DIAGNOSIS — I4892 Unspecified atrial flutter: Secondary | ICD-10-CM | POA: Insufficient documentation

## 2019-01-25 DIAGNOSIS — Z888 Allergy status to other drugs, medicaments and biological substances status: Secondary | ICD-10-CM | POA: Diagnosis not present

## 2019-01-25 DIAGNOSIS — I060 Rheumatic aortic stenosis: Secondary | ICD-10-CM | POA: Diagnosis not present

## 2019-01-25 DIAGNOSIS — K219 Gastro-esophageal reflux disease without esophagitis: Secondary | ICD-10-CM | POA: Diagnosis not present

## 2019-01-25 DIAGNOSIS — Z951 Presence of aortocoronary bypass graft: Secondary | ICD-10-CM | POA: Insufficient documentation

## 2019-01-25 DIAGNOSIS — Z8679 Personal history of other diseases of the circulatory system: Secondary | ICD-10-CM

## 2019-01-25 DIAGNOSIS — Z950 Presence of cardiac pacemaker: Secondary | ICD-10-CM | POA: Insufficient documentation

## 2019-01-25 DIAGNOSIS — E785 Hyperlipidemia, unspecified: Secondary | ICD-10-CM | POA: Insufficient documentation

## 2019-01-25 DIAGNOSIS — M1712 Unilateral primary osteoarthritis, left knee: Secondary | ICD-10-CM | POA: Insufficient documentation

## 2019-01-25 DIAGNOSIS — I4891 Unspecified atrial fibrillation: Secondary | ICD-10-CM | POA: Diagnosis not present

## 2019-01-25 DIAGNOSIS — I4819 Other persistent atrial fibrillation: Secondary | ICD-10-CM | POA: Diagnosis not present

## 2019-01-25 DIAGNOSIS — Z7901 Long term (current) use of anticoagulants: Secondary | ICD-10-CM | POA: Diagnosis not present

## 2019-01-25 HISTORY — PX: ATRIAL FIBRILLATION ABLATION: EP1191

## 2019-01-25 LAB — POCT ACTIVATED CLOTTING TIME
Activated Clotting Time: 263 seconds
Activated Clotting Time: 307 seconds
Activated Clotting Time: 164 seconds

## 2019-01-25 SURGERY — ATRIAL FIBRILLATION ABLATION
Anesthesia: General

## 2019-01-25 MED ORDER — PROTAMINE SULFATE 10 MG/ML IV SOLN
INTRAVENOUS | Status: DC | PRN
  Administered 2019-01-25 (×2): 10 mg via INTRAVENOUS
  Administered 2019-01-25: 20 mg via INTRAVENOUS

## 2019-01-25 MED ORDER — ADENOSINE 6 MG/2ML IV SOLN
INTRAVENOUS | Status: AC
  Filled 2019-01-25: qty 4

## 2019-01-25 MED ORDER — ROCURONIUM BROMIDE 10 MG/ML (PF) SYRINGE
PREFILLED_SYRINGE | INTRAVENOUS | Status: DC | PRN
  Administered 2019-01-25: 10 mg via INTRAVENOUS
  Administered 2019-01-25: 80 mg via INTRAVENOUS

## 2019-01-25 MED ORDER — HEPARIN (PORCINE) IN NACL 1000-0.9 UT/500ML-% IV SOLN
INTRAVENOUS | Status: AC
  Filled 2019-01-25: qty 500

## 2019-01-25 MED ORDER — PROPOFOL 10 MG/ML IV BOLUS
INTRAVENOUS | Status: DC | PRN
  Administered 2019-01-25: 130 mg via INTRAVENOUS

## 2019-01-25 MED ORDER — PHENYLEPHRINE 40 MCG/ML (10ML) SYRINGE FOR IV PUSH (FOR BLOOD PRESSURE SUPPORT)
PREFILLED_SYRINGE | INTRAVENOUS | Status: DC | PRN
  Administered 2019-01-25: 80 ug via INTRAVENOUS

## 2019-01-25 MED ORDER — HEPARIN SODIUM (PORCINE) 1000 UNIT/ML IJ SOLN
INTRAMUSCULAR | Status: DC | PRN
  Administered 2019-01-25: 12000 [IU] via INTRAVENOUS
  Administered 2019-01-25: 1000 [IU] via INTRAVENOUS

## 2019-01-25 MED ORDER — HYDROCODONE-ACETAMINOPHEN 5-325 MG PO TABS: 1.0000 | ORAL_TABLET | ORAL | Status: DC | PRN

## 2019-01-25 MED ORDER — ONDANSETRON HCL 4 MG/2ML IJ SOLN
INTRAMUSCULAR | Status: DC | PRN
  Administered 2019-01-25: 4 mg via INTRAVENOUS

## 2019-01-25 MED ORDER — BUPIVACAINE HCL (PF) 0.25 % IJ SOLN
INTRAMUSCULAR | Status: DC | PRN
  Administered 2019-01-25: 30 mL

## 2019-01-25 MED ORDER — SODIUM CHLORIDE 0.9 % IV SOLN: 250.0000 mL | INTRAVENOUS | Status: DC | PRN

## 2019-01-25 MED ORDER — SODIUM CHLORIDE 0.9% FLUSH: 3.0000 mL | Freq: Two times a day (BID) | INTRAVENOUS | Status: DC

## 2019-01-25 MED ORDER — ISOPROTERENOL HCL 0.2 MG/ML IJ SOLN
INTRAMUSCULAR | Status: AC
  Filled 2019-01-25: qty 5

## 2019-01-25 MED ORDER — ISOPROTERENOL HCL 0.2 MG/ML IJ SOLN
INTRAVENOUS | Status: DC | PRN
  Administered 2019-01-25: 10 ug/min via INTRAVENOUS

## 2019-01-25 MED ORDER — HEPARIN SODIUM (PORCINE) 1000 UNIT/ML IJ SOLN
INTRAMUSCULAR | Status: DC | PRN
  Administered 2019-01-25: 5000 [IU] via INTRAVENOUS

## 2019-01-25 MED ORDER — ACETAMINOPHEN 325 MG PO TABS
650.0000 mg | ORAL_TABLET | ORAL | Status: DC | PRN
  Filled 2019-01-25: qty 2

## 2019-01-25 MED ORDER — SODIUM CHLORIDE 0.9% FLUSH: 3.0000 mL | INTRAVENOUS | Status: DC | PRN

## 2019-01-25 MED ORDER — LACTATED RINGERS IV SOLN
INTRAVENOUS | Status: DC | PRN
  Administered 2019-01-25: 09:00:00 via INTRAVENOUS

## 2019-01-25 MED ORDER — SODIUM CHLORIDE 0.9 % IV SOLN
INTRAVENOUS | Status: DC
  Administered 2019-01-25: 06:00:00 via INTRAVENOUS

## 2019-01-25 MED ORDER — FENTANYL CITRATE (PF) 100 MCG/2ML IJ SOLN
INTRAMUSCULAR | Status: DC | PRN
  Administered 2019-01-25: 100 ug via INTRAVENOUS

## 2019-01-25 MED ORDER — HEPARIN SODIUM (PORCINE) 1000 UNIT/ML IJ SOLN
INTRAMUSCULAR | Status: AC
  Filled 2019-01-25: qty 1

## 2019-01-25 MED ORDER — LIDOCAINE 2% (20 MG/ML) 5 ML SYRINGE
INTRAMUSCULAR | Status: DC | PRN
  Administered 2019-01-25: 100 mg via INTRAVENOUS

## 2019-01-25 MED ORDER — HEPARIN (PORCINE) IN NACL 2-0.9 UNITS/ML
INTRAMUSCULAR | Status: AC | PRN
  Administered 2019-01-25: 500 mL

## 2019-01-25 MED ORDER — BUPIVACAINE HCL (PF) 0.25 % IJ SOLN
INTRAMUSCULAR | Status: AC
  Filled 2019-01-25: qty 30

## 2019-01-25 MED ORDER — MIDAZOLAM HCL 5 MG/5ML IJ SOLN
INTRAMUSCULAR | Status: DC | PRN
  Administered 2019-01-25: 1 mg via INTRAVENOUS

## 2019-01-25 MED ORDER — SODIUM CHLORIDE 0.9 % IV SOLN
INTRAVENOUS | Status: DC | PRN
  Administered 2019-01-25: 40 ug/min via INTRAVENOUS

## 2019-01-25 MED ORDER — ADENOSINE 6 MG/2ML IV SOLN
INTRAVENOUS | Status: DC | PRN
  Administered 2019-01-25 (×2): 12 mg via INTRAVENOUS

## 2019-01-25 MED ORDER — SUGAMMADEX SODIUM 200 MG/2ML IV SOLN
INTRAVENOUS | Status: DC | PRN
  Administered 2019-01-25: 200 mg via INTRAVENOUS

## 2019-01-25 SURGICAL SUPPLY — 19 items
BLANKET WARM UNDERBOD FULL ACC (MISCELLANEOUS) ×2 IMPLANT
CATH MAPPNG PENTARAY F 2-6-2MM (CATHETERS) IMPLANT
CATH NAVISTAR SMARTTOUCH DF (ABLATOR) ×2 IMPLANT
CATH SOUNDSTAR 3D IMAGING (CATHETERS) ×2 IMPLANT
CATH WEBSTER BI DIR CS D-F CRV (CATHETERS) ×2 IMPLANT
COVER SWIFTLINK CONNECTOR (BAG) ×2 IMPLANT
NDL BAYLIS TRANSSEPTAL 71CM (NEEDLE) IMPLANT
NEEDLE BAYLIS TRANSSEPTAL 71CM (NEEDLE) ×3 IMPLANT
PACK EP LATEX FREE (CUSTOM PROCEDURE TRAY) ×3
PACK EP LF (CUSTOM PROCEDURE TRAY) ×1 IMPLANT
PAD DEFIB LIFELINK (PAD) ×2 IMPLANT
PAD PRO RADIOLUCENT 2001M-C (PAD) ×3 IMPLANT
PATCH CARTO3 (PAD) ×2 IMPLANT
PENTARAY F 2-6-2MM (CATHETERS) ×3
SHEATH AVANTI 11F 11CM (SHEATH) ×2 IMPLANT
SHEATH PINNACLE 7F 10CM (SHEATH) ×4 IMPLANT
SHEATH PINNACLE 9F 10CM (SHEATH) ×2 IMPLANT
SHEATH SWARTZ TS SL2 63CM 8.5F (SHEATH) ×2 IMPLANT
TUBING SMART ABLATE COOLFLOW (TUBING) ×2 IMPLANT

## 2019-01-25 NOTE — Anesthesia Preprocedure Evaluation (Addendum)
Anesthesia Evaluation  Patient identified by MRN, date of birth, ID band Patient awake    Reviewed: Allergy & Precautions, NPO status , Patient's Chart, lab work & pertinent test results, reviewed documented beta blocker date and time   Airway Mallampati: I  TM Distance: >3 FB Neck ROM: Full    Dental  (+) Teeth Intact, Dental Advisory Given   Pulmonary    Pulmonary exam normal        Cardiovascular hypertension, Pt. on medications and Pt. on home beta blockers + CAD and + CABG  Normal cardiovascular exam+ Valvular Problems/Murmurs AS      Neuro/Psych Depression    GI/Hepatic GERD  Medicated and Controlled,  Endo/Other    Renal/GU      Musculoskeletal   Abdominal   Peds  Hematology   Anesthesia Other Findings   Reproductive/Obstetrics                            Anesthesia Physical Anesthesia Plan  ASA: III  Anesthesia Plan: General   Post-op Pain Management:    Induction: Intravenous  PONV Risk Score and Plan: 2 and Ondansetron and Treatment may vary due to age or medical condition  Airway Management Planned: Oral ETT  Additional Equipment:   Intra-op Plan:   Post-operative Plan: Extubation in OR  Informed Consent: I have reviewed the patients History and Physical, chart, labs and discussed the procedure including the risks, benefits and alternatives for the proposed anesthesia with the patient or authorized representative who has indicated his/her understanding and acceptance.     Dental advisory given  Plan Discussed with: CRNA, Surgeon and Anesthesiologist  Anesthesia Plan Comments:        Anesthesia Quick Evaluation

## 2019-01-25 NOTE — Anesthesia Procedure Notes (Signed)
Procedure Name: Intubation Date/Time: 01/25/2019 8:51 AM Performed by: Carney Living, CRNA Pre-anesthesia Checklist: Patient identified, Emergency Drugs available, Suction available, Patient being monitored and Timeout performed Patient Re-evaluated:Patient Re-evaluated prior to induction Oxygen Delivery Method: Circle system utilized Preoxygenation: Pre-oxygenation with 100% oxygen Induction Type: IV induction Ventilation: Mask ventilation without difficulty Laryngoscope Size: Mac and 4 Grade View: Grade II Tube type: Oral Tube size: 7.5 mm Number of attempts: 1 Airway Equipment and Method: Stylet Placement Confirmation: ETT inserted through vocal cords under direct vision,  positive ETCO2 and breath sounds checked- equal and bilateral Secured at: 22 cm Tube secured with: Tape Dental Injury: Teeth and Oropharynx as per pre-operative assessment

## 2019-01-25 NOTE — Progress Notes (Addendum)
Pt having reoccurring hematoma, softens quickly to level 0 then returns. After 2 reoccurrences, this RN called Debbie from cath lab to assess. Hematoma had returned, Pressure held 10 minutes by Debbie. Site now level 0, VSS.  Another hematoma present, pressure held 10 min then [place], [place] paged. Came to bedside, informed to hold pressure longer. RN held pressure for 20 min. VSS. Site level 0. Pressure dressing applied per [place], PA. Will continue to assess.

## 2019-01-25 NOTE — Discharge Instructions (Signed)
Resume Xarelto tonight (2/26)   Post procedure care instructions No driving for 4 days. No lifting over 5 lbs for 1 week. No vigorous or sexual activity for 1 week. You may return to work on 02/01/2019. Keep procedure site clean & dry. If you notice increased pain, swelling, bleeding or pus, call/return!  You may shower, but no soaking baths/hot tubs/pools for 1 week.    Cardiac Ablation, Care After This sheet gives you information about how to care for yourself after your procedure. Your health care provider may also give you more specific instructions. If you have problems or questions, contact your health care provider. What can I expect after the procedure? After the procedure, it is common to have:  Bruising around your puncture site.  Tenderness around your puncture site.  Skipped heartbeats.  Tiredness (fatigue). Follow these instructions at home: Puncture site care   Follow instructions from your health care provider about how to take care of your puncture site. Make sure you: ? Wash your hands with soap and water before you change your bandage (dressing). If soap and water are not available, use hand sanitizer. ? Change your dressing as told by your health care provider. ? Leave stitches (sutures), skin glue, or adhesive strips in place. These skin closures may need to stay in place for up to 2 weeks. If adhesive strip edges start to loosen and curl up, you may trim the loose edges. Do not remove adhesive strips completely unless your health care provider tells you to do that.  Check your puncture site every day for signs of infection. Check for: ? Redness, swelling, or pain. ? Fluid or blood. If your puncture site starts to bleed, lie down on your back, apply firm pressure to the area, and contact your health care provider. ? Warmth. ? Pus or a bad smell. Driving  Ask your health care provider when it is safe for you to drive again after the procedure.  Do not drive or use  heavy machinery while taking prescription pain medicine.  Do not drive for 24 hours if you were given a medicine to help you relax (sedative) during your procedure. Activity  Avoid activities that take a lot of effort for at least 3 days after your procedure.  Do not lift anything that is heavier than 10 lb (4.5 kg), or the limit that you are told, until your health care provider says that it is safe.  Return to your normal activities as told by your health care provider. Ask your health care provider what activities are safe for you. General instructions  Take over-the-counter and prescription medicines only as told by your health care provider.  Do not use any products that contain nicotine or tobacco, such as cigarettes and e-cigarettes. If you need help quitting, ask your health care provider.  Do not take baths, swim, or use a hot tub until your health care provider approves.  Do not drink alcohol for 24 hours after your procedure.  Keep all follow-up visits as told by your health care provider. This is important. Contact a health care provider if:  You have redness, mild swelling, or pain around your puncture site.  You have fluid or blood coming from your puncture site that stops after applying firm pressure to the area.  Your puncture site feels warm to the touch.  You have pus or a bad smell coming from your puncture site.  You have a fever.  You have chest pain or discomfort  that spreads to your neck, jaw, or arm.  You are sweating a lot.  You feel nauseous.  You have a fast or irregular heartbeat.  You have shortness of breath.  You are dizzy or light-headed and feel the need to lie down.  You have pain or numbness in the arm or leg closest to your puncture site. Get help right away if:  Your puncture site suddenly swells.  Your puncture site is bleeding and the bleeding does not stop after applying firm pressure to the area. These symptoms may represent  a serious problem that is an emergency. Do not wait to see if the symptoms will go away. Get medical help right away. Call your local emergency services (911 in the [place] Do not drive yourself to the hospital. Summary  After the procedure, it is normal to have bruising and tenderness at the puncture site in your groin, neck, or forearm.  Check your puncture site every day for signs of infection.  Get help right away if your puncture site is bleeding and the bleeding does not stop after applying firm pressure to the area. This is a medical emergency. This information is not intended to replace advice given to you by your health care provider. Make sure you discuss any questions you have with your health care provider. Document Released: 02/25/2017 Document Revised: 02/25/2017 Document Reviewed: 02/25/2017 Elsevier Interactive Patient Education  2019 [employer].

## 2019-01-25 NOTE — Transfer of Care (Signed)
Immediate Anesthesia Transfer of Care Note  Patient: [employer]  Procedure(s) Performed: ATRIAL FIBRILLATION ABLATION (N/A )  Patient Location: Cath Lab  Anesthesia Type:General  Level of Consciousness: awake, alert , oriented and patient cooperative  Airway & Oxygen Therapy: Patient Spontanous Breathing and Patient connected to nasal cannula oxygen  Post-op Assessment: Report given to RN, Post -op Vital signs reviewed and stable and Patient moving all extremities X 4  Post vital signs: Reviewed and stable  Last Vitals:  Vitals Value Taken Time  BP 149/76 01/25/2019 11:06 AM  Temp 36.1 C 01/25/2019 11:04 AM  Pulse 71 01/25/2019 11:07 AM  Resp 15 01/25/2019 11:07 AM  SpO2 97 % 01/25/2019 11:07 AM  Vitals shown include unvalidated device data.  Last Pain:  Vitals:   01/25/19 1104  TempSrc: Temporal  PainSc: 0-No pain      Patients Stated Pain Goal: 5 (40/81/44 [id number])  Complications: No apparent anesthesia complications

## 2019-01-25 NOTE — Progress Notes (Signed)
No change to hematoma or external bleeding noted after ambulation.

## 2019-01-25 NOTE — Anesthesia Postprocedure Evaluation (Signed)
Anesthesia Post Note  Patient: Demaurion Dicioccio Debarge  Procedure(s) Performed: ATRIAL FIBRILLATION ABLATION (N/A )     Patient location during evaluation: PACU Anesthesia Type: General Level of consciousness: awake and alert Pain management: pain level controlled Vital Signs Assessment: post-procedure vital signs reviewed and stable Respiratory status: spontaneous breathing, nonlabored ventilation, respiratory function stable and patient connected to nasal cannula oxygen Cardiovascular status: blood pressure returned to baseline and stable Postop Assessment: no apparent nausea or vomiting Anesthetic complications: no    Last Vitals:  Vitals:   01/25/19 1245 01/25/19 1300  BP: 139/74 139/66  Pulse: 70 70  Resp: 17 15  Temp:    SpO2: 98% 96%    Last Pain:  Vitals:   01/25/19 1209  TempSrc: Oral  PainSc: 0-No pain                 Fayette Gasner DAVID

## 2019-01-25 NOTE — H&P (Signed)
PCP:  Cassandria Anger, MD           Primary Electrophysiologist: Dr Caryl Comes Primary Cardiology:  [place]  CC: afib   History of Present Illness: Russell Griffith is a 73 y.o. male who presents today for electrophysiology study and ablation for afib.   He has a h/o paroxysmal atrial fibrillation.  He reports that his atrial arrhythmias began in 2000 shortly after his CABG.  He was initially found to have atrial flutter and underwent CTI ablation by Dr Lovena Le.  He then began having increased afib episodes.  He was evaluated by Dr Caryl Comes and started on tikosyn.  He subsequently had afib ablation at [facility] by Dr Inocente Salles team.  He did very well post ablation for several years.  Unfortunately, his afib returned.  He is s/p PPM (BSci) by Dr Caryl Comes for sick sinus syndrome.   More recently, his afib burden has increased further.  He reports occasional palpitations.  He has reduced exercise capacity and fatigue during afib.  Today, he denies symptoms of chest pain, shortness of breath, orthopnea, PND, lower extremity edema, claudication, dizziness, presyncope, syncope, bleeding, or neurologic sequela. The patient is tolerating medications without difficulties and is otherwise without complaint today.        Past Medical History:  Diagnosis Date  . Aortic stenosis    Mild, echo, April, 2014  . BPH (benign prostatic hyperplasia)   . CAD (coronary artery disease)    a. s/p CABG  . Carotid artery disease ([place])   . Colonic polyp   . Diverticulosis   . Elevated bilirubin    Mild chronic elevation, 2.0 January, 2011 stable  . GERD (gastroesophageal reflux disease)    Barrett's esophagus  . HTN (hypertension)   . Hyperlipidemia    Low HDL  . 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 Persistent atrial fibrillation    a. s/p PVI at [facility]  . Primary osteoarthritis of left knee    Mild  . Prolapsed internal hemorrhoids, grade 3 08/12/2015  . PVC's  (premature ventricular contractions)   . Tubular adenoma of colon         Past Surgical History:  Procedure Laterality Date  . ABLATION     PVI at [facility]  . CARDIOVERSION N/A 10/14/2018   Procedure: CARDIOVERSION;  Surgeon: Fay Records, MD;  Location: [facility] ENDOSCOPY;  Service: Cardiovascular;  Laterality: N/A;  . COLONOSCOPY    . CORONARY ARTERY BYPASS GRAFT  2000   CABG X5  . ESOPHAGOGASTRODUODENOSCOPY    . HEMORRHOID BANDING    . INTRAVASCULAR PRESSURE WIRE/FFR STUDY N/A 01/05/2018   Procedure: INTRAVASCULAR PRESSURE WIRE/FFR STUDY;  Surgeon: Sherren Mocha, MD;  Location: [place] CV LAB;  Service: Cardiovascular;  Laterality: N/A;  . LEFT HEART CATH AND CORS/GRAFTS ANGIOGRAPHY N/A 01/05/2018   Procedure: LEFT HEART CATH AND CORS/GRAFTS ANGIOGRAPHY;  Surgeon: Sherren Mocha, MD;  Location: [place] CV LAB;  Service: Cardiovascular;  Laterality: N/A;  . PERMANENT PACEMAKER INSERTION N/A 07/15/2012   Procedure: PERMANENT PACEMAKER INSERTION;  Surgeon: Deboraha Sprang, MD;  Location: [facility] CATH LAB;  Service: Cardiovascular;  Laterality: N/A;  . rotator cuff surgery Right 2017  . TONSILLECTOMY  1956           Current Outpatient Medications  Medication Sig Dispense Refill  . atorvastatin (LIPITOR) 20 MG tablet Take 20 mg by mouth daily.    Marland Strubel doxycycline (VIBRA-TABS) 100 MG tablet Take 1 tablet (  100 mg total) by mouth 2 (two) times daily. 20 tablet 0  . Multiple Vitamin (MULTIVITAMIN) tablet Take 1 tablet by mouth daily with supper.     . pantoprazole (PROTONIX) 40 MG tablet Take 1 tablet (40 mg total) by mouth daily. 30 tablet 11  . polyvinyl alcohol (LIQUIFILM TEARS) 1.4 % ophthalmic solution Place 1 drop into both eyes every 8 (eight) hours as needed for dry eyes.    . predniSONE (DELTASONE) 20 MG tablet Take 1 tablet (20 mg total) by mouth daily with breakfast. 5 tablet 0  . ramipril (ALTACE) 2.5 MG capsule Take 2.5 mg by mouth daily.    . rivaroxaban  (XARELTO) 20 MG TABS tablet Take 20 mg by mouth daily with supper.    . sildenafil (VIAGRA) 100 MG tablet Take 1 tablet (100 mg total) by mouth as needed for erectile dysfunction. 12 tablet 11  . metoprolol tartrate (LOPRESSOR) 100 MG tablet Take 1 tablet (100 mg total) by mouth once for 1 dose. One tablet 2 hours prior to cardiac CT 1 tablet 0   No current facility-administered medications for this visit.     Allergies:   Cayenne; Niacin and related; and Sulfonamide derivatives   Social History:  The patient  reports that he has never smoked. He has never used smokeless tobacco. He reports current alcohol use of about 21.0 standard drinks of alcohol per week. He reports that he does not use drugs.   Family History:  The patient's  family history includes Coronary artery disease (age of onset: 34) in an other family member; Diabetes in his father and another family member; Hyperlipidemia in an other family member; Hypertension in an other family member; Multiple myeloma in his mother; Renal Disease in his father.    ROS:  Please see the history of present illness.   All other systems are personally reviewed and negative.    PHYSICAL EXAM: Vitals:   01/25/19 0609  BP: 135/71  Pulse: (!) 55  Temp: 98 F (36.7 C)  SpO2: 97%    GEN: Well nourished, well developed, in no acute distress  HEENT: normal  Neck: no JVD, carotid bruits, or masses Cardiac: RRR; no murmurs, rubs, or gallops,no edema  Respiratory:  clear to auscultation bilaterally, normal work of breathing GI: soft, nontender, nondistended, + BS MS: no deformity or atrophy  Skin: warm and dry  Neuro:  Strength and sensation are intact Psych: euthymic mood, full affect       Wt Readings from Last 3 Encounters:  12/16/18 197 lb (89.4 kg)  12/09/18 199 lb 12.8 oz (90.6 kg)  12/05/18 203 lb 0.6 oz (92.1 kg)      Other studies personally reviewed: Additional studies/ records that were reviewed today  include: Prior echo,  Dr Olin Pia notes  Review of the above records today demonstrates: as above   ASSESSMENT AND PLAN:  1.  Persistent afib The patient has symptomatic, recurrent persistent atrial fibrillation. he has failed medical therapy with tikosyn.  He is s/p CTI ablation by Dr Lovena Le and [place] at [facility] 7 years ago. Chads2vasc score is 3.  he is anticoagulated with xarleto.  Risk, benefits, and alternatives to EP study and radiofrequency ablation for afib were also discussed in detail today. These risks include but are not limited to stroke, bleeding, vascular damage, tamponade, perforation, damage to the esophagus, lungs, and other structures, pulmonary vein stenosis, worsening renal function, and death. The patient understands these risk and wishes to proceed.  Cardiac CT reviewed at length with patient and wife today He reports compliance with xarelto without interruption  2. Severe RA enlargement Pt aware to have follow-up sleep study with Dr Caryl Comes.  [name] MD, [facility] 01/25/2019 7:50 AM

## 2019-01-26 ENCOUNTER — Encounter (HOSPITAL_COMMUNITY): Payer: Self-pay | Admitting: Internal Medicine

## 2019-01-27 ENCOUNTER — Ambulatory Visit (HOSPITAL_COMMUNITY)
Admission: RE | Admit: 2019-01-27 | Discharge: 2019-01-27 | Disposition: A | Discharge status: Home / Self Care | Payer: PPO | Source: Ambulatory Visit | Attending: Physician Assistant | Admitting: Physician Assistant

## 2019-01-27 ENCOUNTER — Encounter (HOSPITAL_COMMUNITY): Payer: Self-pay | Admitting: Physician Assistant

## 2019-01-27 ENCOUNTER — Ambulatory Visit (HOSPITAL_BASED_OUTPATIENT_CLINIC_OR_DEPARTMENT_OTHER)
Admission: RE | Admit: 2019-01-27 | Discharge: 2019-01-27 | Disposition: A | Discharge status: Home / Self Care | Payer: PPO | Source: Ambulatory Visit | Attending: Nurse Practitioner | Admitting: Nurse Practitioner

## 2019-01-27 VITALS — BP 136/72 | HR 72 | Ht 71.0 in | Wt 201.8 lb

## 2019-01-27 DIAGNOSIS — Z79899 Other long term (current) drug therapy: Secondary | ICD-10-CM | POA: Diagnosis not present

## 2019-01-27 DIAGNOSIS — I97618 Postprocedural hemorrhage and hematoma of a circulatory system organ or structure following other circulatory system procedure: Secondary | ICD-10-CM | POA: Diagnosis not present

## 2019-01-27 DIAGNOSIS — Z950 Presence of cardiac pacemaker: Secondary | ICD-10-CM | POA: Diagnosis not present

## 2019-01-27 DIAGNOSIS — I2510 Atherosclerotic heart disease of native coronary artery without angina pectoris: Secondary | ICD-10-CM | POA: Diagnosis not present

## 2019-01-27 DIAGNOSIS — J8410 Pulmonary fibrosis, unspecified: Secondary | ICD-10-CM | POA: Insufficient documentation

## 2019-01-27 DIAGNOSIS — Z7901 Long term (current) use of anticoagulants: Secondary | ICD-10-CM | POA: Insufficient documentation

## 2019-01-27 DIAGNOSIS — I4819 Other persistent atrial fibrillation: Secondary | ICD-10-CM

## 2019-01-27 DIAGNOSIS — K219 Gastro-esophageal reflux disease without esophagitis: Secondary | ICD-10-CM | POA: Insufficient documentation

## 2019-01-27 DIAGNOSIS — I10 Essential (primary) hypertension: Secondary | ICD-10-CM | POA: Diagnosis not present

## 2019-01-27 DIAGNOSIS — S301XXS Contusion of abdominal wall, sequela: Secondary | ICD-10-CM | POA: Diagnosis not present

## 2019-01-27 DIAGNOSIS — E785 Hyperlipidemia, unspecified: Secondary | ICD-10-CM | POA: Insufficient documentation

## 2019-01-27 DIAGNOSIS — Z951 Presence of aortocoronary bypass graft: Secondary | ICD-10-CM | POA: Diagnosis not present

## 2019-01-27 DIAGNOSIS — I350 Nonrheumatic aortic (valve) stenosis: Secondary | ICD-10-CM | POA: Insufficient documentation

## 2019-01-27 LAB — CBC
HCT: 42 % (ref 39.0–52.0)
Hemoglobin: 13.6 g/dL (ref 13.0–17.0)
MCH: 29.1 pg (ref 26.0–34.0)
MCHC: 32.4 g/dL (ref 30.0–36.0)
MCV: 89.7 fL (ref 80.0–100.0)
Platelets: 155 10*3/uL (ref 150–400)
RBC: 4.68 MIL/uL (ref 4.22–5.81)
RDW: 14 % (ref 11.5–15.5)
WBC: 7.9 10*3/uL (ref 4.0–10.5)
nRBC: 0 % (ref 0.0–0.2)

## 2019-01-27 NOTE — Progress Notes (Signed)
Primary Care Physician: Russell Anger, MD Primary Cardiologist: Dr Percival Spanish Primary Electrophysiologist: Dr Caryl Comes Referring Physician: Dr Massie Bougie Russell Griffith is a 73 y.o. male with a history of CAD s/p CABG, sinus node dysfunction s/p pacemaker implant, HTN, HLD, carotid artery disease, and persistent atrial fibrillation and atrial flutter who presents for consultation in the [facility].  The patient was initially diagnosed with atrial flutter and underwent CTI ablation with Dr Lovena Le. Patient was later diagnosed with atrial fibrillation and had an afib ablation at [facility]. Unfortunately, his afib returned and he is now s/p repeat ablation with Dr Rayann Heman. He did have a hematoma post ablation which was stable after holding pressure prior to D/C. He reports that his bruising is worse since his discharge and the ablation site is more swollen and tender. No external bleeding or drainage. No significant chest pain or swallowing difficulties.  Today, he denies symptoms of palpitations, chest pain, shortness of breath, orthopnea, PND, lower extremity edema, dizziness, presyncope, syncope, snoring, daytime somnolence, bleeding, or neurologic sequela. The patient is tolerating medications without difficulties and is otherwise without complaint today.   [name] Filed Weights   01/27/19 1359  Weight: 91.5 kg    Family History  Problem Relation Age of Onset  . Coronary artery disease Other 70  . Diabetes Other   . Multiple myeloma Mother   . Diabetes Father   . Renal Disease Father   . Hyperlipidemia Other   . Hypertension Other   . Colon cancer Neg Hx   . Esophageal cancer Neg Hx   . Stomach cancer Neg Hx   . Rectal cancer Neg Hx      Atrial Fibrillation Management history:  Previous antiarrhythmic drugs: tikosyn Previous cardioversions: 10/14/18 Previous ablations: remote [facility], 01/25/19 CHADS2VASC score:  3 (age, HTN, CAD) Anticoagulation history: Xarelto   Past Medical History:  Diagnosis Date  . Aortic stenosis    Mild, echo, April, 2014  . BPH (benign prostatic hyperplasia)   . CAD (coronary artery disease)    a. s/p CABG  . Carotid artery disease ([place])   . Colonic polyp   . Diverticulosis   . Elevated bilirubin    Mild chronic elevation, 2.0 January, 2011 stable  . GERD (gastroesophageal reflux disease)    Barrett's esophagus  . HTN (hypertension)   . Hyperlipidemia    Low HDL  . 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 Persistent atrial fibrillation    a. s/p PVI at [facility]  . Primary osteoarthritis of left knee    Mild  . Prolapsed internal hemorrhoids, grade 3 08/12/2015  . PVC's (premature ventricular contractions)   . Tubular adenoma of colon    Past Surgical History:  Procedure Laterality Date  . ABLATION     PVI at [facility]  . ATRIAL FIBRILLATION ABLATION N/A 01/25/2019   Procedure: ATRIAL FIBRILLATION ABLATION;  Surgeon: [name], MD;  Location: [place] CV LAB;  Service: Cardiovascular;  Laterality: N/A;  . CARDIOVERSION N/A 10/14/2018   Procedure: CARDIOVERSION;  Surgeon: Fay Records, MD;  Location: [facility] ENDOSCOPY;  Service: Cardiovascular;  Laterality: N/A;  . COLONOSCOPY    . CORONARY ARTERY BYPASS GRAFT  2000   CABG X5  . ESOPHAGOGASTRODUODENOSCOPY    . HEMORRHOID BANDING    . INTRAVASCULAR PRESSURE WIRE/FFR STUDY N/A 01/05/2018   Procedure: INTRAVASCULAR PRESSURE WIRE/FFR STUDY;  Surgeon: Sherren Mocha, MD;  Location: [place] CV LAB;  Service: Cardiovascular;  Laterality: N/A;  . LEFT HEART CATH AND CORS/GRAFTS ANGIOGRAPHY N/A 01/05/2018   Procedure: LEFT HEART CATH AND CORS/GRAFTS ANGIOGRAPHY;  Surgeon: Sherren Mocha, MD;  Location: [place] CV LAB;  Service: Cardiovascular;  Laterality: N/A;  . PERMANENT PACEMAKER INSERTION N/A 07/15/2012   Procedure: PERMANENT PACEMAKER INSERTION;  Surgeon: Deboraha Sprang, MD;  Location: [facility]  CATH LAB;  Service: Cardiovascular;  Laterality: N/A;  . rotator cuff surgery Right 2017  . TONSILLECTOMY  1956    Current Outpatient Medications  Medication Sig Dispense Refill  . acetaminophen (TYLENOL) 500 MG tablet Take 1,000 mg by mouth every 6 (six) hours as needed for moderate pain or headache.    Marland Venditto atorvastatin (LIPITOR) 20 MG tablet Take 20 mg by mouth every evening.     . Emollient (CETAPHIL EX) Apply 1 application topically daily as needed (rash). Compounded with triamcinolone cream    . Multiple Vitamin (MULTIVITAMIN) tablet Take 1 tablet by mouth daily with supper.     . pantoprazole (PROTONIX) 40 MG tablet TAKE 1 TABLET ONCE DAILY. (Patient taking differently: Take 40 mg by mouth daily. ) 30 tablet 11  . polyvinyl alcohol (LIQUIFILM TEARS) 1.4 % ophthalmic solution Place 1 drop into both eyes every 8 (eight) hours as needed for dry eyes.    . ramipril (ALTACE) 2.5 MG capsule Take 2.5 mg by mouth every evening.     . rivaroxaban (XARELTO) 20 MG TABS tablet Take 20 mg by mouth daily with supper.    . sodium chloride (OCEAN) 0.65 % SOLN nasal spray Place 1 spray into both nostrils as needed for congestion.     No current facility-administered medications for this encounter.     Allergies  Allergen Reactions  . Cayenne Other (See Comments)    Sweats w/paprika too  . Niacin And Related Other (See Comments)    Upset stomach  . Sulfonamide Derivatives Other (See Comments)    "have no idea; mother told me I was allergic to"    Social History   Socioeconomic History  . Marital status: Married    Spouse name: Not on file  . Number of children: 2  . Years of education: 23  . Highest education level: Not on file  Occupational History  . Occupation: Technical brewer: Engineer, mining    Comment: [employer]  Social Needs  . Financial resource strain: Not on file  . Food insecurity:    Worry: Not on file    Inability: Not on file  .  Transportation needs:    Medical: Not on file    Non-medical: Not on file  Tobacco Use  . Smoking status: Never Smoker  . Smokeless tobacco: Never Used  Substance and Sexual Activity  . Alcohol use: Yes    Alcohol/week: 21.0 standard drinks    Types: 21 Glasses of wine per week  . Drug use: No  . Sexual activity: Yes    Partners: Female  Lifestyle  . Physical activity:    Days per week: Not on file    Minutes per session: Not on file  . Stress: Not on file  Relationships  . Social connections:    Talks on phone: Not on file    Gets together: Not on file    Attends religious service: Not on file    Active member of club or organization: Not on file  Attends meetings of clubs or organizations: Not on file    Relationship status: Not on file  . Intimate partner violence:    Fear of current or ex partner: Not on file    Emotionally abused: Not on file    Physically abused: Not on file    Forced sexual activity: Not on file  Other Topics Concern  . Not on file  Social History Narrative   [employer] [place], [place].  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  2 daughters-'75, '79; 1 grandchild; step-daughter and step grandson.  Regular exercise-yes, runs 1.5-2 mi 4x/wk, also eliptical      Patient signed a Designated Party Release to allow his wife, Russell Griffith, to have access to his medical records/ information.     ROS- All systems are reviewed and negative except as per the HPI above.  Physical Exam: Vitals:   01/27/19 1359  BP: 136/72  Pulse: 72  SpO2: 97%  Weight: 91.5 kg  Height: '5\' 11"'  (1.803 m)    GEN- The patient is well appearing, alert and oriented x 3 today.   Head- normocephalic, atraumatic Eyes-  Sclera clear, conjunctiva pink Ears- hearing intact Oropharynx- clear Neck- supple  Lungs- Clear to ausculation bilaterally, normal work of breathing Heart- Regular rate and  rhythm, no murmurs, rubs or gallops  GI- soft, NT, ND, + BS Extremities- no clubbing, cyanosis, or edema MS- no significant deformity or atrophy Skin- ecchymosis and hematoma R groin.  Psych- euthymic mood, full affect Neuro- strength and sensation are intact  Wt Readings from Last 3 Encounters:  01/27/19 91.5 kg  01/25/19 87.1 kg  12/16/18 89.4 kg    EKG is not ordered.  Echo 11/11/18 demonstrated  - Left ventricle: The cavity size was normal. There was mild focal   basal hypertrophy of the septum. Systolic function was normal.   The estimated ejection fraction was in the range of 60% to 65%.   Wall motion was normal; there were no regional wall motion   abnormalities. The study is not technically sufficient to allow   evaluation of LV diastolic function. - Aortic valve: Trileaflet; mildly thickened, mildly calcified   leaflets. There was mild stenosis. There was mild regurgitation.   Valve area (VTI): 1.47 cm^2. - Mitral valve: There was mild regurgitation. - Left atrium: The atrium was mildly dilated. - Right ventricle: Pacer wire or catheter noted in right ventricle. - Tricuspid valve: There was mild regurgitation. - Pulmonary arteries: Systolic pressure was moderately increased.   PA peak pressure: 52 mm Hg (S).  Epic records are reviewed at length today  Assessment and Plan:  1. Persistent atrial fibrillation The patient persistent atrial fibrillation.  S/p repeat ablation with Dr Rayann Heman 01/25/19. No chest pain or swallowing issues. Continue Xarelto 20 mg daily. See plans under hematoma below.  This patients CHA2DS2-VASc Score and unadjusted Ischemic Stroke Rate (% per year) is equal to 3.2 % stroke rate/year from a score of 3  Above score calculated as 1 point each if present [CHF, HTN, DM, Vascular=MI/PAD/Aortic Plaque, Age if 65-74, or Male] Above score calculated as 2 points each if present [Age > 75, or Stroke/TIA/TE]  2. Sinus node dysfunction S/p PPM.  Followed by device clinic and Dr Caryl Comes.  3. CAD S/p CABG. No anginal symptoms.  4. HTN Stable, no changes today.  5. Groin hematoma Right groin seen and examined with Dr Haroldine Laws.  Groin U/S ordered. To be  reviewed by Dr Donzetta Matters Check CBC. Continue anticoagulation.   Follow up with Dr Rayann Heman next week.  [facility] [address] [place],  [address] [phone] 01/27/2019 2:28 PM

## 2019-01-27 NOTE — Progress Notes (Signed)
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  Refer to "CV Proc" under chart review to view preliminary results. Study performed with Drs. Cain and [place].   01/27/2019 3:41 PM Maudry Mayhew, MHA, RVT, RDCS, RDMS

## 2019-01-28 NOTE — Consult Note (Signed)
Outpatient Consult    Reason for Consult: Possible right groin pseudoaneurysm Referring Physician: Dr. Ronna Polio MRN #:  [id number]  History of Present Illness: This is a 73 y.o. male underwent atrial fib ablation with Dr. Rayann Heman having had previously undergone ablation in [place].  Since that time he is developed hematoma in his right groin without any drainage and minimal pain.  He continues to be able to walk.  Has not had any acute swelling of the groin.  Here today for concern of groin pseudoaneurysm undergoing duplex at the time that I was called.  Past Medical History:  Diagnosis Date  . Aortic stenosis    Mild, echo, April, 2014  . BPH (benign prostatic hyperplasia)   . CAD (coronary artery disease)    a. s/p CABG  . Carotid artery disease ([place])   . Colonic polyp   . Diverticulosis   . Elevated bilirubin    Mild chronic elevation, 2.0 January, 2011 stable  . GERD (gastroesophageal reflux disease)    Barrett's esophagus  . HTN (hypertension)   . Hyperlipidemia    Low HDL  . 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 Persistent atrial fibrillation    a. s/p PVI at [facility]  . Primary osteoarthritis of left knee    Mild  . Prolapsed internal hemorrhoids, grade 3 08/12/2015  . PVC's (premature ventricular contractions)   . Tubular adenoma of colon     Past Surgical History:  Procedure Laterality Date  . ABLATION     PVI at [facility]  . ATRIAL FIBRILLATION ABLATION N/A 01/25/2019   Procedure: ATRIAL FIBRILLATION ABLATION;  Surgeon: [name], MD;  Location: [place] CV LAB;  Service: Cardiovascular;  Laterality: N/A;  . CARDIOVERSION N/A 10/14/2018   Procedure: CARDIOVERSION;  Surgeon: Fay Records, MD;  Location: [facility] ENDOSCOPY;  Service: Cardiovascular;  Laterality: N/A;  . COLONOSCOPY    . CORONARY ARTERY BYPASS GRAFT  2000   CABG X5  . ESOPHAGOGASTRODUODENOSCOPY    . HEMORRHOID BANDING    . INTRAVASCULAR PRESSURE WIRE/FFR STUDY N/A  01/05/2018   Procedure: INTRAVASCULAR PRESSURE WIRE/FFR STUDY;  Surgeon: Sherren Mocha, MD;  Location: [place] CV LAB;  Service: Cardiovascular;  Laterality: N/A;  . LEFT HEART CATH AND CORS/GRAFTS ANGIOGRAPHY N/A 01/05/2018   Procedure: LEFT HEART CATH AND CORS/GRAFTS ANGIOGRAPHY;  Surgeon: Sherren Mocha, MD;  Location: [place] CV LAB;  Service: Cardiovascular;  Laterality: N/A;  . PERMANENT PACEMAKER INSERTION N/A 07/15/2012   Procedure: PERMANENT PACEMAKER INSERTION;  Surgeon: Deboraha Sprang, MD;  Location: [facility] CATH LAB;  Service: Cardiovascular;  Laterality: N/A;  . rotator cuff surgery Right 2017  . TONSILLECTOMY  1956    Allergies  Allergen Reactions  . Cayenne Other (See Comments)    Sweats w/paprika too  . Niacin And Related Other (See Comments)    Upset stomach  . Sulfonamide Derivatives Other (See Comments)    "have no idea; mother told me I was allergic to"    Prior to Admission medications   Medication Sig Start Date End Date Taking? Authorizing Provider  acetaminophen (TYLENOL) 500 MG tablet Take 1,000 mg by mouth every 6 (six) hours as needed for moderate pain or headache.    [provider]  atorvastatin (LIPITOR) 20 MG tablet Take 20 mg by mouth every evening.     [provider]  Emollient (CETAPHIL EX) Apply 1 application topically daily as needed (rash). Compounded with triamcinolone cream  [provider]  Multiple Vitamin (MULTIVITAMIN) tablet Take 1 tablet by mouth daily with supper.     [provider]  pantoprazole (PROTONIX) 40 MG tablet TAKE 1 TABLET ONCE DAILY. Patient taking differently: Take 40 mg by mouth daily.  12/26/18   Milus Banister, MD  polyvinyl alcohol (LIQUIFILM TEARS) 1.4 % ophthalmic solution Place 1 drop into both eyes every 8 (eight) hours as needed for dry eyes.    [provider]  ramipril (ALTACE) 2.5 MG capsule Take 2.5 mg by mouth every evening.     [provider]    rivaroxaban (XARELTO) 20 MG TABS tablet Take 20 mg by mouth daily with supper.    [provider]  sodium chloride (OCEAN) 0.65 % SOLN nasal spray Place 1 spray into both nostrils as needed for congestion.    [provider]    Social History   Socioeconomic History  . Marital status: Married    Spouse name: Not on file  . Number of children: 2  . Years of education: 39  . Highest education level: Not on file  Occupational History  . Occupation: Technical brewer: Engineer, mining    Comment: [employer]  Social Needs  . Financial resource strain: Not on file  . Food insecurity:    Worry: Not on file    Inability: Not on file  . Transportation needs:    Medical: Not on file    Non-medical: Not on file  Tobacco Use  . Smoking status: Never Smoker  . Smokeless tobacco: Never Used  Substance and Sexual Activity  . Alcohol use: Yes    Alcohol/week: 21.0 standard drinks    Types: 21 Glasses of wine per week  . Drug use: No  . Sexual activity: Yes    Partners: Female  Lifestyle  . Physical activity:    Days per week: Not on file    Minutes per session: Not on file  . Stress: Not on file  Relationships  . Social connections:    Talks on phone: Not on file    Gets together: Not on file    Attends religious service: Not on file    Active member of club or organization: Not on file    Attends meetings of clubs or organizations: Not on file    Relationship status: Not on file  . Intimate partner violence:    Fear of current or ex partner: Not on file    Emotionally abused: Not on file    Physically abused: Not on file    Forced sexual activity: Not on file  Other Topics Concern  . Not on file  Social History Narrative   [employer] [place], [place].  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  2 daughters-'75, '79; 1 grandchild; step-daughter and step  grandson.  Regular exercise-yes, runs 1.5-2 mi 4x/wk, also eliptical      Patient signed a Designated Party Release to allow his wife, Ozzy Bohlken, to have access to his medical records/ information.    Pending Family History  Problem Relation Age of Onset  . Coronary artery disease Other 70  . Diabetes Other   . Multiple myeloma Mother   . Diabetes Father   . Renal Disease Father   . Hyperlipidemia Other   . Hypertension Other   . Colon cancer Neg Hx   . Esophageal cancer Neg Hx   . Stomach  cancer Neg Hx   . Rectal cancer Neg Hx     ROS: Right groin hematoma and swelling  Physical Examination Vitals were monitored as he was undergoing duplex  General:  WDWN in NAD Gait: Not observed Pulmonary: normal non-labored breathing Cardiac: Regular rate and rhythm Abdomen: Soft nontender Extremities: Right groin with hematoma, feet are well-perfused  Neurologic: A&O X 3 SENSATION: normal; MOTOR FUNCTION:  moving all extremities equally. Speech is fluent/normal Psychiatric: Appropriate mood and affect  CBC    Component Value Date/Time   WBC 7.9 01/27/2019 1523   RBC 4.68 01/27/2019 1523   HGB 13.6 01/27/2019 1523   HGB 14.5 01/20/2019 1027   HCT 42.0 01/27/2019 1523   HCT 43.2 01/20/2019 1027   PLT 155 01/27/2019 1523   PLT 166 01/20/2019 1027   MCV 89.7 01/27/2019 1523   MCV 89 01/20/2019 1027   MCH 29.1 01/27/2019 1523   MCHC 32.4 01/27/2019 1523   RDW 14.0 01/27/2019 1523   RDW 13.5 01/20/2019 1027   LYMPHSABS 1.6 01/20/2019 1027   MONOABS 0.9 08/18/2018 0853   EOSABS 0.1 01/20/2019 1027   BASOSABS 0.0 01/20/2019 1027    BMET    Component Value Date/Time   NA 143 01/20/2019 1027   K 4.4 01/20/2019 1027   CL 104 01/20/2019 1027   CO2 24 01/20/2019 1027   GLUCOSE 94 01/20/2019 1027   GLUCOSE 104 (H) 08/18/2018 0853   BUN 22 01/20/2019 1027   CREATININE 1.17 01/20/2019 1027   CREATININE 1.12 07/13/2012 1700   CALCIUM 9.8 01/20/2019 1027   GFRNONAA 62  01/20/2019 1027   GFRAA 72 01/20/2019 1027    COAGS: Lab Results  Component Value Date   INR 1.53 (H) 07/15/2012   INR 1.95 (H) 07/13/2012   INR 2.7 06/15/2012   PROTIME 18.8 05/21/2009     Non-Invasive Vascular Imaging:   An avascular heterogenous area at the right groin (area of concern) is visualized measuring 2.6 x 1.2 x 4 cm, suggestive of hematoma. Additionally, there is a branch arising from the anterior aspect of the right common femoral artery, feeding multiple smaller vessels with a to-fro waveform. This is suggestive of a possible pseudoaneurysm versus unknown etiology. Right common femoral vein is patent with slightly pulsatile flow.  I was present for part of this evaluation and independently reviewed the images which suggest possibly a small pseudoaneurysm from a branch vessel that appears clinically insignificant.  There is possibly a small AV fistula given the pulsatility in the right common femoral vein.   ASSESSMENT/PLAN: This is a 73 y.o. male status post ablation from right common femoral vein approach.  He has hematoma with possible small pseudoaneurysm versus AV fistula.  He is anticoagulated which does complicate issues.  I recommended to him and his team that we proceed with watchful waiting and this has been communicated with Dr. Rayann Heman.  All parties involved appear comfortable with this plan.  If he has any worsening symptoms I will be happy to see him again likely this will resolve without any further intervention.  Lisbet Busker C. Donzetta Matters, MD [facility] Office: [phone] Pager: [phone]

## 2019-01-30 ENCOUNTER — Ambulatory Visit (INDEPENDENT_AMBULATORY_CARE_PROVIDER_SITE_OTHER): Payer: PPO | Admitting: Internal Medicine

## 2019-01-30 ENCOUNTER — Encounter: Payer: Self-pay | Admitting: Internal Medicine

## 2019-01-30 VITALS — BP 134/78 | HR 71 | Ht 71.0 in | Wt 200.6 lb

## 2019-01-30 DIAGNOSIS — S301XXS Contusion of abdominal wall, sequela: Secondary | ICD-10-CM

## 2019-01-30 DIAGNOSIS — I259 Chronic ischemic heart disease, unspecified: Secondary | ICD-10-CM

## 2019-01-30 DIAGNOSIS — I495 Sick sinus syndrome: Secondary | ICD-10-CM

## 2019-01-30 DIAGNOSIS — Z950 Presence of cardiac pacemaker: Secondary | ICD-10-CM

## 2019-01-30 DIAGNOSIS — I4819 Other persistent atrial fibrillation: Secondary | ICD-10-CM

## 2019-01-30 NOTE — Progress Notes (Signed)
PCP: Cassandria Anger, MD Primary Cardiologist: Dr Percival Spanish Primary EP: Dr Vonita Moss Damel Russell is a 73 y.o. male who presents today for electrophysiology followup post ablation.   He has developed a moderate sized hematoma.  Pain is ok.  He feels that the hematoma has not progressed over the weekend.  Today, he denies symptoms of palpitations, chest pain, shortness of breath.  The patient is otherwise without complaint today.   Past Medical History:  Diagnosis Date  . Aortic stenosis    Mild, echo, April, 2014  . BPH (benign prostatic hyperplasia)   . CAD (coronary artery disease)    a. s/p CABG  . Carotid artery disease ([place])   . Colonic polyp   . Diverticulosis   . Elevated bilirubin    Mild chronic elevation, 2.0 January, 2011 stable  . GERD (gastroesophageal reflux disease)    Barrett's esophagus  . HTN (hypertension)   . Hyperlipidemia    Low HDL  . 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 Persistent atrial fibrillation    a. s/p PVI at [facility]  . Primary osteoarthritis of left knee    Mild  . Prolapsed internal hemorrhoids, grade 3 08/12/2015  . PVC's (premature ventricular contractions)   . Tubular adenoma of colon    Past Surgical History:  Procedure Laterality Date  . ABLATION     PVI at [facility]  . ATRIAL FIBRILLATION ABLATION N/A 01/25/2019   Procedure: ATRIAL FIBRILLATION ABLATION;  Surgeon: [name], MD;  Location: [place] CV LAB;  Service: Cardiovascular;  Laterality: N/A;  . CARDIOVERSION N/A 10/14/2018   Procedure: CARDIOVERSION;  Surgeon: Fay Records, MD;  Location: [facility] ENDOSCOPY;  Service: Cardiovascular;  Laterality: N/A;  . COLONOSCOPY    . CORONARY ARTERY BYPASS GRAFT  2000   CABG X5  . ESOPHAGOGASTRODUODENOSCOPY    . HEMORRHOID BANDING    . INTRAVASCULAR PRESSURE WIRE/FFR STUDY N/A 01/05/2018   Procedure: INTRAVASCULAR PRESSURE WIRE/FFR STUDY;  Surgeon: Sherren Mocha, MD;  Location: [place] CV LAB;   Service: Cardiovascular;  Laterality: N/A;  . LEFT HEART CATH AND CORS/GRAFTS ANGIOGRAPHY N/A 01/05/2018   Procedure: LEFT HEART CATH AND CORS/GRAFTS ANGIOGRAPHY;  Surgeon: Sherren Mocha, MD;  Location: [place] CV LAB;  Service: Cardiovascular;  Laterality: N/A;  . PERMANENT PACEMAKER INSERTION N/A 07/15/2012   Procedure: PERMANENT PACEMAKER INSERTION;  Surgeon: Deboraha Sprang, MD;  Location: [facility] CATH LAB;  Service: Cardiovascular;  Laterality: N/A;  . rotator cuff surgery Right 2017  . TONSILLECTOMY  1956    ROS- all systems are reviewed and negatives except as per HPI above  Current Outpatient Medications  Medication Sig Dispense Refill  . acetaminophen (TYLENOL) 500 MG tablet Take 1,000 mg by mouth every 6 (six) hours as needed for moderate pain or headache.    [name] atorvastatin (LIPITOR) 20 MG tablet Take 20 mg by mouth every evening.     . Emollient (CETAPHIL EX) Apply 1 application topically daily as needed (rash). Compounded with triamcinolone cream    . Multiple Vitamin (MULTIVITAMIN) tablet Take 1 tablet by mouth daily with supper.     . pantoprazole (PROTONIX) 40 MG tablet TAKE 1 TABLET ONCE DAILY. (Patient taking differently: Take 40 mg by mouth daily. ) 30 tablet 11  . polyvinyl alcohol (LIQUIFILM TEARS) 1.4 % ophthalmic solution Place 1 drop into both eyes every 8 (eight) hours as needed for dry eyes.    . ramipril (ALTACE)  2.5 MG capsule Take 2.5 mg by mouth every evening.     . rivaroxaban (XARELTO) 20 MG TABS tablet Take 20 mg by mouth daily with supper.    . sodium chloride (OCEAN) 0.65 % SOLN nasal spray Place 1 spray into both nostrils as needed for congestion.     No current facility-administered medications for this visit.     Physical Exam: Vitals:   01/30/19 0933  BP: 134/78  Pulse: 71  SpO2: 96%  Weight: 200 lb 9.6 oz (91 kg)  Height: 5\' 11"  (1.803 m)    GEN- The patient is well appearing, alert and oriented x 3 today.   Heart- Regular rate and rhythm, no  murmurs, rubs or gallops, Extremities- moderate to large R groin hematoma, no bruit, pulses are normal  Wt Readings from Last 3 Encounters:  01/30/19 200 lb 9.6 oz (91 kg)  01/27/19 201 lb 12.8 oz (91.5 kg)  01/25/19 192 lb (87.1 kg)     Assessment and Plan:  1. R groin hematoma Clinically stable Supportive care discussed Return in 1 week to see me  2. afib Maintaining sinus rhythm  Return in 1 week   [name] MD, [facility] 01/30/2019 9:41 AM

## 2019-01-30 NOTE — Patient Instructions (Addendum)
Medication Instructions:  Your physician recommends that you continue on your current medications as directed. Please refer to the Current Medication list given to you today.  Labwork: None ordered.  Testing/Procedures: None ordered.  Follow-Up: Your physician wants you to follow-up in: one week with Dr. Rayann Heman.  February 06, 2019 at 9:15 am   Any Other Special Instructions Will Be Listed Below (If Applicable).  If you need a refill on your cardiac medications before your next appointment, please call your pharmacy.

## 2019-02-06 ENCOUNTER — Ambulatory Visit (INDEPENDENT_AMBULATORY_CARE_PROVIDER_SITE_OTHER): Payer: PPO | Admitting: Internal Medicine

## 2019-02-06 ENCOUNTER — Encounter: Payer: Self-pay | Admitting: Internal Medicine

## 2019-02-06 VITALS — BP 122/72 | HR 71 | Ht 71.0 in | Wt 198.0 lb

## 2019-02-06 DIAGNOSIS — Z9889 Other specified postprocedural states: Secondary | ICD-10-CM

## 2019-02-06 DIAGNOSIS — I495 Sick sinus syndrome: Secondary | ICD-10-CM

## 2019-02-06 DIAGNOSIS — Z950 Presence of cardiac pacemaker: Secondary | ICD-10-CM

## 2019-02-06 DIAGNOSIS — Z8679 Personal history of other diseases of the circulatory system: Secondary | ICD-10-CM

## 2019-02-06 DIAGNOSIS — I4819 Other persistent atrial fibrillation: Secondary | ICD-10-CM

## 2019-02-06 NOTE — Patient Instructions (Signed)
Medication Instructions:  Your physician recommends that you continue on your current medications as directed. Please refer to the Current Medication list given to you today.  Labwork: None ordered.  Testing/Procedures: None ordered.  Follow-Up: Your physician recommends that you schedule a follow-up appointment as planned with the [facility]  Any Other Special Instructions Will Be Listed Below (If Applicable).     If you need a refill on your cardiac medications before your next appointment, please call your pharmacy.

## 2019-02-06 NOTE — Progress Notes (Signed)
PCP: Cassandria Anger, MD Primary Cardiologist: Dr Percival Spanish Primary EP: Dr Vonita Moss Russell Griffith is a 73 y.o. male who presents today for electrophysiology followup post ablation.   His hematoma appears slightly improved.  Pain is better.  He ha had rare palpitations. Today, he denies symptoms of chest pain, shortness of breath.  The patient is otherwise without complaint today.   Past Medical History:  Diagnosis Date  . Aortic stenosis    Mild, echo, April, 2014  . BPH (benign prostatic hyperplasia)   . CAD (coronary artery disease)    a. s/p CABG  . Carotid artery disease ([place])   . Colonic polyp   . Diverticulosis   . Elevated bilirubin    Mild chronic elevation, 2.0 January, 2011 stable  . GERD (gastroesophageal reflux disease)    Barrett's esophagus  . HTN (hypertension)   . Hyperlipidemia    Low HDL  . 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 Persistent atrial fibrillation    a. s/p PVI at [facility]  . Primary osteoarthritis of left knee    Mild  . Prolapsed internal hemorrhoids, grade 3 08/12/2015  . PVC's (premature ventricular contractions)   . Tubular adenoma of colon    Past Surgical History:  Procedure Laterality Date  . ABLATION     PVI at [facility]  . ATRIAL FIBRILLATION ABLATION N/A 01/25/2019   Procedure: ATRIAL FIBRILLATION ABLATION;  Surgeon: [name], MD;  Location: [place] CV LAB;  Service: Cardiovascular;  Laterality: N/A;  . CARDIOVERSION N/A 10/14/2018   Procedure: CARDIOVERSION;  Surgeon: Fay Records, MD;  Location: [facility] ENDOSCOPY;  Service: Cardiovascular;  Laterality: N/A;  . COLONOSCOPY    . CORONARY ARTERY BYPASS GRAFT  2000   CABG X5  . ESOPHAGOGASTRODUODENOSCOPY    . HEMORRHOID BANDING    . INTRAVASCULAR PRESSURE WIRE/FFR STUDY N/A 01/05/2018   Procedure: INTRAVASCULAR PRESSURE WIRE/FFR STUDY;  Surgeon: Sherren Mocha, MD;  Location: [place] CV LAB;  Service: Cardiovascular;  Laterality: N/A;  . LEFT HEART  CATH AND CORS/GRAFTS ANGIOGRAPHY N/A 01/05/2018   Procedure: LEFT HEART CATH AND CORS/GRAFTS ANGIOGRAPHY;  Surgeon: Sherren Mocha, MD;  Location: [place] CV LAB;  Service: Cardiovascular;  Laterality: N/A;  . PERMANENT PACEMAKER INSERTION N/A 07/15/2012   Procedure: PERMANENT PACEMAKER INSERTION;  Surgeon: Deboraha Sprang, MD;  Location: [facility] CATH LAB;  Service: Cardiovascular;  Laterality: N/A;  . rotator cuff surgery Right 2017  . TONSILLECTOMY  1956    ROS- all systems are reviewed and negatives except as per HPI above  Current Outpatient Medications  Medication Sig Dispense Refill  . acetaminophen (TYLENOL) 500 MG tablet Take 1,000 mg by mouth every 6 (six) hours as needed for moderate pain or headache.    Russell Griffith atorvastatin (LIPITOR) 20 MG tablet Take 20 mg by mouth every evening.     . Emollient (CETAPHIL EX) Apply 1 application topically daily as needed (rash). Compounded with triamcinolone cream    . Multiple Vitamin (MULTIVITAMIN) tablet Take 1 tablet by mouth daily with supper.     . pantoprazole (PROTONIX) 40 MG tablet TAKE 1 TABLET ONCE DAILY. (Patient taking differently: Take 40 mg by mouth daily. ) 30 tablet 11  . polyvinyl alcohol (LIQUIFILM TEARS) 1.4 % ophthalmic solution Place 1 drop into both eyes every 8 (eight) hours as needed for dry eyes.    . ramipril (ALTACE) 2.5 MG capsule Take 2.5 mg by mouth every evening.     Russell Griffith  rivaroxaban (XARELTO) 20 MG TABS tablet Take 20 mg by mouth daily with supper.    . sodium chloride (OCEAN) 0.65 % SOLN nasal spray Place 1 spray into both nostrils as needed for congestion.     No current facility-administered medications for this visit.     Physical Exam: Vitals:   02/06/19 0914  BP: 122/72  Pulse: 71  SpO2: 97%  Weight: 198 lb (89.8 kg)  Height: 5\' 11"  (1.803 m)    GEN- The patient is well appearing, alert and oriented x 3 today.   Heart- Regular rate and rhythm, no murmurs, rubs or gallops, Extremities- moderate R groin  hematoma, no bruit, pulses are normal.  Hematoma is improved.  Ecchymosis again noted  Wt Readings from Last 3 Encounters:  02/06/19 198 lb (89.8 kg)  01/30/19 200 lb 9.6 oz (91 kg)  01/27/19 201 lb 12.8 oz (91.5 kg)     Assessment and Plan:  1. R groin hematoma Clinically improving slowly Supportive care discussed Could consider repeat US in 4-6 weeks, though I doubt this would change management  2. afib Maintaining sinus rhythm  Return to AF clinic as scheduled.  I will see as scheduled in May.  [name] MD, [facility] 02/06/2019 9:42 AM

## 2019-02-17 ENCOUNTER — Encounter (HOSPITAL_COMMUNITY): Payer: Self-pay

## 2019-02-20 ENCOUNTER — Ambulatory Visit (HOSPITAL_COMMUNITY): Payer: PPO | Admitting: Nurse Practitioner

## 2019-02-20 ENCOUNTER — Ambulatory Visit (HOSPITAL_COMMUNITY)
Admission: RE | Admit: 2019-02-20 | Discharge: 2019-02-20 | Disposition: A | Discharge status: Home / Self Care | Payer: PPO | Source: Ambulatory Visit | Attending: Nurse Practitioner | Admitting: Nurse Practitioner

## 2019-02-20 ENCOUNTER — Other Ambulatory Visit: Payer: Self-pay

## 2019-02-20 VITALS — BP 136/77 | HR 70

## 2019-02-20 DIAGNOSIS — I4819 Other persistent atrial fibrillation: Secondary | ICD-10-CM

## 2019-02-21 ENCOUNTER — Encounter (HOSPITAL_COMMUNITY): Payer: Self-pay | Admitting: Physician Assistant

## 2019-02-21 NOTE — Progress Notes (Signed)
Electrophysiology TeleHealth Note   Due to national recommendations of social distancing due to [place] 19, Audio/video telehealth visit is felt to be most appropriate for this patient at this time.  See MyChart message/consent below from today for patient consent regarding telehealth for the Atrial Fibrillation Clinic.    Date:  02/21/2019   ID:  Russell Griffith, DOB 08/11/1946, MRN [id number]  Location: home  Provider location: [address] [place], [place] [address] Evaluation Performed: Follow up visit.  PCP:  Cassandria Anger, MD  Primary Cardiologist:  Dr Percival Spanish Primary Electrophysiologist: Dr Caryl Comes   CC: Follow up for atrial fibrillation and groin hematoma   History of Present Illness: Russell Griffith is a 73 y.o. male who presents via audio/video conferencing for a telehealth visit today.  Patient reports that he is doing well with no symptoms of afib. He also reports that his right groin hematoma continues to get smaller and the bruising has resolved. No pain or tenderness at the site.  Today, he denies symptoms of palpitations, chest pain, shortness of breath, orthopnea, PND, lower extremity edema, claudication, dizziness, presyncope, syncope, bleeding, or neurologic sequela. The patient is tolerating medications without difficulties and is otherwise without complaint today.   he denies symptoms of cough, fevers, chills, or new SOB worrisome for COVID 19.     Atrial Fibrillation Risk Factors:  he does not have symptoms or diagnosis of sleep apnea. he does not have a history of rheumatic fever. he does not have a history of alcohol use. The patient does not have a history of early familial atrial fibrillation or other arrhythmias.  he has a BMI of There is no height or weight on file to calculate BMI.. There were no vitals filed for this visit.  Past Medical History:  Diagnosis Date  . Aortic stenosis    Mild, echo, April, 2014  . BPH (benign  prostatic hyperplasia)   . CAD (coronary artery disease)    a. s/p CABG  . Carotid artery disease ([place])   . Colonic polyp   . Diverticulosis   . Elevated bilirubin    Mild chronic elevation, 2.0 January, 2011 stable  . GERD (gastroesophageal reflux disease)    Barrett's esophagus  . HTN (hypertension)   . Hyperlipidemia    Low HDL  . 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 Persistent atrial fibrillation    a. s/p PVI at [facility]  . Primary osteoarthritis of left knee    Mild  . Prolapsed internal hemorrhoids, grade 3 08/12/2015  . PVC's (premature ventricular contractions)   . Tubular adenoma of colon    Past Surgical History:  Procedure Laterality Date  . ABLATION     PVI at [facility]  . ATRIAL FIBRILLATION ABLATION N/A 01/25/2019   Procedure: ATRIAL FIBRILLATION ABLATION;  Surgeon: [name], MD;  Location: [place] CV LAB;  Service: Cardiovascular;  Laterality: N/A;  . CARDIOVERSION N/A 10/14/2018   Procedure: CARDIOVERSION;  Surgeon: Fay Records, MD;  Location: [facility] ENDOSCOPY;  Service: Cardiovascular;  Laterality: N/A;  . COLONOSCOPY    . CORONARY ARTERY BYPASS GRAFT  2000   CABG X5  . ESOPHAGOGASTRODUODENOSCOPY    . HEMORRHOID BANDING    . INTRAVASCULAR PRESSURE WIRE/FFR STUDY N/A 01/05/2018   Procedure: INTRAVASCULAR PRESSURE WIRE/FFR STUDY;  Surgeon: Sherren Mocha, MD;  Location: [place] CV LAB;  Service: Cardiovascular;  Laterality: N/A;  . LEFT HEART CATH AND CORS/GRAFTS  ANGIOGRAPHY N/A 01/05/2018   Procedure: LEFT HEART CATH AND CORS/GRAFTS ANGIOGRAPHY;  Surgeon: Sherren Mocha, MD;  Location: [place] CV LAB;  Service: Cardiovascular;  Laterality: N/A;  . PERMANENT PACEMAKER INSERTION N/A 07/15/2012   Procedure: PERMANENT PACEMAKER INSERTION;  Surgeon: Deboraha Sprang, MD;  Location: [facility] CATH LAB;  Service: Cardiovascular;  Laterality: N/A;  . rotator cuff surgery Right 2017  . TONSILLECTOMY  1956     Current Outpatient Medications   Medication Sig Dispense Refill  . acetaminophen (TYLENOL) 500 MG tablet Take 1,000 mg by mouth every 6 (six) hours as needed for moderate pain or headache.    Marland Jarvie atorvastatin (LIPITOR) 20 MG tablet Take 20 mg by mouth every evening.     . Emollient (CETAPHIL EX) Apply 1 application topically daily as needed (rash). Compounded with triamcinolone cream    . Multiple Vitamin (MULTIVITAMIN) tablet Take 1 tablet by mouth daily with supper.     . pantoprazole (PROTONIX) 40 MG tablet TAKE 1 TABLET ONCE DAILY. (Patient taking differently: Take 40 mg by mouth daily. ) 30 tablet 11  . polyvinyl alcohol (LIQUIFILM TEARS) 1.4 % ophthalmic solution Place 1 drop into both eyes every 8 (eight) hours as needed for dry eyes.    . ramipril (ALTACE) 2.5 MG capsule Take 2.5 mg by mouth every evening.     . rivaroxaban (XARELTO) 20 MG TABS tablet Take 20 mg by mouth daily with supper.    . sodium chloride (OCEAN) 0.65 % SOLN nasal spray Place 1 spray into both nostrils as needed for congestion.     No current facility-administered medications for this encounter.     Allergies:   Cayenne; Niacin and related; and Sulfonamide derivatives   Social History:  The patient  reports that he has never smoked. He has never used smokeless tobacco. He reports current alcohol use of about 21.0 standard drinks of alcohol per week. He reports that he does not use drugs.   Family History:  The patient's  family history includes Coronary artery disease (age of onset: 1) in an other family member; Diabetes in his father and another family member; Hyperlipidemia in an other family member; Hypertension in an other family member; Multiple myeloma in his mother; Renal Disease in his father.    ROS:  Please see the history of present illness.   All other systems are personally reviewed and negative.   Exam: Well appearing, alert and conversant, regular work of breathing,  good skin color  Recent Labs: 08/18/2018: ALT 32; TSH 4.61  01/20/2019: BUN 22; Creatinine, Ser 1.17; Potassium 4.4; Sodium 143 01/27/2019: Hemoglobin 13.6; Platelets 155  personally reviewed    Other studies personally reviewed: Additional studies/ records that were reviewed today include: Epic notes.   ASSESSMENT AND PLAN:  1. Persistent atrial fibrillation The patient persistent atrial fibrillation.  S/p repeat ablation with Dr Rayann Heman 01/25/19. Appears to be maintaining SR. No chest pain or swallowing issues. Continue Xarelto 20 mg daily.  This patients CHA2DS2-VASc Score and unadjusted Ischemic Stroke Rate (% per year) is equal to 3.2 % stroke rate/year from a score of 3  Above score calculated as 1 point each if present [CHF, HTN, DM, Vascular=MI/PAD/Aortic Plaque, Age if 65-74, or Male] Above score calculated as 2 points each if present [Age > 75, or Stroke/TIA/TE]  2. Sinus node dysfunction S/p PPM. Followed by device clinic and Dr Caryl Comes.   3. CAD S/p CABG. No anginal symptoms.  4. HTN Stable, no changes today.  5. Groin hematoma Right groin hematoma continues to improve. Swelling has decreased and area is non-tender.  Continue conservative management.   COVID screen The patient does not have any symptoms that suggest any further testing/ screening at this time.  Social distancing reinforced today.   Follow-up with Dr Rayann Heman as scheduled.  Current medicines are reviewed at length with the patient today.   The patient does not have concerns regarding his medicines.  The following changes were made today:  none  Labs/ tests ordered today include:  No orders of the defined types were placed in this encounter.   Patient Risk:  after full review of this patients clinical status, I feel that they are at moderate risk at this time.   Today, I have spent 13 minutes with the patient with telehealth technology discussing atrial fibrillation and groin hematomas.    Gwenlyn Perking PA-C 02/21/2019 8:39 AM  [facility] [address] [place],  [address] [phone]

## 2019-03-23 ENCOUNTER — Ambulatory Visit (INDEPENDENT_AMBULATORY_CARE_PROVIDER_SITE_OTHER): Payer: PPO | Admitting: *Deleted

## 2019-03-23 ENCOUNTER — Other Ambulatory Visit: Payer: Self-pay

## 2019-03-23 DIAGNOSIS — I495 Sick sinus syndrome: Secondary | ICD-10-CM

## 2019-03-23 LAB — CUP PACEART REMOTE DEVICE CHECK
Battery Remaining Longevity: 78 mo
Battery Remaining Percentage: 86 %
Brady Statistic RA Percent Paced: 93 %
Brady Statistic RV Percent Paced: 0 %
Date Time Interrogation Session: 20200423053300
Implantable Lead Implant Date: 20130816
Implantable Lead Implant Date: 20130816
Implantable Lead Location: 753859
Implantable Lead Location: 753860
Implantable Lead Model: 5076
Implantable Lead Model: 5076
Implantable Pulse Generator Implant Date: 20130816
Lead Channel Impedance Value: 548 Ohm
Lead Channel Impedance Value: 564 Ohm
Lead Channel Setting Pacing Amplitude: 2 V
Lead Channel Setting Sensing Sensitivity: 2.5 mV
Pulse Gen Serial Number: 111255

## 2019-03-31 ENCOUNTER — Encounter: Payer: Self-pay | Admitting: Cardiology

## 2019-03-31 NOTE — Progress Notes (Signed)
Remote pacemaker transmission.   

## 2019-04-12 ENCOUNTER — Telehealth: Payer: Self-pay

## 2019-04-18 NOTE — Telephone Encounter (Signed)
Spoke with pt regarding appt on 04/21/19. Pt stated he did not have any concerns regarding appt.

## 2019-04-18 NOTE — Telephone Encounter (Signed)
Left message regarding appt on 04/21/19.

## 2019-04-18 NOTE — Telephone Encounter (Signed)
Patient is returning your call.  

## 2019-04-21 ENCOUNTER — Telehealth (INDEPENDENT_AMBULATORY_CARE_PROVIDER_SITE_OTHER): Payer: PPO | Admitting: Internal Medicine

## 2019-04-21 ENCOUNTER — Encounter: Payer: Self-pay | Admitting: Internal Medicine

## 2019-04-21 VITALS — BP 123/78 | HR 70 | Ht 71.0 in | Wt 192.0 lb

## 2019-04-21 DIAGNOSIS — S301XXS Contusion of abdominal wall, sequela: Secondary | ICD-10-CM | POA: Diagnosis not present

## 2019-04-21 DIAGNOSIS — I4819 Other persistent atrial fibrillation: Secondary | ICD-10-CM

## 2019-04-21 DIAGNOSIS — Z950 Presence of cardiac pacemaker: Secondary | ICD-10-CM | POA: Diagnosis not present

## 2019-04-21 DIAGNOSIS — I495 Sick sinus syndrome: Secondary | ICD-10-CM

## 2019-04-21 NOTE — Progress Notes (Signed)
Electrophysiology TeleHealth Note   Due to national recommendations of social distancing due to COVID 19, an audio/video telehealth visit is felt to be most appropriate for this patient at this time.  See MyChart message from today for the patient's consent to telehealth for [facility].   Date:  04/21/2019   ID:  Russell Griffith, DOB 1946-05-15, MRN [id number]  Location: patient's home  Provider location: [place]  Evaluation Performed: Follow-up visit  PCP:  [name], Evie Lacks, MD  Primary Cardiology:  Dr Percival Spanish Electrophysiologist:  Dr Caryl Comes  Chief Complaint:  afib  History of Present Illness:    Russell Griffith is a 73 y.o. male who presents via audio/video conferencing for a telehealth visit today.  Since his ablation, the patient reports doing very well.  He did have a large hematoma initially which he feels has completely resolved.  Denies any other comlications and is pleased with the results of his procedure.  No symptoms of afib.  Today, he denies symptoms of palpitations, chest pain, shortness of breath,  lower extremity edema, dizziness, presyncope, or syncope.  The patient is otherwise without complaint today.  The patient denies symptoms of fevers, chills, cough, or new SOB worrisome for COVID 19.  Past Medical History:  Diagnosis Date  . Aortic stenosis    Mild, echo, April, 2014  . BPH (benign prostatic hyperplasia)   . CAD (coronary artery disease)    a. s/p CABG  . Carotid artery disease ([place])   . Colonic polyp   . Diverticulosis   . Elevated bilirubin    Mild chronic elevation, 2.0 January, 2011 stable  . GERD (gastroesophageal reflux disease)    Barrett's esophagus  . HTN (hypertension)   . Hyperlipidemia    Low HDL  . 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 Persistent atrial fibrillation    a. s/p [facility]VI at [facility]  . Primary osteoarthritis of left knee    Mild  . Prolapsed internal hemorrhoids, grade 3  08/12/2015  . PVC's (premature ventricular contractions)   . Tubular adenoma of colon     Past Surgical History:  Procedure Laterality Date  . ABLATION     PVI at [facility]  . ATRIAL FIBRILLATION ABLATION N/A 01/25/2019   Procedure: ATRIAL FIBRILLATION ABLATION;  Surgeon: [name], MD;  Location: [place] CV LAB;  Service: Cardiovascular;  Laterality: N/A;  . CARDIOVERSION N/A 10/14/2018   Procedure: CARDIOVERSION;  Surgeon: Fay Records, MD;  Location: [facility] ENDOSCOPY;  Service: Cardiovascular;  Laterality: N/A;  . COLONOSCOPY    . CORONARY ARTERY BYPASS GRAFT  2000   CABG X5  . ESOPHAGOGASTRODUODENOSCOPY    . HEMORRHOID BANDING    . INTRAVASCULAR PRESSURE WIRE/FFR STUDY N/A 01/05/2018   Procedure: INTRAVASCULAR PRESSURE WIRE/FFR STUDY;  Surgeon: Sherren Mocha, MD;  Location: [place] CV LAB;  Service: Cardiovascular;  Laterality: N/A;  . LEFT HEART CATH AND CORS/GRAFTS ANGIOGRAPHY N/A 01/05/2018   Procedure: LEFT HEART CATH AND CORS/GRAFTS ANGIOGRAPHY;  Surgeon: Sherren Mocha, MD;  Location: [place] CV LAB;  Service: Cardiovascular;  Laterality: N/A;  . PERMANENT PACEMAKER INSERTION N/A 07/15/2012   Procedure: PERMANENT PACEMAKER INSERTION;  Surgeon: Deboraha Sprang, MD;  Location: [facility] CATH LAB;  Service: Cardiovascular;  Laterality: N/A;  . rotator cuff surgery Right 2017  . TONSILLECTOMY  1956    Current Outpatient Medications  Medication Sig Dispense Refill  . acetaminophen (TYLENOL) 500 MG tablet Take  1,000 mg by mouth every 6 (six) hours as needed for moderate pain or headache.    Marland Sar atorvastatin (LIPITOR) 20 MG tablet Take 20 mg by mouth every evening.     . Cholecalciferol (D3-1000) 25 MCG (1000 UT) capsule Take 1,000 Units by mouth daily.    . Emollient (CETAPHIL EX) Apply 1 application topically daily as needed (rash). Compounded with triamcinolone cream    . loratadine (CLARITIN) 10 MG tablet Take 10 mg by mouth daily.    . Multiple Vitamin (MULTIVITAMIN) tablet Take  1 tablet by mouth daily with supper.     . pantoprazole (PROTONIX) 40 MG tablet Take 40 mg by mouth daily.    . polyvinyl alcohol (LIQUIFILM TEARS) 1.4 % ophthalmic solution Place 1 drop into both eyes every 8 (eight) hours as needed for dry eyes.    . ramipril (ALTACE) 2.5 MG capsule Take 2.5 mg by mouth every evening.     . rivaroxaban (XARELTO) 20 MG TABS tablet Take 20 mg by mouth daily with supper.     No current facility-administered medications for this visit.     Allergies:   Cayenne; Niacin and related; and Sulfonamide derivatives   Social History:  The patient  reports that he has never smoked. He has never used smokeless tobacco. He reports current alcohol use of about 21.0 standard drinks of alcohol per week. He reports that he does not use drugs.   Family History:  The patient's  family history includes Coronary artery disease (age of onset: 58) in an other family member; Diabetes in his father and another family member; Hyperlipidemia in an other family member; Hypertension in an other family member; Multiple myeloma in his mother; Renal Disease in his father.   ROS:  Please see the history of present illness.   All other systems are personally reviewed and negative.    Exam:    Vital Signs:  BP 123/78   Pulse 70   Ht _0  (1.803 m)   Wt 192 lb (87.1 kg)   BMI 26.78 kg/m  repeat BP is 124/77, HR 70  Well appearing, alert and conversant, regular work of breathing,  good skin color Eyes- anicteric, neuro- grossly intact, skin- no apparent rash or lesions or cyanosis, mouth- oral mucosa is pink   Labs/Other Tests and Data Reviewed:    Recent Labs: 08/18/2018: ALT 32; TSH 4.61 01/20/2019: BUN 22; Creatinine, Ser 1.17; Potassium 4.4; Sodium 143 01/27/2019: Hemoglobin 13.6; Platelets 155   Wt Readings from Last 3 Encounters:  04/21/19 192 lb (87.1 kg)  02/06/19 198 lb (89.8 kg)  01/30/19 200 lb 9.6 oz (91 kg)     Other studies personally reviewed: Additional  studies/ records that were reviewed today include: ablation note, my prior notes,  AF clinic notes Review of the above records today demonstrates: as above Prior radiographs: cardiac CT 01/19/2019 reviewed  Last device remote is reviewed from [place] PDF dated 03/23/2019 which reveals normal device function, no arrhythmias    ASSESSMENT & PLAN:    1.  afib Well controlled post ablation No changes at this time No afib post ablation of AAD therapy I worry that given severe RA enlargement that he may have additional arrhythmias down the road. chads2vasc score is 3.  Continue xarelto.  2. Sick sinus syndrome Doing well He would like for his heart rate to go to the 120s with exercise On return I would advise that we consider reducing his lower rate to 60 bpm and  increase his upper sensor rate to 130 bpm.  3. Hematoma Resolved No further management required  4. COVID 19 screen The patient denies symptoms of COVID 19 at this time.  The importance of social distancing was discussed today.  Follow-up:  3 months with me, will follow-up with Dr Caryl Comes thereafter Next remote: 05/2019  Patient Risk:  after full review of this patients clinical status, I feel that they are at moderate risk at this time.  Today, I have spent 15 minutes with the patient with telehealth technology discussing afib .    Army Fossa, MD  04/21/2019 9:29 AM     [facility] [address] [place]  [address] [phone] (office) [phone] (fax)

## 2019-04-25 ENCOUNTER — Ambulatory Visit: Payer: PPO | Admitting: Sports Medicine

## 2019-04-25 ENCOUNTER — Encounter: Payer: Self-pay | Admitting: Sports Medicine

## 2019-04-25 ENCOUNTER — Other Ambulatory Visit: Payer: Self-pay

## 2019-04-25 ENCOUNTER — Ambulatory Visit: Payer: Self-pay

## 2019-04-25 VITALS — BP 124/70 | Ht 71.0 in | Wt 192.0 lb

## 2019-04-25 DIAGNOSIS — M25561 Pain in right knee: Secondary | ICD-10-CM

## 2019-04-25 NOTE — Patient Instructions (Signed)
It was great to see you today for your office visit.  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 To ensure proper tracking of your patella, we recommend doing the below exercises daily.  1. Short knee bend down to 30 degrees on each knee 10 reps for 3 sets 2. Knee slides against a wall down to 90 degrees 10 reps for 3 sets 3. VMO leg raises 3 sets of 10 reps  You may wear your knee compression sleeve with activity. You may try topical capsaicin cream if needed over the counter over the area. We will avoid anti-inflammatories since you are Xarelto.  If you are still having problems, we will consider ordered an x-ray of the knee and consider an injection with steroid if sharp pain persists.  We will see you back in 6-8 weeks.

## 2019-04-25 NOTE — Progress Notes (Signed)
Dariusz Brase - 73 y.o. male MRN [id number]  Date of birth: Apr 29, 1946   Chief complaint: Right knee pain  SUBJECTIVE:    History of present illness: 73 year old male with a 3 to 4-week history of right knee pain.  He describes it as superior and anterior knee pain worse with activity.  Denies any trauma or injury to the area.  He has a history of possible meniscus injury several years ago.  At that time he received a cortisone injection and activity modification with improvement of his symptoms.  Now, his pain is described as intermittent.  He rates his pain 2 out of 10 worse with turning type movements.  It seems to be worse at night as well.  He does have days where it does not bother him.  Denies any locking, popping or giving way of his knee.  He has been unable to take anti-inflammatories secondary to being on Xarelto.  He has not worn any braces.  Denies any weakness, numbness or tingling of the leg.  He states he is going on vacation and will be doing a lot of walking at the beach and is wondering what types of exercises he can perform.  Denies any hip or groin pain.  No ankle pain.   Review of systems:  Per HPI; in addition no fever, no rash, no additional weakness, no additional numbness, no additional paresthesias, and no additional fall/injury   Interval past medical history, surgical history, family history, and social history obtained.  No family history of bone or joint diseases.  He does have a history of recent repeat ablation due to atrial fibrillation in February 2020.  Medications reviewed.  Of note he is on Xarelto Allergies reviewed.  Of note he has a sulfa and niacin allergy.  OBJECTIVE:  Physical exam: Vital signs are reviewed. BP 124/70   Ht 5\' 11"  (1.803 m)   Wt 192 lb (87.1 kg)   BMI 26.78 kg/m   Gen.: Alert, oriented, appears stated age, in no apparent distress Integumentary: No rashes, erythema or ecchymoses of the right knee Neurologic:  Sensation is  intact light touch L4-S1 on the right Gait: normal without associated limp Musculoskeletal: Inspection of the right knee demonstrates mild swelling.  He has a mild palpable effusion.  Mild medial joint line tenderness to palpation.  Mild patellofemoral crepitus with range of motion.  Full range of motion knee flexion and extension.  Strength testing is 4.5 out of 5 with resisted leg extension.  5 out of 5 in knee flexion.  5 out of 5 in ankle dorsiflexion and plantarflexion.  Negative anterior drawer.  Negative Lockman test.  Negative McMurray's test.  No pain elicited with Thessaly's testing.  Stable to varus valgus stress.  No pain with patellar grind.  ULTRASOUND: Knee, Right Diagnostic limited ultrasound imaging obtained of patient's R knee.  - Quadriceps tendon: No appreciated signs of tearing, edema, or calcification. Moderate sized fluid/edema noted within the suprapatellar pouch.  Hyperechoic "calcifications" present in suprapatellar pouch very small. - Patellar tendon: No appreciated signs of tearing, edema, or calcification. No infrapatellar or tibial tuberosity fluid or abnormality appreciated.  - Medial joint line: No signs concerning for meniscal pathology appreciated. No increased fluid presence noted. No evidence of osteophyte development or significant joint space loss.  - Lateral joint line: No signs concerning for meniscal pathology appreciated. No increased fluid presence noted. No evidence of osteophyte development or significant joint space loss.  - MCL: No evidence of integrity  loss or abnormal fluid presence.  - LCL: No evidence of integrity loss or abnormal fluid presence.  -Trochlear view with evidence of medial joint line thinning of cartilage.  Small spur noted in the lateral femoral condylar surface.   IMPRESSION: findings consistent with moderate sized joint effusion.  Degenerative changes noted in the medial and lateral patello-femoral joint in trochlear groove.   Ultrasound and interpretation by Clydene Laming, DO and Wolfgang Phoenix. 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  No peripheral meniscal abnormalities.  No tendon abnormalities. -Recommend quadriceps strengthening exercises as detailed in the AVS -Body helix knee sleeve given today for compression given the effusion -May try topical over-the-counter therapies with capsaicin cream if needed.  Will avoid anti-inflammatories as he is on Xarelto -Follow-up in 6 to 8 weeks.  At that point if he remains symptomatic, recommend weightbearing x-rays and consideration of steroid injection -Independently reviewed previous ultrasound from 2018 today with Dr. Oneida Alar  Orders Placed This Encounter  Procedures  . [place] LIMITED JOINT SPACE STRUCTURES LOW RIGHT    Standing Status:   Future    Number of Occurrences:   1    Standing Expiration Date:   06/23/2020    Order Specific Question:   Reason for Exam (SYMPTOM  OR DIAGNOSIS REQUIRED)    Answer:   knee pain    Order Specific Question:   Preferred imaging location?    Answer:   Internal    Clydene Laming, DO Sports Medicine Fellow [place] observed and examined the patient with Dr. Lunette Stands and agree with assessment and plan.  Note reviewed and modified by me. Ila Mcgill, MD

## 2019-04-25 NOTE — Assessment & Plan Note (Signed)
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  No peripheral meniscal abnormalities.  No tendon abnormalities. -Recommend quadriceps strengthening exercises as detailed in the AVS -Body helix knee sleeve given today for compression given the effusion -May try topical over-the-counter therapies with capsaicin cream if needed.  Will avoid anti-inflammatories as he is on Xarelto -Follow-up in 6 to 8 weeks.  At that point if he remains symptomatic, recommend weightbearing x-rays and consideration of steroid injection

## 2019-06-06 ENCOUNTER — Other Ambulatory Visit: Payer: Self-pay

## 2019-06-06 ENCOUNTER — Ambulatory Visit: Payer: PPO | Admitting: Sports Medicine

## 2019-06-06 DIAGNOSIS — Q6671 Congenital pes cavus, right foot: Secondary | ICD-10-CM

## 2019-06-06 DIAGNOSIS — Q6672 Congenital pes cavus, left foot: Secondary | ICD-10-CM

## 2019-06-06 DIAGNOSIS — M25561 Pain in right knee: Secondary | ICD-10-CM

## 2019-06-06 NOTE — Assessment & Plan Note (Signed)
Does well in orthotics  Patient was fitted for a : standard, cushioned, semi-rigid orthotic. The orthotic was heated and afterward the patient stood on the orthotic blank positioned on the orthotic stand. The patient was positioned in subtalar neutral position and 10 degrees of ankle dorsiflexion in a weight bearing stance. After completion of molding, a stable base was applied to the orthotic blank. The blank was ground to a stable position for weight bearing. Size: 11 red EVA Blue medium EVA Base: Lateral heel wedge on RT Posting:lateral first ray post left Additional orthotic padding:  Comfortable and neutral walking gait on completion

## 2019-06-06 NOTE — Assessment & Plan Note (Signed)
Much improved on HEP and  Compression  Will keep with this plan  Orthotics seems to help lessen his knee pain as well

## 2019-06-06 NOTE — Progress Notes (Signed)
History of Present Illness: Russell Griffith is a 73 year old male who presents for follow up of right knee pain as well as to have another pair of orthotics made for him. His knee pain started 4-5 months ago and was located both on the medial and lateral aspects of his left right knee. He was last seen in May and was thought to have chondromalacia of the patella vs intra-articular cartilage injury. Previously patient was having intermittent swelling of the knee and was noted to have a moderate joint effusion on ultrasound at his previous visit. Since his last visit he has had significant improvement in his knee pain. He has been walking on the beach, doing the elliptical machine, and walking up and down stairs without difficulty. He has been using a body helix knee sleeve and has also been working on home exercises given to him at his last visit. He no longer has swelling of the knee. He denies any popping or locking of his knee. He has no weakness, numbness or tingling of his leg.  Patient also presents to have a second pair of orthotics made for him. Patient had orthotics made for him to correct an over supination gait abnormality and cavus foot deformity. Patient would like a second pair for his running shoes.  Past Hx: 2 ablations for AFIB  Review of systems: Per HPI. In addition, no weakness, no numbness, no paresthesias, no hx of injury  Medical hx reviewed. He does have a history of atrial fibrillation Medications reviewed: Of note, patient is on xarelto Allergies reviewed: Of note, patient has sulfa and niacin allergy  Objective General: Alert, oriented, no acute distres Skin: no rashes, erythema, or ecchymoses of right knee MSK R knee -Inspection: normal inspection. No swelling or bruising noted. No joint effusion noted -ROM: Full ROM with flexion, extension of knee -Strength: 5/5 with flexion/extension of knee. 5/5 with flexion of hip. 5/5 foot dorsiflexion and  plantarflexion -Palpation: No tenderness to palpation. -Varus/valgus testing: Normal -Lachman: Normal -Posterior drawer: Normal MSK Feet -bilateral cavus foot deformity -Gait: over supination bilaterally  Assessment and Plan Russell Griffith is a 73 year old male who presents for follow up of right knee pain as well as to have another pair of orthotics made for him  1. Right knee pain -Significantly improved from last visit. Previously thought to be 2/2 chondromalacia vs intra-articular cartilage injury -No effusion noted on exam today -Increase activity as tolerated -Continue home exercises as needed -Continue body helix sleeve with activity  2. 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 Sheppard Coil and agree with assessment and plan.  Note reviewed and modified by me.  Ila Mcgill, MD

## 2019-06-22 ENCOUNTER — Ambulatory Visit (INDEPENDENT_AMBULATORY_CARE_PROVIDER_SITE_OTHER): Payer: PPO | Admitting: *Deleted

## 2019-06-22 DIAGNOSIS — I495 Sick sinus syndrome: Secondary | ICD-10-CM

## 2019-06-22 DIAGNOSIS — I480 Paroxysmal atrial fibrillation: Secondary | ICD-10-CM

## 2019-06-23 ENCOUNTER — Telehealth: Payer: Self-pay

## 2019-06-23 LAB — CUP PACEART REMOTE DEVICE CHECK
Battery Remaining Longevity: 60 mo
Battery Remaining Percentage: 64 %
Brady Statistic RA Percent Paced: 47 %
Brady Statistic RV Percent Paced: 0 %
Date Time Interrogation Session: 20200724151654
Implantable Lead Implant Date: 20130816
Implantable Lead Implant Date: 20130816
Implantable Lead Location: 753859
Implantable Lead Location: 753860
Implantable Lead Model: 5076
Implantable Lead Model: 5076
Implantable Pulse Generator Implant Date: 20130816
Lead Channel Impedance Value: 545 Ohm
Lead Channel Impedance Value: 549 Ohm
Lead Channel Pacing Threshold Amplitude: 0.8 V
Lead Channel Pacing Threshold Pulse Width: 0.4 ms
Lead Channel Setting Pacing Amplitude: 2 V
Lead Channel Setting Sensing Sensitivity: 2.5 mV
Pulse Gen Serial Number: 111255

## 2019-06-23 NOTE — Telephone Encounter (Signed)
Left message for patient to remind of missed remote transmission.  

## 2019-06-27 ENCOUNTER — Telehealth: Payer: Self-pay | Admitting: Internal Medicine

## 2019-06-27 NOTE — Telephone Encounter (Signed)
New Messages          Patient is calling in today because he thinks his A-fib has come back and would like a call back concerning this matter.

## 2019-06-27 NOTE — Telephone Encounter (Signed)
Transmission reviewed, presenting rhythm suggests pt is back in AF. Pt states he has been feeling lightheaded and nauseous for the last 3 days. % AT/AF <1%. Pt had ablation 01/25/19.

## 2019-06-27 NOTE — Telephone Encounter (Signed)
Pt sent a remote transmission in yesterday because he believes his A-fib has came back. He would like for someone to look at his transmission and give him a call back.

## 2019-06-28 ENCOUNTER — Encounter (HOSPITAL_COMMUNITY): Payer: Self-pay | Admitting: Physician Assistant

## 2019-06-28 ENCOUNTER — Ambulatory Visit (HOSPITAL_COMMUNITY)
Admission: RE | Admit: 2019-06-28 | Discharge: 2019-06-28 | Disposition: A | Discharge status: Home / Self Care | Payer: PPO | Source: Ambulatory Visit | Attending: Physician Assistant | Admitting: Physician Assistant

## 2019-06-28 ENCOUNTER — Other Ambulatory Visit: Payer: Self-pay

## 2019-06-28 VITALS — BP 138/70 | HR 56 | Ht 71.0 in | Wt 198.0 lb

## 2019-06-28 DIAGNOSIS — Z7901 Long term (current) use of anticoagulants: Secondary | ICD-10-CM | POA: Insufficient documentation

## 2019-06-28 DIAGNOSIS — Z950 Presence of cardiac pacemaker: Secondary | ICD-10-CM | POA: Diagnosis not present

## 2019-06-28 DIAGNOSIS — Z882 Allergy status to sulfonamides status: Secondary | ICD-10-CM | POA: Diagnosis not present

## 2019-06-28 DIAGNOSIS — Z951 Presence of aortocoronary bypass graft: Secondary | ICD-10-CM | POA: Diagnosis not present

## 2019-06-28 DIAGNOSIS — I4819 Other persistent atrial fibrillation: Secondary | ICD-10-CM

## 2019-06-28 DIAGNOSIS — K219 Gastro-esophageal reflux disease without esophagitis: Secondary | ICD-10-CM | POA: Insufficient documentation

## 2019-06-28 DIAGNOSIS — Z807 Family history of other malignant neoplasms of lymphoid, hematopoietic and related tissues: Secondary | ICD-10-CM | POA: Insufficient documentation

## 2019-06-28 DIAGNOSIS — E785 Hyperlipidemia, unspecified: Secondary | ICD-10-CM | POA: Insufficient documentation

## 2019-06-28 DIAGNOSIS — I495 Sick sinus syndrome: Secondary | ICD-10-CM | POA: Insufficient documentation

## 2019-06-28 DIAGNOSIS — Z79899 Other long term (current) drug therapy: Secondary | ICD-10-CM | POA: Diagnosis not present

## 2019-06-28 DIAGNOSIS — Z841 Family history of disorders of kidney and ureter: Secondary | ICD-10-CM | POA: Insufficient documentation

## 2019-06-28 DIAGNOSIS — Z833 Family history of diabetes mellitus: Secondary | ICD-10-CM | POA: Diagnosis not present

## 2019-06-28 DIAGNOSIS — I10 Essential (primary) hypertension: Secondary | ICD-10-CM | POA: Diagnosis not present

## 2019-06-28 DIAGNOSIS — I2510 Atherosclerotic heart disease of native coronary artery without angina pectoris: Secondary | ICD-10-CM | POA: Diagnosis not present

## 2019-06-28 NOTE — Progress Notes (Signed)
Primary Care Physician: Russell Anger, Griffith Primary Cardiologist: Russell Russell Griffith Primary Electrophysiologist: Russell Russell Griffith Referring Physician: Dr Russell Griffith is a 73 y.o. male with a history of CAD s/p CABG, sinus node dysfunction s/p pacemaker implant, HTN, HLD, carotid artery disease, and persistent atrial fibrillation and atrial flutter who presents for follow up in the [facility].  The patient was initially diagnosed with atrial flutter and underwent CTI ablation with Russell Russell Griffith. Patient was later diagnosed with atrial fibrillation and had an afib ablation at [facility]. Unfortunately, his afib returned and he is now s/p repeat ablation with Russell Griffith. He did have a hematoma post ablation which has resolved.  On follow up today, patient reports that for the last 3-4 days he has had some lightheadedness and exercise intolerance. His latest device interrogation on 06/24/19 showed his presenting rhythm as afib. He remains in afib today. There were no specific triggers that the patient could identify. He does admit to alcohol use, about 1-2 glasses of wine with dinner.  Today, he denies symptoms of palpitations, chest pain, orthopnea, PND, lower extremity edema, dizziness, presyncope, syncope, snoring, daytime somnolence, bleeding, or neurologic sequela. The patient is tolerating medications without difficulties and is otherwise without complaint today.   he has a BMI of Body mass index is 27.62 kg/m.Russell Griffith Filed Weights   06/28/19 1340  Weight: 89.8 kg    Family History  Problem Relation Age of Onset  . Coronary artery disease Other 70  . Diabetes Other   . Multiple myeloma Mother   . Diabetes Father   . Renal Disease Father   . Hyperlipidemia Other   . Hypertension Other   . Colon cancer Neg Hx   . Esophageal cancer Neg Hx   . Stomach cancer Neg Hx   . Rectal cancer Neg Hx      Atrial Fibrillation Management history:  Previous antiarrhythmic  drugs: Tikosyn Previous cardioversions: 10/14/18 Previous ablations: remote at [facility], 01/25/19 CHADS2VASC score: 3 (age, HTN, CAD) Anticoagulation history: Xarelto   Past Medical History:  Diagnosis Date  . Aortic stenosis    Mild, echo, April, 2014  . BPH (benign prostatic hyperplasia)   . CAD (coronary artery disease)    a. s/p CABG  . Carotid artery disease ([place])   . Colonic polyp   . Diverticulosis   . Elevated bilirubin    Mild chronic elevation, 2.0 January, 2011 stable  . GERD (gastroesophageal reflux disease)    Barrett's esophagus  . HTN (hypertension)   . Hyperlipidemia    Low HDL  . 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 Persistent atrial fibrillation    a. s/p PVI at [facility]  . Primary osteoarthritis of left knee    Mild  . Prolapsed internal hemorrhoids, grade 3 08/12/2015  . PVC's (premature ventricular contractions)   . Tubular adenoma of colon    Past Surgical History:  Procedure Laterality Date  . ABLATION     PVI at [facility]  . ATRIAL FIBRILLATION ABLATION N/A 01/25/2019   Procedure: ATRIAL FIBRILLATION ABLATION;  Surgeon: Russell Griffith;  Location: [place] CV LAB;  Service: Cardiovascular;  Laterality: N/A;  . CARDIOVERSION N/A 10/14/2018   Procedure: CARDIOVERSION;  Surgeon: Russell Records, Griffith;  [facility] ENDOSCOPY;  Service: Cardiovascular;  Laterality: N/A;  . COLONOSCOPY    . CORONARY ARTERY BYPASS GRAFT  2000   CABG X5  . ESOPHAGOGASTRODUODENOSCOPY    .  HEMORRHOID BANDING    . INTRAVASCULAR PRESSURE WIRE/FFR STUDY N/A 01/05/2018   Procedure: INTRAVASCULAR PRESSURE WIRE/FFR STUDY;  Surgeon: Russell Mocha, Griffith;  Location: [place] CV LAB;  Service: Cardiovascular;  Laterality: N/A;  . LEFT HEART CATH AND CORS/GRAFTS ANGIOGRAPHY N/A 01/05/2018   Procedure: LEFT HEART CATH AND CORS/GRAFTS ANGIOGRAPHY;  Surgeon: Russell Mocha, Griffith;  Location: [place] CV LAB;  Service: Cardiovascular;  Laterality: N/A;  . PERMANENT  PACEMAKER INSERTION N/A 07/15/2012   Procedure: PERMANENT PACEMAKER INSERTION;  Surgeon: Russell Sprang, Griffith;  Location: [facility] CATH LAB;  Service: Cardiovascular;  Laterality: N/A;  . rotator cuff surgery Right 2017  . TONSILLECTOMY  1956    Current Outpatient Medications  Medication Sig Dispense Refill  . acetaminophen (TYLENOL) 500 MG tablet Take 1,000 mg by mouth every 6 (six) hours as needed for moderate pain or headache.    Russell Abraha atorvastatin (LIPITOR) 20 MG tablet Take 20 mg by mouth every evening.     . Cholecalciferol (D3-1000) 25 MCG (1000 UT) capsule Take 1,000 Units by mouth daily.    . Emollient (CETAPHIL EX) Apply 1 application topically daily as needed (rash). Compounded with triamcinolone cream    . pantoprazole (PROTONIX) 40 MG tablet Take 40 mg by mouth daily.    . polyvinyl alcohol (LIQUIFILM TEARS) 1.4 % ophthalmic solution Place 1 drop into both eyes every 8 (eight) hours as needed for dry eyes.    . ramipril (ALTACE) 2.5 MG capsule Take 2.5 mg by mouth every evening.     . rivaroxaban (XARELTO) 20 MG TABS tablet Take 20 mg by mouth daily with supper.     No current facility-administered medications for this encounter.     Allergies  Allergen Reactions  . Cayenne Other (See Comments)    Sweats w/paprika too  . Niacin And Related Other (See Comments)    Upset stomach  . Sulfonamide Derivatives Other (See Comments)    "have no idea; mother told me I was allergic to"    Social History   Socioeconomic History  . Marital status: Married    Spouse name: Not on file  . Number of children: 2  . Years of education: 45  . Highest education level: Not on file  Occupational History  . Occupation: Technical brewer: Engineer, mining    Comment: [employer]  Social Needs  . Financial resource strain: Not on file  . Food insecurity    Worry: Not on file    Inability: Not on file  . Transportation needs    Medical: Not on file    Non-medical: Not  on file  Tobacco Use  . Smoking status: Never Smoker  . Smokeless tobacco: Never Used  Substance and Sexual Activity  . Alcohol use: Yes    Alcohol/week: 21.0 standard drinks    Types: 21 Glasses of wine per week  . Drug use: No  . Sexual activity: Yes    Partners: Female  Lifestyle  . Physical activity    Days per week: Not on file    Minutes per session: Not on file  . Stress: Not on file  Relationships  . Social Herbalist on phone: Not on file    Gets together: Not on file    Attends religious service: Not on file    Active member of club or organization: Not on file    Attends meetings of clubs or organizations: Not on file  Relationship status: Not on file  . Intimate partner violence    Fear of current or ex partner: Not on file    Emotionally abused: Not on file    Physically abused: Not on file    Forced sexual activity: Not on file  Other Topics Concern  . Not on file  Social History Narrative   [employer] [place], [place].  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  2 daughters-'75, '79; 1 grandchild; step-daughter and step grandson.  Regular exercise-yes, runs 1.5-2 mi 4x/wk, also eliptical      Patient signed a Designated Party Release to allow his wife, Spenser Harren, to have access to his medical Griffith/ information.     ROS- All systems are reviewed and negative except as per the HPI above.  Physical Exam: Vitals:   06/28/19 1340  BP: 138/70  Pulse: (!) 56  Weight: 89.8 kg  Height: '5\' 11"'  (1.803 m)    GEN- The patient is well appearing, alert and oriented x 3 today.   HEENT-head normocephalic, atraumatic, sclera clear, conjunctiva pink, hearing intact, trachea midline. Lungs- Clear to ausculation bilaterally, normal work of breathing Heart- irregular rate and rhythm, no murmurs, rubs or gallops  GI- soft, NT, ND, + BS Extremities- no clubbing, cyanosis, or edema  MS- no significant deformity or atrophy Skin- no rash or lesion Psych- euthymic mood, full affect Neuro- strength and sensation are intact   Wt Readings from Last 3 Encounters:  06/28/19 89.8 kg  04/25/19 87.1 kg  04/21/19 87.1 kg    EKG afib HR 56, QRS 90, QTc 420  Echo 11/11/18 demonstrated  - Left ventricle: The cavity size was normal. There was mild focal   basal hypertrophy of the septum. Systolic function was normal.   The estimated ejection fraction was in the range of 60% to 65%.   Wall motion was normal; there were no regional wall motion   abnormalities. The study is not technically sufficient to allow   evaluation of LV diastolic function. - Aortic valve: Trileaflet; mildly thickened, mildly calcified   leaflets. There was mild stenosis. There was mild regurgitation.   Valve area (VTI): 1.47 cm^2. - Mitral valve: There was mild regurgitation. - Left atrium: The atrium was mildly dilated. - Right ventricle: Pacer wire or catheter noted in right ventricle. - Tricuspid valve: There was mild regurgitation. - Pulmonary arteries: Systolic pressure was moderately increased.   PA peak pressure: 52 mm Hg (S).  Epic Griffith are reviewed at length today  Assessment and Plan:  1. Persistent atrial fibrillation S/p repeat ablation with Russell Griffith 01/25/19.   Recent device interrogation shows AF burden <1% but presenting rhythm was afib, appears to be persistent since then. We discussed therapeutic options including DCCV +/- AAD. Patient would like to take time to consider his options and speak to Russell Griffith at his scheduled follow up before proceeding. Patient agreed that if he should feel worse, he would go ahead with cardioversion.  Continue Xarelto 20 mg daily.  This patients CHA2DS2-VASc Score and unadjusted Ischemic Stroke Rate (% per year) is equal to 3.2 % stroke rate/year from a score of 3  Above score calculated as 1 point each if present [CHF, HTN, DM,  Vascular=MI/PAD/Aortic Plaque, Age if 65-74, or Male] Above score calculated as 2 points each if present [Age > 75, or Stroke/TIA/TE]  2. Sinus node dysfunction S/p PPM, followed by Russell Russell Griffith and device clinic.  3. CAD S/p CABG. No anginal symptoms. Continue present therapy and risk factor modification.  4. HTN Stable, no changes today.   Follow up with Russell Griffith as scheduled.   [facility] [address] [place], [place] [address] [phone] 06/28/2019 3:10 PM

## 2019-06-28 NOTE — Telephone Encounter (Signed)
Afib clinic to call Pt to determine f/u appt.

## 2019-06-28 NOTE — Telephone Encounter (Signed)
appt made for today at 1:30

## 2019-06-28 NOTE — Telephone Encounter (Signed)
Follow Up:    Pt said he was waiting to see if he needs  To come in or what. He thinks he is still in AFIB.

## 2019-06-28 NOTE — Telephone Encounter (Signed)
Follow up:    Patient returning call. Please call patient back.

## 2019-07-04 NOTE — Progress Notes (Signed)
Remote pacemaker transmission.   

## 2019-07-05 NOTE — Telephone Encounter (Signed)
Noted Stacy thanks steve k

## 2019-07-24 ENCOUNTER — Encounter: Payer: PPO | Admitting: Internal Medicine

## 2019-07-31 ENCOUNTER — Encounter: Payer: PPO | Admitting: Physician Assistant

## 2019-08-10 ENCOUNTER — Telehealth: Payer: Self-pay

## 2019-08-10 NOTE — Telephone Encounter (Signed)
Spoke with pt regarding his appt on 08/14/19. Pt stated he did not have any questions at this time.

## 2019-08-14 ENCOUNTER — Encounter: Payer: Self-pay | Admitting: Internal Medicine

## 2019-08-14 ENCOUNTER — Telehealth (INDEPENDENT_AMBULATORY_CARE_PROVIDER_SITE_OTHER): Payer: PPO | Admitting: Internal Medicine

## 2019-08-14 ENCOUNTER — Telehealth: Payer: Self-pay

## 2019-08-14 VITALS — Ht 71.0 in | Wt 190.0 lb

## 2019-08-14 DIAGNOSIS — I495 Sick sinus syndrome: Secondary | ICD-10-CM

## 2019-08-14 DIAGNOSIS — I4819 Other persistent atrial fibrillation: Secondary | ICD-10-CM

## 2019-08-14 DIAGNOSIS — I10 Essential (primary) hypertension: Secondary | ICD-10-CM | POA: Diagnosis not present

## 2019-08-14 DIAGNOSIS — I259 Chronic ischemic heart disease, unspecified: Secondary | ICD-10-CM

## 2019-08-14 NOTE — Telephone Encounter (Signed)
Call placed to Pt to set up DCCV.  Left detailed message.  Advised would send mychart message also.

## 2019-08-14 NOTE — H&P (View-Only) (Signed)
Electrophysiology TeleHealth Note   Due to national recommendations of social distancing due to COVID 19, an audio/video telehealth visit is felt to be most appropriate for this patient at this time.  See MyChart message from today for the patient's consent to telehealth for [facility].    Date:  08/14/2019   ID:  Russell Griffith, DOB 11-Feb-1946, MRN [id number]  Location: patient's home  Provider location:  [facility]  Evaluation Performed: Follow-up visit  PCP:  [name], Evie Lacks, MD   Electrophysiologist:  Dr Rayann Heman  Chief Complaint:  palpitations  History of Present Illness:    Russell Griffith is a 73 y.o. male who presents via telehealth conferencing today.  Since last being seen in our clinic, the patient reports doing reasonably well.  He has developed recurrent symptomatic afib. He has been in afib for several months.  He was seen in AF clinic (note reviewed). Today, he denies symptoms of palpitations, chest pain, shortness of breath,  lower extremity edema, dizziness, presyncope, or syncope.  The patient is otherwise without complaint today.   Past Medical History:  Diagnosis Date  . Aortic stenosis    Mild, echo, April, 2014  . BPH (benign prostatic hyperplasia)   . CAD (coronary artery disease)    a. s/p CABG  . Carotid artery disease ([place])   . Colonic polyp   . Diverticulosis   . Elevated bilirubin    Mild chronic elevation, 2.0 January, 2011 stable  . GERD (gastroesophageal reflux disease)    Barrett's esophagus  . HTN (hypertension)   . Hyperlipidemia    Low HDL  . 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 Persistent atrial fibrillation    a. s/p PVI at [facility]  . Primary osteoarthritis of left knee    Mild  . Prolapsed internal hemorrhoids, grade 3 08/12/2015  . PVC's (premature ventricular contractions)   . Tubular adenoma of colon     Past Surgical History:  Procedure Laterality Date  . ABLATION     PVI at  [facility]  . ATRIAL FIBRILLATION ABLATION N/A 01/25/2019   Procedure: ATRIAL FIBRILLATION ABLATION;  Surgeon: [name], MD;  Location: [place] CV LAB;  Service: Cardiovascular;  Laterality: N/A;  . CARDIOVERSION N/A 10/14/2018   Procedure: CARDIOVERSION;  Surgeon: Fay Records, MD;  Location: [facility] ENDOSCOPY;  Service: Cardiovascular;  Laterality: N/A;  . COLONOSCOPY    . CORONARY ARTERY BYPASS GRAFT  2000   CABG X5  . ESOPHAGOGASTRODUODENOSCOPY    . HEMORRHOID BANDING    . INTRAVASCULAR PRESSURE WIRE/FFR STUDY N/A 01/05/2018   Procedure: INTRAVASCULAR PRESSURE WIRE/FFR STUDY;  Surgeon: Sherren Mocha, MD;  Location: [place] CV LAB;  Service: Cardiovascular;  Laterality: N/A;  . LEFT HEART CATH AND CORS/GRAFTS ANGIOGRAPHY N/A 01/05/2018   Procedure: LEFT HEART CATH AND CORS/GRAFTS ANGIOGRAPHY;  Surgeon: Sherren Mocha, MD;  Location: [place] CV LAB;  Service: Cardiovascular;  Laterality: N/A;  . PERMANENT PACEMAKER INSERTION N/A 07/15/2012   Procedure: PERMANENT PACEMAKER INSERTION;  Surgeon: Deboraha Sprang, MD;  Location: [facility] CATH LAB;  Service: Cardiovascular;  Laterality: N/A;  . rotator cuff surgery Right 2017  . TONSILLECTOMY  1956    Current Outpatient Medications  Medication Sig Dispense Refill  . acetaminophen (TYLENOL) 500 MG tablet Take 1,000 mg by mouth every 6 (six) hours as needed for moderate pain or headache.    Marland Sorrels atorvastatin (LIPITOR) 20 MG tablet Take 20 mg  by mouth every evening.     . Cholecalciferol (D3-1000) 25 MCG (1000 UT) capsule Take 1,000 Units by mouth daily.    . Emollient (CETAPHIL EX) Apply 1 application topically daily as needed (rash). Compounded with triamcinolone cream    . pantoprazole (PROTONIX) 40 MG tablet Take 40 mg by mouth daily.    . polyvinyl alcohol (LIQUIFILM TEARS) 1.4 % ophthalmic solution Place 1 drop into both eyes every 8 (eight) hours as needed for dry eyes.    . ramipril (ALTACE) 2.5 MG capsule Take 2.5 mg by mouth every evening.      . rivaroxaban (XARELTO) 20 MG TABS tablet Take 20 mg by mouth daily with supper.     No current facility-administered medications for this visit.     Allergies:   Cayenne, Niacin and related, and Sulfonamide derivatives   Social History:  The patient  reports that he has never smoked. He has never used smokeless tobacco. He reports current alcohol use of about 21.0 standard drinks of alcohol per week. He reports that he does not use drugs.   Family History:  The patient's family history includes Coronary artery disease (age of onset: 64) in an other family member; Diabetes in his father and another family member; Hyperlipidemia in an other family member; Hypertension in an other family member; Multiple myeloma in his mother; Renal Disease in his father.   ROS:  Please see the history of present illness.   All other systems are personally reviewed and negative.    Exam:    Vital Signs:  Ht _0  (1.803 m)   Wt 190 lb (86.2 kg)   BMI 26.50 kg/m   Well sounding and appearing, alert and conversant, regular work of breathing,  good skin color Eyes- anicteric, neuro- grossly intact, skin- no apparent rash or lesions or cyanosis, mouth- oral mucosa is pink  Labs/Other Tests and Data Reviewed:    Recent Labs: 08/18/2018: ALT 32; TSH 4.61 01/20/2019: BUN 22; Creatinine, Ser 1.17; Potassium 4.4; Sodium 143 01/27/2019: Hemoglobin 13.6; Platelets 155   Wt Readings from Last 3 Encounters:  08/14/19 190 lb (86.2 kg)  06/28/19 198 lb (89.8 kg)  04/25/19 192 lb (87.1 kg)     Last device remote is reviewed from [facility] which reveals normal device function, afib  ekg 06/28/2019- afib, V rates controlled   ASSESSMENT & PLAN:    1.  Persistent ablation S/p  PVI x 2 He has severe RA enlargment I have advised cardioversion We discussed options multaq, tikosyn, and amiodarone should AF return.  I would probably advise multaq as the first option He reports compliance with xarelto without  interruption.  2. Sick sinus syndrome Remotes are reviewed and normal He is previously programmed AAI Now that he is in afib, he has noticed heart rates in the 50s at times No changes today  3. CAD No ischemiic symptoms No changes  4. HTN Stable No change required today   Follow-up:  AF clinic 2 weeks after DCC<  Me 6 weeks after [facility]   Patient Risk:  after full review of this patients clinical status, I feel that they are at moderate risk at this time.  Today, I have spent 15 minutes with the patient with telehealth technology discussing arrhythmia management .    Army Fossa, MD  08/14/2019 11:21 AM     [facility] [address] [place] [address] [phone] (office) [phone] (fax)

## 2019-08-14 NOTE — Progress Notes (Signed)
Electrophysiology TeleHealth Note   Due to national recommendations of social distancing due to COVID 19, an audio/video telehealth visit is felt to be most appropriate for this patient at this time.  See MyChart message from today for the patient's consent to telehealth for [facility].    Date:  08/14/2019   ID:  Russell Griffith, DOB 11-Feb-1946, MRN [id number]  Location: patient's home  Provider location:  [facility]  Evaluation Performed: Follow-up visit  PCP:  [name], Evie Lacks, MD   Electrophysiologist:  Dr Rayann Heman  Chief Complaint:  palpitations  History of Present Illness:    Russell Griffith is a 73 y.o. male who presents via telehealth conferencing today.  Since last being seen in our clinic, the patient reports doing reasonably well.  He has developed recurrent symptomatic afib. He has been in afib for several months.  He was seen in AF clinic (note reviewed). Today, he denies symptoms of palpitations, chest pain, shortness of breath,  lower extremity edema, dizziness, presyncope, or syncope.  The patient is otherwise without complaint today.   Past Medical History:  Diagnosis Date  . Aortic stenosis    Mild, echo, April, 2014  . BPH (benign prostatic hyperplasia)   . CAD (coronary artery disease)    a. s/p CABG  . Carotid artery disease ([place])   . Colonic polyp   . Diverticulosis   . Elevated bilirubin    Mild chronic elevation, 2.0 January, 2011 stable  . GERD (gastroesophageal reflux disease)    Barrett's esophagus  . HTN (hypertension)   . Hyperlipidemia    Low HDL  . 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 Persistent atrial fibrillation    a. s/p PVI at [facility]  . Primary osteoarthritis of left knee    Mild  . Prolapsed internal hemorrhoids, grade 3 08/12/2015  . PVC's (premature ventricular contractions)   . Tubular adenoma of colon     Past Surgical History:  Procedure Laterality Date  . ABLATION     PVI at  [facility]  . ATRIAL FIBRILLATION ABLATION N/A 01/25/2019   Procedure: ATRIAL FIBRILLATION ABLATION;  Surgeon: [name], MD;  Location: [place] CV LAB;  Service: Cardiovascular;  Laterality: N/A;  . CARDIOVERSION N/A 10/14/2018   Procedure: CARDIOVERSION;  Surgeon: Fay Records, MD;  Location: [facility] ENDOSCOPY;  Service: Cardiovascular;  Laterality: N/A;  . COLONOSCOPY    . CORONARY ARTERY BYPASS GRAFT  2000   CABG X5  . ESOPHAGOGASTRODUODENOSCOPY    . HEMORRHOID BANDING    . INTRAVASCULAR PRESSURE WIRE/FFR STUDY N/A 01/05/2018   Procedure: INTRAVASCULAR PRESSURE WIRE/FFR STUDY;  Surgeon: Sherren Mocha, MD;  Location: [place] CV LAB;  Service: Cardiovascular;  Laterality: N/A;  . LEFT HEART CATH AND CORS/GRAFTS ANGIOGRAPHY N/A 01/05/2018   Procedure: LEFT HEART CATH AND CORS/GRAFTS ANGIOGRAPHY;  Surgeon: Sherren Mocha, MD;  Location: [place] CV LAB;  Service: Cardiovascular;  Laterality: N/A;  . PERMANENT PACEMAKER INSERTION N/A 07/15/2012   Procedure: PERMANENT PACEMAKER INSERTION;  Surgeon: Deboraha Sprang, MD;  Location: [facility] CATH LAB;  Service: Cardiovascular;  Laterality: N/A;  . rotator cuff surgery Right 2017  . TONSILLECTOMY  1956    Current Outpatient Medications  Medication Sig Dispense Refill  . acetaminophen (TYLENOL) 500 MG tablet Take 1,000 mg by mouth every 6 (six) hours as needed for moderate pain or headache.    Marland Sorrels atorvastatin (LIPITOR) 20 MG tablet Take 20 mg  by mouth every evening.     . Cholecalciferol (D3-1000) 25 MCG (1000 UT) capsule Take 1,000 Units by mouth daily.    . Emollient (CETAPHIL EX) Apply 1 application topically daily as needed (rash). Compounded with triamcinolone cream    . pantoprazole (PROTONIX) 40 MG tablet Take 40 mg by mouth daily.    . polyvinyl alcohol (LIQUIFILM TEARS) 1.4 % ophthalmic solution Place 1 drop into both eyes every 8 (eight) hours as needed for dry eyes.    . ramipril (ALTACE) 2.5 MG capsule Take 2.5 mg by mouth every evening.      . rivaroxaban (XARELTO) 20 MG TABS tablet Take 20 mg by mouth daily with supper.     No current facility-administered medications for this visit.     Allergies:   Cayenne, Niacin and related, and Sulfonamide derivatives   Social History:  The patient  reports that he has never smoked. He has never used smokeless tobacco. He reports current alcohol use of about 21.0 standard drinks of alcohol per week. He reports that he does not use drugs.   Family History:  The patient's family history includes Coronary artery disease (age of onset: 64) in an other family member; Diabetes in his father and another family member; Hyperlipidemia in an other family member; Hypertension in an other family member; Multiple myeloma in his mother; Renal Disease in his father.   ROS:  Please see the history of present illness.   All other systems are personally reviewed and negative.    Exam:    Vital Signs:  Ht _0  (1.803 m)   Wt 190 lb (86.2 kg)   BMI 26.50 kg/m   Well sounding and appearing, alert and conversant, regular work of breathing,  good skin color Eyes- anicteric, neuro- grossly intact, skin- no apparent rash or lesions or cyanosis, mouth- oral mucosa is pink  Labs/Other Tests and Data Reviewed:    Recent Labs: 08/18/2018: ALT 32; TSH 4.61 01/20/2019: BUN 22; Creatinine, Ser 1.17; Potassium 4.4; Sodium 143 01/27/2019: Hemoglobin 13.6; Platelets 155   Wt Readings from Last 3 Encounters:  08/14/19 190 lb (86.2 kg)  06/28/19 198 lb (89.8 kg)  04/25/19 192 lb (87.1 kg)     Last device remote is reviewed from [facility] [employer] which reveals normal device function, afib  ekg 06/28/2019- afib, V rates controlled   ASSESSMENT & PLAN:    1.  Persistent ablation S/p  PVI x 2 He has severe RA enlargment I have advised cardioversion We discussed options multaq, tikosyn, and amiodarone should AF return.  I would probably advise multaq as the first option He reports compliance with xarelto without  interruption.  2. Sick sinus syndrome Remotes are reviewed and normal He is previously programmed AAI Now that he is in afib, he has noticed heart rates in the 50s at times No changes today  3. CAD No ischemiic symptoms No changes  4. HTN Stable No change required today   Follow-up:  AF clinic 2 weeks after DCC<  Me 6 weeks after [facility]   Patient Risk:  after full review of this patients clinical status, I feel that they are at moderate risk at this time.  Today, I have spent 15 minutes with the patient with telehealth technology discussing arrhythmia management .    Army Fossa, MD  08/14/2019 11:21 AM     [facility] [address] [place] [address] [phone] (office) [phone] (fax)

## 2019-08-14 NOTE — Telephone Encounter (Signed)
-----   Message from [name], MD sent at 08/14/2019 11:38 AM EDT ----- Please call and schedule cardioversion  Af clinic 2 weeks after cardioversion Me 6 weeks after cardioversion

## 2019-08-15 ENCOUNTER — Other Ambulatory Visit: Payer: Self-pay

## 2019-08-15 ENCOUNTER — Other Ambulatory Visit: Payer: PPO

## 2019-08-15 ENCOUNTER — Other Ambulatory Visit (HOSPITAL_COMMUNITY)
Admission: RE | Admit: 2019-08-15 | Discharge: 2019-08-15 | Disposition: A | Discharge status: Home / Self Care | Payer: PPO | Source: Ambulatory Visit | Attending: Cardiology | Admitting: Cardiology

## 2019-08-15 ENCOUNTER — Telehealth (HOSPITAL_COMMUNITY): Payer: Self-pay | Admitting: Physician Assistant

## 2019-08-15 DIAGNOSIS — Z01812 Encounter for preprocedural laboratory examination: Secondary | ICD-10-CM | POA: Diagnosis not present

## 2019-08-15 DIAGNOSIS — I4819 Other persistent atrial fibrillation: Secondary | ICD-10-CM

## 2019-08-15 DIAGNOSIS — Z20828 Contact with and (suspected) exposure to other viral communicable diseases: Secondary | ICD-10-CM | POA: Insufficient documentation

## 2019-08-15 NOTE — Telephone Encounter (Signed)
Patient returned my call and is aware of appt 09/01/2019 with Adline Peals.

## 2019-08-15 NOTE — Telephone Encounter (Signed)
Pt scheduled for DCCV on 08/18/2019.  Will get labs and covid test today.  Instruction letter sent via mychart.  Work up complete.

## 2019-08-15 NOTE — Telephone Encounter (Signed)
Called and left message for patient to call back.  Per Myrtie Hawk @[facility], pt needs appt w/Ricky Fenton for 2 wk f/u after dccv on 08/18/2019.

## 2019-08-16 LAB — BASIC METABOLIC PANEL
BUN/Creatinine Ratio: 19 (ref 10–24)
BUN: 20 mg/dL (ref 8–27)
CO2: 24 mmol/L (ref 20–29)
Calcium: 9.5 mg/dL (ref 8.6–10.2)
Chloride: 103 mmol/L (ref 96–106)
Creatinine, Ser: 1.05 mg/dL (ref 0.76–1.27)
GFR calc Af Amer: 82 mL/min/{1.73_m2} (ref >59)
GFR calc non Af Amer: 71 mL/min/{1.73_m2} (ref >59)
Glucose: 100 mg/dL — ABNORMAL HIGH (ref 65–99)
Potassium: 4.2 mmol/L (ref 3.5–5.2)
Sodium: 141 mmol/L (ref 134–144)

## 2019-08-16 LAB — CBC WITH DIFFERENTIAL/PLATELET
Basophils Absolute: 0.1 10*3/uL (ref 0.0–0.2)
Basos: 1 %
EOS (ABSOLUTE): 0.1 10*3/uL (ref 0.0–0.4)
Eos: 1 %
Hematocrit: 44.3 % (ref 37.5–51.0)
Hemoglobin: 14.4 g/dL (ref 13.0–17.7)
Immature Grans (Abs): 0 10*3/uL (ref 0.0–0.1)
Immature Granulocytes: 0 %
Lymphocytes Absolute: 1.4 10*3/uL (ref 0.7–3.1)
Lymphs: 22 %
MCH: 29.4 pg (ref 26.6–33.0)
MCHC: 32.5 g/dL (ref 31.5–35.7)
MCV: 91 fL (ref 79–97)
Monocytes Absolute: 1.1 10*3/uL — ABNORMAL HIGH (ref 0.1–0.9)
Monocytes: 16 %
Neutrophils Absolute: 3.8 10*3/uL (ref 1.4–7.0)
Neutrophils: 60 %
Platelets: 163 10*3/uL (ref 150–450)
RBC: 4.89 x10E6/uL (ref 4.14–5.80)
RDW: 12.9 % (ref 11.6–15.4)
WBC: 6.4 10*3/uL (ref 3.4–10.8)

## 2019-08-16 LAB — NOVEL CORONAVIRUS, NAA (HOSP ORDER, SEND-OUT TO REF LAB; TAT 18-24 HRS): SARS-CoV-2, NAA: NOT DETECTED

## 2019-08-17 NOTE — Anesthesia Preprocedure Evaluation (Addendum)
Anesthesia Evaluation  Patient identified by MRN, date of birth, ID band Patient awake    Reviewed: Allergy & Precautions, NPO status , Patient's Chart, lab work & pertinent test results, reviewed documented beta blocker date and time   History of Anesthesia Complications Negative for: history of anesthetic complications  Airway Mallampati: I  TM Distance: >3 FB Neck ROM: Full    Dental  (+) Teeth Intact, Dental Advisory Given   Pulmonary neg pulmonary ROS,    Pulmonary exam normal        Cardiovascular hypertension, Pt. on medications and Pt. on home beta blockers + CAD and + CABG  Normal cardiovascular exam+ Valvular Problems/Murmurs AS      Neuro/Psych Depression negative neurological ROS     GI/Hepatic Neg liver ROS, GERD  Medicated and Controlled,  Endo/Other  negative endocrine ROS  Renal/GU negative Renal ROS     Musculoskeletal   Abdominal   Peds  Hematology   Anesthesia Other Findings   Reproductive/Obstetrics                            Anesthesia Physical  Anesthesia Plan  ASA: III  Anesthesia Plan: General   Post-op Pain Management:    Induction: Intravenous  PONV Risk Score and Plan: 2 and Propofol infusion and Treatment may vary due to age or medical condition  Airway Management Planned: Mask  Additional Equipment:   Intra-op Plan:   Post-operative Plan:   Informed Consent: I have reviewed the patients History and Physical, chart, labs and discussed the procedure including the risks, benefits and alternatives for the proposed anesthesia with the patient or authorized representative who has indicated his/her understanding and acceptance.     Dental advisory given  Plan Discussed with: CRNA and Anesthesiologist  Anesthesia Plan Comments:        Anesthesia Quick Evaluation

## 2019-08-18 ENCOUNTER — Ambulatory Visit (HOSPITAL_COMMUNITY): Payer: PPO | Admitting: Anesthesiology

## 2019-08-18 ENCOUNTER — Other Ambulatory Visit: Payer: Self-pay

## 2019-08-18 ENCOUNTER — Encounter (HOSPITAL_COMMUNITY)
Admission: RE | Disposition: A | Discharge status: Home / Self Care | Payer: PPO | Source: Home / Self Care | Attending: Cardiology

## 2019-08-18 ENCOUNTER — Encounter (HOSPITAL_COMMUNITY): Payer: Self-pay | Admitting: *Deleted

## 2019-08-18 ENCOUNTER — Ambulatory Visit (HOSPITAL_COMMUNITY)
Admission: RE | Admit: 2019-08-18 | Discharge: 2019-08-18 | Disposition: A | Discharge status: Home / Self Care | Payer: PPO | Attending: Cardiology | Admitting: Cardiology

## 2019-08-18 DIAGNOSIS — I2510 Atherosclerotic heart disease of native coronary artery without angina pectoris: Secondary | ICD-10-CM | POA: Diagnosis not present

## 2019-08-18 DIAGNOSIS — K219 Gastro-esophageal reflux disease without esophagitis: Secondary | ICD-10-CM | POA: Insufficient documentation

## 2019-08-18 DIAGNOSIS — I495 Sick sinus syndrome: Secondary | ICD-10-CM | POA: Diagnosis not present

## 2019-08-18 DIAGNOSIS — F418 Other specified anxiety disorders: Secondary | ICD-10-CM | POA: Diagnosis not present

## 2019-08-18 DIAGNOSIS — Z888 Allergy status to other drugs, medicaments and biological substances status: Secondary | ICD-10-CM | POA: Diagnosis not present

## 2019-08-18 DIAGNOSIS — I350 Nonrheumatic aortic (valve) stenosis: Secondary | ICD-10-CM | POA: Diagnosis not present

## 2019-08-18 DIAGNOSIS — Z79899 Other long term (current) drug therapy: Secondary | ICD-10-CM | POA: Diagnosis not present

## 2019-08-18 DIAGNOSIS — I480 Paroxysmal atrial fibrillation: Secondary | ICD-10-CM

## 2019-08-18 DIAGNOSIS — Z8249 Family history of ischemic heart disease and other diseases of the circulatory system: Secondary | ICD-10-CM | POA: Diagnosis not present

## 2019-08-18 DIAGNOSIS — Z950 Presence of cardiac pacemaker: Secondary | ICD-10-CM | POA: Diagnosis not present

## 2019-08-18 DIAGNOSIS — I4891 Unspecified atrial fibrillation: Secondary | ICD-10-CM | POA: Diagnosis not present

## 2019-08-18 DIAGNOSIS — Z7901 Long term (current) use of anticoagulants: Secondary | ICD-10-CM | POA: Insufficient documentation

## 2019-08-18 DIAGNOSIS — Z882 Allergy status to sulfonamides status: Secondary | ICD-10-CM | POA: Diagnosis not present

## 2019-08-18 DIAGNOSIS — E785 Hyperlipidemia, unspecified: Secondary | ICD-10-CM | POA: Insufficient documentation

## 2019-08-18 DIAGNOSIS — J8410 Pulmonary fibrosis, unspecified: Secondary | ICD-10-CM | POA: Diagnosis not present

## 2019-08-18 DIAGNOSIS — Z951 Presence of aortocoronary bypass graft: Secondary | ICD-10-CM | POA: Diagnosis not present

## 2019-08-18 DIAGNOSIS — N400 Enlarged prostate without lower urinary tract symptoms: Secondary | ICD-10-CM | POA: Insufficient documentation

## 2019-08-18 DIAGNOSIS — I10 Essential (primary) hypertension: Secondary | ICD-10-CM | POA: Insufficient documentation

## 2019-08-18 DIAGNOSIS — M1712 Unilateral primary osteoarthritis, left knee: Secondary | ICD-10-CM | POA: Insufficient documentation

## 2019-08-18 HISTORY — PX: CARDIOVERSION: SHX1299

## 2019-08-18 SURGERY — CARDIOVERSION
Anesthesia: General

## 2019-08-18 MED ORDER — PROPOFOL 10 MG/ML IV BOLUS
INTRAVENOUS | Status: DC | PRN
  Administered 2019-08-18: 80 mg via INTRAVENOUS

## 2019-08-18 MED ORDER — SODIUM CHLORIDE 0.9 % IV SOLN
INTRAVENOUS | Status: AC | PRN
  Administered 2019-08-18: 500 mL via INTRAVENOUS

## 2019-08-18 MED ORDER — LIDOCAINE 2% (20 MG/ML) 5 ML SYRINGE
INTRAMUSCULAR | Status: DC | PRN
  Administered 2019-08-18: 60 mg via INTRAVENOUS

## 2019-08-18 NOTE — Transfer of Care (Signed)
Immediate Anesthesia Transfer of Care Note  Patient: Russell Griffith  Procedure(s) Performed: CARDIOVERSION (N/A )  Patient Location: Endoscopy Unit  Anesthesia Type:General  Level of Consciousness: drowsy and patient cooperative  Airway & Oxygen Therapy: Patient Spontanous Breathing  Post-op Assessment: Report given to RN, Post -op Vital signs reviewed and stable and Patient moving all extremities X 4  Post vital signs: Reviewed and stable  Last Vitals:  Vitals Value Taken Time  BP    Temp    Pulse    Resp    SpO2      Last Pain:  Vitals:   08/18/19 0746  TempSrc: Oral  PainSc: 0-No pain         Complications: No apparent anesthesia complications

## 2019-08-18 NOTE — CV Procedure (Signed)
    Electrical Cardioversion Procedure Note Russell Griffith [id number] 11/02/1946  Procedure: Electrical Cardioversion Indications:  Atrial Fibrillation  Time Out: Verified patient identification, verified procedure,medications/allergies/relevent history reviewed, required imaging and test results available.  Performed  Procedure Details  The patient was NPO after midnight. Anesthesia was administered at the beside  by Dr. Tobias Alexander with  propofol.  Cardioversion was performed with synchronized biphasic defibrillation via AP pads with 120 joules.  1 attempt(s) were performed.  The patient converted to normal sinus rhythm. The patient tolerated the procedure well   IMPRESSION:  Successful cardioversion of atrial fibrillation    Russell Griffith 08/18/2019, 8:41 AM

## 2019-08-18 NOTE — Discharge Instructions (Signed)

## 2019-08-18 NOTE — Anesthesia Postprocedure Evaluation (Addendum)
Anesthesia Post Note  Patient: Russell Griffith  Procedure(s) Performed: CARDIOVERSION (N/A )     Patient location during evaluation: Endoscopy Anesthesia Type: General Level of consciousness: sedated Pain management: pain level controlled Vital Signs Assessment: post-procedure vital signs reviewed and stable Respiratory status: spontaneous breathing and respiratory function stable Cardiovascular status: stable Postop Assessment: no apparent nausea or vomiting Anesthetic complications: no    Last Vitals:  Vitals:   08/18/19 0843 08/18/19 0854  BP: (!) 144/68 126/78  Pulse:  70  Resp: (!) 21 19  Temp: 36.7 C   SpO2: 96% 95%    Last Pain:  Vitals:   08/18/19 0843  TempSrc: Oral  PainSc: 0-No pain                 Ahilyn Nell DANIEL

## 2019-08-18 NOTE — Addendum Note (Signed)
Addendum  created 08/18/19 0924 by Duane Boston, MD   Clinical Note Signed

## 2019-08-18 NOTE — Interval H&P Note (Signed)
History and Physical Interval Note:  08/18/2019 8:33 AM  Russell Griffith  has presented today for surgery, with the diagnosis of afib.  The various methods of treatment have been discussed with the patient and family. After consideration of risks, benefits and other options for treatment, the patient has consented to  Procedure(s): CARDIOVERSION (N/A) as a surgical intervention.  The patient's history has been reviewed, patient examined, no change in status, stable for surgery.  I have reviewed the patient's chart and labs.  Questions were answered to the patient's satisfaction.     [employer]

## 2019-08-19 ENCOUNTER — Encounter (HOSPITAL_COMMUNITY): Payer: Self-pay | Admitting: Cardiology

## 2019-08-23 ENCOUNTER — Encounter: Payer: Self-pay | Admitting: Internal Medicine

## 2019-08-23 ENCOUNTER — Other Ambulatory Visit (INDEPENDENT_AMBULATORY_CARE_PROVIDER_SITE_OTHER): Payer: PPO

## 2019-08-23 ENCOUNTER — Ambulatory Visit (INDEPENDENT_AMBULATORY_CARE_PROVIDER_SITE_OTHER): Payer: PPO | Admitting: Internal Medicine

## 2019-08-23 ENCOUNTER — Other Ambulatory Visit: Payer: Self-pay

## 2019-08-23 VITALS — BP 110/78 | HR 75 | Temp 97.8°F | Ht 71.0 in | Wt 201.0 lb

## 2019-08-23 DIAGNOSIS — N320 Bladder-neck obstruction: Secondary | ICD-10-CM | POA: Diagnosis not present

## 2019-08-23 DIAGNOSIS — I480 Paroxysmal atrial fibrillation: Secondary | ICD-10-CM

## 2019-08-23 DIAGNOSIS — E785 Hyperlipidemia, unspecified: Secondary | ICD-10-CM

## 2019-08-23 DIAGNOSIS — K22719 Barrett's esophagus with dysplasia, unspecified: Secondary | ICD-10-CM

## 2019-08-23 DIAGNOSIS — I25810 Atherosclerosis of coronary artery bypass graft(s) without angina pectoris: Secondary | ICD-10-CM

## 2019-08-23 DIAGNOSIS — Z23 Encounter for immunization: Secondary | ICD-10-CM

## 2019-08-23 LAB — URINALYSIS
Bilirubin Urine: NEGATIVE
Hgb urine dipstick: NEGATIVE
Ketones, ur: NEGATIVE
Leukocytes,Ua: NEGATIVE
Nitrite: NEGATIVE
Specific Gravity, Urine: 1.025 (ref 1.000–1.030)
Total Protein, Urine: NEGATIVE
Urine Glucose: NEGATIVE
Urobilinogen, UA: 2 — AB (ref 0.0–1.0)
pH: 5.5 (ref 5.0–8.0)

## 2019-08-23 LAB — LIPID PANEL
Cholesterol: 111 mg/dL (ref 0–200)
HDL: 34.5 mg/dL — ABNORMAL LOW (ref >39.00)
LDL Cholesterol: 67 mg/dL (ref 0–99)
NonHDL: 76.68
Total CHOL/HDL Ratio: 3
Triglycerides: 46 mg/dL (ref 0.0–149.0)
VLDL: 9.2 mg/dL (ref 0.0–40.0)

## 2019-08-23 LAB — PSA: PSA: 0.96 ng/mL (ref 0.10–4.00)

## 2019-08-23 LAB — T4, FREE: Free T4: 1 ng/dL (ref 0.60–1.60)

## 2019-08-23 LAB — TSH: TSH: 5.62 u[IU]/mL — ABNORMAL HIGH (ref 0.35–4.50)

## 2019-08-23 NOTE — Assessment & Plan Note (Signed)
Lipitor 

## 2019-08-23 NOTE — Patient Instructions (Signed)
These suggestions will probably help you to improve your metabolism if you are not overweight and to lose weight if you are overweight: 1.  Reduce your consumption of sugars and starches.  Eliminate high fructose corn syrup from your diet.  Reduce your consumption of processed foods.  For desserts try to have seasonal fruits, berries, nuts, cheeses or dark chocolate with more than 70% cacao. 2.  Do not snack 3.  You do not have to eat breakfast.  If you choose to have breakfast-eat plain greek yogurt, eggs, oatmeal (without sugar) 4.  Drink water, freshly brewed unsweetened tea (green, black or herbal) or coffee.  Do not drink sodas including diet sodas , juices, beverages sweetened with artificial sweeteners. 5.  Reduce your consumption of refined grains. 6.  Avoid protein drinks such as Optifast, Slim fast etc. Eat chicken, fish, meat, dairy and beans for your sources of protein 7.  Natural unprocessed fats like cold pressed virgin olive oil, butter, coconut oil are good for you.  Eat avocados 8.  Increase your consumption of fiber.  Fruits, berries, vegetables, whole grains, flaxseeds, Chia seeds, beans, popcorn, nuts, oatmeal are good sources of fiber 9.  Use vinegar in your diet, i.e. apple cider vinegar, red wine or balsamic vinegar 10.  You can try fasting.  For example you can skip breakfast and lunch every other day (24-hour fast) 11.  Stress reduction, good night sleep, relaxation, meditation, yoga and other physical activity is likely to help you to maintain low weight too. 12.  If you drink alcohol, limit your alcohol intake to no more than 2 drinks a day.   Mediterranean diet is good for you. ([employer] has a typical Mediterranean cuisine menu) The Mediterranean diet is a way of eating based on the traditional cuisine of countries bordering the [place] Sea. While there is no single definition of the Mediterranean diet, it is typically high in vegetables, fruits, whole grains,  beans, nut and seeds, and olive oil. The main components of Mediterranean diet include: . Daily consumption of vegetables, fruits, whole grains and healthy fats  . Weekly intake of fish, poultry, beans and eggs  . Moderate portions of dairy products  . Limited intake of red meat Other important elements of the Mediterranean diet are sharing meals with family and friends, enjoying a glass of red wine and being physically active. Health benefits of a Mediterranean diet: A traditional Mediterranean diet consisting of large quantities of fresh fruits and vegetables, nuts, fish and olive oil-coupled with physical activity-can reduce your risk of serious mental and physical health problems by: Preventing heart disease and strokes. Following a Mediterranean diet limits your intake of refined breads, processed foods, and red meat, and encourages drinking red wine instead of hard liquor-all factors that can help prevent heart disease and stroke. Keeping you agile. If you're an older adult, the nutrients gained with a Mediterranean diet may reduce your risk of developing muscle weakness and other signs of frailty by about 70 percent. Reducing the risk of Alzheimer's. Research suggests that the Mediterranean diet may improve cholesterol, blood sugar levels, and overall blood vessel health, which in turn may reduce your risk of Alzheimer's disease or dementia. Halving the risk of Parkinson's disease. The high levels of antioxidants in the Mediterranean diet can prevent cells from undergoing a damaging process called oxidative stress, thereby cutting the risk of Parkinson's disease in half. Increasing longevity. By reducing your risk of developing heart disease or cancer with the Mediterranean diet,   you're reducing your risk of death at any age by 20%. Protecting against type 2 diabetes. A Mediterranean diet is rich in fiber which digests slowly, prevents huge swings in blood sugar, and can help you maintain a  healthy weight.    Cabbage soup recipe that will not make you gain weight: Take 1 small head of cabbage, 1 average pack of celery, 4 green peppers, 4 onions, 2 cans diced tomatoes (they are not available without salt), salt and spices to taste.  Chop cabbage, celery, peppers and onions.  And tomatoes and 2-2.5 liters (2.5 quarts) of water so that it would just cover the vegetables.  Bring to boil.  Add spices and salt.  Turn heat to low/medium and simmer for 20-25 minutes.  Naturally, you can make a smaller batch and change some of the ingredients.  

## 2019-08-23 NOTE — Progress Notes (Signed)
Subjective:  Patient ID: Russell Griffith, male    DOB: [birth year]  Age: 73 y.o. MRN: [id number]  CC: No chief complaint on file.   HPI Russell Griffith presents for a well exam S/p ablation in Feb 2020 and cardioversion in sept 2020 Follow-up on coronary disease and dyslipidemia  Outpatient Medications Prior to Visit  Medication Sig Dispense Refill  . acetaminophen (TYLENOL) 500 MG tablet Take 1,000 mg by mouth every 6 (six) hours as needed for moderate pain or headache.    Marland Penn atorvastatin (LIPITOR) 20 MG tablet Take 20 mg by mouth every evening.     . Cholecalciferol (D3-1000) 25 MCG (1000 UT) capsule Take 1,000 Units by mouth daily with supper.     . NONFORMULARY OR COMPOUNDED ITEM Apply 1 application topically daily as needed (rash). Cetaphil + triamcinolone cream    . pantoprazole (PROTONIX) 40 MG tablet Take 40 mg by mouth daily.    . polyvinyl alcohol (LIQUIFILM TEARS) 1.4 % ophthalmic solution Place 1 drop into both eyes every 8 (eight) hours as needed for dry eyes.    . ramipril (ALTACE) 2.5 MG capsule Take 2.5 mg by mouth every evening.     . rivaroxaban (XARELTO) 20 MG TABS tablet Take 20 mg by mouth daily with supper.     No facility-administered medications prior to visit.     ROS: Review of Systems  Constitutional: Negative for appetite change, fatigue and unexpected weight change.  HENT: Negative for congestion, nosebleeds, sneezing, sore throat and trouble swallowing.   Eyes: Negative for itching and visual disturbance.  Respiratory: Negative for cough.   Cardiovascular: Positive for palpitations. Negative for chest pain and leg swelling.  Gastrointestinal: Negative for abdominal distention, blood in stool, diarrhea and nausea.  Genitourinary: Negative for frequency and hematuria.  Musculoskeletal: Negative for back pain, gait problem, joint swelling and neck pain.  Skin: Negative for rash.  Neurological: Negative for dizziness, tremors, speech difficulty  and weakness.  Psychiatric/Behavioral: Negative for agitation, dysphoric mood, sleep disturbance and suicidal ideas. The patient is not nervous/anxious.     Objective:  BP 110/78 (BP Location: Left Arm, Patient Position: Sitting, Cuff Size: Normal)   Pulse 75   Temp 97.8 F (36.6 C) (Oral)   Ht 5\' 11"  (1.803 m)   Wt 201 lb (91.2 kg)   SpO2 95%   BMI 28.03 kg/m   BP Readings from Last 3 Encounters:  08/23/19 110/78  08/18/19 126/78  06/28/19 138/70    Wt Readings from Last 3 Encounters:  08/23/19 201 lb (91.2 kg)  08/18/19 193 lb (87.5 kg)  08/14/19 190 lb (86.2 kg)    Physical Exam Constitutional:      General: He is not in acute distress.    Appearance: He is well-developed.     Comments: NAD  Eyes:     Conjunctiva/sclera: Conjunctivae normal.     Pupils: Pupils are equal, round, and reactive to light.  Neck:     Musculoskeletal: Normal range of motion.     Thyroid: No thyromegaly.     Vascular: No JVD.  Cardiovascular:     Rate and Rhythm: Normal rate and regular rhythm.     Heart sounds: Normal heart sounds. No murmur. No friction rub. No gallop.   Pulmonary:     Effort: Pulmonary effort is normal. No respiratory distress.     Breath sounds: Normal breath sounds. No wheezing or rales.  Chest:     Chest wall: No tenderness.  Abdominal:  General: Bowel sounds are normal. There is no distension.     Palpations: Abdomen is soft. There is no mass.     Tenderness: There is no abdominal tenderness. There is no guarding or rebound.  Musculoskeletal: Normal range of motion.        General: No tenderness.  Lymphadenopathy:     Cervical: No cervical adenopathy.  Skin:    General: Skin is warm and dry.     Findings: No rash.  Neurological:     Mental Status: He is alert and oriented to person, place, and time.     Cranial Nerves: No cranial nerve deficit.     Motor: No abnormal muscle tone.     Coordination: Coordination normal.     Gait: Gait normal.      Deep Tendon Reflexes: Reflexes are normal and symmetric.  Psychiatric:        Behavior: Behavior normal.        Thought Content: Thought content normal.        Judgment: Judgment normal.   rectal def to Dr Ardis Hughs Regular rhythm with PVCs  Lab Results  Component Value Date   WBC 6.4 08/15/2019   HGB 14.4 08/15/2019   HCT 44.3 08/15/2019   PLT 163 08/15/2019   GLUCOSE 100 (H) 08/15/2019   CHOL 110 08/18/2018   TRIG 48.0 08/18/2018   HDL 31.90 (L) 08/18/2018   LDLCALC 68 08/18/2018   ALT 32 08/18/2018   AST 29 08/18/2018   NA 141 08/15/2019   K 4.2 08/15/2019   CL 103 08/15/2019   CREATININE 1.05 08/15/2019   BUN 20 08/15/2019   CO2 24 08/15/2019   TSH 4.61 (H) 08/18/2018   PSA 1.24 08/18/2018   INR 1.53 (H) 07/15/2012   HGBA1C 5.8 07/30/2015    No results found.  Assessment & Plan:   Walker Kehr, MD

## 2019-08-23 NOTE — Assessment & Plan Note (Addendum)
S/p ablation in Feb 2020 and cardioversion in sept 2020 Xarelto

## 2019-08-31 DIAGNOSIS — Z85828 Personal history of other malignant neoplasm of skin: Secondary | ICD-10-CM | POA: Diagnosis not present

## 2019-08-31 DIAGNOSIS — D692 Other nonthrombocytopenic purpura: Secondary | ICD-10-CM | POA: Diagnosis not present

## 2019-08-31 DIAGNOSIS — L821 Other seborrheic keratosis: Secondary | ICD-10-CM | POA: Diagnosis not present

## 2019-08-31 DIAGNOSIS — D1801 Hemangioma of skin and subcutaneous tissue: Secondary | ICD-10-CM | POA: Diagnosis not present

## 2019-09-01 ENCOUNTER — Ambulatory Visit (HOSPITAL_COMMUNITY)
Admission: RE | Admit: 2019-09-01 | Discharge: 2019-09-01 | Disposition: A | Discharge status: Home / Self Care | Payer: PPO | Source: Ambulatory Visit | Attending: Physician Assistant | Admitting: Physician Assistant

## 2019-09-01 ENCOUNTER — Other Ambulatory Visit: Payer: Self-pay

## 2019-09-01 ENCOUNTER — Encounter (HOSPITAL_COMMUNITY): Payer: Self-pay | Admitting: Physician Assistant

## 2019-09-01 VITALS — BP 120/76 | HR 74 | Ht 71.0 in | Wt 200.4 lb

## 2019-09-01 DIAGNOSIS — I4819 Other persistent atrial fibrillation: Secondary | ICD-10-CM | POA: Diagnosis not present

## 2019-09-01 DIAGNOSIS — Z79899 Other long term (current) drug therapy: Secondary | ICD-10-CM | POA: Insufficient documentation

## 2019-09-01 DIAGNOSIS — Z950 Presence of cardiac pacemaker: Secondary | ICD-10-CM | POA: Diagnosis not present

## 2019-09-01 DIAGNOSIS — K2270 Barrett's esophagus without dysplasia: Secondary | ICD-10-CM | POA: Insufficient documentation

## 2019-09-01 DIAGNOSIS — K219 Gastro-esophageal reflux disease without esophagitis: Secondary | ICD-10-CM | POA: Insufficient documentation

## 2019-09-01 DIAGNOSIS — I10 Essential (primary) hypertension: Secondary | ICD-10-CM | POA: Diagnosis not present

## 2019-09-01 DIAGNOSIS — Z8249 Family history of ischemic heart disease and other diseases of the circulatory system: Secondary | ICD-10-CM | POA: Insufficient documentation

## 2019-09-01 DIAGNOSIS — I350 Nonrheumatic aortic (valve) stenosis: Secondary | ICD-10-CM | POA: Insufficient documentation

## 2019-09-01 DIAGNOSIS — E785 Hyperlipidemia, unspecified: Secondary | ICD-10-CM | POA: Diagnosis not present

## 2019-09-01 DIAGNOSIS — I2510 Atherosclerotic heart disease of native coronary artery without angina pectoris: Secondary | ICD-10-CM | POA: Diagnosis not present

## 2019-09-01 DIAGNOSIS — Z951 Presence of aortocoronary bypass graft: Secondary | ICD-10-CM | POA: Diagnosis not present

## 2019-09-01 DIAGNOSIS — I4892 Unspecified atrial flutter: Secondary | ICD-10-CM | POA: Insufficient documentation

## 2019-09-01 MED ORDER — MULTAQ 400 MG PO TABS: 400.0000 mg | ORAL_TABLET | Freq: Two times a day (BID) | ORAL | 6 refills | Status: DC

## 2019-09-01 NOTE — Progress Notes (Signed)
Primary Care Physician: Cassandria Anger, MD Primary Cardiologist: Dr Percival Spanish Primary Electrophysiologist: Dr Caryl Comes Referring Physician: Dr Massie Bougie Burman Russell Griffith is a 73 y.o. male with a history of CAD s/p CABG, sinus node dysfunction s/p pacemaker implant, HTN, HLD, carotid artery disease, and persistent atrial fibrillation and atrial flutter who presents for follow up in the [facility].  The patient was initially diagnosed with atrial flutter and underwent CTI ablation with Dr Lovena Le. Patient was later diagnosed with atrial fibrillation and had an afib ablation at [facility]. Unfortunately, his afib returned and he is now s/p repeat ablation with Dr Rayann Heman. He did have a hematoma post ablation which has resolved.  On follow up today, patient is s/p DCCV on 08/18/19. He reports that he feels better in SR with more energy. He denies any missed doses of anticoagulation.  Today, he denies symptoms of palpitations, chest pain, orthopnea, PND, lower extremity edema, dizziness, presyncope, syncope, snoring, daytime somnolence, bleeding, or neurologic sequela. The patient is tolerating medications without difficulties and is otherwise without complaint today.   he has a BMI of Body mass index is 27.95 kg/m.Marland Cure Filed Weights   09/01/19 0905  Weight: 90.9 kg    Family History  Problem Relation Age of Onset  . Coronary artery disease Other 72  . Diabetes Other   . Multiple myeloma Mother   . Diabetes Father   . Renal Disease Father   . Hyperlipidemia Other   . Hypertension Other   . Colon cancer Neg Hx   . Esophageal cancer Neg Hx   . Stomach cancer Neg Hx   . Rectal cancer Neg Hx      Atrial Fibrillation Management history:  Previous antiarrhythmic drugs: Tikosyn Previous cardioversions: 10/14/18, 08/18/19 Previous ablations: remote at [facility], 01/25/19 CHADS2VASC score: 3 (age, HTN, CAD) Anticoagulation history: Xarelto   Past Medical History:   Diagnosis Date  . Aortic stenosis    Mild, echo, April, 2014  . BPH (benign prostatic hyperplasia)   . CAD (coronary artery disease)    a. s/p CABG  . Carotid artery disease ([place])   . Colonic polyp   . Diverticulosis   . Elevated bilirubin    Mild chronic elevation, 2.0 January, 2011 stable  . GERD (gastroesophageal reflux disease)    Barrett's esophagus  . HTN (hypertension)   . Hyperlipidemia    Low HDL  . 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 Persistent atrial fibrillation ([place])    a. s/p PVI at [facility]  . Primary osteoarthritis of left knee    Mild  . Prolapsed internal hemorrhoids, grade 3 08/12/2015  . PVC's (premature ventricular contractions)   . Tubular adenoma of colon    Past Surgical History:  Procedure Laterality Date  . ABLATION     PVI at [facility]  . ATRIAL FIBRILLATION ABLATION N/A 01/25/2019   Procedure: ATRIAL FIBRILLATION ABLATION;  Surgeon: [name], MD;  Location: [place] CV [facility];  Service: Cardiovascular;  Laterality: N/A;  . CARDIOVERSION N/A 10/14/2018   Procedure: CARDIOVERSION;  Surgeon: Fay Records, MD;  Location: [place];  Service: Cardiovascular;  Laterality: N/A;  . CARDIOVERSION N/A 08/18/2019   Procedure: CARDIOVERSION;  Surgeon: Jerline Pain, MD;  Location: [facility] ENDOSCOPY;  Service: Cardiovascular;  Laterality: N/A;  . COLONOSCOPY    . CORONARY ARTERY BYPASS GRAFT  2000   CABG X5  . ESOPHAGOGASTRODUODENOSCOPY    . HEMORRHOID  BANDING    . INTRAVASCULAR PRESSURE WIRE/FFR STUDY N/A 01/05/2018   Procedure: INTRAVASCULAR PRESSURE WIRE/FFR STUDY;  Surgeon: Sherren Mocha, MD;  Location: [place] CV LAB;  Service: Cardiovascular;  Laterality: N/A;  . LEFT HEART CATH AND CORS/GRAFTS ANGIOGRAPHY N/A 01/05/2018   Procedure: LEFT HEART CATH AND CORS/GRAFTS ANGIOGRAPHY;  Surgeon: Sherren Mocha, MD;  Location: [place] CV LAB;  Service: Cardiovascular;  Laterality: N/A;  . PERMANENT PACEMAKER INSERTION N/A 07/15/2012    Procedure: PERMANENT PACEMAKER INSERTION;  Surgeon: Deboraha Sprang, MD;  Location: [facility] CATH LAB;  Service: Cardiovascular;  Laterality: N/A;  . rotator cuff surgery Right 2017  . TONSILLECTOMY  1956    Current Outpatient Medications  Medication Sig Dispense Refill  . acetaminophen (TYLENOL) 500 MG tablet Take 1,000 mg by mouth every 6 (six) hours as needed for moderate pain or headache.    Marland Childress atorvastatin (LIPITOR) 20 MG tablet Take 20 mg by mouth every evening.     . Cholecalciferol (D3-1000) 25 MCG (1000 UT) capsule Take 1,000 Units by mouth daily with supper.     . NONFORMULARY OR COMPOUNDED ITEM Apply 1 application topically daily as needed (rash). Cetaphil + triamcinolone cream    . pantoprazole (PROTONIX) 40 MG tablet Take 40 mg by mouth daily.    . polyvinyl alcohol (LIQUIFILM TEARS) 1.4 % ophthalmic solution Place 1 drop into both eyes every 8 (eight) hours as needed for dry eyes.    . ramipril (ALTACE) 2.5 MG capsule Take 2.5 mg by mouth every evening.     . rivaroxaban (XARELTO) 20 MG TABS tablet Take 20 mg by mouth daily with supper.    . dronedarone (MULTAQ) 400 MG tablet Take 1 tablet (400 mg total) by mouth 2 (two) times daily with a meal. 60 tablet 6   No current facility-administered medications for this encounter.     Allergies  Allergen Reactions  . Cayenne Other (See Comments)    Sweats w/paprika too  . Niacin And Related Other (See Comments)    Upset stomach  . Sulfonamide Derivatives Other (See Comments)    "have no idea; mother told me I was allergic to"    Social History   Socioeconomic History  . Marital status: Married    Spouse name: Not on file  . Number of children: 2  . Years of education: 3  . Highest education level: Not on file  Occupational History  . Occupation: Technical brewer: Engineer, mining    Comment: [employer]  Social Needs  . Financial resource strain: Not on file  . Food insecurity    Worry: Not on  file    Inability: Not on file  . Transportation needs    Medical: Not on file    Non-medical: Not on file  Tobacco Use  . Smoking status: Never Smoker  . Smokeless tobacco: Never Used  Substance and Sexual Activity  . Alcohol use: Yes    Alcohol/week: 21.0 standard drinks    Types: 21 Glasses of wine per week  . Drug use: No  . Sexual activity: Yes    Partners: Female  Lifestyle  . Physical activity    Days per week: Not on file    Minutes per session: Not on file  . Stress: Not on file  Relationships  . Social Herbalist on phone: Not on file    Gets together: Not on file    Attends religious service: Not on  file    Active member of club or organization: Not on file    Attends meetings of clubs or organizations: Not on file    Relationship status: Not on file  . Intimate partner violence    Fear of current or ex partner: Not on file    Emotionally abused: Not on file    Physically abused: Not on file    Forced sexual activity: Not on file  Other Topics Concern  . Not on file  Social History Narrative   [employer] [place], [place].  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  2 daughters-'75, '79; 1 grandchild; step-daughter and step grandson.  Regular exercise-yes, runs 1.5-2 mi 4x/wk, also eliptical      Patient signed a Designated Party Release to allow his wife, Unnamed Zeien, to have access to his medical records/ information.     ROS- All systems are reviewed and negative except as per the HPI above.  Physical Exam: Vitals:   09/01/19 0905  BP: 120/76  Pulse: 74  Weight: 90.9 kg  Height: '5\' 11"'  (1.803 m)    GEN- The patient is well appearing, alert and oriented x 3 today.   HEENT-head normocephalic, atraumatic, sclera clear, conjunctiva pink, hearing intact, trachea midline. Lungs- Clear to ausculation bilaterally, normal work of breathing Heart- Regular rate and rhythm,  no murmurs, rubs or gallops  GI- soft, NT, ND, + BS Extremities- no clubbing, cyanosis, or edema MS- no significant deformity or atrophy Skin- no rash or lesion Psych- euthymic mood, full affect Neuro- strength and sensation are intact   Wt Readings from Last 3 Encounters:  09/01/19 90.9 kg  08/23/19 91.2 kg  08/18/19 87.5 kg    EKG - A paced rhythm with prolonged conduction HR 74, PAC, PR 348, QRS 86, QTc 432  Echo 11/11/18 demonstrated  - Left ventricle: The cavity size was normal. There was mild focal   basal hypertrophy of the septum. Systolic function was normal.   The estimated ejection fraction was in the range of 60% to 65%.   Wall motion was normal; there were no regional wall motion   abnormalities. The study is not technically sufficient to allow   evaluation of LV diastolic function. - Aortic valve: Trileaflet; mildly thickened, mildly calcified   leaflets. There was mild stenosis. There was mild regurgitation.   Valve area (VTI): 1.47 cm^2. - Mitral valve: There was mild regurgitation. - Left atrium: The atrium was mildly dilated. - Right ventricle: Pacer wire or catheter noted in right ventricle. - Tricuspid valve: There was mild regurgitation. - Pulmonary arteries: Systolic pressure was moderately increased.   PA peak pressure: 52 mm Hg (S).  Epic records are reviewed at length today  Assessment and Plan:  1. Persistent atrial fibrillation S/p repeat ablation with Dr Rayann Heman 01/25/19.   S/p DCCV on 08/18/19. Patient appears to be maintaining SR. We discussed AAD therapy today. After discussing the risks and benefits, will start Multaq 400 mg BID. Encouraged patient to take this with food. Continue Xarelto 20 mg daily.  This patients CHA2DS2-VASc Score and unadjusted Ischemic Stroke Rate (% per year) is equal to 3.2 % stroke rate/year from a score of 3  Above score calculated as 1 point each if present [CHF, HTN, DM, Vascular=MI/PAD/Aortic Plaque, Age if  65-74, or Male] Above score calculated as 2 points each if present [Age > 75, or Stroke/TIA/TE]  2. Sinus node dysfunction S/p PPM. Followed by  Dr Caryl Comes and device clinic..   3. CAD S/p CABG.  No anginal symptoms. Continue present therapy and risk factor modification.   4. HTN Stable, no changes today.   Follow up in the AF clinic in one week for ECG. Dr Rayann Heman as scheduled.   [facility] [address] [place], [place] [address] [phone] 09/01/2019 9:31 AM

## 2019-09-01 NOTE — Patient Instructions (Signed)
Start Multaq 400mg  twice a day with food

## 2019-09-08 ENCOUNTER — Ambulatory Visit (HOSPITAL_COMMUNITY)
Admission: RE | Admit: 2019-09-08 | Discharge: 2019-09-08 | Disposition: A | Discharge status: Home / Self Care | Payer: PPO | Source: Ambulatory Visit | Attending: Physician Assistant | Admitting: Physician Assistant

## 2019-09-08 ENCOUNTER — Other Ambulatory Visit: Payer: Self-pay

## 2019-09-08 ENCOUNTER — Encounter (HOSPITAL_COMMUNITY): Payer: Self-pay | Admitting: Physician Assistant

## 2019-09-08 VITALS — BP 126/64 | HR 71 | Ht 71.0 in | Wt 204.2 lb

## 2019-09-08 DIAGNOSIS — Z885 Allergy status to narcotic agent status: Secondary | ICD-10-CM | POA: Diagnosis not present

## 2019-09-08 DIAGNOSIS — Z883 Allergy status to other anti-infective agents status: Secondary | ICD-10-CM | POA: Insufficient documentation

## 2019-09-08 DIAGNOSIS — Z950 Presence of cardiac pacemaker: Secondary | ICD-10-CM | POA: Insufficient documentation

## 2019-09-08 DIAGNOSIS — K219 Gastro-esophageal reflux disease without esophagitis: Secondary | ICD-10-CM | POA: Diagnosis not present

## 2019-09-08 DIAGNOSIS — Z833 Family history of diabetes mellitus: Secondary | ICD-10-CM | POA: Insufficient documentation

## 2019-09-08 DIAGNOSIS — Z79899 Other long term (current) drug therapy: Secondary | ICD-10-CM | POA: Insufficient documentation

## 2019-09-08 DIAGNOSIS — I350 Nonrheumatic aortic (valve) stenosis: Secondary | ICD-10-CM | POA: Diagnosis not present

## 2019-09-08 DIAGNOSIS — Z807 Family history of other malignant neoplasms of lymphoid, hematopoietic and related tissues: Secondary | ICD-10-CM | POA: Insufficient documentation

## 2019-09-08 DIAGNOSIS — I495 Sick sinus syndrome: Secondary | ICD-10-CM | POA: Insufficient documentation

## 2019-09-08 DIAGNOSIS — I4819 Other persistent atrial fibrillation: Secondary | ICD-10-CM | POA: Diagnosis not present

## 2019-09-08 DIAGNOSIS — Z7901 Long term (current) use of anticoagulants: Secondary | ICD-10-CM | POA: Diagnosis not present

## 2019-09-08 DIAGNOSIS — Z8249 Family history of ischemic heart disease and other diseases of the circulatory system: Secondary | ICD-10-CM | POA: Diagnosis not present

## 2019-09-08 DIAGNOSIS — E785 Hyperlipidemia, unspecified: Secondary | ICD-10-CM | POA: Insufficient documentation

## 2019-09-08 DIAGNOSIS — I10 Essential (primary) hypertension: Secondary | ICD-10-CM | POA: Insufficient documentation

## 2019-09-08 DIAGNOSIS — I4892 Unspecified atrial flutter: Secondary | ICD-10-CM | POA: Diagnosis not present

## 2019-09-08 DIAGNOSIS — Z86010 Personal history of colonic polyps: Secondary | ICD-10-CM | POA: Diagnosis not present

## 2019-09-08 DIAGNOSIS — Z951 Presence of aortocoronary bypass graft: Secondary | ICD-10-CM | POA: Insufficient documentation

## 2019-09-08 DIAGNOSIS — I25810 Atherosclerosis of coronary artery bypass graft(s) without angina pectoris: Secondary | ICD-10-CM | POA: Insufficient documentation

## 2019-09-08 DIAGNOSIS — Z882 Allergy status to sulfonamides status: Secondary | ICD-10-CM | POA: Insufficient documentation

## 2019-09-08 NOTE — Progress Notes (Signed)
Primary Care Physician: Cassandria Anger, MD Primary Cardiologist: Dr Percival Spanish Primary Electrophysiologist: Dr Caryl Comes Referring Physician: Dr Massie Bougie Hagan Russell Griffith is a 73 y.o. male with a history of CAD s/p CABG, sinus node dysfunction s/p pacemaker implant, HTN, HLD, carotid artery disease, and persistent atrial fibrillation and atrial flutter who presents for follow up in the [facility].  The patient was initially diagnosed with atrial flutter and underwent CTI ablation with Dr Lovena Le. Patient was later diagnosed with atrial fibrillation and had an afib ablation at [facility]. Unfortunately, his afib returned and he is now s/p repeat ablation with Dr Rayann Heman. He did have a hematoma post ablation which has resolved. Patient is s/p DDCV on 08/18/19.   On follow up today, patient has not had any symptoms of afib. He was started on Multaq at the last visit and states that he is having side effects of indigestion and nausea.   Today, he denies symptoms of palpitations, chest pain, orthopnea, PND, lower extremity edema, dizziness, presyncope, syncope, snoring, daytime somnolence, bleeding, or neurologic sequela. + indigestion   he has a BMI of Body mass index is 28.48 kg/m.Russell Griffith Filed Weights   09/08/19 0859  Weight: 92.6 kg    Family History  Problem Relation Age of Onset  . Coronary artery disease Other 72  . Diabetes Other   . Multiple myeloma Mother   . Diabetes Father   . Renal Disease Father   . Hyperlipidemia Other   . Hypertension Other   . Colon cancer Neg Hx   . Esophageal cancer Neg Hx   . Stomach cancer Neg Hx   . Rectal cancer Neg Hx      Atrial Fibrillation Management history:  Previous antiarrhythmic drugs: Tikosyn, Multaq Previous cardioversions: 10/14/18, 08/18/19 Previous ablations: remote at [facility], 01/25/19 CHADS2VASC score: 3 (age, HTN, CAD) Anticoagulation history: Xarelto   Past Medical History:  Diagnosis Date  . Aortic  stenosis    Mild, echo, April, 2014  . BPH (benign prostatic hyperplasia)   . CAD (coronary artery disease)    a. s/p CABG  . Carotid artery disease ([place])   . Colonic polyp   . Diverticulosis   . Elevated bilirubin    Mild chronic elevation, 2.0 January, 2011 stable  . GERD (gastroesophageal reflux disease)    Barrett's esophagus  . HTN (hypertension)   . Hyperlipidemia    Low HDL  . 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 Persistent atrial fibrillation ([place])    a. s/p PVI at [facility]  . Primary osteoarthritis of left knee    Mild  . Prolapsed internal hemorrhoids, grade 3 08/12/2015  . PVC's (premature ventricular contractions)   . Tubular adenoma of colon    Past Surgical History:  Procedure Laterality Date  . ABLATION     PVI at [facility]  . ATRIAL FIBRILLATION ABLATION N/A 01/25/2019   Procedure: ATRIAL FIBRILLATION ABLATION;  Surgeon: [name], MD;  Location: [place] CV LAB;  Service: Cardiovascular;  Laterality: N/A;  . CARDIOVERSION N/A 10/14/2018   Procedure: CARDIOVERSION;  Surgeon: Fay Records, MD;  Location: [place];  Service: Cardiovascular;  Laterality: N/A;  . CARDIOVERSION N/A 08/18/2019   Procedure: CARDIOVERSION;  Surgeon: Jerline Pain, MD;  Location: [facility] ENDOSCOPY;  Service: Cardiovascular;  Laterality: N/A;  . COLONOSCOPY    . CORONARY ARTERY BYPASS GRAFT  2000   CABG X5  . ESOPHAGOGASTRODUODENOSCOPY    .  HEMORRHOID BANDING    . INTRAVASCULAR PRESSURE WIRE/FFR STUDY N/A 01/05/2018   Procedure: INTRAVASCULAR PRESSURE WIRE/FFR STUDY;  Surgeon: Sherren Mocha, MD;  Location: [place] CV LAB;  Service: Cardiovascular;  Laterality: N/A;  . LEFT HEART CATH AND CORS/GRAFTS ANGIOGRAPHY N/A 01/05/2018   Procedure: LEFT HEART CATH AND CORS/GRAFTS ANGIOGRAPHY;  Surgeon: Sherren Mocha, MD;  Location: [place] CV LAB;  Service: Cardiovascular;  Laterality: N/A;  . PERMANENT PACEMAKER INSERTION N/A 07/15/2012   Procedure: PERMANENT  PACEMAKER INSERTION;  Surgeon: Deboraha Sprang, MD;  Location: [facility] CATH LAB;  Service: Cardiovascular;  Laterality: N/A;  . rotator cuff surgery Right 2017  . TONSILLECTOMY  1956    Current Outpatient Medications  Medication Sig Dispense Refill  . acetaminophen (TYLENOL) 500 MG tablet Take 1,000 mg by mouth every 6 (six) hours as needed for moderate pain or headache.    Russell Mccalla atorvastatin (LIPITOR) 20 MG tablet Take 20 mg by mouth every evening.     . Cholecalciferol (D3-1000) 25 MCG (1000 UT) capsule Take 1,000 Units by mouth daily with supper.     . dronedarone (MULTAQ) 400 MG tablet Take 1 tablet (400 mg total) by mouth 2 (two) times daily with a meal. 60 tablet 6  . NONFORMULARY OR COMPOUNDED ITEM Apply 1 application topically daily as needed (rash). Cetaphil + triamcinolone cream    . pantoprazole (PROTONIX) 40 MG tablet Take 40 mg by mouth daily.    . polyvinyl alcohol (LIQUIFILM TEARS) 1.4 % ophthalmic solution Place 1 drop into both eyes every 8 (eight) hours as needed for dry eyes.    . ramipril (ALTACE) 2.5 MG capsule Take 2.5 mg by mouth every evening.     . rivaroxaban (XARELTO) 20 MG TABS tablet Take 20 mg by mouth daily with supper.     No current facility-administered medications for this encounter.     Allergies  Allergen Reactions  . Cayenne Other (See Comments)    Sweats w/paprika too  . Niacin And Related Other (See Comments)    Upset stomach  . Sulfonamide Derivatives Other (See Comments)    "have no idea; mother told me I was allergic to"    Social History   Socioeconomic History  . Marital status: Married    Spouse name: Not on file  . Number of children: 2  . Years of education: 65  . Highest education level: Not on file  Occupational History  . Occupation: Technical brewer: Engineer, mining    Comment: [employer]  Social Needs  . Financial resource strain: Not on file  . Food insecurity    Worry: Not on file    Inability:  Not on file  . Transportation needs    Medical: Not on file    Non-medical: Not on file  Tobacco Use  . Smoking status: Never Smoker  . Smokeless tobacco: Never Used  Substance and Sexual Activity  . Alcohol use: Yes    Alcohol/week: 21.0 standard drinks    Types: 21 Glasses of wine per week  . Drug use: No  . Sexual activity: Yes    Partners: Female  Lifestyle  . Physical activity    Days per week: Not on file    Minutes per session: Not on file  . Stress: Not on file  Relationships  . Social Herbalist on phone: Not on file    Gets together: Not on file    Attends religious service: Not  on file    Active member of club or organization: Not on file    Attends meetings of clubs or organizations: Not on file    Relationship status: Not on file  . Intimate partner violence    Fear of current or ex partner: Not on file    Emotionally abused: Not on file    Physically abused: Not on file    Forced sexual activity: Not on file  Other Topics Concern  . Not on file  Social History Narrative   [employer] [place], [place].  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  2 daughters-'75, '79; 1 grandchild; step-daughter and step grandson.  Regular exercise-yes, runs 1.5-2 mi 4x/wk, also eliptical      Patient signed a Designated Party Release to allow his wife, Russell Griffith, to have access to his medical records/ information.     ROS- All systems are reviewed and negative except as per the HPI above.  Physical Exam: Vitals:   09/08/19 0859  BP: 126/64  Pulse: 71  Weight: 92.6 kg  Height: '5\' 11"'  (1.803 m)   GEN- The patient is well appearing, alert and oriented x 3 today.   HEENT-head normocephalic, atraumatic, sclera clear, conjunctiva pink, hearing intact, trachea midline. Lungs- Clear to ausculation bilaterally, normal work of breathing Heart- Regular rate and rhythm, no murmurs, rubs or  gallops  GI- soft, NT, ND, + BS Extremities- no clubbing, cyanosis, or edema MS- no significant deformity or atrophy Skin- no rash or lesion Psych- euthymic mood, full affect Neuro- strength and sensation are intact   Wt Readings from Last 3 Encounters:  09/08/19 92.6 kg  09/01/19 90.9 kg  08/23/19 91.2 kg    EKG - A paced rhythm with prolonged conduction HR 71, PR 398, QRS 90, QTc 462  Echo 11/11/18 demonstrated  - Left ventricle: The cavity size was normal. There was mild focal   basal hypertrophy of the septum. Systolic function was normal.   The estimated ejection fraction was in the range of 60% to 65%.   Wall motion was normal; there were no regional wall motion   abnormalities. The study is not technically sufficient to allow   evaluation of LV diastolic function. - Aortic valve: Trileaflet; mildly thickened, mildly calcified   leaflets. There was mild stenosis. There was mild regurgitation.   Valve area (VTI): 1.47 cm^2. - Mitral valve: There was mild regurgitation. - Left atrium: The atrium was mildly dilated. - Right ventricle: Pacer wire or catheter noted in right ventricle. - Tricuspid valve: There was mild regurgitation. - Pulmonary arteries: Systolic pressure was moderately increased.   PA peak pressure: 52 mm Hg (S).  Epic records are reviewed at length today  Assessment and Plan:  1. Persistent atrial fibrillation S/p repeat ablation with Dr Rayann Heman 01/25/19.   S/p DCCV on 08/18/19. Patient appears to be maintaining SR. Continue Multaq 400 mg BID. Patient having some GI side effects. Will plan to continue for one more week to see if SE resolve. We discussed other AAD options today if Multaq SE prove intolerable.  Continue Xarelto 20 mg daily.  This patients CHA2DS2-VASc Score and unadjusted Ischemic Stroke Rate (% per year) is equal to 3.2 % stroke rate/year from a score of 3  Above score calculated as 1 point each if present [CHF, HTN, DM,  Vascular=MI/PAD/Aortic Plaque, Age if 65-74, or Male] Above score calculated as 2 points each if present [Age > 75,  or Stroke/TIA/TE]  2. Sinus node dysfunction S/p PPM. Followed by Dr Caryl Comes and the Device clinic.    3. CAD S/p CABG.  No anginal symptoms. Continue present therapy and risk factor modification.   4. HTN Stable, no changes today.   Follow up with Dr Rayann Heman as scheduled.    [facility] [address] [place], [place] [address] [phone] 09/08/2019 9:37 AM

## 2019-09-22 ENCOUNTER — Ambulatory Visit (INDEPENDENT_AMBULATORY_CARE_PROVIDER_SITE_OTHER): Payer: PPO | Admitting: *Deleted

## 2019-09-22 DIAGNOSIS — I480 Paroxysmal atrial fibrillation: Secondary | ICD-10-CM

## 2019-09-23 ENCOUNTER — Other Ambulatory Visit: Payer: Self-pay | Admitting: Internal Medicine

## 2019-09-25 NOTE — Telephone Encounter (Signed)
Pt last saw Clint Fenton, PA on 09/08/19, last labs 08/15/19 Creat 1.05, age 73, weight 92.6kg, CrCl 84.52, based on CrCl pt is on appropriate dosage of Xarelto 20mg  QD.  Will refill rx.

## 2019-09-26 LAB — CUP PACEART REMOTE DEVICE CHECK
Battery Remaining Longevity: 60 mo
Battery Remaining Percentage: 62 %
Brady Statistic RA Percent Paced: 36 %
Brady Statistic RV Percent Paced: 0 %
Date Time Interrogation Session: 20201025203500
Implantable Lead Implant Date: 20130816
Implantable Lead Implant Date: 20130816
Implantable Lead Location: 753859
Implantable Lead Location: 753860
Implantable Lead Model: 5076
Implantable Lead Model: 5076
Implantable Pulse Generator Implant Date: 20130816
Lead Channel Impedance Value: 470 Ohm
Lead Channel Impedance Value: 544 Ohm
Lead Channel Pacing Threshold Amplitude: 0.9 V
Lead Channel Pacing Threshold Pulse Width: 0.4 ms
Lead Channel Setting Pacing Amplitude: 2 V
Lead Channel Setting Sensing Sensitivity: 2.5 mV
Pulse Gen Serial Number: 111255

## 2019-09-27 ENCOUNTER — Telehealth (INDEPENDENT_AMBULATORY_CARE_PROVIDER_SITE_OTHER): Payer: PPO | Admitting: Internal Medicine

## 2019-09-27 ENCOUNTER — Encounter: Payer: Self-pay | Admitting: Internal Medicine

## 2019-09-27 ENCOUNTER — Other Ambulatory Visit: Payer: Self-pay | Admitting: Internal Medicine

## 2019-09-27 VITALS — Ht 71.0 in | Wt 193.0 lb

## 2019-09-27 DIAGNOSIS — I4819 Other persistent atrial fibrillation: Secondary | ICD-10-CM

## 2019-09-27 DIAGNOSIS — I495 Sick sinus syndrome: Secondary | ICD-10-CM | POA: Diagnosis not present

## 2019-09-27 DIAGNOSIS — I10 Essential (primary) hypertension: Secondary | ICD-10-CM

## 2019-09-27 MED ORDER — ATORVASTATIN CALCIUM 20 MG PO TABS: 20.0000 mg | ORAL_TABLET | Freq: Every evening | ORAL | 3 refills | Status: DC

## 2019-09-27 NOTE — Progress Notes (Signed)
Electrophysiology TeleHealth Note  Due to national recommendations of social distancing due to [place] 19, an audio telehealth visit is felt to be most appropriate for this patient at this time.  Verbal consent was obtained by me for the telehealth visit today.  The patient does not have capability for a virtual visit.  A phone visit is therefore required today.   Date:  09/27/2019   ID:  Russell Griffith, DOB Apr 02, 1946, MRN [id number]  Location: patient's home  Provider location:  [place]  Evaluation Performed: Follow-up visit  PCP:  [name], Evie Lacks, MD   Electrophysiologist:  Dr Russell Griffith  Chief Complaint:  palpitations  History of Present Illness:    Russell Griffith is a 73 y.o. male who presents via telehealth conferencing today.  Since last being seen in our clinic, the patient reports doing very well.  He was cardioverted in September.  He feels that he has been in sinus since that time.  He was in atrial flutter by remote 09/24/2019 but unaware.  His heart rate is regular and his exercise tolerance preserved.   Today, he denies symptoms of palpitations, chest Griffith, shortness of breath,  lower extremity edema, dizziness, presyncope, or syncope.  The patient is otherwise without complaint today.   Past Medical History:  Diagnosis Date  . Aortic stenosis    Mild, echo, April, 2014  . BPH (benign prostatic hyperplasia)   . CAD (coronary artery disease)    a. s/p CABG  . Carotid artery disease (Russell Griffith)   . Colonic polyp   . Diverticulosis   . Elevated bilirubin    Mild chronic elevation, 2.0 January, 2011 stable  . GERD (gastroesophageal reflux disease)    Barrett's esophagus  . HTN (hypertension)   . Hyperlipidemia    Low HDL  . 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 Persistent atrial fibrillation ([place])    a. s/p PVI at [facility]  . Primary osteoarthritis of left knee    Mild  . Prolapsed internal hemorrhoids, grade 3 08/12/2015  .  PVC's (premature ventricular contractions)   . Tubular adenoma of colon     Past Surgical History:  Procedure Laterality Date  . ABLATION     PVI at [facility]  . ATRIAL FIBRILLATION ABLATION N/A 01/25/2019   Procedure: ATRIAL FIBRILLATION ABLATION;  Surgeon: Russell Grayer, MD;  Location: [place] [facility];  Service: Cardiovascular;  Laterality: N/A;  . CARDIOVERSION N/A 10/14/2018   Procedure: CARDIOVERSION;  Surgeon: Russell Records, MD;  Location: [place];  Service: Cardiovascular;  Laterality: N/A;  . CARDIOVERSION N/A 08/18/2019   Procedure: CARDIOVERSION;  Surgeon: Russell Pain, MD;  Location: [facility] ENDOSCOPY;  Service: Cardiovascular;  Laterality: N/A;  . COLONOSCOPY    . CORONARY ARTERY BYPASS GRAFT  2000   CABG X5  . ESOPHAGOGASTRODUODENOSCOPY    . HEMORRHOID BANDING    . INTRAVASCULAR PRESSURE WIRE/FFR STUDY N/A 01/05/2018   Procedure: INTRAVASCULAR PRESSURE WIRE/FFR STUDY;  Surgeon: Sherren Mocha, MD;  Location: [place] [facility];  Service: Cardiovascular;  Laterality: N/A;  . LEFT HEART CATH AND CORS/GRAFTS ANGIOGRAPHY N/A 01/05/2018   Procedure: LEFT HEART CATH AND CORS/GRAFTS ANGIOGRAPHY;  Surgeon: Sherren Mocha, MD;  Location: [place] [facility];  Service: Cardiovascular;  Laterality: N/A;  . PERMANENT PACEMAKER INSERTION N/A 07/15/2012   Procedure: PERMANENT PACEMAKER INSERTION;  Surgeon: Deboraha Sprang, MD;  Location: [facility] CATH LAB;  Service: Cardiovascular;  Laterality: N/A;  .  rotator cuff surgery Right 2017  . TONSILLECTOMY  1956    Current Outpatient Medications  Medication Sig Dispense Refill  . acetaminophen (TYLENOL) 500 MG tablet Take 1,000 mg by mouth every 6 (six) hours as needed for moderate Griffith or headache.    [name] 20 MG tablet Take 1 tablet (20 mg total) by mouth every evening. 90 tablet 3  . Cholecalciferol (D3-1000) 25 MCG (1000 UT) capsule Take 1,000 Units by mouth daily with supper.     . NONFORMULARY OR COMPOUNDED ITEM Apply 1  application topically daily as needed (rash). Cetaphil + triamcinolone cream    . pantoprazole (PROTONIX) 40 MG tablet Take 40 mg by mouth daily.    . polyvinyl alcohol (LIQUIFILM TEARS) 1.4 % ophthalmic solution Place 1 drop into both eyes every 8 (eight) hours as needed for dry eyes.    . ramipril (ALTACE) 2.5 MG capsule Take 2.5 mg by mouth every evening.     [name] 20 MG TABS tablet TAKE 1 TABLET IN THE EVENING WITH SUPPER. 30 tablet 5   No current facility-administered medications for this visit.     Allergies:   Cayenne, Niacin and related, and Sulfonamide derivatives   Social History:  The patient  reports that he has never smoked. He has never used smokeless tobacco. He reports current alcohol use of about 21.0 standard drinks of alcohol per week. He reports that he does not use drugs.   Family History:  The patient's family history includes Coronary artery disease (age of onset: 38) in an other family member; Diabetes in his father and another family member; Hyperlipidemia in an other family member; Hypertension in an other family member; Multiple myeloma in his mother; Renal Disease in his father.   ROS:  Please see the history of present illness.   All other systems are personally reviewed and negative.    Exam:    Vital Signs:  Ht '5\' 11"'$  (1.803 m)   Wt 193 lb (87.5 kg)   BMI 26.92 kg/m   Well sounding, alert and conversant   Labs/Other Tests and Data Reviewed:    Recent Labs: 08/15/2019: BUN 20; Creatinine, Ser 1.05; Hemoglobin 14.4; Platelets 163; Potassium 4.2; Sodium 141 08/23/2019: TSH 5.62   Wt Readings from Last 3 Encounters:  09/27/19 193 lb (87.5 kg)  09/08/19 204 lb 3.2 oz (92.6 kg)  09/01/19 200 lb 6.4 oz (90.9 kg)     Last device remote is reviewed from [facility] PDF which reveals normal device function, atrial flutter is the presenting rhythm,  Histograms are stable  ASSESSMENT & PLAN:    1.  Afib/ atypical atrial flutter Unfortunately, he has  returned to atypical atrial flutter He did not tolerate multaq due to GI symptoms. He is unaware of his arrhythmia currently. We discussed options of rate control, tikosyn, amiodarone, or 3rd ablation. Given severe RA enlargement, I am not in favor of further ablation currently. Given paucity of symptoms, preserved EF, and good rate control, he and I together have decided to pursue rate control currently. He will follow-up with Dr Caryl Comes in 4-6 weeks for reassessment.  The patient would likely reconsider tikosyn (which was unsuccessful years ago) if symptoms return. Continue xarelto  2. Sick sinus syndrome Normal pacemaker function by recent remote  3. CAD No ischemic symptoms  4. HTN Stable No change required today   Follow-up:  Dr Caryl Comes in 4-6 weeks for further discusions   Patient Risk:  after full review of this  patients clinical status, I feel that they are at moderate risk at this time.  Today, I have spent 15 minutes with the patient with telehealth technology discussing arrhythmia management .    SignedThompson Grayer, MD  09/27/2019 3:14 PM     [address] [place] [id number] [phone] (office) [phone] (fax)

## 2019-09-29 NOTE — Progress Notes (Signed)
Remote ICD transmission.   

## 2019-10-02 ENCOUNTER — Ambulatory Visit: Payer: PPO | Admitting: Gastroenterology

## 2019-10-02 ENCOUNTER — Encounter: Payer: Self-pay | Admitting: Gastroenterology

## 2019-10-02 VITALS — BP 134/70 | HR 68 | Temp 98.4°F | Ht 69.5 in | Wt 202.4 lb

## 2019-10-02 DIAGNOSIS — D126 Benign neoplasm of colon, unspecified: Secondary | ICD-10-CM | POA: Diagnosis not present

## 2019-10-02 DIAGNOSIS — K2270 Barrett's esophagus without dysplasia: Secondary | ICD-10-CM | POA: Diagnosis not present

## 2019-10-02 NOTE — Progress Notes (Signed)
Review of pertinent gastrointestinal problems: 1. History of Barrett's esophagus.No dysplasia seen on serial endoscopies 2004, 2005, 2007. Most recent EGD 2009 found short segment of Barrett's appearing mucosa, biopsies did not confirm intestinal metaplasia however. EGD may 2011 also found short segment, non-nodular Barrett's mucosa however biopsies again showed no intestinal metaplasia. EGD Dr. Ardis Hughs 10/2012 slightly irregular z line, non-nodular, biopsies showed no IM, no further surveillance was recommended. 2. Personal history of precancerous colon polyps. Adenomatous polyp removed 2003 by Dr. Velora Heckler. Followup colonoscopy 2005 found no polyps. Dr. Inocente Salles recommended that he have a repeat colonoscopy at 7 year interval. Colonoscopy may 2011 found left-sided diverticulosis, small colon polyp that was an adenoma. Colonoscopy 05/2015 Dr. Ardis Hughs found single subCM adenoma, internal hems, diverticulosis. Recommended to have surveillance colonoscopy at 5 year interval.   3. Internal hems. Banded by Dr. Carlean Purl [phone].   HPI: This is a very pleasant 73 year old man   I saw him about 2 years ago for chronic GERD without alarm symptoms.  We tried cutting back his proton pump inhibitor to 20 mg strength to see if it would be just as effective to control his symptoms however it was not.  Labs September 2020 show normal CBC, normal basic metabolic profile  His weight is up 12 pounds since his last visit here about 2 years ago  He tried few months ago to cut back to every other day proton pump inhibitor but that did not work.  He was bothered by bloating gas and some epigastric tightness intermittently a month or 2 ago.  He felt this was probably food related.  He actually cut out eating nuts in his diet and since he made that change he has felt much better.  No dysphagia  He does see bright red blood when he strains once a month or so to move his bowels.   ROS: complete GI ROS as described in  HPI, all other review negative.  Constitutional:  No unintentional weight loss   Past Medical History:  Diagnosis Date  . Aortic stenosis    Mild, echo, April, 2014  . BPH (benign prostatic hyperplasia)   . CAD (coronary artery disease)    a. s/p CABG  . Carotid artery disease ([place])   . Colonic polyp   . Diverticulosis   . Elevated bilirubin    Mild chronic elevation, 2.0 January, 2011 stable  . GERD (gastroesophageal reflux disease)    Barrett's esophagus  . HTN (hypertension)   . Hyperlipidemia    Low HDL  . 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 Persistent atrial fibrillation ([place])    a. s/p PVI at [facility]  . Primary osteoarthritis of left knee    Mild  . Prolapsed internal hemorrhoids, grade 3 08/12/2015  . PVC's (premature ventricular contractions)   . Tubular adenoma of colon     Past Surgical History:  Procedure Laterality Date  . ABLATION     PVI at [facility]  . ATRIAL FIBRILLATION ABLATION N/A 01/25/2019   Procedure: ATRIAL FIBRILLATION ABLATION;  Surgeon: [name], MD;  Location: [place] [facility];  Service: Cardiovascular;  Laterality: N/A;  . CARDIOVERSION N/A 10/14/2018   Procedure: CARDIOVERSION;  Surgeon: Fay Records, MD;  Location: [place];  Service: Cardiovascular;  Laterality: N/A;  . CARDIOVERSION N/A 08/18/2019   Procedure: CARDIOVERSION;  Surgeon: Jerline Pain, MD;  Location: [facility] ENDOSCOPY;  Service: Cardiovascular;  Laterality: N/A;  . COLONOSCOPY    .  CORONARY ARTERY BYPASS GRAFT  2000   CABG X5  . ESOPHAGOGASTRODUODENOSCOPY    . HEMORRHOID BANDING    . INTRAVASCULAR PRESSURE WIRE/FFR STUDY N/A 01/05/2018   Procedure: INTRAVASCULAR PRESSURE WIRE/FFR STUDY;  Surgeon: Sherren Mocha, MD;  Location: [place] [facility];  Service: Cardiovascular;  Laterality: N/A;  . LEFT HEART CATH AND CORS/GRAFTS ANGIOGRAPHY N/A 01/05/2018   Procedure: LEFT HEART CATH AND CORS/GRAFTS ANGIOGRAPHY;  Surgeon: Sherren Mocha, MD;  Location: [place] [facility];  Service: Cardiovascular;  Laterality: N/A;  . PERMANENT PACEMAKER INSERTION N/A 07/15/2012   Procedure: PERMANENT PACEMAKER INSERTION;  Surgeon: Deboraha Sprang, MD;  Location: [facility] CATH LAB;  Service: Cardiovascular;  Laterality: N/A;  . rotator cuff surgery Right 2017  . TONSILLECTOMY  1956    Current Outpatient Medications  Medication Sig Dispense Refill  . acetaminophen (TYLENOL) 500 MG tablet Take 1,000 mg by mouth every 6 (six) hours as needed for moderate pain or headache.    Marland Duva atorvastatin (LIPITOR) 20 MG tablet Take 1 tablet (20 mg total) by mouth every evening. 90 tablet 3  . Cholecalciferol (D3-1000) 25 MCG (1000 UT) capsule Take 1,000 Units by mouth daily with supper.     . NONFORMULARY OR COMPOUNDED ITEM Apply 1 application topically daily as needed (rash). Cetaphil + triamcinolone cream    . pantoprazole (PROTONIX) 40 MG tablet Take 40 mg by mouth daily.    . polyvinyl alcohol (LIQUIFILM TEARS) 1.4 % ophthalmic solution Place 1 drop into both eyes every 8 (eight) hours as needed for dry eyes.    . ramipril (ALTACE) 2.5 MG capsule Take 2.5 mg by mouth every evening.     [name] 20 MG TABS tablet TAKE 1 TABLET IN THE EVENING WITH SUPPER. 30 tablet 5   No current facility-administered medications for this visit.     Allergies as of 10/02/2019 - Review Complete 10/02/2019  Allergen Reaction Noted  . Cayenne Other (See Comments) 10/29/2015  . Niacin and related Other (See Comments) 08/06/2016  . Sulfonamide derivatives Other (See Comments)     Family History  Problem Relation Age of Onset  . Coronary artery disease Other 70  . Diabetes Other   . Multiple myeloma Mother   . Diabetes Father   . Renal Disease Father   . Hyperlipidemia Other   . Hypertension Other   . Colon cancer Neg Hx   . Esophageal cancer Neg Hx   . Stomach cancer Neg Hx   . Rectal cancer Neg Hx     Social History   Socioeconomic History  . Marital status: Married    Spouse  name: Not on file  . Number of children: 2  . Years of education: 63  . Highest education level: Not on file  Occupational History  . Occupation: Technical brewer: Engineer, mining    Comment: [employer]  Social Needs  . Financial resource strain: Not on file  . Food insecurity    Worry: Not on file    Inability: Not on file  . Transportation needs    Medical: Not on file    Non-medical: Not on file  Tobacco Use  . Smoking status: Never Smoker  . Smokeless tobacco: Never Used  Substance and Sexual Activity  . Alcohol use: Yes    Alcohol/week: 21.0 standard drinks    Types: 21 Glasses of wine per week  . Drug use: No  . Sexual activity: Yes    Partners: Female  Lifestyle  . Physical activity    Days per week: Not on file    Minutes per session: Not on file  . Stress: Not on file  Relationships  . Social Herbalist on phone: Not on file    Gets together: Not on file    Attends religious service: Not on file    Active member of club or organization: Not on file    Attends meetings of clubs or organizations: Not on file    Relationship status: Not on file  . Intimate partner violence    Fear of current or ex partner: Not on file    Emotionally abused: Not on file    Physically abused: Not on file    Forced sexual activity: Not on file  Other Topics Concern  . Not on file  Social History Narrative   [employer] [place], [place].  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  2 daughters-'75, '79; 1 grandchild; step-daughter and step grandson.  Regular exercise-yes, runs 1.5-2 mi 4x/wk, also eliptical      Patient signed a Designated Party Release to allow his wife, Bradey Luzier, to have access to his medical records/ information.     Physical Exam: Temp 98.4 F (36.9 C)   Ht 5' 9.5" (1.765 m) Comment: height measured without shoes  Wt 202 lb 6 oz (91.8 kg)    BMI 29.46 kg/m  Constitutional: generally well-appearing Psychiatric: alert and oriented x3 Abdomen: soft, nontender, nondistended, no obvious ascites, no peritoneal signs, normal bowel sounds No peripheral edema noted in lower extremities  Assessment and plan: 73 y.o. male with history of adenomatous colon polyps, chronic GERD  He would be due for repeat colonoscopy for polyp surveillance in 2 or 3 months.  He had Barrett's proven at his GE junction many years ago however surveillance upper endoscopy since then have never shown the intestinal metaplasia.  He was wondering if it be reasonable to look in his esophagus again since it has been 7 years since his last upper endoscopy to see if he has any significant chronic damage from GERD, recurrent Barrett's.  I think that sounds reasonable enough especially since it would be at the same time as his colonoscopy.  Please see the "Patient Instructions" section for addition details about the plan.  Owens Loffler, MD [facility] 10/02/2019, 3:22 PM

## 2019-10-02 NOTE — Patient Instructions (Addendum)
If you are age 71 or older, your body mass index should be between 23-30. Your Body mass index is 29.46 kg/m. If this is out of the aforementioned range listed, please consider follow up with your Primary Care Provider.  If you are age 70 or younger, your body mass index should be between 19-25. Your Body mass index is 29.46 kg/m. If this is out of the aformentioned range listed, please consider follow up with your Primary Care Provider.    It has been recommended to you by your physician that you have a(n) Endo/Colon completed in January 2021.We do not have our schedule out for January 2021. Please contact our office at [phone] The middle of December 2020 to schedule these procedures. At this time we will get in contact with Dr Alain Marion to discuss stopping your Xarelto for several days.   Thank you for choosing me and [facility]  Owens Loffler, MD

## 2019-10-05 NOTE — Progress Notes (Addendum)
Subjective:   Russell Griffith is a 73 y.o. male who presents for Medicare Annual/Subsequent preventive examination. I connected with patient by a telephone and verified that I am speaking with the correct person using two identifiers. Patient stated full name and DOB. Patient gave permission to continue with telephonic visit. Patient's location was at home and Nurse's location was at [place] office. Participants during this visit included patient and nurse.  Review of Systems:   Cardiac Risk Factors include: advanced age (>74mn, >>64women);hypertension;male gender;dyslipidemia Sleep patterns: feels rested on waking, gets up 1 times nightly to void and sleeps 7 hours nightly.    Home Safety/Smoke Alarms: Feels safe in home. Smoke alarms in place.  Living environment; residence and Firearm Safety[place], no needs for DME, good support system Seat Belt Safety/Bike Helmet: Wears seat belt.     Objective:    Vitals: There were no vitals taken for this visit.  There is no height or weight on file to calculate BMI.  Advanced Directives 10/06/2019 08/18/2019 01/25/2019 10/14/2018 09/23/2018 01/05/2018 06/29/2017  Does Patient Have a Medical Advance Directive? Yes Yes Yes Yes Yes Yes Yes  Type of AParamedicof AWakaLiving will [place] will H[place] WashingtonLiving will Healthcare Power of [place]Living will [place]ays[place]Living will Living will;Healthcare Power of Attorney  Does patient want to make changes to medical advance directive? - - No - Patient declined - - No - Patient declined -  Copy of HF[place]in Chart? No - copy requested No - copy requested No - copy requested No - copy requested No - copy requested Yes No - copy requested  Would patient like information on creating a medical advance directive? - - - - - - -    Tobacco Social  History   Tobacco Use  Smoking Status Never Smoker  Smokeless Tobacco Never Used     Counseling given: Not Answered  Past Medical History:  Diagnosis Date  . Aortic stenosis    Mild, echo, April, 2014  . BPH (benign prostatic hyperplasia)   . CAD (coronary artery disease)    a. s/p CABG  . Carotid artery disease ([place]   . Colonic polyp   . Diverticulosis   . Elevated bilirubin    Mild chronic elevation, 2.0 January, 2011 stable  . GERD (gastroesophageal reflux disease)    Barrett's esophagus  . HTN (hypertension)   . Hyperlipidemia    Low HDL  . 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[place]    Left  lung chest x-ray July, 2013  . Persistent atrial fibrillation ([place]    [facility] . Primary osteoarthritis of left knee    Mild  . Prolapsed internal hemorrhoids, grade 3 08/12/2015  . PVC's (premature ventricular contractions)   . Tubular adenoma of colon    Past Surgical History:  Procedure Laterality Date  . ABLATION     [facility] . ATRIAL FIBRILLATION ABLATION N/A 01/25/2019   Procedure: ATRIAL FIBRILLATION ABLATION;  Surgeon: AThompson Grayer MD;  [place];  Service: Cardiovascular;  Laterality: N/A;  . CARDIOVERSION N/A 10/14/2018   Procedure: CARDIOVERSION;  Surgeon: RFay Records MD;  Location: [place]  Service: Cardiovascular;  Laterality: N/A;  . CARDIOVERSION N/A 08/18/2019   Procedure: CARDIOVERSION;  Surgeon: SJerline Pain MD;  [facility];  Service: Cardiovascular;  Laterality: N/A;  . COLONOSCOPY    .  CORONARY ARTERY BYPASS GRAFT  2000   CABG X5  . ESOPHAGOGASTRODUODENOSCOPY    . HEMORRHOID BANDING    . INTRAVASCULAR PRESSURE WIRE/FFR STUDY N/A 01/05/2018   Procedure: INTRAVASCULAR PRESSURE WIRE/FFR STUDY;  Surgeon: Sherren Mocha, MD;  Location: [place] CV LAB;  Service: Cardiovascular;  Laterality: N/A;  . LEFT HEART CATH AND CORS/GRAFTS ANGIOGRAPHY N/A 01/05/2018   Procedure: LEFT HEART CATH AND CORS/GRAFTS ANGIOGRAPHY;   Surgeon: Sherren Mocha, MD;  Location: [place] CV LAB;  Service: Cardiovascular;  Laterality: N/A;  . PERMANENT PACEMAKER INSERTION N/A 07/15/2012   Procedure: PERMANENT PACEMAKER INSERTION;  Surgeon: Deboraha Sprang, MD;  Location: [facility] CATH LAB;  Service: Cardiovascular;  Laterality: N/A;  . rotator cuff surgery Right 2017  . TONSILLECTOMY  1956   Family History  Problem Relation Age of Onset  . Coronary artery disease Other 70  . Diabetes Other   . Multiple myeloma Mother   . Diabetes Father   . Renal Disease Father   . Hyperlipidemia Other   . Hypertension Other   . Colon cancer Neg Hx   . Esophageal cancer Neg Hx   . Stomach cancer Neg Hx   . Rectal cancer Neg Hx    Social History   Socioeconomic History  . Marital status: Married    Spouse name: Not on file  . Number of children: 2  . Years of education: 20  . Highest education level: Not on file  Occupational History  . Occupation: Technical brewer: Engineer, mining    Comment: [employer]  Social Needs  . Financial resource strain: Not hard at all  . Food insecurity    Worry: Never true    Inability: Never true  . Transportation needs    Medical: No    Non-medical: No  Tobacco Use  . Smoking status: Never Smoker  . Smokeless tobacco: Never Used  Substance and Sexual Activity  . Alcohol use: Yes    Alcohol/week: 21.0 standard drinks    Types: 21 Glasses of wine per week  . Drug use: No  . Sexual activity: Yes    Partners: Female  Lifestyle  . Physical activity    Days per week: 5 days    Minutes per session: 30 min  . Stress: Not at all  Relationships  . Social connections    Talks on phone: More than three times a week    Gets together: More than three times a week    Attends religious service: Not on file    Active member of club or organization: Yes    Attends meetings of clubs or organizations: More than 4 times per year    Relationship status: Married  Other  Topics Concern  . Not on file  Social History Narrative   [place], [place].  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  2 daughters-'75, '79; 1 grandchild; step-daughter and step grandson.  Regular exercise-yes, runs 1.5-2 mi 4x/wk, also eliptical      Patient signed a Designated Party Release to allow his wife, Russell Griffith, to have access to his medical records/ information.    Outpatient Encounter Medications as of 10/06/2019  Medication Sig  . acetaminophen (TYLENOL) 500 MG tablet Take 1,000 mg by mouth every 6 (six) hours as needed for moderate pain or headache.  Marland Yassin atorvastatin (LIPITOR) 20 MG tablet Take 1 tablet (20 mg total) by mouth every evening.  Marland Brierley  Cholecalciferol (D3-1000) 25 MCG (1000 UT) capsule Take 1,000 Units by mouth daily with supper.   . NONFORMULARY OR COMPOUNDED ITEM Apply 1 application topically daily as needed (rash). Cetaphil + triamcinolone cream  . pantoprazole (PROTONIX) 40 MG tablet Take 40 mg by mouth daily.  . polyvinyl alcohol (LIQUIFILM TEARS) 1.4 % ophthalmic solution Place 1 drop into both eyes every 8 (eight) hours as needed for dry eyes.  . ramipril (ALTACE) 2.5 MG capsule Take 2.5 mg by mouth every evening.   [name] 20 MG TABS tablet TAKE 1 TABLET IN THE EVENING WITH SUPPER.   No facility-administered encounter medications on file as of 10/06/2019.     Activities of Daily Living In your present state of health, do you have any difficulty performing the following activities: 10/06/2019  Hearing? N  Vision? N  Difficulty concentrating or making decisions? N  Walking or climbing stairs? N  Dressing or bathing? N  Doing errands, shopping? N  Preparing Food and eating ? N  Using the Toilet? N  In the past six months, have you accidently leaked urine? N  Do you have problems with loss of bowel control? N  Managing your Medications? N  Managing your Finances? N   Housekeeping or managing your Housekeeping? N  Some recent data might be hidden    Patient Care Team: [name], Evie Lacks, MD as PCP - General (Internal Medicine) Deboraha Sprang, MD as PCP - Electrophysiology (Cardiology) Stefanie Libel, MD (Family Medicine) Deboraha Sprang, MD (Cardiology) Jarome Matin, MD as Consulting Physician (Dermatology) Milus Banister, MD as Attending Physician (Gastroenterology) Minus Breeding, MD as Consulting Physician (Cardiology)   Assessment:   This is a routine wellness examination for Russell Griffith. Physical assessment deferred to PCP.  Exercise Activities and Dietary recommendations Current Exercise Habits: Home exercise routine, Type of exercise: treadmill(elliptical), Time (Minutes): 30, Frequency (Times/Week): 5, Weekly Exercise (Minutes/Week): 150, Intensity: Mild, Exercise limited by: None identified Diet (meal preparation, eat out, water intake, caffeinated beverages, dairy products, fruits and vegetables): in general, a "healthy" diet  , well balanced   Encouraged patient to increase daily water and healthy fluid intake.  Goals    . lose weight to get to 180     Continue to decrease carbohydrates at most meals, exercise more by increasing  Exercise to 5 times per week.        Fall Risk Fall Risk  10/06/2019 08/18/2018 06/29/2017 08/06/2016 03/06/2015  Falls in the past year? 0 No No No No  Number falls in past yr: 0 - - - -  Injury with Fall? 0 - - - -  Risk for fall due to : - - - - Other (Comment)    Depression Screen PHQ 2/9 Scores 10/06/2019 08/23/2019 08/18/2018 06/29/2017  PHQ - 2 Score 0 0 0 0  Exception Documentation - - - -    Cognitive Function       Ad8 score reviewed for issues:  Issues making decisions: no  Less interest in hobbies / activities: no  Repeats questions, stories (family complaining): no  Trouble using ordinary gadgets (microwave, computer, phone):no  Forgets the month or year: no  Mismanaging finances: no   Remembering appts: no  Daily problems with thinking and/or memory: no Ad8 score is= 0  Immunization History  Administered Date(s) Administered  . Fluad Quad(high Dose 65+) 08/23/2019  . Influenza Whole 08/31/2009  . Influenza, High Dose Seasonal PF 08/12/2017  . Influenza,inj,Quad PF,6+ Mos 08/06/2016  . Influenza-Unspecified  08/24/2014, 08/30/2015  . Pneumococcal Conjugate-13 09/13/2013  . Pneumococcal Polysaccharide-23 03/28/2009, 07/23/2015  . Td 03/28/2009  . Tdap 08/23/2019  . Zoster 09/13/2013  . Zoster Recombinat (Shingrix) 03/08/2018, 06/17/2018   Screening Tests Health Maintenance  Topic Date Due  . COLONOSCOPY  05/07/2020  . TETANUS/TDAP  08/22/2029  . INFLUENZA VACCINE  Completed  . Hepatitis C Screening  Completed  . PNA vac Low Risk Adult  Completed        Plan:    Reviewed health maintenance screenings with patient today and relevant education, vaccines, and/or referrals were provided.   Continue to eat heart healthy diet (full of fruits, vegetables, whole grains, lean protein, water--limit salt, fat, and sugar intake) and increase physical activity as tolerated.  Continue doing brain stimulating activities (puzzles, reading, adult coloring books, staying active) to keep memory sharp.   I have personally reviewed and noted the following in the patient's chart:   . Medical and social history . Use of alcohol, tobacco or illicit drugs  . Current medications and supplements . Functional ability and status . Nutritional status . Physical activity . Advanced directives . List of other physicians . Vitals . Screenings to include cognitive, depression, and falls . Referrals and appointments  In addition, I have reviewed and discussed with patient certain preventive protocols, quality metrics, and best practice recommendations. A written personalized care plan for preventive services as well as general preventive health recommendations were provided to  patient.     Michiel Cowboy, RN  10/06/2019  Medical screening examination/treatment/procedure(s) were performed by non-physician practitioner and as supervising physician I was immediately available for consultation/collaboration. I agree with above. Binnie Rail, MD

## 2019-10-06 ENCOUNTER — Other Ambulatory Visit: Payer: Self-pay

## 2019-10-06 ENCOUNTER — Ambulatory Visit (INDEPENDENT_AMBULATORY_CARE_PROVIDER_SITE_OTHER): Payer: PPO | Admitting: *Deleted

## 2019-10-06 DIAGNOSIS — Z0000 Encounter for general adult medical examination without abnormal findings: Secondary | ICD-10-CM

## 2019-10-08 ENCOUNTER — Other Ambulatory Visit: Payer: Self-pay | Admitting: Cardiology

## 2019-10-09 ENCOUNTER — Ambulatory Visit: Payer: PPO | Admitting: Internal Medicine

## 2019-11-09 ENCOUNTER — Encounter: Payer: Self-pay | Admitting: Internal Medicine

## 2019-11-09 ENCOUNTER — Other Ambulatory Visit: Payer: Self-pay

## 2019-11-09 ENCOUNTER — Ambulatory Visit: Payer: PPO | Admitting: Internal Medicine

## 2019-11-09 VITALS — BP 126/72 | HR 70 | Ht 69.5 in | Wt 204.6 lb

## 2019-11-09 DIAGNOSIS — Z950 Presence of cardiac pacemaker: Secondary | ICD-10-CM

## 2019-11-09 DIAGNOSIS — I4819 Other persistent atrial fibrillation: Secondary | ICD-10-CM | POA: Diagnosis not present

## 2019-11-09 NOTE — Progress Notes (Signed)
Patient Care Team: [name], Evie Lacks, MD as PCP - General (Internal Medicine) Deboraha Sprang, MD as PCP - Electrophysiology (Cardiology) Stefanie Libel, MD (Family Medicine) Deboraha Sprang, MD (Cardiology) Jarome Matin, MD as Consulting Physician (Dermatology) Milus Banister, MD as Attending Physician (Gastroenterology) Minus Breeding, MD as Consulting Physician (Cardiology)   HPI  Russell Griffith is a 73 y.o. male Seen in followup for atrial fibrillation for which pulmonary he underwent pulmonary vein isolation at [facility]. He is also status post pacemaker implantation for significant pausing in the context of intermittent atrial fibrillation and sinus node dysfunction   History of coronary artery disease with bypass surgery 2000 and a Myoview November 2013 demonstrating no ischemia or scar. LV function was normal by echo February 2014  DATE TEST EF   2/19 LHC  55-60 % LIMA-LAD/SVG-D, SVG-PDA Patent SVG AM-OM2  12/19 Echo  60-65% LAE 41/2.0/33     Because of chronotropic incompetence, we made more aggressive his rate response.  This was associated with significant improvement.  Had a history of exercise-induced polymorphic ventricular tachycardia, prompting  catheterization which demonstrated occlusion of 1 of his vein grafts but other conduits were open   At his last visit, his device was reprogrammed AAIR to prevent inappropriate ventricular pacing as we were seen R on T.  Since then he has done really quite well.  He has had scant palpitations.  With more symptoms atrial fibrillation he saw Dr. Greggory Brandy as opposed to [facility].  He underwent repeat ablation   Continues with significant AFib, assoc with fatigue and impaired exercise tolerance some palpitations.  I had on his behalf reached out to [facility] regarding repeat ablation.  He comes in today did consider repeat ablation and alternative strategies as well as consideration of having his ablation done here; there has been return of  conduction on both left-sided veins and in the right carina.  He underwent repeat ablation and isolation of all 4 veins.  Notably there was no electrical activity on the roof.  For the left atrial ablation included a lesion along the posterior wall to form a standard box  A telehealth visit from 9/20 with Dr. Greggory Brandy is reviewed.  Recurrence of atrial fibrillation was noted.  Also severe right atrial enlargement was reported.  He underwent cardioversion; however, he reverted to atrial flutter sometime thereafter.  Dr. Nikki Dom note also describes discussion of antiarr hythmic options including dronaderone, dofetilide and amiodarone if atrial arrhythmias were to recur.  Dronaderone was started at the A. fib clinic but intolerant because of nausea  Repeat telehealth visit 09/27/2019 by Dr. Greggory Brandy.  The patient was in atrial flutter but was unaware.  Heart rate was regular and exercise tolerance preserved.  Given his severe right atrial enlargement, he was not in favor of further ablation therapy.  Antiarrhythmic options were left open if symptoms were to recur.   He remains fit and without complaints of exercise tolerance   HR limited to about 120's   Atrial Fibrillation Management history:  Previous antiarrhythmic drugs: Tikosyn, Multaq Previous cardioversions: 10/14/18, 08/18/19 Previous ablations: remote at [facility], 01/25/19 CHADS2VASC score: 3 (age, HTN, CAD) Anticoagulation history: Xarelto   Past Medical History:  Diagnosis Date  . Aortic stenosis    Mild, echo, April, 2014  . BPH (benign prostatic hyperplasia)   . CAD (coronary artery disease)    a. s/p CABG  . Carotid artery disease ([place])   . Colonic polyp   . Diverticulosis   .  Elevated bilirubin    Mild chronic elevation, 2.0 January, 2011 stable  . GERD (gastroesophageal reflux disease)    Barrett's esophagus  . HTN (hypertension)   . Hyperlipidemia    Low HDL  . 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 Persistent  atrial fibrillation ([place])    a. s/p PVI at [facility]  . Primary osteoarthritis of left knee    Mild  . Prolapsed internal hemorrhoids, grade 3 08/12/2015  . PVC's (premature ventricular contractions)   . Tubular adenoma of colon     Past Surgical History:  Procedure Laterality Date  . ABLATION     PVI at [facility]  . ATRIAL FIBRILLATION ABLATION N/A 01/25/2019   Procedure: ATRIAL FIBRILLATION ABLATION;  Surgeon: [name], MD;  Location: [place] CV LAB;  Service: Cardiovascular;  Laterality: N/A;  . CARDIOVERSION N/A 10/14/2018   Procedure: CARDIOVERSION;  Surgeon: Fay Records, MD;  Location: [place];  Service: Cardiovascular;  Laterality: N/A;  . CARDIOVERSION N/A 08/18/2019   Procedure: CARDIOVERSION;  Surgeon: Jerline Pain, MD;  [facility]: [facility] ENDOSCOPY;  Service: Cardiovascular;  Laterality: N/A;  . COLONOSCOPY    . CORONARY ARTERY BYPASS GRAFT  2000   CABG X5  . ESOPHAGOGASTRODUODENOSCOPY    . HEMORRHOID BANDING    . INTRAVASCULAR PRESSURE WIRE/FFR STUDY N/A 01/05/2018   Procedure: INTRAVASCULAR PRESSURE WIRE/FFR STUDY;  Surgeon: Sherren Mocha, MD;  Location: [place] CV LAB;  Service: Cardiovascular;  Laterality: N/A;  . LEFT HEART CATH AND CORS/GRAFTS ANGIOGRAPHY N/A 01/05/2018   Procedure: LEFT HEART CATH AND CORS/GRAFTS ANGIOGRAPHY;  Surgeon: Sherren Mocha, MD;  Location: [place] CV LAB;  Service: Cardiovascular;  Laterality: N/A;  . PERMANENT PACEMAKER INSERTION N/A 07/15/2012   Procedure: PERMANENT PACEMAKER INSERTION;  Surgeon: Deboraha Sprang, MD;  Location: [facility] CATH LAB;  Service: Cardiovascular;  Laterality: N/A;  . rotator cuff surgery Right 2017  . TONSILLECTOMY  1956    Current Outpatient Medications  Medication Sig Dispense Refill  . acetaminophen (TYLENOL) 500 MG tablet Take 1,000 mg by mouth every 6 (six) hours as needed for moderate pain or headache.    Marland Clemenson atorvastatin (LIPITOR) 20 MG tablet Take 1 tablet (20 mg total) by mouth every evening. 90 tablet  3  . Cholecalciferol (D3-1000) 25 MCG (1000 UT) capsule Take 1,000 Units by mouth daily with supper.     . NONFORMULARY OR COMPOUNDED ITEM Apply 1 application topically daily as needed (rash). Cetaphil + triamcinolone cream    . pantoprazole (PROTONIX) 40 MG tablet Take 40 mg by mouth daily.    . polyvinyl alcohol (LIQUIFILM TEARS) 1.4 % ophthalmic solution Place 1 drop into both eyes every 8 (eight) hours as needed for dry eyes.    . ramipril (ALTACE) 2.5 MG capsule TAKE (1) CAPSULE DAILY. 30 capsule 1  . XARELTO 20 MG TABS tablet TAKE 1 TABLET IN THE EVENING WITH SUPPER. 30 tablet 5   No current facility-administered medications for this visit.    Allergies  Allergen Reactions  . Cayenne Other (See Comments)    Sweats w/paprika too  . Niacin And Related Other (See Comments)    Upset stomach  . Sulfonamide Derivatives Other (See Comments)    "have no idea; mother told me I was allergic to"    Review of Systems negative except from HPI and PMH  Physical Exam  BP 126/72   Pulse 70   Ht 5' 9.5" (1.765 m)  Wt 204 lb 9.6 oz (92.8 kg)   SpO2 98%   BMI 29.78 kg/m   Well developed and well nourished in no acute distress HENT normal Neck supple with JVP-flat Clear Device pocket well healed; without hematoma or erythema.  There is no tethering  Regular rate and rhythm, no  murmur Abd-soft with active BS No Clubbing cyanosis   edema Skin-warm and dry A & Oriented  Grossly normal sensory and motor function  ECG  Atrial tach/flutter @ 300 msec  3:1 conduction   Assessment and  Plan  Atrial fibrillation s/p ablation persistent with recurrent flutter   PACs  Sinus node dysfunction  Ischemic heart disease with prior bypass surgery  Pacemaker  [employer]  Pacemaker function-abnormal resulting in R on T pacing  Patient has persistent atrial flutter.  He has scant symptoms.  He feels great.  Agree with Dr. Rayann Heman who saw by telehealth a couple months ago, we had a  lengthy discussion regarding choosing a course of rate control at this juncture.  We discussed the implications of not pursuing rhythm control both immediately and long-term, i.e. later efforts to restore sinus rhythm will become increasingly untenable.  Given his symptoms, his atriopathy, he is inclined towards rate control so we will pursue that.  Continue anticoagulation

## 2019-11-09 NOTE — Patient Instructions (Signed)

## 2019-11-14 ENCOUNTER — Encounter: Payer: Self-pay | Admitting: General Surgery

## 2019-12-03 ENCOUNTER — Other Ambulatory Visit: Payer: Self-pay | Admitting: Cardiology

## 2019-12-08 ENCOUNTER — Other Ambulatory Visit: Payer: Self-pay | Admitting: Cardiology

## 2019-12-08 MED ORDER — RAMIPRIL 2.5 MG PO CAPS: 2.5000 mg | ORAL_CAPSULE | Freq: Every day | ORAL | 6 refills | Status: DC

## 2019-12-08 MED ORDER — RAMIPRIL 2.5 MG PO CAPS: 2.5000 mg | ORAL_CAPSULE | Freq: Every day | ORAL | 1 refills | Status: DC

## 2019-12-08 NOTE — Telephone Encounter (Signed)
*  STAT* If patient is at the pharmacy, call can be transferred to refill team.   1. Which medications need to be refilled? (please list name of each medication and dose if known) ramipril 2.5 mg  2. Which pharmacy/location (including street and city if local pharmacy) is medication to be sent to? [facility]  3. Do they need a 30 day or 90 day supply? [place]

## 2019-12-08 NOTE — Addendum Note (Signed)
Addended by: Venetia Maxon on: 12/08/2019 03:44 PM   Modules accepted: Orders

## 2019-12-17 ENCOUNTER — Ambulatory Visit: Payer: PPO | Attending: Internal Medicine

## 2019-12-17 DIAGNOSIS — Z23 Encounter for immunization: Secondary | ICD-10-CM | POA: Insufficient documentation

## 2019-12-17 NOTE — Progress Notes (Signed)
   Covid-19 Vaccination Clinic  Name:  Ryshawn Wiegand    MRN: [id number] DOB: July 02, 1946  12/17/2019  Mr. Morrical was observed post Covid-19 immunization for 15 minutes without incidence. He was provided with Vaccine Information Sheet and instruction to access the V-Safe system.   Mr. Orosco was instructed to call 911 with any severe reactions post vaccine: Marland Degraffenreid Difficulty breathing  . Swelling of your face and throat  . A fast heartbeat  . A bad rash all over your body  . Dizziness and weakness

## 2019-12-22 ENCOUNTER — Ambulatory Visit (INDEPENDENT_AMBULATORY_CARE_PROVIDER_SITE_OTHER): Payer: PPO | Admitting: *Deleted

## 2019-12-22 DIAGNOSIS — I480 Paroxysmal atrial fibrillation: Secondary | ICD-10-CM

## 2019-12-22 LAB — CUP PACEART REMOTE DEVICE CHECK
Battery Remaining Longevity: 54 mo
Battery Remaining Percentage: 56 %
Brady Statistic RA Percent Paced: 0 %
Brady Statistic RV Percent Paced: 0 %
Date Time Interrogation Session: 20210122013200
Implantable Lead Implant Date: 20130816
Implantable Lead Implant Date: 20130816
Implantable Lead Location: 753859
Implantable Lead Location: 753860
Implantable Lead Model: 5076
Implantable Lead Model: 5076
Implantable Pulse Generator Implant Date: 20130816
Lead Channel Impedance Value: 508 Ohm
Lead Channel Impedance Value: 570 Ohm
Lead Channel Pacing Threshold Amplitude: 0.9 V
Lead Channel Pacing Threshold Pulse Width: 0.4 ms
Lead Channel Setting Pacing Amplitude: 2 V
Lead Channel Setting Sensing Sensitivity: 2.5 mV
Pulse Gen Serial Number: 111255

## 2019-12-24 ENCOUNTER — Other Ambulatory Visit: Payer: Self-pay | Admitting: Gastroenterology

## 2020-01-01 DIAGNOSIS — H2513 Age-related nuclear cataract, bilateral: Secondary | ICD-10-CM | POA: Diagnosis not present

## 2020-01-01 DIAGNOSIS — H52203 Unspecified astigmatism, bilateral: Secondary | ICD-10-CM | POA: Diagnosis not present

## 2020-01-01 DIAGNOSIS — H53001 Unspecified amblyopia, right eye: Secondary | ICD-10-CM | POA: Diagnosis not present

## 2020-01-07 ENCOUNTER — Ambulatory Visit: Payer: PPO | Attending: Internal Medicine

## 2020-01-07 DIAGNOSIS — Z23 Encounter for immunization: Secondary | ICD-10-CM | POA: Insufficient documentation

## 2020-01-07 NOTE — Progress Notes (Signed)
   Covid-19 Vaccination Clinic  Name:  Russell Griffith    MRN: [id number] DOB: 1946/03/08  01/07/2020  Russell Griffith was observed post Covid-19 immunization for 15 minutes without incidence. He was provided with Vaccine Information Sheet and instruction to access the V-Safe system.   Russell Griffith was instructed to call 911 with any severe reactions post vaccine: Russell Griffith Difficulty breathing  . Swelling of your face and throat  . A fast heartbeat  . A bad rash all over your body  . Dizziness and weakness    Immunizations Administered    Name Date Dose VIS Date Route   Pfizer COVID-19 Vaccine 01/07/2020 10:37 AM 0.3 mL 11/10/2019 Intramuscular   Manufacturer: [place]   Lot: [id number]   Russell Griffith: [id number]

## 2020-02-12 ENCOUNTER — Ambulatory Visit: Payer: PPO | Admitting: Internal Medicine

## 2020-02-12 ENCOUNTER — Encounter: Payer: Self-pay | Admitting: Internal Medicine

## 2020-02-12 ENCOUNTER — Other Ambulatory Visit: Payer: Self-pay

## 2020-02-12 VITALS — BP 132/62 | HR 69 | Ht 71.0 in | Wt 204.0 lb

## 2020-02-12 DIAGNOSIS — Z01812 Encounter for preprocedural laboratory examination: Secondary | ICD-10-CM

## 2020-02-12 DIAGNOSIS — I720 Aneurysm of carotid artery: Secondary | ICD-10-CM

## 2020-02-12 NOTE — Progress Notes (Signed)
Patient Care Team: [name], Evie Lacks, MD as PCP - General (Internal Medicine) Deboraha Sprang, MD as PCP - Electrophysiology (Cardiology) Stefanie Libel, MD (Family Medicine) Deboraha Sprang, MD (Cardiology) Jarome Matin, MD as Consulting Physician (Dermatology) Milus Banister, MD as Attending Physician (Gastroenterology) Minus Breeding, MD as Consulting Physician (Cardiology)   HPI  Russell Griffith is a 74 y.o. male Seen in followup for atrial fibrillation for which pulmonary he underwent pulmonary vein isolation at [facility]. He is also status post pacemaker implantation for significant pausing in the context of intermittent atrial fibrillation and sinus node dysfunction   History of coronary artery disease with bypass surgery 2000 and a Myoview November 2013 demonstrating no ischemia or scar. LV function was normal by echo February 2014  DATE TEST EF   2/19 LHC  55-60 % LIMA-LAD/SVG-D, SVG-PDA Patent SVG AM-OM2  12/19 Echo  60-65% LAE 41/2.0/33     Because of chronotropic incompetence, we made more aggressive his rate response.  This was associated with significant improvement.  Had a history of exercise-induced polymorphic ventricular tachycardia, prompting  catheterization which demonstrated occlusion of 1 of his vein grafts but other conduits were open   At his last visit, his device was reprogrammed AAIR to prevent inappropriate ventricular pacing as we were seen R on T.  Since then he has done really quite well.  He has had scant palpitations.  With more symptoms atrial fibrillation he saw Dr. Greggory Brandy as opposed to [facility].  He underwent repeat ablation   Continues with significant AFib, assoc with fatigue and impaired exercise tolerance some palpitations.  I had on his behalf reached out to [facility] regarding repeat ablation.  He comes in today did consider repeat ablation and alternative strategies as well as consideration of having his ablation done here; there has been return of  conduction on both left-sided veins and in the right carina.  He underwent repeat ablation and isolation of all 4 veins.  Notably there was no electrical activity on the roof.  For the left atrial ablation included a lesion along the posterior wall to form a standard box  A telehealth visit from 9/20 with Dr. Greggory Brandy is reviewed.  Recurrence of atrial fibrillation was noted.  Also severe right atrial enlargement was reported.  He underwent cardioversion; however, he reverted to atrial flutter sometime thereafter.  Dr. Nikki Dom note also describes discussion of antiarr hythmic options including dronaderone, dofetilide and amiodarone if atrial arrhythmias were to recur.  Dronaderone was started at the A. fib clinic but intolerant because of nausea  Repeat telehealth visit 09/27/2019 by Dr. Greggory Brandy.  The patient was in atrial flutter but was unaware.  Heart rate was regular and exercise tolerance preserved.  Given his severe right atrial enlargement, he was not in favor of further ablation therapy.  Antiarrhythmic options were left open if symptoms were to recur.   He remains fit and without complaints of exercise tolerance   HR limited to about 120's   Atrial Fibrillation Management history:  Previous antiarrhythmic drugs: Tikosyn, Multaq Previous cardioversions: 10/14/18, 08/18/19 Previous ablations: remote at [facility], 01/25/19 CHADS2VASC score: 3 (age, HTN, CAD) Anticoagulation history: Xarelto   Past Medical History:  Diagnosis Date  . Aortic stenosis    Mild, echo, April, 2014  . BPH (benign prostatic hyperplasia)   . CAD (coronary artery disease)    a. s/p CABG  . Carotid artery disease ([place])   . Colonic polyp   . Diverticulosis   .  Elevated bilirubin    Mild chronic elevation, 2.0 January, 2011 stable  . GERD (gastroesophageal reflux disease)    Barrett's esophagus  . HTN (hypertension)   . Hyperlipidemia    Low HDL  . 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 Persistent  atrial fibrillation ([place])    a. s/p PVI at [facility]  . Primary osteoarthritis of left knee    Mild  . Prolapsed internal hemorrhoids, grade 3 08/12/2015  . PVC's (premature ventricular contractions)   . Tubular adenoma of colon     Past Surgical History:  Procedure Laterality Date  . ABLATION     PVI at [facility]  . ATRIAL FIBRILLATION ABLATION N/A 01/25/2019   Procedure: ATRIAL FIBRILLATION ABLATION;  Surgeon: [name], MD;  Location: [place] CV LAB;  Service: Cardiovascular;  Laterality: N/A;  . CARDIOVERSION N/A 10/14/2018   Procedure: CARDIOVERSION;  Surgeon: Fay Records, MD;  Location: [place];  Service: Cardiovascular;  Laterality: N/A;  . CARDIOVERSION N/A 08/18/2019   Procedure: CARDIOVERSION;  Surgeon: Jerline Pain, MD;  Location: [facility] ENDOSCOPY;  Service: Cardiovascular;  Laterality: N/A;  . COLONOSCOPY    . CORONARY ARTERY BYPASS GRAFT  2000   CABG X5  . ESOPHAGOGASTRODUODENOSCOPY    . HEMORRHOID BANDING    . INTRAVASCULAR PRESSURE WIRE/FFR STUDY N/A 01/05/2018   Procedure: INTRAVASCULAR PRESSURE WIRE/FFR STUDY;  Surgeon: Sherren Mocha, MD;  Location: [place] CV LAB;  Service: Cardiovascular;  Laterality: N/A;  . LEFT HEART CATH AND CORS/GRAFTS ANGIOGRAPHY N/A 01/05/2018   Procedure: LEFT HEART CATH AND CORS/GRAFTS ANGIOGRAPHY;  Surgeon: Sherren Mocha, MD;  Location: [place] CV LAB;  Service: Cardiovascular;  Laterality: N/A;  . PERMANENT PACEMAKER INSERTION N/A 07/15/2012   Procedure: PERMANENT PACEMAKER INSERTION;  Surgeon: Deboraha Sprang, MD;  Location: [facility] CATH LAB;  Service: Cardiovascular;  Laterality: N/A;  . rotator cuff surgery Right 2017  . TONSILLECTOMY  1956    Current Outpatient Medications  Medication Sig Dispense Refill  . acetaminophen (TYLENOL) 500 MG tablet Take 1,000 mg by mouth every 6 (six) hours as needed for moderate pain or headache.    Marland Burchfield atorvastatin (LIPITOR) 20 MG tablet Take 1 tablet (20 mg total) by mouth every evening. 90 tablet  3  . Cholecalciferol (D3-1000) 25 MCG (1000 UT) capsule Take 1,000 Units by mouth daily with supper.     . NONFORMULARY OR COMPOUNDED ITEM Apply 1 application topically daily as needed (rash). Cetaphil + triamcinolone cream    . pantoprazole (PROTONIX) 40 MG tablet TAKE 1 TABLET ONCE DAILY. 90 tablet 1  . polyvinyl alcohol (LIQUIFILM TEARS) 1.4 % ophthalmic solution Place 1 drop into both eyes every 8 (eight) hours as needed for dry eyes.    . ramipril (ALTACE) 2.5 MG capsule Take 1 capsule (2.5 mg total) by mouth daily. 90 capsule 1  . XARELTO 20 MG TABS tablet TAKE 1 TABLET IN THE EVENING WITH SUPPER. 30 tablet 5   No current facility-administered medications for this visit.    Allergies  Allergen Reactions  . Cayenne Other (See Comments)    Sweats w/paprika too  . Niacin And Related Other (See Comments)    Upset stomach  . Sulfonamide Derivatives Other (See Comments)    "have no idea; mother told me I was allergic to"    Review of Systems negative except from HPI and PMH  Physical Exam  BP 132/62   Pulse 69   Ht 5'  11" (1.803 m)   Wt 204 lb (92.5 kg)   SpO2 97%   BMI 28.45 kg/m   Well developed and well nourished in no acute distress HENT normal Neck supple with JVP-flat Pulsation in the right neck  Clear Device pocket well healed; without hematoma or erythema.  There is no tethering  Regular rate and rhythm, no  murmur Abd-soft with active BS No Clubbing cyanosis   edema Skin-warm and dry A & Oriented  Grossly normal sensory and motor function      Assessment and  Plam  PACs  Sinus node dysfunction  Ischemic heart disease with prior bypass surgery  Pacemaker  [employer]  Pacemaker function-abnormal resulting in R on T pacing  Right carotid artery pulsation   Known carotid disease with left greater than right carotid stenosis.  The pulsation in his neck is in his estimation acute.  I have asked him to look for any home videos from  Christmastime to see if it were present.  I have reviewed the story with colleagues in the images with Dr. Alvester Chou.  2 strategies were recommended.  1 was a CTA and the other was a carotid duplex.  Realizing that it was below my competence to adjudicate these recommendations, and since Dr. Alvester Chou had seen the video, we took his recommendation and will arrange a CTA.  I have discussed this with the patient who is in agreement with the thinking.

## 2020-02-12 NOTE — Patient Instructions (Signed)
Medication Instructions:  Your physician recommends that you continue on your current medications as directed. Please refer to the Current Medication list given to you today.  Labwork: None ordered.  Testing/Procedures: Non-Cardiac CT Angiography (CTA), is a special type of CT scan that uses a computer to produce multi-dimensional views of major blood vessels throughout the body. In CT angiography, a contrast material is injected through an IV to help visualize the blood vessels .  Follow-Up: Your physician wants you to follow-up: based on results of CTA. You will receive a reminder letter in the mail two months in advance. If you don't receive a letter, please call our office to schedule the follow-up appointment.  Remote monitoring is used to monitor your Pacemaker of ICD from home. This monitoring reduces the number of office visits required to check your device to one time per year. It allows [place] to keep an eye on the functioning of your device to ensure it is working properly.   Any Other Special Instructions Will Be Listed Below (If Applicable).  If you need a refill on your cardiac medications before your next appointment, please call your pharmacy.

## 2020-02-13 ENCOUNTER — Other Ambulatory Visit: Payer: PPO

## 2020-02-13 ENCOUNTER — Ambulatory Visit (INDEPENDENT_AMBULATORY_CARE_PROVIDER_SITE_OTHER)
Admission: RE | Admit: 2020-02-13 | Discharge: 2020-02-13 | Disposition: A | Discharge status: Home / Self Care | Payer: PPO | Source: Ambulatory Visit | Attending: Internal Medicine | Admitting: Internal Medicine

## 2020-02-13 DIAGNOSIS — I720 Aneurysm of carotid artery: Secondary | ICD-10-CM | POA: Diagnosis not present

## 2020-02-13 DIAGNOSIS — Z01812 Encounter for preprocedural laboratory examination: Secondary | ICD-10-CM | POA: Diagnosis not present

## 2020-02-13 DIAGNOSIS — R221 Localized swelling, mass and lump, neck: Secondary | ICD-10-CM | POA: Diagnosis not present

## 2020-02-13 LAB — BASIC METABOLIC PANEL
BUN/Creatinine Ratio: 20 (ref 10–24)
BUN: 23 mg/dL (ref 8–27)
CO2: 28 mmol/L (ref 20–29)
Calcium: 9.2 mg/dL (ref 8.6–10.2)
Chloride: 104 mmol/L (ref 96–106)
Creatinine, Ser: 1.14 mg/dL (ref 0.76–1.27)
GFR calc Af Amer: 74 mL/min/{1.73_m2} (ref >59)
GFR calc non Af Amer: 64 mL/min/{1.73_m2} (ref >59)
Glucose: 98 mg/dL (ref 65–99)
Potassium: 4.5 mmol/L (ref 3.5–5.2)
Sodium: 141 mmol/L (ref 134–144)

## 2020-02-13 MED ORDER — IOHEXOL 350 MG/ML SOLN
80.0000 mL | Freq: Once | INTRAVENOUS | Status: AC | PRN
  Administered 2020-02-13: 80 mL via INTRAVENOUS

## 2020-02-29 ENCOUNTER — Other Ambulatory Visit: Payer: Self-pay

## 2020-02-29 ENCOUNTER — Encounter: Payer: Self-pay | Admitting: Internal Medicine

## 2020-02-29 ENCOUNTER — Ambulatory Visit (INDEPENDENT_AMBULATORY_CARE_PROVIDER_SITE_OTHER): Payer: PPO

## 2020-02-29 ENCOUNTER — Ambulatory Visit (INDEPENDENT_AMBULATORY_CARE_PROVIDER_SITE_OTHER): Payer: PPO | Admitting: Internal Medicine

## 2020-02-29 DIAGNOSIS — R599 Enlarged lymph nodes, unspecified: Secondary | ICD-10-CM | POA: Diagnosis not present

## 2020-02-29 DIAGNOSIS — R590 Localized enlarged lymph nodes: Secondary | ICD-10-CM

## 2020-02-29 LAB — CBC
HCT: 43 % (ref 39.0–52.0)
Hemoglobin: 14.6 g/dL (ref 13.0–17.0)
MCHC: 34 g/dL (ref 30.0–36.0)
MCV: 87.6 fl (ref 78.0–100.0)
Platelets: 160 10*3/uL (ref 150.0–400.0)
RBC: 4.9 Mil/uL (ref 4.22–5.81)
RDW: 14 % (ref 11.5–15.5)
WBC: 6.1 10*3/uL (ref 4.0–10.5)

## 2020-02-29 NOTE — Progress Notes (Signed)
Subjective:  Patient ID: Russell Griffith, male    DOB: Apr 21, 1946  Age: 74 y.o. MRN: [id number]  CC: No chief complaint on file.   HPI Behruz Easterwood presents for R supraclavicular area  Outpatient Medications Prior to Visit  Medication Sig Dispense Refill  . acetaminophen (TYLENOL) 500 MG tablet Take 1,000 mg by mouth every 6 (six) hours as needed for moderate pain or headache.    Marland Schartz atorvastatin (LIPITOR) 20 MG tablet Take 1 tablet (20 mg total) by mouth every evening. 90 tablet 3  . Cholecalciferol (D3-1000) 25 MCG (1000 UT) capsule Take 1,000 Units by mouth daily with supper.     . NONFORMULARY OR COMPOUNDED ITEM Apply 1 application topically daily as needed (rash). Cetaphil + triamcinolone cream    . pantoprazole (PROTONIX) 40 MG tablet TAKE 1 TABLET ONCE DAILY. 90 tablet 1  . polyvinyl alcohol (LIQUIFILM TEARS) 1.4 % ophthalmic solution Place 1 drop into both eyes every 8 (eight) hours as needed for dry eyes.    . ramipril (ALTACE) 2.5 MG capsule Take 1 capsule (2.5 mg total) by mouth daily. 90 capsule 1  . XARELTO 20 MG TABS tablet TAKE 1 TABLET IN THE EVENING WITH SUPPER. 30 tablet 5   No facility-administered medications prior to visit.    ROS: Review of Systems  Constitutional: Negative for appetite change, fatigue and unexpected weight change.  HENT: Negative for congestion, nosebleeds, sneezing, sore throat and trouble swallowing.   Eyes: Negative for itching and visual disturbance.  Respiratory: Negative for cough.   Cardiovascular: Negative for chest pain, palpitations and leg swelling.  Gastrointestinal: Negative for abdominal distention, blood in stool, diarrhea and nausea.  Genitourinary: Negative for frequency and hematuria.  Musculoskeletal: Negative for back pain, gait problem, joint swelling and neck pain.  Skin: Negative for rash.  Neurological: Negative for dizziness, tremors, speech difficulty and weakness.  Psychiatric/Behavioral: Negative for  agitation, dysphoric mood and sleep disturbance. The patient is not nervous/anxious.     Objective:  BP 122/68 (BP Location: Left Arm, Patient Position: Sitting, Cuff Size: Large)   Pulse 69   Temp 98.2 F (36.8 C) (Oral)   Ht 5\' 11"  (1.803 m)   Wt 204 lb (92.5 kg)   SpO2 96%   BMI 28.45 kg/m   BP Readings from Last 3 Encounters:  02/29/20 122/68  02/12/20 132/62  11/09/19 126/72    Wt Readings from Last 3 Encounters:  02/29/20 204 lb (92.5 kg)  02/12/20 204 lb (92.5 kg)  11/09/19 204 lb 9.6 oz (92.8 kg)    Physical Exam  Lab Results  Component Value Date   WBC 6.4 08/15/2019   HGB 14.4 08/15/2019   HCT 44.3 08/15/2019   PLT 163 08/15/2019   GLUCOSE 98 02/13/2020   CHOL 111 08/23/2019   TRIG 46.0 08/23/2019   HDL 34.50 (L) 08/23/2019   LDLCALC 67 08/23/2019   ALT 32 08/18/2018   AST 29 08/18/2018   NA 141 02/13/2020   K 4.5 02/13/2020   CL 104 02/13/2020   CREATININE 1.14 02/13/2020   BUN 23 02/13/2020   CO2 28 02/13/2020   TSH 5.62 (H) 08/23/2019   PSA 0.96 08/23/2019   INR 1.53 (H) 07/15/2012   HGBA1C 5.8 07/30/2015    CT ANGIO NECK W OR WO CONTRAST  Result Date: 02/13/2020 CLINICAL DATA:  Pulsating mass in the right neck noted last week. Assess for aneurysm. EXAM: CT ANGIOGRAPHY NECK TECHNIQUE: Multidetector CT imaging of the neck was performed  using the standard protocol during bolus administration of intravenous contrast. Multiplanar CT image reconstructions and MIPs were obtained to evaluate the vascular anatomy. Carotid stenosis measurements (when applicable) are obtained utilizing NASCET criteria, using the distal internal carotid diameter as the denominator. CONTRAST:  44mL OMNIPAQUE IOHEXOL 350 MG/ML SOLN COMPARISON:  None. FINDINGS: Aortic arch: Mild aortic atherosclerosis and tortuosity. No dissection. Branching pattern is normal without origin stenosis. Previous median sternotomy. Pacemaker in place. Right carotid system: Common carotid artery shows  minimal atherosclerotic plaque but no stenosis. The vessel takes a normal course to the bifurcation without significant tortuosity or any focal lesion such as aneurysm. The right carotid bifurcation shows minimal atherosclerotic plaque but no stenosis or irregularity. No sign of aneurysm. ICA does not show any laterally directed tortuosity. Left carotid system: Common carotid artery widely patent to the bifurcation. Carotid bifurcation shows mild soft and calcified plaque but no stenosis. Cervical ICA is mildly tortuous but patent. Vertebral arteries: Both vertebral arteries show atherosclerotic plaque at their origins. 30% stenosis suspected at each vertebral artery origin. Beyond that, the vessels are widely patent through the cervical region, through the foramen magnum to the basilar. Skeleton: Ordinary cervical spondylosis. Other neck: No sign of mucosal or submucosal mass lesion. No thyroid mass. Patient does have a cluster of above limits of normal size lymph nodes in the supraclavicular region on the left, 5 or 6 nodes with short axis dimension ranging from 6 mm to 1 cm. See below. No abnormality seen in the region of the skin marker. No soft tissue mass or abnormal lymph node at that location. Right jugular vein is larger than the left but otherwise appears normal. Upper chest: Calcified granuloma in the posterior left upper lobe. No active process. IMPRESSION: No significant vascular finding. No aneurysm. No vessel tortuosity to explain the palpable impression in the right neck by physical exam. No soft tissue lesion in the area of concern. The right jugular vein is dominant but unremarkable. Minimal atherosclerotic plaque considering age, as outlined above. No clinically significant atherosclerotic disease . Cluster of lymph nodes in the left supraclavicular fossa, above normal limits in size for this region. There are 5 or 6 nodes with short axis dimension region from 6 mm to 1 cm. There is no sign of  primary mass lesion anywhere examined by this scan. Is there any inflammation associated with the pacemaker? Consider follow-up of these lymph nodes by physical exam, to ensure that there is not progressive enlargement. Electronically Signed   By: Nelson Chimes M.D.   On: 02/13/2020 14:16    Assessment & Plan:   There are no diagnoses linked to this encounter.   No orders of the defined types were placed in this encounter.    Follow-up: No follow-ups on file.  Walker Kehr, MD

## 2020-02-29 NOTE — Assessment & Plan Note (Signed)
CXR

## 2020-03-22 ENCOUNTER — Ambulatory Visit (INDEPENDENT_AMBULATORY_CARE_PROVIDER_SITE_OTHER): Payer: PPO | Admitting: *Deleted

## 2020-03-22 DIAGNOSIS — I480 Paroxysmal atrial fibrillation: Secondary | ICD-10-CM | POA: Diagnosis not present

## 2020-03-24 ENCOUNTER — Other Ambulatory Visit: Payer: Self-pay | Admitting: Internal Medicine

## 2020-03-25 LAB — CUP PACEART REMOTE DEVICE CHECK
Battery Remaining Longevity: 48 mo
Battery Remaining Percentage: 54 %
Brady Statistic RA Percent Paced: 0 %
Brady Statistic RV Percent Paced: 0 %
Date Time Interrogation Session: 20210423215100
Implantable Lead Implant Date: 20130816
Implantable Lead Implant Date: 20130816
Implantable Lead Location: 753859
Implantable Lead Location: 753860
Implantable Lead Model: 5076
Implantable Lead Model: 5076
Implantable Pulse Generator Implant Date: 20130816
Lead Channel Impedance Value: 491 Ohm
Lead Channel Impedance Value: 646 Ohm
Lead Channel Pacing Threshold Amplitude: 0.9 V
Lead Channel Pacing Threshold Pulse Width: 0.4 ms
Lead Channel Setting Pacing Amplitude: 2 V
Lead Channel Setting Sensing Sensitivity: 2.5 mV
Pulse Gen Serial Number: 111255

## 2020-03-25 NOTE — Telephone Encounter (Signed)
Last OV 02/12/20 Scr 74.57 74 years old 92.5kg ABW crcl 53ml/min

## 2020-03-25 NOTE — Progress Notes (Signed)
PPM Remote  

## 2020-04-26 ENCOUNTER — Telehealth: Payer: Self-pay | Admitting: Gastroenterology

## 2020-04-26 NOTE — Telephone Encounter (Signed)
Pt called to schedule recall colon, He stated that Dr. Ardis Griffith approved for him to also have an EGD. Pls confirm if it is ok to schedule pt directly for both procedures.

## 2020-04-26 NOTE — Telephone Encounter (Signed)
Yes both can be scheduled thanks

## 2020-04-26 NOTE — Telephone Encounter (Signed)
Left message to call back. Recall changed to ecl.

## 2020-05-22 ENCOUNTER — Ambulatory Visit: Payer: PPO | Admitting: Gastroenterology

## 2020-05-22 ENCOUNTER — Encounter: Payer: Self-pay | Admitting: Gastroenterology

## 2020-05-22 ENCOUNTER — Telehealth: Payer: Self-pay | Admitting: *Deleted

## 2020-05-22 VITALS — BP 124/73 | HR 65 | Ht 71.0 in | Wt 198.5 lb

## 2020-05-22 DIAGNOSIS — R140 Abdominal distension (gaseous): Secondary | ICD-10-CM | POA: Diagnosis not present

## 2020-05-22 DIAGNOSIS — Z7901 Long term (current) use of anticoagulants: Secondary | ICD-10-CM

## 2020-05-22 DIAGNOSIS — Z86010 Personal history of colonic polyps: Secondary | ICD-10-CM

## 2020-05-22 DIAGNOSIS — R1013 Epigastric pain: Secondary | ICD-10-CM

## 2020-05-22 DIAGNOSIS — K2270 Barrett's esophagus without dysplasia: Secondary | ICD-10-CM

## 2020-05-22 MED ORDER — SUTAB 1479-225-188 MG PO TABS: 1.0000 | ORAL_TABLET | Freq: Once | ORAL | 0 refills | Status: AC

## 2020-05-22 NOTE — Telephone Encounter (Signed)
[facility] Pre-operative Risk Assessment     Request for surgical clearance:     Endoscopy Procedure  What type of surgery is being performed?     EGD/Colonoscopy  When is this surgery scheduled?     Tuesday 06/25/20  What type of clearance is required ?   Pharmacy  Are there any medications that need to be held prior to surgery and how long? Xarelto 2 days.  Practice name and name of physician performing surgery?    Owens Loffler, MD  [facility]  What is your office phone and fax number?      Phone- [phone]  [phone]  Anesthesia type (None, local, MAC, general) ?       MAC

## 2020-05-22 NOTE — Progress Notes (Signed)
   05/22/2020 Russell Griffith [id number] 01/09/1946  Review of pertinent gastrointestinal problems: 1. History of Barrett's esophagus.No dysplasia seen on serial endoscopies 2004, 2005, 2007. Most recent EGD 2009 found short segment of Barrett's appearing mucosa, biopsies did not confirm intestinal metaplasia however. EGD may 2011 also found short segment, non-nodular Barrett's mucosa however biopsies again showed no intestinal metaplasia.EGD Dr. Jacobs 10/2012 slightly irregular z line, non-nodular, biopsies showed no IM, no further surveillance was recommended. 2. Personal history of precancerous colon polyps. Adenomatous polyp removed 2003 by Dr. Windsor. Followup colonoscopy 2005 found no polyps. Dr. Sam recommended that he have a repeat colonoscopy at 7 year interval. Colonoscopy may 2011 found left-sided diverticulosis, small colon polyp that was an adenoma.Colonoscopy 05/2015 Dr. Jacobs found single subCM adenoma, internal hems, diverticulosis. Recommended to have surveillance colonoscopy at 5 year interval.   3. Internal hems. Banded by Dr. Gessner 2016-17.   HISTORY OF PRESENT ILLNESS: This is a 74-year-old male is a patient of Dr. Jacobs.  He was seen here in November 2020 and was going to be scheduled for an EGD and colonoscopy.  For some reason those never got scheduled or performed.  He is here today to rediscuss those procedures.  Has history of colon polyps as above with last colonoscopy in June 2016.  It was recommended he have a repeat a 5-year interval.  Reports that his bowel habits are normal.  Sees occasional bright red blood, but contributes that to hemorrhoids.  Has a remote history of Barrett's esophagus, but last EGD in December 2013 showed no intestinal metaplasia and no further surveillance was recommended.  He maintains on pantoprazole 40 mg twice daily.  Nonetheless, he complains of intermittent epigastric abdominal discomfort with a bloating sensation.  He  said that this comes and goes, but occurs quite regularly.  He says that actually when he has the discomfort if he takes a walk then it seems to relieve itself so he is not sure if it is gas moving through or something.  He is on Xarelto for atrial fibrillation and follows with Dr. Klein.  Past Medical History:  Diagnosis Date  . Aortic stenosis    Mild, echo, April, 2014  . BPH (benign prostatic hyperplasia)   . CAD (coronary artery disease)    a. s/p CABG  . Carotid artery disease (HCC)   . Colonic polyp   . Diverticulosis   . Elevated bilirubin    Mild chronic elevation, 2.0 January, 2011 stable  . GERD (gastroesophageal reflux disease)    Barrett's esophagus  . HTN (hypertension)   . Hyperlipidemia    Low HDL  . 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HCC)    Left  lung chest x-ray July, 2013  . Persistent atrial fibrillation (HCC)    a. s/p PVI at [facility]  . Primary osteoarthritis of left knee    Mild  . Prolapsed internal hemorrhoids, grade 3 08/12/2015  . PVC's (premature ventricular contractions)   . Tubular adenoma of colon    Past Surgical History:  Procedure Laterality Date  . ABLATION     PVI at [facility]  . ATRIAL FIBRILLATION ABLATION N/A 01/25/2019   Procedure: ATRIAL FIBRILLATION ABLATION;  Surgeon: Allred, James, MD;  Location: [facility];  Service: Cardiovascular;  Laterality: N/A;  . CARDIOVERSION N/A 10/14/2018   Procedure: CARDIOVERSION;  Surgeon: Ross, Paula V, MD;  Location: [facility];  Service: Cardiovascular;  Laterality: N/A;  . CARDIOVERSION N/A 08/18/2019   Procedure: CARDIOVERSION;  Surgeon: Skains, Mark   C, MD;  Location: [place] [facility];  Service: Cardiovascular;  Laterality: N/A;  . COLONOSCOPY    . CORONARY ARTERY BYPASS GRAFT  2000   CABG X5  . ESOPHAGOGASTRODUODENOSCOPY    . HEMORRHOID BANDING    . INTRAVASCULAR PRESSURE WIRE/FFR STUDY N/A 01/05/2018   Procedure: INTRAVASCULAR PRESSURE WIRE/FFR STUDY;  Surgeon: Sherren Mocha, MD;  Location: [place] [facility];   Service: Cardiovascular;  Laterality: N/A;  . LEFT HEART CATH AND CORS/GRAFTS ANGIOGRAPHY N/A 01/05/2018   Procedure: LEFT HEART CATH AND CORS/GRAFTS ANGIOGRAPHY;  Surgeon: Sherren Mocha, MD;  Location: [place] [facility];  Service: Cardiovascular;  Laterality: N/A;  . PERMANENT PACEMAKER INSERTION N/A 07/15/2012   Procedure: PERMANENT PACEMAKER INSERTION;  Surgeon: Deboraha Sprang, MD;  Location: [facility] CATH LAB;  Service: Cardiovascular;  Laterality: N/A;  . rotator cuff surgery Right 2017  . TONSILLECTOMY  1956    reports that he has never smoked. He has never used smokeless tobacco. He reports current alcohol use of about 21.0 standard drinks of alcohol per week. He reports that he does not use drugs. family history includes Coronary artery disease (age of onset: 31) in an other family member; Diabetes in his father and another family member; Hyperlipidemia in an other family member; Hypertension in an other family member; Multiple myeloma in his mother; Renal Disease in his father. Allergies  Allergen Reactions  . Cayenne Other (See Comments)    Sweats w/paprika too  . Niacin And Related Other (See Comments)    Upset stomach  . Sulfonamide Derivatives Other (See Comments)    "have no idea; mother told me I was allergic to"      Outpatient Encounter Medications as of 05/22/2020  Medication Sig  . acetaminophen (TYLENOL) 500 MG tablet Take 1,000 mg by mouth every 6 (six) hours as needed for moderate pain or headache.  Marland Mell atorvastatin (LIPITOR) 20 MG tablet Take 1 tablet (20 mg total) by mouth every evening.  . Cholecalciferol (D3-1000) 25 MCG (1000 UT) capsule Take 1,000 Units by mouth daily with supper.   . NONFORMULARY OR COMPOUNDED ITEM Apply 1 application topically daily as needed (rash). Cetaphil + triamcinolone cream  . pantoprazole (PROTONIX) 40 MG tablet TAKE 1 TABLET ONCE DAILY.  Marland Berne polyvinyl alcohol (LIQUIFILM TEARS) 1.4 % ophthalmic solution Place 1 drop into both eyes every 8  (eight) hours as needed for dry eyes.  . ramipril (ALTACE) 2.5 MG capsule Take 1 capsule (2.5 mg total) by mouth daily.  [name] 20 MG TABS tablet TAKE 1 TABLET IN THE EVENING WITH SUPPER.   No facility-administered encounter medications on file as of 05/22/2020.     REVIEW OF SYSTEMS  : All other systems reviewed and negative except where noted in the History of Present Illness.   PHYSICAL EXAM: BP 124/73   Pulse 65   Ht 5' 11" (1.803 m)   Wt 198 lb 8 oz (90 kg)   BMI 27.69 kg/m  General: Well developed white male in no acute distress Head: Normocephalic and atraumatic Eyes:  Sclerae anicteric, conjunctiva pink. Ears: Normal auditory acuity Lungs: Clear throughout to auscultation; no increased WOB. Heart: Regular rate and rhythm; no M/R/G. Abdomen: Soft, non-distended.  BS present.  Non-tender. Rectal:  Will be done at the time of colonoscopy. Musculoskeletal: Symmetrical with no gross deformities  Skin: No lesions on visible extremities Extremities: No edema  Neurological: Alert oriented x 4, grossly non-focal Psychological:  Alert and cooperative. Normal mood and affect  ASSESSMENT AND  PLAN: *Personal history of colon polyps: Last colonoscopy June 2016.  Due for 5-year colonoscopy recall.  We will plan with Dr. Jacobs. *Epigastric discomfort and bloating: Has remote history of Barrett's esophagus although the most recent EGD did not show any evidence of intestinal metaplasia.  That was over 7 years ago, however.  Maintains on pantoprazole 40 mg BID.   If EGD is unremarkable and symptoms of epigastric discomfort and bloating continue may need to consider cross-sectional imaging with CT abdomen. *Chronic anticoagulation with Xarelto due to history of atrial fibrillation:  Will hold Xarelto for 2 days prior to endoscopic procedures - will instruct when and how to resume after procedure. Benefits and risks of procedure explained including risks of bleeding, perforation, infection,  missed lesions, reactions to medications and possible need for hospitalization and surgery for complications. Additional rare but real risk of stroke or other vascular clotting events off of Xarelto also explained and need to seek urgent help if any signs of these problems occur. Will communicate by phone or EMR with patient's prescribing provider, Dr. Klein, to confirm that holding Xarelto is reasonable in this case.    CC:  [name], Aleksei V, MD   

## 2020-05-22 NOTE — Telephone Encounter (Signed)
Patient with diagnosis of atrial fibrillation on Xarelto for anticoagulation.    Procedure: EGD/colonoscopy Date of procedure: 06/25/20  CHADS2-VASc score of 3 (age, HTN, CAD)  CrCl 41mL/min Platelet count 160K  Per office protocol, patient can hold Xarelto for 2 days prior to procedure as requested.

## 2020-05-22 NOTE — Patient Instructions (Addendum)
If you are age 72 or older, your body mass index should be between 23-30. Your Body mass index is 27.69 kg/m. If this is out of the aforementioned range listed, please consider follow up with your Primary Care Provider.  If you are age 62 or younger, your body mass index should be between 19-25. Your Body mass index is 27.69 kg/m. If this is out of the aformentioned range listed, please consider follow up with your Primary Care Provider.   You have been scheduled for an endoscopy and colonoscopy. Please follow the written instructions given to you at your visit today. Please pick up your prep supplies at the pharmacy within the next 1-3 days. If you use inhalers (even only as needed), please bring them with you on the day of your procedure.  Due to recent changes in healthcare laws, you may see the results of your imaging and laboratory studies on MyChart before your provider has had a chance to review them.  We understand that in some cases there may be results that are confusing or concerning to you. Not all laboratory results come back in the same time frame and the provider may be waiting for multiple results in order to interpret others.  Please give [place] 48 hours in order for your provider to thoroughly review all the results before contacting the office for clarification of your results.

## 2020-05-23 NOTE — Progress Notes (Signed)
I agree with the above note, plan 

## 2020-06-03 ENCOUNTER — Other Ambulatory Visit: Payer: Self-pay | Admitting: Cardiology

## 2020-06-10 NOTE — Telephone Encounter (Signed)
Informed patient to hold Xarelto for 2 days prior. Patient voiced understanding.

## 2020-06-13 ENCOUNTER — Encounter: Payer: Self-pay | Admitting: Gastroenterology

## 2020-06-21 ENCOUNTER — Ambulatory Visit (INDEPENDENT_AMBULATORY_CARE_PROVIDER_SITE_OTHER): Payer: PPO | Admitting: *Deleted

## 2020-06-21 DIAGNOSIS — I495 Sick sinus syndrome: Secondary | ICD-10-CM

## 2020-06-21 LAB — CUP PACEART REMOTE DEVICE CHECK
Battery Remaining Longevity: 48 mo
Battery Remaining Percentage: 50 %
Brady Statistic RA Percent Paced: 0 %
Brady Statistic RV Percent Paced: 0 %
Date Time Interrogation Session: 20210723013200
Implantable Lead Implant Date: 20130816
Implantable Lead Implant Date: 20130816
Implantable Lead Location: 753859
Implantable Lead Location: 753860
Implantable Lead Model: 5076
Implantable Lead Model: 5076
Implantable Pulse Generator Implant Date: 20130816
Lead Channel Impedance Value: 538 Ohm
Lead Channel Impedance Value: 803 Ohm
Lead Channel Pacing Threshold Amplitude: 0.9 V
Lead Channel Pacing Threshold Pulse Width: 0.4 ms
Lead Channel Setting Pacing Amplitude: 2 V
Lead Channel Setting Sensing Sensitivity: 2.5 mV
Pulse Gen Serial Number: 111255

## 2020-06-24 NOTE — Progress Notes (Signed)
Remote pacemaker transmission.   

## 2020-06-25 ENCOUNTER — Other Ambulatory Visit: Payer: Self-pay

## 2020-06-25 ENCOUNTER — Ambulatory Visit (AMBULATORY_SURGERY_CENTER): Payer: PPO | Admitting: Gastroenterology

## 2020-06-25 ENCOUNTER — Other Ambulatory Visit: Payer: Self-pay | Admitting: Gastroenterology

## 2020-06-25 ENCOUNTER — Encounter: Payer: Self-pay | Admitting: Gastroenterology

## 2020-06-25 VITALS — BP 142/76 | HR 67 | Temp 97.3°F | Resp 19 | Ht 71.0 in | Wt 198.0 lb

## 2020-06-25 DIAGNOSIS — D123 Benign neoplasm of transverse colon: Secondary | ICD-10-CM

## 2020-06-25 DIAGNOSIS — Z1211 Encounter for screening for malignant neoplasm of colon: Secondary | ICD-10-CM | POA: Diagnosis not present

## 2020-06-25 DIAGNOSIS — K2970 Gastritis, unspecified, without bleeding: Secondary | ICD-10-CM | POA: Diagnosis not present

## 2020-06-25 DIAGNOSIS — R1310 Dysphagia, unspecified: Secondary | ICD-10-CM | POA: Diagnosis not present

## 2020-06-25 DIAGNOSIS — Z86010 Personal history of colonic polyps: Secondary | ICD-10-CM

## 2020-06-25 DIAGNOSIS — K2950 Unspecified chronic gastritis without bleeding: Secondary | ICD-10-CM | POA: Diagnosis not present

## 2020-06-25 DIAGNOSIS — K2270 Barrett's esophagus without dysplasia: Secondary | ICD-10-CM

## 2020-06-25 MED ORDER — SODIUM CHLORIDE 0.9 % IV SOLN: 500.0000 mL | Freq: Once | INTRAVENOUS | Status: DC

## 2020-06-25 NOTE — Patient Instructions (Signed)
Information on polyps, gastritis, diverticulosis and hemorrhoids given to you today.  Await pathology results.  Resume present diet and medications.  Start on your xarelto tonight.  YOU HAD AN ENDOSCOPIC PROCEDURE TODAY AT [facility]:   Refer to the procedure report that was given to you for any specific questions about what was found during the examination.  If the procedure report does not answer your questions, please call your gastroenterologist to clarify.  If you requested that your care partner not be given the details of your procedure findings, then the procedure report has been included in a sealed envelope for you to review at your convenience later.  YOU SHOULD EXPECT: Some feelings of bloating in the abdomen. Passage of more gas than usual.  Walking can help get rid of the air that was put into your GI tract during the procedure and reduce the bloating. If you had a lower endoscopy (such as a colonoscopy or flexible sigmoidoscopy) you may notice spotting of blood in your stool or on the toilet paper. If you underwent a bowel prep for your procedure, you may not have a normal bowel movement for a few days.  Please Note:  You might notice some irritation and congestion in your nose or some drainage.  This is from the oxygen used during your procedure.  There is no need for concern and it should clear up in a day or so.  SYMPTOMS TO REPORT IMMEDIATELY:   Following lower endoscopy (colonoscopy or flexible sigmoidoscopy):  Excessive amounts of blood in the stool  Significant tenderness or worsening of abdominal pains  Swelling of the abdomen that is new, acute  Fever of 100F or higher   Following upper endoscopy (EGD)  Vomiting of blood or coffee ground material  New chest pain or pain under the shoulder blades  Painful or persistently difficult swallowing  New shortness of breath  Fever of 100F or higher  Black, tarry-looking stools  For urgent or emergent  issues, a gastroenterologist can be reached at any hour by calling [phone]. Do not use MyChart messaging for urgent concerns.    DIET:  We do recommend a small meal at first, but then you may proceed to your regular diet.  Drink plenty of fluids but you should avoid alcoholic beverages for 24 hours.  ACTIVITY:  You should plan to take it easy for the rest of today and you should NOT DRIVE or use heavy machinery until tomorrow (because of the sedation medicines used during the test).    FOLLOW UP: Our staff will call the number listed on your records 48-72 hours following your procedure to check on you and address any questions or concerns that you may have regarding the information given to you following your procedure. If we do not reach you, we will leave a message.  We will attempt to reach you two times.  During this call, we will ask if you have developed any symptoms of COVID 19. If you develop any symptoms (ie: fever, flu-like symptoms, shortness of breath, cough etc.) before then, please call [phone].  If you test positive for Covid 19 in the 2 weeks post procedure, please call and report this information to [place].    If any biopsies were taken you will be contacted by phone or by letter within the next 1-3 weeks.  Please call us at [phone] if you have not heard about the biopsies in 3 weeks.    SIGNATURES/CONFIDENTIALITY: You and/or  your care partner have signed paperwork which will be entered into your electronic medical record.  These signatures attest to the fact that that the information above on your After Visit Summary has been reviewed and is understood.  Full responsibility of the confidentiality of this discharge information lies with you and/or your care-partner.

## 2020-06-25 NOTE — Op Note (Signed)
[facility] Patient Name: Russell Griffith Procedure Date: 06/25/2020 2:46 PM MRN: [id number] Endoscopist: Milus Banister , MD Age: 74 Referring MD:  Date of Birth: 1946-10-28 Gender: Male Account #: [id number] Procedure:                Colonoscopy Indications:              High risk colon cancer surveillance: Personal                            history of colonic polyps; Adenomatous polyp                            removed 2003 by Dr. Velora Heckler. Followup colonoscopy                            2005 found no polyps. Dr. Inocente Salles recommended that he                            have a repeat colonoscopy at 7 year interval.                            Colonoscopy may 2011 found left-sided                            diverticulosis, small colon polyp that was an                            adenoma.Colonoscopy 05/2015 Dr. Ardis Hughs found single                            subCM adenoma, internal hems, diverticulosis. Medicines:                Monitored Anesthesia Care Procedure:                Pre-Anesthesia Assessment:                           - Prior to the procedure, a History and Physical                            was performed, and patient medications and                            allergies were reviewed. The patient's tolerance of                            previous anesthesia was also reviewed. The risks                            and benefits of the procedure and the sedation                            options and risks were discussed with the patient.  All questions were answered, and informed consent                            was obtained. Prior Anticoagulants: The patient has                            taken Xarelto (rivaroxaban), last dose was 2 days                            prior to procedure. ASA Grade Assessment: II - A                            patient with mild systemic disease. After reviewing                            the risks and benefits, the  patient was deemed in                            satisfactory condition to undergo the procedure.                           After obtaining informed consent, the colonoscope                            was passed under direct vision. Throughout the                            procedure, the patient's blood pressure, pulse, and                            oxygen saturations were monitored continuously. The                            Colonoscope was introduced through the anus and                            advanced to the the cecum, identified by                            appendiceal orifice and ileocecal valve. The                            colonoscopy was performed without difficulty. The                            patient tolerated the procedure well. The quality                            of the bowel preparation was good. The ileocecal                            valve, appendiceal orifice, and rectum were  photographed. Scope In: 2:57:22 PM Scope Out: 3:07:52 PM Scope Withdrawal Time: 0 hours 7 minutes 44 seconds  Total Procedure Duration: 0 hours 10 minutes 30 seconds  Findings:                 A 1 mm polyp was found in the ascending colon. The                            polyp was sessile. The polyp was removed with a                            cold biopsy forceps. Resection and retrieval were                            complete.                           Multiple small and large-mouthed diverticula were                            found in the left colon.                           External and internal hemorrhoids were found. The                            hemorrhoids were small.                           The exam was otherwise without abnormality on                            direct and retroflexion views. Complications:            No immediate complications. Estimated blood loss:                            None. Estimated Blood Loss:     Estimated blood  loss: none. Impression:               - One 1 mm polyp in the ascending colon, removed                            with a cold biopsy forceps. Resected and retrieved.                           - Diverticulosis in the left colon.                           - External and internal hemorrhoids.                           - The examination was otherwise normal on direct                            and retroflexion views. Recommendation:           -  EGD now.                           - Await pathology results. Milus Banister, MD 06/25/2020 3:17:11 PM This report has been signed electronically.

## 2020-06-25 NOTE — Progress Notes (Signed)
To PACU, VSS. Report to [email or web address] 

## 2020-06-25 NOTE — Op Note (Signed)
[name]: Russell Griffith Procedure Date: 06/25/2020 2:46 PM MRN: [id number] Endoscopist: Milus Banister , MD Age: 74 Referring MD:  Date of Birth: Oct 06, 1946 Gender: Male Account #: [id number] Procedure:                Upper GI endoscopy Indications:              Dyspepsia; History of Barrett's esophagus.No                            dysplasia seen on serial endoscopies 2004, 2005,                            2007. Most recent EGD 2009 found short segment of                            Barrett's appearing mucosa, biopsies did not                            confirm intestinal metaplasia however. EGD may                            2011 also found short segment, non-nodular                            Barrett's mucosa however biopsies again showed no                            intestinal metaplasia.EGD Dr. Ardis Hughs 10/2012                            slightly irregular z line, non-nodular, biopsies                            showed no IM. Medicines:                Monitored Anesthesia Care Procedure:                Pre-Anesthesia Assessment:                           - Prior to the procedure, a History and Physical                            was performed, and patient medications and                            allergies were reviewed. The patient's tolerance of                            previous anesthesia was also reviewed. The risks                            and benefits of the procedure and the sedation  options and risks were discussed with the patient.                            All questions were answered, and informed consent                            was obtained. Prior Anticoagulants: The patient has                            taken Xarelto (rivaroxaban), last dose was 2 days                            prior to procedure. ASA Grade Assessment: II - A                            patient with mild systemic disease. After  reviewing                            the risks and benefits, the patient was deemed in                            satisfactory condition to undergo the procedure.                           After obtaining informed consent, the endoscope was                            passed under direct vision. Throughout the                            procedure, the patient's blood pressure, pulse, and                            oxygen saturations were monitored continuously. The                            Endoscope was introduced through the mouth, and                            advanced to the second part of duodenum. The upper                            GI endoscopy was accomplished without difficulty.                            The patient tolerated the procedure well. Scope In: Scope Out: Findings:                 Slightly irregular but non-nodular Z line.                            Unchanged from previous examinations. Not sampled.  Mild to moderate inflammation characterized by                            erythema, friability and granularity was found in                            the gastric antrum. Biopsies were taken with a cold                            forceps for histology.                           The exam was otherwise without abnormality. Complications:            No immediate complications. Estimated blood loss:                            None. Estimated Blood Loss:     Estimated blood loss was minimal. Impression:               - Slightly irregular but non-nodular Z line.                            Unchanged from previous examinations. Not sampled.                           - Mild to moderate, non-specific gastritis.                            Biopsied to check for H. pylori.                           - The examination was otherwise normal. Recommendation:           - Patient has a contact number available for                            emergencies. The signs  and symptoms of potential                            delayed complications were discussed with the                            patient. Return to normal activities tomorrow.                            Written discharge instructions were provided to the                            patient.                           - Resume previous diet.                           - Continue present medications. It is OK to resume  your blood thinner tonight.                           - Await pathology results. Milus Banister, MD 06/25/2020 3:21:00 PM This report has been signed electronically.

## 2020-06-25 NOTE — Progress Notes (Signed)
Called to room to assist during endoscopic procedure.  Patient ID and intended procedure confirmed with present staff. Received instructions for my participation in the procedure from the performing physician.  

## 2020-06-25 NOTE — Progress Notes (Signed)
Pt's states no medical or surgical changes since previsit or office visit. 

## 2020-06-26 ENCOUNTER — Telehealth: Payer: Self-pay

## 2020-06-26 NOTE — Telephone Encounter (Signed)
-----   Message from Milus Banister, MD sent at 06/26/2020  7:25 AM EDT ----- Regarding: RE: needs a refill Will do  Ivah Girardot, Can you please contact him and call him and refill his pantoprazole for 1 month with 11 refills.  Thank you  ----- Message ----- From: Saunders Revel, RN Sent: 06/25/2020   4:12 PM EDT To: Milus Banister, MD Subject: needs a refill                                 Mr. Goshorn needs a refill of his Protonix as soon as possible.  He is going out of town.  Thanks!  Alden Hipp, RN

## 2020-06-26 NOTE — Telephone Encounter (Signed)
Refill completed by Harrel Lemon CMA

## 2020-06-27 ENCOUNTER — Telehealth: Payer: Self-pay | Admitting: *Deleted

## 2020-06-27 NOTE — Telephone Encounter (Signed)
  Follow up Call-  Call back number 06/25/2020  Post procedure Call Back phone  # [phone]  Permission to leave phone message Yes  Some recent data might be hidden     Patient questions:  Do you have a fever, pain , or abdominal swelling? No. Pain Score  0 *  Have you tolerated food without any problems? Yes.    Have you been able to return to your normal activities? Yes.    Do you have any questions about your discharge instructions: Diet   No. Medications  No. Follow up visit  No.  Do you have questions or concerns about your Care? No.  Actions: * If pain score is 4 or above: No action needed, pain <4.  1. Have you developed a fever since your procedure? no  2.   Have you had an respiratory symptoms (SOB or cough) since your procedure? no  3.   Have you tested positive for COVID 19 since your procedure no  4.   Have you had any family members/close contacts diagnosed with the COVID 19 since your procedure?  no   If yes to any of these questions please route to Joylene Ryun, RN and Erenest Rasher, RN

## 2020-07-03 ENCOUNTER — Encounter: Payer: Self-pay | Admitting: Gastroenterology

## 2020-08-12 ENCOUNTER — Other Ambulatory Visit: Payer: Self-pay

## 2020-08-12 ENCOUNTER — Encounter: Payer: Self-pay | Admitting: Internal Medicine

## 2020-08-12 ENCOUNTER — Ambulatory Visit (INDEPENDENT_AMBULATORY_CARE_PROVIDER_SITE_OTHER): Payer: PPO | Admitting: Internal Medicine

## 2020-08-12 VITALS — BP 132/86 | HR 66 | Temp 98.4°F | Ht 71.0 in | Wt 196.0 lb

## 2020-08-12 DIAGNOSIS — R310 Gross hematuria: Secondary | ICD-10-CM | POA: Diagnosis not present

## 2020-08-12 DIAGNOSIS — R82998 Other abnormal findings in urine: Secondary | ICD-10-CM | POA: Diagnosis not present

## 2020-08-12 LAB — POCT URINALYSIS DIPSTICK
Glucose, UA: POSITIVE — AB
Leukocytes, UA: NEGATIVE
Protein, UA: POSITIVE — AB
Spec Grav, UA: 1.02 (ref 1.010–1.025)
Urobilinogen, UA: 0.2 E.U./dL
pH, UA: 6 (ref 5.0–8.0)

## 2020-08-12 NOTE — Progress Notes (Signed)
Subjective:  Patient ID: Russell Griffith, male    DOB: 24-Sep-1946  Age: 74 y.o. MRN: [id number]  CC: Hematuria  This visit occurred during the SARS-CoV-2 public health emergency.  Safety protocols were in place, including screening questions prior to the visit, additional usage of staff PPE, and extensive cleaning of exam room while observing appropriate contact time as indicated for disinfecting solutions.   NEW TO ME  HPI Russell Griffith presents for a several week history of gross, painless hematuria.  Outpatient Medications Prior to Visit  Medication Sig Dispense Refill  . acetaminophen (TYLENOL) 500 MG tablet Take 1,000 mg by mouth every 6 (six) hours as needed for moderate pain or headache.    Russell Griffith atorvastatin (LIPITOR) 20 MG tablet Take 1 tablet (20 mg total) by mouth every evening. 90 tablet 3  . Cholecalciferol (D3-1000) 25 MCG (1000 UT) capsule Take 1,000 Units by mouth daily with supper.     . NONFORMULARY OR COMPOUNDED ITEM Apply 1 application topically daily as needed (rash). Cetaphil + triamcinolone cream    . pantoprazole (PROTONIX) 40 MG tablet TAKE 1 TABLET ONCE DAILY. 30 tablet 11  . polyvinyl alcohol (LIQUIFILM TEARS) 1.4 % ophthalmic solution Place 1 drop into both eyes every 8 (eight) hours as needed for dry eyes.    . ramipril (ALTACE) 2.5 MG capsule TAKE (1) CAPSULE DAILY. 90 capsule 1  . XARELTO 20 MG TABS tablet TAKE 1 TABLET IN THE EVENING WITH SUPPER. 30 tablet 5   Facility-Administered Medications Prior to Visit  Medication Dose Route Frequency Provider Last Rate Last Admin  . 0.9 %  sodium chloride infusion  500 mL Intravenous Once Russell Banister, MD        ROS Review of Systems  Constitutional: Negative for appetite change, diaphoresis, fatigue and fever.  HENT: Negative.  Negative for sinus pain, sore throat and trouble swallowing.   Eyes: Negative for visual disturbance.  Respiratory: Negative for cough, chest tightness, shortness of breath  and wheezing.   Cardiovascular: Negative for chest pain, palpitations and leg swelling.  Gastrointestinal: Negative for abdominal pain, constipation, diarrhea and nausea.  Endocrine: Negative.   Genitourinary: Positive for hematuria. Negative for decreased urine volume, difficulty urinating, discharge, dysuria, flank pain, frequency, penile pain, penile swelling, scrotal swelling and testicular pain.  Musculoskeletal: Negative.  Negative for arthralgias, joint swelling and myalgias.  Skin: Negative for color change, pallor and rash.  Neurological: Negative.  Negative for dizziness, weakness, light-headedness and headaches.  Hematological: Negative for adenopathy. Does not bruise/bleed easily.  Psychiatric/Behavioral: Negative.     Objective:  BP 132/86   Pulse 66   Temp 98.4 F (36.9 C) (Oral)   Ht 5\' 11"  (1.803 m)   Wt 196 lb (88.9 kg)   SpO2 96%   BMI 27.34 kg/m   BP Readings from Last 3 Encounters:  08/12/20 132/86  06/25/20 (!) 142/76  05/22/20 124/73    Wt Readings from Last 3 Encounters:  08/12/20 196 lb (88.9 kg)  06/25/20 198 lb (89.8 kg)  05/22/20 198 lb 8 oz (90 kg)    Physical Exam Vitals reviewed.  HENT:     Nose: Nose normal.     Mouth/Throat:     Mouth: Mucous membranes are moist.  Eyes:     General: No scleral icterus.    Conjunctiva/sclera: Conjunctivae normal.  Cardiovascular:     Rate and Rhythm: Normal rate and regular rhythm.     Heart sounds: No murmur heard.  Pulmonary:     Effort: Pulmonary effort is normal.     Breath sounds: No stridor. No wheezing, rhonchi or rales.  Abdominal:     General: Abdomen is flat.     Palpations: There is no mass.     Tenderness: There is no abdominal tenderness. There is no guarding.     Hernia: There is no hernia in the left inguinal area or right inguinal area.  Genitourinary:    Pubic Area: No rash.      Penis: Normal.      Testes: Normal.        Right: Mass, tenderness or swelling not present.         Left: Mass, tenderness or swelling not present.     Epididymis:     Right: Normal. Not enlarged. No mass.     Left: Normal. Not inflamed or enlarged. No mass.     Prostate: Normal. Not enlarged, not tender and no nodules present.     Rectum: Guaiac result negative. External hemorrhoid and internal hemorrhoid present. No mass, tenderness or anal fissure. Normal anal tone.  Musculoskeletal:     Cervical back: Neck supple.  Lymphadenopathy:     Cervical: No cervical adenopathy.     Lower Body: No right inguinal adenopathy. No left inguinal adenopathy.  Neurological:     Mental Status: He is alert.     Lab Results  Component Value Date   WBC 6.1 02/29/2020   HGB 14.6 02/29/2020   HCT 43.0 02/29/2020   PLT 160.0 02/29/2020   GLUCOSE 98 02/13/2020   CHOL 111 08/23/2019   TRIG 46.0 08/23/2019   HDL 34.50 (L) 08/23/2019   LDLCALC 67 08/23/2019   ALT 32 08/18/2018   AST 29 08/18/2018   NA 141 02/13/2020   K 4.5 02/13/2020   CL 104 02/13/2020   CREATININE 1.14 02/13/2020   BUN 23 02/13/2020   CO2 28 02/13/2020   TSH 5.62 (H) 08/23/2019   PSA 0.96 08/23/2019   INR 1.53 (H) 07/15/2012   HGBA1C 5.8 07/30/2015    CT ANGIO NECK W OR WO CONTRAST  Result Date: 02/13/2020 CLINICAL DATA:  Pulsating mass in the right neck noted last week. Assess for aneurysm. EXAM: CT ANGIOGRAPHY NECK TECHNIQUE: Multidetector CT imaging of the neck was performed using the standard protocol during bolus administration of intravenous contrast. Multiplanar CT image reconstructions and MIPs were obtained to evaluate the vascular anatomy. Carotid stenosis measurements (when applicable) are obtained utilizing NASCET criteria, using the distal internal carotid diameter as the denominator. CONTRAST:  67mL OMNIPAQUE IOHEXOL 350 MG/ML SOLN COMPARISON:  None. FINDINGS: Aortic arch: Mild aortic atherosclerosis and tortuosity. No dissection. Branching pattern is normal without origin stenosis. Previous median sternotomy.  Pacemaker in place. Right carotid system: Common carotid artery shows minimal atherosclerotic plaque but no stenosis. The vessel takes a normal course to the bifurcation without significant tortuosity or any focal lesion such as aneurysm. The right carotid bifurcation shows minimal atherosclerotic plaque but no stenosis or irregularity. No sign of aneurysm. ICA does not show any laterally directed tortuosity. Left carotid system: Common carotid artery widely patent to the bifurcation. Carotid bifurcation shows mild soft and calcified plaque but no stenosis. Cervical ICA is mildly tortuous but patent. Vertebral arteries: Both vertebral arteries show atherosclerotic plaque at their origins. 30% stenosis suspected at each vertebral artery origin. Beyond that, the vessels are widely patent through the cervical region, through the foramen magnum to the basilar. Skeleton: Ordinary cervical spondylosis. Other  neck: No sign of mucosal or submucosal mass lesion. No thyroid mass. Patient does have a cluster of above limits of normal size lymph nodes in the supraclavicular region on the left, 5 or 6 nodes with short axis dimension ranging from 6 mm to 1 cm. See below. No abnormality seen in the region of the skin marker. No soft tissue mass or abnormal lymph node at that location. Right jugular vein is larger than the left but otherwise appears normal. Upper chest: Calcified granuloma in the posterior left upper lobe. No active process. IMPRESSION: No significant vascular finding. No aneurysm. No vessel tortuosity to explain the palpable impression in the right neck by physical exam. No soft tissue lesion in the area of concern. The right jugular vein is dominant but unremarkable. Minimal atherosclerotic plaque considering age, as outlined above. No clinically significant atherosclerotic disease . Cluster of lymph nodes in the left supraclavicular fossa, above normal limits in size for this region. There are 5 or 6 nodes with  short axis dimension region from 6 mm to 1 cm. There is no sign of primary mass lesion anywhere examined by this scan. Is there any inflammation associated with the pacemaker? Consider follow-up of these lymph nodes by physical exam, to ensure that there is not progressive enlargement. Electronically Signed   By: Russell Griffith M.D.   On: 02/13/2020 14:16    Assessment & Plan:   Nesbit was seen today for hematuria.  Diagnoses and all orders for this visit:  Dark urine -     POCT Urinalysis Dipstick -     Urine Culture  Gross hematuria- He has a several week history of painless hematuria and has 3+ RBCs on the urine.  The only other abnormal urinary finding is a trace of protein.  There are no white cells.  His examination is not consistent an infection.  Urine culture is pending.  I am concerned that he may have a bleeding source in the kidneys or bladder - I have ordered a CT of the abdomen and pelvis, renal protocol, with and without contrast.  If that is unremarkable then I will recommend that he see a urologist to consider undergoing cystoscopy. -     Urine Culture -     CT RENAL ABD W/WO; Future -     CULTURE, URINE COMPREHENSIVE; Future -     CULTURE, URINE COMPREHENSIVE   I am having Russell E. Uriostegui "Ed" maintain his polyvinyl alcohol, acetaminophen, Cholecalciferol, NONFORMULARY OR COMPOUNDED ITEM, atorvastatin, Xarelto, ramipril, and pantoprazole. We will continue to administer sodium chloride.  No orders of the defined types were placed in this encounter.    Follow-up: Return in about 3 weeks (around 09/02/2020).  Scarlette Calico, MD

## 2020-08-12 NOTE — Patient Instructions (Signed)
Hematuria, Adult Hematuria is blood in the urine. Blood may be visible in the urine, or it may be identified with a test. This condition can be caused by infections of the bladder, urethra, kidney, or prostate. Other possible causes include:  Kidney stones.  Cancer of the urinary tract.  Too much calcium in the urine.  Conditions that are passed from parent to child (inherited conditions).  Exercise that requires a lot of energy. Infections can usually be treated with medicine, and a kidney stone usually will pass through your urine. If neither of these is the cause of your hematuria, more tests may be needed to identify the cause of your symptoms. It is very important to tell your health care provider about any blood in your urine, even if it is painless or the blood stops without treatment. Blood in the urine, when it happens and then stops and then happens again, can be a symptom of a very serious condition, including cancer. There is no pain in the initial stages of many urinary cancers. Follow these instructions at home: Medicines  Take over-the-counter and prescription medicines only as told by your health care provider.  If you were prescribed an antibiotic medicine, take it as told by your health care provider. Do not stop taking the antibiotic even if you start to feel better. Eating and drinking  Drink enough fluid to keep your urine clear or pale yellow. It is recommended that you drink 3-4 quarts (2.8-3.8 L) a day. If you have been diagnosed with an infection, it is recommended that you drink cranberry juice in addition to large amounts of water.  Avoid caffeine, tea, and carbonated beverages. These tend to irritate the bladder.  Avoid alcohol because it may irritate the prostate (men). General instructions  If you have been diagnosed with a kidney stone, follow your health care provider's instructions about straining your urine to catch the stone.  Empty your bladder  often. Avoid holding urine for long periods of time.  If you are male: ? After a bowel movement, wipe from front to back and use each piece of toilet paper only once. ? Empty your bladder before and after sex.  Pay attention to any changes in your symptoms. Tell your health care provider about any changes or any new symptoms.  It is your responsibility to get your test results. Ask your health care provider, or the department performing the test, when your results will be ready.  Keep all follow-up visits as told by your health care provider. This is important. Contact a health care provider if:  You develop back pain.  You have a fever.  You have nausea or vomiting.  Your symptoms do not improve after 3 days.  Your symptoms get worse. Get help right away if:  You develop severe vomiting and are unable take medicine without vomiting.  You develop severe pain in your back or abdomen even though you are taking medicine.  You pass a large amount of blood in your urine.  You pass blood clots in your urine.  You feel very weak or like you might faint.  You faint. Summary  Hematuria is blood in the urine. It has many possible causes.  It is very important that you tell your health care provider about any blood in your urine, even if it is painless or the blood stops without treatment.  Take over-the-counter and prescription medicines only as told by your health care provider.  Drink enough fluid to keep   your urine clear or pale yellow. This information is not intended to replace advice given to you by your health care provider. Make sure you discuss any questions you have with your health care provider. Document Revised: 04/12/2019 Document Reviewed: 12/19/2016 Elsevier Patient Education  2020 [employer]  

## 2020-08-13 ENCOUNTER — Other Ambulatory Visit (INDEPENDENT_AMBULATORY_CARE_PROVIDER_SITE_OTHER): Payer: PPO

## 2020-08-13 ENCOUNTER — Telehealth: Payer: Self-pay

## 2020-08-13 DIAGNOSIS — R82998 Other abnormal findings in urine: Secondary | ICD-10-CM

## 2020-08-13 DIAGNOSIS — E785 Hyperlipidemia, unspecified: Secondary | ICD-10-CM

## 2020-08-13 DIAGNOSIS — R310 Gross hematuria: Secondary | ICD-10-CM

## 2020-08-13 LAB — BASIC METABOLIC PANEL
BUN: 19 mg/dL (ref 6–23)
CO2: 29 mEq/L (ref 19–32)
Calcium: 9.7 mg/dL (ref 8.4–10.5)
Chloride: 102 mEq/L (ref 96–112)
Creatinine, Ser: 1.05 mg/dL (ref 0.40–1.50)
GFR: 69.32 mL/min (ref >60.00)
Glucose, Bld: 72 mg/dL (ref 70–99)
Potassium: 3.8 mEq/L (ref 3.5–5.1)
Sodium: 139 mEq/L (ref 135–145)

## 2020-08-13 NOTE — Telephone Encounter (Signed)
I have added the BMET.

## 2020-08-13 NOTE — Telephone Encounter (Signed)
Labs ordered.

## 2020-08-13 NOTE — Telephone Encounter (Signed)
-----   Message from Octavio Manns sent at 08/13/2020  4:03 PM EDT ----- Can you please put in a BMET order for [place] lab for pt. And put them in as STAT.  He'll need it for his CT scan.  Thanks :) Cecille Rubin

## 2020-08-14 ENCOUNTER — Other Ambulatory Visit: Payer: Self-pay

## 2020-08-14 ENCOUNTER — Ambulatory Visit (INDEPENDENT_AMBULATORY_CARE_PROVIDER_SITE_OTHER)
Admission: RE | Admit: 2020-08-14 | Discharge: 2020-08-14 | Disposition: A | Discharge status: Home / Self Care | Payer: PPO | Source: Ambulatory Visit | Attending: Internal Medicine | Admitting: Internal Medicine

## 2020-08-14 DIAGNOSIS — N200 Calculus of kidney: Secondary | ICD-10-CM | POA: Diagnosis not present

## 2020-08-14 DIAGNOSIS — R310 Gross hematuria: Secondary | ICD-10-CM

## 2020-08-14 DIAGNOSIS — K5730 Diverticulosis of large intestine without perforation or abscess without bleeding: Secondary | ICD-10-CM | POA: Diagnosis not present

## 2020-08-14 DIAGNOSIS — R319 Hematuria, unspecified: Secondary | ICD-10-CM | POA: Diagnosis not present

## 2020-08-14 MED ORDER — IOHEXOL 300 MG/ML  SOLN
125.0000 mL | Freq: Once | INTRAMUSCULAR | Status: AC | PRN
  Administered 2020-08-14: 125 mL via INTRAVENOUS

## 2020-08-15 ENCOUNTER — Encounter: Payer: Self-pay | Admitting: Internal Medicine

## 2020-08-15 ENCOUNTER — Telehealth: Payer: Self-pay | Admitting: Internal Medicine

## 2020-08-15 ENCOUNTER — Other Ambulatory Visit: Payer: Self-pay | Admitting: Internal Medicine

## 2020-08-15 DIAGNOSIS — R310 Gross hematuria: Secondary | ICD-10-CM

## 2020-08-15 NOTE — Telephone Encounter (Signed)
    Patient requesting call from Dr Ronnald Ramp regarding CT results

## 2020-08-15 NOTE — Telephone Encounter (Signed)
I spoke to pt. He wanted to speak with Dr. Ronnald Ramp directly. I informed him that he was in clinic and unfortunately could not make a call to him personally. I suggested if he wanted a reply from Dr. Ronnald Ramp directly to send his concerns or follow up questions since he read the notations that was provided on his CT results. He expressed understanding and will do so.

## 2020-08-16 ENCOUNTER — Other Ambulatory Visit: Payer: Self-pay | Admitting: Internal Medicine

## 2020-08-16 DIAGNOSIS — N410 Acute prostatitis: Secondary | ICD-10-CM

## 2020-08-16 LAB — CULTURE, URINE COMPREHENSIVE

## 2020-08-16 MED ORDER — AMPICILLIN 500 MG PO CAPS: 500.0000 mg | ORAL_CAPSULE | Freq: Three times a day (TID) | ORAL | 0 refills | Status: DC

## 2020-08-17 ENCOUNTER — Other Ambulatory Visit: Payer: Self-pay

## 2020-08-17 ENCOUNTER — Emergency Department (HOSPITAL_COMMUNITY): Payer: PPO

## 2020-08-17 ENCOUNTER — Emergency Department (HOSPITAL_COMMUNITY)
Admission: EM | Admit: 2020-08-17 | Discharge: 2020-08-17 | Disposition: A | Discharge status: Home / Self Care | Payer: PPO | Attending: Emergency Medicine | Admitting: Emergency Medicine

## 2020-08-17 DIAGNOSIS — Z951 Presence of aortocoronary bypass graft: Secondary | ICD-10-CM | POA: Diagnosis not present

## 2020-08-17 DIAGNOSIS — N2889 Other specified disorders of kidney and ureter: Secondary | ICD-10-CM

## 2020-08-17 DIAGNOSIS — R109 Unspecified abdominal pain: Secondary | ICD-10-CM | POA: Diagnosis not present

## 2020-08-17 DIAGNOSIS — N3001 Acute cystitis with hematuria: Secondary | ICD-10-CM

## 2020-08-17 DIAGNOSIS — N139 Obstructive and reflux uropathy, unspecified: Secondary | ICD-10-CM | POA: Insufficient documentation

## 2020-08-17 DIAGNOSIS — Z79899 Other long term (current) drug therapy: Secondary | ICD-10-CM | POA: Insufficient documentation

## 2020-08-17 DIAGNOSIS — I10 Essential (primary) hypertension: Secondary | ICD-10-CM | POA: Diagnosis not present

## 2020-08-17 DIAGNOSIS — I4819 Other persistent atrial fibrillation: Secondary | ICD-10-CM | POA: Diagnosis not present

## 2020-08-17 DIAGNOSIS — Z7901 Long term (current) use of anticoagulants: Secondary | ICD-10-CM | POA: Diagnosis not present

## 2020-08-17 DIAGNOSIS — R1030 Lower abdominal pain, unspecified: Secondary | ICD-10-CM | POA: Diagnosis not present

## 2020-08-17 DIAGNOSIS — R102 Pelvic and perineal pain: Secondary | ICD-10-CM | POA: Diagnosis not present

## 2020-08-17 DIAGNOSIS — I2510 Atherosclerotic heart disease of native coronary artery without angina pectoris: Secondary | ICD-10-CM | POA: Diagnosis not present

## 2020-08-17 LAB — URINALYSIS, ROUTINE W REFLEX MICROSCOPIC
Bilirubin Urine: NEGATIVE
Glucose, UA: NEGATIVE mg/dL
Ketones, ur: 20 mg/dL — AB
Leukocytes,Ua: NEGATIVE
Nitrite: NEGATIVE
Protein, ur: 30 mg/dL — AB
RBC / HPF: >50 RBC/hpf — ABNORMAL HIGH (ref 0–5)
Specific Gravity, Urine: 1.019 (ref 1.005–1.030)
pH: 5 (ref 5.0–8.0)

## 2020-08-17 LAB — CBC WITH DIFFERENTIAL/PLATELET
Abs Immature Granulocytes: 0.02 10*3/uL (ref 0.00–0.07)
Basophils Absolute: 0 10*3/uL (ref 0.0–0.1)
Basophils Relative: 0 %
Eosinophils Absolute: 0 10*3/uL (ref 0.0–0.5)
Eosinophils Relative: 0 %
HCT: 44 % (ref 39.0–52.0)
Hemoglobin: 14.7 g/dL (ref 13.0–17.0)
Immature Granulocytes: 0 %
Lymphocytes Relative: 9 %
Lymphs Abs: 0.9 10*3/uL (ref 0.7–4.0)
MCH: 29.1 pg (ref 26.0–34.0)
MCHC: 33.4 g/dL (ref 30.0–36.0)
MCV: 87.1 fL (ref 80.0–100.0)
Monocytes Absolute: 1.6 10*3/uL — ABNORMAL HIGH (ref 0.1–1.0)
Monocytes Relative: 16 %
Neutro Abs: 7.7 10*3/uL (ref 1.7–7.7)
Neutrophils Relative %: 75 %
Platelets: 164 10*3/uL (ref 150–400)
RBC: 5.05 MIL/uL (ref 4.22–5.81)
RDW: 13.4 % (ref 11.5–15.5)
WBC: 10.3 10*3/uL (ref 4.0–10.5)
nRBC: 0 % (ref 0.0–0.2)

## 2020-08-17 LAB — COMPREHENSIVE METABOLIC PANEL
ALT: 49 U/L — ABNORMAL HIGH (ref 0–44)
AST: 39 U/L (ref 15–41)
Albumin: 4.4 g/dL (ref 3.5–5.0)
Alkaline Phosphatase: 90 U/L (ref 38–126)
Anion gap: 13 (ref 5–15)
BUN: 20 mg/dL (ref 8–23)
CO2: 25 mmol/L (ref 22–32)
Calcium: 9.6 mg/dL (ref 8.9–10.3)
Chloride: 99 mmol/L (ref 98–111)
Creatinine, Ser: 1.4 mg/dL — ABNORMAL HIGH (ref 0.61–1.24)
GFR calc Af Amer: 58 mL/min — ABNORMAL LOW (ref >60)
GFR calc non Af Amer: 50 mL/min — ABNORMAL LOW (ref >60)
Glucose, Bld: 120 mg/dL — ABNORMAL HIGH (ref 70–99)
Potassium: 3.9 mmol/L (ref 3.5–5.1)
Sodium: 137 mmol/L (ref 135–145)
Total Bilirubin: 3.8 mg/dL — ABNORMAL HIGH (ref 0.3–1.2)
Total Protein: 7.4 g/dL (ref 6.5–8.1)

## 2020-08-17 LAB — LIPASE, BLOOD: Lipase: 29 U/L (ref 11–51)

## 2020-08-17 MED ORDER — IOHEXOL 300 MG/ML  SOLN
100.0000 mL | Freq: Once | INTRAMUSCULAR | Status: AC | PRN
  Administered 2020-08-17: 100 mL via INTRAVENOUS

## 2020-08-17 MED ORDER — ONDANSETRON HCL 4 MG/2ML IJ SOLN
4.0000 mg | Freq: Once | INTRAMUSCULAR | Status: AC
  Administered 2020-08-17: 4 mg via INTRAVENOUS
  Filled 2020-08-17: qty 2

## 2020-08-17 MED ORDER — HYDROMORPHONE HCL 1 MG/ML IJ SOLN
0.5000 mg | Freq: Once | INTRAMUSCULAR | Status: AC
  Administered 2020-08-17: 0.5 mg via INTRAVENOUS
  Filled 2020-08-17: qty 1

## 2020-08-17 MED ORDER — SODIUM CHLORIDE 0.9 % IV BOLUS
1000.0000 mL | Freq: Once | INTRAVENOUS | Status: AC
  Administered 2020-08-17: 1000 mL via INTRAVENOUS

## 2020-08-17 MED ORDER — CIPROFLOXACIN IN D5W 400 MG/200ML IV SOLN
400.0000 mg | Freq: Once | INTRAVENOUS | Status: AC
  Administered 2020-08-17: 400 mg via INTRAVENOUS
  Filled 2020-08-17: qty 200

## 2020-08-17 MED ORDER — MORPHINE SULFATE (PF) 4 MG/ML IV SOLN
4.0000 mg | Freq: Once | INTRAVENOUS | Status: AC
  Administered 2020-08-17: 4 mg via INTRAVENOUS
  Filled 2020-08-17: qty 1

## 2020-08-17 MED ORDER — HYDROCODONE-ACETAMINOPHEN 5-325 MG PO TABS
1.0000 | ORAL_TABLET | Freq: Four times a day (QID) | ORAL | 0 refills | Status: AC | PRN
Start: 2020-08-17 — End: 2020-08-20

## 2020-08-17 NOTE — ED Notes (Signed)
Patient would like to hold off on antibiotics at this time.

## 2020-08-17 NOTE — ED Triage Notes (Signed)
Patient reports he had blood in urine one week ago, pcp set up appt with urology for next Monday., urine culture was sent, had ECOLI in it. Patient was started on antibiotic. Took three doses yesterday. Reports very severe lower right quadrant pain. Rated scale 10/10.

## 2020-08-17 NOTE — ED Provider Notes (Signed)
[facility] DEPT Provider Note   CSN: [id number] Arrival date & time: 08/17/20  3875     History No chief complaint on file.   Russell Griffith is a 74 y.o. male with PMHx HTN, HLD, GERD, CAD s/p CABG, BPH who presents to the ED today with complaint of gradual onset, constant, sharp, suprapubic abdominal pain that began around 1 AM this morning. Pt reports that about 10 days ago he noticed hematuria. He saw his PCP Dr. Ronnald Ramp who did a urine dipstick which did come back positive for blood; he sent the urine for culture. He also ordered a CT scan to assess for any cause of the bleeding.  IMPRESSION: 1. 3 mm nonobstructive calculus in the interpolar collecting system of left kidney. No ureteral stones or findings of urinary tract obstruction are noted at this time. 2. Mild colonic diverticulosis without evidence of acute diverticulitis at this time. 3. Morphologic changes in the liver suggestive of early cirrhosis. No suspicious hepatic lesions are noted at this time. 4. Multiple prominent borderline enlarged and mildly enlarged retroperitoneal and upper abdominal lymph nodes most evident in the region of the porta hepatis. This is nonspecific, and favored to be related to intrinsic liver disease. Attention on any future follow-up imaging is recommended to ensure the stability or resolution of these findings. 5. Aortic atherosclerosis. Pt reports that he received a call on Thursday (2 days ago) that his urine culture came back positive for E coli. He was prescribed ampicillin 500 mg TID for 21 days for "acute prostatitis with hematuria." Pt states that he took all 3 doses yesterday and then began having pain this AM which concerned him. He also had nausea and a couple of episodes of NBNB emesis. Pt had a regular BM yesterday; no blood or melena. Pt denies any fevers or chills. He has not taken anything for pain PTA. He states when he got to the ED today he  noticed his urine was very dark/cloudy. He reports that he has not noticed this before - mentions that he feels like the xarelto was causing the hematuria as he noticed that when he took his dose at nighttime the blood would be heavy and it would progressively decrease when the dose of his xarelto wore off.    The history is provided by the patient and medical records.       Past Medical History:  Diagnosis Date  . Aortic stenosis    Mild, echo, April, 2014  . BPH (benign prostatic hyperplasia)   . CAD (coronary artery disease)    a. s/p CABG  . Carotid artery disease ([place])   . Colonic polyp   . Diverticulosis   . Elevated bilirubin    Mild chronic elevation, 2.0 January, 2011 stable  . GERD (gastroesophageal reflux disease)    Barrett's esophagus  . HTN (hypertension)   . Hyperlipidemia    Low HDL  . 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)    Left  lung chest x-ray July, 2013  . Persistent atrial fibrillation ([place])    a. s/p PVI at [facility]  . Primary osteoarthritis of left knee    Mild  . Prolapsed internal hemorrhoids, grade 3 08/12/2015  . PVC's (premature ventricular contractions)   . Tubular adenoma of colon     Patient Active Problem List   Diagnosis Date Noted  . Acute prostatitis with hematuria 08/16/2020  . Gross hematuria 08/12/2020  . Bloating 05/22/2020  . Supraclavicular adenopathy 02/29/2020  . Abnormal cardiovascular  stress test 01/05/2018  . Left shoulder pain 11/12/2016  . Pain of left thumb 11/12/2016  . Abdominal pain, epigastric 10/29/2015  . Prolapsed internal hemorrhoids, grade 3 08/12/2015  . IT band syndrome 07/23/2015  . Partial tear of rotator cuff 12/04/2014  . Well adult exam 07/03/2014  . Jock itch 07/03/2014  . Dyslipidemia 03/06/2014  . Ejection fraction   . Diverticulitis of colon without hemorrhage 08/31/2013  . Mitral regurgitation   . Tricuspid regurgitation   . HX: anticoagulation   . Diffuse anterior T-wave inversions 10/24/2012  . PPM-[employer] 07/16/2012  . 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 Shortness of breath   . Aortic stenosis   . Plantar fasciitis of right foot 04/04/2012  . Numbness of toes   . CAD (coronary artery disease)   . Hx of CABG   . Drug therapy   . HTN (hypertension)   . Drug intolerance   . Pulmonic stenosis   . Aortic insufficiency   . Chronic anticoagulation   . Hypokalemia   . A-fib ([place])   . QT prolongation on Tikosyn 03/26/2011  . PVC's (premature ventricular contractions)   . Carotid artery disease ([place])   . Elevated bilirubin   . Abnormal TSH 04/01/2010  . DERANGEMENT OF MENISCUS NOT ELSEWHERE CLASSIFIED 12/03/2009  . Knee pain, right anterior 12/03/2009  . BENIGN PROSTATIC HYPERTROPHY, WITH OBSTRUCTION 09/30/2009  . Disorders of bursae and tendons in shoulder region, unspecified 08/22/2009  . 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  . ADENOMATOUS COLONIC POLYP 03/06/2008  . ANXIETY DEPRESSION 03/06/2008  . ESOPHAGEAL STENOSIS 03/06/2008  . BARRETTS ESOPHAGUS 03/06/2008  . GASTRITIS, CHRONIC 03/06/2008  . DUODENITIS 03/06/2008  . PROSTATITIS, HX OF 03/06/2008  . OSTEOARTHRITIS, KNEE, LEFT 11/04/2007  . FOOT PAIN, LEFT 11/04/2007  . Hx of colonic polyp 08/09/2007    Past Surgical History:  Procedure Laterality Date  . ABLATION     [facility] at [facility]  . ATRIAL FIBRILLATION ABLATION N/A 01/25/2019   Procedure: ATRIAL FIBRILLATION ABLATION;  Surgeon: [name], MD;  Location: [place] CV LAB;  Service: Cardiovascular;  Laterality: N/A;  . CARDIOVERSION N/A 10/14/2018   Procedure: CARDIOVERSION;  Surgeon: Fay Records, MD;  Location: [facility];  Service: Cardiovascular;  Laterality: N/A;  . CARDIOVERSION N/A 08/18/2019   Procedure: CARDIOVERSION;  Surgeon: Jerline Pain, MD;  Location: [facility] ENDOSCOPY;  Service: Cardiovascular;  Laterality: N/A;  . COLONOSCOPY    . CORONARY ARTERY BYPASS  GRAFT  2000   CABG X5  . ESOPHAGOGASTRODUODENOSCOPY    . HEMORRHOID BANDING    . INTRAVASCULAR PRESSURE WIRE/FFR STUDY N/A 01/05/2018   Procedure: INTRAVASCULAR PRESSURE WIRE/FFR STUDY;  Surgeon: Sherren Mocha, MD;  Location: [place] [facility] CV LAB;  Service: Cardiovascular;  Laterality: N/A;  . LEFT HEART CATH AND CORS/GRAFTS ANGIOGRAPHY N/A 01/05/2018   Procedure: LEFT HEART CATH AND CORS/GRAFTS ANGIOGRAPHY;  Surgeon: Sherren Mocha, MD;  Location: [place] CV LAB;  Service: Cardiovascular;  Laterality: N/A;  . PERMANENT PACEMAKER INSERTION N/A 07/15/2012   Procedure: PERMANENT PACEMAKER INSERTION;  Surgeon: Deboraha Sprang, MD;  Location: [facility] CATH LAB;  Service: Cardiovascular;  Laterality: N/A;  . rotator cuff surgery Right 2017  . TONSILLECTOMY  1956       Family History  Problem Relation Age of Onset  . Coronary artery disease Other 70  . Diabetes Other   . Multiple myeloma Mother   . Diabetes Father   .  Renal Disease Father   . Hyperlipidemia Other   . Hypertension Other   . Colon cancer Neg Hx   . Esophageal cancer Neg Hx   . Stomach cancer Neg Hx   . Rectal cancer Neg Hx     Social History   Tobacco Use  . Smoking status: Never Smoker  . Smokeless tobacco: Never Used  Vaping Use  . Vaping Use: Never used  Substance Use Topics  . Alcohol use: Yes    Alcohol/week: 21.0 standard drinks    Types: 21 Glasses of wine per week  . Drug use: No    Home Medications Prior to Admission medications   Medication Sig Start Date End Date Taking? Authorizing Provider  acetaminophen (TYLENOL) 500 MG tablet Take 1,000 mg by mouth every 6 (six) hours as needed for moderate pain or headache.   Yes [provider]  ampicillin (PRINCIPEN) 500 MG capsule Take 1 capsule (500 mg total) by mouth 3 (three) times daily for 21 days. 08/16/20 09/06/20 Yes Janith Lima, MD  atorvastatin (LIPITOR) 20 MG tablet Take 1 tablet (20 mg total) by mouth every evening. 09/27/19  Yes Allred,  Jeneen Rinks, MD  Cholecalciferol (D3-1000) 25 MCG (1000 UT) capsule Take 1,000 Units by mouth daily with supper.    Yes [provider]  NONFORMULARY OR COMPOUNDED ITEM Apply 1 application topically daily as needed (rash). Cetaphil + triamcinolone cream   Yes [provider]  pantoprazole (PROTONIX) 40 MG tablet TAKE 1 TABLET ONCE DAILY. Patient taking differently: Take 40 mg by mouth daily.  06/26/20  Yes Milus Banister, MD  polyvinyl alcohol (LIQUIFILM TEARS) 1.4 % ophthalmic solution Place 1 drop into both eyes every 8 (eight) hours as needed for dry eyes.   Yes [provider]  ramipril (ALTACE) 2.5 MG capsule TAKE (1) CAPSULE DAILY. Patient taking differently: Take 2.5 mg by mouth daily.  06/04/20  Yes Hochrein, Jeneen Rinks, MD  XARELTO 20 MG TABS tablet TAKE 1 TABLET IN THE EVENING WITH SUPPER. Patient taking differently: Take 20 mg by mouth daily with supper.  03/25/20  Yes Deboraha Sprang, MD  HYDROcodone-acetaminophen (NORCO) 5-325 MG tablet Take 1 tablet by mouth every 6 (six) hours as needed for up to 3 days for moderate pain. 08/17/20 08/20/20  Eustaquio Maize, PA-C    Allergies    Cayenne, Niacin and related, and Sulfonamide derivatives  Review of Systems   Review of Systems  Constitutional: Negative for chills and fever.  Respiratory: Negative for cough and shortness of breath.   Cardiovascular: Negative for chest pain.  Gastrointestinal: Positive for abdominal pain, nausea and vomiting. Negative for constipation and diarrhea.  Genitourinary: Positive for hematuria. Negative for difficulty urinating, flank pain and frequency.  All other systems reviewed and are negative.   Physical Exam Updated Vital Signs BP (!) 206/94 (BP Location: Right Arm) Comment: informed the nurse  Pulse 64   Temp 97.7 F (36.5 C) (Oral)   Resp 18   Ht '5\' 11"'  (1.803 m)   Wt 87.1 kg   SpO2 100%   BMI 26.78 kg/m   Physical Exam Vitals and nursing note reviewed.  Constitutional:       Appearance: He is not ill-appearing or diaphoretic.  HENT:     Head: Normocephalic and atraumatic.  Eyes:     Conjunctiva/sclera: Conjunctivae normal.  Cardiovascular:     Rate and Rhythm: Normal rate and regular rhythm.     Pulses: Normal pulses.  Pulmonary:  Effort: Pulmonary effort is normal.     Breath sounds: Normal breath sounds. No wheezing, rhonchi or rales.  Abdominal:     Palpations: Abdomen is soft.     Tenderness: There is abdominal tenderness. There is no right CVA tenderness, left CVA tenderness, guarding or rebound.     Comments: Soft, + suprapubic/RLQ abdominal TTP, +BS throughout, no r/g/r, neg murphy's, no CVA TTP  Musculoskeletal:     Cervical back: Neck supple.  Skin:    General: Skin is warm and dry.  Neurological:     Mental Status: He is alert.     ED Results / Procedures / Treatments   Labs (all labs ordered are listed, but only abnormal results are displayed) Labs Reviewed  COMPREHENSIVE METABOLIC PANEL - Abnormal; Notable for the following components:      Result Value   Glucose, Bld 120 (*)    Creatinine, Ser 1.40 (*)    ALT 49 (*)    Total Bilirubin 3.8 (*)    GFR calc non Af Amer 50 (*)    GFR calc Af Amer 58 (*)    All other components within normal limits  CBC WITH DIFFERENTIAL/PLATELET - Abnormal; Notable for the following components:   Monocytes Absolute 1.6 (*)    All other components within normal limits  URINALYSIS, ROUTINE W REFLEX MICROSCOPIC - Abnormal; Notable for the following components:   APPearance CLOUDY (*)    Hgb urine dipstick LARGE (*)    Ketones, ur 20 (*)    Protein, ur 30 (*)    RBC / HPF >50 (*)    Bacteria, UA RARE (*)    All other components within normal limits  LIPASE, BLOOD    EKG None  Radiology CT Abdomen Pelvis W Contrast  Result Date: 08/17/2020 CLINICAL DATA:  Right lower quadrant/suprapubic abdominal pain. EXAM: CT ABDOMEN AND PELVIS WITH CONTRAST TECHNIQUE: Multidetector CT imaging of the  abdomen and pelvis was performed using the standard protocol following bolus administration of intravenous contrast. CONTRAST:  166m OMNIPAQUE IOHEXOL 300 MG/ML  SOLN COMPARISON:  08/14/2020; 09/01/2013 FINDINGS: Lower chest: Limited visualization of the lower thorax demonstrates minimal dependent subpleural ground-glass atelectasis. No discrete focal airspace opacities. No pleural effusion. Cardiomegaly. Pacer leads terminate within the right atrium and ventricle. No pericardial effusion. Post median sternotomy with calcifications within native coronary arteries. Hepatobiliary: Normal hepatic contour. No discrete hepatic lesions. Normal appearance of the gallbladder given degree distention. No radiopaque gallstones. No intra or extrahepatic biliary ductal dilatation. No ascites. Pancreas: Normal appearance of the pancreas. Spleen: Borderline splenomegaly with the spleen measuring 13.6 cm in length. Adrenals/Urinary Tract: There is delayed enhancement and excretion of the right kidney in comparison to the left. This finding is associated with a minimal amount of right-sided ureterectasis and pelvicaliectasis, the etiology of which is not depicted on this examination. Specifically, no discrete obstructing ureteral stones. Moderate amount of asymmetric right-sided perinephric stranding. No definite right-sided renal stones on this postcontrast examination. Note is made of a solitary punctate (approximately 3 mm), nonobstructing stone within the interpolar aspect of the left kidney. No left-sided urinary obstruction. Normal appearance of the bilateral adrenal glands. Normal appearance of the urinary bladder given degree of distention. Stomach/Bowel: Scattered colonic diverticulosis without evidence of super imposed acute diverticulitis. Moderate colonic stool burden without evidence of enteric obstruction. Normal appearance of the terminal ileum and retrocecal appendix. No pneumoperitoneum, pneumatosis or portal  venous gas. Vascular/Lymphatic: Scattered porta hepatis and retroperitoneal lymphadenopathy with index porta hepatis lymph  node measuring 1.4 cm in greatest short axis diameter (image 32, series 2, index aortocaval lymph node measuring 1.0 cm (image 40, series 2, similar to the 08/2013 examination and favored to be reactive in etiology. Reproductive: Normal appearance the prostate gland. No free fluid the pelvic cul-de-sac. Other: There is a minimal amount of subcutaneous edema about the midline of the low back. Musculoskeletal: No acute or aggressive osseous abnormalities. Mild-to-moderate multilevel lumbar spine DDD, worse at L5-S1 with disc space height loss, endplate irregularity and sclerosis. Stigmata of dish within the lower thoracic spine. Moderate degenerative change the bilateral hips with joint space loss, subchondral sclerosis and osteophytosis. IMPRESSION: 1. Interval development of mild right-sided ureterectasis and pelvicaliectasis with associated delayed renal enhancement in excretion and asymmetric moderate right-sided perinephric stranding, the etiology of which is not depicted on this examination, new compared to recent abdominal CT performed 08/15/2020. Specifically, no definitive right-sided ureteral stones. 2. Colonic diverticulosis without evidence of superimposed acute diverticulitis. 3. Prominent porta hepatis and retroperitoneal lymph nodes appear similar to the 08/2013 examination and thus are presumably reactive in etiology. 4. Aortic Atherosclerosis (ICD10-I70.0). Electronically Signed   By: Sandi Mariscal M.D.   On: 08/17/2020 13:14   US Renal  Result Date: 08/17/2020 CLINICAL DATA:  74 year old male with suprapubic pain EXAM: RENAL / URINARY TRACT ULTRASOUND COMPLETE COMPARISON:  CT 08/14/2020 FINDINGS: Right Kidney: Length: 12.8 cm x 8.2 cm x 7.2 cm, 397 cc. No hydronephrosis. Flow confirmed in the hilum. No focal lesions. No perinephric fluid. Left Kidney: Length: 11.8 cm x 5.6 cm x  5.6 cm, 194 cc. No hydronephrosis. The stone identified on prior CT is not visualized on the current ultrasound study. Flow confirmed in the hilum. No focal lesions. No perinephric fluid. Bladder: Appears normal for degree of bladder distention. IMPRESSION: Negative for hydronephrosis. Electronically Signed   By: Corrie Mckusick D.O.   On: 08/17/2020 11:44    Procedures Procedures (including critical care time)  Medications Ordered in ED Medications  ciprofloxacin (CIPRO) IVPB 400 mg (400 mg Intravenous New Bag/Given 08/17/20 1601)  morphine 4 MG/ML injection 4 mg (4 mg Intravenous Given 08/17/20 0959)  sodium chloride 0.9 % bolus 1,000 mL (0 mLs Intravenous Stopped 08/17/20 1338)  HYDROmorphone (DILAUDID) injection 0.5 mg (0.5 mg Intravenous Given 08/17/20 1337)  iohexol (OMNIPAQUE) 300 MG/ML solution 100 mL (100 mLs Intravenous Contrast Given 08/17/20 1251)  morphine 4 MG/ML injection 4 mg (4 mg Intravenous Given 08/17/20 1555)  ondansetron (ZOFRAN) injection 4 mg (4 mg Intravenous Given 08/17/20 1555)    ED Course  I have reviewed the triage vital signs and the nursing notes.  Pertinent labs & imaging results that were available during my care of the patient were reviewed by me and considered in my medical decision making (see chart for details).    MDM Rules/Calculators/A&P                          74 year old male who presents to the ED today complaining of suprapubic abdominal pain that began around 1 AM this morning with associated nausea and vomiting. Currently being treated for acute prostatitis with hematuria with ampicillin 500 mg 3 times daily for 21 days, started dosage yesterday morning. Has taken a total of 3 tablets. Reports that he was having hematuria earlier last week prompting him to go see his PCP who ran a urine culture which was positive for E. coli. He had a CT scan done at that time  which showed an incidental stone in the lower pole of the kidney on the left side, no other  acute findings. Has a follow-up appointment with urology next week. Was not having pain until this morning prompting him to come to the ED. Has not taken anything for pain. On arrival patient is afebrile, nontachycardic nontachypneic. He appears to be in no acute distress. He has obvious suprapubic abdominal tenderness palpation on exam. No right lower quadrant/left lower quadrant/upper abdominal tenderness palpation. No CVA tenderness to palpation. Patient urinated in a specimen cup prior to being evaluated, I visualized the urine which is grossly brown and cloudy in nature. Will await urinalysis at this time. Patient may need to undergo repeat CT scan however will obtain labs including CBC, CMP, lipase, provide pain medication and then further evaluate.  CBC without leukocytosis. Hgb stable at 14.7.  CMP with creatinine 1.40; fluids provided. T bili elevated at 3.8 however does appear it has been elevated in the past. CT scan 3 days ago with findings concerning for early liver cirrhosis. No RUQ abd pain to suggest gallbladder etiology. 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   Lab Results  Component Value Date   CREATININE 1.40 (H) 08/17/2020   CREATININE 1.05 08/13/2020   CREATININE 1.14 02/13/2020   Lipase 29 U/A with large hgb, > 50 RBCs, 6-10 WBC, and rare bacteria. No leuks or nitrites.   Renal Ultrasound without acute findings today. No signs of hydro. Given pt's pain is in the suprapubic/RLQ will plan for repeat CT scan at this time to assess for appendicitis. Discussed with attending physician Dr. Sherry Ruffing who has evaluated patient as well and agrees with plan.   CT scan with what appears to be perinephrenic stranding on the right side. Have spoken with radiologist Dr. Pascal Lux for clarification regarding read - does appear his R kidney is obstructed but no obvious signs as to why at this time. No obvious stone appreciated. Will touch base with urology.   Discussed case with  Dr. Tresa Moore with urology who recommends continuing abx in the outpatient setting. Pt does not need admission given he is afebrile, nontachycardic, and without leukocytosis today. Recommends 400 mg IV Cipro since he is in the ED and then continuation of his ampicillin in the outpatient setting. Pt will need to follow up with urology.   Pt with hx of prolonged QT however QTc today 466. Cipro provided. Pt discharged at this time. He has an appointment with Dr. Gloriann Loan on Monday at 09:45 AM. Advised to keep. Strict return precautions discussed. Pt is in agreement with plan and stable for discharge home.   This note was prepared using Dragon voice recognition software and may include unintentional dictation errors due to the inherent limitations of voice recognition software.  Final Clinical Impression(s) / ED Diagnoses Final diagnoses:  Acute cystitis with hematuria  Obstruction of kidney    Rx / DC Orders ED Discharge Orders         Ordered    HYDROcodone-acetaminophen (NORCO) 5-325 MG tablet  Every 6 hours PRN        08/17/20 1620           Discharge Instructions     Please keep appointment with Dr. Gloriann Loan as scheduled for Monday at 09:45 AM.  Continue taking your oral antibiotics as prescribed. We have loaded you with a dose of IV antibiotics here in the ED today for a "boost."  Drink plenty of fluids to  stay hydrated.  Continue taking your other medications as prescribed.  Return to the ED for any worsening symptoms including worsening pain, fevers > 100.4, inability to urinate, persistent vomiting, or any other new/concerning symptoms       Eustaquio Maize, PA-C 08/17/20 1621    Tegeler, Gwenyth Allegra, MD 08/19/20 1051

## 2020-08-17 NOTE — Discharge Instructions (Addendum)
Please keep appointment with Dr. Gloriann Loan as scheduled for Monday at 09:45 AM.  Continue taking your oral antibiotics as prescribed. We have loaded you with a dose of IV antibiotics here in the ED today for a "boost."  Drink plenty of fluids to stay hydrated.  Continue taking your other medications as prescribed.  Return to the ED for any worsening symptoms including worsening pain, fevers > 100.4, inability to urinate, persistent vomiting, or any other new/concerning symptoms

## 2020-08-19 DIAGNOSIS — R310 Gross hematuria: Secondary | ICD-10-CM | POA: Diagnosis not present

## 2020-08-19 DIAGNOSIS — R1084 Generalized abdominal pain: Secondary | ICD-10-CM | POA: Diagnosis not present

## 2020-08-20 ENCOUNTER — Telehealth: Payer: Self-pay | Admitting: Internal Medicine

## 2020-08-20 NOTE — Telephone Encounter (Signed)
   [facility] Pre-operative Risk Assessment    HEARTCARE STAFF: - Please ensure there is not already an duplicate clearance open for this procedure. - Under Visit Info/Reason for Call, type in Other and utilize the format Clearance MM/DD/YY or Clearance TBD. Do not use dashes or single digits. - If request is for dental extraction, please clarify the # of teeth to be extracted.  Request for surgical clearance:  1. What type of surgery is being performed? Right ureteroscopy with stent placement, and possible Turbt  2. When is this surgery scheduled? TBD  3. What type of clearance is required (medical clearance vs. Pharmacy clearance to hold med vs. Both)? Both  4. Are there any medications that need to be held prior to surgery and how long?  Hold xarelto 72 hrs prior  5. Practice name and name of physician performing surgery? [facility], Dr, Link Snuffer  6. What is the office phone number? [phone]   7.   What is the office fax number? [phone]  8.   Anesthesia type (None, local, MAC, general) ? general   Russell Griffith 08/20/2020, 1:40 PM  _________________________________________________________________   (provider comments below)

## 2020-08-21 ENCOUNTER — Telehealth: Payer: Self-pay | Admitting: Cardiology

## 2020-08-21 NOTE — Telephone Encounter (Signed)
That is fine, or if he runs out of Xarelto before then, could switch when he is due for a refill instead. He qualifies for Eliquis 5mg  twice daily dosing. First dose of Eliquis should be given 24 hours after last Xarelto dose.

## 2020-08-21 NOTE — Telephone Encounter (Signed)
In clearing pt for his cysto he stated he would like to change from xarelto to Eliquis.  Could we do this when he is off xarelto for surgery and resume on eliquis?

## 2020-08-21 NOTE — Telephone Encounter (Signed)
Spoke with pt who reports he is scheduled to have a procedure possibly next week where he will stop his Xarelto.  Pt would like to start on Eliquis once he starts back on his blood thinner.  Requested pt contact office once he has date and information for procedure so Eliquis can be prescribed and sent to pharmacy at that time..  Pt verbalizes understanding and agrees with current plan.

## 2020-08-21 NOTE — Telephone Encounter (Signed)
Patient with diagnosis of afib on Xarelto for anticoagulation.    Procedure: Right ureteroscopy with stent placement, and possible Turbt Date of procedure: TBD  CHADS2-VASc score of 3 (age, HTN, CAD)  CrCl 23mL/min Platelet count 164K  Per office protocol, patient can hold Xarelto for 3 days prior to procedure per request.

## 2020-08-21 NOTE — Telephone Encounter (Signed)
Yes

## 2020-08-21 NOTE — Telephone Encounter (Signed)
   Primary Cardiologist: No primary care provider on file.  Chart reviewed as part of pre-operative protocol coverage. Patient was contacted 08/21/2020 in reference to pre-operative risk assessment for pending surgery as outlined below.  Russell Griffith was last seen on 02/12/20 by Dr. Caryl Comes for persistent atrial fib on xarelto, CAD with CABG in 50 and neg stress 2013 and no angina.  Since that day, Russell Griffith has done well meeting 4 METS of activity without angina.Russell Griffith  He may hold xarelto 72 hours prior to  Procedure and resume when safe post procedure.  We may change anticoagulation to eliquis but this would be post procedure.   Therefore, based on ACC/AHA guidelines, the patient would be at acceptable risk for the planned procedure without further cardiovascular testing.   The patient was advised that if he develops new symptoms prior to surgery to contact our office to arrange for a follow-up visit, and he verbalized understanding.  I will route this recommendation to the requesting party via Epic fax function and remove from pre-op pool. Please call with questions.  Cecilie Kicks, NP 08/21/2020, 10:51 AM

## 2020-08-21 NOTE — Telephone Encounter (Signed)
Pharm please address xarelto thanks 

## 2020-08-21 NOTE — Telephone Encounter (Signed)
Hi, I am sending back to you, pre-op only clears for surgery.  This was his question to Dr. Caryl Comes.  I believe he talked to him before. I was just relaying message.  Thanks.

## 2020-08-22 ENCOUNTER — Other Ambulatory Visit: Payer: PPO

## 2020-08-23 ENCOUNTER — Encounter: Payer: Self-pay | Admitting: Internal Medicine

## 2020-08-23 NOTE — Telephone Encounter (Signed)
Pt has agreed to start taking Eliquis. Please call in a rx to [place], [place]

## 2020-08-23 NOTE — Progress Notes (Unsigned)
Called in response to a text message regarding something he had read about increased risk of stroke in the context of UTI.  He wondered whether it was safe to come off of his anticoagulation for his anticipated cystoscopy procedure.  I told him I was unaware of this association likely related to inflammation but his annualized risk of stroke should be relatively low with a CHA2DS2-VASc score of 3 and hence the daily risk associated with holding of his anticoagulation should be very low.  We also discussed the transition from Xarelto to Eliquis and that it is rather simple to do just starting the Eliquis 24 hours after the last dose of Xarelto

## 2020-08-25 MED ORDER — APIXABAN 5 MG PO TABS: 5.0000 mg | ORAL_TABLET | Freq: Two times a day (BID) | ORAL | 3 refills | Status: DC

## 2020-08-25 NOTE — Telephone Encounter (Addendum)
Per Dr Caryl Comes pt may begin Eliquis 5mg  1 tablet bid #180 with 3RF.  Rx sent to pt's pharmacy as requested.

## 2020-08-25 NOTE — Addendum Note (Signed)
Addended by: Thora Lance on: 08/25/2020 04:33 PM   Modules accepted: Orders

## 2020-08-26 ENCOUNTER — Other Ambulatory Visit: Payer: Self-pay | Admitting: Urology

## 2020-08-28 ENCOUNTER — Other Ambulatory Visit: Payer: Self-pay | Admitting: Urology

## 2020-08-28 ENCOUNTER — Encounter: Payer: Self-pay | Admitting: Internal Medicine

## 2020-08-28 NOTE — Progress Notes (Addendum)
COVID Vaccine Completed: x2 Date COVID Vaccine completed: 12-17-19 & 01-07-20 COVID vaccine manufacturer: [employer]   PCP - Lew Dawes, MD Cardiologist - Virl Axe, MD   Clearance in chart dated 08-20-20 by Cecilie Kicks, NP  Chest x-ray - 03-05-20 in Epic EKG - 08-17-20 in Epic Stress Test - 02-23-18 in Epic ECHO - 11-11-18 in Epic Cardiac Cath - 01-05-18 in Epic Pacemaker/ICD device last checked:06-21-20 EP Study  - 01-25-19 in [place] - 01-19-19 in Epic  Sleep Study -  CPAP -   Fasting Blood Sugar -  Checks Blood Sugar _____ times a day  Blood Thinner Instructions: Eliquis 5 mg,  To stop 72 hours prior to surgery Aspirin Instructions: Last Dose:  Anesthesia review: CAD & hx of CABG, Afib, PVcs, aortic stenosis  Patient denies shortness of breath, fever, cough and chest pain at PAT appointment   Patient verbalized understanding of instructions that were given to them at the PAT appointment. Patient was also instructed that they will need to review over the PAT instructions again at home before surgery.

## 2020-08-28 NOTE — Patient Instructions (Addendum)
DUE TO COVID-19 ONLY ONE VISITOR IS ALLOWED TO COME WITH YOU AND STAY IN THE WAITING ROOM ONLY  DURING PRE OP AND PROCEDURE.   IF YOU WILL BE ADMITTED INTO THE HOSPITAL YOU ARE ALLOWED ONE SUPPORT PERSON DURING  VISITATION HOURS ONLY (10AM -8PM)    The support person may change daily.  The support person must pass our screening, gel in and out, and wear a mask at all times, including in the patients room.  Patients must also wear a mask when staff or their support person are in the room.   COVID SWAB TESTING MUST BE COMPLETED ON:  Thursday, 08-29-20 @ 2:25 PM   [address] [place], [place] [address]  (Must self quarantine after testing. Follow instructions on  handout.)        Your procedure is scheduled on: Monday, 09-02-20   Report to [facility] Main  Entrance   Report to Short Stay at 5:30 AM   [facility])    Call this number if you have problems the morning of surgery [phone]   Do not eat food :After Midnight.   May have liquids until 4:30 AM  day of surgery  CLEAR LIQUID DIET  Foods Allowed                                                                     Foods Excluded  Water, Black Coffee and tea, regular and decaf              liquids that you cannot  Plain Jell-O in any flavor  (No red)                                    see through such as: Fruit ices (not with fruit pulp)                                      milk, soups, orange juice              Iced Popsicles (No red)                                      All solid food                                   Apple juices Sports drinks like Gatorade (No red) Lightly seasoned clear broth or consume(fat free) Sugar, honey syrup  Oral Hygiene is also important to reduce your risk of infection.                                    Remember - BRUSH YOUR TEETH THE MORNING OF SURGERY WITH YOUR REGULAR TOOTHPASTE   Do NOT smoke after Midnight   Take these medicines the morning of surgery with A SIP OF  WATER:  Pantoprazole, Ampicillin  You may not have any metal on your body including jewelry, and body piercings             Do not wear lotions, powders, perfumes/cologne, or deodorant             Men may shave face and neck.   Do not bring valuables to the hospital. [place].   Contacts, dentures or bridgework may not be worn into surgery.   Patients discharged the day of surgery will not be allowed to drive home.                Please read over the following fact sheets you were given: IF YOU HAVE QUESTIONS ABOUT YOUR PRE OP  INSTRUCTIONS PLEASE CALL [phone]   [facility] - Preparing for Surgery Before surgery, you can play an important role.  Because skin is not sterile, your skin needs to be as free of germs as possible.  You can reduce the number of germs on your skin by washing with CHG (chlorahexidine gluconate) soap before surgery.  CHG is an antiseptic cleaner which kills germs and bonds with the skin to continue killing germs even after washing. Please DO NOT use if you have an allergy to CHG or antibacterial soaps.  If your skin becomes reddened/irritated stop using the CHG and inform your nurse when you arrive at Short Stay. Do not shave (including legs and underarms) for at least 48 hours prior to the first CHG shower.  You may shave your face/neck.  Please follow these instructions carefully:  1.  Shower with CHG Soap the night before surgery and the  morning of surgery.  2.  If you choose to wash your hair, wash your hair first as usual with your normal  shampoo.  3.  After you shampoo, rinse your hair and body thoroughly to remove the shampoo.                             4.  Use CHG as you would any other liquid soap.  You can apply chg directly to the skin and wash.  Gently with a scrungie or clean washcloth.  5.  Apply the CHG Soap to your body ONLY FROM THE NECK DOWN.   Do   not use on face/ open                            Wound or open sores. Avoid contact with eyes, ears mouth and   genitals (private parts).                       Wash face,  Genitals (private parts) with your normal soap.             6.  Wash thoroughly, paying special attention to the area where your    surgery  will be performed.  7.  Thoroughly rinse your body with warm water from the neck down.  8.  DO NOT shower/wash with your normal soap after using and rinsing off the CHG Soap.                9.  Pat yourself dry with a clean towel.            10.  Wear clean pajamas.  11.  Place clean sheets on your bed the night of your first shower and do not  sleep with pets. Day of Surgery : Do not apply any lotions/deodorants the morning of surgery.  Please wear clean clothes to the hospital/surgery center.  FAILURE TO FOLLOW THESE INSTRUCTIONS MAY RESULT IN THE CANCELLATION OF YOUR SURGERY  PATIENT SIGNATURE_________________________________  NURSE SIGNATURE__________________________________  ________________________________________________________________________

## 2020-08-28 NOTE — Progress Notes (Unsigned)
[place] DEVICE PROGRAMMING  Patient Information: Name:  Russell Griffith  DOB:  03-Jul-1946  MRN:  [id number]  {TIP - You do not have to delete this tip  -  Copy the info from the staff message sent by the PAT staff  then press F2 here and paste the information using CTL - V on the next line :[id number]}  Planned Procedure: Cystoscopy with ureteroscopy and stent placement. Possible TURBT  Surgeon: Link Snuffer  Date of Procedure: 09-02-20  Cautery will be used. -  Position during surgery: Supine   Please send documentation back to:  [facility] (Fax # [phone])    Device Information:  Clinic EP Physician:  Virl Axe, MD   Device Type:  Pacemaker Manufacturer and Phone #:  [employer]: [phone] Pacemaker Dependent?:  No. Date of Last Device Check:  06/21/20 Normal Device Function?:  No.  Electrophysiologist's Recommendations:   Have magnet available.  Provide continuous ECG monitoring when magnet is used or reprogramming is to be performed.   Procedure should not interfere with device function.  No device programming or magnet placement needed.  Per Device Clinic Standing Orders, Simone Curia, RN  12:57 PM 08/28/2020

## 2020-08-29 ENCOUNTER — Encounter (HOSPITAL_COMMUNITY)
Admission: RE | Admit: 2020-08-29 | Discharge: 2020-08-29 | Disposition: A | Discharge status: Home / Self Care | Payer: PPO | Source: Ambulatory Visit | Attending: Urology | Admitting: Urology

## 2020-08-29 ENCOUNTER — Other Ambulatory Visit: Payer: Self-pay

## 2020-08-29 ENCOUNTER — Encounter (HOSPITAL_COMMUNITY): Payer: Self-pay

## 2020-08-29 ENCOUNTER — Other Ambulatory Visit (HOSPITAL_COMMUNITY)
Admission: RE | Admit: 2020-08-29 | Discharge: 2020-08-29 | Disposition: A | Discharge status: Home / Self Care | Payer: PPO | Source: Ambulatory Visit | Attending: Urology | Admitting: Urology

## 2020-08-29 DIAGNOSIS — Z01812 Encounter for preprocedural laboratory examination: Secondary | ICD-10-CM | POA: Diagnosis not present

## 2020-08-29 DIAGNOSIS — Z20822 Contact with and (suspected) exposure to covid-19: Secondary | ICD-10-CM | POA: Diagnosis not present

## 2020-08-29 HISTORY — DX: Neoplasm of unspecified behavior of unspecified site: D49.9

## 2020-08-29 HISTORY — DX: Personal history of urinary calculi: Z87.442

## 2020-08-29 HISTORY — DX: Anxiety disorder, unspecified: F41.9

## 2020-08-29 LAB — CBC
HCT: 42.3 % (ref 39.0–52.0)
Hemoglobin: 14.1 g/dL (ref 13.0–17.0)
MCH: 29.4 pg (ref 26.0–34.0)
MCHC: 33.3 g/dL (ref 30.0–36.0)
MCV: 88.3 fL (ref 80.0–100.0)
Platelets: 306 10*3/uL (ref 150–400)
RBC: 4.79 MIL/uL (ref 4.22–5.81)
RDW: 13 % (ref 11.5–15.5)
WBC: 7.8 10*3/uL (ref 4.0–10.5)
nRBC: 0 % (ref 0.0–0.2)

## 2020-08-29 LAB — BASIC METABOLIC PANEL
Anion gap: 10 (ref 5–15)
BUN: 23 mg/dL (ref 8–23)
CO2: 23 mmol/L (ref 22–32)
Calcium: 9 mg/dL (ref 8.9–10.3)
Chloride: 106 mmol/L (ref 98–111)
Creatinine, Ser: 1.47 mg/dL — ABNORMAL HIGH (ref 0.61–1.24)
GFR calc Af Amer: 54 mL/min — ABNORMAL LOW (ref >60)
GFR calc non Af Amer: 47 mL/min — ABNORMAL LOW (ref >60)
Glucose, Bld: 104 mg/dL — ABNORMAL HIGH (ref 70–99)
Potassium: 4.4 mmol/L (ref 3.5–5.1)
Sodium: 139 mmol/L (ref 135–145)

## 2020-08-29 LAB — SARS CORONAVIRUS 2 (TAT 6-24 HRS): SARS Coronavirus 2: NEGATIVE

## 2020-08-30 ENCOUNTER — Encounter (HOSPITAL_COMMUNITY): Payer: Self-pay | Admitting: Anesthesiology

## 2020-08-30 ENCOUNTER — Encounter (HOSPITAL_COMMUNITY): Payer: Self-pay | Admitting: Physician Assistant

## 2020-08-30 NOTE — Anesthesia Preprocedure Evaluation (Deleted)
Anesthesia Evaluation  Patient identified by MRN, date of birth, ID band Patient awake    Reviewed: Allergy & Precautions, NPO status , Patient's Chart, lab work & pertinent test results  Airway Mallampati: I       Dental no notable dental hx.    Pulmonary    Pulmonary exam normal        Cardiovascular hypertension, Pt. on medications + Peripheral Vascular Disease  + pacemaker + Valvular Problems/Murmurs AS  Rhythm:Regular Rate:Normal     Neuro/Psych    GI/Hepatic Neg liver ROS, GERD  Medicated,  Endo/Other  negative endocrine ROS  Renal/GU Renal InsufficiencyRenal disease     Musculoskeletal   Abdominal Normal abdominal exam  (+)   Peds  Hematology negative hematology ROS (+)   Anesthesia Other Findings 2021 6:57 PM EDT   Device remote reviewed. Remote is normal. Battery status is good. Lead measurements unchanged. Histograms are appropriate.    MyChart Results Release  MyChart Status: Active Results Release Indications  Sinus node dysfunction (HCC) (I49.5 (ICD-10-CM)) Conclusion  Scheduled remote reviewed. Normal device function.   Known atrial flutter, on [place]. Device programmed AAIR. There were 5 NSVT that were atrial flutter with RVR.  Next remote 91 days.     Reproductive/Obstetrics                          Anesthesia Physical Anesthesia Plan  ASA: III  Anesthesia Plan: General   Post-op Pain Management:    Induction: Intravenous  PONV Risk Score and Plan: 3 and Ondansetron, Dexamethasone and Midazolam  Airway Management Planned: LMA  Additional Equipment: None  Intra-op Plan:   Post-operative Plan: Extubation in OR  Informed Consent: I have reviewed the patients History and Physical, chart, labs and discussed the procedure including the risks, benefits and alternatives for the proposed anesthesia with the patient or authorized representative who has  indicated his/her understanding and acceptance.     Dental advisory given  Plan Discussed with: CRNA  Anesthesia Plan Comments: (See PAT note 08/29/2020, Konrad Felix, PA-C)       Anesthesia Quick Evaluation

## 2020-08-30 NOTE — Progress Notes (Signed)
Anesthesia Chart Review   Case: [id number] Date/Time: 09/02/20 0715   Procedures:      CYSTOSCOPY WITH BILATERAL RETROGRADE PYELOGRAM, RIGHT URETEROSCOPY AND RIGHT  STENT PLACEMENT (Bilateral )     POSSIBLE TRANSURETHRAL RESECTION OF BLADDER TUMOR (TURBT) (N/A )   Anesthesia type: General   Pre-op diagnosis: GROSS HEMATURIA   Location: [facility] ROOM 02 / [facility]   Surgeons: Lucas Mallow, MD      DISCUSSION:74 y.o. never smoker with h/o GERD, HTN, A-fib (on Eliquis), CAD (CABG), mild AS, carotid artery disease, [employer] pacemaker in place (Device orders on chart and in Epic), gross hematuria scheduled for above procedure 09/02/2020 with Dr. Link Snuffer.    Per cardiology preoperative risk assessment 08/21/2020, "Chart reviewed as part of pre-operative protocol coverage. Patient was contacted 08/21/2020 in reference to pre-operative risk assessment for pending surgery as outlined below.  Russell Griffith was last seen on 02/12/20 by Dr. Caryl Comes for persistent atrial fib on xarelto, CAD with CABG in 89 and neg stress 2013 and no angina.  Since that day, Russell Griffith has done well meeting 4 METS of activity without angina.Russell Griffith He may hold xarelto 72 hours prior to  Procedure and resume when safe post procedure.  We may change anticoagulation to eliquis but this would be post procedure.  Therefore, based on ACC/AHA guidelines, the patient would be at acceptable risk for the planned procedure without further cardiovascular testing."  Anticipate pt can proceed with planned procedure barring acute status change.   VS: BP 135/71   Pulse 88   Temp 37.1 C (Oral)   Resp 14   Ht 5\' 11"  (1.803 m)   Wt 85.8 kg   SpO2 99%   BMI 26.39 kg/m   PROVIDERS: [name], Evie Lacks, MD  Virl Axe, MD is Cardiologist  LABS: Labs reviewed: Acceptable for surgery. (all labs ordered are listed, but only abnormal results are displayed)  Labs Reviewed  BASIC METABOLIC PANEL - Abnormal; Notable  for the following components:      Result Value   Glucose, Bld 104 (*)    Creatinine, Ser 1.47 (*)    GFR calc non Af Amer 47 (*)    GFR calc Af Amer 54 (*)    All other components within normal limits  CBC     IMAGES:   EKG: 08/17/2020 Rate 81 bpm  Right axis deviation  Borderline repolarization abnormality   CV: Echo 11/11/2018 Study Conclusions   - Left ventricle: The cavity size was normal. There was mild focal  basal hypertrophy of the septum. Systolic function was normal.  The estimated ejection fraction was in the range of 60% to 65%.  Wall motion was normal; there were no regional wall motion  abnormalities. The study is not technically sufficient to allow  evaluation of LV diastolic function.  - Aortic valve: Trileaflet; mildly thickened, mildly calcified  leaflets. There was mild stenosis. There was mild regurgitation.  Valve area (VTI): 1.47 cm^2.  - Mitral valve: There was mild regurgitation.  - Left atrium: The atrium was mildly dilated.  - Right ventricle: Pacer wire or catheter noted in right ventricle.  - Tricuspid valve: There was mild regurgitation.  - Pulmonary arteries: Systolic pressure was moderately increased.  PA peak pressure: 52 mm Hg (S).   Cardiac Cath 01/05/2018 1. Severe native 3 vessel CAD with total occlusion of the LAD and non-dominant RCA, mild left main stenosis, and moderate LCx stenosis 2. S/P CABG with patency  of the LIMA-LAD, SVG-diagonal, and SVG-left PDA 3. Occlusion of the SVG-acute marginal and SVG-OM2 4. Negative FFR of the native left main and LCx (0.94) 5. Normal LV systolic function with LVEF estimated at 55-60%  Recommend: medical therapy. No major areas of coronary ischemia identified  Past Medical History:  Diagnosis Date  . Anxiety   . Aortic stenosis    Mild, echo, April, 2014  . BPH (benign prostatic hyperplasia)   . CAD (coronary artery disease)    a. s/p CABG  . Carotid artery disease ([place])    . Colonic polyp   . Diverticulosis   . Elevated bilirubin    Mild chronic elevation, 2.0 January, 2011 stable  . GERD (gastroesophageal reflux disease)    Barrett's esophagus  . History of kidney stones   . HTN (hypertension)   . Hyperlipidemia    Low HDL  . 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 Persistent atrial fibrillation ([place])    a. s/p PVI at [facility]  . Precancerous lesion    Forehead  . Primary osteoarthritis of left knee    Mild  . Prolapsed internal hemorrhoids, grade 3 08/12/2015  . PVC's (premature ventricular contractions)   . Tubular adenoma of colon     Past Surgical History:  Procedure Laterality Date  . ABLATION     [facility]  . ATRIAL FIBRILLATION ABLATION N/A 01/25/2019   Procedure: ATRIAL FIBRILLATION ABLATION;  Surgeon: [name], MD;  Location: [place] [facility] LAB;  Service: Cardiovascular;  Laterality: N/A;  . CARDIOVERSION N/A 10/14/2018   Procedure: CARDIOVERSION;  Surgeon: Fay Records, MD;  Location: [place];  Service: Cardiovascular;  Laterality: N/A;  . CARDIOVERSION N/A 08/18/2019   Procedure: CARDIOVERSION;  Surgeon: Jerline Pain, MD;  Location[facility] ENDOSCOPY;  Service: Cardiovascular;  Laterality: N/A;  . COLONOSCOPY    . CORONARY ARTERY BYPASS GRAFT  2000   CABG X5  . ESOPHAGOGASTRODUODENOSCOPY    . HEAD & NECK SKIN LESION EXCISIONAL BIOPSY    . HEMORRHOID BANDING    . INTRAVASCULAR PRESSURE WIRE/FFR STUDY N/A 01/05/2018   Procedure: INTRAVASCULAR PRESSURE WIRE/FFR STUDY;  Surgeon: Sherren Mocha, MD;  Location: [place] [facility] LAB;  Service: Cardiovascular;  Laterality: N/A;  . LEFT HEART CATH AND CORS/GRAFTS ANGIOGRAPHY N/A 01/05/2018   Procedure: LEFT HEART CATH AND CORS/GRAFTS ANGIOGRAPHY;  Surgeon: Sherren Mocha, MD;  Location: [place] CV LAB;  Service: Cardiovascular;  Laterality: N/A;  . PERMANENT PACEMAKER INSERTION N/A 07/15/2012   Procedure: PERMANENT PACEMAKER INSERTION;  Surgeon: Deboraha Sprang,  MD;  Location: [facility] CATH LAB;  Service: Cardiovascular;  Laterality: N/A;  . rotator cuff surgery Right 2017  . TONSILLECTOMY  1956  . WISDOM TOOTH EXTRACTION      MEDICATIONS: . acetaminophen (TYLENOL) 500 MG tablet  . ampicillin (PRINCIPEN) 500 MG capsule  . apixaban (ELIQUIS) 5 MG TABS tablet  . atorvastatin (LIPITOR) 20 MG tablet  . Cholecalciferol (D3-1000) 25 MCG (1000 UT) capsule  . NONFORMULARY OR COMPOUNDED ITEM  . pantoprazole (PROTONIX) 40 MG tablet  . polyvinyl alcohol (LIQUIFILM TEARS) 1.4 % ophthalmic solution  . ramipril (ALTACE) 2.5 MG capsule   . 0.9 %  sodium chloride infusion    Konrad Felix, PA-C [facility] Pre-Surgical Testing [phone]

## 2020-09-02 ENCOUNTER — Ambulatory Visit (HOSPITAL_COMMUNITY)
Admission: RE | Admit: 2020-09-02 | Discharge: 2020-09-02 | Disposition: A | Discharge status: Home / Self Care | Payer: PPO | Attending: Urology | Admitting: Urology

## 2020-09-02 ENCOUNTER — Encounter (HOSPITAL_COMMUNITY)
Admission: RE | Disposition: A | Discharge status: Home / Self Care | Payer: Self-pay | Source: Home / Self Care | Attending: Urology

## 2020-09-02 ENCOUNTER — Other Ambulatory Visit: Payer: Self-pay | Admitting: Urology

## 2020-09-02 ENCOUNTER — Encounter: Payer: PPO | Admitting: Internal Medicine

## 2020-09-02 ENCOUNTER — Encounter (HOSPITAL_COMMUNITY): Payer: Self-pay | Admitting: Urology

## 2020-09-02 DIAGNOSIS — R310 Gross hematuria: Secondary | ICD-10-CM | POA: Insufficient documentation

## 2020-09-02 DIAGNOSIS — Z538 Procedure and treatment not carried out for other reasons: Secondary | ICD-10-CM | POA: Insufficient documentation

## 2020-09-02 SURGERY — CYSTOURETEROSCOPY, WITH RETROGRADE PYELOGRAM AND STENT INSERTION
Anesthesia: General

## 2020-09-02 MED ORDER — LACTATED RINGERS IV SOLN: INTRAVENOUS | Status: DC

## 2020-09-02 MED ORDER — ORAL CARE MOUTH RINSE: 15.0000 mL | Freq: Once | OROMUCOSAL | Status: AC

## 2020-09-02 MED ORDER — CEFAZOLIN SODIUM-DEXTROSE 2-4 GM/100ML-% IV SOLN
2.0000 g | INTRAVENOUS | Status: DC
  Filled 2020-09-02: qty 100

## 2020-09-02 MED ORDER — PROPOFOL 10 MG/ML IV BOLUS
INTRAVENOUS | Status: AC
  Filled 2020-09-02: qty 20

## 2020-09-02 MED ORDER — CHLORHEXIDINE GLUCONATE 0.12 % MT SOLN
15.0000 mL | Freq: Once | OROMUCOSAL | Status: AC
  Administered 2020-09-02: 15 mL via OROMUCOSAL

## 2020-09-02 MED ORDER — FENTANYL CITRATE (PF) 100 MCG/2ML IJ SOLN
INTRAMUSCULAR | Status: AC
  Filled 2020-09-02: qty 2

## 2020-09-02 NOTE — Progress Notes (Signed)
Patients wife was notified that the surgery was canceled because the MD was sick and not coming.  Wife was very upset, as she says patient has been waiting some time to have this procedure done. Apoligized on behalf of the MD and voiced understanding of her frustration.   Patient was advised to call the urology office about rescheduling and to ask whether or not to restart his eliquis today.  Pt knodded with understanding.  Patient visibly upset his surgery was canceled but he was understanding and calm with pre-op staff, he states "I know you all have no control over this, you are just the messenger".   Patient was d/c ambulatory to lobby to meet his wife.  Wife was in the lobby.

## 2020-09-02 NOTE — Progress Notes (Signed)
OR scheduled start time 0730. Called Dr Gloriann Loan [phone] "on way be here in a few minutes" Called [phone]  "he is sick- family sick". "Waiting for health at work, go ahead and cancel"  Pt advised to contact the office to reschedule.

## 2020-09-03 NOTE — Progress Notes (Signed)
Mr. Centola states he has remain in quarantine since COVID Swab 08/29/2020 and has remained off his Eliquis.

## 2020-09-04 ENCOUNTER — Ambulatory Visit (HOSPITAL_COMMUNITY): Payer: PPO

## 2020-09-04 ENCOUNTER — Encounter (HOSPITAL_COMMUNITY)
Admission: RE | Disposition: A | Discharge status: Home / Self Care | Payer: Self-pay | Source: Home / Self Care | Attending: Urology

## 2020-09-04 ENCOUNTER — Ambulatory Visit (HOSPITAL_COMMUNITY)
Admission: RE | Admit: 2020-09-04 | Discharge: 2020-09-04 | Disposition: A | Discharge status: Home / Self Care | Payer: PPO | Attending: Urology | Admitting: Urology

## 2020-09-04 ENCOUNTER — Ambulatory Visit (HOSPITAL_COMMUNITY): Payer: PPO | Admitting: Anesthesiology

## 2020-09-04 ENCOUNTER — Encounter (HOSPITAL_COMMUNITY): Payer: Self-pay | Admitting: Urology

## 2020-09-04 DIAGNOSIS — Z882 Allergy status to sulfonamides status: Secondary | ICD-10-CM | POA: Insufficient documentation

## 2020-09-04 DIAGNOSIS — E876 Hypokalemia: Secondary | ICD-10-CM | POA: Diagnosis not present

## 2020-09-04 DIAGNOSIS — Z951 Presence of aortocoronary bypass graft: Secondary | ICD-10-CM | POA: Insufficient documentation

## 2020-09-04 DIAGNOSIS — I2510 Atherosclerotic heart disease of native coronary artery without angina pectoris: Secondary | ICD-10-CM | POA: Insufficient documentation

## 2020-09-04 DIAGNOSIS — Z950 Presence of cardiac pacemaker: Secondary | ICD-10-CM | POA: Insufficient documentation

## 2020-09-04 DIAGNOSIS — I4891 Unspecified atrial fibrillation: Secondary | ICD-10-CM | POA: Insufficient documentation

## 2020-09-04 DIAGNOSIS — R310 Gross hematuria: Secondary | ICD-10-CM | POA: Insufficient documentation

## 2020-09-04 DIAGNOSIS — R1031 Right lower quadrant pain: Secondary | ICD-10-CM | POA: Diagnosis not present

## 2020-09-04 DIAGNOSIS — N1330 Unspecified hydronephrosis: Secondary | ICD-10-CM | POA: Insufficient documentation

## 2020-09-04 DIAGNOSIS — Z8249 Family history of ischemic heart disease and other diseases of the circulatory system: Secondary | ICD-10-CM | POA: Diagnosis not present

## 2020-09-04 DIAGNOSIS — I350 Nonrheumatic aortic (valve) stenosis: Secondary | ICD-10-CM | POA: Diagnosis not present

## 2020-09-04 DIAGNOSIS — J8410 Pulmonary fibrosis, unspecified: Secondary | ICD-10-CM | POA: Insufficient documentation

## 2020-09-04 DIAGNOSIS — Z87442 Personal history of urinary calculi: Secondary | ICD-10-CM | POA: Diagnosis not present

## 2020-09-04 DIAGNOSIS — N400 Enlarged prostate without lower urinary tract symptoms: Secondary | ICD-10-CM | POA: Diagnosis not present

## 2020-09-04 DIAGNOSIS — R896 Abnormal cytological findings in specimens from other organs, systems and tissues: Secondary | ICD-10-CM | POA: Diagnosis not present

## 2020-09-04 DIAGNOSIS — I493 Ventricular premature depolarization: Secondary | ICD-10-CM | POA: Diagnosis not present

## 2020-09-04 DIAGNOSIS — I10 Essential (primary) hypertension: Secondary | ICD-10-CM | POA: Diagnosis not present

## 2020-09-04 DIAGNOSIS — I4819 Other persistent atrial fibrillation: Secondary | ICD-10-CM | POA: Diagnosis not present

## 2020-09-04 DIAGNOSIS — R866 Abnormal cytological findings in specimens from male genital organs: Secondary | ICD-10-CM | POA: Diagnosis not present

## 2020-09-04 HISTORY — PX: CYSTOSCOPY WITH RETROGRADE PYELOGRAM, URETEROSCOPY AND STENT PLACEMENT: SHX5789

## 2020-09-04 SURGERY — CYSTOURETEROSCOPY, WITH RETROGRADE PYELOGRAM AND STENT INSERTION
Anesthesia: General | Site: Ureter | Laterality: Bilateral

## 2020-09-04 MED ORDER — PROPOFOL 10 MG/ML IV BOLUS
INTRAVENOUS | Status: AC
  Filled 2020-09-04: qty 20

## 2020-09-04 MED ORDER — MIDAZOLAM HCL 2 MG/2ML IJ SOLN
INTRAMUSCULAR | Status: AC
  Filled 2020-09-04: qty 2

## 2020-09-04 MED ORDER — FENTANYL CITRATE (PF) 100 MCG/2ML IJ SOLN
25.0000 ug | INTRAMUSCULAR | Status: DC | PRN
  Administered 2020-09-04: 50 ug via INTRAVENOUS

## 2020-09-04 MED ORDER — IOHEXOL 300 MG/ML  SOLN
INTRAMUSCULAR | Status: DC | PRN
  Administered 2020-09-04: 20 mL via URETHRAL

## 2020-09-04 MED ORDER — DEXAMETHASONE SODIUM PHOSPHATE 10 MG/ML IJ SOLN
INTRAMUSCULAR | Status: AC
  Filled 2020-09-04: qty 1

## 2020-09-04 MED ORDER — FENTANYL CITRATE (PF) 100 MCG/2ML IJ SOLN
INTRAMUSCULAR | Status: AC
  Filled 2020-09-04: qty 2

## 2020-09-04 MED ORDER — FENTANYL CITRATE (PF) 100 MCG/2ML IJ SOLN
INTRAMUSCULAR | Status: DC | PRN
  Administered 2020-09-04 (×2): 25 ug via INTRAVENOUS
  Administered 2020-09-04: 50 ug via INTRAVENOUS

## 2020-09-04 MED ORDER — CHLORHEXIDINE GLUCONATE 0.12 % MT SOLN
15.0000 mL | Freq: Once | OROMUCOSAL | Status: AC
  Administered 2020-09-04: 15 mL via OROMUCOSAL

## 2020-09-04 MED ORDER — ORAL CARE MOUTH RINSE: 15.0000 mL | Freq: Once | OROMUCOSAL | Status: AC

## 2020-09-04 MED ORDER — MEPERIDINE HCL 50 MG/ML IJ SOLN: 6.2500 mg | INTRAMUSCULAR | Status: DC | PRN

## 2020-09-04 MED ORDER — ACETAMINOPHEN 325 MG PO TABS: 325.0000 mg | ORAL_TABLET | ORAL | Status: DC | PRN

## 2020-09-04 MED ORDER — CEFAZOLIN SODIUM-DEXTROSE 2-4 GM/100ML-% IV SOLN
2.0000 g | INTRAVENOUS | Status: AC
  Administered 2020-09-04: 2 g via INTRAVENOUS
  Filled 2020-09-04: qty 100

## 2020-09-04 MED ORDER — LACTATED RINGERS IV SOLN: INTRAVENOUS | Status: DC

## 2020-09-04 MED ORDER — SODIUM CHLORIDE 0.9 % IR SOLN
Status: DC | PRN
  Administered 2020-09-04: 3000 mL

## 2020-09-04 MED ORDER — ACETAMINOPHEN 10 MG/ML IV SOLN: 1000.0000 mg | Freq: Once | INTRAVENOUS | Status: DC | PRN

## 2020-09-04 MED ORDER — PROPOFOL 10 MG/ML IV BOLUS
INTRAVENOUS | Status: DC | PRN
  Administered 2020-09-04: 170 mg via INTRAVENOUS

## 2020-09-04 MED ORDER — CEPHALEXIN 500 MG PO CAPS: 500.0000 mg | ORAL_CAPSULE | Freq: Two times a day (BID) | ORAL | 0 refills | Status: DC

## 2020-09-04 MED ORDER — MIDAZOLAM HCL 5 MG/5ML IJ SOLN
INTRAMUSCULAR | Status: DC | PRN
  Administered 2020-09-04 (×2): 1 mg via INTRAVENOUS

## 2020-09-04 MED ORDER — OXYCODONE HCL 5 MG/5ML PO SOLN: 5.0000 mg | Freq: Once | ORAL | Status: DC | PRN

## 2020-09-04 MED ORDER — OXYCODONE HCL 5 MG PO TABS: 5.0000 mg | ORAL_TABLET | Freq: Once | ORAL | Status: DC | PRN

## 2020-09-04 MED ORDER — ACETAMINOPHEN 160 MG/5ML PO SOLN: 325.0000 mg | ORAL | Status: DC | PRN

## 2020-09-04 MED ORDER — LIDOCAINE 2% (20 MG/ML) 5 ML SYRINGE
INTRAMUSCULAR | Status: AC
  Filled 2020-09-04: qty 5

## 2020-09-04 MED ORDER — ONDANSETRON HCL 4 MG/2ML IJ SOLN
INTRAMUSCULAR | Status: DC | PRN
  Administered 2020-09-04: 4 mg via INTRAVENOUS

## 2020-09-04 MED ORDER — LIDOCAINE HCL 1 % IJ SOLN
INTRAMUSCULAR | Status: DC | PRN
  Administered 2020-09-04: 60 mg via INTRADERMAL

## 2020-09-04 SURGICAL SUPPLY — 37 items
BAG DRN RND TRDRP ANRFLXCHMBR (UROLOGICAL SUPPLIES)
BAG URINE DRAIN 2000ML AR STRL (UROLOGICAL SUPPLIES) IMPLANT
BAG URO CATCHER STRL LF (MISCELLANEOUS) ×4 IMPLANT
CATH FOLEY 2WAY SLVR 30CC 24FR (CATHETERS) IMPLANT
CATH INTERMIT  6FR 70CM (CATHETERS) IMPLANT
CLOTH BEACON ORANGE TIMEOUT ST (SAFETY) ×4 IMPLANT
CNTNR URN SCR LID CUP LEK RST (MISCELLANEOUS) ×1 IMPLANT
CONT SPEC 4OZ STRL OR WHT (MISCELLANEOUS) ×4
ELECT REM PT RETURN 15FT ADLT (MISCELLANEOUS) ×4 IMPLANT
EVACUATOR MICROVAS BLADDER (UROLOGICAL SUPPLIES) IMPLANT
EXTRACTOR STONE NITINOL NGAGE (UROLOGICAL SUPPLIES) IMPLANT
GLOVE BIOGEL M 8.0 STRL (GLOVE) ×4 IMPLANT
GOWN STRL REUS W/TWL XL LVL3 (GOWN DISPOSABLE) ×4 IMPLANT
GUIDEWIRE ANG ZIPWIRE 038X150 (WIRE) ×3 IMPLANT
GUIDEWIRE STR DUAL SENSOR (WIRE) ×8 IMPLANT
IV NS 1000ML (IV SOLUTION) ×4
IV NS 1000ML BAXH (IV SOLUTION) ×2 IMPLANT
KIT TURNOVER KIT A (KITS) ×3 IMPLANT
LASER FIB FLEXIVA PULSE ID 365 (Laser) IMPLANT
LOOP MONOPOLAR YLW (ELECTROSURGICAL) IMPLANT
MANIFOLD NEPTUNE II (INSTRUMENTS) ×4 IMPLANT
NDL SAFETY ECLIPSE 18X1.5 (NEEDLE) ×2 IMPLANT
NEEDLE HYPO 18GX1.5 SHARP (NEEDLE) ×4
PACK CYSTO (CUSTOM PROCEDURE TRAY) ×4 IMPLANT
PENCIL SMOKE EVACUATOR (MISCELLANEOUS) IMPLANT
SHEATH URETERAL 12FRX28CM (UROLOGICAL SUPPLIES) IMPLANT
SHEATH URETERAL 12FRX35CM (MISCELLANEOUS) IMPLANT
SHEATH URETERAL 12FRX55CM (UROLOGICAL SUPPLIES) IMPLANT
STENT URET 6FRX26 CONTOUR (STENTS) ×3 IMPLANT
SYR TOOMEY IRRIG 70ML (MISCELLANEOUS)
SYRINGE TOOMEY IRRIG 70ML (MISCELLANEOUS) IMPLANT
TRACTIP FLEXIVA PULS ID 200XHI (Laser) IMPLANT
TRACTIP FLEXIVA PULSE ID 200 (Laser)
TUBING CONNECTING 10 (TUBING) ×3 IMPLANT
TUBING CONNECTING 10' (TUBING) ×1
TUBING UROLOGY SET (TUBING) ×4 IMPLANT
WATER STERILE IRR 3000ML UROMA (IV SOLUTION) ×4 IMPLANT

## 2020-09-04 NOTE — Interval H&P Note (Signed)
History and Physical Interval Note:  09/04/2020 11:28 AM  Russell Griffith  has presented today for surgery, with the diagnosis of GROSS HEMATURIA.  The various methods of treatment have been discussed with the patient and family. After consideration of risks, benefits and other options for treatment, the patient has consented to  Procedure(s): CYSTOSCOPY WITH BILATERAL RETROGRADE PYELOGRAM, RIGHT URETEROSCOPY AND RIGHT  STENT PLACEMENT (Bilateral) POSSIBLE TRANSURETHRAL RESECTION OF BLADDER TUMOR (TURBT) (N/A) as a surgical intervention.  The patient's history has been reviewed, patient examined, no change in status, stable for surgery.  I have reviewed the patient's chart and labs.  Questions were answered to the patient's satisfaction.     Lillette Boxer Binnie Vonderhaar

## 2020-09-04 NOTE — Anesthesia Procedure Notes (Signed)
Procedure Name: LMA Insertion Date/Time: 09/04/2020 12:33 PM Performed by: Garrel Ridgel, CRNA Pre-anesthesia Checklist: Patient identified, Emergency Drugs available, Suction available, Patient being monitored and Timeout performed Patient Re-evaluated:Patient Re-evaluated prior to induction Oxygen Delivery Method: Circle system utilized Preoxygenation: Pre-oxygenation with 100% oxygen Induction Type: IV induction Ventilation: Mask ventilation without difficulty LMA: LMA inserted LMA Size: 4.0 Number of attempts: 1 Placement Confirmation: positive ETCO2 Tube secured with: Tape Dental Injury: Teeth and Oropharynx as per pre-operative assessment

## 2020-09-04 NOTE — Anesthesia Preprocedure Evaluation (Signed)
Anesthesia Evaluation  Patient identified by MRN, date of birth, ID band Patient awake    Reviewed: Allergy & Precautions, NPO status , Patient's Chart, lab work & pertinent test results  Airway Mallampati: I       Dental no notable dental hx.    Pulmonary    Pulmonary exam normal        Cardiovascular hypertension, + CAD and + CABG  Normal cardiovascular exam     Neuro/Psych PSYCHIATRIC DISORDERS Anxiety Depression negative neurological ROS     GI/Hepatic Neg liver ROS, GERD  Medicated,  Endo/Other  negative endocrine ROS  Renal/GU negative Renal ROS  negative genitourinary   Musculoskeletal   Abdominal Normal abdominal exam  (+)   Peds  Hematology negative hematology ROS (+)   Anesthesia Other Findings   Reproductive/Obstetrics                             Anesthesia Physical Anesthesia Plan  ASA: III  Anesthesia Plan: General   Post-op Pain Management:    Induction:   PONV Risk Score and Plan: 4 or greater and Ondansetron and Dexamethasone  Airway Management Planned: Natural Airway and Simple Face Mask  Additional Equipment: None  Intra-op Plan:   Post-operative Plan:   Informed Consent: I have reviewed the patients History and Physical, chart, labs and discussed the procedure including the risks, benefits and alternatives for the proposed anesthesia with the patient or authorized representative who has indicated his/her understanding and acceptance.     Dental advisory given  Plan Discussed with: CRNA  Anesthesia Plan Comments:         Anesthesia Quick Evaluation

## 2020-09-04 NOTE — Anesthesia Postprocedure Evaluation (Signed)
Anesthesia Post Note  Patient: Javaris Wigington Brun  Procedure(s) Performed: CYSTOSCOPY WITH BILATERAL RETROGRADE PYELOGRAM, RIGHT URETEROSCOPY AND RIGHT  STENT PLACEMENT (Bilateral Ureter)     Patient location during evaluation: PACU Anesthesia Type: General Level of consciousness: awake and alert Pain management: pain level controlled Vital Signs Assessment: post-procedure vital signs reviewed and stable Respiratory status: spontaneous breathing, nonlabored ventilation, respiratory function stable and patient connected to nasal cannula oxygen Cardiovascular status: blood pressure returned to baseline and stable Postop Assessment: no apparent nausea or vomiting Anesthetic complications: no   No complications documented.  Last Vitals:  Vitals:   09/04/20 1345 09/04/20 1400  BP: (!) 163/65 (!) 149/73  Pulse: 70 (!) 55  Resp: 17 16  Temp:    SpO2: 97% 99%    Last Pain:  Vitals:   09/04/20 1345  TempSrc:   PainSc: 4                  Barnet Glasgow

## 2020-09-04 NOTE — H&P (Signed)
H&P  Chief Complaint: Blood in urine  History of Present Illness: 74 yo male presents for cysto, possible TURBT, RT RGP and possible Rt URS for evaluation of gross hematuria w/ Rt ureteral abnormality/mild hydro on CT A/P.  Past Medical History:  Diagnosis Date  . Anxiety   . Aortic stenosis    Mild, echo, April, 2014  . BPH (benign prostatic hyperplasia)   . CAD (coronary artery disease)    a. s/p CABG  . Carotid artery disease ([place])   . Colonic polyp   . Diverticulosis   . Elevated bilirubin    Mild chronic elevation, 2.0 January, 2011 stable  . GERD (gastroesophageal reflux disease)    Barrett's esophagus  . History of kidney stones   . HTN (hypertension)   . Hyperlipidemia    Low HDL  . 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 Persistent atrial fibrillation ([place])    a. s/p PVI at [facility]  . Precancerous lesion    Forehead  . Primary osteoarthritis of left knee    Mild  . Prolapsed internal hemorrhoids, grade 3 08/12/2015  . PVC's (premature ventricular contractions)   . Tubular adenoma of colon     Past Surgical History:  Procedure Laterality Date  . ABLATION     PVI at [facility]  . ATRIAL FIBRILLATION ABLATION N/A 01/25/2019   Procedure: ATRIAL FIBRILLATION ABLATION;  Surgeon: [name], MD;  Location: [place] [facility];  Service: Cardiovascular;  Laterality: N/A;  . CARDIOVERSION N/A 10/14/2018   Procedure: CARDIOVERSION;  Surgeon: Fay Records, MD;  Location: [place];  Service: Cardiovascular;  Laterality: N/A;  . CARDIOVERSION N/A 08/18/2019   Procedure: CARDIOVERSION;  Surgeon: Jerline Pain, MD;  Location[facility] ENDOSCOPY;  Service: Cardiovascular;  Laterality: N/A;  . COLONOSCOPY    . CORONARY ARTERY BYPASS GRAFT  2000   CABG X5  . ESOPHAGOGASTRODUODENOSCOPY    . HEAD & NECK SKIN LESION EXCISIONAL BIOPSY    . HEMORRHOID BANDING    . INTRAVASCULAR PRESSURE WIRE/FFR STUDY N/A 01/05/2018   Procedure: INTRAVASCULAR PRESSURE WIRE/FFR STUDY;   Surgeon: Sherren Mocha, MD;  Location: [place] [facility];  Service: Cardiovascular;  Laterality: N/A;  . LEFT HEART CATH AND CORS/GRAFTS ANGIOGRAPHY N/A 01/05/2018   Procedure: LEFT HEART CATH AND CORS/GRAFTS ANGIOGRAPHY;  Surgeon: Sherren Mocha, MD;  Location: [place] [facility];  Service: Cardiovascular;  Laterality: N/A;  . PERMANENT PACEMAKER INSERTION N/A 07/15/2012   Procedure: PERMANENT PACEMAKER INSERTION;  Surgeon: Deboraha Sprang, MD;  Location[facility] C[facility];  Service: Cardiovascular;  Laterality: N/A;  . rotator cuff surgery Right 2017  . TONSILLECTOMY  1956  . WISDOM TOOTH EXTRACTION      Home Medications:  Allergies as of 09/04/2020      Reactions   Cayenne Other (See Comments)   Sweats w/paprika too   Niacin And Related Other (See Comments)   Upset stomach   Sulfonamide Derivatives Other (See Comments)   "have no idea; mother told me I was allergic to"      Medication List    Notice   Cannot display discharge medications because the patient has not yet been admitted.     Allergies:  Allergies  Allergen Reactions  . Cayenne Other (See Comments)    Sweats w/paprika too  . Niacin And Related Other (See Comments)    Upset stomach  . Sulfonamide Derivatives Other (See Comments)    "have no idea; mother  told me I was allergic to"    Family History  Problem Relation Age of Onset  . Coronary artery disease Other 70  . Diabetes Other   . Multiple myeloma Mother   . Diabetes Father   . Renal Disease Father   . Hyperlipidemia Other   . Hypertension Other   . Colon cancer Neg Hx   . Esophageal cancer Neg Hx   . Stomach cancer Neg Hx   . Rectal cancer Neg Hx     Social History:  reports that he has never smoked. He has never used smokeless tobacco. He reports current alcohol use. He reports that he does not use drugs.  ROS: A complete review of systems was performed.  All systems are negative except for pertinent findings as noted.  Physical Exam:  Vital  signs in last 24 hours: There were no vitals taken for this visit. Constitutional:  Alert and oriented, No acute distress Cardiovascular: Regular rate  Respiratory: Normal respiratory effort. Lymphatic: No lymphadenopathy Neurologic: Grossly intact, no focal deficits Psychiatric: Normal mood and affect  Laboratory Data:  No results for input(s): WBC, HGB, HCT, PLT in the last 72 hours.  No results for input(s): NA, K, CL, GLUCOSE, BUN, CALCIUM, CREATININE in the last 72 hours.  Invalid input(s): CO3   No results found for this or any previous visit (from the past 24 hour(s)). Recent Results (from the past 240 hour(s))  SARS CORONAVIRUS 2 (TAT 6-24 HRS) Nasopharyngeal Nasopharyngeal Swab     Status: None   Collection Time: 08/29/20  2:08 PM   Specimen: Nasopharyngeal Swab  Result Value Ref Range Status   SARS Coronavirus 2 NEGATIVE NEGATIVE Final    Comment: (NOTE) SARS-CoV-2 target nucleic acids are NOT DETECTED.  The SARS-CoV-2 RNA is generally detectable in upper and lower respiratory specimens during the acute phase of infection. Negative results do not preclude SARS-CoV-2 infection, do not rule out co-infections with other pathogens, and should not be used as the sole basis for treatment or other patient management decisions. Negative results must be combined with clinical observations, patient history, and epidemiological information. The expected result is Negative.  Fact Sheet for Patients: SugarRoll.be  Fact Sheet for Healthcare Providers: https://www.woods-mathews.com/  This test is not yet approved or cleared by the [place] FDA and  has been authorized for detection and/or diagnosis of SARS-CoV-2 by FDA under an Emergency Use Authorization (EUA). This EUA will remain  in effect (meaning this test can be used) for the duration of the COVID-19 declaration under Se ction 564(b)(1) of the Act, 21 U.S.C. section  360bbb-3(b)(1), unless the authorization is terminated or revoked sooner.  Performed at [facility], [place] [address] [place], [place] [address]     Renal Function: Recent Labs    08/29/20 1338  CREATININE 1.47*   Estimated Creatinine Clearance: 48.4 mL/min (A) (by C-G formula based on SCr of 1.47 mg/dL (H)).  Radiologic Imaging: No results found.  Impression/Assessment:  Gross hematuria w/ recent CT A/P showing Rt hydro (mild) as well as Rt urteteral wall enhancement  Plan:  Cysto, Rt RGP, possible Rt URS/Bx and if BT present TURBT

## 2020-09-04 NOTE — Op Note (Signed)
Preoperative diagnosis: History of right flank pain, mild right hydroureteronephrosis, enhancement of right ureteral wall, hematuria, rule out bladder/ureteral lesion  Postop diagnosis: Normal bladder urothelium, mild narrowing of right distal ureter, possible ischemic change to right mid ureter, no evidence of urothelial lesion  Principal procedure: Cystoscopy, bilateral retrograde ureteral pyelograms, fluoroscopic interpretation, right ureteroscopy, placement of 6 French by 26 cm contour double-J stent with tether  Surgeon: Neytiri Asche  Anesthesia: General with LMA  Complications: None  Specimen: Washings from right ureter for cytology  Drains: 26 cm x 6 French contour double-J stent with tether  Estimated blood loss: None  Indications: 74 year old male with recent presentation of gross hematuria, right flank pain, with CT evidence of mild right hydronephrosis and enhancement of the urothelium of the right ureter.  He presents at this time for cystoscopy as well as retrograde pyelograms and possible ureteroscopy to rule out urothelial lesions.  I discussed the procedure with the patient in depth today.  He had initially been scheduled to have this done by one of my partners who had to call in sick the morning of the procedure.  The patient is aware of the risks, complications, and desires to proceed.  Description of procedure: The patient was properly identified in the holding area.  We went over the procedure, and he was shown CT scan images.  He was taken to the operating room where general anesthetic was administered with the LMA.  He was placed in the dorsolithotomy position.  IV antibiotics were administered.  Genitalia and perineum were prepped and draped, proper timeout performed.  55 French panendoscope was passed under direct vision to the urethra without lesions noted.  Prostate was minimally obstructed with bilobar hypertrophy.  Bladder was inspected circumferentially.  Ureteral  orifice ease were normal in location and configuration.  There were no urothelial lesions.  Vasculature on the posterior wall of the bladder was somewhat prominent, but no urothelial lesions were noted and I did not feel biopsy was necessary.  I then performed retrograde studies of the ureters using Omnipaque bilaterally with a 6 [place] open-ended catheter.  Left retrograde ureteropyelogram revealed normal ureter and pyelocalyceal system, without evidence of hydronephrosis, filling defect or stricture.  On the right, there was minimal dilatation of the proximal ureter, but about 3 cm up from the UVJ, there was a mild narrowed area.  I did not see filling defects in this area.  Pyelocalyceal system on the right was normal.  I did feel that ureteroscopic investigations should be performed.  Following the retrograde study, I used a 4-1/2 cm short semirigid ureteroscope.  This easily passed into the right ureter.  The ureter was investigated/examined up to the UPJ.  I saw no lesions.  However, the urothelium in the mid ureter and a couple of areas was somewhat brownish in comparison to the regular yellow/pink urothelium.  To me, this was not indicative of any neoplastic process, but I question whether some sort of ischemic process could have caused this.  There was no stricture and I was easily able to navigate the whole ureter with the scope.  Following initial investigation, I took washings of the affected area with saline.  These were sent for cytology labeled "ureteral washings".  At this point, I withdrew the ureteroscope.  Because of the patient's presentation as well as his instrumentation, I felt it necessary to place a stent.  The cystoscope was replaced in the bladder, guidewire was passed up into the upper pole calyces, and then I  placed a 6 [place] by 26 cm contour double-J stent with the tether left on.  Once the guidewire was removed, excellent proximal and distal curls were seen using fluoroscopy and  cystoscopy, respectively.  At this point the bladder was drained, the scope removed, and the patient was then awakened.  He was taken to the PACU in stable condition following fixation of the tether to the patient's penis.

## 2020-09-04 NOTE — Transfer of Care (Signed)
Immediate Anesthesia Transfer of Care Note  Patient: Russell Griffith  Procedure(s) Performed: CYSTOSCOPY WITH BILATERAL RETROGRADE PYELOGRAM, RIGHT URETEROSCOPY AND RIGHT  STENT PLACEMENT (Bilateral Ureter)  Patient Location: PACU  Anesthesia Type:General  Level of Consciousness: oriented, drowsy and patient cooperative  Airway & Oxygen Therapy: Patient Spontanous Breathing and Patient connected to face mask oxygen  Post-op Assessment: Report given to RN and Post -op Vital signs reviewed and stable  Post vital signs: Reviewed and stable  Last Vitals:  Vitals Value Taken Time  BP 154/75 09/04/20 1319  Temp    Pulse 43 09/04/20 1321  Resp 12 09/04/20 1321  SpO2 100 % 09/04/20 1321  Vitals shown include unvalidated device data.  Last Pain:  Vitals:   09/04/20 1018  TempSrc: Oral         Complications: No complications documented.

## 2020-09-04 NOTE — Discharge Instructions (Signed)
1. You may see some blood in the urine and may have some burning with urination for 48-72 hours. You also may notice that you have to urinate more frequently or urgently after your procedure which is normal.  2. You should call should you develop an inability urinate, fever > 101, persistent nausea and vomiting that prevents you from eating or drinking to stay hydrated.  3. If you have a stent, you will likely urinate more frequently and urgently until the stent is removed and you may experience some discomfort/pain in the lower abdomen and flank especially when urinating. You may take pain medication prescribed to you if needed for pain. You may also intermittently have blood in the urine until the stent is removed. 4.  It is okay to remove the stent on Monday morning       5.  If urine is fairly clear, you can start the Eliquis back on Thursday

## 2020-09-05 ENCOUNTER — Encounter (HOSPITAL_COMMUNITY): Payer: Self-pay | Admitting: Urology

## 2020-09-05 LAB — CYTOLOGY - NON PAP

## 2020-09-11 DIAGNOSIS — R310 Gross hematuria: Secondary | ICD-10-CM | POA: Diagnosis not present

## 2020-09-11 DIAGNOSIS — R1084 Generalized abdominal pain: Secondary | ICD-10-CM | POA: Diagnosis not present

## 2020-09-19 ENCOUNTER — Encounter: Payer: Self-pay | Admitting: Internal Medicine

## 2020-09-19 ENCOUNTER — Other Ambulatory Visit: Payer: Self-pay

## 2020-09-19 ENCOUNTER — Ambulatory Visit (INDEPENDENT_AMBULATORY_CARE_PROVIDER_SITE_OTHER): Payer: PPO | Admitting: Internal Medicine

## 2020-09-19 VITALS — BP 140/68 | HR 60 | Temp 97.9°F | Ht 71.0 in | Wt 187.8 lb

## 2020-09-19 DIAGNOSIS — I25810 Atherosclerosis of coronary artery bypass graft(s) without angina pectoris: Secondary | ICD-10-CM

## 2020-09-19 DIAGNOSIS — R591 Generalized enlarged lymph nodes: Secondary | ICD-10-CM

## 2020-09-19 DIAGNOSIS — Z23 Encounter for immunization: Secondary | ICD-10-CM

## 2020-09-19 DIAGNOSIS — R17 Unspecified jaundice: Secondary | ICD-10-CM

## 2020-09-19 DIAGNOSIS — I10 Essential (primary) hypertension: Secondary | ICD-10-CM | POA: Diagnosis not present

## 2020-09-19 DIAGNOSIS — Z0000 Encounter for general adult medical examination without abnormal findings: Secondary | ICD-10-CM

## 2020-09-19 DIAGNOSIS — R945 Abnormal results of liver function studies: Secondary | ICD-10-CM

## 2020-09-19 DIAGNOSIS — R310 Gross hematuria: Secondary | ICD-10-CM

## 2020-09-19 DIAGNOSIS — Z125 Encounter for screening for malignant neoplasm of prostate: Secondary | ICD-10-CM | POA: Diagnosis not present

## 2020-09-19 DIAGNOSIS — Z7901 Long term (current) use of anticoagulants: Secondary | ICD-10-CM | POA: Diagnosis not present

## 2020-09-19 DIAGNOSIS — R7989 Other specified abnormal findings of blood chemistry: Secondary | ICD-10-CM | POA: Insufficient documentation

## 2020-09-19 LAB — TSH: TSH: 4.11 u[IU]/mL (ref 0.35–4.50)

## 2020-09-19 LAB — URINALYSIS
Bilirubin Urine: NEGATIVE
Hgb urine dipstick: NEGATIVE
Ketones, ur: NEGATIVE
Leukocytes,Ua: NEGATIVE
Nitrite: NEGATIVE
Specific Gravity, Urine: 1.025 (ref 1.000–1.030)
Total Protein, Urine: NEGATIVE
Urine Glucose: NEGATIVE
Urobilinogen, UA: 0.2 (ref 0.0–1.0)
pH: 6 (ref 5.0–8.0)

## 2020-09-19 LAB — CBC WITH DIFFERENTIAL/PLATELET
Basophils Absolute: 0.1 10*3/uL (ref 0.0–0.1)
Basophils Relative: 1 % (ref 0.0–3.0)
Eosinophils Absolute: 0.1 10*3/uL (ref 0.0–0.7)
Eosinophils Relative: 2.3 % (ref 0.0–5.0)
HCT: 39.5 % (ref 39.0–52.0)
Hemoglobin: 13.5 g/dL (ref 13.0–17.0)
Lymphocytes Relative: 27 % (ref 12.0–46.0)
Lymphs Abs: 1.5 10*3/uL (ref 0.7–4.0)
MCHC: 34.3 g/dL (ref 30.0–36.0)
MCV: 85.2 fl (ref 78.0–100.0)
Monocytes Absolute: 0.9 10*3/uL (ref 0.1–1.0)
Monocytes Relative: 16 % — ABNORMAL HIGH (ref 3.0–12.0)
Neutro Abs: 3 10*3/uL (ref 1.4–7.7)
Neutrophils Relative %: 53.7 % (ref 43.0–77.0)
Platelets: 165 10*3/uL (ref 150.0–400.0)
RBC: 4.63 Mil/uL (ref 4.22–5.81)
RDW: 14.3 % (ref 11.5–15.5)
WBC: 5.6 10*3/uL (ref 4.0–10.5)

## 2020-09-19 LAB — LIPID PANEL
Cholesterol: 108 mg/dL (ref 0–200)
HDL: 34.2 mg/dL — ABNORMAL LOW (ref >39.00)
LDL Cholesterol: 63 mg/dL (ref 0–99)
NonHDL: 73.63
Total CHOL/HDL Ratio: 3
Triglycerides: 54 mg/dL (ref 0.0–149.0)
VLDL: 10.8 mg/dL (ref 0.0–40.0)

## 2020-09-19 LAB — PSA: PSA: 1.47 ng/mL (ref 0.10–4.00)

## 2020-09-19 NOTE — Assessment & Plan Note (Signed)
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 No suspicious hepatic lesions are noted at this

## 2020-09-19 NOTE — Progress Notes (Addendum)
Subjective:  Patient ID: Russell Griffith, male    DOB: [birth year]  Age: 74 y.o. 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 No suspicious hepatic lesions are noted at this   Outpatient Medications Prior to Visit  Medication Sig Dispense Refill  . acetaminophen (TYLENOL) 500 MG tablet Take 1,000 mg by mouth every 6 (six) hours as needed for moderate pain or headache.    Marland Tassin apixaban (ELIQUIS) 5 MG TABS tablet Take 1 tablet (5 mg total) by mouth 2 (two) times daily. 180 tablet 3  . atorvastatin (LIPITOR) 20 MG tablet Take 1 tablet (20 mg total) by mouth every evening. 90 tablet 3  . Cholecalciferol (D3-1000) 25 MCG (1000 UT) capsule Take 1,000 Units by mouth daily with supper.     . NONFORMULARY OR COMPOUNDED ITEM Apply 1 application topically daily as needed (rash). Cetaphil + triamcinolone cream    . pantoprazole (PROTONIX) 40 MG tablet TAKE 1 TABLET ONCE DAILY. (Patient taking differently: Take 40 mg by mouth daily. ) 30 tablet 11  . polyvinyl alcohol (LIQUIFILM TEARS) 1.4 % ophthalmic solution Place 1 drop into both eyes every 8 (eight) hours as needed for dry eyes.    . ramipril (ALTACE) 2.5 MG capsule TAKE (1) CAPSULE DAILY. (Patient taking differently: Take 2.5 mg by mouth daily. ) 90 capsule 1  . cephALEXin (KEFLEX) 500 MG capsule Take 1 capsule (500 mg total) by mouth 2 (two) times daily. 10 capsule 0   Facility-Administered Medications Prior to Visit  Medication Dose Route Frequency Provider Last Rate Last Admin  . 0.9 %  sodium chloride infusion  500 mL Intravenous Once Milus Banister, MD        ROS: Review of Systems  Constitutional: Negative for appetite change, fatigue and unexpected weight change.  HENT: Negative for congestion, nosebleeds,  sneezing, sore throat and trouble swallowing.   Eyes: Negative for itching and visual disturbance.  Respiratory: Negative for cough.   Cardiovascular: Negative for chest pain, palpitations and leg swelling.  Gastrointestinal: Negative for abdominal distention, blood in stool, diarrhea and nausea.  Genitourinary: Negative for frequency and hematuria.  Musculoskeletal: Negative for back pain, gait problem, joint swelling and neck pain.  Skin: Negative for rash.  Neurological: Negative for dizziness, tremors, speech difficulty and weakness.  Psychiatric/Behavioral: Negative for agitation, dysphoric mood and sleep disturbance. The patient is not nervous/anxious.     Objective:  BP 140/68 (BP Location: Right Arm, Patient Position: Sitting, Cuff Size: Large)   Pulse 60   Temp 97.9 F (36.6 C) (Oral)   Ht 5\' 11"  (1.803 m)   Wt 187 lb 12.8 oz (85.2 kg)   SpO2 97%   BMI 26.19 kg/m   BP Readings from Last 3 Encounters:  09/19/20 140/68  09/04/20 (!) 148/75  09/02/20 121/70    Wt Readings from Last 3 Encounters:  09/19/20 187 lb 12.8 oz (85.2 kg)  09/02/20 189 lb 3.2 oz (85.8 kg)  08/29/20 189 lb 3.2 oz (85.8 kg)    Physical Exam Constitutional:      General: He is not in acute distress.    Appearance: He is well-developed.     Comments: NAD  Eyes:     Conjunctiva/sclera: Conjunctivae normal.  Pupils: Pupils are equal, round, and reactive to light.  Neck:     Thyroid: No thyromegaly.     Vascular: No JVD.  Cardiovascular:     Rate and Rhythm: Normal rate. Rhythm irregular.     Heart sounds: Normal heart sounds. No murmur heard.  No friction rub. No gallop.   Pulmonary:     Effort: Pulmonary effort is normal. No respiratory distress.     Breath sounds: Normal breath sounds. No wheezing or rales.  Chest:     Chest wall: No tenderness.  Abdominal:     General: Bowel sounds are normal. There is no distension.     Palpations: Abdomen is soft. There is no mass.      Tenderness: There is no abdominal tenderness. There is no guarding or rebound.  Musculoskeletal:        General: No tenderness. Normal range of motion.     Cervical back: Normal range of motion.  Lymphadenopathy:     Cervical: No cervical adenopathy.  Skin:    General: Skin is warm and dry.     Findings: No rash.  Neurological:     Mental Status: He is alert and oriented to person, place, and time.     Cranial Nerves: No cranial nerve deficit.     Motor: No abnormal muscle tone.     Coordination: Coordination normal.     Gait: Gait normal.     Deep Tendon Reflexes: Reflexes are normal and symmetric.  Psychiatric:        Behavior: Behavior normal.        Thought Content: Thought content normal.        Judgment: Judgment normal.   No LNs enlarged in the axilla or groin areas Left and right supraclavicular fossa without adenopathy No cervical adenopathy  Lab Results  Component Value Date   WBC 7.8 08/29/2020   HGB 14.1 08/29/2020   HCT 42.3 08/29/2020   PLT 306 08/29/2020   GLUCOSE 104 (H) 08/29/2020   CHOL 111 08/23/2019   TRIG 46.0 08/23/2019   HDL 34.50 (L) 08/23/2019   LDLCALC 67 08/23/2019   ALT 49 (H) 08/17/2020   AST 39 08/17/2020   NA 139 08/29/2020   K 4.4 08/29/2020   CL 106 08/29/2020   CREATININE 1.47 (H) 08/29/2020   BUN 23 08/29/2020   CO2 23 08/29/2020   TSH 5.62 (H) 08/23/2019   PSA 0.96 08/23/2019   INR 1.53 (H) 07/15/2012   HGBA1C 5.8 07/30/2015    DG C-Arm 1-60 Min-No Report  Result Date: 09/04/2020 Fluoroscopy was utilized by the requesting physician.  No radiographic interpretation.    Assessment & Plan:    Walker Kehr, MD

## 2020-09-20 ENCOUNTER — Ambulatory Visit (INDEPENDENT_AMBULATORY_CARE_PROVIDER_SITE_OTHER): Payer: PPO

## 2020-09-20 DIAGNOSIS — I495 Sick sinus syndrome: Secondary | ICD-10-CM | POA: Diagnosis not present

## 2020-09-20 LAB — COMPREHENSIVE METABOLIC PANEL
ALT: 25 U/L (ref 0–53)
AST: 27 U/L (ref 0–37)
Albumin: 4.3 g/dL (ref 3.5–5.2)
Alkaline Phosphatase: 96 U/L (ref 39–117)
BUN: 20 mg/dL (ref 6–23)
CO2: 28 mEq/L (ref 19–32)
Calcium: 9.7 mg/dL (ref 8.4–10.5)
Chloride: 104 mEq/L (ref 96–112)
Creatinine, Ser: 1.21 mg/dL (ref 0.40–1.50)
GFR: 64 mL/min (ref >60.00)
Glucose, Bld: 90 mg/dL (ref 70–99)
Potassium: 4.4 mEq/L (ref 3.5–5.1)
Sodium: 141 mEq/L (ref 135–145)
Total Bilirubin: 2.7 mg/dL — ABNORMAL HIGH (ref 0.2–1.2)
Total Protein: 6.5 g/dL (ref 6.0–8.3)

## 2020-09-21 LAB — CUP PACEART REMOTE DEVICE CHECK
Battery Remaining Longevity: 36 mo
Battery Remaining Percentage: 38 %
Brady Statistic RA Percent Paced: 0 %
Brady Statistic RV Percent Paced: 0 %
Date Time Interrogation Session: 20211022013100
Implantable Lead Implant Date: 20130816
Implantable Lead Implant Date: 20130816
Implantable Lead Location: 753859
Implantable Lead Location: 753860
Implantable Lead Model: 5076
Implantable Lead Model: 5076
Implantable Pulse Generator Implant Date: 20130816
Lead Channel Impedance Value: 525 Ohm
Lead Channel Impedance Value: 917 Ohm
Lead Channel Pacing Threshold Amplitude: 0.9 V
Lead Channel Pacing Threshold Pulse Width: 0.4 ms
Lead Channel Setting Pacing Amplitude: 2 V
Lead Channel Setting Sensing Sensitivity: 2.5 mV
Pulse Gen Serial Number: 111255

## 2020-09-22 ENCOUNTER — Other Ambulatory Visit: Payer: Self-pay | Admitting: Internal Medicine

## 2020-09-23 DIAGNOSIS — R591 Generalized enlarged lymph nodes: Secondary | ICD-10-CM | POA: Insufficient documentation

## 2020-09-23 NOTE — Assessment & Plan Note (Signed)
On Eliquis.  Rate controlled

## 2020-09-23 NOTE — Assessment & Plan Note (Signed)
Resolved.  The symptoms were likely related to passing the stone.  Follow-up with Dr. Diona Fanti

## 2020-09-23 NOTE — Assessment & Plan Note (Signed)
On ramipril

## 2020-09-23 NOTE — Assessment & Plan Note (Signed)
On Eliquis, Lipitor

## 2020-09-23 NOTE — Assessment & Plan Note (Signed)
Normal physical exam.  Will watch CBC

## 2020-09-25 NOTE — Progress Notes (Signed)
Remote pacemaker transmission.   

## 2020-10-01 DIAGNOSIS — L853 Xerosis cutis: Secondary | ICD-10-CM | POA: Diagnosis not present

## 2020-10-01 DIAGNOSIS — Z85828 Personal history of other malignant neoplasm of skin: Secondary | ICD-10-CM | POA: Diagnosis not present

## 2020-10-01 DIAGNOSIS — L821 Other seborrheic keratosis: Secondary | ICD-10-CM | POA: Diagnosis not present

## 2020-10-01 DIAGNOSIS — L814 Other melanin hyperpigmentation: Secondary | ICD-10-CM | POA: Diagnosis not present

## 2020-10-01 DIAGNOSIS — L111 Transient acantholytic dermatosis [Grover]: Secondary | ICD-10-CM | POA: Diagnosis not present

## 2020-10-01 DIAGNOSIS — D1801 Hemangioma of skin and subcutaneous tissue: Secondary | ICD-10-CM | POA: Diagnosis not present

## 2020-10-02 NOTE — Progress Notes (Signed)
Cardiology Office Note Date:  10/02/2020  Patient ID:  Griffith, Russell [birth year], MRN [id number] PCP:  Cassandria Anger, MD  Cardiologist:  Dr. Percival Spanish Electrophysiologist: Dr. Caryl Comes  Chief Complaint:  Planned follow up  History of Present Illness: Carlito Bogert is a 74 y.o. male with history of CAD (CABG 2000), PAFib s/p PVI ablation at [facility] (he thinks about 2008), and an AFlutter ablation with Dr. Lovena Le 2003, sinus node dysfunction with PPM, HTN, HLD.  Dr. Caryl Comes has layed out his hx well: Because of chronotropic incompetence, we made more aggressive his rate response.  This was associated with significant improvement.  Had a history of exercise-induced polymorphic ventricular tachycardia, prompting  catheterization which demonstrated occlusion of 1 of his vein grafts but other conduits were open   His device was reprogrammed AAIR to prevent inappropriate ventricular pacing as we were seen R on T.  Since then he has done really quite well.  He has had scant palpitations.  With more symptoms atrial fibrillation he saw Dr. Greggory Brandy as opposed to [facility].  He underwent repeat ablation   Continues with significant AFib, assoc with fatigue and impaired exercise tolerance some palpitations.  I had on his behalf reached out to [facility] regarding repeat ablation.  He comes in today did consider repeat ablation and alternative strategies as well as consideration of having his ablation done here; there has been return of conduction on both left-sided veins and in the right carina.  He underwent repeat ablation and isolation of all 4 veins.  Notably there was no electrical activity on the roof.  For the left atrial ablation included a lesion along the posterior wall to form a standard box  A telehealth visit from 9/20 with Dr. Greggory Brandy is reviewed.  Recurrence of atrial fibrillation was noted.  Also severe right atrial enlargement was reported.  He underwent cardioversion; however, he reverted  to atrial flutter sometime thereafter.  Dr. Nikki Dom note also describes discussion of antiarrhythmic options including dronaderone, dofetilide and amiodarone if atrial arrhythmias were to recur.  Dronaderone was started at the A. fib clinic but intolerant because of nausea  Repeat telehealth visit 09/27/2019 by Dr. Greggory Brandy.  The patient was in atrial flutter but was unaware.  Heart rate was regular and exercise tolerance preserved.  Given his severe right atrial enlargement, he was not in favor of further ablation therapy.  Antiarrhythmic options were left open if symptoms were to recur.  He last saw Dr. Caryl Comes 02/12/20 He reported remaining fit without exertional intolerances or limitation The pt had noted pulsation of carotid artery and in d/w Dr. Alvester Chou planned for CTA >> this was done and without any significant vascular findings.   TODAY He continues to do well.  If anything perhaps a little less energetic, though suspects that is as he gets older. He continues to be very active, exercising vigorously on the elipitical with good exertional capacity. He will get a little winded with stairs and at peak exercise or working very hard on the eliptiacal.  No SOB otherwise No CP, palpitations or particular cardiac awareness No dizzy spells, near syncope or syncope.  He thinks he passed a kidney stone a couple months ago, had low pevic pain associated with hematuria, saw urology had CT scans, no tumors, cancers, stones noted. The pain stopped and so did the bleeding.   Labs/lipids are done with his PMD   AFIB history: Eliquis >> xarelto >> Eliquis Tikosyn 2012  Unclear when/why stopped, by  Dr. Ron Parker note in 2014 stopped post ablation, doing well. 2020, Multaq, intolerant with nausea  PVI ablation at  [facility] (+/- 2008) AFlutter ablation, 2003, Dr. Lovena Le Repeat PVI ablation w/Dr. Rayann Heman 01/25/2019 08/2019 discussed rate control strategy going forward given asymptomatic  DEVICE information: BSci dual  chamber PPM implanted 07/15/12, Dr. Lovena Le   Past Medical History:  Diagnosis Date  . Anxiety   . Aortic stenosis    Mild, echo, April, 2014  . BPH (benign prostatic hyperplasia)   . CAD (coronary artery disease)    a. s/p CABG  . Carotid artery disease ([place])   . Colonic polyp   . Diverticulosis   . Elevated bilirubin    Mild chronic elevation, 2.0 January, 2011 stable  . GERD (gastroesophageal reflux disease)    Barrett's esophagus  . History of kidney stones   . HTN (hypertension)   . Hyperlipidemia    Low HDL  . 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 Persistent atrial fibrillation ([place])    a. s/p PVI at [facility]  . Precancerous lesion    Forehead  . Primary osteoarthritis of left knee    Mild  . Prolapsed internal hemorrhoids, grade 3 08/12/2015  . PVC's (premature ventricular contractions)   . Tubular adenoma of colon     Past Surgical History:  Procedure Laterality Date  . ABLATION     PVI at [facility]  . ATRIAL FIBRILLATION ABLATION N/A 01/25/2019   Procedure: ATRIAL FIBRILLATION ABLATION;  Surgeon: [name], MD;  Location: [place] [facility];  Service: Cardiovascular;  Laterality: N/A;  . CARDIOVERSION N/A 10/14/2018   Procedure: CARDIOVERSION;  Surgeon: Fay Records, MD;  Location: [place];  Service: Cardiovascular;  Laterality: N/A;  . CARDIOVERSION N/A 08/18/2019   Procedure: CARDIOVERSION;  Surgeon: Jerline Pain, MD;  Location: [facility] ENDOSCOPY;  Service: Cardiovascular;  Laterality: N/A;  . COLONOSCOPY    . CORONARY ARTERY BYPASS GRAFT  2000   CABG X5  . CYSTOSCOPY WITH RETROGRADE PYELOGRAM, URETEROSCOPY AND STENT PLACEMENT Bilateral 09/04/2020   Procedure: CYSTOSCOPY WITH BILATERAL RETROGRADE PYELOGRAM, RIGHT URETEROSCOPY AND RIGHT  STENT PLACEMENT;  Surgeon: Franchot Gallo, MD;  Location: [facility] ORS;  Service: Urology;  Laterality: Bilateral;  . ESOPHAGOGASTRODUODENOSCOPY    . HEAD & NECK SKIN LESION EXCISIONAL BIOPSY    . HEMORRHOID  BANDING    . INTRAVASCULAR PRESSURE WIRE/FFR STUDY N/A 01/05/2018   Procedure: INTRAVASCULAR PRESSURE WIRE/FFR STUDY;  Surgeon: Sherren Mocha, MD;  Location: [place] [facility];  Service: Cardiovascular;  Laterality: N/A;  . LEFT HEART CATH AND CORS/GRAFTS ANGIOGRAPHY N/A 01/05/2018   Procedure: LEFT HEART CATH AND CORS/GRAFTS ANGIOGRAPHY;  Surgeon: Sherren Mocha, MD;  Location: [place] [facility];  Service: Cardiovascular;  Laterality: N/A;  . PERMANENT PACEMAKER INSERTION N/A 07/15/2012   Procedure: PERMANENT PACEMAKER INSERTION;  Surgeon: Deboraha Sprang, MD;  Location: [facility] CATH LAB;  Service: Cardiovascular;  Laterality: N/A;  . rotator cuff surgery Right 2017  . TONSILLECTOMY  1956  . WISDOM TOOTH EXTRACTION      Current Outpatient Medications  Medication Sig Dispense Refill  . acetaminophen (TYLENOL) 500 MG tablet Take 1,000 mg by mouth every 6 (six) hours as needed for moderate pain or headache.    Marland Welcher apixaban (ELIQUIS) 5 MG TABS tablet Take 1 tablet (5 mg total) by mouth 2 (two) times daily. 180 tablet 3  . atorvastatin (LIPITOR) 20 MG tablet Take 1 tablet (20 mg total) by  mouth daily at 6 PM. 90 tablet 1  . Cholecalciferol (D3-1000) 25 MCG (1000 UT) capsule Take 1,000 Units by mouth daily with supper.     . NONFORMULARY OR COMPOUNDED ITEM Apply 1 application topically daily as needed (rash). Cetaphil + triamcinolone cream    . pantoprazole (PROTONIX) 40 MG tablet TAKE 1 TABLET ONCE DAILY. (Patient taking differently: Take 40 mg by mouth daily. ) 30 tablet 11  . polyvinyl alcohol (LIQUIFILM TEARS) 1.4 % ophthalmic solution Place 1 drop into both eyes every 8 (eight) hours as needed for dry eyes.    . ramipril (ALTACE) 2.5 MG capsule TAKE (1) CAPSULE DAILY. (Patient taking differently: Take 2.5 mg by mouth daily. ) 90 capsule 1   Current Facility-Administered Medications  Medication Dose Route Frequency Provider Last Rate Last Admin  . 0.9 %  sodium chloride infusion  500 mL Intravenous  Once Milus Banister, MD        Allergies:   Gretta Arab, Niacin and related, and Sulfonamide derivatives   Social History:  The patient  reports that he has never smoked. He has never used smokeless tobacco. He reports current alcohol use. He reports that he does not use drugs.   Family History:  The patient's family history includes Coronary artery disease (age of onset: 13) in an other family member; Diabetes in his father and another family member; Hyperlipidemia in an other family member; Hypertension in an other family member; Multiple myeloma in his mother; Renal Disease in his father.  ROS:  Please see the history of present illness.    All other systems are reviewed and otherwise negative.   PHYSICAL EXAM:  VS:  There were no vitals taken for this visit. BMI: There is no height or weight on file to calculate BMI. Well nourished, well developed, in no acute distress  HEENT: normocephalic, atraumatic  Neck: no JVD, carotid bruits or masses Cardiac:  irreg-irreg; no significant murmurs, no rubs, or gallops Lungs:  CTA b/l,  no wheezing, rhonchi or rales  Abd: soft, nontender, MS: no deformity or atrophy Ext: no edema  Skin: warm and dry, no rash Neuro:  No gross deficits appreciated Psych: euthymic mood, full affect  PPM site is stable, no tethering or discomfort   EKG:  Done today and reviewed by myself shows  Personally reviewed, 08/17/20 AFib 81bpm   PPM iterrogation today done today and reviewed by myself Battery and lead measurements are stable RV lead threshold is 2.4V/0.7 (prior 2.0V) He is programmed AAI Based on A rates likely has been in AFib/flutter persistently V rates appear well controllde   01/25/2019: EPS/ablation CONCLUSIONS: 1. Atrial fibrillation upon presentation.   2. Intracardiac echo reveals a moderately enlarged left atrium with four separate pulmonary veins without evidence of pulmonary vein stenosis. 3. Return of electrical activity along the  anterior portion of the left superior and inferior pulmonary veins.  There was also electrical activity within the carina region of the right pulmonary veins.  There was no electrical activity along the roof of the left atrium as well as a paucity of electrograms elsewhere. 4. Successful electrical isolation of all four pulmonary veins with radiofrequency current.  A WACA approach was used 5. Additional left atrial ablation was performed with a standard box lesion created along the posterior wall of the left atrium 6. Atrial fibrillation successfully cardioverted to sinus rhythm. 7. CTI block persists from a prior ablation procedure 8.  No early apparent complications.     11/11/2018: TTE  Study Conclusions  - Left ventricle: The cavity size was normal. There was mild focal  basal hypertrophy of the septum. Systolic function was normal.  The estimated ejection fraction was in the range of 60% to 65%.  Wall motion was normal; there were no regional wall motion  abnormalities. The study is not technically sufficient to allow  evaluation of LV diastolic function.  - Aortic valve: Trileaflet; mildly thickened, mildly calcified  leaflets. There was mild stenosis. There was mild regurgitation.  Valve area (VTI): 1.47 cm^2.  - Mitral valve: There was mild regurgitation.  - Left atrium: The atrium was mildly dilated.  - Right ventricle: Pacer wire or catheter noted in right ventricle.  - Tricuspid valve: There was mild regurgitation.  - Pulmonary arteries: Systolic pressure was moderately increased.  PA peak pressure: 52 mm Hg (S).    01/25/2018; ETT The patient exercised following the Bruce protocol.   The patient reported no symptoms during the stress test. The patient experienced no angina during the stress test.  Heart rate demonstrated a normal response to exercise. Blood pressure demonstrated a hypertensive response to exercise. Overall, the patient's exercise capacity was  normal.   No inducible arrhythmias   01/05/2018; LHC 1. Severe native 3 vessel CAD with total occlusion of the LAD and non-dominant RCA, mild left main stenosis, and moderate LCx stenosis 2. S/P CABG with patency of the LIMA-LAD, SVG-diagonal, and SVG-left PDA 3. Occlusion of the SVG-acute marginal and SVG-OM2 4. Negative FFR of the native left main and LCx (0.94) 5. Normal LV systolic function with LVEF estimated at 55-60%  Recommend: medical therapy. No major areas of coronary ischemia identified    01/23/13: Echocardiogram Study Conclusions - Left ventricle: Septal hypokinesis The cavity size was mildly dilated. Systolic function was normal. The estimated ejection fraction was in the range of 50% to 55%. - Aortic valve: Small gradient across calcified AV - Mitral valve: Mild regurgitation. - Left atrium: The atrium was moderately dilated. - Right atrium: The atrium was moderately dilated. - Tricuspid valve: Mild-moderate regurgitation.  05/22/15: Carotid US Heterogeneous plaque, bilaterally. 1-39% RICA stenosis. Stable 54-62% LICA stenosis. Patent vertebral arteries with antegrade flow. Normal subclavian arteries, bilaterally.  12/03/15: ETT The patient exercised according to the CVN-BRUCE for 09:44 min:s, achieving a work level of Max. METS: 11.2. The resting heart rate of 55 bpm rose to a maximal heart rate of 136 bpm. This value represents 88 % of the maximal, age-predicted heart rate. The resting blood pressure of 143/73 mmHg , rose to a maximum blood pressure of 222/67 mmHg. The exercise test was stopped due to sob, Fatigue Summary: ST Changes: NO SIGN ST-T CHANGES.  Recent Labs: 09/19/2020: ALT 25; BUN 20; Creatinine, Ser 1.21; Hemoglobin 13.5; Platelets 165.0; Potassium 4.4; Sodium 141; TSH 4.11  09/19/2020: Cholesterol 108; HDL 34.20; LDL Cholesterol 63; Total CHOL/HDL Ratio 3; Triglycerides 54.0; VLDL 10.8   Estimated Creatinine Clearance: 58.8 mL/min (by  C-G formula based on SCr of 1.21 mg/dL).   Wt Readings from Last 3 Encounters:  09/19/20 187 lb 12.8 oz (85.2 kg)  09/02/20 189 lb 3.2 oz (85.8 kg)  08/29/20 189 lb 3.2 oz (85.8 kg)     Other studies reviewed: Additional studies/records reviewed today include: summarized above    ASSESSMENT AND PLAN:  1. PAFib, atypical AFlutter     s/p PVI ablations and AFlutter ablation     CHA2DS2Vasc is 3, on Eliquis, appropriately dosed      He is unaware of  his AFib, remains with good exertional capacity We discussed strategy currently is rate control   2. PPM, sinus node dysfunction     Intact function, no programming changes made     Programmed AAI         3. CAD     On statin     No symptoms     Discussed getting re-established with Dr. Percival Spanish given his CAD, he is agreeable  Disposition: He will see Dr. Percival Spanish in about 65mo EP service/Dr. KBerenice Boutonin a year, sooner if needed.  Remotes as usual    Current medicines are reviewed at length with the patient today.  The patient did not have any concerns regarding medicines.  SHaywood Lasso PA-C 10/02/2020 9:24 AM     [place] [phone](office)  [phone](fax)

## 2020-10-03 ENCOUNTER — Ambulatory Visit: Payer: PPO | Admitting: Physician Assistant

## 2020-10-03 ENCOUNTER — Other Ambulatory Visit: Payer: Self-pay

## 2020-10-03 ENCOUNTER — Encounter: Payer: Self-pay | Admitting: Physician Assistant

## 2020-10-03 VITALS — BP 118/60 | HR 73 | Ht 71.0 in | Wt 195.6 lb

## 2020-10-03 DIAGNOSIS — I2510 Atherosclerotic heart disease of native coronary artery without angina pectoris: Secondary | ICD-10-CM | POA: Diagnosis not present

## 2020-10-03 DIAGNOSIS — I480 Paroxysmal atrial fibrillation: Secondary | ICD-10-CM | POA: Diagnosis not present

## 2020-10-03 DIAGNOSIS — Z950 Presence of cardiac pacemaker: Secondary | ICD-10-CM | POA: Diagnosis not present

## 2020-10-03 DIAGNOSIS — I4819 Other persistent atrial fibrillation: Secondary | ICD-10-CM | POA: Diagnosis not present

## 2020-10-03 NOTE — Patient Instructions (Signed)
Medication Instructions:   Your physician recommends that you continue on your current medications as directed. Please refer to the Current Medication list given to you today.  *If you need a refill on your cardiac medications before your next appointment, please call your pharmacy*   Lab Work: [place]   If you have labs (blood work) drawn today and your tests are completely normal, you will receive your results only by: Marland Mancini MyChart Message (if you have MyChart) OR . A paper copy in the mail If you have any lab test that is abnormal or we need to change your treatment, we will call you to review the results.   Testing/Procedures: NONE ORDERED  TODAY   Follow-Up: At [facility], you and your health needs are our priority.  As part of our continuing mission to provide you with exceptional heart care, we have created designated Provider Care Teams.  These Care Teams include your primary Cardiologist (physician) and Advanced Practice Providers (APPs -  Physician Assistants and Nurse Practitioners) who all work together to provide you with the care you need, when you need it.  We recommend signing up for the patient portal called "MyChart".  Sign up information is provided on this After Visit Summary.  MyChart is used to connect with patients for Virtual Visits (Telemedicine).  Patients are able to view lab/test results, encounter notes, upcoming appointments, etc.  Non-urgent messages can be sent to your provider as well.   To learn more about what you can do with MyChart, go to [email or web address].    Your next appointment:    Dr. Percival Spanish. In 6 MONTHS  You will receive a reminder letter in the mail two months in advance. If you don't receive a letter, please call our office to schedule the follow-up appointment.  1 year(s)  The format for your next appointment:   In Person  Provider:   You may see Virl Axe, MD or one of the following Advanced Practice Providers  on your designated Care Team:    Chanetta Marshall, NP  Tommye Standard, [place]  Legrand Como "Jonni Sanger" [place], [place]   Other Instructions:

## 2020-10-14 ENCOUNTER — Telehealth: Payer: Self-pay

## 2020-10-14 NOTE — Telephone Encounter (Signed)
Appt made for 11/22 at 1050 am with Dr Ardis Hughs.  The pt has been advised via My Chart.  I have confirmed that pt sees his My Chart Messages

## 2020-10-14 NOTE — Telephone Encounter (Signed)
----- Message from Milus Banister, MD sent at 10/14/2020  7:21 AM EST ----- Regarding: RE: Abnormal CT of the abdomen Will do, thanks  Clancey Welton, He needs first available OV with me, add to wait list for cancellation as well.  Thanks  ----- Message ----- From: Cassandria Anger, MD Sent: 10/13/2020   7:38 PM EST To: Milus Banister, MD Subject: Abnormal CT of the abdomen                      Hi Dan, Please see if you need to see Ed for his liver and spleen abnormalities on this CT scan.  Let me know. Hope you are well, AP      CLINICAL DATA:  74 year old male with history of hematuria for the past several days. Intermittent abdominal discomfort. Patient is on Xarelto.  EXAM: CT ABDOMEN AND PELVIS WITHOUT AND WITH CONTRAST  TECHNIQUE: Multidetector CT imaging of the abdomen and pelvis was performed following the standard protocol before and following the bolus administration of intravenous contrast.  CONTRAST:  16mL OMNIPAQUE IOHEXOL 300 MG/ML  SOLN  COMPARISON:  CT the abdomen and pelvis 09/01/2013.  FINDINGS: Lower chest: Aortic atherosclerosis. Pacemaker lead terminating in the right ventricle near the apex.  Hepatobiliary: Liver has a slightly shrunken appearance and nodular contour, suggesting early changes of cirrhosis. No discrete cystic or solid hepatic lesions. No intra or extrahepatic biliary ductal dilatation. Gallbladder is nearly decompressed, but otherwise unremarkable in appearance.  Pancreas: No pancreatic mass. No pancreatic ductal dilatation. No pancreatic or peripancreatic fluid collections or inflammatory changes.  Spleen: Unremarkable.  Adrenals/Urinary Tract: 3 mm nonobstructive calculus in the interpolar collecting system of the left kidney. No additional calcifications are noted within the right renal collecting system, along the course of either ureter, or within the lumen of the urinary bladder. No hydroureteronephrosis.  Bilateral kidneys and adrenal glands are normal in appearance. Postcontrast delayed images demonstrate no definite filling defects within the collecting system of either kidney, along the well opacified portions of either ureter, or within the lumen of the urinary bladder to strongly suggest the presence of urothelial neoplasm. Urinary bladder is normal in appearance.  Stomach/Bowel: Normal appearance of the stomach. No pathologic dilatation of small bowel or colon. A few scattered colonic diverticulae are noted, most evident in the region of the sigmoid colon, without surrounding inflammatory changes to suggest an acute diverticulitis at this time. Normal appendix.  Vascular/Lymphatic: Aortic atherosclerosis, without evidence of aneurysm or dissection in the abdominal or pelvic vasculature. Multiple prominent borderline enlarged retroperitoneal lymph nodes are noted, measuring up to 9 mm in short axis in the aortocaval nodal station. In addition, mildly enlarged lymph nodes are evident adjacent to the porta hepatis, largest of which is in the hepatoduodenal ligament measuring 1.6 cm in short axis.  Reproductive: Prostate gland and seminal vesicles are unremarkable in appearance.  Other: No significant volume of ascites.  No pneumoperitoneum.  Musculoskeletal: There are no aggressive appearing lytic or blastic lesions noted in the visualized portions of the skeleton.  IMPRESSION: 1. 3 mm nonobstructive calculus in the interpolar collecting system of left kidney. No ureteral stones or findings of urinary tract obstruction are noted at this time. 2. Mild colonic diverticulosis without evidence of acute diverticulitis at this time. 3. Morphologic changes in the liver suggestive of early cirrhosis. No suspicious hepatic lesions are noted at this time. 4. Multiple prominent borderline enlarged and mildly enlarged retroperitoneal and upper abdominal lymph nodes most  evident in  the region of the porta hepatis. This is nonspecific, and favored to be related to intrinsic liver disease. Attention on any future follow-up imaging is recommended to ensure the stability or resolution of these findings. 5. Aortic atherosclerosis.   Electronically Signed   By: Vinnie Langton M.D.   On: 08/15/2020 08:56

## 2020-10-21 ENCOUNTER — Ambulatory Visit: Payer: PPO | Admitting: Gastroenterology

## 2020-10-21 ENCOUNTER — Encounter: Payer: Self-pay | Admitting: Gastroenterology

## 2020-10-21 VITALS — BP 140/70 | HR 70 | Ht 71.0 in | Wt 196.0 lb

## 2020-10-21 DIAGNOSIS — R9389 Abnormal findings on diagnostic imaging of other specified body structures: Secondary | ICD-10-CM

## 2020-10-21 NOTE — Progress Notes (Signed)
Review of pertinent gastrointestinal problems: 1. History of Barrett's esophagus.No dysplasia seen on serial endoscopies 2004, 2005, 2007. Most recent EGD 2009 found short segment of Barrett's appearing mucosa, biopsies did not confirm intestinal metaplasia however. EGD may 2011 also found short segment, non-nodular Barrett's mucosa however biopsies again showed no intestinal metaplasia.EGD Dr. Ardis Hughs 10/2012 slightly irregular z line, non-nodular, biopsies showed no IM, no further surveillance was recommended.  EGD July 2021 for dyspepsia showed slightly irregular Z-line that was unchanged from previous examinations.  It was not sampled.  There was mild to moderate nonspecific gastritis.  Pathology negative for H. pylori 2. Personal history of precancerous colon polyps. Adenomatous polyp removed 2003 by Dr. Velora Heckler. Followup colonoscopy 2005 found no polyps. Dr. Inocente Salles recommended that he have a repeat colonoscopy at 7 year interval. Colonoscopy may 2011 found left-sided diverticulosis, small colon polyp that was an adenoma.Colonoscopy 05/2015 Dr. Ardis Hughs found single subCM adenoma, internal hems, diverticulosis.  Colonoscopy July 2021 single subcentimeter polyp was removed.  Diverticulosis and hemorrhoids were noted.  Pathology showed tubular adenoma, recommended repeat examination at 7 years 3. Internal hems. Banded by Dr. Carlean Purl [phone].   HPI: This is a very pleasant 74 year old man who was referred to me by [name], Evie Lacks, MD  to evaluate recent abnormal CT scans.     He had a CT scan September 2021 with IV and oral contrast, done for hematuria and intermittent abdominal pains.  He was on eliquis.  This suggested possible cirrhosis and there were "multiple prominent borderline enlarged and mildly enlarged retroperitoneal and upper abdominal lymph nodes most evident in the region of the porta hepatis"  Another CT scan abdomen pelvis with IV contrast was done 3 days later; indication "right  lower quadrant/suprapubic abdominal pain" this suggested "prominent porta hepatis and retroperitoneal lymph nodes appear similar to 08/2013  examination and are thus presumably reactive in etiology"  Blood work October 2021 total bilirubin 2.7 otherwise completely normal liver tests and complete metabolic profile, normal CBC with normal platelets  Multiple liver tests over the past 5 years have shown elevated total bilirubin with a significant indirect predominance.  Suggestive of Gilbert's syndrome  This all started with overt hematuria.  It sounds like he probably passed a kidney stone.  The readings on his 2 scans which were 3 days apart are a bit discrepant and we discussed that.  I have a lot of respect for both of the radiologist that read those 2 examinations.  He has no personal history of liver disease.  Alcoholism does run in his family and so he is quite careful about what he drinks.  At most he will have 2 wine glasses per day and this is in the minority of evenings.  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  His weight is down 8 to 10 pounds in the past year or so.   Review of systems: Pertinent positive and negative review of systems were noted in the above HPI section. All other review negative.   Past Medical History:  Diagnosis Date  . Anxiety   . Aortic stenosis    Mild, echo, April, 2014  . BPH (benign prostatic hyperplasia)   . CAD (coronary artery disease)    a. s/p CABG  . Carotid artery disease ([place])   . Colonic polyp   . Diverticulosis   . Elevated bilirubin    Mild chronic elevation, 2.0 January,  2011 stable  . GERD (gastroesophageal reflux disease)    Barrett's esophagus  . History of kidney stones   . HTN (hypertension)   . Hyperlipidemia    Low HDL  . 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 Persistent atrial fibrillation ([place])    a. s/p  PVI at [facility]  . Precancerous lesion    Forehead  . Primary osteoarthritis of left knee    Mild  . Prolapsed internal hemorrhoids, grade 3 08/12/2015  . PVC's (premature ventricular contractions)   . Tubular adenoma of colon     Past Surgical History:  Procedure Laterality Date  . ABLATION     [facility] at [facility]  . ATRIAL FIBRILLATION ABLATION N/A 01/25/2019   Procedure: ATRIAL FIBRILLATION ABLATION;  Surgeon: [name], MD;  Location: [facility];  Service: Cardiovascular;  Laterality: N/A;  . CARDIOVERSION N/A 10/14/2018   Procedure: CARDIOVERSION;  Surgeon: Fay Records, MD;  Location: [facility] [place];  Service: Cardiovascular;  Laterality: N/A;  . CARDIOVERSION N/A 08/18/2019   Procedure: CARDIOVERSION;  Surgeon: Jerline Pain, MD;  Location: [facility]OS[facility];  Service: Cardiovascular;  Laterality: N/A;  . COLONOSCOPY    . CORONARY ARTERY BYPASS GRAFT  2000   CABG X5  . CYSTOSCOPY WITH RETROGRADE PYELOGRAM, URETEROSCOPY AND STENT PLACEMENT Bilateral 09/04/2020   Procedure: CYSTOSCOPY WITH BILATERAL RETROGRADE PYELOGRAM, RIGHT URETEROSCOPY AND RIGHT  STENT PLACEMENT;  Surgeon: Franchot Gallo, MD;  Location: [facility];  Service: Urology;  Laterality: Bilateral;  . ESOPHAGOGASTRODUODENOSCOPY    . HEAD & NECK SKIN LESION EXCISIONAL BIOPSY    . HEMORRHOID BANDING    . INTRAVASCULAR PRESSURE WIRE/FFR STUDY N/A 01/05/2018   Procedure: INTRAVASCULAR PRESSURE WIRE/FFR STUDY;  Surgeon: Sherren Mocha, MD;  Location: [facility];  Service: Cardiovascular;  Laterality: N/A;  . LEFT HEART CATH AND CORS/GRAFTS ANGIOGRAPHY N/A 01/05/2018   Procedure: LEFT HEART CATH AND CORS/GRAFTS ANGIOGRAPHY;  Surgeon: Sherren Mocha, MD;  Location: [facility];  Service: Cardiovascular;  Laterality: N/A;  . PERMANENT PACEMAKER INSERTION N/A 07/15/2012   Procedure: PERMANENT PACEMAKER INSERTION;  Surgeon: Deboraha Sprang, MD;  Location: [facility] LAB;  Service: Cardiovascular;  Laterality: N/A;  . rotator  cuff surgery Right 2017  . TONSILLECTOMY  1956  . WISDOM TOOTH EXTRACTION      Current Outpatient Medications  Medication Sig Dispense Refill  . acetaminophen (TYLENOL) 500 MG tablet Take 1,000 mg by mouth every 6 (six) hours as needed for moderate pain or headache.    [place] apixaban (ELIQUIS) 5 MG TABS tablet Take 1 tablet (5 mg total) by mouth 2 (two) times daily. 180 tablet 3  . atorvastatin (LIPITOR) 20 MG tablet Take 1 tablet (20 mg total) by mouth daily at 6 PM. 90 tablet 1  . Cholecalciferol (D3-1000) 25 MCG (1000 UT) capsule Take 1,000 Units by mouth daily with supper.     . NONFORMULARY OR COMPOUNDED ITEM Apply 1 application topically daily as needed (rash). Cetaphil + triamcinolone cream    . pantoprazole (PROTONIX) 40 MG tablet TAKE 1 TABLET ONCE DAILY. (Patient taking differently: Take 40 mg by mouth daily. ) 30 tablet 11  . polyvinyl alcohol (LIQUIFILM TEARS) 1.4 % ophthalmic solution Place 1 drop into both eyes every 8 (eight) hours as needed for dry eyes.    . ramipril (ALTACE) 2.5 MG capsule TAKE (1) CAPSULE DAILY. (Patient taking differently: Take 2.5 mg by mouth daily. ) 90 capsule 1  Current Facility-Administered Medications  Medication Dose Route Frequency Provider Last Rate Last Admin  . 0.9 %  sodium chloride infusion  500 mL Intravenous Once Milus Banister, MD        Allergies as of 10/21/2020 - Review Complete 10/21/2020  Allergen Reaction Noted  . Cayenne Other (See Comments) 10/29/2015  . Niacin and related Other (See Comments) 08/06/2016  . Sulfonamide derivatives Other (See Comments)     Family History  Problem Relation Age of Onset  . Coronary artery disease Other 70  . Diabetes Other   . Multiple myeloma Mother   . Diabetes Father   . Renal Disease Father   . Hyperlipidemia Other   . Hypertension Other   . Colon cancer Neg Hx   . Esophageal cancer Neg Hx   . Stomach cancer Neg Hx   . Rectal cancer Neg Hx     Social History   Socioeconomic  History  . Marital status: Married    Spouse name: Not on file  . Number of children: 2  . Years of education: 57  . Highest education level: Not on file  Occupational History  . 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 Smoking status: Never Smoker  . Smokeless tobacco: Never Used  Vaping Use  . Vaping Use: Never used  Substance and Sexual Activity  . Alcohol use: Yes    Comment: 1-2 glasses wine  . Drug use: No  . Sexual activity: Yes    Partners: Female  Other Topics Concern  . Not on file  Social History Narrative   [employer] [place], [place].  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  2 daughters-'75, '79; 1 grandchild; step-daughter and step grandson.  Regular exercise-yes, runs 1.5-2 mi 4x/wk, also eliptical      Patient signed a Designated Party Release to allow his wife, Jovani Flury, to have access to his medical records/ information.   Social Determinants of Health   Financial Resource Strain:   . Difficulty of Paying Living Expenses: Not on file  Food Insecurity:   . Worried About Charity fundraiser in the Last Year: Not on file  . Ran Out of Food in the Last Year: Not on file  Transportation Needs:   . Lack of Transportation (Medical): Not on file  . Lack of Transportation (Non-Medical): Not on file  Physical Activity:   . Days of Exercise per Week: Not on file  . Minutes of Exercise per Session: Not on file  Stress:   . Feeling of Stress : Not on file  Social Connections:   . Frequency of Communication with Friends and Family: Not on file  . Frequency of Social Gatherings with Friends and Family: Not on file  . Attends Religious Services: Not on file  . Active Member of Clubs or Organizations: Not on file  . Attends Archivist Meetings: Not on file  . Marital Status: Not on file  Intimate  Partner Violence:   . Fear of Current or Ex-Partner: Not on file  . Emotionally Abused: Not on file  . Physically Abused: Not on file  . Sexually Abused: Not on file     Physical Exam: BP 140/70   Pulse 70   Ht '5\' 11"'  (1.803 m)   Wt 196 lb (88.9 kg)   BMI 27.34 kg/m  Constitutional: generally well-appearing Psychiatric:  alert and oriented x3 Eyes: extraocular movements intact Mouth: oral pharynx moist, no lesions Neck: supple no lymphadenopathy Cardiovascular: heart regular rate and rhythm Lungs: clear to auscultation bilaterally Abdomen: soft, nontender, nondistended, no obvious ascites, no peritoneal signs, normal bowel sounds Extremities: no lower extremity edema bilaterally Skin: no lesions on visible extremities   Assessment and plan: 74 y.o. male with abnormal imaging of liver, history of Gilbert's syndrome  First he has nothing historically to suggest that he has cirrhosis.  Alcoholism does run in his family and he is careful about how much he drinks.  Given the discrepant CT reports I recommended we proceed with ultrasound with elasticity, fibroscore to get a better idea if he truly has underlying cirrhosis which I think is unlikely.  If this is clearly negative then no further testing is necessary.  If this suggests that he has cirrhosis then he understands he will need a battery of blood tests as well as periodic imaging of his liver and visits to the office here to see if he is developing any decompensation.  The scattered retroperitoneal adenopathy is very unlikely to be a sign of anything serious since it has not changed in 7 years.  No further testing is necessary for that.  Please see the "Patient Instructions" section for addition details about the plan.   Russell Loffler, MD [facility] 10/21/2020, 11:02 AM  Cc: Cassandria Anger, MD  Total time on date of encounter was 45  minutes (this included time spent preparing to see the patient  reviewing records; obtaining and/or reviewing separately obtained history; performing a medically appropriate exam and/or evaluation; counseling and educating the patient and family if present; ordering medications, tests or procedures if applicable; and documenting clinical information in the health record).

## 2020-10-21 NOTE — Patient Instructions (Signed)
If you are age 72 or older, your body mass index should be between 23-30. Your Body mass index is 27.34 kg/m. If this is out of the aforementioned range listed, please consider follow up with your Primary Care Provider.  If you are age 75 or younger, your body mass index should be between 19-25. Your Body mass index is 27.34 kg/m. If this is out of the aformentioned range listed, please consider follow up with your Primary Care Provider.   You have been scheduled for an abdominal ultrasound at [facility] (1st floor of hospital) on 10-29-20 at 10:00am. Please arrive 15 minutes prior to your appointment for registration. Make certain not to have anything to eat or drink after midnight the night prior to your appointment. Should you need to reschedule your appointment, please contact radiology at [phone]. This test typically takes about 30 minutes to perform.  Due to recent changes in healthcare laws, you may see the results of your imaging and laboratory studies on MyChart before your provider has had a chance to review them.  We understand that in some cases there may be results that are confusing or concerning to you. Not all laboratory results come back in the same time frame and the provider may be waiting for multiple results in order to interpret others.  Please give [place] 48 hours in order for your provider to thoroughly review all the results before contacting the office for clarification of your results.   Thank you for entrusting me with your care and choosing [facility].  Dr Ardis Hughs

## 2020-10-29 ENCOUNTER — Other Ambulatory Visit: Payer: Self-pay

## 2020-10-29 ENCOUNTER — Ambulatory Visit (HOSPITAL_COMMUNITY)
Admission: RE | Admit: 2020-10-29 | Discharge: 2020-10-29 | Disposition: A | Discharge status: Home / Self Care | Payer: PPO | Source: Ambulatory Visit | Attending: Gastroenterology | Admitting: Gastroenterology

## 2020-10-29 DIAGNOSIS — R9389 Abnormal findings on diagnostic imaging of other specified body structures: Secondary | ICD-10-CM | POA: Diagnosis not present

## 2020-10-29 DIAGNOSIS — K829 Disease of gallbladder, unspecified: Secondary | ICD-10-CM | POA: Diagnosis not present

## 2020-11-15 ENCOUNTER — Other Ambulatory Visit: Payer: Self-pay

## 2020-11-15 ENCOUNTER — Ambulatory Visit (INDEPENDENT_AMBULATORY_CARE_PROVIDER_SITE_OTHER): Payer: PPO

## 2020-11-15 VITALS — BP 118/60 | HR 61 | Temp 98.1°F | Ht 71.0 in | Wt 198.0 lb

## 2020-11-15 DIAGNOSIS — Z0000 Encounter for general adult medical examination without abnormal findings: Secondary | ICD-10-CM | POA: Diagnosis not present

## 2020-11-15 NOTE — Progress Notes (Addendum)
Subjective:   Russell Griffith is a 74 y.o. male who presents for Medicare Annual/Subsequent preventive examination.  Review of Systems    No ROS. Medicare Wellness Visit. Additional risk factors are reflected in social history. Cardiac Risk Factors include: advanced age (>44mn, >>9women);hypertension;family history of premature cardiovascular disease;dyslipidemia;male gender     Objective:    Today's Vitals   11/15/20 0833  BP: 118/60  Pulse: 61  Temp: 98.1 F (36.7 C)  SpO2: 96%  Weight: 198 lb (89.8 kg)  Height: '5\' 11"'  (1.803 m)  PainSc: 0-No pain   Body mass index is 27.62 kg/m.  Advanced Directives 11/15/2020 09/04/2020 08/29/2020 10/06/2019 08/18/2019 01/25/2019 10/14/2018  Does Patient Have a Medical Advance Directive? Yes Yes Yes Yes Yes Yes Yes  Type of AParamedicof ALos CerrillosLiving will HEast ClevelandLiving will HMaytownLiving will HLewisvilleLiving will HOldhamLiving will HMercedLiving will HE[place] Does patient want to make changes to medical advance directive? No - Patient declined - - - - No - Patient declined -  Copy of HCarson Cityin Chart? No - copy requested - Yes - validated most recent copy scanned in chart (See row information) No - copy requested No - copy requested No - copy requested No - copy requested  Would patient like information on creating a medical advance directive? - - - - - - -    Current Medications (verified) Outpatient Encounter Medications as of 11/15/2020  Medication Sig   acetaminophen (TYLENOL) 500 MG tablet Take 1,000 mg by mouth every 6 (six) hours as needed for moderate pain or headache.   apixaban (ELIQUIS) 5 MG TABS tablet Take 1 tablet (5 mg total) by mouth 2 (two) times daily.   atorvastatin (LIPITOR) 20 MG tablet Take 1 tablet (20 mg total) by mouth daily at 6 PM.    Cholecalciferol (D3-1000) 25 MCG (1000 UT) capsule Take 1,000 Units by mouth daily with supper.    NONFORMULARY OR COMPOUNDED ITEM Apply 1 application topically daily as needed (rash). Cetaphil + triamcinolone cream   pantoprazole (PROTONIX) 40 MG tablet TAKE 1 TABLET ONCE DAILY. (Patient taking differently: Take 40 mg by mouth daily. )   polyvinyl alcohol (LIQUIFILM TEARS) 1.4 % ophthalmic solution Place 1 drop into both eyes every 8 (eight) hours as needed for dry eyes.   ramipril (ALTACE) 2.5 MG capsule TAKE (1) CAPSULE DAILY. (Patient taking differently: Take 2.5 mg by mouth daily. )   Facility-Administered Encounter Medications as of 11/15/2020  Medication   0.9 %  sodium chloride infusion    Allergies (verified) Cayenne, Niacin and related, and Sulfonamide derivatives   History: Past Medical History:  Diagnosis Date   Anxiety    Aortic stenosis    Mild, echo, April, 2014   BPH (benign prostatic hyperplasia)    CAD (coronary artery disease)    a. s/p CABG   Carotid artery disease (HCC)    Colonic polyp    Diverticulosis    Elevated bilirubin    Mild chronic elevation, 2.0 January, 2011 stable   GERD (gastroesophageal reflux disease)    Barrett's esophagus   History of kidney stones    HTN (hypertension)    Hyperlipidemia    Low HDL   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HBenjamin Perez    Left  lung chest x-ray July, 2013   Persistent atrial fibrillation (HCoosa    a. s/p PVI at  [facility]   Precancerous lesion    Forehead   Primary osteoarthritis of left knee    Mild   Prolapsed internal hemorrhoids, grade 3 08/12/2015   PVC's (premature ventricular contractions)    Tubular adenoma of colon    Past Surgical History:  Procedure Laterality Date   ABLATION     PVI at [place] N/A 01/25/2019   Procedure: [place];  Surgeon: [name], MD;  Location: [facility];  Service: Cardiovascular;  Laterality: N/A;   CARDIOVERSION N/A 10/14/2018    Procedure: CARDIOVERSION;  Surgeon: Fay Records, MD;  Location: [place];  Service: Cardiovascular;  Laterality: N/A;   CARDIOVERSION N/A 08/18/2019   Procedure: CARDIOVERSION;  Surgeon: Jerline Pain, MD;  Location: [place];  Service: Cardiovascular;  Laterality: N/A;   COLONOSCOPY     CORONARY ARTERY BYPASS GRAFT  2000   CABG X5   CYSTOSCOPY WITH RETROGRADE PYELOGRAM, URETEROSCOPY AND STENT PLACEMENT Bilateral 09/04/2020   Procedure: CYSTOSCOPY WITH BILATERAL RETROGRADE PYELOGRAM, RIGHT URETEROSCOPY AND RIGHT  STENT PLACEMENT;  Surgeon: Franchot Gallo, MD;  Location: [facility];  Service: Urology;  Laterality: Bilateral;   ESOPHAGOGASTRODUODENOSCOPY     HEAD & NECK SKIN LESION EXCISIONAL BIOPSY     HEMORRHOID BANDING     INTRAVASCULAR PRESSURE WIRE/FFR STUDY N/A 01/05/2018   Procedure: INTRAVASCULAR PRESSURE WIRE/FFR STUDY;  Surgeon: Sherren Mocha, MD;  Location: [facility] [facility];  Service: Cardiovascular;  Laterality: N/A;   LEFT HEART CATH AND CORS/GRAFTS ANGIOGRAPHY N/A 01/05/2018   Procedure: LEFT HEART CATH AND CORS/GRAFTS ANGIOGRAPHY;  Surgeon: Sherren Mocha, MD;  Location: [place] [facility];  Service: Cardiovascular;  Laterality: N/A;   PERMANENT PACEMAKER INSERTION N/A 07/15/2012   Procedure: PERMANENT PACEMAKER INSERTION;  Surgeon: Deboraha Sprang, MD;  Location: [facility];  Service: Cardiovascular;  Laterality: N/A;   rotator cuff surgery Right 2017   TONSILLECTOMY  1956   WISDOM TOOTH EXTRACTION     Family History  Problem Relation Age of Onset   Coronary artery disease Other 43   Diabetes Other    Multiple myeloma Mother    Diabetes Father    Renal Disease Father    Hyperlipidemia Other    Hypertension Other    Colon cancer Neg Hx    Esophageal cancer Neg Hx    Stomach cancer Neg Hx    Rectal cancer Neg Hx    Social History   Socioeconomic History   Marital status: Married    Spouse name: Not on file   Number of children: 2   Years of education: 18    Highest education level: Not on file  Occupational History   Occupation: Technical brewer: [employer]    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  2 daughters-'75, '79; 1 grandchild; step-daughter and step grandson.  Regular exercise-yes, runs 1.5-2 mi 4x/wk, also eliptical      Patient signed a Radio producer  Release to allow his wife, Jacorey Griffith, to have access to his medical records/ information.   Social Determinants of Health   Financial Resource Strain: Low Risk    Difficulty of Paying Living Expenses: Not hard at all  Food Insecurity: No Food Insecurity   Worried About Charity fundraiser in the Last Year: Never true   [place] in the Last Year: Never true  Transportation Needs: No Transportation Needs   Lack of Transportation (Medical): No   Lack of Transportation (Non-Medical): No  Physical Activity: Sufficiently Active   Days of Exercise per Week: 7 days   Minutes of Exercise per Session: 60 min  Stress: Stress Concern Present   Feeling of Stress : Rather much  Social Connections: Socially Integrated   Frequency of Communication with Friends and Family: More than three times a week   Frequency of Social Gatherings with Friends and Family: More than three times a week   Attends Religious Services: More than 4 times per year   Active Member of [employer] or Organizations: Yes   Attends Music therapist: More than 4 times per year   Marital Status: Married    Tobacco  Counseling Counseling given: Not Answered   Clinical Intake:  Pre-visit preparation completed: Yes  Pain : No/denies pain Pain Score: 0-No pain     BMI - recorded: 27.62 Nutritional Status: BMI 25 -29 Overweight Nutritional Risks: None Diabetes: No  How often do you need to have someone help you when you read instructions, pamphlets, or other written materials from your doctor or pharmacy?: 1 - Never What is the last grade level you completed in school?: Graduate School  Diabetic? no  Interpreter Needed?: No  Information entered by :: Lisette Abu, LPN   Activities of Daily Living In your present state of health, do you have any difficulty performing the following activities: 11/15/2020 08/29/2020  Hearing? N N  Vision? N N  Difficulty concentrating or making decisions? N N  Walking or climbing stairs? N N  Dressing or bathing? N N  Doing errands, shopping? N N  Preparing Food and eating ? N -  Using the Toilet? N -  In the past six months, have you accidently leaked urine? N -  Do you have problems with loss of bowel control? N -  Managing your Medications? N -  Managing your Finances? N -  Housekeeping or managing your Housekeeping? N -  Some recent data might be hidden    Patient Care Team: [name], Evie Lacks, MD as PCP - General (Internal Medicine) Deboraha Sprang, MD as PCP - Electrophysiology (Cardiology) Stefanie Libel, MD (Family Medicine) Deboraha Sprang, MD (Cardiology) Jarome Matin, MD as Consulting Physician (Dermatology) Milus Banister, MD as Attending Physician (Gastroenterology) Minus Breeding, MD as Consulting Physician (Cardiology) Franchot Gallo, MD as Consulting Physician (Urology)  Indicate any recent Medical Services you may have received from other than [facility] providers in the past year (date may be approximate).     Assessment:   This is a routine wellness examination for Russell.  Hearing/Vision screen No exam data  present  Dietary issues and exercise activities discussed: Current Exercise Habits: Home exercise routine (Still works full-time), Type of exercise: strength training/weights;walking;stretching;treadmill, Time (Minutes): 60, Frequency (Times/Week): 7, Weekly Exercise (Minutes/Week): 420, Intensity: Moderate, Exercise limited by: cardiac condition(s)  Goals       Client understands the importance of follow-up with providers by attending scheduled visits (pt-stated)  lose weight to get to 180      Continue to decrease carbohydrates at most meals, exercise more by increasing  Exercise to 5 times per week.        Depression Screen PHQ 2/9 Scores 11/15/2020 10/06/2019 08/23/2019 08/18/2018 06/29/2017 08/06/2016 03/06/2015  PHQ - 2 Score 0 0 0 0 0 0 0  Exception Documentation - - - - - - Other- indicate reason in comment box    Fall Risk Fall Risk  11/15/2020 10/06/2019 08/18/2018 06/29/2017 08/06/2016  Falls in the past year? 0 0 No No No  Number falls in past yr: 0 0 - - -  Injury with Fall? 0 0 - - -  Risk for fall due to : No Fall Risks - - - -  Follow up Falls evaluation completed - - - -    FALL RISK PREVENTION PERTAINING TO THE HOME:  Any stairs in or around the home? Yes  If so, are there any without handrails? No  Home free of loose throw rugs in walkways, pet beds, electrical cords, etc? Yes  Adequate lighting in your home to reduce risk of falls? Yes   ASSISTIVE DEVICES UTILIZED TO PREVENT FALLS:  Life alert? No  Use of a cane, walker or w/c? No  Grab bars in the bathroom? No  Shower chair or bench in shower? No  Elevated toilet seat or a handicapped toilet? No   TIMED UP AND GO:  Was the test performed? No .  Length of time to ambulate 10 feet: 0 sec.   Gait steady and fast without use of assistive device  Cognitive Function: Normal cognitive status assessed by direct observation by this Nurse Health Advisor. No abnormalities found.          Immunizations Immunization History  Administered Date(s) Administered   Fluad Quad(high Dose 65+) 08/23/2019, 09/19/2020   Influenza Whole 08/31/2009   Influenza, High Dose Seasonal PF 08/12/2017   Influenza,inj,Quad PF,6+ Mos 08/06/2016   Influenza-Unspecified 08/24/2014, 08/30/2015   PFIZER SARS-COV-2 Vaccination 12/17/2019, 01/07/2020   Pneumococcal Conjugate-13 09/13/2013   Pneumococcal Polysaccharide-23 03/28/2009, 07/23/2015   Td 03/28/2009   Tdap 08/23/2019   Zoster 09/13/2013   Zoster Recombinat (Shingrix) 03/08/2018, 06/17/2018    TDAP status: Up to date  Flu Vaccine status: Up to date  Pneumococcal vaccine status: Up to date  Covid-19 vaccine status: Completed vaccines  Qualifies for Shingles Vaccine? Yes   Zostavax completed Yes   Shingrix Completed?: Yes  Screening Tests Health Maintenance  Topic Date Due   COVID-19 Vaccine (3 - Booster for Pfizer series) 07/06/2020   COLONOSCOPY  06/26/2027   TETANUS/TDAP  08/22/2029   INFLUENZA VACCINE  Completed   Hepatitis C Screening  Completed   PNA vac Low Risk Adult  Completed    Health Maintenance  Health Maintenance Due  Topic Date Due   COVID-19 Vaccine (3 - Booster for Pfizer series) 07/06/2020    Colorectal cancer screening: Type of screening: Colonoscopy. Completed 06/25/2020. Repeat every 7 years  Lung Cancer Screening: (Low Dose CT Chest recommended if Age 54-80 years, 30 pack-year currently smoking OR have quit w/in 15years.) does not qualify.   Lung Cancer Screening Referral: no  Additional Screening:  Hepatitis C Screening: does qualify; Completed yes  Vision Screening: Recommended annual ophthalmology exams for early detection of glaucoma and other disorders of the eye. Is the patient up to date with their annual eye exam?  Yes  Who is the provider or what is the name  of the office in which the patient attends annual eye exams? [facility]  If pt is not established with a  provider, would they like to be referred to a provider to establish care? No .   Dental Screening: Recommended annual dental exams for proper oral hygiene  Community Resource Referral / Chronic Care Management: CRR required this visit?  No   CCM required this visit?  No      Plan:     I have personally reviewed and noted the following in the patient's chart:   Medical and social history Use of alcohol, tobacco or illicit drugs  Current medications and supplements Functional ability and status Nutritional status Physical activity Advanced directives List of other physicians Hospitalizations, surgeries, and ER visits in previous 12 months Vitals Screenings to include cognitive, depression, and falls Referrals and appointments  In addition, I have reviewed and discussed with patient certain preventive protocols, quality metrics, and best practice recommendations. A written personalized care plan for preventive services as well as general preventive health recommendations were provided to patient.     Sheral Flow, LPN   [id number]   Nurse Notes: n/a  Medical screening examination/treatment/procedure(s) were performed by non-physician practitioner and as supervising physician I was immediately available for consultation/collaboration.  I agree with above. Lew Dawes, MD

## 2020-11-15 NOTE — Patient Instructions (Signed)
Russell Griffith , Thank you for taking time to come for your Medicare Wellness Visit. I appreciate your ongoing commitment to your health goals. Please review the following plan we discussed and let me know if I can assist you in the future.   Screening recommendations/referrals: Colonoscopy: 06/25/2020; due every 7 years Recommended yearly ophthalmology/optometry visit for glaucoma screening and checkup Recommended yearly dental visit for hygiene and checkup  Vaccinations: Influenza vaccine: 09/19/2020 Pneumococcal vaccine: up to date Tdap vaccine: 08/23/2019; due every 10 years Shingles vaccine: up to date   Covid-19: up to date  Advanced directives: Please bring a copy of your health care power of attorney and living will to the office at your convenience.  Conditions/risks identified: Yes; Reviewed health maintenance screenings with patient today and relevant education, vaccines, and/or referrals were provided. Please continue to do your personal lifestyle choices by: daily care of teeth and gums, regular physical activity (goal should be 5 days a week for 30 minutes), eat a healthy diet, avoid tobacco and drug use, limiting any alcohol intake, taking a low-dose aspirin (if not allergic or have been advised by your provider otherwise) and taking vitamins and minerals as recommended by your provider. Continue doing brain stimulating activities (puzzles, reading, adult coloring books, staying active) to keep memory sharp. Continue to eat heart healthy diet (full of fruits, vegetables, whole grains, lean protein, water--limit salt, fat, and sugar intake) and increase physical activity as tolerated.  Next appointment: Please schedule your next Medicare Wellness Visit with your Nurse Health Advisor in 1 year by calling [phone].  Preventive Care 72 Years and Older, Male Preventive care refers to lifestyle choices and visits with your health care provider that can promote health and wellness. What  does preventive care include?  A yearly physical exam. This is also called an annual well check.  Dental exams once or twice a year.  Routine eye exams. Ask your health care provider how often you should have your eyes checked.  Personal lifestyle choices, including:  Daily care of your teeth and gums.  Regular physical activity.  Eating a healthy diet.  Avoiding tobacco and drug use.  Limiting alcohol use.  Practicing safe sex.  Taking low doses of aspirin every day.  Taking vitamin and mineral supplements as recommended by your health care provider. What happens during an annual well check? The services and screenings done by your health care provider during your annual well check will depend on your age, overall health, lifestyle risk factors, and family history of disease. Counseling  Your health care provider may ask you questions about your:  Alcohol use.  Tobacco use.  Drug use.  Emotional well-being.  Home and relationship well-being.  Sexual activity.  Eating habits.  History of falls.  Memory and ability to understand (cognition).  Work and work Statistician. Screening  You may have the following tests or measurements:  Height, weight, and BMI.  Blood pressure.  Lipid and cholesterol levels. These may be checked every 5 years, or more frequently if you are over 54 years old.  Skin check.  Lung cancer screening. You may have this screening every year starting at age 23 if you have a 30-pack-year history of smoking and currently smoke or have quit within the past 15 years.  Fecal occult blood test (FOBT) of the stool. You may have this test every year starting at age 28.  Flexible sigmoidoscopy or colonoscopy. You may have a sigmoidoscopy every 5 years or a colonoscopy every 10  years starting at age 35.  Prostate cancer screening. Recommendations will vary depending on your family history and other risks.  Hepatitis C blood test.  Hepatitis  B blood test.  Sexually transmitted disease (STD) testing.  Diabetes screening. This is done by checking your blood sugar (glucose) after you have not eaten for a while (fasting). You may have this done every 1-3 years.  Abdominal aortic aneurysm (AAA) screening. You may need this if you are a current or former smoker.  Osteoporosis. You may be screened starting at age 43 if you are at high risk. Talk with your health care provider about your test results, treatment options, and if necessary, the need for more tests. Vaccines  Your health care provider may recommend certain vaccines, such as:  Influenza vaccine. This is recommended every year.  Tetanus, diphtheria, and acellular pertussis (Tdap, Td) vaccine. You may need a Td booster every 10 years.  Zoster vaccine. You may need this after age 52.  Pneumococcal 13-valent conjugate (PCV13) vaccine. One dose is recommended after age 23.  Pneumococcal polysaccharide (PPSV23) vaccine. One dose is recommended after age 87. Talk to your health care provider about which screenings and vaccines you need and how often you need them. This information is not intended to replace advice given to you by your health care provider. Make sure you discuss any questions you have with your health care provider. Document Released: 12/13/2015 Document Revised: 08/05/2016 Document Reviewed: 09/17/2015 Elsevier Interactive Patient Education  2017 [place] Prevention in the Home Falls can cause injuries. They can happen to people of all ages. There are many things you can do to make your home safe and to help prevent falls. What can I do on the outside of my home?  Regularly fix the edges of walkways and driveways and fix any cracks.  Remove anything that might make you trip as you walk through a door, such as a raised step or threshold.  Trim any bushes or trees on the path to your home.  Use bright outdoor lighting.  Clear any walking  paths of anything that might make someone trip, such as rocks or tools.  Regularly check to see if handrails are loose or broken. Make sure that both sides of any steps have handrails.  Any raised decks and porches should have guardrails on the edges.  Have any leaves, snow, or ice cleared regularly.  Use sand or salt on walking paths during winter.  Clean up any spills in your garage right away. This includes oil or grease spills. What can I do in the bathroom?  Use night lights.  Install grab bars by the toilet and in the tub and shower. Do not use towel bars as grab bars.  Use non-skid mats or decals in the tub or shower.  If you need to sit down in the shower, use a plastic, non-slip stool.  Keep the floor dry. Clean up any water that spills on the floor as soon as it happens.  Remove soap buildup in the tub or shower regularly.  Attach bath mats securely with double-sided non-slip rug tape.  Do not have throw rugs and other things on the floor that can make you trip. What can I do in the bedroom?  Use night lights.  Make sure that you have a light by your bed that is easy to reach.  Do not use any sheets or blankets that are too big for your bed. They should not hang  down onto the floor.  Have a firm chair that has side arms. You can use this for support while you get dressed.  Do not have throw rugs and other things on the floor that can make you trip. What can I do in the [place]?  Clean up any spills right away.  Avoid walking on wet floors.  Keep items that you use a lot in easy-to-reach places.  If you need to reach something above you, use a strong step stool that has a grab bar.  Keep electrical cords out of the way.  Do not use floor polish or wax that makes floors slippery. If you must use wax, use non-skid floor wax.  Do not have throw rugs and other things on the floor that can make you trip. What can I do with my stairs?  Do not leave any items  on the stairs.  Make sure that there are handrails on both sides of the stairs and use them. Fix handrails that are broken or loose. Make sure that handrails are as long as the stairways.  Check any carpeting to make sure that it is firmly attached to the stairs. Fix any carpet that is loose or worn.  Avoid having throw rugs at the top or bottom of the stairs. If you do have throw rugs, attach them to the floor with carpet tape.  Make sure that you have a light switch at the top of the stairs and the bottom of the stairs. If you do not have them, ask someone to add them for you. What else can I do to help prevent falls?  Wear shoes that:  Do not have high heels.  Have rubber bottoms.  Are comfortable and fit you well.  Are closed at the toe. Do not wear sandals.  If you use a stepladder:  Make sure that it is fully opened. Do not climb a closed stepladder.  Make sure that both sides of the stepladder are locked into place.  Ask someone to hold it for you, if possible.  Clearly mark and make sure that you can see:  Any grab bars or handrails.  First and last steps.  Where the edge of each step is.  Use tools that help you move around (mobility aids) if they are needed. These include:  Canes.  Walkers.  Scooters.  Crutches.  Turn on the lights when you go into a dark area. Replace any light bulbs as soon as they burn out.  Set up your furniture so you have a clear path. Avoid moving your furniture around.  If any of your floors are uneven, fix them.  If there are any pets around you, be aware of where they are.  Review your medicines with your doctor. Some medicines can make you feel dizzy. This can increase your chance of falling. Ask your doctor what other things that you can do to help prevent falls. This information is not intended to replace advice given to you by your health care provider. Make sure you discuss any questions you have with your health care  provider. Document Released: 09/12/2009 Document Revised: 04/23/2016 Document Reviewed: 12/21/2014 Elsevier Interactive Patient Education  2017 [employer].

## 2020-12-01 ENCOUNTER — Other Ambulatory Visit: Payer: Self-pay | Admitting: Cardiology

## 2020-12-20 ENCOUNTER — Ambulatory Visit (INDEPENDENT_AMBULATORY_CARE_PROVIDER_SITE_OTHER): Payer: PPO

## 2020-12-20 DIAGNOSIS — I4819 Other persistent atrial fibrillation: Secondary | ICD-10-CM

## 2020-12-20 LAB — CUP PACEART REMOTE DEVICE CHECK
Battery Remaining Longevity: 30 mo
Battery Remaining Percentage: 35 %
Brady Statistic RA Percent Paced: 0 %
Brady Statistic RV Percent Paced: 0 %
Date Time Interrogation Session: 20220121013200
Implantable Lead Implant Date: 20130816
Implantable Lead Implant Date: 20130816
Implantable Lead Location: 753859
Implantable Lead Location: 753860
Implantable Lead Model: 5076
Implantable Lead Model: 5076
Implantable Pulse Generator Implant Date: 20130816
Lead Channel Impedance Value: 514 Ohm
Lead Channel Impedance Value: 819 Ohm
Lead Channel Pacing Threshold Amplitude: 0.9 V
Lead Channel Pacing Threshold Pulse Width: 0.4 ms
Lead Channel Setting Pacing Amplitude: 2 V
Lead Channel Setting Sensing Sensitivity: 2.5 mV
Pulse Gen Serial Number: 111255

## 2021-01-01 NOTE — Progress Notes (Signed)
Remote pacemaker transmission.   

## 2021-01-09 ENCOUNTER — Encounter: Payer: Self-pay | Admitting: Orthopaedic Surgery

## 2021-01-09 ENCOUNTER — Ambulatory Visit: Payer: PPO | Admitting: Orthopaedic Surgery

## 2021-01-09 ENCOUNTER — Other Ambulatory Visit: Payer: Self-pay

## 2021-01-09 ENCOUNTER — Ambulatory Visit (INDEPENDENT_AMBULATORY_CARE_PROVIDER_SITE_OTHER): Payer: PPO

## 2021-01-09 VITALS — Ht 71.0 in | Wt 198.0 lb

## 2021-01-09 DIAGNOSIS — M25512 Pain in left shoulder: Secondary | ICD-10-CM

## 2021-01-09 DIAGNOSIS — G8929 Other chronic pain: Secondary | ICD-10-CM

## 2021-01-09 MED ORDER — BUPIVACAINE HCL 0.25 % IJ SOLN
2.0000 mL | INTRAMUSCULAR | Status: AC | PRN
  Administered 2021-01-09: 2 mL via INTRA_ARTICULAR

## 2021-01-09 NOTE — Progress Notes (Signed)
Office Visit Note   Patient: Russell Griffith           Date of Birth: August 28, 1946           MRN: [id number] Visit Date: 01/09/2021              Requested by: Cassandria Anger, MD [place],  [place] [id number] PCP: [name], Evie Lacks, MD   Assessment & Plan: Visit Diagnoses:  1. Chronic left shoulder pain     Plan: Left shoulder pain consistent with impingement. No weakness. No grinding or grating. No localized areas of tenderness except along the posterior subacromial region with external rotation. Minimally positive Speed sign. Biceps appears to be intact. X-rays were nondiagnostic. Will inject the subacromial space and monitor response. Consider CT scan or CT arthrogram if no better  Follow-Up Instructions: Return if symptoms worsen or fail to improve.   Orders:  Orders Placed This Encounter  Procedures  . Large Joint Inj: L subacromial bursa  . XR Shoulder Left   No orders of the defined types were placed in this encounter.     Procedures: Large Joint Inj: L subacromial bursa on 01/09/2021 3:58 PM Indications: pain and diagnostic evaluation Details: 25 G 1.5 in needle, anterolateral approach  Arthrogram: No  Medications: 2 mL bupivacaine 0.25 %  12 mg betamethasone injected with Marcaine in the subacromial space left shoulder Consent was given by the patient. Immediately prior to procedure a time out was called to verify the correct patient, procedure, equipment, support staff and site/side marked as required. Patient was prepped and draped in the usual sterile fashion.       Clinical Data: No additional findings.   Subjective: Chief Complaint  Patient presents with  . Left Shoulder - Pain  Patient presents today for left shoulder pain. He said that it started to hurt about a month ago. No injury, but states that he remembers lifting a heavy TV and feeling a strain in his shoulder. He cannot point to where it hurts, but does have pain  when reaching for his seatbelt or lifting weights. He is not taking anything for pain. No previous left shoulder surgery. He is not taking anything for pain.  He is right hand dominant. Had prior right shoulder surgery for rotator cuff tear and biceps tenodesis and has done very well. He is right-hand dominant  HPI  Review of Systems   Objective: Vital Signs: Ht '5\' 11"'  (1.803 m)   Wt 198 lb (89.8 kg)   BMI 27.62 kg/m   Physical Exam Constitutional:      Appearance: He is well-developed and well-nourished.  HENT:     Mouth/Throat:     Mouth: Oropharynx is clear and moist.  Eyes:     Extraocular Movements: EOM normal.     Pupils: Pupils are equal, round, and reactive to light.  Pulmonary:     Effort: Pulmonary effort is normal.  Skin:    General: Skin is warm and dry.  Neurological:     Mental Status: He is alert and oriented to person, place, and time.  Psychiatric:        Mood and Affect: Mood and affect normal.        Behavior: Behavior normal.     Ortho Exam awake alert and oriented x3. Comfortable sitting. Full overhead range of motion without hesitancy. Good strength with internal/external rotation. Biceps intact. Did have some pain with speeds sign testing but it was more along  the posterior subacromial region then along the biceps tendon. Skin intact. No loss of motion  Specialty Comments:  No specialty comments available.  Imaging: XR Shoulder Left  Result Date: 01/09/2021 Films of the left shoulder obtained in several projections. No acute changes. Humeral head is centered about the glenoid. Normal space between the humeral head and the acromium. Some degenerative changes at the [facility] joint with hypertrophy of the distal clavicle. Very little inferior overhang of the acromion. Some cystic change in the greater tuberosity. Implanted pacemaker identified in the left chest wall    PMFS History: Patient Active Problem List   Diagnosis Date Noted  . Pain in left  shoulder 01/09/2021  . Lymphadenopathy 09/23/2020  . Abnormal LFTs 09/19/2020  . Acute prostatitis with hematuria 08/16/2020  . Gross hematuria 08/12/2020  . Bloating 05/22/2020  . Supraclavicular adenopathy 02/29/2020  . Abnormal cardiovascular stress test 01/05/2018  . Left shoulder pain 11/12/2016  . Pain of left thumb 11/12/2016  . Abdominal pain, epigastric 10/29/2015  . Prolapsed internal hemorrhoids, grade 3 08/12/2015  . IT band syndrome 07/23/2015  . Partial tear of rotator cuff 12/04/2014  . Well adult exam 07/03/2014  . Jock itch 07/03/2014  . Dyslipidemia 03/06/2014  . Ejection fraction   . Diverticulitis of colon without hemorrhage 08/31/2013  . Mitral regurgitation   . Tricuspid regurgitation   . HX: anticoagulation   . Diffuse anterior T-wave inversions 10/24/2012  . PPM-[employer] 07/16/2012  . 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 Shortness of breath   . Aortic stenosis   . Plantar fasciitis of right foot 04/04/2012  . Numbness of toes   . CAD (coronary artery disease)   . Hx of CABG   . Drug therapy   . HTN (hypertension)   . Drug intolerance   . Pulmonic stenosis   . Aortic insufficiency   . Chronic anticoagulation   . Hypokalemia   . A-fib ([place])   . QT prolongation on Tikosyn 03/26/2011  . PVC's (premature ventricular contractions)   . Carotid artery disease ([place])   . Elevated bilirubin   . Abnormal TSH 04/01/2010  . DERANGEMENT OF MENISCUS NOT ELSEWHERE CLASSIFIED 12/03/2009  . Knee pain, right anterior 12/03/2009  . BENIGN PROSTATIC HYPERTROPHY, WITH OBSTRUCTION 09/30/2009  . Disorders of bursae and tendons in shoulder region, unspecified 08/22/2009  . 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  . ADENOMATOUS COLONIC POLYP 03/06/2008  . ANXIETY DEPRESSION 03/06/2008  . ESOPHAGEAL STENOSIS 03/06/2008  . BARRETTS ESOPHAGUS 03/06/2008  . GASTRITIS, CHRONIC  03/06/2008  . DUODENITIS 03/06/2008  . PROSTATITIS, HX OF 03/06/2008  . OSTEOARTHRITIS, KNEE, LEFT 11/04/2007  . FOOT PAIN, LEFT 11/04/2007  . Hx of colonic polyp 08/09/2007   Past Medical History:  Diagnosis Date  . Anxiety   . Aortic stenosis    Mild, echo, April, 2014  . BPH (benign prostatic hyperplasia)   . CAD (coronary artery disease)    a. s/p CABG  . Carotid artery disease ([place])   . Colonic polyp   . Diverticulosis   . Elevated bilirubin    Mild chronic elevation, 2.0 January, 2011 stable  . GERD (gastroesophageal reflux disease)    Barrett's esophagus  . History of kidney stones   . HTN (hypertension)   . Hyperlipidemia    Low HDL  . 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 Persistent atrial fibrillation ([place])  a. s/p PVI [facility]  . Precancerous lesion    Forehead  . Primary osteoarthritis of left knee    Mild  . Prolapsed internal hemorrhoids, grade 3 08/12/2015  . PVC's (premature ventricular contractions)   . Tubular adenoma of colon     Family History  Problem Relation Age of Onset  . Coronary artery disease Other 70  . Diabetes Other   . Multiple myeloma Mother   . Diabetes Father   . Renal Disease Father   . Hyperlipidemia Other   . Hypertension Other   . Colon cancer Neg Hx   . Esophageal cancer Neg Hx   . Stomach cancer Neg Hx   . Rectal cancer Neg Hx     Past Surgical History:  Procedure Laterality Date  . ABLATION     PVI [facility]  . ATRIAL FIBRILLATION ABLATION N/A 01/25/2019   Procedure: ATRIAL FIBRILLATION ABLATION;  Surgeon: [name], MD;  Location: [place] [facility];  Service: Cardiovascular;  Laterality: N/A;  . CARDIOVERSION N/A 10/14/2018   Procedure: CARDIOVERSION;  Surgeon: Fay Records, MD;  Location: [place];  Service: Cardiovascular;  Laterality: N/A;  . CARDIOVERSION N/A 08/18/2019   Procedure: CARDIOVERSION;  Surgeon: Jerline Pain, MD;  Location: [facility] ENDOSCOPY;  Service: Cardiovascular;   Laterality: N/A;  . COLONOSCOPY    . CORONARY ARTERY BYPASS GRAFT  2000   CABG X5  . CYSTOSCOPY WITH RETROGRADE PYELOGRAM, URETEROSCOPY AND STENT PLACEMENT Bilateral 09/04/2020   Procedure: CYSTOSCOPY WITH BILATERAL RETROGRADE PYELOGRAM, RIGHT URETEROSCOPY AND RIGHT  STENT PLACEMENT;  Surgeon: Franchot Gallo, MD;  Location: [facility];  Service: Urology;  Laterality: Bilateral;  . ESOPHAGOGASTRODUODENOSCOPY    . HEAD & NECK SKIN LESION EXCISIONAL BIOPSY    . HEMORRHOID BANDING    . INTRAVASCULAR PRESSURE WIRE/FFR STUDY N/A 01/05/2018   Procedure: INTRAVASCULAR PRESSURE WIRE/FFR STUDY;  Surgeon: Sherren Mocha, MD;  Location: [place] [facility];  Service: Cardiovascular;  Laterality: N/A;  . LEFT HEART CATH AND CORS/GRAFTS ANGIOGRAPHY N/A 01/05/2018   Procedure: LEFT HEART CATH AND CORS/GRAFTS ANGIOGRAPHY;  Surgeon: Sherren Mocha, MD;  Location: [place] [facility];  Service: Cardiovascular;  Laterality: N/A;  . PERMANENT PACEMAKER INSERTION N/A 07/15/2012   Procedure: PERMANENT PACEMAKER INSERTION;  Surgeon: Deboraha Sprang, MD;  Location: [facility] CATH LAB;  Service: Cardiovascular;  Laterality: N/A;  . rotator cuff surgery Right 2017  . TONSILLECTOMY  1956  . WISDOM TOOTH EXTRACTION     Social History   Occupational History  . 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 Smoking status: Never Smoker  . Smokeless tobacco: Never Used  Vaping Use  . Vaping Use: Never used  Substance and Sexual Activity  . Alcohol use: Yes    Comment: 1-2 glasses wine  . Drug use: No  . Sexual activity: Yes    Partners: Female

## 2021-01-29 ENCOUNTER — Other Ambulatory Visit: Payer: Self-pay

## 2021-01-29 ENCOUNTER — Encounter: Payer: Self-pay | Admitting: Orthopaedic Surgery

## 2021-01-29 ENCOUNTER — Ambulatory Visit: Payer: PPO | Admitting: Orthopaedic Surgery

## 2021-01-29 VITALS — Ht 71.0 in | Wt 198.0 lb

## 2021-01-29 DIAGNOSIS — M25512 Pain in left shoulder: Secondary | ICD-10-CM

## 2021-01-29 DIAGNOSIS — G8929 Other chronic pain: Secondary | ICD-10-CM

## 2021-01-29 NOTE — Progress Notes (Signed)
Office Visit Note   Patient: Russell Griffith           Date of Birth: [birth year]           MRN: [id number] Visit Date: 01/29/2021              Requested by: Cassandria Anger, MD [place],  [place] [address] PCP: [name], Evie Lacks, MD   Assessment & Plan: Visit Diagnoses:  1. Chronic left shoulder pain     Plan: It was seen about 3 weeks ago for evaluation of left shoulder pain.  I injected the subacromial space with initial relief.  However , now having similar pain prior to the injection with some discomfort with overhead motion and sleeping on that side.  I think it is worth obtaining a CT arthrogram.  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   Orders:  Orders Placed This Encounter  Procedures  . CT SHOULDER LEFT W CONTRAST  . Arthrogram   No orders of the defined types were placed in this encounter.     Procedures: No procedures performed   Clinical Data: No additional findings.   Subjective: Chief Complaint  Patient presents with  . Left Shoulder - Follow-up  Patient presents today for a three week follow up on his left shoulder. He received a cortisone injection at his visit on 01/09/21. Patient states that the injection helped for about a week. His pain is now worse than it was before. He does not take anything for pain. He is right hand dominant.  Still having difficulty raising his arm over his head and sleeping on the left side.  Not having any numbness or tingling.  Has had successful prior right shoulder rotator cuff tear repair with biceps tenodesis  HPI  Review of Systems   Objective: Vital Signs: Ht '5\' 11"'  (1.803 m)   Wt 198 lb (89.8 kg)   BMI 27.62 kg/m   Physical Exam Constitutional:      Appearance: He is well-developed and well-nourished.  HENT:     Mouth/Throat:     Mouth: Oropharynx is clear and moist.  Eyes:     Extraocular Movements: EOM normal.      Pupils: Pupils are equal, round, and reactive to light.  Pulmonary:     Effort: Pulmonary effort is normal.  Skin:    General: Skin is warm and dry.  Neurological:     Mental Status: He is alert and oriented to person, place, and time.  Psychiatric:        Mood and Affect: Mood and affect normal.        Behavior: Behavior normal.     Ortho Exam left shoulder with full overhead motion and abduction.  Does have positive Speed sign.  Minimally positive impingement testing.  Negative empty can testing.  Biceps intact good grip and good release.  Specialty Comments:  No specialty comments available.  Imaging: No results found.   PMFS History: Patient Active Problem List   Diagnosis Date Noted  . Pain in left shoulder 01/09/2021  . Lymphadenopathy 09/23/2020  . Abnormal LFTs 09/19/2020  . Acute prostatitis with hematuria 08/16/2020  . Gross hematuria 08/12/2020  . Bloating 05/22/2020  . Supraclavicular adenopathy 02/29/2020  . Abnormal cardiovascular stress test 01/05/2018  . Left shoulder pain 11/12/2016  . Pain of left thumb 11/12/2016  . Abdominal pain, epigastric 10/29/2015  . Prolapsed internal hemorrhoids, grade 3 08/12/2015  .  IT band syndrome 07/23/2015  . Partial tear of rotator cuff 12/04/2014  . Well adult exam 07/03/2014  . Jock itch 07/03/2014  . Dyslipidemia 03/06/2014  . Ejection fraction   . Diverticulitis of colon without hemorrhage 08/31/2013  . Mitral regurgitation   . Tricuspid regurgitation   . HX: anticoagulation   . Diffuse anterior T-wave inversions 10/24/2012  . PPM-[employer] 07/16/2012  . 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 Shortness of breath   . Aortic stenosis   . Plantar fasciitis of right foot 04/04/2012  . Numbness of toes   . CAD (coronary artery disease)   . Hx of CABG   . Drug therapy   . HTN (hypertension)   . Drug intolerance   . Pulmonic  stenosis   . Aortic insufficiency   . Chronic anticoagulation   . Hypokalemia   . A-fib ([place])   . QT prolongation on Tikosyn 03/26/2011  . PVC's (premature ventricular contractions)   . Carotid artery disease ([place])   . Elevated bilirubin   . Abnormal TSH 04/01/2010  . DERANGEMENT OF MENISCUS NOT ELSEWHERE CLASSIFIED 12/03/2009  . Knee pain, right anterior 12/03/2009  . BENIGN PROSTATIC HYPERTROPHY, WITH OBSTRUCTION 09/30/2009  . Disorders of bursae and tendons in shoulder region, unspecified 08/22/2009  . 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  . ADENOMATOUS COLONIC POLYP 03/06/2008  . ANXIETY DEPRESSION 03/06/2008  . ESOPHAGEAL STENOSIS 03/06/2008  . BARRETTS ESOPHAGUS 03/06/2008  . GASTRITIS, CHRONIC 03/06/2008  . DUODENITIS 03/06/2008  . PROSTATITIS, HX OF 03/06/2008  . OSTEOARTHRITIS, KNEE, LEFT 11/04/2007  . FOOT PAIN, LEFT 11/04/2007  . Hx of colonic polyp 08/09/2007   Past Medical History:  Diagnosis Date  . Anxiety   . Aortic stenosis    Mild, echo, April, 2014  . BPH (benign prostatic hyperplasia)   . CAD (coronary artery disease)    a. s/p CABG  . Carotid artery disease ([place])   . Colonic polyp   . Diverticulosis   . Elevated bilirubin    Mild chronic elevation, 2.0 January, 2011 stable  . GERD (gastroesophageal reflux disease)    Barrett's esophagus  . History of kidney stones   . HTN (hypertension)   . Hyperlipidemia    Low HDL  . 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 Persistent atrial fibrillation ([place])    a. s/p [facility]VI at [facility]  . Precancerous lesion    Forehead  . Primary osteoarthritis of left knee    Mild  . Prolapsed internal hemorrhoids, grade 3 08/12/2015  . PVC's (premature ventricular contractions)   . Tubular adenoma of colon     Family History  Problem Relation Age of Onset  . Coronary artery disease Other 70  . Diabetes Other   . Multiple myeloma Mother   . Diabetes Father   . Renal Disease Father    . Hyperlipidemia Other   . Hypertension Other   . Colon cancer Neg Hx   . Esophageal cancer Neg Hx   . Stomach cancer Neg Hx   . Rectal cancer Neg Hx     Past Surgical History:  Procedure Laterality Date  . ABLATION     [facility]  . ATRIAL FIBRILLATION ABLATION N/A 01/25/2019   Procedure: ATRIAL FIBRILLATION ABLATION;  Surgeon: [name], MD;  Location: [place] [facility];  Service: Cardiovascular;  Laterality: N/A;  . CARDIOVERSION N/A 10/14/2018   Procedure:  CARDIOVERSION;  Surgeon: Fay Records, MD;  Location: [place];  Service: Cardiovascular;  Laterality: N/A;  . CARDIOVERSION N/A 08/18/2019   Procedure: CARDIOVERSION;  Surgeon: Jerline Pain, MD;  Location: [facility] ENDOSCOPY;  Service: Cardiovascular;  Laterality: N/A;  . COLONOSCOPY    . CORONARY ARTERY BYPASS GRAFT  2000   CABG X5  . CYSTOSCOPY WITH RETROGRADE PYELOGRAM, URETEROSCOPY AND STENT PLACEMENT Bilateral 09/04/2020   Procedure: CYSTOSCOPY WITH BILATERAL RETROGRADE PYELOGRAM, RIGHT URETEROSCOPY AND RIGHT  STENT PLACEMENT;  Surgeon: Franchot Gallo, MD;  Location: [facility];  Service: Urology;  Laterality: Bilateral;  . ESOPHAGOGASTRODUODENOSCOPY    . HEAD & NECK SKIN LESION EXCISIONAL BIOPSY    . HEMORRHOID BANDING    . INTRAVASCULAR PRESSURE WIRE/FFR STUDY N/A 01/05/2018   Procedure: INTRAVASCULAR PRESSURE WIRE/FFR STUDY;  Surgeon: Sherren Mocha, MD;  Location: [place] [facility];  Service: Cardiovascular;  Laterality: N/A;  . LEFT HEART CATH AND CORS/GRAFTS ANGIOGRAPHY N/A 01/05/2018   Procedure: LEFT HEART CATH AND CORS/GRAFTS ANGIOGRAPHY;  Surgeon: Sherren Mocha, MD;  Location: [place] [facility];  Service: Cardiovascular;  Laterality: N/A;  . PERMANENT PACEMAKER INSERTION N/A 07/15/2012   Procedure: PERMANENT PACEMAKER INSERTION;  Surgeon: Deboraha Sprang, MD;  Location: [facility] CATH LAB;  Service: Cardiovascular;  Laterality: N/A;  . rotator cuff surgery Right 2017  . TONSILLECTOMY  1956  . WISDOM TOOTH  EXTRACTION     Social History   Occupational History  . 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 Smoking status: Never Smoker  . Smokeless tobacco: Never Used  Vaping Use  . Vaping Use: Never used  Substance and Sexual Activity  . Alcohol use: Yes    Comment: 1-2 glasses wine  . Drug use: No  . Sexual activity: Yes    Partners: Female

## 2021-01-31 DIAGNOSIS — R310 Gross hematuria: Secondary | ICD-10-CM | POA: Diagnosis not present

## 2021-01-31 DIAGNOSIS — N5201 Erectile dysfunction due to arterial insufficiency: Secondary | ICD-10-CM | POA: Diagnosis not present

## 2021-02-17 ENCOUNTER — Other Ambulatory Visit: Payer: Self-pay

## 2021-02-17 ENCOUNTER — Ambulatory Visit
Admission: RE | Admit: 2021-02-17 | Discharge: 2021-02-17 | Disposition: A | Discharge status: Home / Self Care | Payer: PPO | Source: Ambulatory Visit | Attending: Orthopaedic Surgery | Admitting: Orthopaedic Surgery

## 2021-02-17 DIAGNOSIS — M25512 Pain in left shoulder: Secondary | ICD-10-CM

## 2021-02-17 DIAGNOSIS — G8929 Other chronic pain: Secondary | ICD-10-CM

## 2021-02-17 DIAGNOSIS — M75102 Unspecified rotator cuff tear or rupture of left shoulder, not specified as traumatic: Secondary | ICD-10-CM | POA: Diagnosis not present

## 2021-02-17 MED ORDER — IOPAMIDOL (ISOVUE-M 200) INJECTION 41%
15.0000 mL | Freq: Once | INTRAMUSCULAR | Status: AC
  Administered 2021-02-17: 15 mL via INTRA_ARTICULAR

## 2021-02-20 ENCOUNTER — Encounter: Payer: Self-pay | Admitting: Orthopaedic Surgery

## 2021-02-20 ENCOUNTER — Ambulatory Visit: Payer: PPO | Admitting: Orthopaedic Surgery

## 2021-02-20 ENCOUNTER — Other Ambulatory Visit: Payer: Self-pay

## 2021-02-20 VITALS — Ht 71.0 in | Wt 198.0 lb

## 2021-02-20 DIAGNOSIS — M25512 Pain in left shoulder: Secondary | ICD-10-CM | POA: Diagnosis not present

## 2021-02-20 NOTE — Progress Notes (Signed)
Office Visit Note   Patient: Russell Griffith           Date of Birth: Feb 16, 1946           MRN: [id number] Visit Date: 02/20/2021              Requested by: Cassandria Anger, MD [place],  [place] [address] PCP: [name], Evie Lacks, MD   Assessment & Plan: Visit Diagnoses:  1. Left shoulder pain, unspecified chronicity     Plan: Ed had a CT scan with contrast left shoulder demonstrating somewhat thickened rotator cuff but otherwise intact.  There was no muscle atrophy or focal lesion.  Biceps long head was intact.  There was mild to moderate AC joint arthritis with a type I acromion.  Glenohumeral joint appeared to be normal as well as the labrum.  No focal bone abnormalities.  Had still having some trouble with overhead activities he is left hand nondominant.  We talked about exercises and using Voltaren gel and increasing his activities.  On an ongoing basis we may want to consider injecting the [facility] joint and even considering an arthroscopic SCD DCR then evaluating the biceps tendon.  He had a right shoulder rotator cuff tear repair and biceps tenodesis years ago.  The biceps tendon was significantly frayed and torn.  He is done very very well.  I am still little concerned because of a positive speeds side of the may be some pathology with the biceps tendon.  Follow-Up Instructions: Return if symptoms worsen or fail to improve.   Orders:  No orders of the defined types were placed in this encounter.  No orders of the defined types were placed in this encounter.     Procedures: No procedures performed   Clinical Data: No additional findings.   Subjective: Chief Complaint  Patient presents with  . Left Shoulder - Follow-up    CT review  Patient presents today for follow up on his left shoulder. He had a CT scan done and is here today for those results.  HPI  Review of Systems   Objective: Vital Signs: Ht '5\' 11"'  (1.803 m)   Wt 198 lb (89.8 kg)    BMI 27.62 kg/m   Physical Exam Constitutional:      Appearance: He is well-developed.  Eyes:     Pupils: Pupils are equal, round, and reactive to light.  Pulmonary:     Effort: Pulmonary effort is normal.  Skin:    General: Skin is warm and dry.  Neurological:     Mental Status: He is alert and oriented to person, place, and time.  Psychiatric:        Behavior: Behavior normal.     Ortho Exam awake alert and comfortable.  No acute distress.  Does have a positive Speed sign in the left shoulder with pain along the biceps tendon.  Biceps muscle intact and in normal position.  Little bit of pain at the [facility] joint but not much pain beneath that.  Full overhead motion.  Good strength.  Neurologically intact.  Skin intact Specialty Comments:  No specialty comments available.  Imaging: No results found.   PMFS History: Patient Active Problem List   Diagnosis Date Noted  . Pain in left shoulder 01/09/2021  . Lymphadenopathy 09/23/2020  . Abnormal LFTs 09/19/2020  . Acute prostatitis with hematuria 08/16/2020  . Gross hematuria 08/12/2020  . Bloating 05/22/2020  . Supraclavicular adenopathy 02/29/2020  . Abnormal cardiovascular stress  test 01/05/2018  . Left shoulder pain 11/12/2016  . Pain of left thumb 11/12/2016  . Abdominal pain, epigastric 10/29/2015  . Prolapsed internal hemorrhoids, grade 3 08/12/2015  . IT band syndrome 07/23/2015  . Partial tear of rotator cuff 12/04/2014  . Well adult exam 07/03/2014  . Jock itch 07/03/2014  . Dyslipidemia 03/06/2014  . Ejection fraction   . Diverticulitis of colon without hemorrhage 08/31/2013  . Mitral regurgitation   . Tricuspid regurgitation   . HX: anticoagulation   . Diffuse anterior T-wave inversions 10/24/2012  . PPM-[employer] 07/16/2012  . 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 Shortness of breath   . Aortic stenosis   . Plantar  fasciitis of right foot 04/04/2012  . Numbness of toes   . CAD (coronary artery disease)   . Hx of CABG   . Drug therapy   . HTN (hypertension)   . Drug intolerance   . Pulmonic stenosis   . Aortic insufficiency   . Chronic anticoagulation   . Hypokalemia   . A-fib ([place])   . QT prolongation on Tikosyn 03/26/2011  . PVC's (premature ventricular contractions)   . Carotid artery disease ([place])   . Elevated bilirubin   . Abnormal TSH 04/01/2010  . DERANGEMENT OF MENISCUS NOT ELSEWHERE CLASSIFIED 12/03/2009  . Knee pain, right anterior 12/03/2009  . BENIGN PROSTATIC HYPERTROPHY, WITH OBSTRUCTION 09/30/2009  . Disorders of bursae and tendons in shoulder region, unspecified 08/22/2009  . 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  . ADENOMATOUS COLONIC POLYP 03/06/2008  . ANXIETY DEPRESSION 03/06/2008  . ESOPHAGEAL STENOSIS 03/06/2008  . BARRETTS ESOPHAGUS 03/06/2008  . GASTRITIS, CHRONIC 03/06/2008  . DUODENITIS 03/06/2008  . PROSTATITIS, HX OF 03/06/2008  . OSTEOARTHRITIS, KNEE, LEFT 11/04/2007  . FOOT PAIN, LEFT 11/04/2007  . Hx of colonic polyp 08/09/2007   Past Medical History:  Diagnosis Date  . Anxiety   . Aortic stenosis    Mild, echo, April, 2014  . BPH (benign prostatic hyperplasia)   . CAD (coronary artery disease)    a. s/p CABG  . Carotid artery disease ([place])   . Colonic polyp   . Diverticulosis   . Elevated bilirubin    Mild chronic elevation, 2.0 January, 2011 stable  . GERD (gastroesophageal reflux disease)    Barrett's esophagus  . History of kidney stones   . HTN (hypertension)   . Hyperlipidemia    Low HDL  . 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 Persistent atrial fibrillation ([place])    a. s/p PVI at [facility]  . Precancerous lesion    Forehead  . Primary osteoarthritis of left knee    Mild  . Prolapsed internal hemorrhoids, grade 3 08/12/2015  . PVC's (premature ventricular contractions)   . Tubular adenoma of colon      Family History  Problem Relation Age of Onset  . Coronary artery disease Other 70  . Diabetes Other   . Multiple myeloma Mother   . Diabetes Father   . Renal Disease Father   . Hyperlipidemia Other   . Hypertension Other   . Colon cancer Neg Hx   . Esophageal cancer Neg Hx   . Stomach cancer Neg Hx   . Rectal cancer Neg Hx     Past Surgical History:  Procedure Laterality Date  . ABLATION     PVI at [facility]  . ATRIAL FIBRILLATION ABLATION N/A 01/25/2019  Procedure: ATRIAL FIBRILLATION ABLATION;  Surgeon: [name], MD;  Location: [employer] [facility];  Service: Cardiovascular;  Laterality: N/A;  . CARDIOVERSION N/A 10/14/2018   Procedure: CARDIOVERSION;  Surgeon: Fay Records, MD;  Location: [place];  Service: Cardiovascular;  Laterality: N/A;  . CARDIOVERSION N/A 08/18/2019   Procedure: CARDIOVERSION;  Surgeon: Jerline Pain, MD;  Location: [facility] ENDOSCOPY;  Service: Cardiovascular;  Laterality: N/A;  . COLONOSCOPY    . CORONARY ARTERY BYPASS GRAFT  2000   CABG X5  . CYSTOSCOPY WITH RETROGRADE PYELOGRAM, URETEROSCOPY AND STENT PLACEMENT Bilateral 09/04/2020   Procedure: CYSTOSCOPY WITH BILATERAL RETROGRADE PYELOGRAM, RIGHT URETEROSCOPY AND RIGHT  STENT PLACEMENT;  Surgeon: Franchot Gallo, MD;  Location: [facility];  Service: Urology;  Laterality: Bilateral;  . ESOPHAGOGASTRODUODENOSCOPY    . HEAD & NECK SKIN LESION EXCISIONAL BIOPSY    . HEMORRHOID BANDING    . INTRAVASCULAR PRESSURE WIRE/FFR STUDY N/A 01/05/2018   Procedure: INTRAVASCULAR PRESSURE WIRE/FFR STUDY;  Surgeon: Sherren Mocha, MD;  Location: [place] [facility];  Service: Cardiovascular;  Laterality: N/A;  . LEFT HEART CATH AND CORS/GRAFTS ANGIOGRAPHY N/A 01/05/2018   Procedure: LEFT HEART CATH AND CORS/GRAFTS ANGIOGRAPHY;  Surgeon: Sherren Mocha, MD;  Location: [place] [facility];  Service: Cardiovascular;  Laterality: N/A;  . PERMANENT PACEMAKER INSERTION N/A 07/15/2012   Procedure: PERMANENT PACEMAKER INSERTION;   Surgeon: Deboraha Sprang, MD;  Location: [facility] CATH LAB;  Service: Cardiovascular;  Laterality: N/A;  . rotator cuff surgery Right 2017  . TONSILLECTOMY  1956  . WISDOM TOOTH EXTRACTION     Social History   Occupational History  . 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 Smoking status: Never Smoker  . Smokeless tobacco: Never Used  Vaping Use  . Vaping Use: Never used  Substance and Sexual Activity  . Alcohol use: Yes    Comment: 1-2 glasses wine  . Drug use: No  . Sexual activity: Yes    Partners: Female

## 2021-03-07 DIAGNOSIS — H2513 Age-related nuclear cataract, bilateral: Secondary | ICD-10-CM | POA: Diagnosis not present

## 2021-03-07 DIAGNOSIS — H52203 Unspecified astigmatism, bilateral: Secondary | ICD-10-CM | POA: Diagnosis not present

## 2021-03-17 ENCOUNTER — Other Ambulatory Visit: Payer: Self-pay | Admitting: Internal Medicine

## 2021-03-21 ENCOUNTER — Ambulatory Visit (INDEPENDENT_AMBULATORY_CARE_PROVIDER_SITE_OTHER): Payer: PPO

## 2021-03-21 DIAGNOSIS — I4819 Other persistent atrial fibrillation: Secondary | ICD-10-CM | POA: Diagnosis not present

## 2021-03-22 LAB — CUP PACEART REMOTE DEVICE CHECK
Battery Remaining Longevity: 30 mo
Battery Remaining Percentage: 32 %
Brady Statistic RA Percent Paced: 0 %
Brady Statistic RV Percent Paced: 0 %
Date Time Interrogation Session: 20220422013100
Implantable Lead Implant Date: 20130816
Implantable Lead Implant Date: 20130816
Implantable Lead Location: 753859
Implantable Lead Location: 753860
Implantable Lead Model: 5076
Implantable Lead Model: 5076
Implantable Pulse Generator Implant Date: 20130816
Lead Channel Impedance Value: 534 Ohm
Lead Channel Impedance Value: 860 Ohm
Lead Channel Pacing Threshold Amplitude: 0.9 V
Lead Channel Pacing Threshold Pulse Width: 0.4 ms
Lead Channel Setting Pacing Amplitude: 2 V
Lead Channel Setting Sensing Sensitivity: 2.5 mV
Pulse Gen Serial Number: 111255

## 2021-03-31 NOTE — Progress Notes (Signed)
Cardiology Office Note   Date:  04/02/2021   ID:  Russell Griffith, DOB 12-20-1945, MRN [id number]  PCP:  Cassandria Anger, MD  Cardiologist:   Minus Breeding, MD    Chief Complaint  Patient presents with  . Atrial Fibrillation     History of Present Illness: Russell Griffith is a 75 y.o. male who presents for evaluation of known CAD.   He is well known to Dr. Ron Griffith and Dr. Caryl Griffith.    He has a history of coronary disease with his last catheterization as visualized below in 2019.  He has had 2 ablations 1 at [facility] and most recently by Dr. Rayann Heman but has persistent atrial fibrillation.  He has been previously on Tikosyn.  He did not tolerate Multaq.    He is now persistently in fibrillation and has resigned himself to being in this rhythm not wanting a third attempt at ablation.  He has had reasonable rate control.  He tolerates anticoagulation.  Since I last saw him he does have some mild decreased exercise tolerance that he suggests he is related to be fibrillation and also he understands he has had little aging.  He still exercises routinely.  He denies any ongoing chest pressure, neck or arm discomfort.  He notices his heart skipping sometimes at rest for his rhythm being irregular but not particularly during exercise.  He has not had any new shortness of breath, PND or orthopnea.  He has not had any new edema.   Past Medical History:  Diagnosis Date  . Anxiety   . Aortic stenosis    Mild, echo, April, 2014  . BPH (benign prostatic hyperplasia)   . CAD (coronary artery disease)    a. s/p CABG  . Carotid artery disease ([place])   . Colonic polyp   . Diverticulosis   . Elevated bilirubin    Mild chronic elevation, 2.0 January, 2011 stable  . GERD (gastroesophageal reflux disease)    Barrett's esophagus  . History of kidney stones   . HTN (hypertension)   . Hyperlipidemia    Low HDL  . 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 Persistent atrial  fibrillation ([place])    a. s/p PVI at [facility]  . Precancerous lesion    Forehead  . Primary osteoarthritis of left knee    Mild  . Prolapsed internal hemorrhoids, grade 3 08/12/2015  . PVC's (premature ventricular contractions)   . Tubular adenoma of colon     Past Surgical History:  Procedure Laterality Date  . ABLATION     PVI at [facility]  . ATRIAL FIBRILLATION ABLATION N/A 01/25/2019   Procedure: ATRIAL FIBRILLATION ABLATION;  Surgeon: [name], MD;  Location: [place] [facility];  Service: Cardiovascular;  Laterality: N/A;  . CARDIOVERSION N/A 10/14/2018   Procedure: CARDIOVERSION;  Surgeon: Fay Records, MD;  Location: [place];  Service: Cardiovascular;  Laterality: N/A;  . CARDIOVERSION N/A 08/18/2019   Procedure: CARDIOVERSION;  Surgeon: Jerline Pain, MD;  Location: [facility] ENDOSCOPY;  Service: Cardiovascular;  Laterality: N/A;  . COLONOSCOPY    . CORONARY ARTERY BYPASS GRAFT  2000   CABG X5  . CYSTOSCOPY WITH RETROGRADE PYELOGRAM, URETEROSCOPY AND STENT PLACEMENT Bilateral 09/04/2020   Procedure: CYSTOSCOPY WITH BILATERAL RETROGRADE PYELOGRAM, RIGHT URETEROSCOPY AND RIGHT  STENT PLACEMENT;  Surgeon: Franchot Gallo, MD;  Location: [facility] ORS;  Service: Urology;  Laterality: Bilateral;  . ESOPHAGOGASTRODUODENOSCOPY    .  HEAD & NECK SKIN LESION EXCISIONAL BIOPSY    . HEMORRHOID BANDING    . INTRAVASCULAR PRESSURE WIRE/FFR STUDY N/A 01/05/2018   Procedure: INTRAVASCULAR PRESSURE WIRE/FFR STUDY;  Surgeon: Sherren Mocha, MD;  Location: [place] [facility];  Service: Cardiovascular;  Laterality: N/A;  . LEFT HEART CATH AND CORS/GRAFTS ANGIOGRAPHY N/A 01/05/2018   Procedure: LEFT HEART CATH AND CORS/GRAFTS ANGIOGRAPHY;  Surgeon: Sherren Mocha, MD;  Location: [place] [facility];  Service: Cardiovascular;  Laterality: N/A;  . PERMANENT PACEMAKER INSERTION N/A 07/15/2012   Procedure: PERMANENT PACEMAKER INSERTION;  Surgeon: Deboraha Sprang, MD;  Location: [facility] CATH LAB;  Service: Cardiovascular;   Laterality: N/A;  . rotator cuff surgery Right 2017  . TONSILLECTOMY  1956  . WISDOM TOOTH EXTRACTION       Current Outpatient Medications  Medication Sig Dispense Refill  . acetaminophen (TYLENOL) 500 MG tablet Take 1,000 mg by mouth every 6 (six) hours as needed for moderate pain or headache.    [name] apixaban (ELIQUIS) 5 MG TABS tablet Take 1 tablet (5 mg total) by mouth 2 (two) times daily. 180 tablet 3  . atorvastatin (LIPITOR) 20 MG tablet TAKE 1 TABLET ONCE DAILY AT 6 PM. 90 tablet 2  . Cholecalciferol 25 MCG (1000 UT) capsule Take 1,000 Units by mouth daily with supper.     . NONFORMULARY OR COMPOUNDED ITEM Apply 1 application topically daily as needed (rash). Cetaphil + triamcinolone cream    . pantoprazole (PROTONIX) 40 MG tablet TAKE 1 TABLET ONCE DAILY. (Patient taking differently: Take 40 mg by mouth daily.) 30 tablet 11  . polyvinyl alcohol (LIQUIFILM TEARS) 1.4 % ophthalmic solution Place 1 drop into both eyes every 8 (eight) hours as needed for dry eyes.    . ramipril (ALTACE) 2.5 MG capsule Take 1 capsule (2.5 mg total) by mouth daily. 90 capsule 1   Current Facility-Administered Medications  Medication Dose Route Frequency Provider Last Rate Last Admin  . 0.9 %  sodium chloride infusion  500 mL Intravenous Once Milus Banister, MD        Allergies:   Gretta Arab, Niacin and related, and Sulfonamide derivatives    ROS:  Please see the history of present illness.   Otherwise, review of systems are positive for none.   All other systems are reviewed and negative.    PHYSICAL EXAM: VS:  BP 140/80 (BP Location: Left Arm, Patient Position: Sitting, Cuff Size: Normal)   Pulse 61   Ht 5\' 11"  (1.803 m)   Wt 197 lb 12.8 oz (89.7 kg)   BMI 27.59 kg/m  , BMI Body mass index is 27.59 kg/m. GENERAL:  Well appearing NECK:  No jugular venous distention, waveform within normal limits, carotid upstroke brisk and symmetric, no bruits, no thyromegaly LUNGS:  Clear to auscultation  bilaterally CHEST: Well-healed ICD scar, well-healed sternotomy scar HEART:  PMI not displaced or sustained,S1 and S2 within normal limits, no S3, no S4, no clicks, no rubs, 2 out of 6 apical systolic murmur radiating slightly at the aortic outflow tract, no diastolic murmurs ABD:  Flat, positive bowel sounds normal in frequency in pitch, no bruits, no rebound, no guarding, no midline pulsatile mass, no hepatomegaly, no splenomegaly EXT:  2 plus pulses throughout, no edema, no cyanosis no clubbing    EKG:  EKG is ordered today. Atrial paced rhythm, rate 61, axis within normal limits, first-degree AV block, no acute ST-T wave changes.   Recent Labs: 09/19/2020: ALT 25; BUN 20; Creatinine,  Ser 1.21; Hemoglobin 13.5; Platelets 165.0; Potassium 4.4; Sodium 141; TSH 4.11    Lipid Panel    Component Value Date/Time   CHOL 108 09/19/2020 0900   TRIG 54.0 09/19/2020 0900   HDL 34.20 (L) 09/19/2020 0900   CHOLHDL 3 09/19/2020 0900   VLDL 10.8 09/19/2020 0900   LDLCALC 63 09/19/2020 0900      Wt Readings from Last 3 Encounters:  04/02/21 197 lb 12.8 oz (89.7 kg)  02/20/21 198 lb (89.8 kg)  01/29/21 198 lb (89.8 kg)    Diagnostic Dominance: Left      Other studies Reviewed: Additional studies/ records that were reviewed today include: Extensive review of the EP and previous records since I saw him in 2019 Review of the above records demonstrates:     ASSESSMENT AND PLAN:  ATRIAL FIB:   CHADS VASC score is 3.  He tolerates anticoagulation.  He is now status post 2 ablations and various medications and at this point will be in persistent fibrillation with rate control.   PACEMAKER PLACEMENT:   He is up-to-date with follow-up.  I did review the most recent interrogation.  CAD:   He has had no new symptoms since his catheterization 2019.  No change in therapy.    CAROTID STENOSIS: He had an angiogram to follow-up pulsatile mass and there was no obstructive disease.  No change in  therapy.   DYSPNEA: This is at baseline.  He has good exercise tolerance.  He has had extensive work-up.  No change in therapy.    ABNORMAL ECHO: He has some mild aortic stenosis.  I do not think this is probably change based on his exam.  No change in therapy.   DYSLIPIDEMIA: LDL was 63 with an HDL of 34.  No change in therapy.  Current medicines are reviewed at length with the patient today.  The patient does not have concerns regarding medicines.  The following changes have been made:  None  Labs/ tests ordered today include:  None  Orders Placed This Encounter  Procedures  . EKG 12-Lead     Disposition:   FU with me in 12 months.    Signed, Minus Breeding, MD  04/02/2021 10:20 AM    [place]

## 2021-04-02 ENCOUNTER — Encounter: Payer: Self-pay | Admitting: Cardiology

## 2021-04-02 ENCOUNTER — Ambulatory Visit: Payer: PPO | Admitting: Cardiology

## 2021-04-02 ENCOUNTER — Other Ambulatory Visit: Payer: Self-pay

## 2021-04-02 VITALS — BP 140/80 | HR 61 | Ht 71.0 in | Wt 197.8 lb

## 2021-04-02 DIAGNOSIS — E785 Hyperlipidemia, unspecified: Secondary | ICD-10-CM

## 2021-04-02 DIAGNOSIS — I2510 Atherosclerotic heart disease of native coronary artery without angina pectoris: Secondary | ICD-10-CM | POA: Diagnosis not present

## 2021-04-02 DIAGNOSIS — I4819 Other persistent atrial fibrillation: Secondary | ICD-10-CM | POA: Diagnosis not present

## 2021-04-02 NOTE — Patient Instructions (Signed)
Medication Instructions:  Your Physician recommend you continue on your current medication as directed.    *If you need a refill on your cardiac medications before your next appointment, please call your pharmacy*   Lab Work: None  Testing/Procedures: None   Follow-Up: At [facility], you and your health needs are our priority.  As part of our continuing mission to provide you with exceptional heart care, we have created designated Provider Care Teams.  These Care Teams include your primary Cardiologist (physician) and Advanced Practice Providers (APPs -  Physician Assistants and Nurse Practitioners) who all work together to provide you with the care you need, when you need it.  We recommend signing up for the patient portal called "MyChart".  Sign up information is provided on this After Visit Summary.  MyChart is used to connect with patients for Virtual Visits (Telemedicine).  Patients are able to view lab/test results, encounter notes, upcoming appointments, etc.  Non-urgent messages can be sent to your provider as well.   To learn more about what you can do with MyChart, go to [email or web address].    Your next appointment:   1 year(s)  The format for your next appointment:   In Person  Provider:   James Hochrein, MD    

## 2021-04-03 DIAGNOSIS — H2511 Age-related nuclear cataract, right eye: Secondary | ICD-10-CM | POA: Diagnosis not present

## 2021-04-03 DIAGNOSIS — H25811 Combined forms of age-related cataract, right eye: Secondary | ICD-10-CM | POA: Diagnosis not present

## 2021-04-07 ENCOUNTER — Telehealth: Payer: Self-pay

## 2021-04-07 NOTE — Telephone Encounter (Signed)
Spoke with patient.  Advised of PM results.  Patient reports he has not had any symptoms such as lightheadedness or dizziness.  He was not aware of HR going as low as into the 30s.  In regards to exercise tolerance, he reports his exercise tolerance is not what it used to be but he not any worse than when he was last in the office and discussed this with Dr. Caryl Comes over a year ago.  He does feel that he doesn't have as much energy as he used to but again has related that t hs age and doesn't feel it is any worse than it had been at previous office visits.

## 2021-04-07 NOTE — Telephone Encounter (Signed)
-----   Message from Deboraha Sprang, MD sent at 04/05/2021 10:20 AM EDT ----- Remote reviewed. This remote is abnormal for persistent/permanent Afib with some slow HR and could you see how he is doing exercise capacity wise and also to make sure no symptoms of LH to suggest symptomatic bradycardia   Thanks SK

## 2021-04-09 NOTE — Progress Notes (Signed)
Remote pacemaker transmission.   

## 2021-04-10 DIAGNOSIS — H25812 Combined forms of age-related cataract, left eye: Secondary | ICD-10-CM | POA: Diagnosis not present

## 2021-04-10 DIAGNOSIS — H2512 Age-related nuclear cataract, left eye: Secondary | ICD-10-CM | POA: Diagnosis not present

## 2021-04-23 ENCOUNTER — Ambulatory Visit: Payer: PPO

## 2021-04-24 ENCOUNTER — Ambulatory Visit: Payer: PPO

## 2021-04-24 NOTE — Progress Notes (Signed)
   Covid-19 Vaccination Clinic  Name:  Maxime Beckner    MRN: [id number] DOB: 04/27/1946  04/24/2021  Mr. Orsino was observed post Covid-19 immunization for 15 minutes without incident. He was provided with Vaccine Information Sheet and instruction to access the V-Safe system.   Mr. Slawinski was instructed to call 911 with any severe reactions post vaccine: Marland Franson Difficulty breathing  . Swelling of face and throat  . A fast heartbeat  . A bad rash all over body  . Dizziness and weakness

## 2021-04-29 ENCOUNTER — Ambulatory Visit (INDEPENDENT_AMBULATORY_CARE_PROVIDER_SITE_OTHER): Payer: PPO | Admitting: Sports Medicine

## 2021-04-29 ENCOUNTER — Other Ambulatory Visit: Payer: Self-pay

## 2021-04-29 DIAGNOSIS — M67922 Unspecified disorder of synovium and tendon, left upper arm: Secondary | ICD-10-CM | POA: Insufficient documentation

## 2021-04-29 MED ORDER — NITROGLYCERIN 0.2 MG/HR TD PT24: MEDICATED_PATCH | TRANSDERMAL | 1 refills | Status: DC

## 2021-04-29 NOTE — Patient Instructions (Signed)

## 2021-04-29 NOTE — Assessment & Plan Note (Addendum)
4 months of left shoulder pain Suspect this is related to biceps tendon based on exam although US showed some calcific changes in supraspinatus as well  Plan  NTG protocol HEP with light weights + biceps exercises Repeat scan and exam in 6 weeks

## 2021-04-29 NOTE — Progress Notes (Signed)
Left Shoulder Pain  Has history of RT. RC repair by Dr Durward Fortes  Has seen Dr Durward Fortes for left shoulder pain since Feb. 22 Not really better even after CSI CT exam in March showed thick capsule; some mild AC OA RC and Biceps intact  He experiences mild pain with lateral elevation but not with full flexion and elevation Sharp pain with reaching back while shoulder elevated No real weakness  ROS No neck pain No radiculopathy  PE Fit appearing older M in NAD BP 106/86   Ht 5\' 11"  (1.803 m)   Wt 192 lb (87.1 kg)   BMI 26.78 kg/m  No flowsheet data found.  Shoulder: Left Inspection reveals no abnormalities, atrophy or asymmetry. Palpation is normal with no tenderness over AC joint or bicipital groove. ROM is full in all planes/ mild painful arc at 70 to 90 deg.. Rotator cuff strength normal throughout. No signs of impingement with negative Neer and Hawkin's tests, empty can. Speeds and Yergason's tests cause mild pain No labral pathology noted with negative Obrien's, negative clunk and good stability. Normal scapular function observed. No painful arc and no drop arm sign. No apprehension sign  With apprehension position he gets some pain  [place] of Left Shoulder Biceps tendon SAX shows some increased hypoechoic change and mild spurring Biceps long Axis shows significant spurs and calcification at the point where BT joins RC Biceps LAX shows some increased hypoechoic change but tendon intact  Subscapularis, infraspinatus and teres minor tendons normal AC with mild DJD changes Supraspinatus shows multiple stippled calcifications but no increase in doppler flow  Impression: Ultrasound suggest biceps tendinopathy with superior spurring and calcifications Calcific changes within the supraspinatus tendon  Ultrasound and interpretation by Wolfgang Phoenix. Oneida Alar, MD

## 2021-05-02 ENCOUNTER — Other Ambulatory Visit (HOSPITAL_BASED_OUTPATIENT_CLINIC_OR_DEPARTMENT_OTHER): Payer: Self-pay

## 2021-05-02 MED ORDER — PFIZER-BIONT COVID-19 VAC-TRIS 30 MCG/0.3ML IM SUSP
INTRAMUSCULAR | 0 refills | Status: DC
  Filled 2021-05-02: qty 0.3, 1d supply, fill #0

## 2021-06-01 ENCOUNTER — Other Ambulatory Visit: Payer: Self-pay | Admitting: Cardiology

## 2021-06-04 ENCOUNTER — Other Ambulatory Visit: Payer: Self-pay

## 2021-06-04 ENCOUNTER — Ambulatory Visit (INDEPENDENT_AMBULATORY_CARE_PROVIDER_SITE_OTHER): Payer: PPO | Admitting: Student

## 2021-06-04 ENCOUNTER — Encounter: Payer: Self-pay | Admitting: Student

## 2021-06-04 VITALS — BP 124/64 | HR 68 | Ht 71.0 in | Wt 200.0 lb

## 2021-06-04 DIAGNOSIS — I10 Essential (primary) hypertension: Secondary | ICD-10-CM | POA: Diagnosis not present

## 2021-06-04 DIAGNOSIS — I480 Paroxysmal atrial fibrillation: Secondary | ICD-10-CM | POA: Diagnosis not present

## 2021-06-04 DIAGNOSIS — Z950 Presence of cardiac pacemaker: Secondary | ICD-10-CM

## 2021-06-04 DIAGNOSIS — I495 Sick sinus syndrome: Secondary | ICD-10-CM | POA: Diagnosis not present

## 2021-06-04 DIAGNOSIS — E785 Hyperlipidemia, unspecified: Secondary | ICD-10-CM

## 2021-06-04 DIAGNOSIS — I2510 Atherosclerotic heart disease of native coronary artery without angina pectoris: Secondary | ICD-10-CM

## 2021-06-04 LAB — CUP PACEART INCLINIC DEVICE CHECK
Date Time Interrogation Session: 20220706105238
Implantable Lead Implant Date: 20130816
Implantable Lead Implant Date: 20130816
Implantable Lead Location: 753859
Implantable Lead Location: 753860
Implantable Lead Model: 5076
Implantable Lead Model: 5076
Implantable Pulse Generator Implant Date: 20130816
Lead Channel Impedance Value: 515 Ohm
Lead Channel Impedance Value: 873 Ohm
Lead Channel Pacing Threshold Amplitude: 0.9 V
Lead Channel Pacing Threshold Amplitude: 2 V
Lead Channel Pacing Threshold Pulse Width: 0.4 ms
Lead Channel Pacing Threshold Pulse Width: 0.7 ms
Lead Channel Sensing Intrinsic Amplitude: 0.9 mV
Lead Channel Sensing Intrinsic Amplitude: 20.6 mV
Lead Channel Setting Pacing Amplitude: 2 V
Lead Channel Setting Sensing Sensitivity: 2.5 mV
Pulse Gen Serial Number: 111255

## 2021-06-04 LAB — COMPREHENSIVE METABOLIC PANEL
ALT: 20 IU/L (ref 0–44)
AST: 22 IU/L (ref 0–40)
Albumin/Globulin Ratio: 2.2 (ref 1.2–2.2)
Albumin: 4.4 g/dL (ref 3.7–4.7)
Alkaline Phosphatase: 95 IU/L (ref 44–121)
BUN/Creatinine Ratio: 15 (ref 10–24)
BUN: 16 mg/dL (ref 8–27)
Bilirubin Total: 3 mg/dL — ABNORMAL HIGH (ref 0.0–1.2)
CO2: 23 mmol/L (ref 20–29)
Calcium: 9.4 mg/dL (ref 8.6–10.2)
Chloride: 104 mmol/L (ref 96–106)
Creatinine, Ser: 1.09 mg/dL (ref 0.76–1.27)
Globulin, Total: 2 g/dL (ref 1.5–4.5)
Glucose: 95 mg/dL (ref 65–99)
Potassium: 4.2 mmol/L (ref 3.5–5.2)
Sodium: 141 mmol/L (ref 134–144)
Total Protein: 6.4 g/dL (ref 6.0–8.5)
eGFR: 72 mL/min/{1.73_m2} (ref >59)

## 2021-06-04 LAB — CBC
Hematocrit: 40.8 % (ref 37.5–51.0)
Hemoglobin: 13.8 g/dL (ref 13.0–17.7)
MCH: 28.9 pg (ref 26.6–33.0)
MCHC: 33.8 g/dL (ref 31.5–35.7)
MCV: 86 fL (ref 79–97)
Platelets: 165 10*3/uL (ref 150–450)
RBC: 4.77 x10E6/uL (ref 4.14–5.80)
RDW: 12.9 % (ref 11.6–15.4)
WBC: 6.8 10*3/uL (ref 3.4–10.8)

## 2021-06-04 LAB — TSH: TSH: 7.68 u[IU]/mL — ABNORMAL HIGH (ref 0.450–4.500)

## 2021-06-04 MED ORDER — SPIRONOLACTONE 25 MG PO TABS: 12.5000 mg | ORAL_TABLET | Freq: Every day | ORAL | 3 refills | Status: DC

## 2021-06-04 NOTE — Progress Notes (Signed)
Electrophysiology Office Note Date: 06/04/2021  ID:  Russell Griffith, DOB Apr 10, 1946, MRN [id number]  PCP: Cassandria Anger, MD Primary Cardiologist: None Electrophysiologist: Virl Axe, MD  CC: Pacemaker follow-up  Russell Griffith is a 75 y.o. male with history of CAD (CABG 2000), PAFib s/p PVI ablation at [facility] (he thinks about 2008), and an AFlutter ablation with Dr. Lovena Le 2003, sinus node dysfunction with PPM, HTN, HLD.   Dr. Caryl Comes has layed out his hx well: Because of chronotropic incompetence, we made more aggressive his rate response.  This was associated with significant improvement. Had a history of exercise-induced polymorphic ventricular tachycardia, prompting  catheterization which demonstrated occlusion of 1 of his vein grafts but other conduits were open   His device was reprogrammed AAIR to prevent inappropriate ventricular pacing as we were seen R on T.  Since then he has done really quite well.  He has had scant palpitations.   With more symptoms atrial fibrillation he saw Dr. Greggory Brandy as opposed to [facility].  He underwent repeat ablation   Continues with significant AFib, assoc with fatigue and impaired exercise tolerance some palpitations.  I had on his behalf reached out to [facility] regarding repeat ablation.  He comes in today did consider repeat ablation and alternative strategies as well as consideration of having his ablation done here; there has been return of conduction on both left-sided veins and in the right carina.  He underwent repeat ablation and isolation of all 4 veins.  Notably there was no electrical activity on the roof.  For the left atrial ablation included a lesion along the posterior wall to form a standard box   A telehealth visit from 9/20 with Dr. Greggory Brandy is reviewed.  Recurrence of atrial fibrillation was noted.  Also severe right atrial enlargement was reported.   He underwent cardioversion; however, he reverted to atrial flutter sometime  thereafter.  Dr. Nikki Dom note also describes discussion of antiarrhythmic options including dronaderone, dofetilide and amiodarone if atrial arrhythmias were to recur.  Dronaderone was started at the A. fib clinic but intolerant because of nausea   Repeat telehealth visit 09/27/2019 by Dr. Greggory Brandy.  The patient was in atrial flutter but was unaware.  Heart rate was regular and exercise tolerance preserved.  Given his severe right atrial enlargement, he was not in favor of further ablation therapy.  Antiarrhythmic options were left open if symptoms were to recur.   He last saw Dr. Caryl Comes 02/12/20 He reported remaining fit without exertional intolerances or limitation The pt had noted pulsation of carotid artery and in d/w Dr. Alvester Chou planned for CTA >> this was done and without any significant vascular findings.  Patient is seen today for acute visit due to "error" on his pacemaker and fatigue.   Error on pacemaker was "abnormal" reading noted in may which discussed AF with slow VR at times. Pt states he has noticed more fatigue over the past month or two, and he is usually very active. Noticed it with moving his daughter and carrying boxes up and down stairs especially. No chest pain or palpitations. While riding the exercise bike his HR is unreliable due to the irregularity.  He describes what sounds like mild orthopnea, but relates it to large meals. No overt bendopnea.   AFIB history: Eliquis >> xarelto >> Eliquis Tikosyn 2012  Unclear when/why stopped, by Dr. Ron Parker note in 2014 stopped post ablation, doing well. 2020, Multaq, intolerant with nausea  PVI ablation at  [facility] (+/-  2008) AFlutter ablation, 2003, Dr. Lovena Le Repeat PVI ablation w/Dr. Rayann Heman 01/25/2019 08/2019 discussed rate control strategy going forward given asymptomatic  Device History: [employer] PPM implanted 06/2012 for SND  Past Medical History:  Diagnosis Date   Anxiety    Aortic stenosis    Mild, echo, April, 2014    BPH (benign prostatic hyperplasia)    CAD (coronary artery disease)    a. 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 s/p PVI at [facility]   Precancerous lesion    Forehead   Primary osteoarthritis of left knee    Mild   Prolapsed internal hemorrhoids, grade 3 08/12/2015   PVC's (premature ventricular contractions)    Tubular adenoma of colon    Past Surgical History:  Procedure Laterality Date   ABLATION     PVI at [place] 01/25/2019   Procedure: [place];  Surgeon: [name], MD;  Location: [place] [facility];  Service: Cardiovascular;  Laterality: N/A;   CARDIOVERSION N/A 10/14/2018   Procedure: CARDIOVERSION;  Surgeon: Fay Records, MD;  Location: [place];  Service: Cardiovascular;  Laterality: N/A;   CARDIOVERSION N/A 08/18/2019   Procedure: CARDIOVERSION;  Surgeon: Jerline Pain, MD;  Location: [place];  Service: Cardiovascular;  Laterality: N/A;   COLONOSCOPY     CORONARY ARTERY BYPASS GRAFT  2000   CABG X5   CYSTOSCOPY WITH RETROGRADE PYELOGRAM, URETEROSCOPY AND STENT PLACEMENT Bilateral 09/04/2020   Procedure: CYSTOSCOPY WITH BILATERAL RETROGRADE PYELOGRAM, RIGHT URETEROSCOPY AND RIGHT  STENT PLACEMENT;  Surgeon: Franchot Gallo, MD;  Location: [facility];  Service: Urology;  Laterality: Bilateral;   ESOPHAGOGASTRODUODENOSCOPY     HEAD & NECK SKIN LESION EXCISIONAL BIOPSY     HEMORRHOID BANDING     INTRAVASCULAR PRESSURE WIRE/FFR STUDY N/A 01/05/2018   Procedure: INTRAVASCULAR PRESSURE WIRE/FFR STUDY;  Surgeon: Sherren Mocha, MD;  Location: [place] [facility]  [facility];  Service: Cardiovascular;  Laterality: N/A;   LEFT HEART CATH AND CORS/GRAFTS ANGIOGRAPHY N/A 01/05/2018   Procedure: LEFT HEART CATH AND CORS/GRAFTS ANGIOGRAPHY;  Surgeon: Sherren Mocha, MD;  Location: [place] CV LAB;  Service: Cardiovascular;  Laterality: N/A;   PERMANENT PACEMAKER INSERTION N/A 07/15/2012   Procedure: PERMANENT PACEMAKER INSERTION;  Surgeon: Deboraha Sprang, MD;  Location: [facility] CATH LAB;  Service: Cardiovascular;  Laterality: N/A;   rotator cuff surgery Right 2017   TONSILLECTOMY  1956   WISDOM TOOTH EXTRACTION      Current Outpatient Medications  Medication Sig Dispense Refill   acetaminophen (TYLENOL) 500 MG tablet Take 1,000 mg by mouth every 6 (six) hours as needed for moderate pain or headache.     apixaban (ELIQUIS) 5 MG TABS tablet Take 1 tablet (5 mg total) by mouth 2 (two) times daily. 180 tablet 3   atorvastatin (LIPITOR) 20 MG tablet TAKE 1 TABLET ONCE DAILY AT 6 PM. 90 tablet 2   Cholecalciferol 25 MCG (1000 UT) capsule Take 1,000 Units by mouth daily with supper.  nitroGLYCERIN (NITRODUR - DOSED IN MG/24 HR) 0.2 mg/hr patch Use 1/4 patch daily to the affected area. 30 patch 1   NONFORMULARY OR COMPOUNDED ITEM Apply 1 application topically daily as needed (rash). Cetaphil + triamcinolone cream     pantoprazole (PROTONIX) 40 MG tablet TAKE 1 TABLET ONCE DAILY. 30 tablet 11   polyvinyl alcohol (LIQUIFILM TEARS) 1.4 % ophthalmic solution Place 1 drop into both eyes every 8 (eight) hours as needed for dry eyes.     ramipril (ALTACE) 2.5 MG capsule TAKE (1) CAPSULE DAILY. 90 capsule 3   spironolactone (ALDACTONE) 25 MG tablet Take 0.5 tablets (12.5 mg total) by mouth daily. 45 tablet 3   COVID-19 mRNA Vac-TriS, Pfizer, (PFIZER-BIONT COVID-19 VAC-TRIS) SUSP injection Inject into the muscle. (Patient not taking: Reported on 06/04/2021) 0.3 mL 0   Current Facility-Administered Medications  Medication Dose Route Frequency Provider Last Rate Last Admin   0.9 %   sodium chloride infusion  500 mL Intravenous Once Milus Banister, MD        Allergies:   Gretta Arab, Niacin and related, and Sulfonamide derivatives   Social History: Social History   Socioeconomic History   Marital status: Married    Spouse name: Not on file   Number of children: 2   Years of education: 18   Highest education level: Not on file  Occupational History   Occupation: Technical brewer: [employer]    Comment: Tour manager foundation  Tobacco Use   Smoking status: Never   Smokeless tobacco: Never  Scientific laboratory technician Use: Never used  Substance and Sexual Activity   Alcohol use: Yes    Comment: 1-2 glasses wine   Drug use: No   Sexual activity: Yes    Partners: Female  Other Topics Concern   Not on file  Social History Narrative   [employer] [place], [place].  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  2 daughters-'75, '79; 1 grandchild; step-daughter and step grandson.  Regular exercise-yes, runs 1.5-2 mi 4x/wk, also eliptical      Patient signed a Designated Party Release to allow his wife, Russell Griffith, to have access to his medical records/ information.   Social Determinants of Health   Financial Resource Strain: Low Risk    Difficulty of Paying Living Expenses: Not hard at all  Food Insecurity: No Food Insecurity   Worried About Charity fundraiser in the Last Year: Never true   [place] in the Last Year: Never true  Transportation Needs: No Transportation Needs   Lack of Transportation (Medical): No   Lack of Transportation (Non-Medical): No  Physical Activity: Sufficiently Active   Days of Exercise per Week: 7 days   Minutes of Exercise per Session: 60 min  Stress: Stress Concern Present   Feeling of Stress : Rather much  Social Connections: Socially Integrated   Frequency of Communication with Friends and Family: More than three times a week    Frequency of Social Gatherings with Friends and Family: More than three times a week   Attends Religious Services: More than 4 times per year   Active Member of [employer] or Organizations: Yes   Attends Music therapist: More than 4 times per year   Marital Status: Married  Human resources officer Violence: Not on file    Family History: Family History  Problem Relation Age of Onset   Coronary artery disease  Other 44   Diabetes Other    Multiple myeloma Mother    Diabetes Father    Renal Disease Father    Hyperlipidemia Other    Hypertension Other    Colon cancer Neg Hx    Esophageal cancer Neg Hx    Stomach cancer Neg Hx    Rectal cancer Neg Hx      Review of Systems: All other systems reviewed and are otherwise negative except as noted above.  Physical Exam: Vitals:   06/04/21 1006  BP: 124/64  Pulse: 68  SpO2: 98%  Weight: 200 lb (90.7 kg)  Height: _0  (1.803 m)     GEN- The patient is well appearing, alert and oriented x 3 today.   HEENT: normocephalic, atraumatic; sclera clear, conjunctiva pink; hearing intact; oropharynx clear; neck supple  Lungs- Clear to ausculation bilaterally, normal work of breathing.  No wheezes, rales, rhonchi Heart- Regular rate and rhythm, no murmurs, rubs or gallops  GI- soft, non-tender, non-distended, bowel sounds present  Extremities- no clubbing or cyanosis. No edema MS- no significant deformity or atrophy Skin- warm and dry, no rash or lesion; PPM pocket well healed Psych- euthymic mood, full affect Neuro- strength and sensation are intact  PPM Interrogation- reviewed in detail today,  See PACEART report  EKG:  EKG is not ordered today. The ekg ordered 04/02/21 shows A fib with controlled ventricular rates at 61 bpm  Recent Labs: 09/19/2020: ALT 25; BUN 20; Creatinine, Ser 1.21; Hemoglobin 13.5; Platelets 165.0; Potassium 4.4; Sodium 141; TSH 4.11   Wt Readings from Last 3 Encounters:  06/04/21 200 lb (90.7 kg)   04/29/21 192 lb (87.1 kg)  04/02/21 197 lb 12.8 oz (89.7 kg)     Other studies Reviewed: Additional studies/ records that were reviewed today include: Previous EP office notes, Previous remote checks, Most recent labwork.   Assessment and Plan:  1. 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 PAFib, atypical flutter S/p PVI ablations and Flutter ablation Continue eliquis for CHA2DS2-VASc of at least 3 Mostly unaware of AFib. Slow > Fast.  HRs mostly 50-70s, able to get up to 80-90s with activity. Histogram in the 30s appears to mostly be binning of irregular beats, and not sustained slow rates. Could obtain Zio Xt to clarify timing (nocturnal vs daytime) of bradycardia, if any sustained.   3. CAD On statin Denies symptoms apart from fatigue.  Update Echo.  QRS narrow.   4. Mild volume overload ? Component of diastolic CHF in setting of chronic AF Will start spironolactone 12.5 mg daily. Labs today and repeat BMET 10 days.   Current medicines are reviewed at length with the patient today.   The patient does not have concerns regarding his medicines.  The following changes were made today:  spiro added.  Labs/ tests ordered today include:  Orders Placed This Encounter  Procedures   Comprehensive metabolic panel   CBC   TSH   Basic metabolic panel   ECHOCARDIOGRAM COMPLETE    Disposition:   Follow up with EP APP in 2 Months    Signed, Annamaria Helling  06/04/2021 10:56 AM  [facility] [address] [place] [address] [phone] (office) [phone] (fax)

## 2021-06-04 NOTE — Patient Instructions (Signed)
Medication Instructions:  Your physician has recommended you make the following change in your medication:   START: Spironolactone 12.5mg  daily  *If you need a refill on your cardiac medications before your next appointment, please call your pharmacy*   Lab Work: TODAY: CMET, CBC, TSH 06/16/2021 - BMET  If you have labs (blood work) drawn today and your tests are completely normal, you will receive your results only by: [place] (if you have MyChart) OR A paper copy in the mail If you have any lab test that is abnormal or we need to change your treatment, we will call you to review the results.   Testing/Procedures: Your physician has requested that you have an echocardiogram. Echocardiography is a painless test that uses sound waves to create images of your heart. It provides your doctor with information about the size and shape of your heart and how well your heart's chambers and valves are working. This procedure takes approximately one hour. There are no restrictions for this procedure.   Follow-Up: At [facility], you and your health needs are our priority.  As part of our continuing mission to provide you with exceptional heart care, we have created designated Provider Care Teams.  These Care Teams include your primary Cardiologist (physician) and Advanced Practice Providers (APPs -  Physician Assistants and Nurse Practitioners) who all work together to provide you with the care you need, when you need it.    Your next appointment:   2 month(s)  The format for your next appointment:   In Person  Provider:   You may see Virl Axe, MD or one of the following Advanced Practice Providers on your designated Care Team:   Legrand Como "Jonni Sanger" [place], [place]

## 2021-06-09 ENCOUNTER — Other Ambulatory Visit: Payer: Self-pay

## 2021-06-09 ENCOUNTER — Ambulatory Visit (INDEPENDENT_AMBULATORY_CARE_PROVIDER_SITE_OTHER): Payer: PPO | Admitting: Internal Medicine

## 2021-06-09 ENCOUNTER — Encounter: Payer: Self-pay | Admitting: Internal Medicine

## 2021-06-09 VITALS — BP 128/80 | HR 63 | Temp 98.7°F | Ht 71.0 in | Wt 195.2 lb

## 2021-06-09 DIAGNOSIS — R7989 Other specified abnormal findings of blood chemistry: Secondary | ICD-10-CM | POA: Diagnosis not present

## 2021-06-09 DIAGNOSIS — Z91018 Allergy to other foods: Secondary | ICD-10-CM | POA: Diagnosis not present

## 2021-06-09 DIAGNOSIS — R5383 Other fatigue: Secondary | ICD-10-CM | POA: Diagnosis not present

## 2021-06-09 DIAGNOSIS — E785 Hyperlipidemia, unspecified: Secondary | ICD-10-CM | POA: Diagnosis not present

## 2021-06-09 DIAGNOSIS — R590 Localized enlarged lymph nodes: Secondary | ICD-10-CM

## 2021-06-09 DIAGNOSIS — I4819 Other persistent atrial fibrillation: Secondary | ICD-10-CM | POA: Diagnosis not present

## 2021-06-09 LAB — TSH: TSH: 3.98 u[IU]/mL (ref 0.35–5.50)

## 2021-06-09 LAB — T3, FREE: T3, Free: 3.6 pg/mL (ref 2.3–4.2)

## 2021-06-09 LAB — T4, FREE: Free T4: 0.95 ng/dL (ref 0.60–1.60)

## 2021-06-09 NOTE — Assessment & Plan Note (Signed)
No palpable adenopathy.  We may need to repeat neck CT scan

## 2021-06-09 NOTE — Assessment & Plan Note (Signed)
Continue with core 

## 2021-06-09 NOTE — Assessment & Plan Note (Addendum)
Likely multifactorial.  Treat insomnia.  Obtain free T4, free T3 Cut back on work

## 2021-06-09 NOTE — Assessment & Plan Note (Signed)
Abnormal TSH.  Will obtain free T4 and free T3.

## 2021-06-09 NOTE — Assessment & Plan Note (Signed)
Rate controlled.  Continue with Eliquis

## 2021-06-09 NOTE — Progress Notes (Signed)
Subjective:  Patient ID: Russell Griffith, male    DOB: 03-Nov-1946  Age: 75 y.o. MRN: [id number]  CC: Follow-up (F/U on labs " TSH")   HPI Russell Griffith presents for fatigue, abn TSH C/o insomnia - new: 5 h per night; stress.  And has been working full-time greater than 40 hours a week. C/o ?food allergies -hot flashes with cayenne pepper, etc.  Outpatient Medications Prior to Visit  Medication Sig Dispense Refill   acetaminophen (TYLENOL) 500 MG tablet Take 1,000 mg by mouth every 6 (six) hours as needed for moderate pain or headache.     apixaban (ELIQUIS) 5 MG TABS tablet Take 1 tablet (5 mg total) by mouth 2 (two) times daily. 180 tablet 3   atorvastatin (LIPITOR) 20 MG tablet TAKE 1 TABLET ONCE DAILY AT 6 PM. 90 tablet 2   Cholecalciferol 25 MCG (1000 UT) capsule Take 1,000 Units by mouth daily with supper.      nitroGLYCERIN (NITRODUR - DOSED IN MG/24 HR) 0.2 mg/hr patch Use 1/4 patch daily to the affected area. 30 patch 1   NONFORMULARY OR COMPOUNDED ITEM Apply 1 application topically daily as needed (rash). Cetaphil + triamcinolone cream     pantoprazole (PROTONIX) 40 MG tablet TAKE 1 TABLET ONCE DAILY. 30 tablet 11   polyvinyl alcohol (LIQUIFILM TEARS) 1.4 % ophthalmic solution Place 1 drop into both eyes every 8 (eight) hours as needed for dry eyes.     ramipril (ALTACE) 2.5 MG capsule TAKE (1) CAPSULE DAILY. 90 capsule 3   spironolactone (ALDACTONE) 25 MG tablet Take 0.5 tablets (12.5 mg total) by mouth daily. 45 tablet 3   COVID-19 mRNA Vac-TriS, Pfizer, (PFIZER-BIONT COVID-19 VAC-TRIS) SUSP injection Inject into the muscle. (Patient not taking: No sig reported) 0.3 mL 0   Facility-Administered Medications Prior to Visit  Medication Dose Route Frequency Provider Last Rate Last Admin   0.9 %  sodium chloride infusion  500 mL Intravenous Once Milus Banister, MD        ROS: Review of Systems  Constitutional:  Negative for appetite change, fatigue and unexpected  weight change.  HENT:  Negative for congestion, nosebleeds, sneezing, sore throat and trouble swallowing.   Eyes:  Negative for itching and visual disturbance.  Respiratory:  Negative for cough.   Cardiovascular:  Negative for chest pain, palpitations and leg swelling.  Gastrointestinal:  Negative for abdominal distention, blood in stool, diarrhea and nausea.  Genitourinary:  Negative for frequency and hematuria.  Musculoskeletal:  Negative for back pain, gait problem, joint swelling and neck pain.  Skin:  Negative for rash.  Neurological:  Negative for dizziness, tremors, speech difficulty and weakness.  Psychiatric/Behavioral:  Negative for agitation, dysphoric mood and sleep disturbance. The patient is not nervous/anxious.    Objective:  BP 128/80 (BP Location: Left Arm)   Pulse 63   Temp 98.7 F (37.1 C) (Oral)   Ht 5\' 11"  (1.803 m)   Wt 195 lb 3.2 oz (88.5 kg)   SpO2 97%   BMI 27.22 kg/m   BP Readings from Last 3 Encounters:  06/09/21 128/80  06/04/21 124/64  04/29/21 106/86    Wt Readings from Last 3 Encounters:  06/09/21 195 lb 3.2 oz (88.5 kg)  06/04/21 200 lb (90.7 kg)  04/29/21 192 lb (87.1 kg)    Physical Exam Constitutional:      General: He is not in acute distress.    Appearance: He is well-developed.     Comments: NAD  Eyes:     Conjunctiva/sclera: Conjunctivae normal.     Pupils: Pupils are equal, round, and reactive to light.  Neck:     Thyroid: No thyromegaly.     Vascular: No JVD.  Cardiovascular:     Rate and Rhythm: Normal rate. Rhythm irregular.     Heart sounds: Normal heart sounds. No murmur heard.   No friction rub. No gallop.  Pulmonary:     Effort: Pulmonary effort is normal. No respiratory distress.     Breath sounds: Normal breath sounds. No wheezing or rales.  Chest:     Chest wall: No tenderness.  Abdominal:     General: Bowel sounds are normal. There is no distension.     Palpations: Abdomen is soft. There is no mass.      Tenderness: There is no abdominal tenderness. There is no guarding or rebound.  Musculoskeletal:        General: No tenderness. Normal range of motion.     Cervical back: Normal range of motion.  Lymphadenopathy:     Cervical: No cervical adenopathy.  Skin:    General: Skin is warm and dry.     Findings: No rash.  Neurological:     Mental Status: He is alert and oriented to person, place, and time.     Cranial Nerves: No cranial nerve deficit.     Motor: No abnormal muscle tone.     Coordination: Coordination normal.     Gait: Gait normal.     Deep Tendon Reflexes: Reflexes are normal and symmetric.  Psychiatric:        Behavior: Behavior normal.        Thought Content: Thought content normal.        Judgment: Judgment normal.    Lab Results  Component Value Date   WBC 6.8 06/04/2021   HGB 13.8 06/04/2021   HCT 40.8 06/04/2021   PLT 165 06/04/2021   GLUCOSE 95 06/04/2021   CHOL 108 09/19/2020   TRIG 54.0 09/19/2020   HDL 34.20 (L) 09/19/2020   LDLCALC 63 09/19/2020   ALT 20 06/04/2021   AST 22 06/04/2021   NA 141 06/04/2021   K 4.2 06/04/2021   CL 104 06/04/2021   CREATININE 1.09 06/04/2021   BUN 16 06/04/2021   CO2 23 06/04/2021   TSH 7.680 (H) 06/04/2021   PSA 1.47 09/19/2020   INR 1.53 (H) 07/15/2012   HGBA1C 5.8 07/30/2015    CT SHOULDER LEFT W CONTRAST  Result Date: 02/18/2021 CLINICAL DATA:  Left shoulder pain for 6 months to 1 year. No recent injury. EXAM: CT ARTHROGRAPHY OF THE plain films left shoulder 01/09/2021. TECHNIQUE: Multidetector CT imaging was performed following the standard protocol after injection of dilute contrast into the joint. COMPARISON:  None. FINDINGS: Rotator cuff: Intact.  The rotator cuff appears somewhat thickened. Muscles: Normal without atrophy or focal lesion. Biceps long head: Intact. Acromioclavicular Joint: Mild-to-moderate osteoarthritis. The acromion is type 1. No contrast or fluid is seen in the subacromial/subdeltoid bursa.  Glenohumeral Joint: Appears normal. Labrum: Intact. Bones: No fracture or focal lesion. Other: Imaged lung parenchyma is clear. The patient is status post CABG with a pacing device in place. IMPRESSION: Mild to moderate appearing rotator cuff tendinopathy without tear. Mild to moderate acromioclavicular osteoarthritis. Electronically Signed   By: Inge Rise M.D.   On: 02/18/2021 08:52   Arthrogram  Result Date: 02/17/2021 CLINICAL DATA:  75 year old male with history of left shoulder pain. EXAM: LEFT SHOULDER INJECTION UNDER  FLUOROSCOPY TECHNIQUE: An appropriate skin entrance site was determined. The site was marked, prepped with Betadine, draped in the usual sterile fashion, and infiltrated locally with 1% lidocaine. 22 gauge spinal needle was advanced to the superomedial margin of the humeral head under intermittent fluoroscopy. 1 ml of Lidocaine injected easily. A mixture of 10 ml of Omnipaque 300 was then used to opacify the left shoulder capsule. No immediate complication. FLUOROSCOPY TIME:  Fluoroscopy Time:  18 seconds Radiation Exposure Index (if provided by the fluoroscopic device): 0.6 mGy Number of Acquired Spot Images: 2 FINDINGS: Adequate opacification of the left glenohumeral joint. IMPRESSION: Technically successful left shoulder injection for CT. Ruthann Cancer, MD [facility] Electronically Signed   By: Ruthann Cancer MD   On: 02/17/2021 14:35    Assessment & Plan:      Follow-up: No follow-ups on file.  Walker Kehr, MD

## 2021-06-09 NOTE — Patient Instructions (Addendum)
Melatonin Valerian root Cut back on work 30 h/wk

## 2021-06-12 LAB — ALLERGEN FOOD PROFILE SPECIFIC IGE
Allergen Apple, IgE: 0.12 kU/L — AB
Allergen Corn, IgE: 0.15 kU/L — AB
Allergen Tomato, IgE: 0.28 kU/L — AB
Chicken IgE: <0.1 kU/L
Codfish IgE: <0.1 kU/L
Egg White IgE: <0.1 kU/L
IgE (Immunoglobulin E), Serum: 27 IU/mL (ref 6–495)
Milk IgE: <0.1 kU/L
Orange: 0.17 kU/L — AB
Peanut IgE: 0.3 kU/L — AB
Shrimp IgE: <0.1 kU/L
Soybean IgE: 0.22 kU/L — AB
Tuna: <0.1 kU/L
Wheat IgE: 0.26 kU/L — AB

## 2021-06-15 ENCOUNTER — Other Ambulatory Visit: Payer: Self-pay | Admitting: Internal Medicine

## 2021-06-15 MED ORDER — LORATADINE 10 MG PO TABS
10.0000 mg | ORAL_TABLET | Freq: Every day | ORAL | 11 refills | Status: DC
Start: 2021-06-15 — End: 2022-01-30

## 2021-06-16 ENCOUNTER — Other Ambulatory Visit: Payer: PPO | Admitting: *Deleted

## 2021-06-16 ENCOUNTER — Other Ambulatory Visit: Payer: Self-pay

## 2021-06-16 DIAGNOSIS — I10 Essential (primary) hypertension: Secondary | ICD-10-CM

## 2021-06-16 DIAGNOSIS — Z950 Presence of cardiac pacemaker: Secondary | ICD-10-CM

## 2021-06-16 DIAGNOSIS — I2510 Atherosclerotic heart disease of native coronary artery without angina pectoris: Secondary | ICD-10-CM

## 2021-06-16 DIAGNOSIS — I495 Sick sinus syndrome: Secondary | ICD-10-CM

## 2021-06-16 DIAGNOSIS — I480 Paroxysmal atrial fibrillation: Secondary | ICD-10-CM | POA: Diagnosis not present

## 2021-06-16 DIAGNOSIS — E785 Hyperlipidemia, unspecified: Secondary | ICD-10-CM | POA: Diagnosis not present

## 2021-06-16 LAB — BASIC METABOLIC PANEL
BUN/Creatinine Ratio: 17 (ref 10–24)
BUN: 18 mg/dL (ref 8–27)
CO2: 23 mmol/L (ref 20–29)
Calcium: 9.5 mg/dL (ref 8.6–10.2)
Chloride: 100 mmol/L (ref 96–106)
Creatinine, Ser: 1.08 mg/dL (ref 0.76–1.27)
Glucose: 91 mg/dL (ref 65–99)
Potassium: 4.3 mmol/L (ref 3.5–5.2)
Sodium: 138 mmol/L (ref 134–144)
eGFR: 72 mL/min/{1.73_m2} (ref >59)

## 2021-06-18 ENCOUNTER — Other Ambulatory Visit: Payer: Self-pay | Admitting: Gastroenterology

## 2021-06-20 ENCOUNTER — Ambulatory Visit (INDEPENDENT_AMBULATORY_CARE_PROVIDER_SITE_OTHER): Payer: PPO

## 2021-06-20 DIAGNOSIS — Z950 Presence of cardiac pacemaker: Secondary | ICD-10-CM | POA: Diagnosis not present

## 2021-06-24 DIAGNOSIS — H4312 Vitreous hemorrhage, left eye: Secondary | ICD-10-CM | POA: Diagnosis not present

## 2021-06-24 LAB — CUP PACEART REMOTE DEVICE CHECK
Battery Remaining Longevity: 24 mo
Battery Remaining Percentage: 27 %
Brady Statistic RA Percent Paced: 0 %
Brady Statistic RV Percent Paced: 0 %
Date Time Interrogation Session: 20220725212400
Implantable Lead Implant Date: 20130816
Implantable Lead Implant Date: 20130816
Implantable Lead Location: 753859
Implantable Lead Location: 753860
Implantable Lead Model: 5076
Implantable Lead Model: 5076
Implantable Pulse Generator Implant Date: 20130816
Lead Channel Impedance Value: 517 Ohm
Lead Channel Impedance Value: 879 Ohm
Lead Channel Pacing Threshold Amplitude: 0.9 V
Lead Channel Pacing Threshold Pulse Width: 0.4 ms
Lead Channel Setting Pacing Amplitude: 2 V
Lead Channel Setting Sensing Sensitivity: 2.5 mV
Pulse Gen Serial Number: 111255

## 2021-06-25 DIAGNOSIS — H43812 Vitreous degeneration, left eye: Secondary | ICD-10-CM | POA: Diagnosis not present

## 2021-06-25 DIAGNOSIS — H21342 Primary cyst of pars plana, left eye: Secondary | ICD-10-CM | POA: Diagnosis not present

## 2021-06-25 DIAGNOSIS — H35372 Puckering of macula, left eye: Secondary | ICD-10-CM | POA: Diagnosis not present

## 2021-06-25 DIAGNOSIS — H4312 Vitreous hemorrhage, left eye: Secondary | ICD-10-CM | POA: Diagnosis not present

## 2021-06-25 DIAGNOSIS — H35013 Changes in retinal vascular appearance, bilateral: Secondary | ICD-10-CM | POA: Diagnosis not present

## 2021-06-30 ENCOUNTER — Other Ambulatory Visit (HOSPITAL_COMMUNITY): Payer: PPO

## 2021-07-08 ENCOUNTER — Ambulatory Visit (HOSPITAL_COMMUNITY): Payer: PPO | Attending: Internal Medicine

## 2021-07-08 ENCOUNTER — Other Ambulatory Visit: Payer: Self-pay

## 2021-07-08 DIAGNOSIS — I480 Paroxysmal atrial fibrillation: Secondary | ICD-10-CM | POA: Diagnosis not present

## 2021-07-08 LAB — ECHOCARDIOGRAM COMPLETE
AR max vel: 1.36 cm2
AV Area VTI: 1.37 cm2
AV Area mean vel: 1.51 cm2
AV Mean grad: 12 mmHg
AV Peak grad: 25.2 mmHg
Ao pk vel: 2.51 m/s
Area-P 1/2: 2.6 cm2
P 1/2 time: 626 msec
S' Lateral: 3 cm

## 2021-07-14 DIAGNOSIS — H21342 Primary cyst of pars plana, left eye: Secondary | ICD-10-CM | POA: Diagnosis not present

## 2021-07-14 DIAGNOSIS — H4312 Vitreous hemorrhage, left eye: Secondary | ICD-10-CM | POA: Diagnosis not present

## 2021-07-14 DIAGNOSIS — H43812 Vitreous degeneration, left eye: Secondary | ICD-10-CM | POA: Diagnosis not present

## 2021-07-14 DIAGNOSIS — H35432 Paving stone degeneration of retina, left eye: Secondary | ICD-10-CM | POA: Diagnosis not present

## 2021-07-14 NOTE — Progress Notes (Signed)
Remote pacemaker transmission.   

## 2021-07-27 ENCOUNTER — Emergency Department (HOSPITAL_BASED_OUTPATIENT_CLINIC_OR_DEPARTMENT_OTHER)
Admission: EM | Admit: 2021-07-27 | Discharge: 2021-07-27 | Disposition: A | Discharge status: Home / Self Care | Payer: PPO | Attending: Emergency Medicine | Admitting: Emergency Medicine

## 2021-07-27 ENCOUNTER — Encounter (HOSPITAL_BASED_OUTPATIENT_CLINIC_OR_DEPARTMENT_OTHER): Payer: Self-pay | Admitting: Obstetrics and Gynecology

## 2021-07-27 ENCOUNTER — Other Ambulatory Visit: Payer: Self-pay

## 2021-07-27 DIAGNOSIS — M436 Torticollis: Secondary | ICD-10-CM | POA: Diagnosis not present

## 2021-07-27 DIAGNOSIS — Z951 Presence of aortocoronary bypass graft: Secondary | ICD-10-CM | POA: Insufficient documentation

## 2021-07-27 DIAGNOSIS — I10 Essential (primary) hypertension: Secondary | ICD-10-CM | POA: Diagnosis not present

## 2021-07-27 DIAGNOSIS — Z7901 Long term (current) use of anticoagulants: Secondary | ICD-10-CM | POA: Diagnosis not present

## 2021-07-27 DIAGNOSIS — Z79899 Other long term (current) drug therapy: Secondary | ICD-10-CM | POA: Insufficient documentation

## 2021-07-27 DIAGNOSIS — M542 Cervicalgia: Secondary | ICD-10-CM | POA: Diagnosis not present

## 2021-07-27 DIAGNOSIS — I2510 Atherosclerotic heart disease of native coronary artery without angina pectoris: Secondary | ICD-10-CM | POA: Diagnosis not present

## 2021-07-27 LAB — COMPREHENSIVE METABOLIC PANEL
ALT: 16 U/L (ref 0–44)
AST: 17 U/L (ref 15–41)
Albumin: 4.4 g/dL (ref 3.5–5.0)
Alkaline Phosphatase: 71 U/L (ref 38–126)
Anion gap: 8 (ref 5–15)
BUN: 21 mg/dL (ref 8–23)
CO2: 26 mmol/L (ref 22–32)
Calcium: 9.6 mg/dL (ref 8.9–10.3)
Chloride: 104 mmol/L (ref 98–111)
Creatinine, Ser: 1.03 mg/dL (ref 0.61–1.24)
GFR, Estimated: >60 mL/min (ref >60)
Glucose, Bld: 92 mg/dL (ref 70–99)
Potassium: 4 mmol/L (ref 3.5–5.1)
Sodium: 138 mmol/L (ref 135–145)
Total Bilirubin: 3.5 mg/dL — ABNORMAL HIGH (ref 0.3–1.2)
Total Protein: 6.9 g/dL (ref 6.5–8.1)

## 2021-07-27 LAB — CBC WITH DIFFERENTIAL/PLATELET
Abs Immature Granulocytes: 0.01 10*3/uL (ref 0.00–0.07)
Basophils Absolute: 0 10*3/uL (ref 0.0–0.1)
Basophils Relative: 1 %
Eosinophils Absolute: 0.1 10*3/uL (ref 0.0–0.5)
Eosinophils Relative: 1 %
HCT: 41.5 % (ref 39.0–52.0)
Hemoglobin: 13.8 g/dL (ref 13.0–17.0)
Immature Granulocytes: 0 %
Lymphocytes Relative: 24 %
Lymphs Abs: 1.5 10*3/uL (ref 0.7–4.0)
MCH: 28.8 pg (ref 26.0–34.0)
MCHC: 33.3 g/dL (ref 30.0–36.0)
MCV: 86.5 fL (ref 80.0–100.0)
Monocytes Absolute: 1.2 10*3/uL — ABNORMAL HIGH (ref 0.1–1.0)
Monocytes Relative: 19 %
Neutro Abs: 3.7 10*3/uL (ref 1.7–7.7)
Neutrophils Relative %: 55 %
Platelets: 186 10*3/uL (ref 150–400)
RBC: 4.8 MIL/uL (ref 4.22–5.81)
RDW: 13.8 % (ref 11.5–15.5)
WBC: 6.5 10*3/uL (ref 4.0–10.5)
nRBC: 0 % (ref 0.0–0.2)

## 2021-07-27 MED ORDER — CYCLOBENZAPRINE HCL 5 MG PO TABS: 5.0000 mg | ORAL_TABLET | Freq: Three times a day (TID) | ORAL | 0 refills | Status: AC | PRN

## 2021-07-27 NOTE — ED Provider Notes (Signed)
[place] EMERGENCY DEPT Provider Note   CSN: [id number] Arrival date & time: 07/27/21  1133     History Chief Complaint  Patient presents with   Neck Pain    Russell Griffith is a 75 y.o. male.   Neck Pain Associated symptoms: headaches   Associated symptoms: no chest pain, no fever, no numbness and no weakness   Patient presents for right-sided neck pain over the past 5 days.  He denies any recent trauma.  Onset of symptoms was upon awakening.  Pain was on the right side of his neck.  He felt that he slept wrong and cause some muscle stiffness.  He has been treating his pain with Tylenol with relief.  Patient avoids NSAID use due to use of blood thinners.  He is on Eliquis for A. fib.  Over the past 5 days, patient has had waxing and waning right neck pain.  Pain is worsened with rightward rotation and neck extension.  It is also worsened with deep palpation.  He is also developed pain that radiates to the occipital ridge of his scalp.  Prior to arrival, seen in urgent care.  Sent to the ED for further evaluation.  Currently, patient endorses only mild right-sided neck pain and mild posterior headache.    Past Medical History:  Diagnosis Date   Anxiety    Aortic stenosis    Mild, echo, April, 2014   BPH (benign prostatic hyperplasia)    CAD (coronary artery disease)    a. 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)    Left  lung chest x-ray July, 2013   Persistent atrial fibrillation ([place])    a. s/p PVI at [facility]   Precancerous lesion    Forehead   Primary osteoarthritis of left knee    Mild   Prolapsed internal hemorrhoids, grade 3 08/12/2015   PVC's (premature ventricular  contractions)    Tubular adenoma of colon     Patient Active Problem List   Diagnosis Date Noted   Fatigue 06/09/2021   Biceps tendinopathy, left 04/29/2021   Pain in left shoulder 01/09/2021   Lymphadenopathy 09/23/2020   Abnormal LFTs 09/19/2020   Acute prostatitis with hematuria 08/16/2020   Gross hematuria 08/12/2020   Bloating 05/22/2020   Supraclavicular adenopathy 02/29/2020   Abnormal cardiovascular stress test 01/05/2018   Left shoulder pain 11/12/2016   Pain of left thumb 11/12/2016   Abdominal pain, epigastric 10/29/2015   Prolapsed internal hemorrhoids, grade 3 08/12/2015   IT band syndrome 07/23/2015   Partial tear of rotator cuff 12/04/2014   Well adult exam 07/03/2014   Jock itch 07/03/2014   Dyslipidemia 03/06/2014   Ejection fraction    Diverticulitis of colon without hemorrhage 08/31/2013   Mitral regurgitation    Tricuspid regurgitation    HX: anticoagulation    Diffuse anterior T-wave inversions 10/24/2012   PPM-[employer] 07/16/2012   Sinus node dysfunction/chronotropic incompetence/posttermination pausing 07/13/2012   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[place])    Shortness of breath    Aortic stenosis    Plantar fasciitis of right foot 04/04/2012   Numbness of toes    CAD (coronary artery  disease)    Hx of CABG    Drug therapy    HTN (hypertension)    Drug intolerance    Pulmonic stenosis    Aortic insufficiency    Chronic anticoagulation    Hypokalemia    A-fib ([facility])    QT prolongation on Tikosyn 03/26/2011   PVC's (premature ventricular contractions)    Carotid artery disease ([facility])    Elevated bilirubin    Abnormal TSH 04/01/2010   DERANGEMENT OF MENISCUS NOT ELSEWHERE CLASSIFIED 12/03/2009   Knee pain, right anterior 12/03/2009   BENIGN PROSTATIC HYPERTROPHY, WITH OBSTRUCTION 09/30/2009   Disorders of bursae and tendons in shoulder region, unspecified 08/22/2009   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   ADENOMATOUS COLONIC POLYP  03/06/2008   ANXIETY DEPRESSION 03/06/2008   ESOPHAGEAL STENOSIS 03/06/2008   BARRETTS ESOPHAGUS 03/06/2008   GASTRITIS, CHRONIC 03/06/2008   DUODENITIS 03/06/2008   PROSTATITIS, HX OF 03/06/2008   OSTEOARTHRITIS, KNEE, LEFT 11/04/2007   FOOT PAIN, LEFT 11/04/2007   Hx of colonic polyp 08/09/2007    Past Surgical History:  Procedure Laterality Date   ABLATION     PVI at [place] 01/25/2019   Procedure: [place];  Surgeon: [name], MD;  Location: [facility];  Service: Cardiovascular;  Laterality: N/A;   CARDIOVERSION N/A 10/14/2018   Procedure: CARDIOVERSION;  Surgeon: Fay Records, MD;  Location: [facility];  Service: Cardiovascular;  Laterality: N/A;   CARDIOVERSION N/A 08/18/2019   Procedure: CARDIOVERSION;  Surgeon: Jerline Pain, MD;  Location: [place];  Service: Cardiovascular;  Laterality: N/A;   COLONOSCOPY     CORONARY ARTERY BYPASS GRAFT  2000   CABG X5   CYSTOSCOPY WITH RETROGRADE PYELOGRAM, URETEROSCOPY AND STENT PLACEMENT Bilateral 09/04/2020   Procedure: CYSTOSCOPY WITH BILATERAL RETROGRADE PYELOGRAM, RIGHT URETEROSCOPY AND RIGHT  STENT PLACEMENT;  Surgeon: Franchot Gallo, MD;  Location: [facility];  Service: Urology;  Laterality: Bilateral;   ESOPHAGOGASTRODUODENOSCOPY     HEAD & NECK SKIN LESION EXCISIONAL BIOPSY     HEMORRHOID BANDING     INTRAVASCULAR PRESSURE WIRE/FFR STUDY N/A 01/05/2018   Procedure: INTRAVASCULAR PRESSURE WIRE/FFR STUDY;  Surgeon: Sherren Mocha, MD;  Location: [place] [facility];  Service: Cardiovascular;  Laterality: N/A;   LEFT HEART CATH AND CORS/GRAFTS ANGIOGRAPHY N/A 01/05/2018   Procedure: LEFT HEART CATH AND CORS/GRAFTS ANGIOGRAPHY;  Surgeon: Sherren Mocha, MD;  Location: [place] [facility];  Service: Cardiovascular;  Laterality: N/A;   PERMANENT PACEMAKER INSERTION N/A 07/15/2012   Procedure: PERMANENT PACEMAKER INSERTION;  Surgeon: Deboraha Sprang, MD;  Location: [facility]  CATH LAB;  Service: Cardiovascular;  Laterality: N/A;   rotator cuff surgery Right 2017   TONSILLECTOMY  1956   WISDOM TOOTH EXTRACTION         Family History  Problem Relation Age of Onset   Coronary artery disease Other 28   Diabetes Other    Multiple myeloma Mother    Diabetes Father    Renal Disease Father    Hyperlipidemia Other    Hypertension Other    Colon cancer Neg Hx    Esophageal cancer Neg Hx    Stomach cancer Neg Hx    Rectal cancer Neg Hx     Social History   Tobacco Use   Smoking status: Never   Smokeless tobacco: Never  Vaping Use   Vaping Use: Never used  Substance Use Topics   Alcohol use: Yes    Comment:  1-2 glasses wine   Drug use: No    Home Medications Prior to Admission medications   Medication Sig Start Date End Date Taking? Authorizing Provider  apixaban (ELIQUIS) 5 MG TABS tablet Take 1 tablet (5 mg total) by mouth 2 (two) times daily. 08/25/20  Yes Deboraha Sprang, MD  atorvastatin (LIPITOR) 20 MG tablet TAKE 1 TABLET ONCE DAILY AT 6 PM. 03/19/21  Yes Allred, Jeneen Rinks, MD  Cholecalciferol 25 MCG (1000 UT) capsule Take 1,000 Units by mouth daily with supper.    Yes [provider]  cyclobenzaprine (FLEXERIL) 5 MG tablet Take 1 tablet (5 mg total) by mouth 3 (three) times daily as needed for up to 3 days for muscle spasms. 07/27/21 07/30/21 Yes Godfrey Pick, MD  loratadine (CLARITIN) 10 MG tablet Take 1 tablet (10 mg total) by mouth daily. 06/15/21  Yes [name], Evie Lacks, MD  pantoprazole (PROTONIX) 40 MG tablet TAKE 1 TABLET ONCE DAILY. 06/18/21  Yes Milus Banister, MD  ramipril (ALTACE) 2.5 MG capsule TAKE (1) CAPSULE DAILY. 06/02/21  Yes Minus Breeding, MD  spironolactone (ALDACTONE) 25 MG tablet Take 0.5 tablets (12.5 mg total) by mouth daily. 06/04/21 09/02/21 Yes TillerySatira Mccallum, PA-C  acetaminophen (TYLENOL) 500 MG tablet Take 1,000 mg by mouth every 6 (six) hours as needed for moderate pain or headache.    [provider]  nitroGLYCERIN (NITRODUR - DOSED IN MG/24 HR) 0.2 mg/hr patch Use 1/4 patch daily to the affected area. 04/29/21   Stefanie Libel, MD  NONFORMULARY OR COMPOUNDED ITEM Apply 1 application topically daily as needed (rash). Cetaphil + triamcinolone cream    [provider]  polyvinyl alcohol (LIQUIFILM TEARS) 1.4 % ophthalmic solution Place 1 drop into both eyes every 8 (eight) hours as needed for dry eyes.    [provider]    Allergies    Cayenne, Niacin and related, and Sulfonamide derivatives  Review of Systems   Review of Systems  Constitutional:  Negative for activity change, appetite change, chills, fatigue and fever.  HENT:  Negative for ear pain, facial swelling, sore throat and trouble swallowing.   Eyes:  Negative for pain and visual disturbance.  Respiratory:  Negative for cough, chest tightness, shortness of breath and wheezing.   Cardiovascular:  Negative for chest pain and palpitations.  Gastrointestinal:  Negative for abdominal pain, nausea and vomiting.  Genitourinary:  Negative for dysuria, flank pain and hematuria.  Musculoskeletal:  Positive for neck pain. Negative for arthralgias, back pain and joint swelling.  Skin:  Negative for color change, rash and wound.  Neurological:  Positive for headaches. Negative for dizziness, tremors, seizures, syncope, facial asymmetry, weakness, light-headedness and numbness.  Hematological:  Bruises/bleeds easily (On Eliquis.).  Psychiatric/Behavioral:  Negative for confusion and decreased concentration.   All other systems reviewed and are negative.  Physical Exam Updated Vital Signs BP (!) 153/67   Pulse (!) 58   Temp 98.1 F (36.7 C) (Oral)   Resp 16   SpO2 95%   Physical Exam Vitals and nursing note reviewed.  Constitutional:      General: He is not in acute distress.    Appearance: Normal appearance. He is well-developed. He is not ill-appearing, toxic-appearing or diaphoretic.  HENT:     Head:  Normocephalic and atraumatic.     Right Ear: External ear normal.     Left Ear: External ear normal.     Nose: Nose normal.     Mouth/Throat:  Mouth: Mucous membranes are moist.     Pharynx: Oropharynx is clear.  Eyes:     General: No visual field deficit.    Extraocular Movements: Extraocular movements intact.     Conjunctiva/sclera: Conjunctivae normal.  Cardiovascular:     Rate and Rhythm: Normal rate and regular rhythm.     Heart sounds: No murmur heard. Pulmonary:     Effort: Pulmonary effort is normal. No respiratory distress.     Breath sounds: Normal breath sounds.  Abdominal:     Palpations: Abdomen is soft.     Tenderness: There is no abdominal tenderness.  Musculoskeletal:        General: No swelling or deformity. Normal range of motion.     Cervical back: Normal range of motion and neck supple. Tenderness (Right neck muscles) present.  Skin:    General: Skin is warm and dry.  Neurological:     General: No focal deficit present.     Mental Status: He is alert and oriented to person, place, and time.     Cranial Nerves: Cranial nerves are intact. No cranial nerve deficit, dysarthria or facial asymmetry.     Sensory: Sensation is intact. No sensory deficit.     Motor: Motor function is intact. No weakness, abnormal muscle tone or pronator drift.     Coordination: Coordination is intact. Coordination normal. Finger-Nose-Finger Test normal.     Gait: Gait is intact. Gait normal.    ED Results / Procedures / Treatments   Labs (all labs ordered are listed, but only abnormal results are displayed) Labs Reviewed  CBC WITH DIFFERENTIAL/PLATELET - Abnormal; Notable for the following components:      Result Value   Monocytes Absolute 1.2 (*)    All other components within normal limits  COMPREHENSIVE METABOLIC PANEL - Abnormal; Notable for the following components:   Total Bilirubin 3.5 (*)    All other components within normal limits    EKG None  Radiology No  results found.  Procedures Procedures   Medications Ordered in ED Medications - No data to display  ED Course  I have reviewed the triage vital signs and the nursing notes.  Pertinent labs & imaging results that were available during my care of the patient were reviewed by me and considered in my medical decision making (see chart for details).    MDM Rules/Calculators/A&P                          Patient presents for right-sided neck pain over the past 5 days.  During this time, pain has radiated to the occipital ridge of his posterior scalp.  He has been treating his pain at home with Tylenol and has had relief of his pain.  Over the past 5 days, pain has not worsened, however, it has persisted.  He initially presented to urgent care who sent him to the ED for further evaluation.  On arrival, patient is well-appearing.  Vital signs are normal.  Patient has no focal neurologic deficits.  He does have tenderness to palpation on the right neck musculature.  Additionally, he has tenderness to palpation along his occipital ridge, consistent with musculoskeletal etiology and occipital neuralgia.  Given his reassuring exam, do not feel the patient needs advanced imaging at this time.  Patient was agreeable to this and states that he was trying to avoid it.  Patient was advised to continue treatment of pain with Tylenol as needed.  Additionally,  he was prescribed low-dose muscle relaxer to be taken as needed for his symptoms of right-sided neck muscle pain and stiffness.  He was advised to return to the ED for any new or worsening symptoms.  He was discharged in good condition.  Final Clinical Impression(s) / ED Diagnoses Final diagnoses:  Neck pain    Rx / DC Orders ED Discharge Orders          Ordered    cyclobenzaprine (FLEXERIL) 5 MG tablet  3 times daily PRN        07/27/21 1508             Godfrey Pick, MD 07/28/21 1814

## 2021-07-27 NOTE — ED Triage Notes (Signed)
Patient sent to ER from [facility] to get CT of neck

## 2021-07-27 NOTE — ED Notes (Signed)
Wednesday patient woke up with stiffnes right side neck. Worse on Friday. And has progressively gotten worse. Tylenol helps the pain ,but still persists.

## 2021-07-27 NOTE — ED Notes (Signed)
Pt refused for me to take his vital signs, was ready to go. Dc instructions and prescription reviewed with patient and stated understanding.

## 2021-08-05 NOTE — Progress Notes (Signed)
Electrophysiology Office Note Date: 08/06/2021  ID:  Russell Griffith, DOB 03/10/1946, MRN [id number]  PCP: Cassandria Anger, MD Primary Cardiologist: None Electrophysiologist: Virl Axe, MD   CC: Pacemaker follow-up  Russell Griffith is a 75 y.o. male seen today for Virl Axe, MD for routine electrophysiology followup.  Since last being seen in our clinic the patient reports doing well. He felt better after starting spiro, with less SOB. He had a persistent "crick in his neck" for several days and was seen in ED. Work up unremarkable. he denies chest pain, palpitations, PND, orthopnea, nausea, vomiting, dizziness, syncope, edema, weight gain, or early satiety.  AFIB history: Eliquis >> xarelto >> Eliquis Tikosyn 2012  Unclear when/why stopped, by Dr. Ron Parker note in 2014 stopped post ablation, doing well. 2020, Multaq, intolerant with nausea  PV[facility] ablation at  [facility] (+/- 2008) AFlutter ablation, 2003, Dr. Lovena Le Repeat PVI ablation w/Dr. Rayann Heman 01/25/2019 08/2019 discussed rate control strategy going forward given asymptomatic   Device History: [employer] PPM implanted 06/2012 for SND  Past Medical History:  Diagnosis Date   Anxiety    Aortic stenosis    Mild, echo, April, 2014   BPH (benign prostatic hyperplasia)    CAD (coronary artery disease)    a. 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 s/p PVI at [facility]   Precancerous lesion    Forehead   Primary osteoarthritis of left knee    Mild   Prolapsed internal hemorrhoids, grade 3 08/12/2015   PVC's (premature ventricular contractions)     Tubular adenoma of colon    Past Surgical History:  Procedure Laterality Date   ABLATION     PVI at [facility] 01/25/2019   Procedure: [place];  Surgeon: [name], MD;  Location: [place] [facility];  Service: Cardiovascular;  Laterality: N/A;   CARDIOVERSION N/A 10/14/2018   Procedure: CARDIOVERSION;  Surgeon: Fay Records, MD;  Location: [place];  Service: Cardiovascular;  Laterality: N/A;   CARDIOVERSION N/A 08/18/2019   Procedure: CARDIOVERSION;  Surgeon: Jerline Pain, MD;  Location: [place];  Service: Cardiovascular;  Laterality: N/A;   COLONOSCOPY     CORONARY ARTERY BYPASS GRAFT  2000   CABG X5   CYSTOSCOPY WITH RETROGRADE PYELOGRAM, URETEROSCOPY AND STENT PLACEMENT Bilateral 09/04/2020   Procedure: CYSTOSCOPY WITH BILATERAL RETROGRADE PYELOGRAM, RIGHT URETEROSCOPY AND RIGHT  STENT PLACEMENT;  Surgeon: Franchot Gallo, MD;  Location: [facility];  Service: Urology;  Laterality: Bilateral;   ESOPHAGOGASTRODUODENOSCOPY     HEAD & NECK SKIN LESION EXCISIONAL BIOPSY     HEMORRHOID BANDING     INTRAVASCULAR PRESSURE WIRE/FFR STUDY N/A 01/05/2018   Procedure: INTRAVASCULAR PRESSURE WIRE/FFR STUDY;  Surgeon: Sherren Mocha, MD;  Location: [place] [facility];  Service: Cardiovascular;  Laterality: N/A;   LEFT HEART CATH AND CORS/GRAFTS ANGIOGRAPHY N/A 01/05/2018   Procedure: LEFT HEART CATH AND CORS/GRAFTS ANGIOGRAPHY;  Surgeon: Sherren Mocha, MD;  Location: [place] [facility];  Service:  Cardiovascular;  Laterality: N/A;   PERMANENT PACEMAKER INSERTION N/A 07/15/2012   Procedure: PERMANENT PACEMAKER INSERTION;  Surgeon: Deboraha Sprang, MD;  Location: [facility];  Service: Cardiovascular;  Laterality: N/A;   rotator cuff surgery Right 2017   TONSILLECTOMY  1956   WISDOM TOOTH EXTRACTION      Current Outpatient Medications  Medication Sig Dispense Refill   acetaminophen (TYLENOL) 500 MG tablet Take 1,000 mg by mouth every 6  (six) hours as needed for moderate pain or headache.     apixaban (ELIQUIS) 5 MG TABS tablet Take 1 tablet (5 mg total) by mouth 2 (two) times daily. 180 tablet 3   atorvastatin (LIPITOR) 20 MG tablet TAKE 1 TABLET ONCE DAILY AT 6 PM. 90 tablet 2   Cholecalciferol 25 MCG (1000 UT) capsule Take 1,000 Units by mouth daily with supper.      loratadine (CLARITIN) 10 MG tablet Take 1 tablet (10 mg total) by mouth daily. 30 tablet 11   moxifloxacin (VIGAMOX) 0.5 % ophthalmic solution as needed.     nitroGLYCERIN (NITRODUR - DOSED IN MG/24 HR) 0.2 mg/hr patch Use 1/4 patch daily to the affected area. 30 patch 1   NONFORMULARY OR COMPOUNDED ITEM Apply 1 application topically daily as needed (rash). Cetaphil + triamcinolone cream     pantoprazole (PROTONIX) 40 MG tablet TAKE 1 TABLET ONCE DAILY. 90 tablet 3   polyvinyl alcohol (LIQUIFILM TEARS) 1.4 % ophthalmic solution Place 1 drop into both eyes every 8 (eight) hours as needed for dry eyes.     prednisoLONE acetate (PRED FORTE) 1 % ophthalmic suspension Place 1 drop into the left eye as needed.     ramipril (ALTACE) 2.5 MG capsule TAKE (1) CAPSULE DAILY. 90 capsule 3   triamcinolone cream (KENALOG) 0.1 % as needed.     spironolactone (ALDACTONE) 25 MG tablet Take 1 tablet (25 mg total) by mouth daily. 90 tablet 3   Current Facility-Administered Medications  Medication Dose Route Frequency Provider Last Rate Last Admin   0.9 %  sodium chloride infusion  500 mL Intravenous Once Milus Banister, MD        Allergies:   Gretta Arab, Niacin and related, and Sulfonamide derivatives   Social History: Social History   Socioeconomic History   Marital status: Married    Spouse name: Not on file   Number of children: 2   Years of education: 18   Highest education level: Not on file  Occupational History   Occupation: Technical brewer: [employer]    Comment: Tour manager foundation  Tobacco Use   Smoking status: Never   Smokeless  tobacco: Never  Scientific laboratory technician Use: Never used  Substance and Sexual Activity   Alcohol use: Yes    Comment: 1-2 glasses wine   Drug use: No   Sexual activity: Yes    Partners: Female  Other Topics Concern   Not on file  Social History Narrative   [employer] [place], [place].  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  2 daughters-'75, '79; 1 grandchild; step-daughter and step grandson.  Regular exercise-yes, runs 1.5-2 mi 4x/wk, also eliptical      Patient signed a Designated Party Release to allow his wife, Jawan Chavarria, to have access to his medical records/ information.   Social Determinants of Health   Financial Resource Strain: Low Risk    Difficulty of Paying Living Expenses: Not  hard at all  Food Insecurity: No Food Insecurity   Worried About Charity fundraiser in the Last Year: Never true   Ran Out of Food in the Last Year: Never true  Transportation Needs: No Transportation Needs   Lack of Transportation (Medical): No   Lack of Transportation (Non-Medical): No  Physical Activity: Sufficiently Active   Days of Exercise per Week: 7 days   Minutes of Exercise per Session: 60 min  Stress: Stress Concern Present   Feeling of Stress : Rather much  Social Connections: Socially Integrated   Frequency of Communication with Friends and Family: More than three times a week   Frequency of Social Gatherings with Friends and Family: More than three times a week   Attends Religious Services: More than 4 times per year   Active Member of [employer] or Organizations: Yes   Attends Music therapist: More than 4 times per year   Marital Status: Married  Human resources officer Violence: Not on file    Family History: Family History  Problem Relation Age of Onset   Coronary artery disease Other 79   Diabetes Other    Multiple myeloma Mother    Diabetes Father    Renal Disease Father     Hyperlipidemia Other    Hypertension Other    Colon cancer Neg Hx    Esophageal cancer Neg Hx    Stomach cancer Neg Hx    Rectal cancer Neg Hx      Review of Systems: All other systems reviewed and are otherwise negative except as noted above.  Physical Exam: Vitals:   08/06/21 1011  BP: 120/68  Pulse: 60  SpO2: 98%  Weight: 199 lb 9.6 oz (90.5 kg)  Height: _0  (1.803 m)     GEN- The patient is well appearing, alert and oriented x 3 today.   HEENT: normocephalic, atraumatic; sclera clear, conjunctiva pink; hearing intact; oropharynx clear; neck supple  Lungs- Clear to ausculation bilaterally, normal work of breathing.  No wheezes, rales, rhonchi Heart- Regular rate and rhythm, no murmurs, rubs or gallops  GI- soft, non-tender, non-distended, bowel sounds present  Extremities- no clubbing or cyanosis. No edema MS- no significant deformity or atrophy Skin- warm and dry, no rash or lesion; PPM pocket well healed Psych- euthymic mood, full affect Neuro- strength and sensation are intact  PPM Interrogation- reviewed in detail today,  See PACEART report  EKG:  EKG is not ordered today.  Recent Labs: 06/09/2021: TSH 3.98 07/27/2021: ALT 16; BUN 21; Creatinine, Ser 1.03; Hemoglobin 13.8; Platelets 186; Potassium 4.0; Sodium 138   Wt Readings from Last 3 Encounters:  08/06/21 199 lb 9.6 oz (90.5 kg)  06/09/21 195 lb 3.2 oz (88.5 kg)  06/04/21 200 lb (90.7 kg)     Other studies Reviewed: Additional studies/ records that were reviewed today include: Previous EP office notes, Previous remote checks, Most recent labwork.   Assessment and Plan:  1. 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 PAFib, atypical flutter S/p PVI ablations and Flutter ablation Continue eliquis for CHA2DS2-VASc of at least 3 Mostly unaware of AFib. Slow > Fast.  HRs mostly 50-70s, able to get up to 80-90s with activity. Histogram in the 30s  appears to mostly be binning of irregular beats, and not sustained slow rates. Could obtain Zio Xt to clarify timing (nocturnal vs daytime) of bradycardia, if any sustained.   I wonder if overall  his HRs aren't trending down. Currently he is asymptomatic. Follow closely. He is currently set to [place]. If needs RV pacing more frequently, will need to change settings and consider RV lead revision at gen change with Threshold ~2.0 V at baseline.    3. CAD Continue stain statin Denies symptoms apart from fatigue.  Echo 07/08/21 LVEF 65%, trivial MR and AI that is stable from previous. QRS narrow.    4. Chronic diastolic CHF Increase spironolactone to 25 mg daily. CMET stable 07/27/21  Current medicines are reviewed at length with the patient today.    Labs/ tests ordered today include:  Orders Placed This Encounter  Procedures   Basic metabolic panel   CUP PACEART INCLINIC DEVICE CHECK     Disposition:   Follow up with Dr. Caryl Comes in 6 Months    Signed, Annamaria Helling  08/06/2021 10:44 AM  [facility] [address] [place] [address] [phone] (office) [phone] (fax)

## 2021-08-06 ENCOUNTER — Other Ambulatory Visit: Payer: Self-pay

## 2021-08-06 ENCOUNTER — Encounter: Payer: Self-pay | Admitting: Student

## 2021-08-06 ENCOUNTER — Ambulatory Visit: Payer: PPO | Admitting: Student

## 2021-08-06 VITALS — BP 120/68 | HR 60 | Ht 71.0 in | Wt 199.6 lb

## 2021-08-06 DIAGNOSIS — I480 Paroxysmal atrial fibrillation: Secondary | ICD-10-CM

## 2021-08-06 DIAGNOSIS — I2510 Atherosclerotic heart disease of native coronary artery without angina pectoris: Secondary | ICD-10-CM | POA: Diagnosis not present

## 2021-08-06 DIAGNOSIS — Z950 Presence of cardiac pacemaker: Secondary | ICD-10-CM | POA: Diagnosis not present

## 2021-08-06 DIAGNOSIS — I10 Essential (primary) hypertension: Secondary | ICD-10-CM

## 2021-08-06 LAB — CUP PACEART INCLINIC DEVICE CHECK
Date Time Interrogation Session: 20220907103934
Implantable Lead Implant Date: 20130816
Implantable Lead Implant Date: 20130816
Implantable Lead Location: 753859
Implantable Lead Location: 753860
Implantable Lead Model: 5076
Implantable Lead Model: 5076
Implantable Pulse Generator Implant Date: 20130816
Lead Channel Impedance Value: 525 Ohm
Lead Channel Impedance Value: 920 Ohm
Lead Channel Pacing Threshold Amplitude: 0.9 V
Lead Channel Pacing Threshold Amplitude: 2 V
Lead Channel Pacing Threshold Pulse Width: 0.4 ms
Lead Channel Pacing Threshold Pulse Width: 0.7 ms
Lead Channel Sensing Intrinsic Amplitude: 0.5 mV
Lead Channel Setting Pacing Amplitude: 2 V
Lead Channel Setting Sensing Sensitivity: 2.5 mV
Pulse Gen Serial Number: 111255

## 2021-08-06 MED ORDER — SPIRONOLACTONE 25 MG PO TABS: 25.0000 mg | ORAL_TABLET | Freq: Every day | ORAL | 3 refills | Status: DC

## 2021-08-06 NOTE — Patient Instructions (Addendum)
Medication Instructions:  Your physician has recommended you make the following change in your medication:   INCREASE: Spironolactone to '25mg'$  daily  *If you need a refill on your cardiac medications before your next appointment, please call your pharmacy*   Lab Work: BMET on 08/18/2021  If you have labs (blood work) drawn today and your tests are completely normal, you will receive your results only by: [place] (if you have MyChart) OR A paper copy in the mail If you have any lab test that is abnormal or we need to change your treatment, we will call you to review the results.   Follow-Up: At [facility], you and your health needs are our priority.  As part of our continuing mission to provide you with exceptional heart care, we have created designated Provider Care Teams.  These Care Teams include your primary Cardiologist (physician) and Advanced Practice Providers (APPs -  Physician Assistants and Nurse Practitioners) who all work together to provide you with the care you need, when you need it.   Your next appointment:   6 month(s)  The format for your next appointment:   In Person  Provider:   You may see Virl Axe, MD or one of the following Advanced Practice Providers on your designated Care Team:   Tommye Standard, [place] "[facility]" [place], [place]

## 2021-08-18 ENCOUNTER — Other Ambulatory Visit: Payer: Self-pay

## 2021-08-18 ENCOUNTER — Other Ambulatory Visit: Payer: PPO | Admitting: *Deleted

## 2021-08-18 DIAGNOSIS — Z950 Presence of cardiac pacemaker: Secondary | ICD-10-CM

## 2021-08-18 DIAGNOSIS — I2510 Atherosclerotic heart disease of native coronary artery without angina pectoris: Secondary | ICD-10-CM | POA: Diagnosis not present

## 2021-08-18 DIAGNOSIS — I480 Paroxysmal atrial fibrillation: Secondary | ICD-10-CM | POA: Diagnosis not present

## 2021-08-18 DIAGNOSIS — I10 Essential (primary) hypertension: Secondary | ICD-10-CM | POA: Diagnosis not present

## 2021-08-18 LAB — BASIC METABOLIC PANEL
BUN/Creatinine Ratio: 19 (ref 10–24)
BUN: 22 mg/dL (ref 8–27)
CO2: 22 mmol/L (ref 20–29)
Calcium: 9.2 mg/dL (ref 8.6–10.2)
Chloride: 101 mmol/L (ref 96–106)
Creatinine, Ser: 1.15 mg/dL (ref 0.76–1.27)
Glucose: 90 mg/dL (ref 65–99)
Potassium: 4.4 mmol/L (ref 3.5–5.2)
Sodium: 137 mmol/L (ref 134–144)
eGFR: 67 mL/min/{1.73_m2} (ref >59)

## 2021-08-24 ENCOUNTER — Other Ambulatory Visit: Payer: Self-pay | Admitting: Internal Medicine

## 2021-08-25 NOTE — Telephone Encounter (Signed)
Eliquis 5 mg refill request received. Patient is 75 years old, weight-90.5 kg, Crea- 1.15 on 08/18/21, Diagnosis-afib, and last seen by Vivi Martens on 08/06/21. Dose is appropriate based on dosing criteria. Will send in refill to requested pharmacy.

## 2021-09-18 ENCOUNTER — Ambulatory Visit: Payer: PPO | Admitting: Sports Medicine

## 2021-09-18 VITALS — BP 132/76 | Ht 71.0 in | Wt 190.0 lb

## 2021-09-18 DIAGNOSIS — M67922 Unspecified disorder of synovium and tendon, left upper arm: Secondary | ICD-10-CM | POA: Diagnosis not present

## 2021-09-18 IMAGING — XA DG FLUORO GUIDE NDL PLC/BX
2 series · 2 of 2 positions shown · IV contrast (omnipaque)
Comparison: none

CLINICAL DATA: 75-year-old male with history of left shoulder pain.

EXAM:
LEFT SHOULDER INJECTION UNDER FLUOROSCOPY
TECHNIQUE: An appropriate skin entrance site was determined. The site was
marked, prepped with Betadine, draped in the usual sterile fashion,
and infiltrated locally with 1% lidocaine. 22 gauge spinal needle
was advanced to the superomedial margin of the humeral head under
intermittent fluoroscopy. 1 ml of Lidocaine injected easily. A
mixture of 10 ml of Omnipaque 300 was then used to opacify the left
shoulder capsule. No immediate complication.
FLUOROSCOPY TIME:  Fluoroscopy Time:  18 seconds
Radiation Exposure Index (if provided by the fluoroscopic device):
0.6 mGy
Number of Acquired Spot Images: 2

[Series 1: ortho standard · 1 of 1 slices shown (1 of 2)]
[im 1/1]
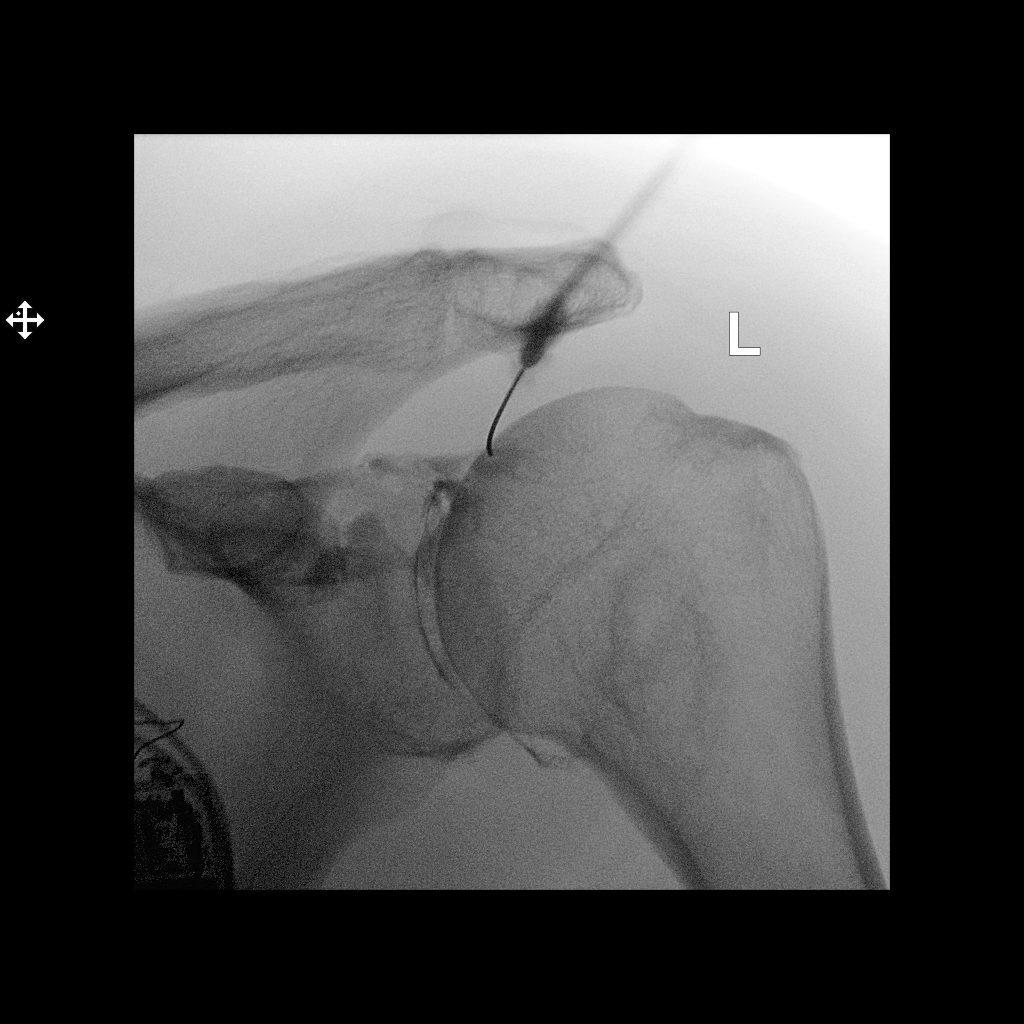

[Series 2: ortho standard · 1 of 1 slices shown (2 of 2)]
[im 1/1]
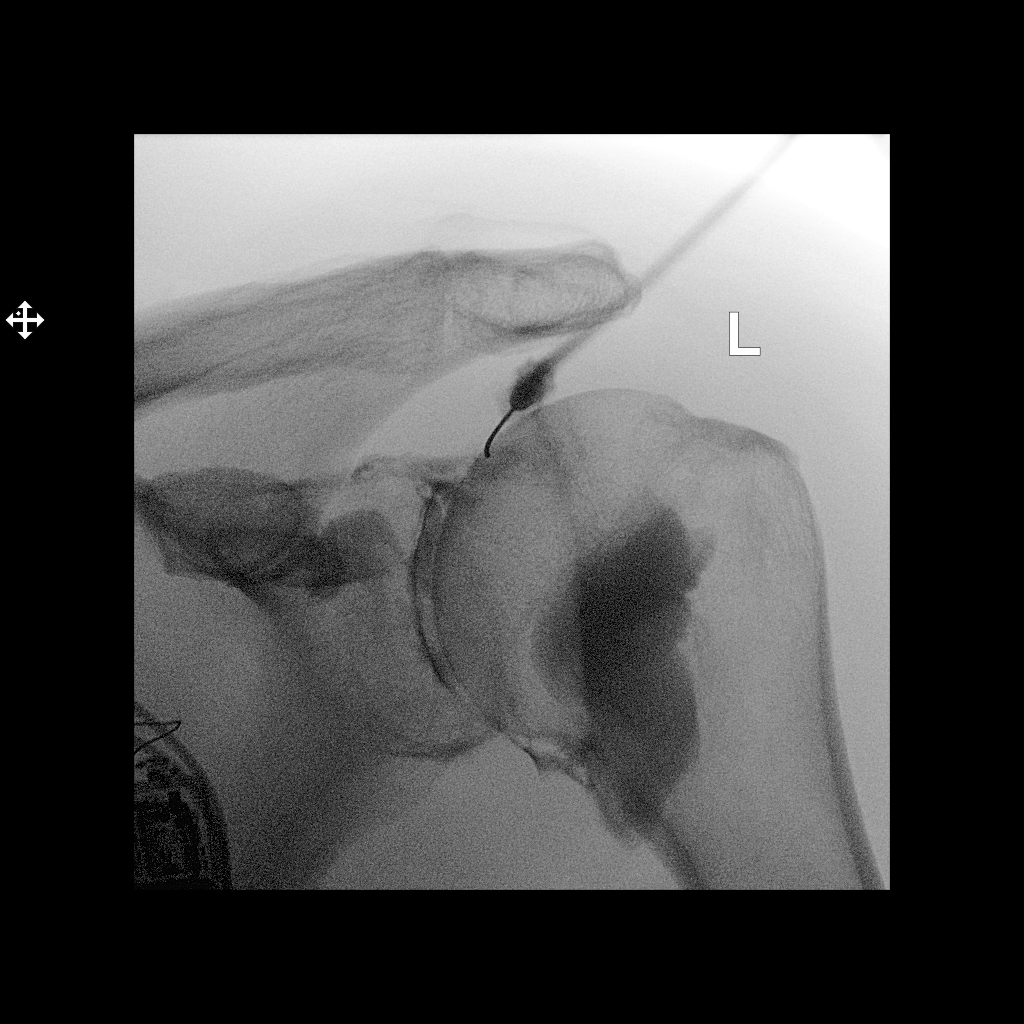

[2 of 2 positions shown; findings below may reference images not displayed]

FINDINGS: Adequate opacification of the left glenohumeral joint.
IMPRESSION: Technically successful left shoulder injection for CT.

## 2021-09-18 NOTE — Progress Notes (Signed)
Russell Griffith is a 75 y.o. male who presents to [facility] today for the following:  Left Shoulder pain f/u Last seen 5/31 Prior CT in March with thick capsule, mild AC OA Left shoulder [place] in May with biceps tendinopathy with superior spurring and calcifications and calcific changes within supraspinatus Prescribed nitro patches, HEP He has nitroglycerin patches for about a week, but had some headaches with this and stopped Also did home exercise program for about 2 weeks, but did not have improvement with this. He has not noticed any change in his pain since his last visit Pain is mostly in the anterior aspect of his shoulder and only with certain movements like when he is reaching backwards or putting on a jacket   PMH reviewed.  ROS as above. Medications reviewed.  Exam:  BP 132/76   Ht 5\' 11"  (1.803 m)   Wt 190 lb (86.2 kg)   BMI 26.50 kg/m  Gen: Well NAD MSK:  Left Shoulder: Inspection reveals no obvious deformity, atrophy, or asymmetry b/l. No bruising. No swelling He has tenderness to palpation over the left bicipital groove, none over the [facility] joint specifically. Full ROM in flexion, abduction, internal/external rotation b/l NV intact distally b/l Normal scapular function observed b/l Special Tests:  - Impingement: Neg Hawkins - Supraspinatous: Equivocal empty can - Infraspinatous/Teres Minor: 5/5 strength with ER - Subscapularis: 5/5 strength with IR, has some pain with this - Biceps tendon: Positive speeds - Labrum: Negative Obriens, good stability - AC Joint: Positive cross arm - No painful arc and no drop arm sign  Limited ULTRASOUND: Shoulder, left Diagnostic limitedcomplete ultrasound imaging obtained of patient's left shoulder.  - Long head of the biceps tendon: The most proximal insertion there is calcification and previously noted spur.  In mid tendon there is a split where 3 pieces of tendon are noted to be and there is some edema surrounding the  tendon. - Pec major insertion visualized without abnormality. - Supraspinatus tendon: complete visualization across the width of the insertion point yielded few calcifications with a very small partial-thickness tear. IMPRESSION: findings consistent with biceps tendinopathy with superior spurring calcifications as well as a very small chronic supraspinatus tear and calcific changes.  No results found.   Assessment and Plan: 1) Biceps tendinopathy, left Spurring is likely the most problematic for him and the cause of most of his biceps tendinopathy.  He has failed home exercises and nitro patches.  Discussed risk and benefits of shockwave therapy and he opted to trial this today.  Performed per below, tolerated the procedure well.  Did discuss that weekly treatment for at least 4 weeks may be helpful for him, he will determine if he would like to come back for this.  Procedure: ECSWT Indications:   Calcific biceps tendinopathy   Procedure Details Consent: Risks of procedure as well as the alternatives and risks of each were explained to the patient.  Written consent for procedure obtained. Time Out: Verified patient identification, verified procedure, site was marked, verified correct patient position, medications/allergies/relevent history reviewed.  The area was cleaned with alcohol swab.     The  left biceps tendon and left supraspinatus tendon was targeted for Extracorporeal shockwave therapy.    Preset:  Calcific shoulder tendinitis Power Level:  90 Frequency: 10 Impulse/cycles: 2000: 1500 at biceps tendon and 500 at supraspinatus tendon Head size:  Large   Patient tolerated procedure well without immediate complications     Arizona Constable, D.O.  PGY-4 [facility]  09/18/2021 5:21 PM  I observed and examined the patient with the resident and agree with assessment and plan.  Note reviewed and modified by me. Ysidro Evert

## 2021-09-18 NOTE — Assessment & Plan Note (Signed)
Spurring is likely the most problematic for him and the cause of most of his biceps tendinopathy.  He has failed home exercises and nitro patches.  Discussed risk and benefits of shockwave therapy and he opted to trial this today.  Performed per below, tolerated the procedure well.  Did discuss that weekly treatment for at least 4 weeks may be helpful for him, he will determine if he would like to come back for this.  Procedure: ECSWT Indications:   Calcific biceps tendinopathy   Procedure Details Consent: Risks of procedure as well as the alternatives and risks of each were explained to the patient.  Written consent for procedure obtained. Time Out: Verified patient identification, verified procedure, site was marked, verified correct patient position, medications/allergies/relevent history reviewed.  The area was cleaned with alcohol swab.     The  left biceps tendon and left supraspinatus tendon was targeted for Extracorporeal shockwave therapy.    Preset:  Calcific shoulder tendinitis Power Level:  90 Frequency: 10 Impulse/cycles: 2000: 1500 at biceps tendon and 500 at supraspinatus tendon Head size:  Large   Patient tolerated procedure well without immediate complications

## 2021-09-19 ENCOUNTER — Ambulatory Visit (INDEPENDENT_AMBULATORY_CARE_PROVIDER_SITE_OTHER): Payer: PPO

## 2021-09-19 DIAGNOSIS — I480 Paroxysmal atrial fibrillation: Secondary | ICD-10-CM

## 2021-09-21 LAB — CUP PACEART REMOTE DEVICE CHECK
Battery Remaining Longevity: 18 mo
Battery Remaining Percentage: 23 %
Brady Statistic RA Percent Paced: 0 %
Brady Statistic RV Percent Paced: 0 %
Date Time Interrogation Session: 20221021013100
Implantable Lead Implant Date: 20130816
Implantable Lead Implant Date: 20130816
Implantable Lead Location: 753859
Implantable Lead Location: 753860
Implantable Lead Model: 5076
Implantable Lead Model: 5076
Implantable Pulse Generator Implant Date: 20130816
Lead Channel Impedance Value: 548 Ohm
Lead Channel Impedance Value: 918 Ohm
Lead Channel Pacing Threshold Amplitude: 0.9 V
Lead Channel Pacing Threshold Pulse Width: 0.4 ms
Lead Channel Setting Pacing Amplitude: 2 V
Lead Channel Setting Sensing Sensitivity: 2.5 mV
Pulse Gen Serial Number: 111255

## 2021-09-22 ENCOUNTER — Encounter: Payer: PPO | Admitting: Internal Medicine

## 2021-09-24 ENCOUNTER — Ambulatory Visit (INDEPENDENT_AMBULATORY_CARE_PROVIDER_SITE_OTHER): Payer: Self-pay | Admitting: Sports Medicine

## 2021-09-24 VITALS — Ht 71.0 in

## 2021-09-24 DIAGNOSIS — G8929 Other chronic pain: Secondary | ICD-10-CM

## 2021-09-24 DIAGNOSIS — H43812 Vitreous degeneration, left eye: Secondary | ICD-10-CM | POA: Diagnosis not present

## 2021-09-24 DIAGNOSIS — H53452 Other localized visual field defect, left eye: Secondary | ICD-10-CM | POA: Diagnosis not present

## 2021-09-24 DIAGNOSIS — M67922 Unspecified disorder of synovium and tendon, left upper arm: Secondary | ICD-10-CM

## 2021-09-24 DIAGNOSIS — H35432 Paving stone degeneration of retina, left eye: Secondary | ICD-10-CM | POA: Diagnosis not present

## 2021-09-24 DIAGNOSIS — H21342 Primary cyst of pars plana, left eye: Secondary | ICD-10-CM | POA: Diagnosis not present

## 2021-09-24 DIAGNOSIS — M25512 Pain in left shoulder: Secondary | ICD-10-CM

## 2021-09-24 NOTE — Progress Notes (Signed)
Subjective: Ed is a pleasant 75 year old man who presents today for left shoulder pain with known calcific biceps tendinopathy and supraspinatus tendinopathy.  He had his first round obese ESWT last week, and presents for session #2 today.  Procedure: ECSWT Indications: Calcific biceps, supraspinatus tendinopathy   Procedure Details Consent: Risks of procedure as well as the alternatives and risks of each were explained to the patient.  Written consent for procedure obtained. Time Out: Verified patient identification, verified procedure, site was marked, verified correct patient position, medications/allergies/relevent history reviewed.  The area was cleaned with alcohol swab.     The left biceps tendon and left supraspinatus were targeted for Extracorporeal shockwave therapy.    Preset: Calcific shoulder tendinitis Power Level: 100 Frequency: 12 Impulse/cycles: 2000: (1400 at biceps tendon and 600 at supraspinatus tendon) Head size: large   Patient tolerated procedure well without immediate complications   Plan:  -Patient will follow-up next week for session #3 of shockwave therapy   Addendum:  I was the preceptor for this visit and available for immediate consultation.  Karlton Lemon MD [name]

## 2021-09-26 NOTE — Progress Notes (Signed)
Remote pacemaker transmission.   

## 2021-09-29 ENCOUNTER — Other Ambulatory Visit: Payer: Self-pay

## 2021-09-29 ENCOUNTER — Ambulatory Visit (INDEPENDENT_AMBULATORY_CARE_PROVIDER_SITE_OTHER): Payer: PPO | Admitting: Internal Medicine

## 2021-09-29 ENCOUNTER — Encounter: Payer: Self-pay | Admitting: Internal Medicine

## 2021-09-29 VITALS — BP 120/72 | HR 54 | Temp 98.1°F | Ht 71.0 in | Wt 202.0 lb

## 2021-09-29 DIAGNOSIS — R5383 Other fatigue: Secondary | ICD-10-CM

## 2021-09-29 DIAGNOSIS — R17 Unspecified jaundice: Secondary | ICD-10-CM

## 2021-09-29 DIAGNOSIS — R7989 Other specified abnormal findings of blood chemistry: Secondary | ICD-10-CM

## 2021-09-29 DIAGNOSIS — I25810 Atherosclerosis of coronary artery bypass graft(s) without angina pectoris: Secondary | ICD-10-CM

## 2021-09-29 DIAGNOSIS — Z0000 Encounter for general adult medical examination without abnormal findings: Secondary | ICD-10-CM

## 2021-09-29 LAB — CBC WITH DIFFERENTIAL/PLATELET
Basophils Absolute: 0 10*3/uL (ref 0.0–0.1)
Basophils Relative: 0.6 % (ref 0.0–3.0)
Eosinophils Absolute: 0.1 10*3/uL (ref 0.0–0.7)
Eosinophils Relative: 1.9 % (ref 0.0–5.0)
HCT: 41.7 % (ref 39.0–52.0)
Hemoglobin: 14.3 g/dL (ref 13.0–17.0)
Lymphocytes Relative: 26.7 % (ref 12.0–46.0)
Lymphs Abs: 1.5 10*3/uL (ref 0.7–4.0)
MCHC: 34.2 g/dL (ref 30.0–36.0)
MCV: 86.8 fl (ref 78.0–100.0)
Monocytes Absolute: 0.9 10*3/uL (ref 0.1–1.0)
Monocytes Relative: 16.2 % — ABNORMAL HIGH (ref 3.0–12.0)
Neutro Abs: 3.1 10*3/uL (ref 1.4–7.7)
Neutrophils Relative %: 54.6 % (ref 43.0–77.0)
Platelets: 201 10*3/uL (ref 150.0–400.0)
RBC: 4.81 Mil/uL (ref 4.22–5.81)
RDW: 13.9 % (ref 11.5–15.5)
WBC: 5.6 10*3/uL (ref 4.0–10.5)

## 2021-09-29 LAB — URINALYSIS
Bilirubin Urine: NEGATIVE
Ketones, ur: NEGATIVE
Leukocytes,Ua: NEGATIVE
Nitrite: NEGATIVE
Specific Gravity, Urine: 1.025 (ref 1.000–1.030)
Total Protein, Urine: NEGATIVE
Urine Glucose: NEGATIVE
Urobilinogen, UA: 1 (ref 0.0–1.0)
pH: 6 (ref 5.0–8.0)

## 2021-09-29 LAB — LIPID PANEL
Cholesterol: 107 mg/dL (ref 0–200)
HDL: 32.7 mg/dL — ABNORMAL LOW (ref >39.00)
LDL Cholesterol: 63 mg/dL (ref 0–99)
NonHDL: 73.88
Total CHOL/HDL Ratio: 3
Triglycerides: 56 mg/dL (ref 0.0–149.0)
VLDL: 11.2 mg/dL (ref 0.0–40.0)

## 2021-09-29 LAB — COMPREHENSIVE METABOLIC PANEL
ALT: 29 U/L (ref 0–53)
AST: 27 U/L (ref 0–37)
Albumin: 4.5 g/dL (ref 3.5–5.2)
Alkaline Phosphatase: 75 U/L (ref 39–117)
BUN: 23 mg/dL (ref 6–23)
CO2: 26 mEq/L (ref 19–32)
Calcium: 9.4 mg/dL (ref 8.4–10.5)
Chloride: 103 mEq/L (ref 96–112)
Creatinine, Ser: 1.17 mg/dL (ref 0.40–1.50)
GFR: 61.76 mL/min (ref >60.00)
Glucose, Bld: 102 mg/dL — ABNORMAL HIGH (ref 70–99)
Potassium: 4.3 mEq/L (ref 3.5–5.1)
Sodium: 138 mEq/L (ref 135–145)
Total Bilirubin: 2.8 mg/dL — ABNORMAL HIGH (ref 0.2–1.2)
Total Protein: 6.9 g/dL (ref 6.0–8.3)

## 2021-09-29 LAB — PSA: PSA: 0.81 ng/mL (ref 0.10–4.00)

## 2021-09-29 LAB — TSH: TSH: 4.55 u[IU]/mL (ref 0.35–5.50)

## 2021-09-29 LAB — T4, FREE: Free T4: 0.98 ng/dL (ref 0.60–1.60)

## 2021-09-29 MED ORDER — LOSARTAN POTASSIUM 25 MG PO TABS: 25.0000 mg | ORAL_TABLET | Freq: Every day | ORAL | 3 refills | Status: DC

## 2021-09-29 NOTE — Assessment & Plan Note (Signed)
Check LFTs 

## 2021-09-29 NOTE — Progress Notes (Signed)
Subjective:  Patient ID: Russell Griffith, male    DOB: [birth year]  Age: 75 y.o. MRN: [id number]  CC: Annual Exam   HPI Russell Griffith presents for a well exam C/o side effects w/spironolactone. It was given for SOB.  He is in A fib  Outpatient Medications Prior to Visit  Medication Sig Dispense Refill   acetaminophen (TYLENOL) 500 MG tablet Take 1,000 mg by mouth every 6 (six) hours as needed for moderate pain or headache.     atorvastatin (LIPITOR) 20 MG tablet TAKE 1 TABLET ONCE DAILY AT 6 PM. 90 tablet 2   Cholecalciferol 25 MCG (1000 UT) capsule Take 1,000 Units by mouth daily with supper.      ELIQUIS 5 MG TABS tablet TAKE 1 TABLET BY MOUTH TWICE DAILY. 60 tablet 11   loratadine (CLARITIN) 10 MG tablet Take 1 tablet (10 mg total) by mouth daily. 30 tablet 11   moxifloxacin (VIGAMOX) 0.5 % ophthalmic solution as needed.     NONFORMULARY OR COMPOUNDED ITEM Apply 1 application topically daily as needed (rash). Cetaphil + triamcinolone cream     pantoprazole (PROTONIX) 40 MG tablet TAKE 1 TABLET ONCE DAILY. 90 tablet 3   polyvinyl alcohol (LIQUIFILM TEARS) 1.4 % ophthalmic solution Place 1 drop into both eyes every 8 (eight) hours as needed for dry eyes.     prednisoLONE acetate (PRED FORTE) 1 % ophthalmic suspension Place 1 drop into the left eye as needed.     triamcinolone cream (KENALOG) 0.1 % as needed.     ramipril (ALTACE) 2.5 MG capsule TAKE (1) CAPSULE DAILY. 90 capsule 3   spironolactone (ALDACTONE) 25 MG tablet Take 1 tablet (25 mg total) by mouth daily. 90 tablet 3   nitroGLYCERIN (NITRODUR - DOSED IN MG/24 HR) 0.2 mg/hr patch Use 1/4 patch daily to the affected area. 30 patch 1   Facility-Administered Medications Prior to Visit  Medication Dose Route Frequency Provider Last Rate Last Admin   0.9 %  sodium chloride infusion  500 mL Intravenous Once Milus Banister, MD        ROS: Review of Systems  Constitutional:  Positive for fatigue. Negative for  appetite change and unexpected weight change.  HENT:  Negative for congestion, nosebleeds, sneezing, sore throat and trouble swallowing.   Eyes:  Negative for itching and visual disturbance.  Respiratory:  Negative for cough.   Cardiovascular:  Negative for chest pain, palpitations and leg swelling.  Gastrointestinal:  Negative for abdominal distention, blood in stool, diarrhea and nausea.  Genitourinary:  Negative for frequency and hematuria.  Musculoskeletal:  Positive for myalgias. Negative for back pain, gait problem, joint swelling and neck pain.  Skin:  Negative for rash.  Neurological:  Negative for dizziness, tremors, speech difficulty and weakness.  Psychiatric/Behavioral:  Negative for agitation, dysphoric mood, sleep disturbance and suicidal ideas. The patient is not nervous/anxious.    Objective:  BP 120/72 (BP Location: Left Arm)   Pulse (!) 54   Temp 98.1 F (36.7 C) (Oral)   Ht 5\' 11"  (1.803 m)   Wt 202 lb (91.6 kg)   SpO2 98%   BMI 28.17 kg/m   BP Readings from Last 3 Encounters:  09/29/21 120/72  09/18/21 132/76  08/06/21 120/68    Wt Readings from Last 3 Encounters:  09/29/21 202 lb (91.6 kg)  09/18/21 190 lb (86.2 kg)  08/06/21 199 lb 9.6 oz (90.5 kg)    Physical Exam Constitutional:      General:  He is not in acute distress.    Appearance: He is well-developed.     Comments: NAD  Eyes:     Conjunctiva/sclera: Conjunctivae normal.     Pupils: Pupils are equal, round, and reactive to light.  Neck:     Thyroid: No thyromegaly.     Vascular: No JVD.  Cardiovascular:     Rate and Rhythm: Normal rate. Rhythm irregular.     Heart sounds: Normal heart sounds. No murmur heard.   No friction rub. No gallop.  Pulmonary:     Effort: Pulmonary effort is normal. No respiratory distress.     Breath sounds: Normal breath sounds. No wheezing or rales.  Chest:     Chest wall: No tenderness.  Abdominal:     General: Bowel sounds are normal. There is no  distension.     Palpations: Abdomen is soft. There is no mass.     Tenderness: no abdominal tenderness There is no guarding or rebound.  Musculoskeletal:        General: No tenderness. Normal range of motion.     Cervical back: Normal range of motion.  Lymphadenopathy:     Cervical: No cervical adenopathy.  Skin:    General: Skin is warm and dry.     Findings: No rash.  Neurological:     Mental Status: He is alert and oriented to person, place, and time.     Cranial Nerves: No cranial nerve deficit.     Motor: No abnormal muscle tone.     Coordination: Coordination normal.     Gait: Gait normal.     Deep Tendon Reflexes: Reflexes are normal and symmetric.  Psychiatric:        Behavior: Behavior normal.        Thought Content: Thought content normal.        Judgment: Judgment normal.   Rectal - declined  -- per GI  Lab Results  Component Value Date   WBC 6.5 07/27/2021   HGB 13.8 07/27/2021   HCT 41.5 07/27/2021   PLT 186 07/27/2021   GLUCOSE 90 08/18/2021   CHOL 108 09/19/2020   TRIG 54.0 09/19/2020   HDL 34.20 (L) 09/19/2020   LDLCALC 63 09/19/2020   ALT 16 07/27/2021   AST 17 07/27/2021   NA 137 08/18/2021   K 4.4 08/18/2021   CL 101 08/18/2021   CREATININE 1.15 08/18/2021   BUN 22 08/18/2021   CO2 22 08/18/2021   TSH 3.98 06/09/2021   PSA 1.47 09/19/2020   INR 1.53 (H) 07/15/2012   HGBA1C 5.8 07/30/2015    No results found.  Assessment & Plan:   Problem List Items Addressed This Visit     Abnormal TSH   Relevant Orders   T4, free   CAD (coronary artery disease)    On Eliquis, Lipitor Myoview March, 2011, excellent exercise, hypertensive response, no scar or ischemia, EF 61%, mild palpitations peak stress with infrequent PACs and PVCs. //  Nuclear, November, 2013, no ischemia, study was not gated.      Relevant Medications   losartan (COZAAR) 25 MG tablet   Elevated bilirubin    Check LFTs      Fatigue      Obtain free T4, TSH Stop  spironolactone Stop Ramipril      Well adult exam - Primary     We discussed age appropriate health related issues, including available/recomended screening tests and vaccinations. Labs were ordered to be later reviewed . All questions were  answered. We discussed one or more of the following - seat belt use, use of sunscreen/sun exposure exercise, fall risk reduction, second hand smoke exposure, firearm use and storage, seat belt use, a need for adhering to healthy diet and exercise. Labs were ordered.  All questions were answered.        Relevant Orders   TSH   CBC with Differential/Platelet   Urinalysis   Lipid panel   PSA   Comprehensive metabolic panel   T4, free      Meds ordered this encounter  Medications   losartan (COZAAR) 25 MG tablet    Sig: Take 1 tablet (25 mg total) by mouth daily.    Dispense:  90 tablet    Refill:  3      Follow-up: Return in about 3 months (around 12/30/2021) for a follow-up visit.  Walker Kehr, MD  Medical screening examination/treatment/procedure(s) were performed by non-physician practitioner and as supervising physician I was immediately available for consultation/collaboration.  I agree with above. Lew Dawes, MD

## 2021-09-29 NOTE — Patient Instructions (Signed)
Valerian root for insomnia Stop Ramipril Stop Spronolactone

## 2021-09-29 NOTE — Assessment & Plan Note (Signed)
On Eliquis, Lipitor Myoview March, 2011, excellent exercise, hypertensive response, no scar or ischemia, EF 61%, mild palpitations peak stress with infrequent PACs and PVCs. //  Nuclear, November, 2013, no ischemia, study was not gated.

## 2021-09-29 NOTE — Assessment & Plan Note (Addendum)
Obtain free T4, TSH Stop spironolactone Stop Ramipril

## 2021-09-29 NOTE — Assessment & Plan Note (Signed)

## 2021-10-01 ENCOUNTER — Ambulatory Visit (INDEPENDENT_AMBULATORY_CARE_PROVIDER_SITE_OTHER): Payer: Self-pay | Admitting: Sports Medicine

## 2021-10-01 DIAGNOSIS — G8929 Other chronic pain: Secondary | ICD-10-CM

## 2021-10-01 DIAGNOSIS — M67922 Unspecified disorder of synovium and tendon, left upper arm: Secondary | ICD-10-CM

## 2021-10-01 DIAGNOSIS — M25512 Pain in left shoulder: Secondary | ICD-10-CM

## 2021-10-01 NOTE — Progress Notes (Signed)
Subjective: Russell Griffith is a pleasant 75 year old man who presents today for follow-up of left shoulder pain with known calcific biceps tendinopathy and supraspinatus tendinopathy.  He received his second round of ECSWT last week, and presents today for session #3.  He does continue to have benefit s/p treatment. He does feel like the bicep (anterior) part is more improved than the posterior aspect of shoulder.  Procedure: ECSWT Indications: Calcific biceps, supraspinatus tendinopathy   Procedure Details Consent: Risks of procedure as well as the alternatives and risks of each were explained to the patient.  Written consent for procedure obtained. Time Out: Verified patient identification, verified procedure, site was marked, verified correct patient position, medications/allergies/relevent history reviewed.  The area was cleaned with alcohol swab.     The left biceps tendon and left supraspinatus were targeted for Extracorporeal shockwave therapy.    Preset: Calcific shoulder tendinitis Power Level: 110 Frequency: 12 Impulse/cycles: 2000: (1000 at biceps tendon and 1000 at supraspinatus tendon) Head size: large   Patient tolerated procedure well without immediate complications   Plan:  - Given his improvement moreso in biceps tendon, we provided more balanced impulses to the supraspinatus today, 1000 targeted at each - Patient will return in one week for ECSWT treatment session #4, will likely shoot for around 5 or so treatment sessions and then see how he is responding  Addendum:  I was the preceptor for this visit and available for immediate consultation.  Karlton Lemon MD Russell Griffith

## 2021-10-07 DIAGNOSIS — L821 Other seborrheic keratosis: Secondary | ICD-10-CM | POA: Diagnosis not present

## 2021-10-07 DIAGNOSIS — L308 Other specified dermatitis: Secondary | ICD-10-CM | POA: Diagnosis not present

## 2021-10-07 DIAGNOSIS — D225 Melanocytic nevi of trunk: Secondary | ICD-10-CM | POA: Diagnosis not present

## 2021-10-07 DIAGNOSIS — L812 Freckles: Secondary | ICD-10-CM | POA: Diagnosis not present

## 2021-10-07 DIAGNOSIS — D2262 Melanocytic nevi of left upper limb, including shoulder: Secondary | ICD-10-CM | POA: Diagnosis not present

## 2021-10-07 DIAGNOSIS — Z85828 Personal history of other malignant neoplasm of skin: Secondary | ICD-10-CM | POA: Diagnosis not present

## 2021-10-07 DIAGNOSIS — D2261 Melanocytic nevi of right upper limb, including shoulder: Secondary | ICD-10-CM | POA: Diagnosis not present

## 2021-10-07 DIAGNOSIS — L570 Actinic keratosis: Secondary | ICD-10-CM | POA: Diagnosis not present

## 2021-10-08 ENCOUNTER — Ambulatory Visit: Payer: PPO | Admitting: Sports Medicine

## 2021-10-15 ENCOUNTER — Ambulatory Visit (INDEPENDENT_AMBULATORY_CARE_PROVIDER_SITE_OTHER): Payer: Self-pay | Admitting: Sports Medicine

## 2021-10-15 VITALS — Ht 71.0 in

## 2021-10-15 DIAGNOSIS — M67922 Unspecified disorder of synovium and tendon, left upper arm: Secondary | ICD-10-CM

## 2021-10-15 DIAGNOSIS — G8929 Other chronic pain: Secondary | ICD-10-CM

## 2021-10-15 DIAGNOSIS — M25512 Pain in left shoulder: Secondary | ICD-10-CM

## 2021-10-15 NOTE — Progress Notes (Signed)
Subjective: Russell Griffith is a very pleasant 75 year old man who presents today for follow-up of left shoulder pain with known calcific biceps tendinopathy and supraspinatus tendinopathy.  He received his third round of ECSWT last week, and presents today for session #4.  He does report today that he does not feel like he has received quite as much benefit as he did with his earlier sessions, but overall is still feeling better.   Procedure: ECSWT Indications: Calcific biceps, supraspinatus tendinopathy   Procedure Details Consent: Risks of procedure as well as the alternatives and risks of each were explained to the patient.  Written consent for procedure obtained.  Time Out: Verified patient identification, verified procedure, site was marked, verified correct patient position, medications/allergies/relevent history reviewed.  The area was cleaned with alcohol swab.     The left biceps tendon and left supraspinatus were targeted for Extracorporeal shockwave therapy.    Preset: Calcific shoulder tendinitis Power Level: 110 Frequency: 10 Impulse/cycles: 2000: (1000 at biceps tendon and 1000 at supraspinatus tendon)  Head size: large   Patient tolerated procedure well without immediate complications    Plan:   - Patient will return in one week for ECSWT treatment session #5, we will evaluate after that 5th session to see his response to treatment at that time and decide on further management, although discussed today we will likely give him a break for a few weeks off to see how he is feeling. -We discussed other options such as repeat cortisone injection or formal physical therapy if not improved, he is currently doing at home exercises.  Russell Barman, DO PGY-4, Sports Medicine Fellow [place]  Addendum:  I was the preceptor for this visit and available for immediate consultation.  Russell Lemon MD Russell Griffith

## 2021-10-22 ENCOUNTER — Ambulatory Visit (INDEPENDENT_AMBULATORY_CARE_PROVIDER_SITE_OTHER): Payer: Self-pay | Admitting: Sports Medicine

## 2021-10-22 VITALS — Ht 71.0 in

## 2021-10-22 DIAGNOSIS — M67922 Unspecified disorder of synovium and tendon, left upper arm: Secondary | ICD-10-CM

## 2021-10-22 DIAGNOSIS — M25512 Pain in left shoulder: Secondary | ICD-10-CM

## 2021-10-22 DIAGNOSIS — G8929 Other chronic pain: Secondary | ICD-10-CM

## 2021-10-22 NOTE — Progress Notes (Signed)
Subjective: Russell Griffith is a very pleasant 75 year old man who presents today for follow-up for session #5 of ECSWT for left shoulder pain with known calcific biceps tendinopathy and supraspinatus tendinopathy. He has tolerated this procedure well in the past without too much soreness following treatment. He feels like he did receive more benefit from earlier session of ECSWT. He hopes to receive continued benefit and be able to perform daily activities and some lifting at the gym without limiting pain of the shoulder/arm.    Procedure: ECSWT Indications: Calcific biceps, supraspinatus tendinopathy   Procedure Details Consent: Risks of procedure as well as the alternatives and risks of each were explained to the patient.  Written consent for procedure obtained.   Time Out: Verified patient identification, verified procedure, site was marked, verified correct patient position, medications/allergies/relevent history reviewed.  The area was cleaned with alcohol swab.     The left biceps tendon and left supraspinatus were targeted for Extracorporeal shockwave therapy.    Preset: Calcific shoulder tendinitis Power Level: 90 Frequency: 10   Impulse/cycles: 2000: (1000 at biceps tendon and 1000 at supraspinatus tendon)  Head size: large   Patient tolerated procedure well without immediate complications    Plan:  -Did provide ECSWT session #5 today.  At this point, I believe he has reached his maximal acute benefit of this therapy -We will give him about a month's time to see he continue therapeutic benefit from prior shockwave therapy and he will follow-up in 1 month for reevaluation -He is to continue at home shoulder/rotator cuff exercises maximize range of motion and function. Re-printed HEP for patient today -He may use topical Voltaren gel or Tylenol for any pain -We will follow-up in 1 month   Russell Barman, DO PGY-4, [place]  Addendum:  I was  the preceptor for this visit and available for immediate consultation.  Karlton Lemon MD [name]

## 2021-10-31 ENCOUNTER — Ambulatory Visit (INDEPENDENT_AMBULATORY_CARE_PROVIDER_SITE_OTHER): Payer: PPO | Admitting: Internal Medicine

## 2021-10-31 ENCOUNTER — Encounter: Payer: Self-pay | Admitting: Internal Medicine

## 2021-10-31 ENCOUNTER — Other Ambulatory Visit: Payer: Self-pay

## 2021-10-31 DIAGNOSIS — E785 Hyperlipidemia, unspecified: Secondary | ICD-10-CM

## 2021-10-31 DIAGNOSIS — R1013 Epigastric pain: Secondary | ICD-10-CM

## 2021-10-31 DIAGNOSIS — I4819 Other persistent atrial fibrillation: Secondary | ICD-10-CM | POA: Diagnosis not present

## 2021-10-31 DIAGNOSIS — I10 Essential (primary) hypertension: Secondary | ICD-10-CM | POA: Diagnosis not present

## 2021-10-31 MED ORDER — RAMIPRIL 2.5 MG PO CAPS: 2.5000 mg | ORAL_CAPSULE | Freq: Every day | ORAL | 0 refills | Status: DC

## 2021-10-31 MED ORDER — PANCRELIPASE (LIP-PROT-AMYL) 36000-114000 UNITS PO CPEP: 36000.0000 [IU] | ORAL_CAPSULE | Freq: Three times a day (TID) | ORAL | 3 refills | Status: DC

## 2021-10-31 NOTE — Progress Notes (Signed)
Subjective:  Patient ID: Russell Griffith, male    DOB: 1946/08/28  Age: 75 y.o. MRN: [id number]  CC: Abdominal Pain (Pt states he statrted having sxs after starting the Losartan, but he stop taking and started bck on Ramipril) and Bloated    HPI [name] presents for stomach tightness 5-6/10 worse w/exercise x 1.5 weeks ago Ed cut back on carbs, deserts.  The pain is better w/sitting or laying. He was nauseated -doing well now. He has been having these episodes off and on for several years, 2-4 episodes per year.  They were all self-limited.  No pathology was ever detected.  Outpatient Medications Prior to Visit  Medication Sig Dispense Refill   acetaminophen (TYLENOL) 500 MG tablet Take 1,000 mg by mouth every 6 (six) hours as needed for moderate pain or headache.     atorvastatin (LIPITOR) 20 MG tablet TAKE 1 TABLET ONCE DAILY AT 6 PM. 90 tablet 2   Cholecalciferol 25 MCG (1000 UT) capsule Take 1,000 Units by mouth daily with supper.      ELIQUIS 5 MG TABS tablet TAKE 1 TABLET BY MOUTH TWICE DAILY. 60 tablet 11   loratadine (CLARITIN) 10 MG tablet Take 1 tablet (10 mg total) by mouth daily. 30 tablet 11   moxifloxacin (VIGAMOX) 0.5 % ophthalmic solution as needed.     NONFORMULARY OR COMPOUNDED ITEM Apply 1 application topically daily as needed (rash). Cetaphil + triamcinolone cream     pantoprazole (PROTONIX) 40 MG tablet TAKE 1 TABLET ONCE DAILY. 90 tablet 3   polyvinyl alcohol (LIQUIFILM TEARS) 1.4 % ophthalmic solution Place 1 drop into both eyes every 8 (eight) hours as needed for dry eyes.     prednisoLONE acetate (PRED FORTE) 1 % ophthalmic suspension Place 1 drop into the left eye as needed.     triamcinolone cream (KENALOG) 0.1 % as needed.     losartan (COZAAR) 25 MG tablet Take 1 tablet (25 mg total) by mouth daily. (Patient not taking: Reported on 10/31/2021) 90 tablet 3   0.9 %  sodium chloride infusion      No facility-administered medications prior to  visit.    ROS: Review of Systems  Constitutional:  Negative for appetite change, fatigue and unexpected weight change.  HENT:  Negative for congestion, nosebleeds, sneezing, sore throat and trouble swallowing.   Eyes:  Negative for itching and visual disturbance.  Respiratory:  Negative for cough.   Cardiovascular:  Negative for chest pain, palpitations and leg swelling.  Gastrointestinal:  Positive for abdominal distention, abdominal pain and nausea. Negative for blood in stool, diarrhea and vomiting.  Genitourinary:  Negative for frequency and hematuria.  Musculoskeletal:  Negative for back pain, gait problem, joint swelling and neck pain.  Skin:  Negative for rash.  Neurological:  Negative for dizziness, tremors, speech difficulty and weakness.  Psychiatric/Behavioral:  Negative for agitation, dysphoric mood and sleep disturbance. The patient is not nervous/anxious.    Objective:  BP 128/72 (BP Location: Left Arm)   Pulse 60   Temp 98 F (36.7 C) (Oral)   Ht 5\' 11"  (1.803 m)   Wt 201 lb 12.8 oz (91.5 kg)   SpO2 96%   BMI 28.15 kg/m   BP Readings from Last 3 Encounters:  10/31/21 128/72  09/29/21 120/72  09/18/21 132/76    Wt Readings from Last 3 Encounters:  10/31/21 201 lb 12.8 oz (91.5 kg)  09/29/21 202 lb (91.6 kg)  09/18/21 190 lb (86.2 kg)  Physical Exam Constitutional:      General: He is not in acute distress.    Appearance: He is well-developed. He is not ill-appearing, toxic-appearing or diaphoretic.     Comments: NAD  Eyes:     Conjunctiva/sclera: Conjunctivae normal.     Pupils: Pupils are equal, round, and reactive to light.  Neck:     Thyroid: No thyromegaly.     Vascular: No JVD.  Cardiovascular:     Rate and Rhythm: Normal rate and regular rhythm.     Heart sounds: Normal heart sounds. No murmur heard.   No friction rub. No gallop.  Pulmonary:     Effort: Pulmonary effort is normal. No respiratory distress.     Breath sounds: Normal breath  sounds. No wheezing or rales.  Chest:     Chest wall: No tenderness.  Abdominal:     General: Bowel sounds are normal. There is no distension.     Palpations: Abdomen is soft. There is no mass.     Tenderness: There is no abdominal tenderness. There is no guarding or rebound.  Musculoskeletal:        General: No tenderness. Normal range of motion.     Cervical back: Normal range of motion.  Lymphadenopathy:     Cervical: No cervical adenopathy.  Skin:    General: Skin is warm and dry.     Findings: No rash.  Neurological:     Mental Status: He is alert and oriented to person, place, and time.     Cranial Nerves: No cranial nerve deficit.     Motor: No abnormal muscle tone.     Coordination: Coordination normal.     Gait: Gait normal.     Deep Tendon Reflexes: Reflexes are normal and symmetric.  Psychiatric:        Behavior: Behavior normal.        Thought Content: Thought content normal.        Judgment: Judgment normal.  There is a small oval lipoma-like subcutaneous lesion to the left from the epigastric area No hernia or mass  Lab Results  Component Value Date   WBC 5.6 09/29/2021   HGB 14.3 09/29/2021   HCT 41.7 09/29/2021   PLT 201.0 09/29/2021   GLUCOSE 102 (H) 09/29/2021   CHOL 107 09/29/2021   TRIG 56.0 09/29/2021   HDL 32.70 (L) 09/29/2021   LDLCALC 63 09/29/2021   ALT 29 09/29/2021   AST 27 09/29/2021   NA 138 09/29/2021   K 4.3 09/29/2021   CL 103 09/29/2021   CREATININE 1.17 09/29/2021   BUN 23 09/29/2021   CO2 26 09/29/2021   TSH 4.55 09/29/2021   PSA 0.81 09/29/2021   INR 1.53 (H) 07/15/2012   HGBA1C 5.8 07/30/2015    No results found.  Assessment & Plan:   Problem List Items Addressed This Visit     A-fib ([place])    Continue with Eliquis      Relevant Medications   ramipril (ALTACE) 2.5 MG capsule   Dyslipidemia    Hold Lipitor if abdominal discomfort recurs      Epigastric pain    Complain of stomach tightness 5-6/10 worse  w/exercise starting 1.5 weeks ago. Ed cut back on carbs, deserts.  The pain is better w/sitting or laying. He was nauseated -doing well now. He has been having these episodes off and on for several years, 2-4 episodes per year.  They were all self-limited.  No pathology was ever detected.  Chart  reviewed.  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  I asked dad to go to the lab if he has another episode to obtain lipase/amylase and other tests.  You can follow-up with gastroenterology.  If you get this upper abdominal pain again: hold Lipitor, stop alcohol, stay on a low fat diet, hydrate well until it is gone. Try digestive enzymes      Relevant Orders   Comprehensive metabolic panel   Lipase   CBC with Differential/Platelet   Amylase   HTN (hypertension)    Discontinue ramipril.  Start losartan      Relevant Medications   ramipril (ALTACE) 2.5 MG capsule      Meds ordered this encounter  Medications   ramipril (ALTACE) 2.5 MG capsule    Sig: Take 1 capsule (2.5 mg total) by mouth daily.    Dispense:  90 capsule    Refill:  0   lipase/protease/amylase (CREON) 36000 UNITS CPEP capsule    Sig: Take 1 capsule (36,000 Units total) by mouth 3 (three) times daily before meals. Take with the first bite of food    Dispense:  180 capsule    Refill:  3      Follow-up: No follow-ups on file.  Walker Kehr, MD

## 2021-10-31 NOTE — Assessment & Plan Note (Addendum)
Complain of stomach tightness 5-6/10 worse w/exercise starting 1.5 weeks ago. Ed cut back on carbs, deserts.  The pain is better w/sitting or laying. He was nauseated -doing well now. He has been having these episodes off and on for several years, 2-4 episodes per year.  They were all self-limited.  No pathology was ever detected.  Chart reviewed.  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  I asked dad to go to the lab if he has another episode to obtain lipase/amylase and other tests.  You can follow-up with gastroenterology.  If you get this upper abdominal pain again: hold Lipitor, stop alcohol, stay on a low fat diet, hydrate well until it is gone. Try digestive enzymes

## 2021-10-31 NOTE — Patient Instructions (Signed)
If you get this upper abdominal pain again: hold Lipitor, stop alcohol, stay on a low fat diet, hydrate well until it is gone. Try digestive enzymes

## 2021-11-03 ENCOUNTER — Encounter: Payer: Self-pay | Admitting: Internal Medicine

## 2021-11-03 NOTE — Assessment & Plan Note (Signed)
Hold Lipitor if abdominal discomfort recurs

## 2021-11-03 NOTE — Assessment & Plan Note (Signed)
Discontinue ramipril.  Start losartan

## 2021-11-03 NOTE — Assessment & Plan Note (Signed)
Continue with Eliquis. ?

## 2021-11-05 ENCOUNTER — Ambulatory Visit: Payer: PPO | Admitting: Sports Medicine

## 2021-11-05 VITALS — Ht 71.0 in | Wt 192.0 lb

## 2021-11-05 DIAGNOSIS — M9908 Segmental and somatic dysfunction of rib cage: Secondary | ICD-10-CM | POA: Diagnosis not present

## 2021-11-05 DIAGNOSIS — R0781 Pleurodynia: Secondary | ICD-10-CM

## 2021-11-05 NOTE — Progress Notes (Signed)
PCP: [name], Evie Lacks, MD  Subjective:   HPI: Patient is a 75 y.o. male here for left anterior rib pain.  Ed states that about 3 to 4 weeks ago he was increasing some of his weight when weightlifting and did not have any pain, pop or pull during the activity, but following this workout he had some pain in the left costal cage.  He had been doing activities with lifting weight overhead and with twisting motions with a dumbbell to the side of him.  His pain has somewhat waxed and waned over these couple weeks, but has not gotten better completely.  He does not have pain at rest.  But he will have pain on the left rib cage with twisting activity, or when he coughs or sneezes.  He saw his primary care physician and they thought they may have palpated a lipoma or a lump there and so they thought he should be checked out by sports medicine doctor otherwise he is feeling well.  He has no pain at night.  He has no worsening shortness of breath, he does state he has baseline shortness of breath due to his chronic persistent atrial fibrillation.  He denies any fever or chills.  No redness, ecchymosis around this area.  He does note a slight prominence of the left anterior rib cage.  Past Medical History:  Diagnosis Date   Anxiety    Aortic stenosis    Mild, echo, April, 2014   BPH (benign prostatic hyperplasia)    CAD (coronary artery disease)    a. 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 [facility]VI at [facility]   Precancerous lesion    Forehead   Primary osteoarthritis of left knee    Mild   Prolapsed internal hemorrhoids, grade 3 08/12/2015    PVC's (premature ventricular contractions)    Tubular adenoma of colon     Current Outpatient Medications on File Prior to Visit  Medication Sig Dispense Refill   acetaminophen (TYLENOL) 500 MG tablet Take 1,000 mg by mouth every 6 (six) hours as needed for moderate pain or headache.     atorvastatin (LIPITOR) 20 MG tablet TAKE 1 TABLET ONCE DAILY AT 6 PM. 90 tablet 2   Cholecalciferol 25 MCG (1000 UT) capsule Take 1,000 Units by mouth daily with supper.      ELIQUIS 5 MG TABS tablet TAKE 1 TABLET BY MOUTH TWICE DAILY. 60 tablet 11   lipase/protease/amylase (CREON) 36000 UNITS CPEP capsule Take 1 capsule (36,000 Units total) by mouth 3 (three) times daily before meals. Take with the first bite of food 180 capsule 3   loratadine (CLARITIN) 10 MG tablet Take 1 tablet (10 mg total) by mouth daily. 30 tablet 11   moxifloxacin (VIGAMOX) 0.5 % ophthalmic solution as needed.     NONFORMULARY OR COMPOUNDED ITEM Apply 1 application topically daily as needed (rash). Cetaphil + triamcinolone cream     pantoprazole (PROTONIX) 40 MG tablet TAKE 1 TABLET ONCE DAILY. 90 tablet 3   polyvinyl alcohol (  LIQUIFILM TEARS) 1.4 % ophthalmic solution Place 1 drop into both eyes every 8 (eight) hours as needed for dry eyes.     prednisoLONE acetate (PRED FORTE) 1 % ophthalmic suspension Place 1 drop into the left eye as needed.     ramipril (ALTACE) 2.5 MG capsule Take 1 capsule (2.5 mg total) by mouth daily. 90 capsule 0   triamcinolone cream (KENALOG) 0.1 % as needed.     No current facility-administered medications on file prior to visit.    Past Surgical History:  Procedure Laterality Date   ABLATION     PVI at [place] 01/25/2019   Procedure: [place];  Surgeon: [name], MD;  Location: [place] CV LAB;  Service: Cardiovascular;  Laterality: N/A;   CARDIOVERSION N/A 10/14/2018   Procedure: CARDIOVERSION;  Surgeon: Fay Records, MD;  Location: [place];  Service: Cardiovascular;  Laterality: N/A;   CARDIOVERSION N/A 08/18/2019   Procedure: CARDIOVERSION;  Surgeon: Jerline Pain, MD;  Location: [place];  Service: Cardiovascular;  Laterality: N/A;   COLONOSCOPY     CORONARY ARTERY BYPASS GRAFT  2000   CABG X5   CYSTOSCOPY WITH RETROGRADE PYELOGRAM, URETEROSCOPY AND STENT PLACEMENT Bilateral 09/04/2020   Procedure: CYSTOSCOPY WITH BILATERAL RETROGRADE PYELOGRAM, RIGHT URETEROSCOPY AND RIGHT  STENT PLACEMENT;  Surgeon: Franchot Gallo, MD;  Location: [facility];  Service: Urology;  Laterality: Bilateral;   ESOPHAGOGASTRODUODENOSCOPY     HEAD & NECK SKIN LESION EXCISIONAL BIOPSY     HEMORRHOID BANDING     INTRAVASCULAR PRESSURE WIRE/FFR STUDY N/A 01/05/2018   Procedure: INTRAVASCULAR PRESSURE WIRE/FFR STUDY;  Surgeon: Sherren Mocha, MD;  Location: [place] CV LAB;  Service: Cardiovascular;  Laterality: N/A;   LEFT HEART CATH AND CORS/GRAFTS ANGIOGRAPHY N/A 01/05/2018   Procedure: LEFT HEART CATH AND CORS/GRAFTS ANGIOGRAPHY;  Surgeon: Sherren Mocha, MD;  Location: [place] CV LAB;  Service: Cardiovascular;  Laterality: N/A;   PERMANENT PACEMAKER INSERTION N/A 07/15/2012   Procedure: PERMANENT PACEMAKER INSERTION;  Surgeon: Deboraha Sprang, MD;  Location: [facility] CATH LAB;  Service: Cardiovascular;  Laterality: N/A;   rotator cuff surgery Right 2017   TONSILLECTOMY  1956   WISDOM TOOTH EXTRACTION      Allergies  Allergen Reactions   Spironolactone     Cramps, dizziness   Cayenne Other (See Comments)    Sweats w/paprika too   Niacin And Related Other (See Comments)    Upset stomach   Sulfonamide Derivatives Other (See Comments)    "have no idea; mother told me I was allergic to"    Ht 5\' 11"  (1.803 m)   Wt 192 lb (87.1 kg)   BMI 26.78 kg/m   [place] Adult Exercise 09/18/2021 11/05/2021  Frequency of aerobic exercise (# of days/week) 4 4  Average time in minutes 25 25  Frequency of strengthening activities  (# of days/week) 2 2    No flowsheet data found.      Objective:  Physical Exam:  Gen: Well-appearing, in no acute distress; non-toxic CV: Well-perfused. Warm.  Resp: Breathing unlabored on room air; no wheezing. Psych: Fluid speech in conversation; appropriate affect; normal thought process Neuro: Sensation intact throughout. No gross coordination deficits.  MSK:  - Left anterior costal cage: + prominent rib 10 that protrudes anteriorly. + Mild TTP over this rib area. No flucutance or masses palpated. Skin does not reveal any overlying erythema, ecchymosis or warmth.  Symmetric rib movement bilaterally although with  left rib 10 proved treating more so with inhalation.  Full range of motion bilateral upper extremities with intact strength.  MSK Limited rib ultrasound performed, left costal cage  -Long axis view of ribs 9 through 12 are identified without evidence of fracture or notable hyperemia. -Rib 10 was identified in both short and long axis without evidence of fracture or bony abnormality, there is a very faint area of hypoechoic fluid surrounding the superior aspect of rib 10, possibly suggestive of some overlying edema although no specific hyperemia or cortical irregularity.  No lipomas, masses or cyst identified in this area.   IMPRESSION: Small area of hypoechoic fluid surrounding the superior aspect of the rib, possibly suggestive of overlying edema although no clear evidence of rib fracture noted on limited ultrasound exam.    Assessment & Plan:  1. Left-sided rib pain 2. Somatic dysfunction of rib cage  There is a mild protrusion of rib 10, more specific on inhalation that is stuck in a superior direction.  No evidence on exam or ultrasound of rib fracture.  Believe this is moreso somatic dysfunction of the rib from provocative workout at the gym.  -Muscle energy performed to the left costal cage today -Patient may apply heat, topical Voltaren gel over this area if  painful -Flexion extension home exercises provided for the thoracic spine/costal cage -Discussed modifying activity to avoid heavy overhead lifting and twisting motions until this rib settles down -Expect this to improve over the next few weeks.  If it does not, could consider x-ray or advanced imaging of the ribs to rule out stress fracture, although I am more suspicious for somatic dysfunction -If not improved would consider sending to Dr. Hulan Saas for OMT treatment -Patient will call to let me know how he is feeling in a few weeks  Elba Barman, DO PGY-4, [place]  I was the preceptor for this visit and available for immediate consultation. Shellia Cleverly, DO

## 2021-11-12 ENCOUNTER — Ambulatory Visit: Payer: PPO | Admitting: Sports Medicine

## 2021-11-13 ENCOUNTER — Telehealth (INDEPENDENT_AMBULATORY_CARE_PROVIDER_SITE_OTHER): Payer: PPO | Admitting: Family Medicine

## 2021-11-13 ENCOUNTER — Encounter: Payer: Self-pay | Admitting: Family Medicine

## 2021-11-13 VITALS — Temp 98.7°F

## 2021-11-13 DIAGNOSIS — R059 Cough, unspecified: Secondary | ICD-10-CM | POA: Diagnosis not present

## 2021-11-13 DIAGNOSIS — R0981 Nasal congestion: Secondary | ICD-10-CM

## 2021-11-13 MED ORDER — BENZONATATE 100 MG PO CAPS: ORAL_CAPSULE | ORAL | 0 refills | Status: DC

## 2021-11-13 MED ORDER — DOXYCYCLINE HYCLATE 100 MG PO TABS: 100.0000 mg | ORAL_TABLET | Freq: Two times a day (BID) | ORAL | 0 refills | Status: DC

## 2021-11-13 NOTE — Patient Instructions (Signed)
-  I sent the medication(s) we discussed to your pharmacy: Meds ordered this encounter  Medications   benzonatate (TESSALON PERLES) 100 MG capsule    Sig: 1-2 capsule up to twice daily for cough    Dispense:  40 capsule    Refill:  0   doxycycline (VIBRA-TABS) 100 MG tablet    Sig: Take 1 tablet (100 mg total) by mouth 2 (two) times daily.    Dispense:  14 tablet    Refill:  0   Try the nasal saline and the cough medication first. If these measures are not working or you are worsening you can start the antibiotic (doxyxycline) as we discussed.   I hope you are feeling better soon!  Seek in person care promptly if your symptoms worsen, new concerns arise or you are not improving with treatment.  It was nice to meet you today. I help [facility] out with telemedicine visits on Tuesdays and Thursdays and am happy to help if you need a virtual follow up visit on those days. Otherwise, if you have any concerns or questions following this visit please schedule a follow up visit with your Primary Care office or seek care at a local urgent care clinic to avoid delays in care

## 2021-11-13 NOTE — Progress Notes (Signed)
Virtual Visit via Video Note  I connected with Russell Griffith  on 11/13/21 at 10:20 AM EST by a video enabled telemedicine application and verified that I am speaking with the correct person using two identifiers.  Location patient: home, [place] Location provider:work or home office Persons participating in the virtual visit: patient, provider  I discussed the limitations of evaluation and management by telemedicine and the availability of in person appointments. The patient expressed understanding and agreed to proceed.   HPI:  Acute telemedicine visit for a cough and sinus issues: -Onset: a little over 1 week ago -reports negative covid testing -Symptoms include: cough, congestion, sinus discomfort - now worsening today, eye irritated and had drainage from eyes - his opthalmologist called in an abx for the eyes and the eyes are improving -Denies: fever, body aches, NVD, inability to eat/drink/get out of bed -Has tried: several OTC cough medications -Pertinent past medical history: see below - reports is prone to sinus infections -Pertinent medication allergies:  Allergies  Allergen Reactions   Spironolactone     Cramps, dizziness   Cayenne Other (See Comments)    Sweats w/paprika too   Niacin And Related Other (See Comments)    Upset stomach   Sulfonamide Derivatives Other (See Comments)    "have no idea; mother told me I was allergic to"  -COVID-19 vaccine status:  Immunization History  Administered Date(s) Administered   Fluad Quad(high Dose 65+) 08/23/2019, 09/19/2020   Influenza Whole 08/31/2009   Influenza, High Dose Seasonal PF 08/12/2017, 09/23/2021   Influenza,inj,Quad PF,6+ Mos 08/06/2016   Influenza-Unspecified 08/24/2014, 08/30/2015   PFIZER Comirnaty(Gray Top)Covid-19 Tri-Sucrose Vaccine 04/24/2021   PFIZER(Purple Top)SARS-COV-2 Vaccination 12/17/2019, 01/07/2020   Pfizer Covid-19 Vaccine Bivalent Booster 14yrs & up 09/23/2021   Pneumococcal Conjugate-13 09/13/2013    Pneumococcal Polysaccharide-23 03/28/2009, 07/23/2015   Td 03/28/2009   Tdap 08/23/2019   Zoster Recombinat (Shingrix) 03/08/2018, 06/17/2018   Zoster, Live 09/13/2013    ROS: See pertinent positives and negatives per HPI.  Past Medical History:  Diagnosis Date   Anxiety    Aortic stenosis    Mild, echo, April, 2014   BPH (benign prostatic hyperplasia)    CAD (coronary artery disease)    a. 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 [facility]   Precancerous lesion    Forehead   Primary osteoarthritis of left knee    Mild   Prolapsed internal hemorrhoids, grade 3 08/12/2015   PVC's (premature ventricular contractions)    Tubular adenoma of colon     Past Surgical History:  Procedure Laterality Date   ABLATION     PVI at [place] N/A 01/25/2019   Procedure: [place];  Surgeon: [name], MD;  Location: [place] CV LAB;  Service: Cardiovascular;  Laterality: N/A;   CARDIOVERSION N/A 10/14/2018   Procedure: CARDIOVERSION;  Surgeon: Fay Records, MD;  Location[facility];  Service: Cardiovascular;  Laterality: N/A;   CARDIOVERSION N/A 08/18/2019   Procedure: CARDIOVERSION;  Surgeon: Marlou Porch,  Thana Farr, MD;  Location: [place];  Service: Cardiovascular;  Laterality: N/A;   COLONOSCOPY     CORONARY ARTERY BYPASS GRAFT  2000   CABG X5   CYSTOSCOPY WITH RETROGRADE PYELOGRAM, URETEROSCOPY AND STENT PLACEMENT Bilateral 09/04/2020   Procedure: CYSTOSCOPY WITH BILATERAL RETROGRADE PYELOGRAM, RIGHT URETEROSCOPY AND RIGHT  STENT PLACEMENT;  Surgeon: Franchot Gallo,  MD;  [facility];  Service: Urology;  Laterality: Bilateral;   ESOPHAGOGASTRODUODENOSCOPY     HEAD & NECK SKIN LESION EXCISIONAL BIOPSY     HEMORRHOID BANDING     INTRAVASCULAR PRESSURE WIRE/FFR STUDY N/A 01/05/2018   Procedure: INTRAVASCULAR PRESSURE WIRE/FFR STUDY;  Surgeon: Sherren Mocha, MD;  Location: [place] CV LAB;  Service: Cardiovascular;  Laterality: N/A;   LEFT HEART CATH AND CORS/GRAFTS ANGIOGRAPHY N/A 01/05/2018   Procedure: LEFT HEART CATH AND CORS/GRAFTS ANGIOGRAPHY;  Surgeon: Sherren Mocha, MD;  Location: [place] CV LAB;  Service: Cardiovascular;  Laterality: N/A;   PERMANENT PACEMAKER INSERTION N/A 07/15/2012   Procedure: PERMANENT PACEMAKER INSERTION;  Surgeon: Deboraha Sprang, MD;  Location: [facility] CATH LAB;  Service: Cardiovascular;  Laterality: N/A;   rotator cuff surgery Right 2017   TONSILLECTOMY  1956   WISDOM TOOTH EXTRACTION       Current Outpatient Medications:    acetaminophen (TYLENOL) 500 MG tablet, Take 1,000 mg by mouth every 6 (six) hours as needed for moderate pain or headache., Disp: , Rfl:    atorvastatin (LIPITOR) 20 MG tablet, TAKE 1 TABLET ONCE DAILY AT 6 PM., Disp: 90 tablet, Rfl: 2   benzonatate (TESSALON PERLES) 100 MG capsule, 1-2 capsule up to twice daily for cough, Disp: 40 capsule, Rfl: 0   Cholecalciferol 25 MCG (1000 UT) capsule, Take 1,000 Units by mouth daily with supper. , Disp: , Rfl:    doxycycline (VIBRA-TABS) 100 MG tablet, Take 1 tablet (100 mg total) by mouth 2 (two) times daily., Disp: 14 tablet, Rfl: 0   ELIQUIS 5 MG TABS tablet, TAKE 1 TABLET BY MOUTH TWICE DAILY., Disp: 60 tablet, Rfl: 11   lipase/protease/amylase (CREON) 36000 UNITS CPEP capsule, Take 1 capsule (36,000 Units total) by mouth 3 (three) times daily before meals. Take with the first bite of food, Disp: 180 capsule, Rfl: 3   loratadine (CLARITIN) 10 MG tablet, Take 1 tablet (10 mg total) by mouth daily., Disp: 30 tablet, Rfl: 11   moxifloxacin (VIGAMOX) 0.5 %  ophthalmic solution, as needed., Disp: , Rfl:    NONFORMULARY OR COMPOUNDED ITEM, Apply 1 application topically daily as needed (rash). Cetaphil + triamcinolone cream, Disp: , Rfl:    pantoprazole (PROTONIX) 40 MG tablet, TAKE 1 TABLET ONCE DAILY., Disp: 90 tablet, Rfl: 3   polyvinyl alcohol (LIQUIFILM TEARS) 1.4 % ophthalmic solution, Place 1 drop into both eyes every 8 (eight) hours as needed for dry eyes., Disp: , Rfl:    triamcinolone cream (KENALOG) 0.1 %, as needed., Disp: , Rfl:   EXAM:  VITALS per patient if applicable:  GENERAL: alert, oriented, appears well and in no acute distress  HEENT: atraumatic, conjunttiva clear, no obvious abnormalities on inspection of external nose and ears  NECK: normal movements of the head and neck  LUNGS: on inspection no signs of respiratory distress, breathing rate appears normal, no obvious gross SOB, gasping or wheezing  CV: no obvious cyanosis  MS: moves all visible extremities without noticeable abnormality  PSYCH/NEURO: pleasant and cooperative, no obvious depression or anxiety, speech and thought processing grossly intact  ASSESSMENT AND PLAN:  Discussed the following assessment and plan:  Nasal congestion  Cough, unspecified type  -we discussed possible serious and likely etiologies, options for evaluation and workup, limitations of telemedicine visit vs in person visit, treatment, treatment risks and precautions. Pt is agreeable to treatment via telemedicine at this moment. I suspect he probably has adenovirus which is currently circulating in the community  (along with flu, covid, RSV and many other viral infections) vs other. Opted to try Tessalon for cough, nasal saline, humidifier and if worsening or not improving as expected empiric doxy for possible 2ndary bacterial sinusitis (he was concerned for this.) Discussed difference between viral vs bacterial infections and risks with abx.  Advised to seek prompt in person care if  worsening, new symptoms arise, or if is not improving with treatment. Discussed options for inperson care if PCP office not available. Did let this patient know that I only do telemedicine on Tuesdays and Thursdays for [place]. Advised to schedule follow up visit with PCP or [facility] if any further questions or concerns to avoid delays in care.   I discussed the assessment and treatment plan with the patient. The patient was provided an opportunity to ask questions and all were answered. The patient agreed with the plan and demonstrated an understanding of the instructions.     Lucretia Kern, DO

## 2021-11-18 DIAGNOSIS — Z7901 Long term (current) use of anticoagulants: Secondary | ICD-10-CM | POA: Diagnosis not present

## 2021-11-18 DIAGNOSIS — I10 Essential (primary) hypertension: Secondary | ICD-10-CM | POA: Diagnosis not present

## 2021-11-18 DIAGNOSIS — K219 Gastro-esophageal reflux disease without esophagitis: Secondary | ICD-10-CM | POA: Diagnosis not present

## 2021-11-18 DIAGNOSIS — I4819 Other persistent atrial fibrillation: Secondary | ICD-10-CM | POA: Diagnosis not present

## 2021-11-18 DIAGNOSIS — Z950 Presence of cardiac pacemaker: Secondary | ICD-10-CM | POA: Diagnosis not present

## 2021-11-25 ENCOUNTER — Other Ambulatory Visit (INDEPENDENT_AMBULATORY_CARE_PROVIDER_SITE_OTHER): Payer: PPO

## 2021-11-25 ENCOUNTER — Other Ambulatory Visit: Payer: Self-pay

## 2021-11-25 DIAGNOSIS — R1013 Epigastric pain: Secondary | ICD-10-CM | POA: Diagnosis not present

## 2021-11-25 LAB — CBC WITH DIFFERENTIAL/PLATELET
Basophils Absolute: 0 10*3/uL (ref 0.0–0.1)
Basophils Relative: 0.7 % (ref 0.0–3.0)
Eosinophils Absolute: 0.1 10*3/uL (ref 0.0–0.7)
Eosinophils Relative: 1.2 % (ref 0.0–5.0)
HCT: 37.9 % — ABNORMAL LOW (ref 39.0–52.0)
Hemoglobin: 12.6 g/dL — ABNORMAL LOW (ref 13.0–17.0)
Lymphocytes Relative: 22.1 % (ref 12.0–46.0)
Lymphs Abs: 1.6 10*3/uL (ref 0.7–4.0)
MCHC: 33.4 g/dL (ref 30.0–36.0)
MCV: 85.2 fl (ref 78.0–100.0)
Monocytes Absolute: 1.2 10*3/uL — ABNORMAL HIGH (ref 0.1–1.0)
Monocytes Relative: 17.7 % — ABNORMAL HIGH (ref 3.0–12.0)
Neutro Abs: 4.1 10*3/uL (ref 1.4–7.7)
Neutrophils Relative %: 58.3 % (ref 43.0–77.0)
Platelets: 288 10*3/uL (ref 150.0–400.0)
RBC: 4.45 Mil/uL (ref 4.22–5.81)
RDW: 14.8 % (ref 11.5–15.5)
WBC: 7 10*3/uL (ref 4.0–10.5)

## 2021-11-25 LAB — AMYLASE: Amylase: 50 U/L (ref 27–131)

## 2021-11-25 LAB — COMPREHENSIVE METABOLIC PANEL
ALT: 29 U/L (ref 0–53)
AST: 28 U/L (ref 0–37)
Albumin: 3.9 g/dL (ref 3.5–5.2)
Alkaline Phosphatase: 117 U/L (ref 39–117)
BUN: 21 mg/dL (ref 6–23)
CO2: 28 mEq/L (ref 19–32)
Calcium: 9.4 mg/dL (ref 8.4–10.5)
Chloride: 104 mEq/L (ref 96–112)
Creatinine, Ser: 1.08 mg/dL (ref 0.40–1.50)
GFR: 67.91 mL/min (ref >60.00)
Glucose, Bld: 102 mg/dL — ABNORMAL HIGH (ref 70–99)
Potassium: 4.4 mEq/L (ref 3.5–5.1)
Sodium: 139 mEq/L (ref 135–145)
Total Bilirubin: 1.6 mg/dL — ABNORMAL HIGH (ref 0.2–1.2)
Total Protein: 6.9 g/dL (ref 6.0–8.3)

## 2021-11-25 LAB — LIPASE: Lipase: 69 U/L — ABNORMAL HIGH (ref 11.0–59.0)

## 2021-11-26 ENCOUNTER — Encounter: Payer: Self-pay | Admitting: Internal Medicine

## 2021-11-30 DIAGNOSIS — Z5189 Encounter for other specified aftercare: Secondary | ICD-10-CM

## 2021-11-30 HISTORY — DX: Encounter for other specified aftercare: Z51.89

## 2021-12-19 ENCOUNTER — Ambulatory Visit (INDEPENDENT_AMBULATORY_CARE_PROVIDER_SITE_OTHER): Payer: PPO

## 2021-12-19 DIAGNOSIS — Z950 Presence of cardiac pacemaker: Secondary | ICD-10-CM | POA: Diagnosis not present

## 2021-12-19 DIAGNOSIS — I495 Sick sinus syndrome: Secondary | ICD-10-CM | POA: Diagnosis not present

## 2021-12-19 DIAGNOSIS — Z7901 Long term (current) use of anticoagulants: Secondary | ICD-10-CM | POA: Diagnosis not present

## 2021-12-19 DIAGNOSIS — I4819 Other persistent atrial fibrillation: Secondary | ICD-10-CM | POA: Diagnosis not present

## 2021-12-19 DIAGNOSIS — I10 Essential (primary) hypertension: Secondary | ICD-10-CM | POA: Diagnosis not present

## 2021-12-19 LAB — CUP PACEART REMOTE DEVICE CHECK
Battery Remaining Longevity: 18 mo
Battery Remaining Percentage: 21 %
Brady Statistic RA Percent Paced: 0 %
Brady Statistic RV Percent Paced: 0 %
Date Time Interrogation Session: 20230120013200
Implantable Lead Implant Date: 20130816
Implantable Lead Implant Date: 20130816
Implantable Lead Location: 753859
Implantable Lead Location: 753860
Implantable Lead Model: 5076
Implantable Lead Model: 5076
Implantable Pulse Generator Implant Date: 20130816
Lead Channel Impedance Value: 519 Ohm
Lead Channel Impedance Value: 931 Ohm
Lead Channel Pacing Threshold Amplitude: 0.9 V
Lead Channel Pacing Threshold Pulse Width: 0.4 ms
Lead Channel Setting Pacing Amplitude: 2 V
Lead Channel Setting Sensing Sensitivity: 2.5 mV
Pulse Gen Serial Number: 111255

## 2021-12-21 NOTE — Progress Notes (Signed)
Cardiology Office Note   Date:  12/23/2021   ID:  Russell Griffith, DOB 1946-02-24, MRN [id number]  PCP:  Cassandria Anger, MD  Cardiologist:   Minus Breeding, MD    Chief Complaint  Patient presents with   Fatigue   Dizziness     History of Present Illness: Russell Griffith is a 76 y.o. male who presents for evaluation of known CAD.   He is well known to Dr. Ron Parker and Dr. Caryl Comes.    He has a history of coronary disease with his last catheterization as visualized below in 2019.  He has had 2 ablations 1 at [facility] and most recently by Dr. Rayann Heman but has persistent atrial fibrillation.  He has been previously on Tikosyn.  He did not tolerate Multaq.  He has had persistent atrial fibrillation.  He has had issues with chronic fatigue.  This has been about 2 to 3 years.  He is short of breath with activity although he is probably not been as good shape as he had been previously.  He does have a lot of stress with his family and with projects he is doing in town.  He has had problems with midepigastric discomfort.  This is been going on for a while and he does see his gastroenterologist.  For a while they thought it might have been Altace and he was switched to losartan.  He did not have any improvement in his abdominal complaints.  He stopped them both completely and still had no improvement.  He went back to taking the Altace.  He feels tightness or discomfort in his mid epigastrium that actually improves with exercise.  He is not having any resting shortness of breath, PND or orthopnea.  He is not really noticing his fibrillation.  He said no chest pressure, neck or arm discomfort.  He does have dizziness when he changes positions.   Past Medical History:  Diagnosis Date   Anxiety    Aortic stenosis    Mild, echo, April, 2014   BPH (benign prostatic hyperplasia)    CAD (coronary artery disease)    a. 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 s/p PVI at [facility]   Precancerous lesion    Forehead   Primary osteoarthritis of left knee    Mild   Prolapsed internal hemorrhoids, grade 3 08/12/2015   PVC's (premature ventricular contractions)    Tubular adenoma of colon     Past Surgical History:  Procedure Laterality Date   ABLATION     PVI at [place] N/A 01/25/2019   Procedure: [place];  Surgeon: [name], MD;  Location: [place] [facility];  Service: Cardiovascular;  Laterality: N/A;   CARDIOVERSION N/A 10/14/2018   Procedure: CARDIOVERSION;  Surgeon: Fay Records, MD;  Location: [place];  Service: Cardiovascular;  Laterality: N/A;   CARDIOVERSION N/A 08/18/2019   Procedure: CARDIOVERSION;  Surgeon: Jerline Pain, MD;  Location: [place];  Service: Cardiovascular;  Laterality:  N/A;   COLONOSCOPY     CORONARY ARTERY BYPASS GRAFT  2000   CABG X5   CYSTOSCOPY WITH RETROGRADE PYELOGRAM, URETEROSCOPY AND STENT PLACEMENT Bilateral 09/04/2020   Procedure: CYSTOSCOPY WITH BILATERAL RETROGRADE PYELOGRAM, RIGHT URETEROSCOPY AND RIGHT  STENT PLACEMENT;  Surgeon: Franchot Gallo, MD;  Location: [facility];  Service: Urology;  Laterality: Bilateral;   ESOPHAGOGASTRODUODENOSCOPY     HEAD & NECK SKIN LESION EXCISIONAL BIOPSY     HEMORRHOID BANDING     INTRAVASCULAR PRESSURE WIRE/FFR STUDY N/A 01/05/2018   Procedure: INTRAVASCULAR PRESSURE WIRE/FFR STUDY;  Surgeon: Sherren Mocha, MD;  Location: [place] [facility];  Service: Cardiovascular;  Laterality: N/A;   LEFT HEART CATH AND CORS/GRAFTS ANGIOGRAPHY N/A 01/05/2018   Procedure: LEFT HEART CATH AND CORS/GRAFTS  ANGIOGRAPHY;  Surgeon: Sherren Mocha, MD;  Location: [place] [facility];  Service: Cardiovascular;  Laterality: N/A;   PERMANENT PACEMAKER INSERTION N/A 07/15/2012   Procedure: PERMANENT PACEMAKER INSERTION;  Surgeon: Deboraha Sprang, MD;  Location: [facility];  Service: Cardiovascular;  Laterality: N/A;   rotator cuff surgery Right 2017   TONSILLECTOMY  1956   WISDOM TOOTH EXTRACTION       Current Outpatient Medications  Medication Sig Dispense Refill   acetaminophen (TYLENOL) 500 MG tablet Take 1,000 mg by mouth every 6 (six) hours as needed for moderate pain or headache.     atorvastatin (LIPITOR) 20 MG tablet TAKE 1 TABLET ONCE DAILY AT 6 PM. 90 tablet 2   Cholecalciferol 25 MCG (1000 UT) capsule Take 1,000 Units by mouth daily with supper.      ELIQUIS 5 MG TABS tablet TAKE 1 TABLET BY MOUTH TWICE DAILY. 60 tablet 11   NONFORMULARY OR COMPOUNDED ITEM Apply 1 application topically daily as needed (rash). Cetaphil + triamcinolone cream     pantoprazole (PROTONIX) 40 MG tablet TAKE 1 TABLET ONCE DAILY. 90 tablet 3   polyvinyl alcohol (LIQUIFILM TEARS) 1.4 % ophthalmic solution Place 1 drop into both eyes every 8 (eight) hours as needed for dry eyes.     ramipril (ALTACE) 2.5 MG capsule Take 2.5 mg by mouth daily.     benzonatate (TESSALON PERLES) 100 MG capsule 1-2 capsule up to twice daily for cough 40 capsule 0   doxycycline (VIBRA-TABS) 100 MG tablet Take 1 tablet (100 mg total) by mouth 2 (two) times daily. 14 tablet 0   loratadine (CLARITIN) 10 MG tablet Take 1 tablet (10 mg total) by mouth daily. (Patient not taking: Reported on 12/22/2021) 30 tablet 11   moxifloxacin (VIGAMOX) 0.5 % ophthalmic solution as needed.     triamcinolone cream (KENALOG) 0.1 % as needed.     No current facility-administered medications for this visit.    Allergies:   Spironolactone, Cayenne, Niacin and related, and Sulfonamide derivatives    ROS:  Please see the history of present illness.    Otherwise, review of systems are positive for urinary frequency.   All other systems are reviewed and negative.    PHYSICAL EXAM: VS:  BP 122/70    Pulse 84    Ht 5\' 11"  (1.803 m)    Wt 195 lb 6.4 oz (88.6 kg)    SpO2 98%    BMI 27.25 kg/m  , BMI Body mass index is 27.25 kg/m. GENERAL:  Well appearing NECK:  No jugular venous distention, waveform within normal limits, carotid upstroke brisk and symmetric, no bruits, no thyromegaly LUNGS:  Clear to auscultation bilaterally CHEST:  Well healed sternotomy scar.  HEART:  PMI not displaced or sustained,S1 and S2 within normal limits, no S3, no S4, no clicks, no rubs, no murmurs ABD:  Flat, positive bowel sounds normal in frequency in pitch, no bruits, no rebound, no guarding, no midline pulsatile mass, no hepatomegaly, no splenomegaly EXT:  2 plus pulses throughout, no edema, no cyanosis no clubbing   EKG:  EKG is not ordered today.  Recent Labs: 09/29/2021: TSH 4.55 11/25/2021: ALT 29; BUN 21; Creatinine, Ser 1.08; Hemoglobin 12.6; Platelets 288.0; Potassium 4.4; Sodium 139    Lipid Panel    Component Value Date/Time   CHOL 107 09/29/2021 1028   TRIG 56.0 09/29/2021 1028   HDL 32.70 (L) 09/29/2021 1028   CHOLHDL 3 09/29/2021 1028   VLDL 11.2 09/29/2021 1028   LDLCALC 63 09/29/2021 1028      Wt Readings from Last 3 Encounters:  12/22/21 195 lb 6.4 oz (88.6 kg)  11/05/21 192 lb (87.1 kg)  10/31/21 201 lb 12.8 oz (91.5 kg)    Diagnostic Dominance: Left      Other studies Reviewed: Additional studies/ records that were reviewed today include: Labs, EP studies Review of the above records demonstrates:     ASSESSMENT AND PLAN:  ATRIAL FIB:   CHADS VASC score is 3.  He tolerates this rhythm.  He tolerates anticoagulation.  No change in therapy. \  PACEMAKER PLACEMENT:    On his last device check he had 10 AF with RVR lasting 5 - 15 seocnds.  He is up-to-date with follow-up.  I did review the most recent  interrogation.  CAD:   I did review his 2019 catheterization.  He is not having any anginal symptoms.  He will continue with risk reduction.    CAROTID STENOSIS:   He had no evidence of obstructive disease on angiogram in the past.  No change in therapy.  DYSPNEA: This has been at baseline.  Has been under stress.  He is probably not been doing as much aerobic exercise.  We discussed this.  I would not suggest further work-up.   AS:   He has mild AS.  No change in therapy.      DYSLIPIDEMIA: LDL was 63 with an HDL of 32.  This was in October.  No change in therapy.  ABDOMINAL DISCOMFORT:  I have suggested that he could try a 1 month Lipitor holiday to see if this makes any difference.  If it does we would have to switch to PCSK9 inhibitor.  DIZZINESS: He was not orthostatic in the office today.  I suggested maybe follow-up with ENT  Current medicines are reviewed at length with the patient today.  The patient does not have concerns regarding medicines.  The following changes have been made: As above  Labs/ tests ordered today include:  None  Orders Placed This Encounter  Procedures   Iron, TIBC and Ferritin Panel   B12 and Folate Panel     Disposition:   FU with me in Sept.  Signed, Minus Breeding, MD  12/23/2021 5:12 PM    [place]

## 2021-12-22 ENCOUNTER — Other Ambulatory Visit: Payer: Self-pay

## 2021-12-22 ENCOUNTER — Ambulatory Visit (INDEPENDENT_AMBULATORY_CARE_PROVIDER_SITE_OTHER): Payer: PPO | Admitting: Cardiology

## 2021-12-22 ENCOUNTER — Encounter: Payer: Self-pay | Admitting: Cardiology

## 2021-12-22 VITALS — BP 122/70 | HR 84 | Ht 71.0 in | Wt 195.4 lb

## 2021-12-22 DIAGNOSIS — I480 Paroxysmal atrial fibrillation: Secondary | ICD-10-CM | POA: Diagnosis not present

## 2021-12-22 DIAGNOSIS — R5383 Other fatigue: Secondary | ICD-10-CM

## 2021-12-22 DIAGNOSIS — I2510 Atherosclerotic heart disease of native coronary artery without angina pectoris: Secondary | ICD-10-CM

## 2021-12-22 DIAGNOSIS — E785 Hyperlipidemia, unspecified: Secondary | ICD-10-CM

## 2021-12-22 NOTE — Patient Instructions (Addendum)
Medication Instructions:  Your physician recommends that you continue on your current medications as directed. Please refer to the Current Medication list given to you today.   *If you need a refill on your cardiac medications before your next appointment, please call your pharmacy*  Lab Work: [place]   If you have labs (blood work) drawn today and your tests are completely normal, you will receive your results only by: [place] (if you have MyChart) OR A paper copy in the mail If you have any lab test that is abnormal or we need to change your treatment, we will call you to review the results.  Testing/Procedures: NONE  Follow-Up: At [facility], you and your health needs are our priority.  As part of our continuing mission to provide you with exceptional heart care, we have created designated Provider Care Teams.  These Care Teams include your primary Cardiologist (physician) and Advanced Practice Providers (APPs -  Physician Assistants and Nurse Practitioners) who all work together to provide you with the care you need, when you need it.  We recommend signing up for the patient portal called "MyChart".  Sign up information is provided on this After Visit Summary.  MyChart is used to connect with patients for Virtual Visits (Telemedicine).  Patients are able to view lab/test results, encounter notes, upcoming appointments, etc.  Non-urgent messages can be sent to your provider as well.   To learn more about what you can do with MyChart, go to [email or web address].    Your next appointment:   8 month(s)  The format for your next appointment:   In Person  Provider:   DR [facility]  :1}

## 2021-12-23 ENCOUNTER — Encounter: Payer: Self-pay | Admitting: Cardiology

## 2021-12-23 LAB — B12 AND FOLATE PANEL
Folate: 11.3 ng/mL (ref >3.0)
Vitamin B-12: 599 pg/mL (ref 232–1245)

## 2021-12-23 LAB — IRON,TIBC AND FERRITIN PANEL
Ferritin: 24 ng/mL — ABNORMAL LOW (ref 30–400)
Iron Saturation: 11 % — ABNORMAL LOW (ref 15–55)
Iron: 46 ug/dL (ref 38–169)
Total Iron Binding Capacity: 432 ug/dL (ref 250–450)
UIBC: 386 ug/dL — ABNORMAL HIGH (ref 111–343)

## 2022-01-01 NOTE — Progress Notes (Signed)
Remote pacemaker transmission.   

## 2022-01-05 ENCOUNTER — Other Ambulatory Visit: Payer: Self-pay

## 2022-01-05 ENCOUNTER — Ambulatory Visit (INDEPENDENT_AMBULATORY_CARE_PROVIDER_SITE_OTHER): Payer: PPO

## 2022-01-05 DIAGNOSIS — Z0000 Encounter for general adult medical examination without abnormal findings: Secondary | ICD-10-CM

## 2022-01-05 NOTE — Progress Notes (Signed)
I connected with Russell Griffith today by telephone and verified that I am speaking with the correct person using two identifiers. Location patient: home Location provider: work Persons participating in the virtual visit: patient, provider.   I discussed the limitations, risks, security and privacy concerns of performing an evaluation and management service by telephone and the availability of in person appointments. I also discussed with the patient that there may be a patient responsible charge related to this service. The patient expressed understanding and verbally consented to this telephonic visit.    Interactive audio and video telecommunications were attempted between this provider and patient, however failed, due to patient having technical difficulties OR patient did not have access to video capability.  We continued and completed visit with audio only.  Some vital signs may be absent or patient reported.   Time Spent with patient on telephone encounter: 40 minutes  Subjective:   Russell Griffith is a 76 y.o. male who presents for Medicare Annual/Subsequent preventive examination.  Review of Systems     Cardiac Risk Factors include: advanced age (>45mn, >>81women);family history of premature cardiovascular disease;dyslipidemia;male gender;hypertension     Objective:    Today's Vitals   01/05/22 1303  PainSc: 0-No pain   There is no height or weight on file to calculate BMI.  Advanced Directives 01/05/2022 07/27/2021 11/15/2020 09/04/2020 08/29/2020 10/06/2019 08/18/2019  Does Patient Have a Medical Advance Directive? Yes No Yes Yes Yes Yes Yes  Type of Advance Directive Living will;Healthcare Power of [place] will [place] will [place]Living will [place]icily IslandLiving will [place]ulge[place] will  Does patient want to make changes to medical advance directive?  No - Patient declined - No - Patient declined - - - -  Copy of [place] Chart? No - copy requested - No - copy requested - Yes - validated most recent copy scanned in chart (See row information) No - copy requested No - copy requested  Would patient like information on creating a medical advance directive? - No - Patient declined - - - - -    Current Medications (verified) Outpatient Encounter Medications as of 01/05/2022  Medication Sig   losartan (COZAAR) 25 MG tablet Take 25 mg by mouth daily.   acetaminophen (TYLENOL) 500 MG tablet Take 1,000 mg by mouth every 6 (six) hours as needed for moderate pain or headache.   atorvastatin (LIPITOR) 20 MG tablet TAKE 1 TABLET ONCE DAILY AT 6 PM.   Cholecalciferol 25 MCG (1000 UT) capsule Take 1,000 Units by mouth daily with supper.    ELIQUIS 5 MG TABS tablet TAKE 1 TABLET BY MOUTH TWICE DAILY.   loratadine (CLARITIN) 10 MG tablet Take 1 tablet (10 mg total) by mouth daily. (Patient not taking: Reported on 12/22/2021)   NONFORMULARY OR COMPOUNDED ITEM Apply 1 application topically daily as needed (rash). Cetaphil + triamcinolone cream   pantoprazole (PROTONIX) 40 MG tablet TAKE 1 TABLET ONCE DAILY.   polyvinyl alcohol (LIQUIFILM TEARS) 1.4 % ophthalmic solution Place 1 drop into both eyes every 8 (eight) hours as needed for dry eyes.   ramipril (ALTACE) 2.5 MG capsule Take 2.5 mg by mouth daily. (Patient not taking: Reported on 01/05/2022)   [DISCONTINUED] benzonatate (TESSALON PERLES) 100 MG capsule 1-2 capsule up to twice daily for cough   [DISCONTINUED] doxycycline (VIBRA-TABS) 100 MG tablet Take 1 tablet (100 mg total) by mouth 2 (two)  times daily.   [DISCONTINUED] moxifloxacin (VIGAMOX) 0.5 % ophthalmic solution as needed.   [DISCONTINUED] triamcinolone cream (KENALOG) 0.1 % as needed.   No facility-administered encounter medications on file as of 01/05/2022.    Allergies (verified) Spironolactone, Cayenne, Niacin and  related, and Sulfonamide derivatives   History: Past Medical History:  Diagnosis Date   Anxiety    Aortic stenosis    Mild, echo, April, 2014   BPH (benign prostatic hyperplasia)    CAD (coronary artery disease)    a. 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 s/p PVI at [facility]   Precancerous lesion    Forehead   Primary osteoarthritis of left knee    Mild   Prolapsed internal hemorrhoids, grade 3 08/12/2015   PVC's (premature ventricular contractions)    Tubular adenoma of colon    Past Surgical History:  Procedure Laterality Date   ABLATION     PVI at [place] N/A 01/25/2019   Procedure: [place];  Surgeon: [name], MD;  Location: [facility];  Service: Cardiovascular;  Laterality: N/A;   CARDIOVERSION N/A 10/14/2018   Procedure: CARDIOVERSION;  Surgeon: Fay Records, MD;  Location: [facility];  Service: Cardiovascular;  Laterality: N/A;   CARDIOVERSION N/A 08/18/2019   Procedure: CARDIOVERSION;  Surgeon: Jerline Pain, MD;  Location: [facility];  Service: Cardiovascular;  Laterality: N/A;   COLONOSCOPY     CORONARY ARTERY BYPASS GRAFT  2000   CABG X5   CYSTOSCOPY WITH RETROGRADE PYELOGRAM, URETEROSCOPY AND STENT PLACEMENT Bilateral 09/04/2020   Procedure: CYSTOSCOPY WITH BILATERAL RETROGRADE PYELOGRAM, RIGHT URETEROSCOPY AND RIGHT  STENT PLACEMENT;  Surgeon: Franchot Gallo, MD;  Location: [facility];  Service: Urology;  Laterality: Bilateral;   ESOPHAGOGASTRODUODENOSCOPY     HEAD & NECK SKIN LESION EXCISIONAL BIOPSY     HEMORRHOID BANDING     INTRAVASCULAR  PRESSURE WIRE/FFR STUDY N/A 01/05/2018   Procedure: INTRAVASCULAR PRESSURE WIRE/FFR STUDY;  Surgeon: Sherren Mocha, MD;  Location: [facility];  Service: Cardiovascular;  Laterality: N/A;   LEFT HEART CATH AND CORS/GRAFTS ANGIOGRAPHY N/A 01/05/2018   Procedure: LEFT HEART CATH AND CORS/GRAFTS ANGIOGRAPHY;  Surgeon: Sherren Mocha, MD;  Location: [facility];  Service: Cardiovascular;  Laterality: N/A;   PERMANENT PACEMAKER INSERTION N/A 07/15/2012   Procedure: PERMANENT PACEMAKER INSERTION;  Surgeon: Deboraha Sprang, MD;  [facility];  Service: Cardiovascular;  Laterality: N/A;   rotator cuff surgery Right 2017   TONSILLECTOMY  1956   WISDOM TOOTH EXTRACTION     Family History  Problem Relation Age of Onset   Coronary artery disease Other 76   Diabetes Other    Multiple myeloma Mother    Diabetes Father    Renal Disease Father    Hyperlipidemia Other    Hypertension Other    Colon cancer Neg Hx    Esophageal cancer Neg Hx    Stomach cancer Neg Hx    Rectal cancer Neg Hx    Social  History   Socioeconomic History   Marital status: Married    Spouse name: Not on file   Number of children: 2   Years of education: 50   Highest education level: Not on file  Occupational History   Occupation: Technical brewer: [employer]    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  2 daughters-'75, '79; 1 grandchild; step-daughter and step grandson.  Regular exercise-yes, runs 1.5-2 mi 4x/wk, also eliptical       Patient signed a Designated Party Release to allow his wife, Nidal Rivet, to have access to his medical records/ information.   Social Determinants of Health   Financial Resource Strain: Low Risk    Difficulty of Paying Living Expenses: Not hard at all  Food Insecurity: No Food Insecurity   Worried About Charity fundraiser in the Last Year: Never true   [place] in the Last Year: Never true  Transportation Needs: No Transportation Needs   Lack of Transportation (Medical): No   Lack of Transportation (Non-Medical): No  Physical Activity: Sufficiently Active   Days of Exercise per Week: 7 days   Minutes of Exercise per Session: 60 min  Stress: No Stress Concern Present   Feeling of Stress : Not at all  Social Connections: Socially Integrated   Frequency of Communication with Friends and Family: More than three times a week   Frequency of Social Gatherings with Friends and Family: More than three times a week   Attends Religious Services: More than 4 times per year   Active Member of [employer] or Organizations: Yes   Attends Music therapist: More than 4 times per year   Marital Status: Married    Tobacco Counseling Counseling given: Not Answered   Clinical Intake:  Pre-visit preparation completed: No  Pain : No/denies pain Pain Score: 0-No pain     Nutritional Risks: None Diabetes: No  How often do you need to have someone help you when you read instructions, pamphlets, or other written materials from your doctor or pharmacy?: 1 - Never What is the last grade level you completed in school?: Master's Degree  Diabetic? no  Interpreter Needed?: No  Information entered by :: Lisette Abu, LPN   Activities of Daily Living In your present state of health, do you have any difficulty performing the following activities: 01/05/2022  Hearing? N  Vision? N  Difficulty concentrating or making decisions? N  Walking or climbing stairs? N  Dressing or  bathing? N  Doing errands, shopping? N  Preparing Food and eating ? N  Using the Toilet? N  In the past six months, have you accidently leaked urine? N  Do you have problems with loss of bowel control? N  Managing your Medications? N  Managing your Finances? N  Housekeeping or managing your Housekeeping? N  Some recent data might be hidden    Patient Care Team: [name], Evie Lacks, MD as PCP - General (Internal Medicine)  Deboraha Sprang, MD as PCP - Electrophysiology (Cardiology) Stefanie Libel, MD (Family Medicine) Deboraha Sprang, MD (Cardiology) Jarome Matin, MD as Consulting Physician (Dermatology) Milus Banister, MD as Attending Physician (Gastroenterology) Minus Breeding, MD as Consulting Physician (Cardiology) Franchot Gallo, MD as Consulting Physician (Urology) Luberta Mutter, MD as Consulting Physician (Ophthalmology)  Indicate any recent Medical Services you may have received from other than [facility] providers in the past year (date may be approximate).     Assessment:   This is a routine wellness examination for Patrich.  Hearing/Vision screen Hearing Screening - Comments:: Patient denied any hearing difficulty.   No hearing aids.  Vision Screening - Comments:: Patient wears readers.  Eye exam done annually by: Luberta Mutter, MD.  Dietary issues and exercise activities discussed: Current Exercise Habits: Home exercise routine, Type of exercise: treadmill;Other - see comments (ellipitical bike), Time (Minutes): 60, Frequency (Times/Week): 7, Weekly Exercise (Minutes/Week): 420, Intensity: Moderate, Exercise limited by: orthopedic condition(s)   Goals Addressed               This Visit's Progress     Patient Stated (pt-stated)        To maintain my current health status by continuing to eat healthy, stay physically active and socially active.      Depression Screen PHQ 2/9 Scores 01/05/2022 09/29/2021 11/15/2020 10/06/2019 08/23/2019 08/18/2018 06/29/2017   PHQ - 2 Score 0 0 0 0 0 0 0  PHQ- 9 Score - 0 - - - - -  Exception Documentation - - - - - - -    Fall Risk Fall Risk  01/05/2022 09/29/2021 11/15/2020 10/06/2019 08/18/2018  Falls in the past year? 0 0 0 0 No  Number falls in past yr: 0 0 0 0 -  Injury with Fall? 0 0 0 0 -  Risk for fall due to : No Fall Risks No Fall Risks No Fall Risks - -  Follow up Falls evaluation completed - Falls evaluation completed - -    FALL RISK PREVENTION PERTAINING TO THE HOME:  Any stairs in or around the home? Yes  If so, are there any without handrails? No  Home free of loose throw rugs in walkways, pet beds, electrical cords, etc? Yes  Adequate lighting in your home to reduce risk of falls? Yes   ASSISTIVE DEVICES UTILIZED TO PREVENT FALLS:  Life alert? No  Use of a cane, walker or w/c? No  Grab bars in the bathroom? No  Shower chair or bench in shower? Yes  Elevated toilet seat or a handicapped toilet? Yes   TIMED UP AND GO:  Was the test performed? No .  Length of time to ambulate 10 feet: n/a sec.   Gait steady and fast without use of assistive device (per patient)  Cognitive Function: Normal cognitive status assessed by direct observation by this Nurse Health Advisor. No abnormalities found.          Immunizations Immunization History  Administered Date(s) Administered   Fluad Quad(high Dose 65+) 08/23/2019, 09/19/2020   Influenza Whole 08/31/2009   Influenza, High Dose Seasonal PF 08/12/2017, 09/23/2021   Influenza,inj,Quad PF,6+ Mos 08/06/2016   Influenza-Unspecified 08/24/2014, 08/30/2015   PFIZER Comirnaty(Gray Top)Covid-19 Tri-Sucrose Vaccine 04/24/2021   PFIZER(Purple Top)SARS-COV-2 Vaccination 12/17/2019, 01/07/2020   Pfizer Covid-19 Vaccine Bivalent Booster 93yr & up 09/23/2021   Pneumococcal Conjugate-13 09/13/2013   Pneumococcal Polysaccharide-23 03/28/2009, 07/23/2015   Td 03/28/2009   Tdap 08/23/2019   Zoster Recombinat (Shingrix) 03/08/2018, 06/17/2018  Zoster, Live 09/13/2013    TDAP status: Up to date  Flu Vaccine status: Up to date  Pneumococcal vaccine status: Up to date  Covid-19 vaccine status: Completed vaccines  Qualifies for Shingles Vaccine? Yes   Zostavax completed Yes   Shingrix Completed?: Yes  Screening Tests Health Maintenance  Topic Date Due   COLONOSCOPY (Pts 45-10yr Insurance coverage will need to be confirmed)  06/26/2027   TETANUS/TDAP  08/22/2029   Pneumonia Vaccine 76 Years old  Completed   INFLUENZA VACCINE  Completed   COVID-19 Vaccine  Completed   Hepatitis C Screening  Completed   Zoster Vaccines- Shingrix  Completed   HPV VACCINES  Aged Out    Health Maintenance  There are no preventive care reminders to display for this patient.  Colorectal cancer screening: Type of screening: Colonoscopy. Completed 06/25/2020. Repeat every 7 years  Lung Cancer Screening: (Low Dose CT Chest recommended if Age 74-80years, 30 pack-year currently smoking OR have quit w/in 15years.) does not qualify.   Lung Cancer Screening Referral: no  Additional Screening:  Hepatitis C Screening: does qualify; Completed yes  Vision Screening: Recommended annual ophthalmology exams for early detection of glaucoma and other disorders of the eye. Is the patient up to date with their annual eye exam?  Yes  Who is the provider or what is the name of the office in which the patient attends annual eye exams? CLuberta Mutter MD. If pt is not established with a provider, would they like to be referred to a provider to establish care? No .   Dental Screening: Recommended annual dental exams for proper oral hygiene  Community Resource Referral / Chronic Care Management: CRR required this visit?  No   CCM required this visit?  No      Plan:     I have personally reviewed and noted the following in the patients chart:   Medical and social history Use of alcohol, tobacco or illicit drugs  Current medications and  supplements including opioid prescriptions. Patient is not currently taking opioid prescriptions. Functional ability and status Nutritional status Physical activity Advanced directives List of other physicians Hospitalizations, surgeries, and ER visits in previous 12 months Vitals Screenings to include cognitive, depression, and falls Referrals and appointments  In addition, I have reviewed and discussed with patient certain preventive protocols, quality metrics, and best practice recommendations. A written personalized care plan for preventive services as well as general preventive health recommendations were provided to patient.     SSheral Flow LPN   22/02/2352  Nurse Notes:  Patient is cogitatively intact. There were no vitals filed for this visit. There is no height or weight on file to calculate BMI. Patient stated that he has no issues with gait or balance; does not use any assistive devices.

## 2022-01-05 NOTE — Patient Instructions (Signed)
[name] , Thank you for taking time to come for your Medicare Wellness Visit. I appreciate your ongoing commitment to your health goals. Please review the following plan we discussed and let me know if I can assist you in the future.   Screening recommendations/referrals: Colonoscopy: 06/25/2020; due every 7 years (due 06/26/2027) Recommended yearly ophthalmology/optometry visit for glaucoma screening and checkup Recommended yearly dental visit for hygiene and checkup  Vaccinations: Influenza vaccine: 09/23/2021; due every Fall Season Pneumococcal vaccine: 09/13/2013, 07/23/2015 Tdap vaccine: 08/23/2019; due every 10 years (due 08/22/2029) Shingles vaccine: 03/08/2018, 06/17/2018   Covid-19: 12/17/2019, 01/07/2020, 04/24/2021  Advanced directives: Please bring a copy of your health care power of attorney and living will to the office at your convenience.  Conditions/risks identified: Yes; Client understands the importance of follow-up with providers by attending scheduled visits and discussed goals to eat healthier, increase physical activity, exercise the brain, socialize more, get enough sleep and make time for laughter.  Next appointment: 01/06/2023 at 1:00 pm telephone visit with Lisette Abu, LPN.  If need to reschedule or cancel please call [phone].  Preventive Care 74 Years and Older, Male Preventive care refers to lifestyle choices and visits with your health care provider that can promote health and wellness. What does preventive care include? A yearly physical exam. This is also called an annual well check. Dental exams once or twice a year. Routine eye exams. Ask your health care provider how often you should have your eyes checked. Personal lifestyle choices, including: Daily care of your teeth and gums. Regular physical activity. Eating a healthy diet. Avoiding tobacco and drug use. Limiting alcohol use. Practicing safe sex. Taking low doses of aspirin every  day. Taking vitamin and mineral supplements as recommended by your health care provider. What happens during an annual well check? The services and screenings done by your health care provider during your annual well check will depend on your age, overall health, lifestyle risk factors, and family history of disease. Counseling  Your health care provider may ask you questions about your: Alcohol use. Tobacco use. Drug use. Emotional well-being. Home and relationship well-being. Sexual activity. Eating habits. History of falls. Memory and ability to understand (cognition). Work and work Statistician. Screening  You may have the following tests or measurements: Height, weight, and BMI. Blood pressure. Lipid and cholesterol levels. These may be checked every 5 years, or more frequently if you are over 8 years old. Skin check. Lung cancer screening. You may have this screening every year starting at age 74 if you have a 30-pack-year history of smoking and currently smoke or have quit within the past 15 years. Fecal occult blood test (FOBT) of the stool. You may have this test every year starting at age 74. Flexible sigmoidoscopy or colonoscopy. You may have a sigmoidoscopy every 5 years or a colonoscopy every 10 years starting at age 74. Prostate cancer screening. Recommendations will vary depending on your family history and other risks. Hepatitis C blood test. Hepatitis B blood test. Sexually transmitted disease (STD) testing. Diabetes screening. This is done by checking your blood sugar (glucose) after you have not eaten for a while (fasting). You may have this done every 1-3 years. Abdominal aortic aneurysm (AAA) screening. You may need this if you are a current or former smoker. Osteoporosis. You may be screened starting at age 74 if you are at high risk. Talk with your health care provider about your test results, treatment options, and if necessary, the need for  more  tests. Vaccines  Your health care provider may recommend certain vaccines, such as: Influenza vaccine. This is recommended every year. Tetanus, diphtheria, and acellular pertussis (Tdap, Td) vaccine. You may need a Td booster every 10 years. Zoster vaccine. You may need this after age 68. Pneumococcal 13-valent conjugate (PCV13) vaccine. One dose is recommended after age 74. Pneumococcal polysaccharide (PPSV23) vaccine. One dose is recommended after age 74. Talk to your health care provider about which screenings and vaccines you need and how often you need them. This information is not intended to replace advice given to you by your health care provider. Make sure you discuss any questions you have with your health care provider. Document Released: 12/13/2015 Document Revised: 08/05/2016 Document Reviewed: 09/17/2015 Elsevier Interactive Patient Education  2017 [place] Prevention in the Home Falls can cause injuries. They can happen to people of all ages. There are many things you can do to make your home safe and to help prevent falls. What can I do on the outside of my home? Regularly fix the edges of walkways and driveways and fix any cracks. Remove anything that might make you trip as you walk through a door, such as a raised step or threshold. Trim any bushes or trees on the path to your home. Use bright outdoor lighting. Clear any walking paths of anything that might make someone trip, such as rocks or tools. Regularly check to see if handrails are loose or broken. Make sure that both sides of any steps have handrails. Any raised decks and porches should have guardrails on the edges. Have any leaves, snow, or ice cleared regularly. Use sand or salt on walking paths during winter. Clean up any spills in your garage right away. This includes oil or grease spills. What can I do in the bathroom? Use night lights. Install grab bars by the toilet and in the tub and shower.  Do not use towel bars as grab bars. Use non-skid mats or decals in the tub or shower. If you need to sit down in the shower, use a plastic, non-slip stool. Keep the floor dry. Clean up any water that spills on the floor as soon as it happens. Remove soap buildup in the tub or shower regularly. Attach bath mats securely with double-sided non-slip rug tape. Do not have throw rugs and other things on the floor that can make you trip. What can I do in the bedroom? Use night lights. Make sure that you have a light by your bed that is easy to reach. Do not use any sheets or blankets that are too big for your bed. They should not hang down onto the floor. Have a firm chair that has side arms. You can use this for support while you get dressed. Do not have throw rugs and other things on the floor that can make you trip. What can I do in the [place]? Clean up any spills right away. Avoid walking on wet floors. Keep items that you use a lot in easy-to-reach places. If you need to reach something above you, use a strong step stool that has a grab bar. Keep electrical cords out of the way. Do not use floor polish or wax that makes floors slippery. If you must use wax, use non-skid floor wax. Do not have throw rugs and other things on the floor that can make you trip. What can I do with my stairs? Do not leave any items on the stairs. Make  sure that there are handrails on both sides of the stairs and use them. Fix handrails that are broken or loose. Make sure that handrails are as long as the stairways. Check any carpeting to make sure that it is firmly attached to the stairs. Fix any carpet that is loose or worn. Avoid having throw rugs at the top or bottom of the stairs. If you do have throw rugs, attach them to the floor with carpet tape. Make sure that you have a light switch at the top of the stairs and the bottom of the stairs. If you do not have them, ask someone to add them for you. What else  can I do to help prevent falls? Wear shoes that: Do not have high heels. Have rubber bottoms. Are comfortable and fit you well. Are closed at the toe. Do not wear sandals. If you use a stepladder: Make sure that it is fully opened. Do not climb a closed stepladder. Make sure that both sides of the stepladder are locked into place. Ask someone to hold it for you, if possible. Clearly mark and make sure that you can see: Any grab bars or handrails. First and last steps. Where the edge of each step is. Use tools that help you move around (mobility aids) if they are needed. These include: Canes. Walkers. Scooters. Crutches. Turn on the lights when you go into a dark area. Replace any light bulbs as soon as they burn out. Set up your furniture so you have a clear path. Avoid moving your furniture around. If any of your floors are uneven, fix them. If there are any pets around you, be aware of where they are. Review your medicines with your doctor. Some medicines can make you feel dizzy. This can increase your chance of falling. Ask your doctor what other things that you can do to help prevent falls. This information is not intended to replace advice given to you by your health care provider. Make sure you discuss any questions you have with your health care provider. Document Released: 09/12/2009 Document Revised: 04/23/2016 Document Reviewed: 12/21/2014 Elsevier Interactive Patient Education  2017 [employer].

## 2022-01-23 ENCOUNTER — Other Ambulatory Visit: Payer: Self-pay

## 2022-01-23 ENCOUNTER — Telehealth: Payer: PPO | Admitting: Nurse Practitioner

## 2022-01-23 DIAGNOSIS — R5383 Other fatigue: Secondary | ICD-10-CM | POA: Diagnosis not present

## 2022-01-23 DIAGNOSIS — R17 Unspecified jaundice: Secondary | ICD-10-CM | POA: Diagnosis not present

## 2022-01-23 DIAGNOSIS — R197 Diarrhea, unspecified: Secondary | ICD-10-CM | POA: Diagnosis not present

## 2022-01-27 ENCOUNTER — Encounter: Payer: Self-pay | Admitting: Internal Medicine

## 2022-01-27 ENCOUNTER — Other Ambulatory Visit: Payer: Self-pay

## 2022-01-27 ENCOUNTER — Ambulatory Visit (INDEPENDENT_AMBULATORY_CARE_PROVIDER_SITE_OTHER): Payer: PPO | Admitting: Internal Medicine

## 2022-01-27 DIAGNOSIS — R17 Unspecified jaundice: Secondary | ICD-10-CM | POA: Diagnosis not present

## 2022-01-27 LAB — CBC WITH DIFFERENTIAL/PLATELET
Basophils Absolute: 0.1 10*3/uL (ref 0.0–0.1)
Basophils Relative: 0.9 % (ref 0.0–3.0)
Eosinophils Absolute: 0 10*3/uL (ref 0.0–0.7)
Eosinophils Relative: 0.2 % (ref 0.0–5.0)
HCT: 36.7 % — ABNORMAL LOW (ref 39.0–52.0)
Hemoglobin: 11.7 g/dL — ABNORMAL LOW (ref 13.0–17.0)
Lymphocytes Relative: 7.9 % — ABNORMAL LOW (ref 12.0–46.0)
Lymphs Abs: 1 10*3/uL (ref 0.7–4.0)
MCHC: 32 g/dL (ref 30.0–36.0)
MCV: 80.5 fl (ref 78.0–100.0)
Monocytes Absolute: 2.5 10*3/uL — ABNORMAL HIGH (ref 0.1–1.0)
Monocytes Relative: 20.9 % — ABNORMAL HIGH (ref 3.0–12.0)
Neutro Abs: 8.5 10*3/uL — ABNORMAL HIGH (ref 1.4–7.7)
Neutrophils Relative %: 70.1 % (ref 43.0–77.0)
Platelets: 200 10*3/uL (ref 150.0–400.0)
RBC: 4.55 Mil/uL (ref 4.22–5.81)
RDW: 15.7 % — ABNORMAL HIGH (ref 11.5–15.5)
WBC: 12 10*3/uL — ABNORMAL HIGH (ref 4.0–10.5)

## 2022-01-27 LAB — COMPREHENSIVE METABOLIC PANEL
ALT: 48 U/L (ref 0–53)
AST: 47 U/L — ABNORMAL HIGH (ref 0–37)
Albumin: 3.5 g/dL (ref 3.5–5.2)
Alkaline Phosphatase: 133 U/L — ABNORMAL HIGH (ref 39–117)
BUN: 30 mg/dL — ABNORMAL HIGH (ref 6–23)
CO2: 26 mEq/L (ref 19–32)
Calcium: 8.7 mg/dL (ref 8.4–10.5)
Chloride: 93 mEq/L — ABNORMAL LOW (ref 96–112)
Creatinine, Ser: 1.18 mg/dL (ref 0.40–1.50)
GFR: 60.99 mL/min (ref >60.00)
Glucose, Bld: 89 mg/dL (ref 70–99)
Potassium: 3.3 mEq/L — ABNORMAL LOW (ref 3.5–5.1)
Sodium: 129 mEq/L — ABNORMAL LOW (ref 135–145)
Total Bilirubin: 4.7 mg/dL — ABNORMAL HIGH (ref 0.2–1.2)
Total Protein: 6.8 g/dL (ref 6.0–8.3)

## 2022-01-27 LAB — URINALYSIS
Bilirubin Urine: NEGATIVE
Hgb urine dipstick: NEGATIVE
Ketones, ur: NEGATIVE
Leukocytes,Ua: NEGATIVE
Nitrite: NEGATIVE
Specific Gravity, Urine: 1.015 (ref 1.000–1.030)
Total Protein, Urine: NEGATIVE
Urine Glucose: NEGATIVE
Urobilinogen, UA: 4 — AB (ref 0.0–1.0)
pH: 6 (ref 5.0–8.0)

## 2022-01-27 LAB — LIPASE: Lipase: 94 U/L — ABNORMAL HIGH (ref 11.0–59.0)

## 2022-01-27 LAB — GAMMA GT: GGT: 69 U/L — ABNORMAL HIGH (ref 7–51)

## 2022-01-27 LAB — SEDIMENTATION RATE: Sed Rate: 61 mm/hr — ABNORMAL HIGH (ref 0–20)

## 2022-01-27 MED ORDER — AMOXICILLIN-POT CLAVULANATE 875-125 MG PO TABS: 1.0000 | ORAL_TABLET | Freq: Two times a day (BID) | ORAL | 0 refills | Status: DC

## 2022-01-27 NOTE — Progress Notes (Signed)
Subjective:  Patient ID: Russell Griffith, male    DOB: May 02, 1946  Age: 76 y.o. MRN: [id number]  CC: Follow-up (Following up from Urgent Care visit on Friday 2/14. Pt states symptoms started last Wednesday, after eating lunch he had chills, stomach ache, off balance and fatigue. On Thursday pt fell and hit his head. Pt has no appetite and is dehydrated. /(COVID Negative, Flu Negative & Hepatitis Negative))   HPI Russell Griffith presents with his wife Raquel Sarna who helps with history  Follow-up (Following up from Urgent Care visit on Friday 2/14. Pt states symptoms started last Wednesday, after eating lunch at [facility] - he had chills, stomach ache, off balance and fatigue. On Thursday pt fell and hit his head. Pt has no appetite and is dehydrated. /(COVID Negative, Flu Negative & Hepatitis Negative))  C/o weak legs, arms On Thur last wk - Ed fell at home - was not able to get up; EMS came  at 2 am - COVID (-); pt declined to be taken to ER. On Fri - same. [facility] to [facility] He looked jaundiced on Fri On Sat - he felt better re weakness in the proximal muscles     Outpatient Medications Prior to Visit  Medication Sig Dispense Refill   Cholecalciferol 25 MCG (1000 UT) capsule Take 1,000 Units by mouth daily with supper.      ELIQUIS 5 MG TABS tablet TAKE 1 TABLET BY MOUTH TWICE DAILY. 60 tablet 11   losartan (COZAAR) 25 MG tablet Take 25 mg by mouth daily.     NONFORMULARY OR COMPOUNDED ITEM Apply 1 application topically daily as needed (rash). Cetaphil + triamcinolone cream     pantoprazole (PROTONIX) 40 MG tablet TAKE 1 TABLET ONCE DAILY. 90 tablet 3   acetaminophen (TYLENOL) 500 MG tablet Take 1,000 mg by mouth every 6 (six) hours as needed for moderate pain or headache. (Patient not taking: Reported on 01/27/2022)     atorvastatin (LIPITOR) 20 MG tablet TAKE 1 TABLET ONCE DAILY AT 6 PM. (Patient not taking: Reported on 01/27/2022) 90 tablet 2   loratadine (CLARITIN) 10 MG tablet Take  1 tablet (10 mg total) by mouth daily. (Patient not taking: Reported on 12/22/2021) 30 tablet 11   polyvinyl alcohol (LIQUIFILM TEARS) 1.4 % ophthalmic solution Place 1 drop into both eyes every 8 (eight) hours as needed for dry eyes. (Patient not taking: Reported on 01/27/2022)     ramipril (ALTACE) 2.5 MG capsule Take 2.5 mg by mouth daily. (Patient not taking: Reported on 01/05/2022)     No facility-administered medications prior to visit.    ROS: Review of Systems  Constitutional:  Positive for chills and fatigue. Negative for appetite change and unexpected weight change.  HENT:  Negative for congestion, nosebleeds, sneezing, sore throat and trouble swallowing.   Eyes:  Negative for itching and visual disturbance.  Respiratory:  Negative for cough.   Cardiovascular:  Negative for chest pain, palpitations and leg swelling.  Gastrointestinal:  Negative for abdominal distention, abdominal pain, blood in stool, diarrhea, nausea and vomiting.  Genitourinary:  Negative for difficulty urinating, dysuria, flank pain, frequency and hematuria.  Musculoskeletal:  Negative for back pain, gait problem, joint swelling and neck pain.  Skin:  Positive for color change. Negative for rash.  Neurological:  Positive for weakness. Negative for dizziness, tremors and speech difficulty.  Psychiatric/Behavioral:  Negative for agitation, dysphoric mood and sleep disturbance. The patient is not nervous/anxious.    Objective:  BP 112/68 (  BP Location: Left Arm, Patient Position: Sitting, Cuff Size: Large)    Pulse 70    Temp 98.5 F (36.9 C) (Oral)    Ht 5\' 11"  (1.803 m)    Wt 196 lb 9.6 oz (89.2 kg)    SpO2 96%    BMI 27.42 kg/m   BP Readings from Last 3 Encounters:  01/27/22 112/68  12/22/21 122/70  10/31/21 128/72    Wt Readings from Last 3 Encounters:  01/27/22 196 lb 9.6 oz (89.2 kg)  12/22/21 195 lb 6.4 oz (88.6 kg)  11/05/21 192 lb (87.1 kg)    Physical Exam Constitutional:      General: He is not  in acute distress.    Appearance: He is well-developed. He is not toxic-appearing.     Comments: NAD  Eyes:     Conjunctiva/sclera: Conjunctivae normal.     Pupils: Pupils are equal, round, and reactive to light.  Neck:     Thyroid: No thyromegaly.     Vascular: No JVD.  Cardiovascular:     Rate and Rhythm: Normal rate and regular rhythm.     Heart sounds: Normal heart sounds. No murmur heard.   No friction rub. No gallop.  Pulmonary:     Effort: Pulmonary effort is normal. No respiratory distress.     Breath sounds: Normal breath sounds. No wheezing or rales.  Chest:     Chest wall: No tenderness.  Abdominal:     General: Bowel sounds are normal. There is no distension.     Palpations: Abdomen is soft. There is no mass.     Tenderness: There is no abdominal tenderness. There is no guarding or rebound.  Musculoskeletal:        General: No tenderness. Normal range of motion.     Cervical back: Normal range of motion.  Lymphadenopathy:     Cervical: No cervical adenopathy.  Skin:    General: Skin is warm and dry.     Coloration: Skin is jaundiced.     Findings: No rash.  Neurological:     Mental Status: He is alert and oriented to person, place, and time.     Cranial Nerves: No cranial nerve deficit.     Motor: No abnormal muscle tone.     Coordination: Coordination normal.     Gait: Gait abnormal.     Deep Tendon Reflexes: Reflexes are normal and symmetric.  Psychiatric:        Behavior: Behavior normal.        Thought Content: Thought content normal.        Judgment: Judgment normal.  No tremor Looks tired Walking slow No HSM DTRs, MS - ok     A total time of 45 minutes was spent preparing to see the patient, reviewing tests, x-rays, operative reports and other medical records.  Also, obtaining history and performing comprehensive physical exam.  Additionally, counseling the patient regarding the above listed issues.   Finally, documenting clinical information in the  health records, coordination of care w/Radiology, educating the patient. It is a complex case.  Lab Results  Component Value Date   WBC 7.0 11/25/2021   HGB 12.6 (L) 11/25/2021   HCT 37.9 (L) 11/25/2021   PLT 288.0 11/25/2021   GLUCOSE 102 (H) 11/25/2021   CHOL 107 09/29/2021   TRIG 56.0 09/29/2021   HDL 32.70 (L) 09/29/2021   LDLCALC 63 09/29/2021   ALT 29 11/25/2021   AST 28 11/25/2021   NA 139 11/25/2021  K 4.4 11/25/2021   CL 104 11/25/2021   CREATININE 1.08 11/25/2021   BUN 21 11/25/2021   CO2 28 11/25/2021   TSH 4.55 09/29/2021   PSA 0.81 09/29/2021   INR 1.53 (H) 07/15/2012   HGBA1C 5.8 07/30/2015    No results found.  Assessment & Plan:   Problem List Items Addressed This Visit     Jaundice    ?cholangitis vs other Empiric Augmentin Labs ordered MRCP if we can do it - he hasa pacemaker      Relevant Orders   CBC with Differential/Platelet   Sedimentation rate   Urinalysis   Comprehensive metabolic panel   Lipase   ANCA Profile   Gamma GT   Mitochondrial Antibodies   MR ABDOMEN WITH MRCP W CONTRAST      Meds ordered this encounter  Medications   amoxicillin-clavulanate (AUGMENTIN) 875-125 MG tablet    Sig: Take 1 tablet by mouth 2 (two) times daily.    Dispense:  20 tablet    Refill:  0      Follow-up: Return in about 1 week (around 02/03/2022) for a follow-up visit.  Walker Kehr, MD

## 2022-01-27 NOTE — Assessment & Plan Note (Addendum)
?  cholangitis vs other Empiric Augmentin Labs ordered MRCP if we can do it - he hasa pacemaker

## 2022-01-28 ENCOUNTER — Telehealth: Payer: Self-pay

## 2022-01-28 ENCOUNTER — Telehealth: Payer: Self-pay | Admitting: Internal Medicine

## 2022-01-28 ENCOUNTER — Other Ambulatory Visit: Payer: Self-pay

## 2022-01-28 ENCOUNTER — Emergency Department (HOSPITAL_COMMUNITY): Payer: PPO

## 2022-01-28 ENCOUNTER — Encounter (HOSPITAL_COMMUNITY): Payer: Self-pay | Admitting: Emergency Medicine

## 2022-01-28 ENCOUNTER — Ambulatory Visit: Payer: PPO | Admitting: Internal Medicine

## 2022-01-28 ENCOUNTER — Emergency Department (HOSPITAL_COMMUNITY)
Admission: EM | Admit: 2022-01-28 | Discharge: 2022-01-28 | Disposition: A | Discharge status: Home / Self Care | Payer: PPO | Attending: Emergency Medicine | Admitting: Emergency Medicine

## 2022-01-28 DIAGNOSIS — Z951 Presence of aortocoronary bypass graft: Secondary | ICD-10-CM | POA: Diagnosis not present

## 2022-01-28 DIAGNOSIS — I10 Essential (primary) hypertension: Secondary | ICD-10-CM | POA: Insufficient documentation

## 2022-01-28 DIAGNOSIS — H15849 Scleral ectasia, unspecified eye: Secondary | ICD-10-CM | POA: Insufficient documentation

## 2022-01-28 DIAGNOSIS — R7989 Other specified abnormal findings of blood chemistry: Secondary | ICD-10-CM | POA: Insufficient documentation

## 2022-01-28 DIAGNOSIS — Z7901 Long term (current) use of anticoagulants: Secondary | ICD-10-CM | POA: Diagnosis not present

## 2022-01-28 DIAGNOSIS — R748 Abnormal levels of other serum enzymes: Secondary | ICD-10-CM | POA: Diagnosis not present

## 2022-01-28 DIAGNOSIS — R011 Cardiac murmur, unspecified: Secondary | ICD-10-CM | POA: Insufficient documentation

## 2022-01-28 DIAGNOSIS — R7401 Elevation of levels of liver transaminase levels: Secondary | ICD-10-CM | POA: Diagnosis not present

## 2022-01-28 DIAGNOSIS — R109 Unspecified abdominal pain: Secondary | ICD-10-CM | POA: Diagnosis present

## 2022-01-28 DIAGNOSIS — K760 Fatty (change of) liver, not elsewhere classified: Secondary | ICD-10-CM | POA: Diagnosis not present

## 2022-01-28 DIAGNOSIS — I2510 Atherosclerotic heart disease of native coronary artery without angina pectoris: Secondary | ICD-10-CM | POA: Insufficient documentation

## 2022-01-28 DIAGNOSIS — N200 Calculus of kidney: Secondary | ICD-10-CM | POA: Diagnosis not present

## 2022-01-28 DIAGNOSIS — R17 Unspecified jaundice: Secondary | ICD-10-CM | POA: Diagnosis not present

## 2022-01-28 DIAGNOSIS — Z79899 Other long term (current) drug therapy: Secondary | ICD-10-CM | POA: Diagnosis not present

## 2022-01-28 LAB — COMPREHENSIVE METABOLIC PANEL
ALT: 48 U/L — ABNORMAL HIGH (ref 0–44)
AST: 45 U/L — ABNORMAL HIGH (ref 15–41)
Albumin: 2.9 g/dL — ABNORMAL LOW (ref 3.5–5.0)
Alkaline Phosphatase: 133 U/L — ABNORMAL HIGH (ref 38–126)
Anion gap: 8 (ref 5–15)
BUN: 29 mg/dL — ABNORMAL HIGH (ref 8–23)
CO2: 25 mmol/L (ref 22–32)
Calcium: 8 mg/dL — ABNORMAL LOW (ref 8.9–10.3)
Chloride: 97 mmol/L — ABNORMAL LOW (ref 98–111)
Creatinine, Ser: 1.06 mg/dL (ref 0.61–1.24)
GFR, Estimated: >60 mL/min (ref >60)
Glucose, Bld: 109 mg/dL — ABNORMAL HIGH (ref 70–99)
Potassium: 3.4 mmol/L — ABNORMAL LOW (ref 3.5–5.1)
Sodium: 130 mmol/L — ABNORMAL LOW (ref 135–145)
Total Bilirubin: 3.3 mg/dL — ABNORMAL HIGH (ref 0.3–1.2)
Total Protein: 6.1 g/dL — ABNORMAL LOW (ref 6.5–8.1)

## 2022-01-28 LAB — CBC
HCT: 34.2 % — ABNORMAL LOW (ref 39.0–52.0)
Hemoglobin: 11.1 g/dL — ABNORMAL LOW (ref 13.0–17.0)
MCH: 26.5 pg (ref 26.0–34.0)
MCHC: 32.5 g/dL (ref 30.0–36.0)
MCV: 81.6 fL (ref 80.0–100.0)
Platelets: 247 10*3/uL (ref 150–400)
RBC: 4.19 MIL/uL — ABNORMAL LOW (ref 4.22–5.81)
RDW: 15.1 % (ref 11.5–15.5)
WBC: 10.6 10*3/uL — ABNORMAL HIGH (ref 4.0–10.5)
nRBC: 0 % (ref 0.0–0.2)

## 2022-01-28 LAB — LIPASE, BLOOD: Lipase: 81 U/L — ABNORMAL HIGH (ref 11–51)

## 2022-01-28 LAB — URINALYSIS, ROUTINE W REFLEX MICROSCOPIC
Bilirubin Urine: NEGATIVE
Glucose, UA: NEGATIVE mg/dL
Hgb urine dipstick: NEGATIVE
Ketones, ur: NEGATIVE mg/dL
Leukocytes,Ua: NEGATIVE
Nitrite: NEGATIVE
Protein, ur: NEGATIVE mg/dL
Specific Gravity, Urine: 1.015 (ref 1.005–1.030)
pH: 5 (ref 5.0–8.0)

## 2022-01-28 LAB — MITOCHONDRIAL ANTIBODIES: Mitochondrial M2 Ab, IgG: <=20 U (ref <=20.0)

## 2022-01-28 MED ORDER — IOHEXOL 300 MG/ML  SOLN
100.0000 mL | Freq: Once | INTRAMUSCULAR | Status: AC | PRN
Start: 2022-01-28 — End: 2022-01-28
  Administered 2022-01-28: 100 mL via INTRAVENOUS

## 2022-01-28 NOTE — ED Triage Notes (Signed)
Pt reports per Dr. Ardis Hughs he should be evaluated in the ED for a potentially blocked gallduct/ CT abdomen. Pt reports 1 week ago after lunch he got chills, loss of balance and generalized weakness/fatigue, continued throughout the day. Fell at home Thursday. Friday went to UC. Saw PCP yesterday, wife reports jaundice. Has had 2 doses of Augmentin.  ?

## 2022-01-28 NOTE — Telephone Encounter (Signed)
[facility] called - ASAP order for Abdomen  can not be completed.  They are unable to do this scan because of his pace maker. ?Patient has been made aware of this. ?

## 2022-01-28 NOTE — ED Provider Triage Note (Signed)
Emergency Medicine Provider Triage Evaluation Note ? ?Russell Griffith , a 76 y.o. male  was evaluated in triage.  Pt complains of jaundice.  He has been communication with his primary care doctor who reportedly talked with GI and recommended he come here.  They had wanted to get an MRCP however because of his pacemaker they were unable to. ?Notes available in the chart show Dr. Ardis Hughs with GI had filled a CT scan would be sufficient for evaluation in place of a MRCP. ? ?Physical Exam  ?BP 112/66 (BP Location: Right Arm)   Pulse (!) 55   Temp (!) 97.5 ?F (36.4 ?C) (Oral)   Resp 16   SpO2 98%  ?Gen:   Awake, no distress   ?Resp:  Normal effort  ?MSK:   Moves extremities without difficulty  ?Other:  Mild epigastric tenderness to palpation.  Mild jaundice noted in bilateral sclera. ? ?Medical Decision Making  ?Medically screening exam initiated at 3:12 PM.  Appropriate orders placed.  [name] was informed that the remainder of the evaluation will be completed by another provider, this initial triage assessment does not replace that evaluation, and the importance of remaining in the ED until their evaluation is complete. ? ?Based on notes by Dr. Ardis Hughs recommending a CT scan CT orders placed.  Patient confirms he does not have a contrast allergy. ?  ?Lorin Glass, PA-C ?01/28/22 1524 ? ?

## 2022-01-28 NOTE — Telephone Encounter (Signed)
I spoke to the pt and his wife and they agree to head to [facility] ED now.  ?

## 2022-01-28 NOTE — ED Provider Notes (Signed)
[facility] DEPT Provider Note   CSN: [id number] Arrival date & time: 01/28/22  1343     History  Chief Complaint  Patient presents with   Abdominal Pain    Jeremih Dearmas is a 76 y.o. male.  Patient is a 76 year old male with a history of chronic atrial fibrillation on Eliquis, CAD status post CABG, aortic stenosis, hypertension and Gilbert's disease who is presenting today due to abnormal labs and further evaluation.  Patient reports that last Wednesday is when his symptoms started.  He had noted for several weeks prior to that he would get intermittent abdominal discomfort that he described as an ache but would always go away however on Wednesday patient reports that he started to get generalized chills, rigors, abdominal pain, nausea, generalized weakness and loss of appetite.  The symptoms were persistent for several days and he even fell on Thursday night because he was so weak.  He reports on Friday he was starting to feel little better and he went to urgent care at which time they did labs and found significant abnormality with his bilirubin as well as elevation of some of his liver tests.  He reported at that time he was starting to feel better.  Yesterday he got into see his doctor and reports he is starting to eat again, stools have gone back to being solid as he did have some diarrhea prior when all the other was going on.  And his doctor did more blood work and started him on Augmentin yesterday.  His blood work returned and showed that he had a total bilirubin of 4.7, white blood cell count of 12 and mild elevation of his LFTs.  They spoke to Dr. Ardis Hughs his GI doctor today who recommended he come to the emergency room to ensure that he did not have any signs of obstruction.  He reports he does not have any abdominal pain at this time, and the nausea has resolved but he is eating a very bland diet.  The history is provided by the patient and medical  records.  Abdominal Pain     Home Medications Prior to Admission medications   Medication Sig Start Date End Date Taking? Authorizing Provider  acetaminophen (TYLENOL) 500 MG tablet Take 1,000 mg by mouth every 6 (six) hours as needed for moderate pain or headache. Patient not taking: Reported on 01/27/2022    [provider]  amoxicillin-clavulanate (AUGMENTIN) 875-125 MG tablet Take 1 tablet by mouth 2 (two) times daily. 01/27/22   [name], Evie Lacks, MD  atorvastatin (LIPITOR) 20 MG tablet TAKE 1 TABLET ONCE DAILY AT 6 PM. Patient not taking: Reported on 01/27/2022 03/19/21   [name], MD  Cholecalciferol 25 MCG (1000 UT) capsule Take 1,000 Units by mouth daily with supper.     [provider]  ELIQUIS 5 MG TABS tablet TAKE 1 TABLET BY MOUTH TWICE DAILY. 08/25/21   Deboraha Sprang, MD  loratadine (CLARITIN) 10 MG tablet Take 1 tablet (10 mg total) by mouth daily. Patient not taking: Reported on 12/22/2021 06/15/21   [name], Evie Lacks, MD  losartan (COZAAR) 25 MG tablet Take 25 mg by mouth daily.    [provider]  NONFORMULARY OR COMPOUNDED ITEM Apply 1 application topically daily as needed (rash). Cetaphil + triamcinolone cream    [provider]  pantoprazole (PROTONIX) 40 MG tablet TAKE 1 TABLET ONCE DAILY. 06/18/21   Milus Banister, MD  polyvinyl alcohol (LIQUIFILM TEARS) 1.4 %  ophthalmic solution Place 1 drop into both eyes every 8 (eight) hours as needed for dry eyes. Patient not taking: Reported on 01/27/2022    [provider]      Allergies    Spironolactone, Cayenne, Niacin and related, Sulfa antibiotics, and Sulfonamide derivatives    Review of Systems   Review of Systems  Gastrointestinal:  Positive for abdominal pain.   Physical Exam Updated Vital Signs BP 118/65    Pulse 64    Temp (!) 97.5 F (36.4 C) (Oral)    Resp 18    SpO2 100%  Physical Exam Vitals and nursing note reviewed.  Constitutional:      General: He  is not in acute distress.    Appearance: He is well-developed.  HENT:     Head: Normocephalic and atraumatic.  Eyes:     General: Scleral icterus present.     Conjunctiva/sclera: Conjunctivae normal.     Pupils: Pupils are equal, round, and reactive to light.  Cardiovascular:     Rate and Rhythm: Normal rate. Rhythm irregularly irregular.     Heart sounds: Murmur heard.  Pulmonary:     Effort: Pulmonary effort is normal. No respiratory distress.     Breath sounds: Normal breath sounds. No wheezing or rales.  Abdominal:     General: There is no distension.     Palpations: Abdomen is soft.     Tenderness: There is no abdominal tenderness. There is no guarding or rebound.  Musculoskeletal:        General: No tenderness. Normal range of motion.     Cervical back: Normal range of motion and neck supple.     Right lower leg: No edema.     Left lower leg: No edema.  Skin:    General: Skin is warm and dry.     Findings: No erythema or rash.  Neurological:     Mental Status: He is alert and oriented to person, place, and time. Mental status is at baseline.  Psychiatric:        Mood and Affect: Mood normal.        Behavior: Behavior normal.    ED Results / Procedures / Treatments   Labs (all labs ordered are listed, but only abnormal results are displayed) Labs Reviewed  LIPASE, BLOOD - Abnormal; Notable for the following components:      Result Value   Lipase 81 (*)    All other components within normal limits  COMPREHENSIVE METABOLIC PANEL - Abnormal; Notable for the following components:   Sodium 130 (*)    Potassium 3.4 (*)    Chloride 97 (*)    Glucose, Bld 109 (*)    BUN 29 (*)    Calcium 8.0 (*)    Total Protein 6.1 (*)    Albumin 2.9 (*)    AST 45 (*)    ALT 48 (*)    Alkaline Phosphatase 133 (*)    Total Bilirubin 3.3 (*)    All other components within normal limits  CBC - Abnormal; Notable for the following components:   WBC 10.6 (*)    RBC 4.19 (*)     Hemoglobin 11.1 (*)    HCT 34.2 (*)    All other components within normal limits  URINALYSIS, ROUTINE W REFLEX MICROSCOPIC    EKG None  Radiology CT ABDOMEN PELVIS W CONTRAST  Result Date: 01/28/2022 CLINICAL DATA:  Nausea and vomiting, generalized weakness for 1 week, jaundice EXAM: CT ABDOMEN AND PELVIS  WITH CONTRAST TECHNIQUE: Multidetector CT imaging of the abdomen and pelvis was performed using the standard protocol following bolus administration of intravenous contrast. RADIATION DOSE REDUCTION: This exam was performed according to the departmental dose-optimization program which includes automated exposure control, adjustment of the mA and/or kV according to patient size and/or use of iterative reconstruction technique. CONTRAST:  187mL OMNIPAQUE IOHEXOL 300 MG/ML  SOLN COMPARISON:  08/17/2020 FINDINGS: Lower chest: Trace bilateral pleural effusions. No acute airspace disease. Minimal bibasilar atelectasis. Pacemaker lead within the right ventricle. Hepatobiliary: Diffuse hepatic steatosis. No focal liver abnormality or intrahepatic biliary duct dilation. The gallbladder is decompressed, with no evidence of cholelithiasis or cholecystitis. No evidence of common bile duct dilation or choledocholithiasis. Pancreas: Unremarkable. No pancreatic ductal dilatation or surrounding inflammatory changes. Spleen: Stable borderline splenomegaly, measuring up to 13 cm in length. No focal parenchymal abnormalities. Adrenals/Urinary Tract: Punctate 3 mm nonobstructing left renal calculus unchanged. No obstructive uropathy within either kidney. Normal bilateral renal parenchymal enhancement. Adrenals and bladder are grossly unremarkable. Stomach/Bowel: No bowel obstruction or ileus. Normal appendix right lower quadrant. No bowel wall thickening or inflammatory change. Vascular/Lymphatic: Mild atherosclerosis of the aorta again noted. Borderline enlarged adenopathy within the porta hepatis and upper abdominal  retroperitoneum, with index lymph node in the porta hepatis measuring 16 mm in short axis reference image 28/2. Previously this had measured approximately 14 mm. No new pathologic adenopathy. Reproductive: Prostate is unremarkable. Other: No free fluid or free gas.  No abdominal wall hernia. Musculoskeletal: No acute or destructive bony lesions. Reconstructed images demonstrate no additional findings. IMPRESSION: 1. No evidence of cholelithiasis, cholecystitis, or choledocholithiasis. No biliary duct dilation. 2. Hepatic steatosis. 3. Stable borderline splenomegaly. 4. Trace bilateral pleural effusions. 5. Stable 3 mm nonobstructing left renal calculus. 6. Stable upper abdominal retroperitoneal and porta hepatis lymph nodes, likely reactive. 7.  Aortic Atherosclerosis (ICD10-I70.0). Electronically Signed   By: Randa Ngo M.D.   On: 01/28/2022 16:24    Procedures Procedures    Medications Ordered in ED Medications  iohexol (OMNIPAQUE) 300 MG/ML solution 100 mL (100 mLs Intravenous Contrast Given 01/28/22 1606)    ED Course/ Medical Decision Making/ A&P                           Medical Decision Making Amount and/or Complexity of Data Reviewed External Data Reviewed: labs and notes. Labs: ordered. Decision-making details documented in ED Course. Radiology: ordered and independent interpretation performed. Decision-making details documented in ED Course.   Patient is a 76 year old male presenting today with improving abdominal discomfort, nausea, weakness and lack of appetite.  Patient several days ago was found to be jaundiced with elevated LFTs and elevated lipase.  He saw his doctor yesterday and did report his symptoms are starting to improve.  He was placed on Augmentin which he has taken 2 doses of but came here today because they were concern for possible obstruction.  Patient has no abdominal pain on my exam and does still have mild scleral icterus but he reports he has a history of  Gilbert's disease and his bilirubin is usually between 1.8-3.  All of his external medical records were reviewed.  I independently interpreted patient's labs today and his CBC shows improving white cell count now at 10 with stable hemoglobin and platelets, CMP with persistent mild transaminitis with an AST of 45 and ALT of 48 and improving total bilirubin now of 3.3 from 4.7 yesterday.  Lipase is also improving  at 15 today from 10 yesterday.  UA within normal limits.  I independently viewed and interpreted patient's CT of his abdomen pelvis which shows no evidence of obstruction or hydronephrosis.  Radiologist reports no evidence of cholelithiasis or cystitis or choledocholithiasis.  Patient's biliary duct is not dilated and he has stable upper abdominal retroperitoneal and porta hepatis lymph nodes which are reported to be likely reactive.  6:34 PM Spoke with Dr. Candis Schatz with [facility] and discussed lab findings and CT.  At this time feel that patient is appropriate for discharge home, continue the Augmentin and follow-up with Dr. Ardis Hughs as an outpatient.  Patient does not meet any criteria for admission at this time.  Findings discussed with the patient and his wife he was discharged home in good condition.        Final Clinical Impression(s) / ED Diagnoses Final diagnoses:  Transaminitis  Hyperbilirubinemia    Rx / DC Orders ED Discharge Orders     None         Blanchie Dessert, MD 01/28/22 1835

## 2022-01-28 NOTE — Discharge Instructions (Signed)
The blood work is improving today.  The CAT scan did not show any sign of blockage.  Continue taking your antibiotic at this time.  Also continue with a bland diet ?

## 2022-01-28 NOTE — Telephone Encounter (Signed)
-----   Message from Milus Banister, MD sent at 01/28/2022 12:43 PM EST ----- ?Regarding: FW: FYI ?Sudais Banghart, ?I am not sure that his PCP has checked this.  Can you please contact the patient and let him know that Dr. Alain Marion reached out and I think it is absolutely safest that he proceed to the emergency room for evaluation.  I have concerns that he might have something wrong with his bile duct given his pains, chills, elevated white count and rising liver tests. ? ? ?----- Message ----- ?From: Milus Banister, MD ?Sent: 01/28/2022   8:52 AM EST ?To: Cassandria Anger, MD ?Subject: RE: Juluis Rainier                                       ? ?Hey, ?I reviewed your note.  He had some chills and abdominal discomforts that started prior to the jaundice.  I think he needs to go to the emergency room for expedited evaluation.  CT scan is probably sufficient but they will be able to figure all that out. ? ?Thanks ?DJ ? ?----- Message ----- ?From: Cassandria Anger, MD ?Sent: 01/28/2022   8:47 AM EST ?To: Milus Banister, MD ?Subject: Juluis Rainier                                           ? ?Hi Dan, ?Ed has developed acute jaundice. I'm getting MRCP if his pacemaker allows for it. He will see you for a f/u. ?Hope you're well! ?AP ? ? ? ?

## 2022-01-28 NOTE — Telephone Encounter (Signed)
Left message on machine to call back  

## 2022-01-29 ENCOUNTER — Telehealth: Payer: Self-pay

## 2022-01-29 DIAGNOSIS — R17 Unspecified jaundice: Secondary | ICD-10-CM

## 2022-01-29 LAB — ANCA PROFILE
Anti-MPO Antibodies: <0.2 units (ref 0.0–0.9)
Anti-PR3 Antibodies: <0.2 units (ref 0.0–0.9)
Atypical pANCA: <1:20 {titer}
C-ANCA: <1:20 {titer}
P-ANCA: <1:20 {titer}

## 2022-01-29 NOTE — Telephone Encounter (Signed)
Encounter started in error.

## 2022-01-29 NOTE — Telephone Encounter (Signed)
Ed went to ER on 01/28/22 and had a CT ?Thx ?

## 2022-01-29 NOTE — Telephone Encounter (Signed)
Appt made to see DJ on 3/3 at 850 am ?Lab order entered for stat CBC  ? ?The patient has been notified of this information and all questions answered.  ?

## 2022-01-29 NOTE — Telephone Encounter (Signed)
-----   Message -----  ?From: [name], MD  ?Sent: 01/29/2022   7:48 AM EST  ?To: Milus Banister, MD, Timothy Lasso, RN  ? ?DJ,  ?Just wanted to let you know that the ED called me about Mr. Mateo.  Sounds he was sent there by PCP because of elevated tbili in the setting of recent abdominal pain/nausea/malaise.  ED reports that his symptoms are essentially resolved, nontender on exam and basically was there because of labs.  Tbili was 3 (down from 4), but rest of LAEs essentially normal (AST/ALT 40s, ALP 130s).    ?CT in the ED without any biliary dilation/GBW thickening, no obvious stones.  No US performed.  ?Retroperitoneal lymph nodes, thought to be reactive.  ? ?I didn't think there was any reason to keep him the hospital and thought the hyperbilirubinemia was more likely to be indirect based on his other liver enzymes.  Probably warrants a fractionated bili and maybe RUQUS to look for stones, but I defer to you.  I told the ED I would discuss with you and that we would contact him with any further follow up needed.  ? ?Regards,  ?[place]   ?

## 2022-01-29 NOTE — Telephone Encounter (Signed)
Russell Banister, MD  Timothy Lasso, RN ?He needs OV with any provider, first available extender or myself.  Needs cmet that morning.    ? ?Thanks  ?

## 2022-01-30 ENCOUNTER — Other Ambulatory Visit (INDEPENDENT_AMBULATORY_CARE_PROVIDER_SITE_OTHER): Payer: PPO

## 2022-01-30 ENCOUNTER — Encounter: Payer: Self-pay | Admitting: Cardiology

## 2022-01-30 ENCOUNTER — Ambulatory Visit: Payer: PPO | Admitting: Gastroenterology

## 2022-01-30 ENCOUNTER — Encounter: Payer: Self-pay | Admitting: Gastroenterology

## 2022-01-30 VITALS — BP 102/50 | HR 78 | Ht 71.0 in | Wt 194.2 lb

## 2022-01-30 DIAGNOSIS — R7989 Other specified abnormal findings of blood chemistry: Secondary | ICD-10-CM

## 2022-01-30 DIAGNOSIS — R17 Unspecified jaundice: Secondary | ICD-10-CM | POA: Diagnosis not present

## 2022-01-30 LAB — CBC WITH DIFFERENTIAL/PLATELET
Basophils Absolute: 0.1 10*3/uL (ref 0.0–0.1)
Basophils Relative: 0.7 % (ref 0.0–3.0)
Eosinophils Absolute: 0.1 10*3/uL (ref 0.0–0.7)
Eosinophils Relative: 1.4 % (ref 0.0–5.0)
HCT: 35.5 % — ABNORMAL LOW (ref 39.0–52.0)
Hemoglobin: 11.7 g/dL — ABNORMAL LOW (ref 13.0–17.0)
Lymphocytes Relative: 18.8 % (ref 12.0–46.0)
Lymphs Abs: 1.5 10*3/uL (ref 0.7–4.0)
MCHC: 33 g/dL (ref 30.0–36.0)
MCV: 79.6 fl (ref 78.0–100.0)
Monocytes Absolute: 1.2 10*3/uL — ABNORMAL HIGH (ref 0.1–1.0)
Monocytes Relative: 14.6 % — ABNORMAL HIGH (ref 3.0–12.0)
Neutro Abs: 5.2 10*3/uL (ref 1.4–7.7)
Neutrophils Relative %: 64.5 % (ref 43.0–77.0)
Platelets: 283 10*3/uL (ref 150.0–400.0)
RBC: 4.46 Mil/uL (ref 4.22–5.81)
RDW: 15.8 % — ABNORMAL HIGH (ref 11.5–15.5)
WBC: 8.1 10*3/uL (ref 4.0–10.5)

## 2022-01-30 MED ORDER — FAMOTIDINE 20 MG PO TABS: 20.0000 mg | ORAL_TABLET | Freq: Every day | ORAL | Status: DC

## 2022-01-30 NOTE — Progress Notes (Signed)
Review of pertinent gastrointestinal problems: 1. History of Barrett's esophagus. No dysplasia seen on serial endoscopies 2004, 2005, 2007. Most recent EGD 2009 found short segment of Barrett's appearing mucosa, biopsies did not confirm intestinal metaplasia however.   EGD may 2011 also found short segment, non-nodular Barrett's mucosa however biopsies again showed no intestinal metaplasia.  EGD Dr. Ardis Hughs 10/2012 slightly irregular z line, non-nodular, biopsies showed no IM, no further surveillance was recommended.  EGD July 2021 for dyspepsia showed slightly irregular Z-line that was unchanged from previous examinations.  It was not sampled.  There was mild to moderate nonspecific gastritis.  Pathology negative for H. pylori 2. Personal history of precancerous colon polyps. Adenomatous polyp removed 2003 by Dr. Velora Heckler. Followup colonoscopy 2005 found no polyps. Dr. Inocente Salles recommended that he have a repeat colonoscopy at 7 year interval.  Colonoscopy may 2011 found left-sided diverticulosis, small colon polyp that was an adenoma. Colonoscopy 05/2015 Dr. Ardis Hughs found single subCM adenoma, internal hems, diverticulosis.  Colonoscopy July 2021 single subcentimeter polyp was removed.  Diverticulosis and hemorrhoids were noted.  Pathology showed tubular adenoma, recommended repeat examination at 7 years 3. Internal hems. Banded by Dr. Carlean Purl [phone]. 4. ??Cirrhosis: CT scan September 2021 done for hematuria and abdominal pains suggested possible cirrhosis and "multiple prominent borderline enlarged and mildly enlarged retroperitoneal and upper abdominal lymph nodes most evident in the region of the porta hepatis".  Repeat CT scan September 2021 suggested that the lymph nodes were unchanged since October 2014 and thus "presumably reactive in etiology" per the radiologist.  No personal history of liver disease.  November 2021 ultrasound with elastography "ruled out chronic liver disease". 5.  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   Multiple liver tests over many years showing elevated total bilirubin with significant indirect predominance.  HPI: This is a very pleasant 76 year old man who is here with his wife today.   I last saw him here in the office November 2021.  There was some questions about whether he indeed had cirrhosis or not and I in the end decided that he probably did not based on repeat imaging, elastography, blood work, clinical history.  Ed has had mild abdominal discomforts for months, sometimes he seems to improve with walking.  10 days ago he had a luncheon at work, buffet.  About an hour afterwards he began having shaking chills extreme fatigue weakness, mild nausea with some vomiting a small amount and some bowel changes with some diarrhea.  His appetite was significantly decreased he became so weak that night he could barely move out of bed.  He went to see his primary care provider who ordered usual labs and this showed his white count was slightly elevated, his LFTs were significantly elevated.  He put him on empiric Augmentin twice daily, ordered an MRI which cannot be done because of his pacemaker and when I heard about this I became concerned about possible underlying cholangitis that I directed him to the emergency room for more expedited evaluation.  He did end up going to the emergency room yesterday, spent 6 hours there.  See CT scan below.  Lab tests were about what they were a few days before.  He has gradually improved, the weakness is not gone but deftly much better, the nausea is improved.  He is eating a bland diet, he is paying attention to staying hydrated.  He has also had a chronic dry cough it is definitely worse when he is laying down for months.  CT scan abdomen pelvis  March 2023: Liver looked normal, no biliary dilation.  No gallstones in his gallbladder.   ROS: complete GI ROS as described in HPI, all other review negative.  Constitutional:  No unintentional weight  loss   Past Medical History:  Diagnosis Date   Anxiety    Aortic stenosis    Mild, echo, April, 2014   BPH (benign prostatic hyperplasia)    CAD (coronary artery disease)    a. 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 [facility]   Precancerous lesion    Forehead   Primary osteoarthritis of left knee    Mild   Prolapsed internal hemorrhoids, grade 3 08/12/2015   PVC's (premature ventricular contractions)    Tubular adenoma of colon     Past Surgical History:  Procedure Laterality Date   ABLATION     PVI at [place] N/A 01/25/2019   Procedure: [place];  Surgeon: [name], MD;  Location: [place] [facility];  Service: Cardiovascular;  Laterality: N/A;   CARDIOVERSION N/A 10/14/2018   Procedure: CARDIOVERSION;  Surgeon: Fay Records, MD;  Location: [place];  Service: Cardiovascular;  Laterality: N/A;   CARDIOVERSION N/A 08/18/2019   Procedure: CARDIOVERSION;  Surgeon: Jerline Pain, MD;  Location: [place];  Service: Cardiovascular;  Laterality: N/A;   COLONOSCOPY     CORONARY ARTERY BYPASS GRAFT  2000   CABG X5   CYSTOSCOPY WITH RETROGRADE PYELOGRAM, URETEROSCOPY AND STENT PLACEMENT Bilateral 09/04/2020   Procedure: CYSTOSCOPY WITH BILATERAL RETROGRADE PYELOGRAM, RIGHT URETEROSCOPY AND RIGHT  STENT PLACEMENT;  Surgeon: Franchot Gallo, MD;  [facility];  Service: Urology;  Laterality: Bilateral;   ESOPHAGOGASTRODUODENOSCOPY     HEAD & NECK SKIN LESION EXCISIONAL BIOPSY     HEMORRHOID BANDING     INTRAVASCULAR PRESSURE WIRE/FFR STUDY N/A 01/05/2018    Procedure: INTRAVASCULAR PRESSURE WIRE/FFR STUDY;  Surgeon: Sherren Mocha, MD;  Location: [place] [facility];  Service: Cardiovascular;  Laterality: N/A;   LEFT HEART CATH AND CORS/GRAFTS ANGIOGRAPHY N/A 01/05/2018   Procedure: LEFT HEART CATH AND CORS/GRAFTS ANGIOGRAPHY;  Surgeon: Sherren Mocha, MD;  Location: [place] [facility];  Service: Cardiovascular;  Laterality: N/A;   PERMANENT PACEMAKER INSERTION N/A 07/15/2012   Procedure: PERMANENT PACEMAKER INSERTION;  Surgeon: Deboraha Sprang, MD;  Location: [facility] CATH LAB;  Service: Cardiovascular;  Laterality: N/A;   rotator cuff surgery Right 2017   TONSILLECTOMY  1956   WISDOM TOOTH EXTRACTION      Current Outpatient Medications  Medication Instructions   amoxicillin-clavulanate (AUGMENTIN) 875-125 MG tablet 1 tablet, Oral, 2 times daily   Cholecalciferol 1,000 Units, Oral, Daily with supper   ELIQUIS 5 MG TABS tablet TAKE 1 TABLET BY MOUTH TWICE DAILY.   losartan (COZAAR) 25 mg, Oral, Daily   NONFORMULARY OR COMPOUNDED ITEM 1 application, Topical, Daily PRN, Cetaphil + triamcinolone cream    pantoprazole (PROTONIX) 40 MG tablet TAKE 1 TABLET ONCE DAILY.    Allergies as of 01/30/2022 - Review Complete 01/30/2022  Allergen Reaction Noted   Spironolactone  09/29/2021   Cayenne Other (See Comments) 10/29/2015   Niacin and related Other (See Comments) 08/06/2016   Sulfa antibiotics Itching and Rash 07/27/2021   Sulfonamide derivatives Other (See Comments)     Family History  Problem Relation Age of Onset   Coronary artery disease Other 79   Diabetes Other    Multiple myeloma Mother    Diabetes Father    Renal Disease Father    Hyperlipidemia Other    Hypertension Other    Colon cancer Neg Hx    Esophageal cancer Neg Hx    Stomach cancer Neg Hx    Rectal cancer Neg Hx     Social History   Socioeconomic History   Marital status: Married    Spouse name: Not on file   Number of children: 2   Years of education: 18   Highest  education level: Not on file  Occupational History   Occupation: Technical brewer: [employer]    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  2 daughters-'75, '79; 1 grandchild; step-daughter and step grandson.  Regular exercise-yes, runs 1.5-2 mi 4x/wk, also eliptical      Patient signed a Designated Party Release to allow his wife, Odell Choung, to have access to his medical records/ information.   Social Determinants of Health   Financial Resource Strain: Low Risk    Difficulty of Paying Living Expenses: Not hard at all  Food Insecurity: No Food Insecurity   Worried About Charity fundraiser in the Last Year: Never true   Badger in the Last Year: Never true  Transportation Needs: No Transportation Needs   Lack of Transportation (Medical): No   Lack of Transportation (Non-Medical): No  Physical Activity: Sufficiently Active   Days of Exercise per Week: 7 days   Minutes of Exercise per Session: 60 min  Stress: No Stress Concern Present   Feeling of Stress : Not at all  Social Connections: Socially Integrated   Frequency of Communication with Friends and Family: More than three times a week   Frequency of Social Gatherings with Friends and Family: More than three times a week   Attends Religious Services: More than 4 times per year   Active Member of [employer] or Organizations: Yes   Attends Music therapist: More than 4 times per year   Marital Status: Married  Human resources officer Violence: Not At Risk    Fear of Current or Ex-Partner: No   Emotionally Abused: No   Physically Abused: No   Sexually Abused: No     Physical Exam: BP (!) 102/50    Pulse 78    Ht _0  (1.803 m)    Wt 194 lb 4 oz (88.1 kg)    BMI 27.09 kg/m  Constitutional: generally well-appearing Psychiatric: alert and oriented x3 Abdomen: soft, nontender, nondistended, no obvious ascites, no peritoneal signs, normal bowel sounds No peripheral edema noted in  lower extremities  Assessment and plan: 76 y.o. male with recent acute illness, chronic dry cough  I was asked first concerned about bacterial cholangitis and so is his primary care physician.  I think one of the factors that drove this concern was his underlying Gilbert's which caused his liver test to be quite elevated as it usually does.  Expedited evaluation with CT scan prove that he had no biliary dilation, no stones in his gallbladder.  I think a more likely explanation was that he caught some sort of infectious illness last week, possibly at that but failed lunch at work.  This gave him a viral syndrome of chills, some myalgias, some GI symptoms.  This also tripped his Gilbert's.  I recommended that he stop taking the Augmentin after 10 total pills which will be in about a day and a half.  I recommended that he focus on staying hydrated and improve his diet.  He is gradually improving already.  He is already planning on meeting with his primary care provider mid next week and I will order formal hepatic function panel which should be able to show indirect versus direct elevated bilirubin numbers.  I want to see him back in 2 months to make sure he is fully recovered from this acute illness.  For his chronic dry cough that is worse with supine position I recommended a trial of H2 blocker Pepcid/famotidine 20 mg pills 1 pill at bedtime every night.  Please see the "Patient Instructions" section for addition details about the plan.  Owens Loffler, MD [facility] 01/30/2022, 8:43 AM   Total time on date of encounter was 30 minutes (this included time spent preparing to see the patient reviewing records; obtaining and/or reviewing separately obtained history; performing a medically appropriate exam and/or evaluation; counseling and educating the patient and family if present; ordering medications, tests or procedures if applicable; and documenting clinical information in the health record).

## 2022-01-30 NOTE — Patient Instructions (Addendum)
If you are age 74 or older, your body mass index should be between 23-30. Your Body mass index is 27.09 kg/m?Russell Griffith If this is out of the aforementioned range listed, please consider follow up with your Primary Care Provider. ?________________________________________________________ ? ?DISCONTINUE: Augmentin after you take the 10th pill ? ?You will need to have lab work (LFT's) next week when you see Dr Alain Marion.   ? ?Please purchase the following medications over the counter and take as directed: ? ?START: Pepcid one tablet every night at bedtime. ? ?You are scheduled to follow up in our office on 04-01-22  at 8:50am. ?The [facility] providers would like to encourage you to use [facility] to communicate with providers for non-urgent requests or questions.  Due to long hold times on the telephone, sending your provider a message by [facility] may be a faster and more efficient way to get a response.  Please allow 48 business hours for a response.  Please remember that this is for non-urgent requests.  ?_______________________________________________________ ? ?Thank you for entrusting me with your care and choosing [facility]. ? ?Dr Ardis Hughs ? ? ?

## 2022-02-01 ENCOUNTER — Encounter: Payer: Self-pay | Admitting: Internal Medicine

## 2022-02-02 DIAGNOSIS — I484 Atypical atrial flutter: Secondary | ICD-10-CM | POA: Insufficient documentation

## 2022-02-02 DIAGNOSIS — I5032 Chronic diastolic (congestive) heart failure: Secondary | ICD-10-CM | POA: Insufficient documentation

## 2022-02-02 NOTE — Progress Notes (Signed)
Patient Care Team: [name], Evie Lacks, MD as PCP - General (Internal Medicine) Deboraha Sprang, MD as PCP - Electrophysiology (Cardiology) Stefanie Libel, MD (Family Medicine) Deboraha Sprang, MD (Cardiology) Jarome Matin, MD as Consulting Physician (Dermatology) Milus Banister, MD as Attending Physician (Gastroenterology) Minus Breeding, MD as Consulting Physician (Cardiology) Franchot Gallo, MD as Consulting Physician (Urology) Luberta Mutter, MD as Consulting Physician (Ophthalmology)   HPI  Russell Griffith is a 76 y.o. male Seen in followup for atrial fibrillation for which pulmonary he underwent pulmonary vein [facility] 2/20. He is also status post pacemaker implantation for significant pausing in the context of intermittent atrial fibrillation and sinus node dysfunction   History of coronary artery disease with bypass surgery 2000 and a Myoview November 2013 demonstrating no ischemia or scar. LV function was normal by echo February 2014 Had a history of exercise-induced polymorphic ventricular tachycardia, prompting  catheterization which demonstrated occlusion of 1 of his vein grafts but other conduits were open DATE TEST EF   2/19 LHC  55-60 % LIMA-LAD/SVG-D, SVG-PDA Patent SVG AM-OM2  12/19 Echo  60-65% LAE 41/2.0/33     Because of chronotropic incompetence, we made more aggressive his rate response.  This was associated with significant improvement.   Subsequently his device was reprogrammed AAIR to prevent inappropriate ventricular pacing as we were seen R on T.          A telehealth visit from 9/20 with Dr. Greggory Brandy is reviewed.  Repeat telehealth visit 09/27/2019 by Dr. Greggory Brandy.  The patient was in atrial flutter but was unaware.  Heart rate was regular and exercise tolerance preserved.  Given his severe right atrial enlargement, he was not in favor of further ablation therapy.  Antiarrhythmic options were left open if symptoms were to recur.  Hence now  effectively permanent atrial fibrillation  Two weeks ago became acutely ill in the setting of prior infrequent night sweats.  Lassitude and weakness, sufficient to the point of peri micturition syncope.  Eval >> elevated bili, transaminases, WBC ESR Also has anemia, with labs supporting Fe Def, currently needing to be worked up     He remains fit and without complaints of exercise tolerance   HR limited to about 120's   Atrial Fibrillation Management history:   Previous antiarrhythmic drugs: Tikosyn, Multaq Previous cardioversions: 10/14/18, 08/18/19 Previous ablations: remote at [facility], 01/25/19 CHADS2VASC score: 3 (age, HTN, CAD) Anticoagulation history: Xarelto         Past Medical History:  Diagnosis Date   Anxiety    Aortic stenosis    Mild, echo, April, 2014   BPH (benign prostatic hyperplasia)    CAD (coronary artery disease)    a. 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 [facility]   Precancerous lesion    Forehead   Primary osteoarthritis of left knee    Mild   Prolapsed internal hemorrhoids, grade 3 08/12/2015   PVC's (premature ventricular contractions)    Tubular adenoma of colon  Past Surgical History:  Procedure Laterality Date   ABLATION     PVI at [place] 01/25/2019   Procedure: [place];  Surgeon: [name], MD;  Location: [place] [facility];  Service: Cardiovascular;  Laterality: N/A;   CARDIOVERSION N/A 10/14/2018   Procedure: CARDIOVERSION;  Surgeon: Fay Records, MD;  Location: [place];  Service: Cardiovascular;  Laterality:  N/A;   CARDIOVERSION N/A 08/18/2019   Procedure: CARDIOVERSION;  Surgeon: Jerline Pain, MD;  Location: [place];  Service: Cardiovascular;  Laterality: N/A;   COLONOSCOPY     CORONARY ARTERY BYPASS GRAFT  2000   CABG X5   CYSTOSCOPY WITH RETROGRADE PYELOGRAM, URETEROSCOPY AND STENT PLACEMENT Bilateral 09/04/2020   Procedure: CYSTOSCOPY WITH BILATERAL RETROGRADE PYELOGRAM, RIGHT URETEROSCOPY AND RIGHT  STENT PLACEMENT;  Surgeon: Franchot Gallo, MD;  Location: [facility];  Service: Urology;  Laterality: Bilateral;   ESOPHAGOGASTRODUODENOSCOPY     HEAD & NECK SKIN LESION EXCISIONAL BIOPSY     HEMORRHOID BANDING     INTRAVASCULAR PRESSURE WIRE/FFR STUDY N/A 01/05/2018   Procedure: INTRAVASCULAR PRESSURE WIRE/FFR STUDY;  Surgeon: Sherren Mocha, MD;  Location: [place] [facility];  Service: Cardiovascular;  Laterality: N/A;   LEFT HEART CATH AND CORS/GRAFTS ANGIOGRAPHY N/A 01/05/2018   Procedure: LEFT HEART CATH AND CORS/GRAFTS ANGIOGRAPHY;  Surgeon: Sherren Mocha, MD;  Location: [place] [facility];  Service: Cardiovascular;  Laterality: N/A;   PERMANENT PACEMAKER INSERTION N/A 07/15/2012   Procedure: PERMANENT PACEMAKER INSERTION;  Surgeon: Deboraha Sprang, MD;  Location: [facility] CATH LAB;  Service: Cardiovascular;  Laterality: N/A;   rotator cuff surgery Right 2017   TONSILLECTOMY  1956   WISDOM TOOTH EXTRACTION      Current Outpatient Medications  Medication Sig Dispense Refill   amoxicillin-clavulanate (AUGMENTIN) 875-125 MG tablet Take 1 tablet by mouth 2 (two) times daily. 20 tablet 0   Cholecalciferol 25 MCG (1000 UT) capsule Take 1,000 Units by mouth daily with supper.      ELIQUIS 5 MG TABS tablet TAKE 1 TABLET BY MOUTH TWICE DAILY. 60 tablet 11   famotidine (PEPCID) 20 MG tablet Take 1 tablet (20 mg total) by mouth at bedtime.     losartan (COZAAR) 25 MG tablet Take 25 mg by mouth daily.     NONFORMULARY OR COMPOUNDED ITEM Apply 1 application topically daily as needed (rash). Cetaphil +  triamcinolone cream     pantoprazole (PROTONIX) 40 MG tablet TAKE 1 TABLET ONCE DAILY. 90 tablet 3   No current facility-administered medications for this visit.    Allergies  Allergen Reactions   Spironolactone     Cramps, dizziness   Cayenne Other (See Comments)    Sweats w/paprika too   Niacin And Related Other (See Comments)    Upset stomach   Sulfa Antibiotics Itching and Rash    "have no idea; mother told me I was allergic to" "have no idea; mother told me I was allergic to"   Sulfonamide Derivatives Other (See Comments)    "have no idea; mother told me I was allergic to"    Review of Systems negative except from HPI and PMH  Physical Exam  BP 128/66    Pulse 61    Ht '5\' 11"'  (1.803 m)    Wt 193 lb (87.5 kg)    SpO2 96%    BMI 26.92 kg/m   Well developed and well nourished in no acute distress HENT normal Neck supple with JVP-flat Clear  Device pocket well healed; without hematoma or erythema.  There is no tethering  Irregularly Irregular rate and rhythm with controlled   ventricular response , no  gallop 2/6 murmur Abd-soft with active BS No Clubbing cyanosis   edema Skin-warm and dry A & Oriented  Grossly normal sensory and motor function     ECG  Atrial fib @ 63  -/10/43 ECG 5/22  Afib  ECG 9/21 AFib Assessment and  Plam  PACs  Atrial fibrillation s/p PVI [facility] and [facility] 2020  Sinus node dysfunction  Ischemic heart disease with prior bypass surgery  Pacemaker  [employer]  Pacemaker function-abnormal resulting in R on T pacing ( multiple reprogramings)  Dizziness   His device is approaching ERI, it was implanted for sinus node dysfunction and not that is AFib is permanent there is no indication to replace it.  Indeed, it would be reasonable to consider system extraction as it, while will incur risk, the likelihood of an issue in the future, including already having had to forgo MRI in the future is high.  Will arrange a consultation w GT  The  acute illness of two weeks ago seems to be resolving, leaving questions in its wake, and objective evidence of hepatitis, leukocytosis, elevated ESR and in the context of a previously identified anemia.  One device related concern is infection.  As he has been treated with aBx until 2 days ago, have reached out to Dr CS (ID) to ask re appropriate window to draw surveillance cultures.  That he has had some prior to then night sweats leaves me concerned  His anemia is concerning   Iron deficiency has been identified and I have told him it is imperative that the source of that blood loss be sought.  I will reach out to his PCP and will also ask to order reticulocyte count.  Anemia can also be seen with indolent device infection  Dizziness with neck extension is unlikely ENT   we were not able to demonstrate significant changes in HR, although there was some slowing, but CSH can have an isolated vasodepressor response and the history is certainly provocative   I will have to look into whether VBI can also cause similar symptoms

## 2022-02-03 ENCOUNTER — Encounter: Payer: Self-pay | Admitting: Internal Medicine

## 2022-02-03 ENCOUNTER — Ambulatory Visit: Payer: PPO | Admitting: Internal Medicine

## 2022-02-03 ENCOUNTER — Other Ambulatory Visit: Payer: Self-pay

## 2022-02-03 VITALS — BP 128/66 | HR 61 | Ht 71.0 in | Wt 193.0 lb

## 2022-02-03 DIAGNOSIS — Z950 Presence of cardiac pacemaker: Secondary | ICD-10-CM

## 2022-02-03 DIAGNOSIS — I484 Atypical atrial flutter: Secondary | ICD-10-CM

## 2022-02-03 DIAGNOSIS — I5032 Chronic diastolic (congestive) heart failure: Secondary | ICD-10-CM | POA: Diagnosis not present

## 2022-02-03 DIAGNOSIS — I495 Sick sinus syndrome: Secondary | ICD-10-CM

## 2022-02-03 DIAGNOSIS — I480 Paroxysmal atrial fibrillation: Secondary | ICD-10-CM

## 2022-02-03 NOTE — Patient Instructions (Signed)
Medication Instructions:  ?Your physician recommends that you continue on your current medications as directed. Please refer to the Current Medication list given to you today. ? ?*If you need a refill on your cardiac medications before your next appointment, please call your pharmacy* ? ? ?Lab Work: ?None ordered. ? ?If you have labs (blood work) drawn today and your tests are completely normal, you will receive your results only by: ?MyChart Message (if you have MyChart) OR ?A paper copy in the mail ?If you have any lab test that is abnormal or we need to change your treatment, we will call you to review the results. ? ? ?Testing/Procedures: ?None ordered. ? ? ? ?Follow-Up: ?At [facility], you and your health needs are our priority.  As part of our continuing mission to provide you with exceptional heart care, we have created designated Provider Care Teams.  These Care Teams include your primary Cardiologist (physician) and Advanced Practice Providers (APPs -  Physician Assistants and Nurse Practitioners) who all work together to provide you with the care you need, when you need it. ? ?We recommend signing up for the patient portal called "MyChart".  Sign up information is provided on this After Visit Summary.  MyChart is used to connect with patients for Virtual Visits (Telemedicine).  Patients are able to view lab/test results, encounter notes, upcoming appointments, etc.  Non-urgent messages can be sent to your provider as well.   ?To learn more about what you can do with MyChart, go to [email or web address].   ? ?Your next appointment:   ?08/11/2022 at [place] with Dr Lovena Le to discuss system extraction ?

## 2022-02-04 ENCOUNTER — Other Ambulatory Visit: Payer: Self-pay

## 2022-02-04 LAB — CUP PACEART INCLINIC DEVICE CHECK
Date Time Interrogation Session: 20230307000000
Implantable Lead Implant Date: 20130816
Implantable Lead Implant Date: 20130816
Implantable Lead Location: 753859
Implantable Lead Location: 753860
Implantable Lead Model: 5076
Implantable Lead Model: 5076
Implantable Pulse Generator Implant Date: 20130816
Lead Channel Impedance Value: 494 Ohm
Lead Channel Impedance Value: 925 Ohm
Lead Channel Pacing Threshold Amplitude: 0.9 V
Lead Channel Pacing Threshold Amplitude: 2 V
Lead Channel Pacing Threshold Pulse Width: 0.4 ms
Lead Channel Pacing Threshold Pulse Width: 0.7 ms
Lead Channel Sensing Intrinsic Amplitude: 0.7 mV
Lead Channel Sensing Intrinsic Amplitude: 17.9 mV
Lead Channel Setting Pacing Amplitude: 2 V
Lead Channel Setting Sensing Sensitivity: 2.5 mV
Pulse Gen Serial Number: 111255

## 2022-02-05 ENCOUNTER — Ambulatory Visit (INDEPENDENT_AMBULATORY_CARE_PROVIDER_SITE_OTHER): Payer: PPO | Admitting: Internal Medicine

## 2022-02-05 ENCOUNTER — Other Ambulatory Visit: Payer: Self-pay

## 2022-02-05 ENCOUNTER — Encounter: Payer: Self-pay | Admitting: Internal Medicine

## 2022-02-05 VITALS — BP 110/70 | HR 60 | Temp 97.9°F | Ht 71.0 in | Wt 190.0 lb

## 2022-02-05 DIAGNOSIS — R42 Dizziness and giddiness: Secondary | ICD-10-CM | POA: Diagnosis not present

## 2022-02-05 DIAGNOSIS — R1013 Epigastric pain: Secondary | ICD-10-CM | POA: Diagnosis not present

## 2022-02-05 DIAGNOSIS — D509 Iron deficiency anemia, unspecified: Secondary | ICD-10-CM | POA: Insufficient documentation

## 2022-02-05 DIAGNOSIS — R17 Unspecified jaundice: Secondary | ICD-10-CM

## 2022-02-05 DIAGNOSIS — I10 Essential (primary) hypertension: Secondary | ICD-10-CM

## 2022-02-05 DIAGNOSIS — D649 Anemia, unspecified: Secondary | ICD-10-CM | POA: Diagnosis not present

## 2022-02-05 DIAGNOSIS — R7989 Other specified abnormal findings of blood chemistry: Secondary | ICD-10-CM | POA: Diagnosis not present

## 2022-02-05 DIAGNOSIS — R5383 Other fatigue: Secondary | ICD-10-CM | POA: Diagnosis not present

## 2022-02-05 MED ORDER — POTASSIUM CHLORIDE CRYS ER 10 MEQ PO TBCR: 10.0000 meq | EXTENDED_RELEASE_TABLET | Freq: Every day | ORAL | 1 refills | Status: DC

## 2022-02-05 MED ORDER — LOSARTAN POTASSIUM 25 MG PO TABS: 12.5000 mg | ORAL_TABLET | Freq: Every day | ORAL | 1 refills | Status: DC

## 2022-02-05 NOTE — Assessment & Plan Note (Signed)
Stop Losartan if weak, dizzy; re-start at 12.5 mg at night if BP is trending up ?

## 2022-02-05 NOTE — Assessment & Plan Note (Addendum)
Much better.  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  Other etiologies are being considered as well.  Negative work-up so far. ?CT was normal.  Repeat labs in 1 week.  Follow-up with Dr. Ardis Hughs ?

## 2022-02-05 NOTE — Assessment & Plan Note (Addendum)
Normocytic and mild.  Obtain lab work in 1 week-see orders.   ?He has occasional bleeding hemorrhoids and he had a lot of blood work done lately.   ? ? ?

## 2022-02-05 NOTE — Assessment & Plan Note (Signed)
Stop losartan due to lightheadedness.  Restart at 12.5 mg daily if blood pressure is trending up ?

## 2022-02-05 NOTE — Patient Instructions (Addendum)
Stop Losartan if weak, dizzy; re-start at 12.5 mg at night if BP is trending up ?

## 2022-02-05 NOTE — Assessment & Plan Note (Signed)
Status post recent GI illness with jaundice.  It is better. ?Hold losartan ?Okay to start exercising ?Replace low potassium ?

## 2022-02-05 NOTE — Progress Notes (Signed)
Subjective:  Patient ID: Russell Griffith, male    DOB: [birth year]  Age: 76 y.o. MRN: [id number]  CC: No chief complaint on file.   HPI Russell Griffith presents for jaundice, weakness, anemia, A-fib He is 70% back to normal.  She is still complaining of fatigue, weakness and lightheadedness with position change.  He has not started working out yet.  Has an appetite.  He is avoiding sweets, lost some weight.  No fever, dark urine, diarrhea  Outpatient Medications Prior to Visit  Medication Sig Dispense Refill   Cholecalciferol 25 MCG (1000 UT) capsule Take 1,000 Units by mouth daily with supper.      ELIQUIS 5 MG TABS tablet TAKE 1 TABLET BY MOUTH TWICE DAILY. 60 tablet 11   famotidine (PEPCID) 20 MG tablet Take 1 tablet (20 mg total) by mouth at bedtime.     NONFORMULARY OR COMPOUNDED ITEM Apply 1 application topically daily as needed (rash). Cetaphil + triamcinolone cream     pantoprazole (PROTONIX) 40 MG tablet TAKE 1 TABLET ONCE DAILY. 90 tablet 3   losartan (COZAAR) 25 MG tablet Take 25 mg by mouth daily.     amoxicillin-clavulanate (AUGMENTIN) 875-125 MG tablet Take 1 tablet by mouth 2 (two) times daily. 20 tablet 0   No facility-administered medications prior to visit.    ROS: Review of Systems  Constitutional:  Positive for fatigue and unexpected weight change. Negative for appetite change.  HENT:  Negative for congestion, nosebleeds, sneezing, sore throat and trouble swallowing.   Eyes:  Negative for itching and visual disturbance.  Respiratory:  Negative for cough.   Cardiovascular:  Negative for chest pain, palpitations and leg swelling.  Gastrointestinal:  Negative for abdominal distention, blood in stool, diarrhea and nausea.  Genitourinary:  Negative for frequency and hematuria.  Musculoskeletal:  Negative for back pain, gait problem, joint swelling and neck pain.  Skin:  Negative for rash.  Neurological:  Positive for weakness. Negative for dizziness, tremors  and speech difficulty.  Psychiatric/Behavioral:  Negative for agitation, dysphoric mood and sleep disturbance. The patient is not nervous/anxious.    Objective:  BP 110/70 (BP Location: Left Arm, Patient Position: Sitting, Cuff Size: Large)    Pulse 60    Temp 97.9 F (36.6 C) (Oral)    Ht '5\' 11"'$  (1.803 m)    Wt 190 lb (86.2 kg)    SpO2 96%    BMI 26.50 kg/m   BP Readings from Last 3 Encounters:  02/05/22 110/70  02/03/22 128/66  01/30/22 (!) 102/50    Wt Readings from Last 3 Encounters:  02/05/22 190 lb (86.2 kg)  02/03/22 193 lb (87.5 kg)  01/30/22 194 lb 4 oz (88.1 kg)    Physical Exam Constitutional:      General: He is not in acute distress.    Appearance: Normal appearance. He is well-developed.     Comments: NAD  Eyes:     Conjunctiva/sclera: Conjunctivae normal.     Pupils: Pupils are equal, round, and reactive to light.  Neck:     Thyroid: No thyromegaly.     Vascular: No JVD.  Cardiovascular:     Rate and Rhythm: Normal rate and regular rhythm.     Heart sounds: Normal heart sounds. No murmur heard.   No friction rub. No gallop.  Pulmonary:     Effort: Pulmonary effort is normal. No respiratory distress.     Breath sounds: Normal breath sounds. No wheezing or rales.  Chest:  Chest wall: No tenderness.  Abdominal:     General: Bowel sounds are normal. There is no distension.     Palpations: Abdomen is soft. There is no mass.     Tenderness: There is no abdominal tenderness. There is no guarding or rebound.  Musculoskeletal:        General: No tenderness. Normal range of motion.     Cervical back: Normal range of motion.  Lymphadenopathy:     Cervical: No cervical adenopathy.  Skin:    General: Skin is warm and dry.     Findings: No rash.  Neurological:     Mental Status: He is alert and oriented to person, place, and time.     Cranial Nerves: No cranial nerve deficit.     Motor: No abnormal muscle tone.     Coordination: Coordination normal.      Gait: Gait normal.     Deep Tendon Reflexes: Reflexes are normal and symmetric.  Psychiatric:        Behavior: Behavior normal.        Thought Content: Thought content normal.        Judgment: Judgment normal.  Ed looks better; no icterus Still little tired He got lightheaded when sat up  Lab Results  Component Value Date   WBC 8.1 01/30/2022   HGB 11.7 (L) 01/30/2022   HCT 35.5 (L) 01/30/2022   PLT 283.0 01/30/2022   GLUCOSE 109 (H) 01/28/2022   CHOL 107 09/29/2021   TRIG 56.0 09/29/2021   HDL 32.70 (L) 09/29/2021   LDLCALC 63 09/29/2021   ALT 48 (H) 01/28/2022   AST 45 (H) 01/28/2022   NA 130 (L) 01/28/2022   K 3.4 (L) 01/28/2022   CL 97 (L) 01/28/2022   CREATININE 1.06 01/28/2022   BUN 29 (H) 01/28/2022   CO2 25 01/28/2022   TSH 4.55 09/29/2021   PSA 0.81 09/29/2021   INR 1.53 (H) 07/15/2012   HGBA1C 5.8 07/30/2015    CT ABDOMEN PELVIS W CONTRAST  Result Date: 01/28/2022 CLINICAL DATA:  Nausea and vomiting, generalized weakness for 1 week, jaundice EXAM: CT ABDOMEN AND PELVIS WITH CONTRAST TECHNIQUE: Multidetector CT imaging of the abdomen and pelvis was performed using the standard protocol following bolus administration of intravenous contrast. RADIATION DOSE REDUCTION: This exam was performed according to the departmental dose-optimization program which includes automated exposure control, adjustment of the mA and/or kV according to patient size and/or use of iterative reconstruction technique. CONTRAST:  155m OMNIPAQUE IOHEXOL 300 MG/ML  SOLN COMPARISON:  08/17/2020 FINDINGS: Lower chest: Trace bilateral pleural effusions. No acute airspace disease. Minimal bibasilar atelectasis. Pacemaker lead within the right ventricle. Hepatobiliary: Diffuse hepatic steatosis. No focal liver abnormality or intrahepatic biliary duct dilation. The gallbladder is decompressed, with no evidence of cholelithiasis or cholecystitis. No evidence of common bile duct dilation or  choledocholithiasis. Pancreas: Unremarkable. No pancreatic ductal dilatation or surrounding inflammatory changes. Spleen: Stable borderline splenomegaly, measuring up to 13 cm in length. No focal parenchymal abnormalities. Adrenals/Urinary Tract: Punctate 3 mm nonobstructing left renal calculus unchanged. No obstructive uropathy within either kidney. Normal bilateral renal parenchymal enhancement. Adrenals and bladder are grossly unremarkable. Stomach/Bowel: No bowel obstruction or ileus. Normal appendix right lower quadrant. No bowel wall thickening or inflammatory change. Vascular/Lymphatic: Mild atherosclerosis of the aorta again noted. Borderline enlarged adenopathy within the porta hepatis and upper abdominal retroperitoneum, with index lymph node in the porta hepatis measuring 16 mm in short axis reference image 28/2. Previously this had measured approximately  14 mm. No new pathologic adenopathy. Reproductive: Prostate is unremarkable. Other: No free fluid or free gas.  No abdominal wall hernia. Musculoskeletal: No acute or destructive bony lesions. Reconstructed images demonstrate no additional findings. IMPRESSION: 1. No evidence of cholelithiasis, cholecystitis, or choledocholithiasis. No biliary duct dilation. 2. Hepatic steatosis. 3. Stable borderline splenomegaly. 4. Trace bilateral pleural effusions. 5. Stable 3 mm nonobstructing left renal calculus. 6. Stable upper abdominal retroperitoneal and porta hepatis lymph nodes, likely reactive. 7.  Aortic Atherosclerosis (ICD10-I70.0). Electronically Signed   By: Randa Ngo M.D.   On: 01/28/2022 16:24    Assessment & Plan:   Problem List Items Addressed This Visit     Abnormal LFTs - Primary   Anemia    Normocytic and mild.  Obtain lab work in 1 week-see orders.   He has occasional bleeding hemorrhoids and he had a lot of blood work done lately.          Relevant Orders   CBC with Differential/Platelet   Iron, TIBC and Ferritin Panel    Sedimentation rate   C-reactive protein   Reticulocytes   Epigastric pain    No relapse.      Fatigue    Status post recent GI illness with jaundice.  It is better. Hold losartan Okay to start exercising Replace low potassium      HTN (hypertension)    Stop losartan due to lightheadedness.  Restart at 12.5 mg daily if blood pressure is trending up      Relevant Medications   losartan (COZAAR) 25 MG tablet   Jaundice    Much better.  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  Other etiologies are being considered as well.  Negative work-up so far. CT was normal.  Repeat labs in 1 week.  Follow-up with Dr. Ardis Hughs      Relevant Orders   CBC with Differential/Platelet   Comprehensive metabolic panel   Iron, TIBC and Ferritin Panel   Sedimentation rate   C-reactive protein   Reticulocytes   Lightheadedness    Stop Losartan if weak, dizzy; re-start at 12.5 mg at night if BP is trending up         Meds ordered this encounter  Medications   losartan (COZAAR) 25 MG tablet    Sig: Take 0.5 tablets (12.5 mg total) by mouth daily.    Dispense:  90 tablet    Refill:  1   potassium chloride (KLOR-CON M) 10 MEQ tablet    Sig: Take 1 tablet (10 mEq total) by mouth daily.    Dispense:  30 tablet    Refill:  1      Follow-up: Return in about 6 weeks (around 03/19/2022) for a follow-up visit.  Walker Kehr, MD

## 2022-02-05 NOTE — Assessment & Plan Note (Signed)
No relapse 

## 2022-02-11 ENCOUNTER — Other Ambulatory Visit (INDEPENDENT_AMBULATORY_CARE_PROVIDER_SITE_OTHER): Payer: PPO

## 2022-02-11 DIAGNOSIS — D649 Anemia, unspecified: Secondary | ICD-10-CM | POA: Diagnosis not present

## 2022-02-11 DIAGNOSIS — R17 Unspecified jaundice: Secondary | ICD-10-CM

## 2022-02-11 LAB — CBC WITH DIFFERENTIAL/PLATELET
Basophils Absolute: 0.1 10*3/uL (ref 0.0–0.1)
Basophils Relative: 1.4 % (ref 0.0–3.0)
Eosinophils Absolute: 0 10*3/uL (ref 0.0–0.7)
Eosinophils Relative: 0.5 % (ref 0.0–5.0)
HCT: 38.2 % — ABNORMAL LOW (ref 39.0–52.0)
Hemoglobin: 12.4 g/dL — ABNORMAL LOW (ref 13.0–17.0)
Lymphocytes Relative: 26.6 % (ref 12.0–46.0)
Lymphs Abs: 1.3 10*3/uL (ref 0.7–4.0)
MCHC: 32.4 g/dL (ref 30.0–36.0)
MCV: 81.5 fl (ref 78.0–100.0)
Monocytes Absolute: 0.9 10*3/uL (ref 0.1–1.0)
Monocytes Relative: 17.1 % — ABNORMAL HIGH (ref 3.0–12.0)
Neutro Abs: 2.8 10*3/uL (ref 1.4–7.7)
Neutrophils Relative %: 54.4 % (ref 43.0–77.0)
Platelets: 250 10*3/uL (ref 150.0–400.0)
RBC: 4.68 Mil/uL (ref 4.22–5.81)
RDW: 17 % — ABNORMAL HIGH (ref 11.5–15.5)
WBC: 5.1 10*3/uL (ref 4.0–10.5)

## 2022-02-11 LAB — COMPREHENSIVE METABOLIC PANEL
ALT: 23 U/L (ref 0–53)
AST: 26 U/L (ref 0–37)
Albumin: 4.2 g/dL (ref 3.5–5.2)
Alkaline Phosphatase: 111 U/L (ref 39–117)
BUN: 17 mg/dL (ref 6–23)
CO2: 30 mEq/L (ref 19–32)
Calcium: 9.8 mg/dL (ref 8.4–10.5)
Chloride: 104 mEq/L (ref 96–112)
Creatinine, Ser: 1 mg/dL (ref 0.40–1.50)
GFR: 74.37 mL/min (ref >60.00)
Glucose, Bld: 95 mg/dL (ref 70–99)
Potassium: 4 mEq/L (ref 3.5–5.1)
Sodium: 140 mEq/L (ref 135–145)
Total Bilirubin: 2.1 mg/dL — ABNORMAL HIGH (ref 0.2–1.2)
Total Protein: 7.1 g/dL (ref 6.0–8.3)

## 2022-02-11 LAB — C-REACTIVE PROTEIN: CRP: <1 mg/dL (ref 0.5–20.0)

## 2022-02-11 LAB — SEDIMENTATION RATE: Sed Rate: 30 mm/hr — ABNORMAL HIGH (ref 0–20)

## 2022-02-12 LAB — RETICULOCYTES
ABS Retic: 51590 cells/uL (ref 25000–90000)
Retic Ct Pct: 1.1 %

## 2022-02-12 LAB — IRON,TIBC AND FERRITIN PANEL
%SAT: 20 % (calc) (ref 20–48)
Ferritin: 78 ng/mL (ref 24–380)
Iron: 80 ug/dL (ref 50–180)
TIBC: 405 mcg/dL (calc) (ref 250–425)

## 2022-03-05 ENCOUNTER — Other Ambulatory Visit: Payer: Self-pay | Admitting: Internal Medicine

## 2022-03-18 ENCOUNTER — Ambulatory Visit (INDEPENDENT_AMBULATORY_CARE_PROVIDER_SITE_OTHER): Payer: PPO | Admitting: Internal Medicine

## 2022-03-18 ENCOUNTER — Encounter: Payer: Self-pay | Admitting: Internal Medicine

## 2022-03-18 DIAGNOSIS — R5383 Other fatigue: Secondary | ICD-10-CM | POA: Diagnosis not present

## 2022-03-18 DIAGNOSIS — I10 Essential (primary) hypertension: Secondary | ICD-10-CM | POA: Diagnosis not present

## 2022-03-18 DIAGNOSIS — E785 Hyperlipidemia, unspecified: Secondary | ICD-10-CM

## 2022-03-18 DIAGNOSIS — R42 Dizziness and giddiness: Secondary | ICD-10-CM

## 2022-03-18 DIAGNOSIS — R1013 Epigastric pain: Secondary | ICD-10-CM

## 2022-03-18 MED ORDER — PRAVASTATIN SODIUM 10 MG PO TABS: 10.0000 mg | ORAL_TABLET | Freq: Every day | ORAL | 3 refills | Status: DC

## 2022-03-18 NOTE — Progress Notes (Signed)
? ?Subjective:  ?Patient ID: Russell Griffith, male    DOB: 17-Aug-1946  Age: 76 y.o. MRN: [id number] ? ?CC: No chief complaint on file. ? ? ?HPI ?[name] presents for fatigue, SOB w/A fib ?C/o periodic abd pain worse w/movement - better off Lipitor ? ? ?Outpatient Medications Prior to Visit  ?Medication Sig Dispense Refill  ? Cholecalciferol 25 MCG (1000 UT) capsule Take 1,000 Units by mouth daily with supper.     ? ELIQUIS 5 MG TABS tablet TAKE 1 TABLET BY MOUTH TWICE DAILY. 60 tablet 11  ? famotidine (PEPCID) 20 MG tablet Take 1 tablet (20 mg total) by mouth at bedtime.    ? losartan (COZAAR) 25 MG tablet Take 0.5 tablets (12.5 mg total) by mouth daily. 90 tablet 1  ? NONFORMULARY OR COMPOUNDED ITEM Apply 1 application topically daily as needed (rash). Cetaphil + triamcinolone cream    ? pantoprazole (PROTONIX) 40 MG tablet TAKE 1 TABLET ONCE DAILY. 90 tablet 3  ? potassium chloride (KLOR-CON M) 10 MEQ tablet Take 1 tablet (10 mEq total) by mouth daily. 30 tablet 1  ? ?No facility-administered medications prior to visit.  ? ? ?ROS: ?Review of Systems  ?Constitutional:  Positive for fatigue. Negative for appetite change and unexpected weight change.  ?HENT:  Negative for congestion, nosebleeds, sneezing, sore throat and trouble swallowing.   ?Eyes:  Negative for itching and visual disturbance.  ?Respiratory:  Negative for cough.   ?Cardiovascular:  Negative for chest pain, palpitations and leg swelling.  ?Gastrointestinal:  Negative for abdominal distention, blood in stool, diarrhea and nausea.  ?Genitourinary:  Negative for frequency and hematuria.  ?Musculoskeletal:  Positive for arthralgias and myalgias. Negative for back pain, gait problem, joint swelling and neck pain.  ?Skin:  Negative for rash.  ?Neurological:  Negative for dizziness, tremors, speech difficulty and weakness.  ?Psychiatric/Behavioral:  Negative for agitation, dysphoric mood, sleep disturbance and suicidal ideas. The patient is  not nervous/anxious.   ? ?Objective:  ?BP 118/64 (BP Location: Left Arm, Patient Position: Sitting, Cuff Size: Large)   Pulse 60   Temp 98.4 ?F (36.9 ?C) (Oral)   Ht '5\' 11"'$  (1.803 m)   Wt 194 lb (88 kg)   SpO2 97%   BMI 27.06 kg/m?  ? ?BP Readings from Last 3 Encounters:  ?03/18/22 118/64  ?02/05/22 110/70  ?02/03/22 128/66  ? ? ?Wt Readings from Last 3 Encounters:  ?03/18/22 194 lb (88 kg)  ?02/05/22 190 lb (86.2 kg)  ?02/03/22 193 lb (87.5 kg)  ? ? ?Physical Exam ?Constitutional:   ?   General: He is not in acute distress. ?   Appearance: He is well-developed.  ?   Comments: NAD  ?Eyes:  ?   Conjunctiva/sclera: Conjunctivae normal.  ?   Pupils: Pupils are equal, round, and reactive to light.  ?Neck:  ?   Thyroid: No thyromegaly.  ?   Vascular: No JVD.  ?Cardiovascular:  ?   Rate and Rhythm: Normal rate and regular rhythm.  ?   Heart sounds: Normal heart sounds. No murmur heard. ?  No friction rub. No gallop.  ?Pulmonary:  ?   Effort: Pulmonary effort is normal. No respiratory distress.  ?   Breath sounds: Normal breath sounds. No wheezing or rales.  ?Chest:  ?   Chest wall: No tenderness.  ?Abdominal:  ?   General: Bowel sounds are normal. There is no distension.  ?   Palpations: Abdomen is soft. There is no mass.  ?  Tenderness: There is no abdominal tenderness. There is no guarding or rebound.  ?Musculoskeletal:     ?   General: No tenderness. Normal range of motion.  ?   Cervical back: Normal range of motion.  ?Lymphadenopathy:  ?   Cervical: No cervical adenopathy.  ?Skin: ?   General: Skin is warm and dry.  ?   Findings: No rash.  ?Neurological:  ?   Mental Status: He is alert and oriented to person, place, and time.  ?   Cranial Nerves: No cranial nerve deficit.  ?   Motor: No abnormal muscle tone.  ?   Coordination: Coordination normal.  ?   Gait: Gait normal.  ?   Deep Tendon Reflexes: Reflexes are normal and symmetric.  ?Psychiatric:     ?   Behavior: Behavior normal.     ?   Thought Content:  Thought content normal.     ?   Judgment: Judgment normal.  ? ? ?Lab Results  ?Component Value Date  ? WBC 5.1 02/11/2022  ? HGB 12.4 (L) 02/11/2022  ? HCT 38.2 (L) 02/11/2022  ? PLT 250.0 02/11/2022  ? GLUCOSE 95 02/11/2022  ? CHOL 107 09/29/2021  ? TRIG 56.0 09/29/2021  ? HDL 32.70 (L) 09/29/2021  ? [place] 63 09/29/2021  ? ALT 23 02/11/2022  ? AST 26 02/11/2022  ? NA 140 02/11/2022  ? K 4.0 02/11/2022  ? CL 104 02/11/2022  ? CREATININE 1.00 02/11/2022  ? BUN 17 02/11/2022  ? CO2 30 02/11/2022  ? TSH 4.55 09/29/2021  ? PSA 0.81 09/29/2021  ? INR 1.53 (H) 07/15/2012  ? HGBA1C 5.8 07/30/2015  ? ? ?CT ABDOMEN PELVIS W CONTRAST ? ?Result Date: 01/28/2022 ?CLINICAL DATA:  Nausea and vomiting, generalized weakness for 1 week, jaundice EXAM: CT ABDOMEN AND PELVIS WITH CONTRAST TECHNIQUE: Multidetector CT imaging of the abdomen and pelvis was performed using the standard protocol following bolus administration of intravenous contrast. RADIATION DOSE REDUCTION: This exam was performed according to the departmental dose-optimization program which includes automated exposure control, adjustment of the mA and/or kV according to patient size and/or use of iterative reconstruction technique. CONTRAST:  162m OMNIPAQUE IOHEXOL 300 MG/ML  SOLN COMPARISON:  08/17/2020 FINDINGS: Lower chest: Trace bilateral pleural effusions. No acute airspace disease. Minimal bibasilar atelectasis. Pacemaker lead within the right ventricle. Hepatobiliary: Diffuse hepatic steatosis. No focal liver abnormality or intrahepatic biliary duct dilation. The gallbladder is decompressed, with no evidence of cholelithiasis or cholecystitis. No evidence of common bile duct dilation or choledocholithiasis. Pancreas: Unremarkable. No pancreatic ductal dilatation or surrounding inflammatory changes. Spleen: Stable borderline splenomegaly, measuring up to 13 cm in length. No focal parenchymal abnormalities. Adrenals/Urinary Tract: Punctate 3 mm nonobstructing left  renal calculus unchanged. No obstructive uropathy within either kidney. Normal bilateral renal parenchymal enhancement. Adrenals and bladder are grossly unremarkable. Stomach/Bowel: No bowel obstruction or ileus. Normal appendix right lower quadrant. No bowel wall thickening or inflammatory change. Vascular/Lymphatic: Mild atherosclerosis of the aorta again noted. Borderline enlarged adenopathy within the porta hepatis and upper abdominal retroperitoneum, with index lymph node in the porta hepatis measuring 16 mm in short axis reference image 28/2. Previously this had measured approximately 14 mm. No new pathologic adenopathy. Reproductive: Prostate is unremarkable. Other: No free fluid or free gas.  No abdominal wall hernia. Musculoskeletal: No acute or destructive bony lesions. Reconstructed images demonstrate no additional findings. IMPRESSION: 1. No evidence of cholelithiasis, cholecystitis, or choledocholithiasis. No biliary duct dilation. 2. Hepatic steatosis. 3. Stable borderline splenomegaly.  4. Trace bilateral pleural effusions. 5. Stable 3 mm nonobstructing left renal calculus. 6. Stable upper abdominal retroperitoneal and porta hepatis lymph nodes, likely reactive. 7.  Aortic Atherosclerosis (ICD10-I70.0). Electronically Signed   By: Randa Ngo M.D.   On: 01/28/2022 16:24  ? ? ?Assessment & Plan:  ? ?Problem List Items Addressed This Visit   ? ? HTN (hypertension)  ?  Cont on Losartan ? ?  ?  ? Relevant Medications  ? pravastatin (PRAVACHOL) 10 MG tablet  ? Dyslipidemia  ?  Periodic abd pain worse w/movement - better off Lipitor ?Start Livalo if covered ?  ?  ? Relevant Medications  ? pravastatin (PRAVACHOL) 10 MG tablet  ? Other Relevant Orders  ? Comprehensive metabolic panel  ? Lipid panel  ? Epigastric pain  ?   ?If you get this upper abdominal pain again: hold Lipitor, stop alcohol, stay on a low fat diet, hydrate well until it is gone. ?Try digestive enzymes ? ?  ?  ? Fatigue  ?  Better a little  or none - - may be related to A fib ?F/u w/Dr Percival Spanish soon ? ?  ?  ? Lightheadedness  ?  Wt Readings from Last 3 Encounters:  ?03/18/22 194 lb (88 kg)  ?02/05/22 190 lb (86.2 kg)  ?02/03/22 193 lb (87.5 kg)  ?

## 2022-03-18 NOTE — Assessment & Plan Note (Addendum)
Better a little or none - - may be related to A fib ?F/u w/Dr Percival Spanish soon ?

## 2022-03-18 NOTE — Assessment & Plan Note (Signed)
Cont on Losartan °

## 2022-03-18 NOTE — Assessment & Plan Note (Signed)
Wt Readings from Last 3 Encounters:  ?03/18/22 194 lb (88 kg)  ?02/05/22 190 lb (86.2 kg)  ?02/03/22 193 lb (87.5 kg)  ?Resolved ? ?

## 2022-03-18 NOTE — Assessment & Plan Note (Signed)
Periodic abd pain worse w/movement - better off Lipitor ?Start Livalo if covered ?

## 2022-03-18 NOTE — Assessment & Plan Note (Signed)
?  If you get this upper abdominal pain again: hold Lipitor, stop alcohol, stay on a low fat diet, hydrate well until it is gone. ?Try digestive enzymes ?

## 2022-03-20 ENCOUNTER — Ambulatory Visit (INDEPENDENT_AMBULATORY_CARE_PROVIDER_SITE_OTHER): Payer: PPO

## 2022-03-20 DIAGNOSIS — I495 Sick sinus syndrome: Secondary | ICD-10-CM | POA: Diagnosis not present

## 2022-03-20 LAB — CUP PACEART REMOTE DEVICE CHECK
Battery Remaining Longevity: 12 mo
Battery Remaining Percentage: 16 %
Brady Statistic RA Percent Paced: 0 %
Brady Statistic RV Percent Paced: 0 %
Date Time Interrogation Session: 20230421013100
Implantable Lead Implant Date: 20130816
Implantable Lead Implant Date: 20130816
Implantable Lead Location: 753859
Implantable Lead Location: 753860
Implantable Lead Model: 5076
Implantable Lead Model: 5076
Implantable Pulse Generator Implant Date: 20130816
Lead Channel Impedance Value: 486 Ohm
Lead Channel Impedance Value: 918 Ohm
Lead Channel Pacing Threshold Amplitude: 0.9 V
Lead Channel Pacing Threshold Pulse Width: 0.4 ms
Lead Channel Setting Pacing Amplitude: 2 V
Lead Channel Setting Sensing Sensitivity: 2.5 mV
Pulse Gen Serial Number: 111255

## 2022-04-01 ENCOUNTER — Ambulatory Visit: Payer: PPO | Admitting: Gastroenterology

## 2022-04-07 NOTE — Progress Notes (Signed)
Remote pacemaker transmission.   

## 2022-04-14 ENCOUNTER — Other Ambulatory Visit (INDEPENDENT_AMBULATORY_CARE_PROVIDER_SITE_OTHER): Payer: PPO

## 2022-04-14 DIAGNOSIS — R42 Dizziness and giddiness: Secondary | ICD-10-CM | POA: Diagnosis not present

## 2022-04-14 DIAGNOSIS — E785 Hyperlipidemia, unspecified: Secondary | ICD-10-CM | POA: Diagnosis not present

## 2022-04-14 LAB — COMPREHENSIVE METABOLIC PANEL
ALT: 20 U/L (ref 0–53)
AST: 28 U/L (ref 0–37)
Albumin: 4.1 g/dL (ref 3.5–5.2)
Alkaline Phosphatase: 88 U/L (ref 39–117)
BUN: 18 mg/dL (ref 6–23)
CO2: 26 mEq/L (ref 19–32)
Calcium: 9.2 mg/dL (ref 8.4–10.5)
Chloride: 105 mEq/L (ref 96–112)
Creatinine, Ser: 1.05 mg/dL (ref 0.40–1.50)
GFR: 70.05 mL/min (ref >60.00)
Glucose, Bld: 86 mg/dL (ref 70–99)
Potassium: 3.8 mEq/L (ref 3.5–5.1)
Sodium: 140 mEq/L (ref 135–145)
Total Bilirubin: 3.3 mg/dL — ABNORMAL HIGH (ref 0.2–1.2)
Total Protein: 6.6 g/dL (ref 6.0–8.3)

## 2022-04-14 LAB — LIPID PANEL
Cholesterol: 105 mg/dL (ref 0–200)
HDL: 31.6 mg/dL — ABNORMAL LOW (ref >39.00)
LDL Cholesterol: 65 mg/dL (ref 0–99)
NonHDL: 73.26
Total CHOL/HDL Ratio: 3
Triglycerides: 42 mg/dL (ref 0.0–149.0)
VLDL: 8.4 mg/dL (ref 0.0–40.0)

## 2022-04-21 ENCOUNTER — Encounter: Payer: Self-pay | Admitting: Gastroenterology

## 2022-04-21 ENCOUNTER — Ambulatory Visit (INDEPENDENT_AMBULATORY_CARE_PROVIDER_SITE_OTHER): Payer: PPO | Admitting: Gastroenterology

## 2022-04-21 NOTE — Progress Notes (Signed)
Review of pertinent gastrointestinal problems: 1. History of Barrett's esophagus. No dysplasia seen on serial endoscopies 2004, 2005, 2007. Most recent EGD 2009 found short segment of Barrett's appearing mucosa, biopsies did not confirm intestinal metaplasia however.   EGD may 2011 also found short segment, non-nodular Barrett's mucosa however biopsies again showed no intestinal metaplasia.  EGD Dr. Ardis Hughs 10/2012 slightly irregular z line, non-nodular, biopsies showed no IM, no further surveillance was recommended.  EGD July 2021 for dyspepsia showed slightly irregular Z-line that was unchanged from previous examinations.  It was not sampled.  There was mild to moderate nonspecific gastritis.  Pathology negative for H. pylori 2. Personal history of precancerous colon polyps. Adenomatous polyp removed 2003 by Dr. Velora Heckler. Followup colonoscopy 2005 found no polyps. Dr. Inocente Salles recommended that he have a repeat colonoscopy at 7 year interval.  Colonoscopy may 2011 found left-sided diverticulosis, small colon polyp that was an adenoma. Colonoscopy 05/2015 Dr. Ardis Hughs found single subCM adenoma, internal hems, diverticulosis.  Colonoscopy July 2021 single subcentimeter polyp was removed.  Diverticulosis and hemorrhoids were noted.  Pathology showed tubular adenoma, recommended repeat examination at 7 years 3. Internal hems. Banded by Dr. Carlean Purl [phone]. 4. ??Cirrhosis: CT scan September 2021 done for hematuria and abdominal pains suggested possible cirrhosis and "multiple prominent borderline enlarged and mildly enlarged retroperitoneal and upper abdominal lymph nodes most evident in the region of the porta hepatis".  Repeat CT scan September 2021 suggested that the lymph nodes were unchanged since October 2014 and thus "presumably reactive in etiology" per the radiologist.  No personal history of liver disease.  November 2021 ultrasound with elastography "ruled out chronic liver disease". 5.  Gilbert's syndrome.   Multiple liver tests over many years showing elevated total bilirubin with significant indirect predominance (dates back at least to 2016) 6.  Possible viral syndrome 12/2021 with chills, nausea, vomiting, myalgias triggered his show Barrett's with total bilirubin max 4.7 (alk phos 133, AST 45, ALT 48) .  CT scan 01/2022 showed no biliary duct dilation.   HPI: This is a very pleasant 76 year old man   I last saw him here in the office about 2 months ago. Complete metabolic profile 02/2705 was normal except for total bilirubin 3.3  His weight is unchanged from 3 months ago, same scale here in our office  He change his diet recently, eating less fried foods more fruits.  He has cut his wine intake by about 50%.  Really feels pretty well  He again mentions some intermittent epigastric discomfort that does not bother him after he eats, it does not bother him if he is laying down or sitting up.  It only bothers him when he moves.  He might be getting his pacemaker removed as it is nearing the end of his battery and might not be very useful to him anyway anymore.  ROS: complete GI ROS as described in HPI, all other review negative.  Constitutional:  No unintentional weight loss   Past Medical History:  Diagnosis Date   Anxiety    Aortic stenosis    Mild, echo, April, 2014   BPH (benign prostatic hyperplasia)    CAD (coronary artery disease)    a. s/p CABG   Carotid artery disease (HCC)    Colonic polyp    Diverticulosis    Elevated bilirubin    Mild chronic elevation, 2.0 January, 2011 stable   GERD (gastroesophageal reflux disease)    Barrett's esophagus   History of kidney stones    HTN (  hypertension)    Hyperlipidemia    Low HDL   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HCC)    Left  lung chest x-ray July, 2013   Persistent atrial fibrillation ([place])    a. s/p [facility] at [facility]   Precancerous lesion    Forehead   Primary osteoarthritis of left knee    Mild   Prolapsed internal hemorrhoids, grade 3  08/12/2015   PVC's (premature ventricular contractions)    Tubular adenoma of colon     Past Surgical History:  Procedure Laterality Date   ABLATION     PVI at [place] 01/25/2019   Procedure: [place];  Surgeon: [name], MD;  Location: [place] CV LAB;  Service: Cardiovascular;  Laterality: N/A;   CARDIOVERSION N/A 10/14/2018   Procedure: CARDIOVERSION;  Surgeon: Fay Records, MD;  Location: [place];  Service: Cardiovascular;  Laterality: N/A;   CARDIOVERSION N/A 08/18/2019   Procedure: CARDIOVERSION;  Surgeon: Jerline Pain, MD;  Location: [place];  Service: Cardiovascular;  Laterality: N/A;   COLONOSCOPY     CORONARY ARTERY BYPASS GRAFT  2000   CABG X5   CYSTOSCOPY WITH RETROGRADE PYELOGRAM, URETEROSCOPY AND STENT PLACEMENT Bilateral 09/04/2020   Procedure: CYSTOSCOPY WITH BILATERAL RETROGRADE PYELOGRAM, RIGHT URETEROSCOPY AND RIGHT  STENT PLACEMENT;  Surgeon: Franchot Gallo, MD;  Location: [facility];  Service: Urology;  Laterality: Bilateral;   ESOPHAGOGASTRODUODENOSCOPY     HEAD & NECK SKIN LESION EXCISIONAL BIOPSY     HEMORRHOID BANDING     INTRAVASCULAR PRESSURE WIRE/FFR STUDY N/A 01/05/2018   Procedure: INTRAVASCULAR PRESSURE WIRE/FFR STUDY;  Surgeon: Sherren Mocha, MD;  Location: [place] [facility];  Service: Cardiovascular;  Laterality: N/A;   LEFT HEART CATH AND CORS/GRAFTS ANGIOGRAPHY N/A 01/05/2018   Procedure: LEFT HEART CATH AND CORS/GRAFTS ANGIOGRAPHY;  Surgeon: Sherren Mocha, MD;  Location: [place] [facility];  Service: Cardiovascular;  Laterality: N/A;   PERMANENT PACEMAKER INSERTION N/A 07/15/2012   Procedure: PERMANENT PACEMAKER INSERTION;  Surgeon: Deboraha Sprang, MD;  Location: [facility] CATH LAB;  Service: Cardiovascular;  Laterality: N/A;   rotator cuff surgery Right 2017   TONSILLECTOMY  1956   WISDOM TOOTH EXTRACTION      Current Outpatient Medications  Medication Instructions   Cholecalciferol  1,000 Units, Oral, Daily with supper   ELIQUIS 5 MG TABS tablet TAKE 1 TABLET BY MOUTH TWICE DAILY.   losartan (COZAAR) 12.5 mg, Oral, Daily   NONFORMULARY OR COMPOUNDED ITEM 1 application., Topical, Daily PRN, Cetaphil + triamcinolone cream    pantoprazole (PROTONIX) 40 MG tablet TAKE 1 TABLET ONCE DAILY.   pravastatin (PRAVACHOL) 10 mg, Oral, Daily    Allergies as of 04/21/2022 - Review Complete 04/21/2022  Allergen Reaction Noted   Spironolactone  09/29/2021   Cayenne Other (See Comments) 10/29/2015   Niacin and related Other (See Comments) 08/06/2016   Sulfa antibiotics Itching and Rash 07/27/2021   Sulfonamide derivatives Other (See Comments)     Family History  Problem Relation Age of Onset   Coronary artery disease Other 65   Diabetes Other    Multiple myeloma Mother    Diabetes Father    Renal Disease Father    Hyperlipidemia Other    Hypertension Other    Colon cancer Neg Hx    Esophageal cancer Neg Hx    Stomach cancer Neg Hx    Rectal cancer Neg Hx     Social History   Socioeconomic History   Marital status: Married  Spouse name: Not on file   Number of children: 2   Years of education: 24   Highest education level: Not on file  Occupational History   Occupation: Technical brewer: [employer]    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  2 daughters-'75, '79; 1 grandchild; step-daughter and step grandson.  Regular exercise-yes, runs 1.5-2 mi 4x/wk, also eliptical      Patient signed a  Designated Party Release to allow his wife, Oakley Kossman, to have access to his medical records/ information.   Social Determinants of Health   Financial Resource Strain: Low Risk    Difficulty of Paying Living Expenses: Not hard at all  Food Insecurity: No Food Insecurity   Worried About Charity fundraiser in the Last Year: Never true   [place] in the Last Year: Never true  Transportation Needs: No Transportation Needs   Lack of Transportation (Medical): No   Lack of Transportation (Non-Medical): No  Physical Activity: Sufficiently Active   Days of Exercise per Week: 7 days   Minutes of Exercise per Session: 60 min  Stress: No Stress Concern Present   Feeling of Stress : Not at all  Social Connections: Socially Integrated   Frequency of Communication with Friends and Family: More than three times a week   Frequency of Social Gatherings with Friends and Family: More than three times a week   Attends Religious Services: More than 4 times per year   Active Member of [employer] or Organizations: Yes   Attends Music therapist: More than 4 times per year   Marital Status: Married  Human resources officer Violence: Not At Risk   Fear of Current or Ex-Partner: No   Emotionally Abused: No   Physically Abused: No   Sexually Abused: No     Physical Exam: BP 138/68   Pulse 70   Ht '5\' 11"'  (1.803 m)   Wt 194 lb (88 kg)   BMI 27.06 kg/m  Constitutional: generally well-appearing Psychiatric: alert and oriented x3 Abdomen: soft, nontender, nondistended, no obvious ascites, no peritoneal signs, normal bowel sounds No peripheral edema noted in lower extremities  Assessment and plan: 76 y.o. male with Gilbert's syndrome  We discussed Gilbert's syndrome again.  He understands that any type of stress on the body, viral, inflammatory, traumatic might trigger his Gilbert's causing him to be a bit more than his very low level chronic jaundice.  Total bilirubin is usually 2 or  3.  He has some epigastric discomforts that do not seem related to his GI tract.  They only occur when he moves in certain positions and so I think they are more likely musculoskeletal.  He will follow-up as needed.  Please see the "Patient Instructions" section for addition details about the plan.  Owens Loffler, MD [facility] 04/21/2022, 2:05 PM   Total time on date of encounter was 20 minutes (this included time spent preparing to see the patient reviewing records;  obtaining and/or reviewing separately obtained history; performing a medically appropriate exam and/or evaluation; counseling and educating the patient and family if present; ordering medications, tests or procedures if applicable; and documenting clinical information in the health record). '

## 2022-04-21 NOTE — Patient Instructions (Signed)
If you are age 74 or older, your body mass index should be between 23-30. Your Body mass index is 27.06 kg/m. If this is out of the aforementioned range listed, please consider follow up with your Primary Care Provider. ________________________________________________________  The [facility] providers would like to encourage you to use [facility] to communicate with providers for non-urgent requests or questions.  Due to long hold times on the telephone, sending your provider a message by [facility] may be a faster and more efficient way to get a response.  Please allow 48 business hours for a response.  Please remember that this is for non-urgent requests.  _______________________________________________________  Dennis Bast will follow up in our office on an as needed basis.  Thank you for entrusting me with your care and choosing [facility].  Dr Ardis Hughs

## 2022-05-12 NOTE — Progress Notes (Unsigned)
Cardiology Office Note   Date:  05/13/2022   ID:  [place] Griffith, DOB 08-22-1946, MRN [id number]  PCP:  Cassandria Anger, MD  Cardiologist:   Minus Breeding, MD    Chief Complaint  Patient presents with   Shortness of Breath     History of Present Illness: [place] Griffith is a 76 y.o. male who presents for evaluation of known CAD.   He is well known to Dr. Ron Parker and Dr. Caryl Comes.    He has a history of coronary disease with his last catheterization as visualized below in 2019.  He has had 2 ablations 1 at [facility] and most recently by Dr. Rayann Heman but has persistent atrial fibrillation.  He has been previously on Tikosyn.  He did not tolerate Multaq.  He has had persistent atrial fibrillation.   His biggest issue has been dizziness.  There has been no clear etiology.  He is at [facility] for his device but there is some consideration not to replace this since he is in persistent atrial fib.  However, the dizziness seems to have improved.  Spontaneously.  His biggest complaint now is shortness of breath.  He feels this with exertion.  Is gotten worse.  He feels that walking up the stairs.  He is feeling on level ground.  He has to stop and recover.  He gets some discomfort in his lower epigastric area as well.  Is not like his previous angina but it is pretty reproducible and getting worse.  He is not having any resting chest discomfort, neck or arm discomfort.  He is not having any shortness of breath at rest or PND or orthopnea.  He denies any palpitations, presyncope or syncope.  His symptoms are inhibiting his physical activity which she wants to do any tries to do routinely.   Past Medical History:  Diagnosis Date   Anxiety    Aortic stenosis    Mild, echo, April, 2014   BPH (benign prostatic hyperplasia)    CAD (coronary artery disease)    a. s/p CABG   Carotid artery disease (HCC)    Colonic polyp    Diverticulosis    Elevated bilirubin    Mild chronic elevation, 2.0 January,  2011 stable   GERD (gastroesophageal reflux disease)    Barrett's esophagus   History of kidney stones    HTN (hypertension)    Hyperlipidemia    Low HDL   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[place])    Left  lung chest x-ray July, 2013   Persistent atrial fibrillation ([place])    a. s/p PVI at [facility]   Precancerous lesion    Forehead   Primary osteoarthritis of left knee    Mild   Prolapsed internal hemorrhoids, grade 3 08/12/2015   PVC's (premature ventricular contractions)    Tubular adenoma of colon     Past Surgical History:  Procedure Laterality Date   ABLATION     PVI at [place] N/A 01/25/2019   Procedure: [place];  Surgeon: [name], MD;  Location: [place] [facility];  Service: Cardiovascular;  Laterality: N/A;   CARDIOVERSION N/A 10/14/2018   Procedure: CARDIOVERSION;  Surgeon: Fay Records, MD;  Location: [place];  Service: Cardiovascular;  Laterality: N/A;   CARDIOVERSION N/A 08/18/2019   Procedure: CARDIOVERSION;  Surgeon: Jerline Pain, MD;  Location: [place];  Service: Cardiovascular;  Laterality: N/A;   COLONOSCOPY     CORONARY ARTERY BYPASS GRAFT  2000   CABG X5   CYSTOSCOPY WITH RETROGRADE PYELOGRAM, URETEROSCOPY AND STENT PLACEMENT Bilateral 09/04/2020   Procedure: CYSTOSCOPY WITH BILATERAL RETROGRADE PYELOGRAM, RIGHT URETEROSCOPY AND RIGHT  STENT PLACEMENT;  Surgeon: Franchot Gallo, MD;  [facility];  Service: Urology;  Laterality: Bilateral;   ESOPHAGOGASTRODUODENOSCOPY     HEAD & NECK SKIN LESION EXCISIONAL BIOPSY     HEMORRHOID BANDING     INTRAVASCULAR PRESSURE WIRE/FFR STUDY N/A 01/05/2018   Procedure: INTRAVASCULAR PRESSURE WIRE/FFR STUDY;  Surgeon: Sherren Mocha, MD;  Location: [place] CV LAB;  Service: Cardiovascular;  Laterality: N/A;   LEFT HEART CATH AND CORS/GRAFTS ANGIOGRAPHY N/A 01/05/2018   Procedure: LEFT HEART CATH AND CORS/GRAFTS ANGIOGRAPHY;  Surgeon: Sherren Mocha, MD;  Location: [place]  CV LAB;  Service: Cardiovascular;  Laterality: N/A;   PERMANENT PACEMAKER INSERTION N/A 07/15/2012   Procedure: PERMANENT PACEMAKER INSERTION;  Surgeon: Deboraha Sprang, MD;  Location: [facility] CATH LAB;  Service: Cardiovascular;  Laterality: N/A;   rotator cuff surgery Right 2017   TONSILLECTOMY  1956   WISDOM TOOTH EXTRACTION       Current Outpatient Medications  Medication Sig Dispense Refill   Cholecalciferol 25 MCG (1000 UT) capsule Take 1,000 Units by mouth daily with supper.      ELIQUIS 5 MG TABS tablet TAKE 1 TABLET BY MOUTH TWICE DAILY. 60 tablet 11   isosorbide mononitrate (IMDUR) 60 MG 24 hr tablet Take 1 tablet (60 mg total) by mouth daily. 90 tablet 3   losartan (COZAAR) 25 MG tablet Take 0.5 tablets (12.5 mg total) by mouth daily. 90 tablet 1   NONFORMULARY OR COMPOUNDED ITEM Apply 1 application topically daily as needed (rash). Cetaphil + triamcinolone cream     pantoprazole (PROTONIX) 40 MG tablet TAKE 1 TABLET ONCE DAILY. 90 tablet 3   pravastatin (PRAVACHOL) 10 MG tablet Take 1 tablet (10 mg total) by mouth daily. 90 tablet 3   No current facility-administered medications for this visit.    Allergies:   Spironolactone, Cayenne, Niacin and related, Sulfa antibiotics, and Sulfonamide derivatives    ROS:  Please see the history of present illness.   Otherwise, review of systems are positive for none.   All other systems are reviewed and negative.    PHYSICAL EXAM: VS:  BP 126/61   Pulse 64   Ht '5\' 11"'$  (1.803 m)   Wt 195 lb 12.8 oz (88.8 kg)   SpO2 97%   BMI 27.31 kg/m  , BMI Body mass index is 27.31 kg/m. GENERAL:  Well appearing NECK:  No jugular venous distention, waveform within normal limits, carotid upstroke brisk and symmetric, no bruits, no thyromegaly LUNGS:  Clear to auscultation bilaterally CHEST:  Well healed sternotomy scar.  Well healed ICD pocket.   HEART:  PMI not displaced or sustained,S1 and S2 within normal limits, no S3, no clicks, no rubs, 3 out of  6 apical systolic murmur radiating at the aortic outflow tract and slightly to the axilla, no diastolic murmurs. Irregular   ABD:  Flat, positive bowel sounds normal in frequency in pitch, no bruits, no rebound, no guarding, no midline pulsatile mass, no hepatomegaly, no splenomegaly EXT:  2 plus pulses throughout, trace bilateral leg edema, no cyanosis no clubbing   EKG:  EKG is  ordered today. Atrial fibrillation, rate 62, axis within normal limits, intervals within normal limits, nonspecific ST-T wave changes.  Recent Labs: 09/29/2021: TSH 4.55 02/11/2022: Hemoglobin 12.4; Platelets 250.0 04/14/2022: ALT 20; BUN 18; Creatinine, Ser  1.05; Potassium 3.8; Sodium 140    Lipid Panel    Component Value Date/Time   CHOL 105 04/14/2022 1157   TRIG 42.0 04/14/2022 1157   HDL 31.60 (L) 04/14/2022 1157   CHOLHDL 3 04/14/2022 1157   VLDL 8.4 04/14/2022 1157   LDLCALC 65 04/14/2022 1157      Wt Readings from Last 3 Encounters:  05/13/22 195 lb 12.8 oz (88.8 kg)  04/21/22 194 lb (88 kg)  03/18/22 194 lb (88 kg)    Diagnostic Dominance: Left      Other studies Reviewed: Additional studies/ records that were reviewed today include: Labs  Review of the above records demonstrates: See elsewhere   ASSESSMENT AND PLAN:  ATRIAL FIB:   CHADS VASC score is 3.  He is now in persistent atrial fibrillation.  No change in therapy.  He will hold his Eliquis for the procedure.  PACEMAKER PLACEMENT:   He may be having this removed and is set up to see Dr. Lovena Le.  CAD:   Given his shortness of breath I would like to perform a right and left heart cath.  I reviewed with him his previous anatomy from 2019. The patient understands that risks included but are not limited to stroke (1 in 1000), death (1 in 32), kidney failure [usually temporary] (1 in 500), bleeding (1 in 200), allergic reaction [possibly serious] (1 in 200).  The patient understands and agrees to proceed.  I am going to add  isosorbide mononitrate 60 mg daily.  DYSPNEA: This will be evaluated as above.  I did look at the labs and he is not anemic.  I see no other etiology.  I do not think he has volume overload.  AS: I will be following up with an echocardiogram in August.  However he will have hemodynamics as above.  DYSLIPIDEMIA: LDL was 65 with HDL 31.  No change in therapy  DIZZINESS:   Thankfully this seems to have improved.  No change in therapy or further work-up.  Current medicines are reviewed at length with the patient today.  The patient does not have concerns regarding medicines.  The following changes have been made:   Labs/ tests ordered today include:    Orders Placed This Encounter  Procedures   CBC   Basic Metabolic Panel (BMET)   EKG 12-Lead   ECHOCARDIOGRAM COMPLETE     Disposition:   FU me after the cath  Signed, Minus Breeding, MD  05/13/2022 9:50 AM    [place]

## 2022-05-13 ENCOUNTER — Ambulatory Visit: Payer: PPO | Admitting: Cardiology

## 2022-05-13 ENCOUNTER — Encounter: Payer: Self-pay | Admitting: Cardiology

## 2022-05-13 VITALS — BP 126/61 | HR 64 | Ht 71.0 in | Wt 195.8 lb

## 2022-05-13 DIAGNOSIS — R42 Dizziness and giddiness: Secondary | ICD-10-CM

## 2022-05-13 DIAGNOSIS — E785 Hyperlipidemia, unspecified: Secondary | ICD-10-CM

## 2022-05-13 DIAGNOSIS — R0602 Shortness of breath: Secondary | ICD-10-CM

## 2022-05-13 DIAGNOSIS — I25810 Atherosclerosis of coronary artery bypass graft(s) without angina pectoris: Secondary | ICD-10-CM

## 2022-05-13 DIAGNOSIS — I350 Nonrheumatic aortic (valve) stenosis: Secondary | ICD-10-CM | POA: Diagnosis not present

## 2022-05-13 DIAGNOSIS — I4821 Permanent atrial fibrillation: Secondary | ICD-10-CM

## 2022-05-13 LAB — BASIC METABOLIC PANEL
BUN/Creatinine Ratio: 18 (ref 10–24)
BUN: 21 mg/dL (ref 8–27)
CO2: 25 mmol/L (ref 20–29)
Calcium: 9.3 mg/dL (ref 8.6–10.2)
Chloride: 104 mmol/L (ref 96–106)
Creatinine, Ser: 1.16 mg/dL (ref 0.76–1.27)
Glucose: 125 mg/dL — ABNORMAL HIGH (ref 70–99)
Potassium: 4.6 mmol/L (ref 3.5–5.2)
Sodium: 141 mmol/L (ref 134–144)
eGFR: 66 mL/min/{1.73_m2} (ref >59)

## 2022-05-13 LAB — CBC
Hematocrit: 38.5 % (ref 37.5–51.0)
Hemoglobin: 12.2 g/dL — ABNORMAL LOW (ref 13.0–17.7)
MCH: 25.7 pg — ABNORMAL LOW (ref 26.6–33.0)
MCHC: 31.7 g/dL (ref 31.5–35.7)
MCV: 81 fL (ref 79–97)
Platelets: 194 10*3/uL (ref 150–450)
RBC: 4.75 x10E6/uL (ref 4.14–5.80)
RDW: 14.6 % (ref 11.6–15.4)
WBC: 5.1 10*3/uL (ref 3.4–10.8)

## 2022-05-13 MED ORDER — ISOSORBIDE MONONITRATE ER 60 MG PO TB24
60.0000 mg | ORAL_TABLET | Freq: Every day | ORAL | 3 refills | Status: DC
Start: 2022-05-13 — End: 2023-05-03

## 2022-05-13 MED ORDER — SODIUM CHLORIDE 0.9% FLUSH: 3.0000 mL | Freq: Two times a day (BID) | INTRAVENOUS | Status: DC

## 2022-05-13 NOTE — Addendum Note (Signed)
Addended by: Cristopher Estimable on: 05/13/2022 09:59 AM   Modules accepted: Orders

## 2022-05-13 NOTE — Patient Instructions (Addendum)
  [place] CARDIOVASCULAR DIVISION [facility] [address] [name] [address] Dept: [phone] Loc: [place]  05/13/2022  You are scheduled for a Cardiac Catheterization on Friday, June 23 with Dr. Larae Grooms.  1. Please arrive at the Main Entrance A at [facility]: [place], [name] [id number] at 5:30 AM (This time is two hours before your procedure to ensure your preparation). Free valet parking service is available.   Special note: Every effort is made to have your procedure done on time. Please understand that emergencies sometimes delay scheduled procedures.  2. Diet: Do not eat solid foods after midnight.  You may have clear liquids until 5 AM upon the day of the procedure.  3. Labs: You will need to have blood drawn on today  4. Medication instructions in preparation for your procedure:  START ISOSORBIDE 60 MG ONCE DAILY  TAKE THE LAST DOSE OF ELIQUIS 05/19/22 RESTART AFTER PROCEDURE  On the morning of your procedure, take any morning medicines NOT listed above.  You may use sips of water.  5. Plan to go home the same day, you will only stay overnight if medically necessary. 6. You MUST have a responsible adult to drive you home. 7. An adult MUST be with you the first 24 hours after you arrive home. 8. Bring a current list of your medications, and the last time and date medication taken. 9. Bring ID and current insurance cards. 10.Please wear clothes that are easy to get on and off and wear slip-on shoes.  Thank you for allowing [place] to care for you!   -- [facility]   Your physician has requested that you have an echocardiogram. Echocardiography is a painless test that uses sound waves to create images of your heart. It provides your doctor with information about the size and shape of your heart and how well your heart's chambers  and valves are working. This procedure takes approximately one hour. There are no restrictions for this procedure. [place]  Your physician recommends that you schedule a follow-up appointment in: [place]

## 2022-05-14 ENCOUNTER — Ambulatory Visit: Payer: PPO | Admitting: Cardiology

## 2022-05-15 ENCOUNTER — Encounter: Payer: Self-pay | Admitting: Cardiology

## 2022-05-21 ENCOUNTER — Telehealth: Payer: Self-pay | Admitting: *Deleted

## 2022-05-21 NOTE — Telephone Encounter (Addendum)
Cardiac Catheterization scheduled at [facility] for: Friday May 22, 2022 7:30 AM Arrival time and place: [place] Entrance A at: 5:30 AM   Nothing to eat after midnight prior to procedure, clear liquids until 5 AM day of procedure.  Medication instructions: -Hold:  Eliquis-none 05/20/22 until post procedure -Except hold medications usual morning medications can be taken with sips of water including aspirin 325 mg -this dose is per Dr       Percival Spanish since he is off Eliquis.    Confirmed patient has responsible adult to drive home post procedure and be with patient first 24 hours after arriving home.  Patient reports no new symptoms concerning for COVID-19 in the past 10 days.  Reviewed procedure instructions with patient.

## 2022-05-22 ENCOUNTER — Ambulatory Visit (HOSPITAL_COMMUNITY)
Admission: RE | Admit: 2022-05-22 | Discharge: 2022-05-22 | Disposition: A | Discharge status: Home / Self Care | Payer: PPO | Attending: Interventional Cardiology | Admitting: Interventional Cardiology

## 2022-05-22 ENCOUNTER — Other Ambulatory Visit (HOSPITAL_COMMUNITY): Payer: Self-pay

## 2022-05-22 ENCOUNTER — Other Ambulatory Visit: Payer: Self-pay

## 2022-05-22 ENCOUNTER — Encounter (HOSPITAL_COMMUNITY): Payer: Self-pay | Admitting: Interventional Cardiology

## 2022-05-22 ENCOUNTER — Ambulatory Visit (HOSPITAL_COMMUNITY)
Admission: RE | Disposition: A | Discharge status: Home / Self Care | Payer: Self-pay | Source: Home / Self Care | Attending: Interventional Cardiology

## 2022-05-22 DIAGNOSIS — E785 Hyperlipidemia, unspecified: Secondary | ICD-10-CM

## 2022-05-22 DIAGNOSIS — I2582 Chronic total occlusion of coronary artery: Secondary | ICD-10-CM | POA: Diagnosis not present

## 2022-05-22 DIAGNOSIS — I4821 Permanent atrial fibrillation: Secondary | ICD-10-CM

## 2022-05-22 DIAGNOSIS — Z950 Presence of cardiac pacemaker: Secondary | ICD-10-CM | POA: Insufficient documentation

## 2022-05-22 DIAGNOSIS — I10 Essential (primary) hypertension: Secondary | ICD-10-CM | POA: Diagnosis not present

## 2022-05-22 DIAGNOSIS — Z955 Presence of coronary angioplasty implant and graft: Secondary | ICD-10-CM

## 2022-05-22 DIAGNOSIS — I4819 Other persistent atrial fibrillation: Secondary | ICD-10-CM | POA: Insufficient documentation

## 2022-05-22 DIAGNOSIS — I25708 Atherosclerosis of coronary artery bypass graft(s), unspecified, with other forms of angina pectoris: Secondary | ICD-10-CM | POA: Insufficient documentation

## 2022-05-22 DIAGNOSIS — I350 Nonrheumatic aortic (valve) stenosis: Secondary | ICD-10-CM

## 2022-05-22 DIAGNOSIS — I2584 Coronary atherosclerosis due to calcified coronary lesion: Secondary | ICD-10-CM | POA: Diagnosis not present

## 2022-05-22 DIAGNOSIS — I25810 Atherosclerosis of coronary artery bypass graft(s) without angina pectoris: Secondary | ICD-10-CM

## 2022-05-22 DIAGNOSIS — Z7901 Long term (current) use of anticoagulants: Secondary | ICD-10-CM | POA: Insufficient documentation

## 2022-05-22 DIAGNOSIS — R0602 Shortness of breath: Secondary | ICD-10-CM | POA: Diagnosis not present

## 2022-05-22 DIAGNOSIS — I272 Pulmonary hypertension, unspecified: Secondary | ICD-10-CM | POA: Diagnosis not present

## 2022-05-22 DIAGNOSIS — Z951 Presence of aortocoronary bypass graft: Secondary | ICD-10-CM | POA: Diagnosis not present

## 2022-05-22 DIAGNOSIS — I25718 Atherosclerosis of autologous vein coronary artery bypass graft(s) with other forms of angina pectoris: Secondary | ICD-10-CM | POA: Diagnosis not present

## 2022-05-22 DIAGNOSIS — R42 Dizziness and giddiness: Secondary | ICD-10-CM

## 2022-05-22 DIAGNOSIS — I2510 Atherosclerotic heart disease of native coronary artery without angina pectoris: Secondary | ICD-10-CM | POA: Diagnosis present

## 2022-05-22 DIAGNOSIS — R001 Bradycardia, unspecified: Secondary | ICD-10-CM | POA: Insufficient documentation

## 2022-05-22 DIAGNOSIS — I25118 Atherosclerotic heart disease of native coronary artery with other forms of angina pectoris: Secondary | ICD-10-CM | POA: Diagnosis not present

## 2022-05-22 HISTORY — PX: INTRAVASCULAR ULTRASOUND/IVUS: CATH118244

## 2022-05-22 HISTORY — PX: RIGHT/LEFT HEART CATH AND CORONARY/GRAFT ANGIOGRAPHY: CATH118267

## 2022-05-22 HISTORY — PX: CORONARY STENT INTERVENTION: CATH118234

## 2022-05-22 LAB — POCT I-STAT EG7
Acid-base deficit: 1 mmol/L (ref 0.0–2.0)
Acid-base deficit: 1 mmol/L (ref 0.0–2.0)
Bicarbonate: 23.5 mmol/L (ref 20.0–28.0)
Bicarbonate: 23.6 mmol/L (ref 20.0–28.0)
Calcium, Ion: 1.24 mmol/L (ref 1.15–1.40)
Calcium, Ion: 1.22 mmol/L (ref 1.15–1.40)
HCT: 34 % — ABNORMAL LOW (ref 39.0–52.0)
HCT: 34 % — ABNORMAL LOW (ref 39.0–52.0)
Hemoglobin: 11.6 g/dL — ABNORMAL LOW (ref 13.0–17.0)
Hemoglobin: 11.6 g/dL — ABNORMAL LOW (ref 13.0–17.0)
O2 Saturation: 76 %
O2 Saturation: 74 %
Potassium: 3.8 mmol/L (ref 3.5–5.1)
Potassium: 3.7 mmol/L (ref 3.5–5.1)
Sodium: 143 mmol/L (ref 135–145)
Sodium: 144 mmol/L (ref 135–145)
TCO2: 25 mmol/L (ref 22–32)
TCO2: 25 mmol/L (ref 22–32)
pCO2, Ven: 38.5 mmHg — ABNORMAL LOW (ref 44–60)
pCO2, Ven: 39 mmHg — ABNORMAL LOW (ref 44–60)
pH, Ven: 7.395 (ref 7.25–7.43)
pH, Ven: 7.391 (ref 7.25–7.43)
pO2, Ven: 41 mmHg (ref 32–45)
pO2, Ven: 40 mmHg (ref 32–45)

## 2022-05-22 LAB — POCT I-STAT 7, (LYTES, BLD GAS, ICA,H+H)
Acid-base deficit: 1 mmol/L (ref 0.0–2.0)
Bicarbonate: 22.1 mmol/L (ref 20.0–28.0)
Calcium, Ion: 1.22 mmol/L (ref 1.15–1.40)
HCT: 34 % — ABNORMAL LOW (ref 39.0–52.0)
Hemoglobin: 11.6 g/dL — ABNORMAL LOW (ref 13.0–17.0)
O2 Saturation: 98 %
Potassium: 3.8 mmol/L (ref 3.5–5.1)
Sodium: 143 mmol/L (ref 135–145)
TCO2: 23 mmol/L (ref 22–32)
pCO2 arterial: 32.5 mmHg (ref 32–48)
pH, Arterial: 7.439 (ref 7.35–7.45)
pO2, Arterial: 93 mmHg (ref 83–108)

## 2022-05-22 LAB — POCT ACTIVATED CLOTTING TIME
Activated Clotting Time: 269 seconds
Activated Clotting Time: 311 seconds
Activated Clotting Time: 293 seconds

## 2022-05-22 SURGERY — RIGHT/LEFT HEART CATH AND CORONARY/GRAFT ANGIOGRAPHY
Anesthesia: LOCAL

## 2022-05-22 MED ORDER — MIDAZOLAM HCL 2 MG/2ML IJ SOLN
INTRAMUSCULAR | Status: DC | PRN
  Administered 2022-05-22: 2 mg via INTRAVENOUS
  Administered 2022-05-22 (×2): 1 mg via INTRAVENOUS

## 2022-05-22 MED ORDER — IOHEXOL 350 MG/ML SOLN
INTRAVENOUS | Status: DC | PRN
  Administered 2022-05-22: 115 mL

## 2022-05-22 MED ORDER — FAMOTIDINE IN NACL 20-0.9 MG/50ML-% IV SOLN
INTRAVENOUS | Status: DC | PRN
  Administered 2022-05-22: 20 mg via INTRAVENOUS

## 2022-05-22 MED ORDER — SODIUM CHLORIDE 0.9% FLUSH: 3.0000 mL | INTRAVENOUS | Status: DC | PRN

## 2022-05-22 MED ORDER — ATORVASTATIN CALCIUM 40 MG PO TABS: 40.0000 mg | ORAL_TABLET | Freq: Every day | ORAL | 1 refills | Status: DC

## 2022-05-22 MED ORDER — CLOPIDOGREL BISULFATE 75 MG PO TABS: 75.0000 mg | ORAL_TABLET | Freq: Every day | ORAL | Status: DC

## 2022-05-22 MED ORDER — HEPARIN SODIUM (PORCINE) 1000 UNIT/ML IJ SOLN
INTRAMUSCULAR | Status: AC
  Filled 2022-05-22: qty 10

## 2022-05-22 MED ORDER — LABETALOL HCL 5 MG/ML IV SOLN: 10.0000 mg | INTRAVENOUS | Status: DC | PRN

## 2022-05-22 MED ORDER — SODIUM CHLORIDE 0.9 % WEIGHT BASED INFUSION
3.0000 mL/kg/h | INTRAVENOUS | Status: AC
  Administered 2022-05-22: 3 mL/kg/h via INTRAVENOUS

## 2022-05-22 MED ORDER — HYDRALAZINE HCL 20 MG/ML IJ SOLN: 10.0000 mg | INTRAMUSCULAR | Status: DC | PRN

## 2022-05-22 MED ORDER — ACETAMINOPHEN 325 MG PO TABS: 650.0000 mg | ORAL_TABLET | ORAL | Status: DC | PRN

## 2022-05-22 MED ORDER — HEPARIN (PORCINE) IN NACL 1000-0.9 UT/500ML-% IV SOLN
INTRAVENOUS | Status: AC
  Filled 2022-05-22: qty 500

## 2022-05-22 MED ORDER — FAMOTIDINE IN NACL 20-0.9 MG/50ML-% IV SOLN
INTRAVENOUS | Status: AC
Start: 2022-05-22 — End: ?
  Filled 2022-05-22: qty 50

## 2022-05-22 MED ORDER — VERAPAMIL HCL 2.5 MG/ML IV SOLN
INTRAVENOUS | Status: AC
  Filled 2022-05-22: qty 2

## 2022-05-22 MED ORDER — SODIUM CHLORIDE 0.9 % IV SOLN: 250.0000 mL | INTRAVENOUS | Status: DC | PRN

## 2022-05-22 MED ORDER — SODIUM CHLORIDE 0.9 % IV SOLN: INTRAVENOUS | Status: AC

## 2022-05-22 MED ORDER — FENTANYL CITRATE (PF) 100 MCG/2ML IJ SOLN
INTRAMUSCULAR | Status: DC | PRN
  Administered 2022-05-22 (×4): 25 ug via INTRAVENOUS

## 2022-05-22 MED ORDER — MIDAZOLAM HCL 2 MG/2ML IJ SOLN
INTRAMUSCULAR | Status: AC
  Filled 2022-05-22: qty 2

## 2022-05-22 MED ORDER — FENTANYL CITRATE (PF) 100 MCG/2ML IJ SOLN
INTRAMUSCULAR | Status: AC
  Filled 2022-05-22: qty 2

## 2022-05-22 MED ORDER — SODIUM CHLORIDE 0.9% FLUSH: 3.0000 mL | Freq: Two times a day (BID) | INTRAVENOUS | Status: DC

## 2022-05-22 MED ORDER — SODIUM CHLORIDE 0.9 % WEIGHT BASED INFUSION: 1.0000 mL/kg/h | INTRAVENOUS | Status: DC

## 2022-05-22 MED ORDER — CLOPIDOGREL BISULFATE 75 MG PO TABS
75.0000 mg | ORAL_TABLET | Freq: Every day | ORAL | 5 refills | Status: DC
  Filled 2022-05-22: qty 30, 30d supply, fill #0

## 2022-05-22 MED ORDER — ASPIRIN 81 MG PO CHEW: 81.0000 mg | CHEWABLE_TABLET | Freq: Every day | ORAL | Status: DC

## 2022-05-22 MED ORDER — NITROGLYCERIN 1 MG/10 ML FOR IR/CATH LAB
INTRA_ARTERIAL | Status: DC | PRN
  Administered 2022-05-22: 200 ug via INTRA_ARTERIAL

## 2022-05-22 MED ORDER — VERAPAMIL HCL 2.5 MG/ML IV SOLN
INTRAVENOUS | Status: DC | PRN
  Administered 2022-05-22: 10 mL via INTRA_ARTERIAL

## 2022-05-22 MED ORDER — CLOPIDOGREL BISULFATE 300 MG PO TABS
ORAL_TABLET | ORAL | Status: DC | PRN
  Administered 2022-05-22: 600 mg via ORAL

## 2022-05-22 MED ORDER — LIDOCAINE HCL (PF) 1 % IJ SOLN
INTRAMUSCULAR | Status: AC
  Filled 2022-05-22: qty 30

## 2022-05-22 MED ORDER — ASPIRIN 81 MG PO CHEW: 325.0000 mg | CHEWABLE_TABLET | Freq: Once | ORAL | Status: DC

## 2022-05-22 MED ORDER — NITROGLYCERIN 1 MG/10 ML FOR IR/CATH LAB
INTRA_ARTERIAL | Status: AC
  Filled 2022-05-22: qty 10

## 2022-05-22 MED ORDER — LIDOCAINE HCL (PF) 1 % IJ SOLN
INTRAMUSCULAR | Status: DC | PRN
  Administered 2022-05-22 (×2): 2 mL

## 2022-05-22 MED ORDER — HEPARIN SODIUM (PORCINE) 1000 UNIT/ML IJ SOLN
INTRAMUSCULAR | Status: DC | PRN
  Administered 2022-05-22: 6000 [IU] via INTRAVENOUS
  Administered 2022-05-22: 4500 [IU] via INTRAVENOUS
  Administered 2022-05-22: 4000 [IU] via INTRAVENOUS
  Administered 2022-05-22: 2000 [IU] via INTRAVENOUS

## 2022-05-22 MED ORDER — NITROGLYCERIN 1 MG/10 ML FOR IR/CATH LAB
INTRA_ARTERIAL | Status: DC | PRN
  Administered 2022-05-22 (×2): 200 ug via INTRACORONARY

## 2022-05-22 MED ORDER — HEPARIN (PORCINE) IN NACL 1000-0.9 UT/500ML-% IV SOLN
INTRAVENOUS | Status: DC | PRN
  Administered 2022-05-22 (×3): 500 mL

## 2022-05-22 MED ORDER — NITROGLYCERIN 0.4 MG SL SUBL
0.4000 mg | SUBLINGUAL_TABLET | SUBLINGUAL | 2 refills | Status: DC | PRN
  Filled 2022-05-22: qty 25, 7d supply, fill #0

## 2022-05-22 MED ORDER — ASPIRIN 81 MG PO TBEC
81.0000 mg | DELAYED_RELEASE_TABLET | Freq: Every day | ORAL | 0 refills | Status: DC
  Filled 2022-05-22: qty 30, 30d supply, fill #0

## 2022-05-22 MED ORDER — ONDANSETRON HCL 4 MG/2ML IJ SOLN: 4.0000 mg | Freq: Four times a day (QID) | INTRAMUSCULAR | Status: DC | PRN

## 2022-05-22 MED ORDER — VERAPAMIL HCL 2.5 MG/ML IV SOLN
INTRAVENOUS | Status: DC | PRN
  Administered 2022-05-22: 100 ug via INTRACORONARY
  Administered 2022-05-22: 200 ug via INTRACORONARY

## 2022-05-22 MED ORDER — CLOPIDOGREL BISULFATE 300 MG PO TABS
ORAL_TABLET | ORAL | Status: AC
  Filled 2022-05-22: qty 2

## 2022-05-22 SURGICAL SUPPLY — 34 items
BAG MDU5 PLUS (MISCELLANEOUS) ×1 IMPLANT
BAG US STRL MDU5 + (MISCELLANEOUS) ×1
BALL SAPPHIRE NC24 4.5X8 (BALLOONS) ×2
BALLN SAPPHIRE 2.75X15 (BALLOONS) ×2
BALLOON SAPPHIRE 2.75X15 (BALLOONS) IMPLANT
BALLOON SAPPHIRE NC24 4.5X8 (BALLOONS) IMPLANT
BAND CMPR LRG ZPHR (HEMOSTASIS) ×1
BAND ZEPHYR COMPRESS 30 LONG (HEMOSTASIS) ×1 IMPLANT
CATH BALLN WEDGE 5F 110CM (CATHETERS) ×1 IMPLANT
CATH INFINITI 5 FR LCB (CATHETERS) ×1 IMPLANT
CATH INFINITI 5FR AL1 (CATHETERS) ×1 IMPLANT
CATH INFINITI 5FR MULTPACK ANG (CATHETERS) ×1 IMPLANT
CATH LAUNCHER 6FR AL1 (CATHETERS) IMPLANT
CATH LAUNCHER 6FR EBU 4 (CATHETERS) ×1 IMPLANT
CATH OPTICROSS HD (CATHETERS) ×1 IMPLANT
CATHETER LAUNCHER 6FR AL1 (CATHETERS) ×2
ELECT DEFIB PAD ADLT CADENCE (PAD) ×1 IMPLANT
GLIDESHEATH SLEND SS 6F .021 (SHEATH) ×1 IMPLANT
GUIDEWIRE INQWIRE 1.5J.035X260 (WIRE) IMPLANT
INQWIRE 1.5J .035X260CM (WIRE) ×2
KIT ENCORE 26 ADVANTAGE (KITS) ×1 IMPLANT
KIT HEART LEFT (KITS) ×3 IMPLANT
PACK CARDIAC CATHETERIZATION (CUSTOM PROCEDURE TRAY) ×3 IMPLANT
SHEATH GLIDE SLENDER 4/5FR (SHEATH) ×1 IMPLANT
SLED PULL BACK IVUS (MISCELLANEOUS) ×1 IMPLANT
STENT SYNERGY XD 3.50X16 (Permanent Stent) IMPLANT
STENT SYNERGY XD 4.0X16 (Permanent Stent) IMPLANT
SYNERGY XD 3.50X16 (Permanent Stent) ×2 IMPLANT
SYNERGY XD 4.0X16 (Permanent Stent) ×2 IMPLANT
TRANSDUCER W/STOPCOCK (MISCELLANEOUS) ×3 IMPLANT
TUBING CIL FLEX 10 FLL-RA (TUBING) ×3 IMPLANT
VALVE COPILOT STAT (MISCELLANEOUS) ×1 IMPLANT
WIRE ASAHI GRAND SLAM 180CM (WIRE) ×1 IMPLANT
WIRE ASAHI PROWATER 180CM (WIRE) ×1 IMPLANT

## 2022-05-23 ENCOUNTER — Encounter (HOSPITAL_COMMUNITY): Payer: Self-pay | Admitting: Interventional Cardiology

## 2022-05-25 ENCOUNTER — Other Ambulatory Visit (HOSPITAL_COMMUNITY): Payer: Self-pay

## 2022-05-26 MED FILL — Midazolam HCl Inj 2 MG/2ML (Base Equivalent): INTRAMUSCULAR | Qty: 2 | Status: AC

## 2022-05-29 ENCOUNTER — Ambulatory Visit (INDEPENDENT_AMBULATORY_CARE_PROVIDER_SITE_OTHER): Payer: PPO | Admitting: Nurse Practitioner

## 2022-05-29 ENCOUNTER — Encounter: Payer: Self-pay | Admitting: Nurse Practitioner

## 2022-05-29 VITALS — BP 118/58 | HR 71 | Ht 71.0 in | Wt 189.2 lb

## 2022-05-29 DIAGNOSIS — I350 Nonrheumatic aortic (valve) stenosis: Secondary | ICD-10-CM | POA: Diagnosis not present

## 2022-05-29 DIAGNOSIS — I495 Sick sinus syndrome: Secondary | ICD-10-CM

## 2022-05-29 DIAGNOSIS — R42 Dizziness and giddiness: Secondary | ICD-10-CM

## 2022-05-29 DIAGNOSIS — D649 Anemia, unspecified: Secondary | ICD-10-CM | POA: Diagnosis not present

## 2022-05-29 DIAGNOSIS — E785 Hyperlipidemia, unspecified: Secondary | ICD-10-CM

## 2022-05-29 DIAGNOSIS — I10 Essential (primary) hypertension: Secondary | ICD-10-CM

## 2022-05-29 DIAGNOSIS — I2510 Atherosclerotic heart disease of native coronary artery without angina pectoris: Secondary | ICD-10-CM | POA: Diagnosis not present

## 2022-05-29 DIAGNOSIS — I4821 Permanent atrial fibrillation: Secondary | ICD-10-CM

## 2022-05-29 DIAGNOSIS — K625 Hemorrhage of anus and rectum: Secondary | ICD-10-CM | POA: Diagnosis not present

## 2022-05-29 NOTE — Patient Instructions (Signed)
Medication Instructions:  Your physician recommends that you continue on your current medications as directed. Please refer to the Current Medication list given to you today.   *If you need a refill on your cardiac medications before your next appointment, please call your pharmacy*   Lab Work: Your physician recommends that you complete lab work today CBC  If you have labs (blood work) drawn today and your tests are completely normal, you will receive your results only by: [place] (if you have MyChart) OR A paper copy in the mail If you have any lab test that is abnormal or we need to change your treatment, we will call you to review the results.   Testing/Procedures: NONE ordered at this time of appointment     Follow-Up: At [facility], you and your health needs are our priority.  As part of our continuing mission to provide you with exceptional heart care, we have created designated Provider Care Teams.  These Care Teams include your primary Cardiologist (physician) and Advanced Practice Providers (APPs -  Physician Assistants and Nurse Practitioners) who all work together to provide you with the care you need, when you need it.  We recommend signing up for the patient portal called "MyChart".  Sign up information is provided on this After Visit Summary.  MyChart is used to connect with patients for Virtual Visits (Telemedicine).  Patients are able to view lab/test results, encounter notes, upcoming appointments, etc.  Non-urgent messages can be sent to your provider as well.   To learn more about what you can do with MyChart, go to [email or web address].    Your next appointment:    Keep Upcoming appointment   The format for your next appointment:   In Person  Provider:   Minus Breeding, MD     Other Instructions   Important Information About Sugar

## 2022-05-29 NOTE — Progress Notes (Signed)
Office Visit    Patient Name: Russell Griffith Date of Encounter: 05/29/2022  Primary Care Provider:  Cassandria Anger, MD Primary Cardiologist:  Minus Breeding, MD  Chief Complaint   76 year old male with a history of CAD s/p CABG x5, persistent atrial fibrillation, sinus node dysfunction s/p PPM, aortic stenosis, dizziness, hypertension, hyperlipidemia, BPH, and tubular adenoma of colon who presents for hospital follow-up s/p same-day PCI.   Past Medical History    Past Medical History:  Diagnosis Date   Anxiety    Aortic stenosis    Mild, echo, April, 2014   BPH (benign prostatic hyperplasia)    CAD (coronary artery disease)    a. 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 s/p PVI at [facility]   Precancerous lesion    Forehead   Primary osteoarthritis of left knee    Mild   Prolapsed internal hemorrhoids, grade 3 08/12/2015   PVC's (premature ventricular contractions)    Tubular adenoma of colon    Past Surgical History:  Procedure Laterality Date   ABLATION     PVI at [place] N/A 01/25/2019   Procedure: [place];  Surgeon: [name], MD;  Location: [place] [facility];  Service: Cardiovascular;  Laterality: N/A;   CARDIOVERSION N/A 10/14/2018   Procedure: CARDIOVERSION;  Surgeon: Fay Records, MD;  Location: [place];  Service: Cardiovascular;  Laterality: N/A;   CARDIOVERSION N/A 08/18/2019   Procedure: CARDIOVERSION;  Surgeon: Jerline Pain, MD;  Location: [facility] ENDOSCOPY;  Service: Cardiovascular;  Laterality: N/A;   COLONOSCOPY     CORONARY ARTERY  BYPASS GRAFT  2000   CABG X5   CORONARY STENT INTERVENTION N/A 05/22/2022   Procedure: CORONARY STENT INTERVENTION;  Surgeon: Jettie Booze, MD;  Location: [place] [facility];  Service: Cardiovascular;  Laterality: N/A;   CYSTOSCOPY WITH RETROGRADE PYELOGRAM, URETEROSCOPY AND STENT PLACEMENT Bilateral 09/04/2020   Procedure: CYSTOSCOPY WITH BILATERAL RETROGRADE PYELOGRAM, RIGHT URETEROSCOPY AND RIGHT  STENT PLACEMENT;  Surgeon: Franchot Gallo, MD;  Location: [facility] OR[facility];  Service: Urology;  Laterality: Bilateral;   ESOPHAGOGASTRODUODENOSCOPY     HEAD & NECK SKIN LESION EXCISIONAL BIOPSY     HEMORRHOID BANDING     INTRAVASCULAR PRESSURE WIRE/FFR STUDY N/A 01/05/2018   Procedure: INTRAVASCULAR PRESSURE WIRE/FFR STUDY;  Surgeon: Sherren Mocha, MD;  Location: [place] [facility];  Service: Cardiovascular;  Laterality: N/A;   INTRAVASCULAR ULTRASOUND/IVUS N/A 05/22/2022   Procedure: Intravascular Ultrasound/IVUS;  Surgeon: Jettie Booze, MD;  Location: [place] [facility];  Service: Cardiovascular;  Laterality: N/A;   LEFT HEART CATH AND CORS/GRAFTS ANGIOGRAPHY N/A 01/05/2018   Procedure: LEFT HEART CATH AND CORS/GRAFTS ANGIOGRAPHY;  Surgeon: Sherren Mocha, MD;  Location: [place] [facility];  Service: Cardiovascular;  Laterality: N/A;   PERMANENT PACEMAKER INSERTION N/A 07/15/2012   Procedure: PERMANENT PACEMAKER INSERTION;  Surgeon: Deboraha Sprang, MD;  Location: [facility] CATH LAB;  Service: Cardiovascular;  Laterality: N/A;   RIGHT/LEFT HEART CATH AND CORONARY/GRAFT ANGIOGRAPHY N/A  05/22/2022   Procedure: RIGHT/LEFT HEART CATH AND CORONARY/GRAFT ANGIOGRAPHY;  Surgeon: Jettie Booze, MD;  Location: [place] [facility];  Service: Cardiovascular;  Laterality: N/A;   rotator cuff surgery Right 2017   TONSILLECTOMY  1956   WISDOM TOOTH EXTRACTION      Allergies  Allergies  Allergen Reactions   Spironolactone     Cramps, dizziness   Cayenne Other (See Comments)    Sweats w/paprika too    Niacin And Related Other (See Comments)    Upset stomach   Sulfa Antibiotics Itching and Rash    "have no idea; mother told me I was allergic to"    Sulfonamide Derivatives Other (See Comments)    "have no idea; mother told me I was allergic to"    History of Present Illness    76 year old male with the above past medical history including CAD s/p CABG x5, persistent atrial fibrillation, sinus node dysfunction s/p PPM, aortic stenosis, dizziness, hypertension, hyperlipidemia, BPH, and tubular adenoma of colon.  He has a history of CAD s/p CABG x5 in 2000.  Cardiac catheterization in 2019 revealed severe native three-vessel CAD with total occlusion of LAD and nondominant RCA, mild left main stenosis, and moderate left circumflex stenosis, occlusion of SVG reviewed acute marginal and SVG-OM 2, patent grafts of the LIMA-LAD, SVG-diagonal, and SVG-left PDA, diagonal before the date of left greater than left circumflex, global LV systolic function with LVEF estimated at 55 to 60%.  Medical therapy was recommended.  Echocardiogram in December 2019 showed mild aortic stenosis.  Additionally, he has a history of persistent atrial fibrillation s/p ablation x2, as well as sinus node dysfunction s/p PPM.  Previously on Tikosyn, did not tolerate Multaq.  Follows with EP.  PPM is nearing ERI however, there has been consideration not to replace with given persistent atrial fibrillation.  He has had longstanding dizziness with no clear etiology.  He was last seen in the office on 05/13/2022 and reported increased dyspnea on exertion.  He underwent outpatient cardiac catheterization which revealed patent LIMA-LAD, SVG to diagonal, occluded SVG to OM, SVG-RCA , and SVG to PDA with 80% mid graft lesion which was treated with PCI/DES x1.  Additionally, he underwent PCI/DES-proximal native LCx. DAPT with ASA/plavix/eliquis was recommended for at least one month, followed by discontinuation of aspirin, continuation of  Plavix/Eliquis.  He was discharged home the same day on 05/22/2022 in stable condition.  Of note, he did have a mild episode of bradycardia during his procedure.  He presents today for follow-up.  Since his procedure he has done well from a cardiac standpoint.  He used to note mild dyspnea, however, he thinks this is improved since his procedure.  He has not exerted himself much physically since the time of his procedure.  He is eager to get back to his regular exercise routine. Since starting triple therapy with aspirin, Plavix, and Eliqus he noted 2 days of rectal bleeding, followed by dark stools.  He does note a history of hemorrhoids.  Other than his mild dyspnea, and rectal bleeding, he denies any additional concerns today.  Home Medications    Current Outpatient Medications  Medication Sig Dispense Refill   aspirin EC 81 MG tablet Take 1 tablet (81 mg total) by mouth daily. Swallow whole. 30 tablet 0   atorvastatin (LIPITOR) 40 MG tablet Take 1 tablet (40 mg total) by mouth daily. 90 tablet 1   Cholecalciferol 25 MCG (1000 UT) capsule Take 1,000 Units by mouth  daily with supper.      clopidogrel (PLAVIX) 75 MG tablet Take 1 tablet (75 mg total) by mouth daily. 30 tablet 5   ELIQUIS 5 MG TABS tablet TAKE 1 TABLET BY MOUTH TWICE DAILY. 60 tablet 11   isosorbide mononitrate (IMDUR) 60 MG 24 hr tablet Take 1 tablet (60 mg total) by mouth daily. 90 tablet 3   losartan (COZAAR) 25 MG tablet Take 0.5 tablets (12.5 mg total) by mouth daily. (Patient taking differently: Take 25 mg by mouth daily.) 90 tablet 1   nitroGLYCERIN (NITROSTAT) 0.4 MG SL tablet Place 1 tablet (0.4 mg total) under the tongue every 5 (five) minutes as needed. 25 tablet 2   NONFORMULARY OR COMPOUNDED ITEM Apply 1 application topically daily as needed (rash). Cetaphil + triamcinolone cream     pantoprazole (PROTONIX) 40 MG tablet TAKE 1 TABLET ONCE DAILY. 90 tablet 3   No current facility-administered medications for this visit.      Review of Systems    He denies chest pain, palpitations, pnd, orthopnea, n, v, dizziness, syncope, edema, weight gain, or early satiety. All other systems reviewed and are otherwise negative except as noted above.   Physical Exam    VS:  BP (!) 118/58 (BP Location: Right Arm, Patient Position: Sitting, Cuff Size: Normal)   Pulse 71   Ht '5\' 11"'$  (1.803 m)   Wt 189 lb 3.2 oz (85.8 kg)   SpO2 97%   BMI 26.39 kg/m  GEN: Well nourished, well developed, in no acute distress. HEENT: normal. Neck: Supple, no JVD, carotid bruits, or masses. Cardiac: RRR, no murmurs, rubs, or gallops. No clubbing, cyanosis, edema.  Radials/DP/PT 2+ and equal bilaterally.  Left radial cath site clean, dry, intact without significant bruising, bleeding, or hematoma. Respiratory:  Respirations regular and unlabored, clear to auscultation bilaterally. GI: Soft, nontender, nondistended, BS + x 4. MS: no deformity or atrophy. Skin: warm and dry, no rash. Neuro:  Strength and sensation are intact. Psych: Normal affect.  Accessory Clinical Findings    ECG personally reviewed by me today -atrial fibrillation, 71 bpm- no acute changes.  Lab Results  Component Value Date   WBC 5.1 05/13/2022   HGB 11.6 (L) 05/22/2022   HCT 34.0 (L) 05/22/2022   MCV 81 05/13/2022   PLT 194 05/13/2022   Lab Results  Component Value Date   CREATININE 1.16 05/13/2022   BUN 21 05/13/2022   NA 144 05/22/2022   K 3.7 05/22/2022   CL 104 05/13/2022   CO2 25 05/13/2022   Lab Results  Component Value Date   ALT 20 04/14/2022   AST 28 04/14/2022   ALKPHOS 88 04/14/2022   BILITOT 3.3 (H) 04/14/2022   Lab Results  Component Value Date   CHOL 105 04/14/2022   HDL 31.60 (L) 04/14/2022   LDLCALC 65 04/14/2022   TRIG 42.0 04/14/2022   CHOLHDL 3 04/14/2022    Lab Results  Component Value Date   HGBA1C 5.8 07/30/2015    Assessment & Plan    1. CAD: S/p CABG x 5 in 2000. Cath in 04/2022 revealed patent LIMA-LAD, SVG to  diagonal, occluded SVG to OM, SVG-RCA, and SVG to PDA with 80% mid graft lesion which was treated with PCI/DES x1. Additionally, he underwent PCI/DES-proximal native LCx. DAPT with ASA/plavix/eliquis was recommended for at least one month, followed by discontinuation of aspirin, continuation of Plavix/Eliquis.  He does note some mild dyspnea, otherwise,stable with no anginal symptoms. He has had some recent  bleeding per rectum as below (see #8).  Check CBC today.  He does note ongoing dark stools, however, he denies any frank bleeding.  For now, continue therapy.  Discussed with Dr. Percival Spanish, primary cardiologist, who states if bleeding recurs, consider discontinuation of aspirin at that time.  For now, continue aspirin, Plavix, losartan, isosorbide mononitrate, and Lipitor.  2. Persistent atrial fibrillation: Rate controlled. Continue Eliquis.  3. Sinus node dysfunction: S/p PPM. PPM is nearing ERI however, there has been consideration not to replace with given persistent atrial fibrillation. Following with EP.  4. Aortic stenosis: Mild on echo in August 2022.  He is scheduled for repeat echo in August 2023.  5. Dizziness: Denies any recent dizziness.  6. Hypertension: BP well controlled. Continue current antihypertensive regimen.   7. Hyperlipidemia: LDL was 65 in May 2023.  Continue Lipitor.  8. Anemia/rectal bleeding: Hemoglobin was 12.2 prior to recent heart cath.  Since his procedure and since starting triple therapy with aspirin, Plavix, and Eliquis he noted some rectal bleeding which stopped after 2 days.  He does have a history of hemorrhoids.  Since this time he has continued to do dark stools though he denies any frank bleeding.  Discussed with Dr. Percival Spanish, who advises continuing aspirin for now.  However, should bleeding reoccur, consider discontinuing aspirin at that time.  Ideally, he will continue 1 month with aspirin, Plavix, and Eliquis followed by Plavix and Eliquis alone.  If  bleeding persists, recommend follow-up with GI.  Repeat CBC pending.  If hemoglobin remains low, recommend follow-up with PCP.  9. Disposition: Follow-up as scheduled with Dr. Percival Spanish in August 2023.  Lenna Sciara, NP 05/29/2022, 12:45 PM

## 2022-05-30 LAB — CBC
Hematocrit: 28.2 % — ABNORMAL LOW (ref 37.5–51.0)
Hemoglobin: 9 g/dL — ABNORMAL LOW (ref 13.0–17.7)
MCH: 26.4 pg — ABNORMAL LOW (ref 26.6–33.0)
MCHC: 31.9 g/dL (ref 31.5–35.7)
MCV: 83 fL (ref 79–97)
Platelets: 203 10*3/uL (ref 150–450)
RBC: 3.41 x10E6/uL — ABNORMAL LOW (ref 4.14–5.80)
RDW: 14.6 % (ref 11.6–15.4)
WBC: 5.5 10*3/uL (ref 3.4–10.8)

## 2022-06-01 ENCOUNTER — Other Ambulatory Visit (INDEPENDENT_AMBULATORY_CARE_PROVIDER_SITE_OTHER): Payer: PPO

## 2022-06-01 ENCOUNTER — Encounter: Payer: Self-pay | Admitting: Internal Medicine

## 2022-06-01 ENCOUNTER — Ambulatory Visit (INDEPENDENT_AMBULATORY_CARE_PROVIDER_SITE_OTHER): Payer: PPO | Admitting: Internal Medicine

## 2022-06-01 VITALS — BP 102/50 | HR 67 | Temp 98.4°F | Ht 71.0 in | Wt 190.0 lb

## 2022-06-01 DIAGNOSIS — D649 Anemia, unspecified: Secondary | ICD-10-CM

## 2022-06-01 DIAGNOSIS — I4891 Unspecified atrial fibrillation: Secondary | ICD-10-CM | POA: Diagnosis not present

## 2022-06-01 DIAGNOSIS — R42 Dizziness and giddiness: Secondary | ICD-10-CM | POA: Diagnosis not present

## 2022-06-01 DIAGNOSIS — R5383 Other fatigue: Secondary | ICD-10-CM | POA: Diagnosis not present

## 2022-06-01 DIAGNOSIS — I484 Atypical atrial flutter: Secondary | ICD-10-CM

## 2022-06-01 LAB — CBC WITH DIFFERENTIAL/PLATELET
Basophils Absolute: 0 10*3/uL (ref 0.0–0.1)
Basophils Relative: 0.6 % (ref 0.0–3.0)
Eosinophils Absolute: 0.1 10*3/uL (ref 0.0–0.7)
Eosinophils Relative: 1.7 % (ref 0.0–5.0)
HCT: 24.7 % — ABNORMAL LOW (ref 39.0–52.0)
Hemoglobin: 8.2 g/dL — ABNORMAL LOW (ref 13.0–17.0)
Lymphocytes Relative: 24 % (ref 12.0–46.0)
Lymphs Abs: 1.2 10*3/uL (ref 0.7–4.0)
MCHC: 33 g/dL (ref 30.0–36.0)
MCV: 81 fl (ref 78.0–100.0)
Monocytes Absolute: 1 10*3/uL (ref 0.1–1.0)
Monocytes Relative: 20 % — ABNORMAL HIGH (ref 3.0–12.0)
Neutro Abs: 2.7 10*3/uL (ref 1.4–7.7)
Neutrophils Relative %: 53.7 % (ref 43.0–77.0)
Platelets: 189 10*3/uL (ref 150.0–400.0)
RBC: 3.05 Mil/uL — ABNORMAL LOW (ref 4.22–5.81)
RDW: 16.9 % — ABNORMAL HIGH (ref 11.5–15.5)
WBC: 5.1 10*3/uL (ref 4.0–10.5)

## 2022-06-01 MED ORDER — PANTOPRAZOLE SODIUM 40 MG PO TBEC
40.0000 mg | DELAYED_RELEASE_TABLET | Freq: Two times a day (BID) | ORAL | 1 refills | Status: DC
Start: 2022-06-01 — End: 2023-04-19

## 2022-06-01 MED ORDER — FAMOTIDINE 40 MG PO TABS: 40.0000 mg | ORAL_TABLET | Freq: Every day | ORAL | 3 refills | Status: DC

## 2022-06-01 NOTE — Assessment & Plan Note (Signed)
No relapse 

## 2022-06-01 NOTE — Assessment & Plan Note (Signed)
New. Acute upper GI bleed post-STENT Increase Protonix to bid, added Pepcid CBC, iron today

## 2022-06-01 NOTE — Assessment & Plan Note (Signed)
Check Hgb

## 2022-06-01 NOTE — Assessment & Plan Note (Signed)
In NSR today

## 2022-06-01 NOTE — Patient Instructions (Signed)
Arnica cream for bruises 

## 2022-06-01 NOTE — Progress Notes (Signed)
Subjective:  Patient ID: Russell Griffith, male    DOB: September 11, 1946  Age: 76 y.o. MRN: [id number]  CC: No chief complaint on file.   HPI Russell Griffith presents for CAD, dizziness (resolved 2 months ago), HTN, A fib SOB is better C/o dark stools post-STENTs x5 d; Hgb dropped to 9.0. Last EGD 2 years ago  Per hx: "Admit date: 05/22/2022 Discharge date: 05/22/2022   Primary Care Provider: Cassandria Anger, MD  Primary Cardiologist: Minus Breeding, MD  Primary Electrophysiologist:  Virl Axe, MD    Discharge Diagnoses    Active Problems:   CAD (coronary artery disease)   Diagnostic Studies/Procedures    Cardiac Catheterization 05/22/2022:   Mid LAD to Dist LAD lesion is 50% stenosed.  SVG to diagonal is patent.   Ost LAD to Prox LAD lesion is 100% stenosed.  LIMA to LAD is patent.   Prox Cx-1 lesion is 75% stenosed.  SVG to proximal OM is occluded.  A drug-eluting stent was successfully placed into the native circumflex using a SYNERGY XD 4.0X16, postdilated to 4.5 mm and optimized with IVUS.   Post intervention, there is a 0% residual stenosis.   Prox Cx-2 lesion is 50% stenosed. Unchanged from prior.   Dist Cx lesion is 100% stenosed.  SVG to left PDA with 80% mid graft lesion.  A drug-eluting stent was successfully placed using a SYNERGY XD 3.50X16.   Post intervention, there is a 0% residual stenosis.   Prox RCA lesion is 100% stenosed.  SVG to RCA is occluded.  Known from prior.   Ost 1st Mrg to 1st Mrg lesion is 40% stenosed.   A drug-eluting stent was successfully placed using a SYNERGY XD 4.0X16.   Aortic saturation 98%, PA saturation 75%, PA pressure 43/29, mean PA pressure 35 mmHg, mean pulmonary capillary wedge pressure 24 mmHg, cardiac output 7.2 L/min, cardiac index 3.49.   The left ventricular systolic function is normal.   LV end diastolic pressure is mildly elevated.   The left ventricular ejection fraction is 55-65% by visual estimate.    Hemodynamic findings consistent with mild to moderate pulmonary hypertension.   There is no aortic valve stenosis.   Mild to moderate pulmonary hypertension noted on right heart cath.  Mild volume overload.  May need to consider sleep apnea evaluation as well.  Successful PCI of the SVG to left PDA with a 3.5 x 16 Synergy drug-eluting stent.  Successful PCI of the proximal circumflex with a 4.0 x 16 Synergy drug-eluting stent, postdilated to 4.5 mm in diameter.  Patient tolerated the procedure well.   Of note, he did have some bradycardia despite having a pacemaker, with heart rates into the upper 30s.  After the procedure, he informed us that his ventricular lead has been turned off and his pacemaker may be removed.   Diagnostic Dominance: Left  Intervention    _____________   History of Present Illness     Russell Griffith is a 76 y.o. male with PMH of CAD status post CABG (LIMA to LAD, SVG to diagonal, SVG to proximal OM, SVG to PDA, SVG to RCA), hypertension, hyperlipidemia, persistent atrial fibrillation, PVCs, status post PPM.  He was previously followed by Dr. Ron Parker.  Most recently been followed Dr. Percival Spanish.  Also followed by EP most recently Dr. Rayann Heman.  Last underwent cardiac catheterization 12/2017 with severe native 3 vessel disease, patent LIMA to LAD, SVG to diagonal with occluded SVG to PDA, SVG- OM1.  Recommendations  for medical therapy.  Also has known persistent atrial fibrillation had previously been treated with Tikosyn, did not tolerate Multaq.  He was seen in the office on 6/14 with complaints of dizziness with no clear etiology.  He was noted at [facility] for his device but there was some consideration to not replace it given his persistent atrial fibrillation.  Given his recurrent symptoms it was recommended that he undergo outpatient right and left heart catheterization.  He was set up to see Dr. Lovena Le as an outpatient to discuss his pacemaker.   Hospital Course     The  patient underwent cardiac cath as noted above with patent LIMA to LAD, SVG to diagonal, occluded SVG to OM, SVG-RCA, and SVG to PDA with 80% mid graft lesion which was treated with PCI/DES x1. Plan for DAPT with ASA/plavix/eliquis for at least one month, then stopping ASA with continuation of Plavix/Eliquis. The patient was seen by cardiac rehab while in short stay. There were no observed complications post cath. Radial cath site was re-evaluated prior to discharge and found to be stable without any complications. Instructions/precautions regarding cath site care were given prior to discharge.   Russell Griffith was seen by Dr. Irish Lack and determined stable for discharge home. Follow up with our office has been arranged. Medications are listed below. Pertinent changes include addition of plavix, also was agreeable to switch pravastatin to atorvastatin.   _____________   Cath/PCI Registry Performance & Quality Measures: Aspirin prescribed? - Yes ADP Receptor Inhibitor (Plavix/Clopidogrel, Brilinta/Ticagrelor or Effient/Prasugrel) prescribed (includes medically managed patients)? - Yes High Intensity Statin (Lipitor 40-'80mg'$  or Crestor 20-'40mg'$ ) prescribed? - Yes For EF <40%, was ACEI/ARB prescribed? - Not Applicable (EF >/= 25%) For EF <40%, Aldosterone Antagonist (Spironolactone or Eplerenone) prescribed? - Not Applicable (EF >/= 95%) Cardiac Rehab Phase II ordered (Included Medically managed Patients)? - Yes"  Outpatient Medications Prior to Visit  Medication Sig Dispense Refill   aspirin EC 81 MG tablet Take 1 tablet (81 mg total) by mouth daily. Swallow whole. 30 tablet 0   atorvastatin (LIPITOR) 40 MG tablet Take 1 tablet (40 mg total) by mouth daily. 90 tablet 1   Cholecalciferol 25 MCG (1000 UT) capsule Take 1,000 Units by mouth daily with supper.      clopidogrel (PLAVIX) 75 MG tablet Take 1 tablet (75 mg total) by mouth daily. 30 tablet 5   ELIQUIS 5 MG TABS tablet TAKE 1 TABLET BY  MOUTH TWICE DAILY. 60 tablet 11   isosorbide mononitrate (IMDUR) 60 MG 24 hr tablet Take 1 tablet (60 mg total) by mouth daily. 90 tablet 3   losartan (COZAAR) 25 MG tablet Take 0.5 tablets (12.5 mg total) by mouth daily. (Patient taking differently: Take 25 mg by mouth daily.) 90 tablet 1   nitroGLYCERIN (NITROSTAT) 0.4 MG SL tablet Place 1 tablet (0.4 mg total) under the tongue every 5 (five) minutes as needed. 25 tablet 2   NONFORMULARY OR COMPOUNDED ITEM Apply 1 application topically daily as needed (rash). Cetaphil + triamcinolone cream     pantoprazole (PROTONIX) 40 MG tablet TAKE 1 TABLET ONCE DAILY. 90 tablet 3   No facility-administered medications prior to visit.    ROS: Review of Systems  Constitutional:  Negative for appetite change, fatigue and unexpected weight change.  HENT:  Negative for congestion, nosebleeds, sneezing, sore throat and trouble swallowing.   Eyes:  Negative for itching and visual disturbance.  Respiratory:  Negative for cough.   Cardiovascular:  Negative for chest  pain, palpitations and leg swelling.  Gastrointestinal:  Negative for abdominal distention, blood in stool, diarrhea and nausea.  Genitourinary:  Negative for frequency and hematuria.  Musculoskeletal:  Negative for back pain, gait problem, joint swelling and neck pain.  Skin:  Negative for rash.  Neurological:  Negative for dizziness, tremors, speech difficulty and weakness.  Psychiatric/Behavioral:  Negative for agitation, dysphoric mood and sleep disturbance. The patient is not nervous/anxious.     Objective:  BP (!) 102/50 (BP Location: Left Arm, Patient Position: Sitting, Cuff Size: Large)   Pulse 67   Temp 98.4 F (36.9 C) (Oral)   Ht '5\' 11"'$  (1.803 m)   Wt 190 lb (86.2 kg)   SpO2 97%   BMI 26.50 kg/m   BP Readings from Last 3 Encounters:  06/01/22 (!) 102/50  05/29/22 (!) 118/58  05/22/22 131/65    Wt Readings from Last 3 Encounters:  06/01/22 190 lb (86.2 kg)  05/29/22 189  lb 3.2 oz (85.8 kg)  05/22/22 192 lb (87.1 kg)    Physical Exam Constitutional:      General: He is not in acute distress.    Appearance: Normal appearance. He is well-developed.     Comments: NAD  Eyes:     Conjunctiva/sclera: Conjunctivae normal.     Pupils: Pupils are equal, round, and reactive to light.  Neck:     Thyroid: No thyromegaly.     Vascular: No JVD.  Cardiovascular:     Rate and Rhythm: Normal rate and regular rhythm.     Heart sounds: Normal heart sounds. No murmur heard.    No friction rub. No gallop.  Pulmonary:     Effort: Pulmonary effort is normal. No respiratory distress.     Breath sounds: Normal breath sounds. No wheezing or rales.  Chest:     Chest wall: No tenderness.  Abdominal:     General: Bowel sounds are normal. There is no distension.     Palpations: Abdomen is soft. There is no mass.     Tenderness: There is no abdominal tenderness. There is no guarding or rebound.  Musculoskeletal:        General: No tenderness. Normal range of motion.     Cervical back: Normal range of motion.  Lymphadenopathy:     Cervical: No cervical adenopathy.  Skin:    General: Skin is warm and dry.     Findings: No rash.  Neurological:     Mental Status: He is alert and oriented to person, place, and time.     Cranial Nerves: No cranial nerve deficit.     Motor: No weakness or abnormal muscle tone.     Coordination: Coordination normal.     Gait: Gait normal.     Deep Tendon Reflexes: Reflexes are normal and symmetric.  Psychiatric:        Behavior: Behavior normal.        Thought Content: Thought content normal.        Judgment: Judgment normal.     Lab Results  Component Value Date   WBC 5.5 05/29/2022   HGB 9.0 (L) 05/29/2022   HCT 28.2 (L) 05/29/2022   PLT 203 05/29/2022   GLUCOSE 125 (H) 05/13/2022   CHOL 105 04/14/2022   TRIG 42.0 04/14/2022   HDL 31.60 (L) 04/14/2022   LDLCALC 65 04/14/2022   ALT 20 04/14/2022   AST 28 04/14/2022   NA 144  05/22/2022   K 3.7 05/22/2022   CL 104 05/13/2022  CREATININE 1.16 05/13/2022   BUN 21 05/13/2022   CO2 25 05/13/2022   TSH 4.55 09/29/2021   PSA 0.81 09/29/2021   INR 1.53 (H) 07/15/2012   HGBA1C 5.8 07/30/2015    CARDIAC CATHETERIZATION  Result Date: 05/22/2022   Mid LAD to Dist LAD lesion is 50% stenosed.  SVG to diagonal is patent.   Ost LAD to Prox LAD lesion is 100% stenosed.  LIMA to LAD is patent.   Prox Cx-1 lesion is 75% stenosed.  SVG to proximal OM is occluded.  A drug-eluting stent was successfully placed into the native circumflex using a SYNERGY XD 4.0X16, postdilated to 4.5 mm and optimized with IVUS.   Post intervention, there is a 0% residual stenosis.   Prox Cx-2 lesion is 50% stenosed. Unchanged from prior.   Dist Cx lesion is 100% stenosed.  SVG to left PDA with 80% mid graft lesion.  A drug-eluting stent was successfully placed using a SYNERGY XD 3.50X16.   Post intervention, there is a 0% residual stenosis.   Prox RCA lesion is 100% stenosed.  SVG to RCA is occluded.  Known from prior.   Ost 1st Mrg to 1st Mrg lesion is 40% stenosed.   A drug-eluting stent was successfully placed using a SYNERGY XD 4.0X16.   Aortic saturation 98%, PA saturation 75%, PA pressure 43/29, mean PA pressure 35 mmHg, mean pulmonary capillary wedge pressure 24 mmHg, cardiac output 7.2 L/min, cardiac index 3.49.   The left ventricular systolic function is normal.   LV end diastolic pressure is mildly elevated.   The left ventricular ejection fraction is 55-65% by visual estimate.   Hemodynamic findings consistent with mild to moderate pulmonary hypertension.   There is no aortic valve stenosis. Mild to moderate pulmonary hypertension noted on right heart cath.  Mild volume overload.  May need to consider sleep apnea evaluation as well. Successful PCI of the SVG to left PDA with a 3.5 x 16 Synergy drug-eluting stent. Successful PCI of the proximal circumflex with a 4.0 x 16 Synergy drug-eluting stent,  postdilated to 4.5 mm in diameter.  Patient tolerated the procedure well. Of note, he did have some bradycardia despite having a pacemaker, with heart rates into the upper 30s.  After the procedure, he informed us that his ventricular lead has been turned off and his pacemaker may be removed.    Assessment & Plan:   Problem List Items Addressed This Visit     Anemia - Primary    New. Acute upper GI bleed post-STENT Increase Protonix to bid, added Pepcid CBC, iron today      Relevant Orders   CBC with Differential/Platelet   Iron, TIBC and Ferritin Panel   Atypical atrial flutter (HCC)    In NSR today      Fatigue    Check Hgb      Lightheadedness    No relapse         Meds ordered this encounter  Medications   pantoprazole (PROTONIX) 40 MG tablet    Sig: Take 1 tablet (40 mg total) by mouth 2 (two) times daily.    Dispense:  180 tablet    Refill:  1   famotidine (PEPCID) 40 MG tablet    Sig: Take 1 tablet (40 mg total) by mouth daily.    Dispense:  90 tablet    Refill:  3      Follow-up: Return in about 2 weeks (around 06/15/2022) for a follow-up visit.  Alex Letzy Gullickson,  MD

## 2022-06-02 ENCOUNTER — Other Ambulatory Visit: Payer: Self-pay | Admitting: Internal Medicine

## 2022-06-02 DIAGNOSIS — D649 Anemia, unspecified: Secondary | ICD-10-CM

## 2022-06-02 LAB — IRON,TIBC AND FERRITIN PANEL
%SAT: 10 % (calc) — ABNORMAL LOW (ref 20–48)
Ferritin: 10 ng/mL — ABNORMAL LOW (ref 24–380)
Iron: 43 ug/dL — ABNORMAL LOW (ref 50–180)
TIBC: 428 mcg/dL (calc) — ABNORMAL HIGH (ref 250–425)

## 2022-06-03 ENCOUNTER — Encounter: Payer: Self-pay | Admitting: Internal Medicine

## 2022-06-05 ENCOUNTER — Emergency Department (HOSPITAL_COMMUNITY): Payer: PPO

## 2022-06-05 ENCOUNTER — Other Ambulatory Visit (INDEPENDENT_AMBULATORY_CARE_PROVIDER_SITE_OTHER): Payer: PPO

## 2022-06-05 ENCOUNTER — Inpatient Hospital Stay (HOSPITAL_COMMUNITY)
Admission: EM | Admit: 2022-06-05 | Discharge: 2022-06-10 | DRG: 378 | Disposition: A | Discharge status: Home / Self Care | Payer: PPO | Attending: Internal Medicine | Admitting: Internal Medicine

## 2022-06-05 ENCOUNTER — Ambulatory Visit: Payer: PPO | Admitting: Cardiology

## 2022-06-05 ENCOUNTER — Encounter (HOSPITAL_COMMUNITY): Payer: Self-pay

## 2022-06-05 ENCOUNTER — Telehealth: Payer: Self-pay | Admitting: *Deleted

## 2022-06-05 ENCOUNTER — Encounter: Payer: Self-pay | Admitting: Internal Medicine

## 2022-06-05 DIAGNOSIS — Z7902 Long term (current) use of antithrombotics/antiplatelets: Secondary | ICD-10-CM

## 2022-06-05 DIAGNOSIS — K449 Diaphragmatic hernia without obstruction or gangrene: Secondary | ICD-10-CM | POA: Diagnosis not present

## 2022-06-05 DIAGNOSIS — I495 Sick sinus syndrome: Secondary | ICD-10-CM | POA: Diagnosis not present

## 2022-06-05 DIAGNOSIS — Z951 Presence of aortocoronary bypass graft: Secondary | ICD-10-CM | POA: Diagnosis not present

## 2022-06-05 DIAGNOSIS — E785 Hyperlipidemia, unspecified: Secondary | ICD-10-CM | POA: Diagnosis present

## 2022-06-05 DIAGNOSIS — K760 Fatty (change of) liver, not elsewhere classified: Secondary | ICD-10-CM | POA: Diagnosis present

## 2022-06-05 DIAGNOSIS — K2270 Barrett's esophagus without dysplasia: Secondary | ICD-10-CM | POA: Diagnosis present

## 2022-06-05 DIAGNOSIS — Z841 Family history of disorders of kidney and ureter: Secondary | ICD-10-CM

## 2022-06-05 DIAGNOSIS — K3182 Dieulafoy lesion (hemorrhagic) of stomach and duodenum: Secondary | ICD-10-CM | POA: Diagnosis not present

## 2022-06-05 DIAGNOSIS — N400 Enlarged prostate without lower urinary tract symptoms: Secondary | ICD-10-CM | POA: Diagnosis present

## 2022-06-05 DIAGNOSIS — D62 Acute posthemorrhagic anemia: Secondary | ICD-10-CM | POA: Diagnosis not present

## 2022-06-05 DIAGNOSIS — I10 Essential (primary) hypertension: Secondary | ICD-10-CM | POA: Diagnosis not present

## 2022-06-05 DIAGNOSIS — K921 Melena: Secondary | ICD-10-CM

## 2022-06-05 DIAGNOSIS — Z950 Presence of cardiac pacemaker: Secondary | ICD-10-CM | POA: Diagnosis not present

## 2022-06-05 DIAGNOSIS — K922 Gastrointestinal hemorrhage, unspecified: Secondary | ICD-10-CM | POA: Diagnosis present

## 2022-06-05 DIAGNOSIS — Z7901 Long term (current) use of anticoagulants: Secondary | ICD-10-CM | POA: Diagnosis not present

## 2022-06-05 DIAGNOSIS — K219 Gastro-esophageal reflux disease without esophagitis: Secondary | ICD-10-CM | POA: Diagnosis present

## 2022-06-05 DIAGNOSIS — I2510 Atherosclerotic heart disease of native coronary artery without angina pectoris: Secondary | ICD-10-CM | POA: Diagnosis not present

## 2022-06-05 DIAGNOSIS — I4821 Permanent atrial fibrillation: Secondary | ICD-10-CM | POA: Diagnosis present

## 2022-06-05 DIAGNOSIS — D649 Anemia, unspecified: Secondary | ICD-10-CM

## 2022-06-05 DIAGNOSIS — Z79899 Other long term (current) drug therapy: Secondary | ICD-10-CM | POA: Diagnosis not present

## 2022-06-05 DIAGNOSIS — I350 Nonrheumatic aortic (valve) stenosis: Secondary | ICD-10-CM | POA: Diagnosis present

## 2022-06-05 DIAGNOSIS — I781 Nevus, non-neoplastic: Secondary | ICD-10-CM | POA: Diagnosis not present

## 2022-06-05 DIAGNOSIS — Z955 Presence of coronary angioplasty implant and graft: Secondary | ICD-10-CM | POA: Diagnosis not present

## 2022-06-05 DIAGNOSIS — K317 Polyp of stomach and duodenum: Secondary | ICD-10-CM | POA: Diagnosis not present

## 2022-06-05 DIAGNOSIS — F419 Anxiety disorder, unspecified: Secondary | ICD-10-CM | POA: Diagnosis not present

## 2022-06-05 DIAGNOSIS — D509 Iron deficiency anemia, unspecified: Secondary | ICD-10-CM

## 2022-06-05 DIAGNOSIS — Z882 Allergy status to sulfonamides status: Secondary | ICD-10-CM

## 2022-06-05 DIAGNOSIS — I272 Pulmonary hypertension, unspecified: Secondary | ICD-10-CM | POA: Diagnosis present

## 2022-06-05 DIAGNOSIS — Z833 Family history of diabetes mellitus: Secondary | ICD-10-CM

## 2022-06-05 DIAGNOSIS — Z888 Allergy status to other drugs, medicaments and biological substances status: Secondary | ICD-10-CM | POA: Diagnosis not present

## 2022-06-05 DIAGNOSIS — Z8249 Family history of ischemic heart disease and other diseases of the circulatory system: Secondary | ICD-10-CM

## 2022-06-05 DIAGNOSIS — K2289 Other specified disease of esophagus: Secondary | ICD-10-CM | POA: Diagnosis not present

## 2022-06-05 DIAGNOSIS — K31811 Angiodysplasia of stomach and duodenum with bleeding: Secondary | ICD-10-CM | POA: Diagnosis not present

## 2022-06-05 DIAGNOSIS — I25810 Atherosclerosis of coronary artery bypass graft(s) without angina pectoris: Secondary | ICD-10-CM | POA: Diagnosis not present

## 2022-06-05 DIAGNOSIS — Z7982 Long term (current) use of aspirin: Secondary | ICD-10-CM

## 2022-06-05 DIAGNOSIS — R0602 Shortness of breath: Secondary | ICD-10-CM | POA: Diagnosis not present

## 2022-06-05 LAB — CBC WITH DIFFERENTIAL/PLATELET
Abs Immature Granulocytes: 0.01 10*3/uL (ref 0.00–0.07)
Basophils Absolute: 0 10*3/uL (ref 0.0–0.1)
Basophils Absolute: 0 10*3/uL (ref 0.0–0.1)
Basophils Relative: 0.6 % (ref 0.0–3.0)
Basophils Relative: 0 %
Eosinophils Absolute: 0.1 10*3/uL (ref 0.0–0.7)
Eosinophils Absolute: 0.1 10*3/uL (ref 0.0–0.5)
Eosinophils Relative: 2 % (ref 0.0–5.0)
Eosinophils Relative: 1 %
HCT: 20 % — CL (ref 39.0–52.0)
HCT: 22.7 % — ABNORMAL LOW (ref 39.0–52.0)
Hemoglobin: 6.6 g/dL — CL (ref 13.0–17.0)
Hemoglobin: 7 g/dL — ABNORMAL LOW (ref 13.0–17.0)
Immature Granulocytes: 0 %
Lymphocytes Relative: 23.5 % (ref 12.0–46.0)
Lymphocytes Relative: 23 %
Lymphs Abs: 1.1 10*3/uL (ref 0.7–4.0)
Lymphs Abs: 1 10*3/uL (ref 0.7–4.0)
MCH: 26.3 pg (ref 26.0–34.0)
MCHC: 33.2 g/dL (ref 30.0–36.0)
MCHC: 30.8 g/dL (ref 30.0–36.0)
MCV: 79.9 fl (ref 78.0–100.0)
MCV: 85.3 fL (ref 80.0–100.0)
Monocytes Absolute: 0.8 10*3/uL (ref 0.1–1.0)
Monocytes Absolute: 0.7 10*3/uL (ref 0.1–1.0)
Monocytes Relative: 17 % — ABNORMAL HIGH (ref 3.0–12.0)
Monocytes Relative: 15 %
Neutro Abs: 2.7 10*3/uL (ref 1.4–7.7)
Neutro Abs: 2.8 10*3/uL (ref 1.7–7.7)
Neutrophils Relative %: 56.9 % (ref 43.0–77.0)
Neutrophils Relative %: 61 %
Platelets: 192 10*3/uL (ref 150.0–400.0)
Platelets: 210 10*3/uL (ref 150–400)
RBC: 2.5 Mil/uL — ABNORMAL LOW (ref 4.22–5.81)
RBC: 2.66 MIL/uL — ABNORMAL LOW (ref 4.22–5.81)
RDW: 16.4 % — ABNORMAL HIGH (ref 11.5–15.5)
RDW: 16 % — ABNORMAL HIGH (ref 11.5–15.5)
WBC: 4.7 10*3/uL (ref 4.0–10.5)
WBC: 4.6 10*3/uL (ref 4.0–10.5)
nRBC: 0 % (ref 0.0–0.2)

## 2022-06-05 LAB — URINALYSIS, ROUTINE W REFLEX MICROSCOPIC
Bilirubin Urine: NEGATIVE
Glucose, UA: NEGATIVE mg/dL
Hgb urine dipstick: NEGATIVE
Ketones, ur: NEGATIVE mg/dL
Leukocytes,Ua: NEGATIVE
Nitrite: NEGATIVE
Protein, ur: NEGATIVE mg/dL
Specific Gravity, Urine: 1.019 (ref 1.005–1.030)
pH: 5 (ref 5.0–8.0)

## 2022-06-05 LAB — COMPREHENSIVE METABOLIC PANEL
ALT: 23 U/L (ref 0–44)
AST: 26 U/L (ref 15–41)
Albumin: 3.4 g/dL — ABNORMAL LOW (ref 3.5–5.0)
Alkaline Phosphatase: 94 U/L (ref 38–126)
Anion gap: 5 (ref 5–15)
BUN: 29 mg/dL — ABNORMAL HIGH (ref 8–23)
CO2: 21 mmol/L — ABNORMAL LOW (ref 22–32)
Calcium: 8.8 mg/dL — ABNORMAL LOW (ref 8.9–10.3)
Chloride: 111 mmol/L (ref 98–111)
Creatinine, Ser: 1.06 mg/dL (ref 0.61–1.24)
GFR, Estimated: >60 mL/min (ref >60)
Glucose, Bld: 144 mg/dL — ABNORMAL HIGH (ref 70–99)
Potassium: 3.6 mmol/L (ref 3.5–5.1)
Sodium: 137 mmol/L (ref 135–145)
Total Bilirubin: 2.3 mg/dL — ABNORMAL HIGH (ref 0.3–1.2)
Total Protein: 6.1 g/dL — ABNORMAL LOW (ref 6.5–8.1)

## 2022-06-05 LAB — PROTIME-INR
INR: 1.6 — ABNORMAL HIGH (ref 0.8–1.2)
Prothrombin Time: 18.9 seconds — ABNORMAL HIGH (ref 11.4–15.2)

## 2022-06-05 LAB — PREPARE RBC (CROSSMATCH)

## 2022-06-05 LAB — HEMOGLOBIN: Hemoglobin: 6.4 g/dL — CL (ref 13.0–17.0)

## 2022-06-05 LAB — POC OCCULT BLOOD, ED: Fecal Occult Bld: POSITIVE — AB

## 2022-06-05 LAB — ABO/RH: ABO/RH(D): A NEG

## 2022-06-05 MED ORDER — PANTOPRAZOLE SODIUM 40 MG IV SOLR
40.0000 mg | Freq: Once | INTRAVENOUS | Status: AC
  Administered 2022-06-05: 40 mg via INTRAVENOUS
  Filled 2022-06-05: qty 10

## 2022-06-05 MED ORDER — SODIUM CHLORIDE 0.9 % IV SOLN
10.0000 mL/h | Freq: Once | INTRAVENOUS | Status: AC
  Administered 2022-06-06: 10 mL/h via INTRAVENOUS

## 2022-06-05 MED ORDER — PANTOPRAZOLE SODIUM 40 MG IV SOLR
40.0000 mg | Freq: Two times a day (BID) | INTRAVENOUS | Status: DC
Start: 2022-06-05 — End: 2022-06-09
  Administered 2022-06-05 – 2022-06-09 (×7): 40 mg via INTRAVENOUS
  Filled 2022-06-05 (×8): qty 10

## 2022-06-05 MED ORDER — SODIUM CHLORIDE 0.9 % IV SOLN: INTRAVENOUS | Status: DC

## 2022-06-05 MED ORDER — ALBUTEROL SULFATE (2.5 MG/3ML) 0.083% IN NEBU: 2.5000 mg | INHALATION_SOLUTION | RESPIRATORY_TRACT | Status: DC | PRN

## 2022-06-05 NOTE — Telephone Encounter (Signed)
Noted  

## 2022-06-05 NOTE — Telephone Encounter (Signed)
Patient notified to go to ED

## 2022-06-05 NOTE — H&P (Addendum)
History and Physical    Russell Griffith AGT:[id number] DOB: [birth year] DOA: 06/05/2022  PCP: Russell Anger, MD  Patient coming from: Home  I have personally briefly reviewed patient's old medical records in [place]  Chief Complaint: black stools   HPI: Russell Griffith is a 76 y.o. male with medical history significant of Rosanna Randy syndrome,CAD s/p CABG x5, persistent atrial fibrillation, sinus node dysfunction s/p PPM, aortic stenosis, dizziness, hypertension, hyperlipidemia, BPH, and tubular adenoma of colon,GERD/Barrett's esophagus. Patient also has recent interim history of cardiac cath 6/23 at which time SVG to PDA with was noted to have 80% mid graft lesion which was treated with PCI/DES x1. Patient s/p stent was started on DAPT ASA/plavix/eliquis that was recommended for at least 48mo followed by discontinuation of asa and continuation of plavix and eliquis. Patient now presents to ED sent in referral from pcp after patient presented to pcp office with dark stools x 5 days on 7/3.  Patient labs were monitored by pcp and due to continued downward trend and persistent black stools patient was referred to ED. Patient notes no associated abdominal pain, n/v and noted no diarrhea but only daily black stools. He denies chest pain , presyncope but does note DOE and fatigue. He also denies /f/c/dysuria or blood in urine.  ED Course:  Afeb, bp 153/68, hr 76, rr 14, sat 100%  Labs: NA 137, K 3.6, Co21, glu 144, cr 1.06, alt 23, alkphos 94 Wbc: 4.6,   Hgb11.6 (6/23),9 (6/30),7 (7/7) Plt 210. Inr 1.6 ( last dose of DAPT this am) EYYQ:MGNOno hyperacute st-twave changes Heme + stools CIBB:[id number] Borderline mild cardiomegaly without pulmonary edema. No active pulmonary disease Tx protonix iv Review of Systems: As per HPI otherwise 10 point review of systems negative.   Past Medical History:  Diagnosis Date   Anxiety    Aortic stenosis    Mild, echo, April, 2014   BPH  (benign prostatic hyperplasia)    CAD (coronary artery disease)    a. s/p CABG   Carotid artery disease (HCC)    Colonic polyp    Diverticulosis    Elevated bilirubin    Mild chronic elevation, 2.0 January, 2011 stable   GERD (gastroesophageal reflux disease)    Barrett's esophagus   History of kidney stones    HTN (hypertension)    Hyperlipidemia    Low HDL   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    Left  lung chest x-ray July, 2013   Persistent [place]    [facility]  Precancerous lesion    Forehead   Primary osteoarthritis of left knee    Mild   Prolapsed internal hemorrhoids, grade 3 08/12/2015   PVC's (premature ventricular contractions)    Tubular adenoma of colon     Past Surgical History:  Procedure Laterality Date   ABL[place]     [place]   [place]  Surgeon: AThompson Grayer MD;  [place];  Service: Cardiovascular;  Laterality: N/A;   CARDIOVERSION N/A 10/14/2018   Procedure: CARDIOVERSION;  Surgeon: RFay Records MD;  [place]  Service: Cardiovascular;  Laterality: N/A;   CARDIOVERSION N/A 08/18/2019   Procedure: CARDIOVERSION;  Surgeon: SJerline Pain MD;  [place] [facility];  Service: Cardiovascular;  Laterality: N/A;   COLONOSCOPY     CORONARY ARTERY BYPASS GRAFT  2000   CABG X5   CORONARY  STENT INTERVENTION N/A 05/22/2022   Procedure: CORONARY STENT INTERVENTION;  Surgeon: Jettie Booze, MD;  Location: [place] [facility] LAB;  Service: Cardiovascular;  Laterality: N/A;   CYSTOSCOPY WITH RETROGRADE PYELOGRAM, URETEROSCOPY AND STENT PLACEMENT Bilateral 09/04/2020   Procedure: CYSTOSCOPY WITH BILATERAL RETROGRADE PYELOGRAM, RIGHT URETEROSCOPY AND RIGHT  STENT PLACEMENT;  Surgeon: Franchot Gallo, MD;  Location: [facility];  Service: Urology;  Laterality: Bilateral;   ESOPHAGOGASTRODUODENOSCOPY     HEAD & NECK SKIN LESION EXCISIONAL  BIOPSY     HEMORRHOID BANDING     INTRAVASCULAR PRESSURE WIRE/FFR STUDY N/A 01/05/2018   Procedure: INTRAVASCULAR PRESSURE WIRE/FFR STUDY;  Surgeon: Sherren Mocha, MD;  Location: [place] [facility];  Service: Cardiovascular;  Laterality: N/A;   INTRAVASCULAR ULTRASOUND/IVUS N/A 05/22/2022   Procedure: Intravascular Ultrasound/IVUS;  Surgeon: Jettie Booze, MD;  Location: [place] [facility];  Service: Cardiovascular;  Laterality: N/A;   LEFT HEART CATH AND CORS/GRAFTS ANGIOGRAPHY N/A 01/05/2018   Procedure: LEFT HEART CATH AND CORS/GRAFTS ANGIOGRAPHY;  Surgeon: Sherren Mocha, MD;  Location: [place] [facility];  Service: Cardiovascular;  Laterality: N/A;   PERMANENT PACEMAKER INSERTION N/A 07/15/2012   Procedure: PERMANENT PACEMAKER INSERTION;  Surgeon: Deboraha Sprang, MD;  Location: [facility];  Service: Cardiovascular;  Laterality: N/A;   RIGHT/LEFT HEART CATH AND CORONARY/GRAFT ANGIOGRAPHY N/A 05/22/2022   Procedure: RIGHT/LEFT HEART CATH AND CORONARY/GRAFT ANGIOGRAPHY;  Surgeon: Jettie Booze, MD;  Location: [place] [facility] LAB;  Service: Cardiovascular;  Laterality: N/A;   rotator cuff surgery Right 2017   TONSILLECTOMY  1956   WISDOM TOOTH EXTRACTION       reports that he has never smoked. He has never used smokeless tobacco. He reports current alcohol use. He reports that he does not use drugs.  Allergies  Allergen Reactions   Spironolactone     Cramps, dizziness   Cayenne Other (See Comments)    Sweats w/paprika too   Niacin And Related Other (See Comments)    Upset stomach   Sulfa Antibiotics Itching and Rash    "have no idea; mother told me I was allergic to"    Sulfonamide Derivatives Other (See Comments)    "have no idea; mother told me I was allergic to"    Family History  Problem Relation Age of Onset   Coronary artery disease Other 60   Diabetes Other    Multiple myeloma Mother    Diabetes Father    Renal Disease Father    Hyperlipidemia Other     Hypertension Other    Colon cancer Neg Hx    Esophageal cancer Neg Hx    Stomach cancer Neg Hx    Rectal cancer Neg Hx     Prior to Admission medications   Medication Sig Start Date End Date Taking? Authorizing Provider  aspirin EC 81 MG tablet Take 1 tablet (81 mg total) by mouth daily. Swallow whole. 05/22/22 05/22/23  Jettie Booze, MD  atorvastatin (LIPITOR) 40 MG tablet Take 1 tablet (40 mg total) by mouth daily. 05/22/22 11/18/22  Cheryln Manly, NP  Cholecalciferol 25 MCG (1000 UT) capsule Take 1,000 Units by mouth daily with supper.     [provider]  clopidogrel (PLAVIX) 75 MG tablet Take 1 tablet (75 mg total) by mouth daily. 05/22/22 05/22/23  Jettie Booze, MD  ELIQUIS 5 MG TABS tablet TAKE 1 TABLET BY MOUTH TWICE DAILY. 08/25/21   Deboraha Sprang, MD  famotidine (PEPCID) 40 MG tablet Take 1  tablet (40 mg total) by mouth daily. 06/01/22   [name], Evie Lacks, MD  isosorbide mononitrate (IMDUR) 60 MG 24 hr tablet Take 1 tablet (60 mg total) by mouth daily. 05/13/22   Minus Breeding, MD  losartan (COZAAR) 25 MG tablet Take 0.5 tablets (12.5 mg total) by mouth daily. Patient taking differently: Take 25 mg by mouth daily. 02/05/22   [name], Evie Lacks, MD  nitroGLYCERIN (NITROSTAT) 0.4 MG SL tablet Place 1 tablet (0.4 mg total) under the tongue every 5 (five) minutes as needed. 05/22/22   Cheryln Manly, NP  NONFORMULARY OR COMPOUNDED ITEM Apply 1 application topically daily as needed (rash). Cetaphil + triamcinolone cream    [provider]  pantoprazole (PROTONIX) 40 MG tablet Take 1 tablet (40 mg total) by mouth 2 (two) times daily. 06/01/22   [name], Evie Lacks, MD    Physical Exam: Vitals:   06/05/22 1800 06/05/22 1830 06/05/22 1845 06/05/22 1915  BP: (!) 125/57 121/65 (!) 127/54 123/64  Pulse: (!) 53 70 65 71  Resp: $Remo'17 11 15 19  'QoMuV$ Temp:      SpO2: 100% 100% 100% 99%    Vitals:   06/05/22 1800 06/05/22 1830 06/05/22 1845 06/05/22 1915   BP: (!) 125/57 121/65 (!) 127/54 123/64  Pulse: (!) 53 70 65 71  Resp: $Remo'17 11 15 19  'KsqgX$ Temp:      SpO2: 100% 100% 100% 99%   Constitutional: NAD, calm, comfortable Eyes: PERRL, lids and conjunctivae normal , sclera slight icterus ENMT: Mucous membranes are moist. Posterior pharynx clear of any exudate or lesions.Normal dentition.  Neck: normal, supple, no masses, no thyromegaly Respiratory: clear to auscultation bilaterally, no wheezing, no crackles. Normal respiratory effort. No accessory muscle use.  Cardiovascular: Regular rate and rhythm, no murmurs / rubs / gallops. Trace extremity edema. 2+ pedal pulses Abdomen: no tenderness, no masses palpated. No hepatosplenomegaly. Bowel sounds positive.  Musculoskeletal: no clubbing / cyanosis. No joint deformity upper and lower extremities. Good ROM, no contractures. Normal muscle tone.  Skin: no rashes, lesions, ulcers. No induration Neurologic: CN 2-12 grossly intact. Sensation intact,  Strength 5/5 in all 4.  Psychiatric: Normal judgment and insight. Alert and oriented x 3. Normal mood.    Labs on Admission: I have personally reviewed following labs and imaging studies  CBC: Recent Labs  Lab 06/01/22 0919 06/05/22 0843 06/05/22 1630  WBC 5.1 4.7 4.6  NEUTROABS 2.7 2.7 2.8  HGB 8.2 Repeated and verified X2.* 6.6 Repeated and verified X2.* 7.0*  HCT 24.7 Repeated and verified X2.* 20.0* 22.7*  MCV 81.0 79.9 85.3  PLT 189.0 192.0 482   Basic Metabolic Panel: Recent Labs  Lab 06/05/22 1518  NA 137  K 3.6  CL 111  CO2 21*  GLUCOSE 144*  BUN 29*  CREATININE 1.06  CALCIUM 8.8*   GFR: Estimated Creatinine Clearance: 65.1 mL/min (by C-G formula based on SCr of 1.06 mg/dL). Liver Function Tests: Recent Labs  Lab 06/05/22 1518  AST 26  ALT 23  ALKPHOS 94  BILITOT 2.3*  PROT 6.1*  ALBUMIN 3.4*   No results for input(s): "LIPASE", "AMYLASE" in the last 168 hours. No results for input(s): "AMMONIA" in the last 168  hours. Coagulation Profile: Recent Labs  Lab 06/05/22 1657  INR 1.6*   Cardiac Enzymes: No results for input(s): "CKTOTAL", "CKMB", "CKMBINDEX", "TROPONINI" in the last 168 hours. BNP (last 3 results) No results for input(s): "PROBNP" in the last 8760 hours. HbA1C: No results for input(s): "HGBA1C"  in the last 72 hours. CBG: No results for input(s): "GLUCAP" in the last 168 hours. Lipid Profile: No results for input(s): "CHOL", "HDL", "LDLCALC", "TRIG", "CHOLHDL", "LDLDIRECT" in the last 72 hours. Thyroid Function Tests: No results for input(s): "TSH", "T4TOTAL", "FREET4", "T3FREE", "THYROIDAB" in the last 72 hours. Anemia Panel: No results for input(s): "VITAMINB12", "FOLATE", "FERRITIN", "TIBC", "IRON", "RETICCTPCT" in the last 72 hours. Urine analysis:    Component Value Date/Time   COLORURINE YELLOW 01/28/2022 1409   APPEARANCEUR CLEAR 01/28/2022 1409   LABSPEC 1.015 01/28/2022 1409   PHURINE 5.0 01/28/2022 1409   GLUCOSEU NEGATIVE 01/28/2022 1409   GLUCOSEU NEGATIVE 01/27/2022 1238   HGBUR NEGATIVE 01/28/2022 1409   HGBUR negative 09/19/2009 0925   BILIRUBINUR NEGATIVE 01/28/2022 1409   BILIRUBINUR - 08/12/2020 1526   KETONESUR NEGATIVE 01/28/2022 1409   PROTEINUR NEGATIVE 01/28/2022 1409   UROBILINOGEN 4.0 (A) 01/27/2022 1238   NITRITE NEGATIVE 01/28/2022 1409   LEUKOCYTESUR NEGATIVE 01/28/2022 1409    Radiological Exams on Admission: DG Chest Port 1 View  Result Date: 06/05/2022 CLINICAL DATA:  Anemia EXAM: PORTABLE CHEST 1 VIEW COMPARISON:  02/29/2020 chest radiograph. FINDINGS: Intact sternotomy wires. Stable configuration of 2 lead left subclavian pacemaker. CABG clips overlie the left mediastinum. Stable cardiomediastinal silhouette with borderline mild cardiomegaly. No pneumothorax. No pleural effusion. Lungs appear clear, with no acute consolidative airspace disease and no pulmonary edema. IMPRESSION: Borderline mild cardiomegaly without pulmonary edema. No  active pulmonary disease. Electronically Signed   By: Ilona Sorrel M.D.   On: 06/05/2022 17:37    EKG: Independently reviewed. See abovel Assessment/Plan  Upper GI bleed -insetting of DAPT (asa/plavix/eliquis) recent stent 05/22/22 -hx of gerd and barret's esophagus -hgb 7 down from base of 11 , + heme occult stools  -holding DAPT currently  -protonix iv bid  -transfuse 1 units prbc  -cycle h/h transfuse if < 9 -npo  -[facility] to see  and scope in am   CAD s/p CABG s/p  DES 05/22/22 -holding DAPT for now per gi rec/cardiology rec -cardiology consult in am to DR Hochrein for further direction regarding DAPT therapy  -Patient s/p stent was started on DAPT ASA/plavix/eliquis that was recommended for at least 39mo followed by discontinuation of asa and continuation of plavix and eliquis  Atrial fibrillation  -holding eliquis -not on rate control agent    HTN  -stable  -hold anti-htn medication due to gi bleed   GERD Barrett;s -ppi   BPH -monitor out urine out put     HLD -resume statin one patient taking po   GRosanna Randysyndrome  -baseline bili around 3 DVT prophylaxis: scd Code Status: full Family Communication:  Disposition Plan: patient  expected to be admitted greater than 2 midnights  Consults called:  [facility], Cardiology [place]  Admission status: progressive care   SClance BollMD [facility]  If 7PM-7AM, please contact night-coverage [email or web address] Password [facility] 06/05/2022, 7:37 PM

## 2022-06-05 NOTE — Telephone Encounter (Signed)
Pt to ED now please

## 2022-06-05 NOTE — Telephone Encounter (Signed)
Critical hemoglobin- 6.6 Critical hematocrit- 20.0  MD is not in the office today, pt sent  MD email as well, but pls advise.Marland KitchenJohny Chess

## 2022-06-05 NOTE — ED Provider Triage Note (Signed)
Emergency Medicine Provider Triage Evaluation Note  Russell Griffith , a 76 y.o. male  was evaluated in triage.  Pt complains of low hemoglobin.  He was sent by his primary care provider after a hemoglobin of 6.6 was found earlier this morning.  Labs are viewable in results.  Patient is endorsing shortness of breath and some mild dizziness.  States that he has been getting more short of breath going up steps and walking on the treadmill than usual.  He had a cardiac catheterization on 6/21 where they added Plavix and aspirin to his Eliquis.  He noted bright red blood per rectum 2 days after the cardiac catheterization which lasted a few days.  His stools have been black for approximately the last week.  Associated symptoms include epigastric pain.  Denies fever, chills, night sweats, chest pain, nausea/vomiting/diarrhea, urinary symptoms.  Review of Systems  Positive: See above Negative:   Physical Exam  BP (!) 153/68   Pulse 76   Temp 98 F (36.7 C)   Resp 14   SpO2 100%  Gen:   Awake, no distress   Resp:  Normal effort  MSK:   Moves extremities without difficulty  Other:  Tender to palpation in epigastric region.  Medical Decision Making  Medically screening exam initiated at 3:05 PM.  Appropriate orders placed.  Russell Griffith was informed that the remainder of the evaluation will be completed by another provider, this initial triage assessment does not replace that evaluation, and the importance of remaining in the ED until their evaluation is complete.     Wilnette Kales, [place] 06/05/22 [id number]

## 2022-06-05 NOTE — Telephone Encounter (Signed)
Noted. Agree. Thank you.

## 2022-06-05 NOTE — ED Triage Notes (Signed)
Pt referred to ED by PCP for low Hgb. Pt states he recently had cardiac catheterization and was placed on Eliquis. Pt reports that initially he had hematochezia, but now has been having consistent melena. Pt endorses SOB. Pt's Hgb down trending from 9.0 one week ago to 6.6 today.

## 2022-06-05 NOTE — ED Provider Notes (Signed)
[facility] DEPT Provider Note   CSN: [id number] Arrival date & time: 06/05/22  1420     History {Add pertinent medical, surgical, social history, OB history to HPI:1} Chief Complaint  Patient presents with   Melena   Shortness of Breath    Russell Griffith is a 76 y.o. male.  He has a history of atrial fibrillation and is on Eliquis.  Recently had increased shortness of breath dyspnea on exertion had a cardiac cath and ultimately received 2 stents.  They added aspirin and Plavix along with isosorbide to his medications.  After discharge he had a little bit of bright red blood per rectum that he attributed to hemorrhoids.  For the last week or so he has had declining hemoglobins along with black stools.  He does endorse some chronic upper abdominal pain that is been going on for years.  His primary care doctor increased his PPI and added Pepcid without change.  He does endorse some shortness of breath dyspnea on exertion that is been worsened over the past few days.  No chest pain.  No fevers or syncope.  He had a small amount of nosebleed has not noticed any other bleeding.  Saw Dr. Ardis Hughs in the past from [place].  The history is provided by the patient.  Shortness of Breath Severity:  Moderate Onset quality:  Gradual Duration:  2 days Timing:  Intermittent Progression:  Unchanged Chronicity:  Recurrent Context: activity   Relieved by:  Rest Worsened by:  Activity Ineffective treatments:  Rest Associated symptoms: abdominal pain   Associated symptoms: no chest pain, no cough, no fever, no headaches, no hemoptysis, no neck pain, no rash, no sore throat, no sputum production, no syncope and no vomiting   Risk factors: no tobacco use        Home Medications Prior to Admission medications   Medication Sig Start Date End Date Taking? Authorizing Provider  aspirin EC 81 MG tablet Take 1 tablet (81 mg total) by mouth daily. Swallow whole. 05/22/22  05/22/23  Jettie Booze, MD  atorvastatin (LIPITOR) 40 MG tablet Take 1 tablet (40 mg total) by mouth daily. 05/22/22 11/18/22  Cheryln Manly, NP  Cholecalciferol 25 MCG (1000 UT) capsule Take 1,000 Units by mouth daily with supper.     [provider]  clopidogrel (PLAVIX) 75 MG tablet Take 1 tablet (75 mg total) by mouth daily. 05/22/22 05/22/23  Jettie Booze, MD  ELIQUIS 5 MG TABS tablet TAKE 1 TABLET BY MOUTH TWICE DAILY. 08/25/21   Deboraha Sprang, MD  famotidine (PEPCID) 40 MG tablet Take 1 tablet (40 mg total) by mouth daily. 06/01/22   [name], Evie Lacks, MD  isosorbide mononitrate (IMDUR) 60 MG 24 hr tablet Take 1 tablet (60 mg total) by mouth daily. 05/13/22   Minus Breeding, MD  losartan (COZAAR) 25 MG tablet Take 0.5 tablets (12.5 mg total) by mouth daily. Patient taking differently: Take 25 mg by mouth daily. 02/05/22   [name], Evie Lacks, MD  nitroGLYCERIN (NITROSTAT) 0.4 MG SL tablet Place 1 tablet (0.4 mg total) under the tongue every 5 (five) minutes as needed. 05/22/22   Cheryln Manly, NP  NONFORMULARY OR COMPOUNDED ITEM Apply 1 application topically daily as needed (rash). Cetaphil + triamcinolone cream    [provider]  pantoprazole (PROTONIX) 40 MG tablet Take 1 tablet (40 mg total) by mouth 2 (two) times daily. 06/01/22   [name], Evie Lacks, MD  Allergies    Spironolactone, Cayenne, Niacin and related, Sulfa antibiotics, and Sulfonamide derivatives    Review of Systems   Review of Systems  Constitutional:  Negative for fever.  HENT:  Positive for nosebleeds. Negative for sore throat.   Eyes:  Negative for visual disturbance.  Respiratory:  Positive for shortness of breath. Negative for cough, hemoptysis and sputum production.   Cardiovascular:  Negative for chest pain and syncope.  Gastrointestinal:  Positive for abdominal pain. Negative for nausea and vomiting.  Genitourinary:  Negative for dysuria.  Musculoskeletal:   Negative for neck pain.  Skin:  Negative for rash.  Neurological:  Negative for headaches.    Physical Exam Updated Vital Signs BP (!) 153/68   Pulse 76   Temp 98 F (36.7 C)   Resp 14   SpO2 100%  Physical Exam Vitals and nursing note reviewed.  Constitutional:      General: He is not in acute distress.    Appearance: He is well-developed.  HENT:     Head: Normocephalic and atraumatic.  Eyes:     Conjunctiva/sclera: Conjunctivae normal.  Cardiovascular:     Rate and Rhythm: Normal rate and regular rhythm.     Heart sounds: No murmur heard. Pulmonary:     Effort: Pulmonary effort is normal. No respiratory distress.     Breath sounds: Normal breath sounds.  Abdominal:     Palpations: Abdomen is soft.     Tenderness: There is no abdominal tenderness.  Musculoskeletal:        General: No swelling. Normal range of motion.     Cervical back: Neck supple.     Right lower leg: No tenderness. No edema.     Left lower leg: No tenderness. No edema.  Skin:    General: Skin is warm and dry.     Capillary Refill: Capillary refill takes less than 2 seconds.  Neurological:     General: No focal deficit present.     Mental Status: He is alert.     Motor: No weakness.     ED Results / Procedures / Treatments   Labs (all labs ordered are listed, but only abnormal results are displayed) Labs Reviewed  COMPREHENSIVE METABOLIC PANEL - Abnormal; Notable for the following components:      Result Value   CO2 21 (*)    Glucose, Bld 144 (*)    BUN 29 (*)    Calcium 8.8 (*)    Total Protein 6.1 (*)    Albumin 3.4 (*)    Total Bilirubin 2.3 (*)    All other components within normal limits  CBC WITH DIFFERENTIAL/PLATELET - Abnormal; Notable for the following components:   RBC 2.66 (*)    Hemoglobin 7.0 (*)    HCT 22.7 (*)    RDW 16.0 (*)    All other components within normal limits  PROTIME-INR  POC OCCULT BLOOD, ED  TYPE AND SCREEN  ABO/RH    EKG None  Radiology No  results found.  Procedures Procedures  {Document cardiac monitor, telemetry assessment procedure when appropriate:1}  Medications Ordered in ED Medications  pantoprazole (PROTONIX) injection 40 mg (has no administration in time range)    ED Course/ Medical Decision Making/ A&P                           Medical Decision Making Amount and/or Complexity of Data Reviewed Labs: ordered. Radiology: ordered.  Risk Prescription drug management.  This patient complains of ***; this involves an extensive number of treatment Options and is a complaint that carries with it a high risk of complications and morbidity. The differential includes ***  I ordered, reviewed and interpreted labs, which included *** I ordered medication *** and reviewed PMP when indicated. I ordered imaging studies which included *** and I independently    visualized and interpreted imaging which showed *** Additional history obtained from *** Previous records obtained and reviewed *** I consulted *** and discussed lab and imaging findings and discussed disposition.  Cardiac monitoring reviewed, *** Social determinants considered, *** Critical Interventions: ***  After the interventions stated above, I reevaluated the patient and found *** Admission and further testing considered, ***    {Document critical care time when appropriate:1} {Document review of labs and clinical decision tools ie heart score, Chads2Vasc2 etc:1}  {Document your independent review of radiology images, and any outside records:1} {Document your discussion with family members, caretakers, and with consultants:1} {Document social determinants of health affecting pt's care:1} {Document your decision making why or why not admission, treatments were needed:1} Final Clinical Impression(s) / ED Diagnoses Final diagnoses:  None    Rx / DC Orders ED Discharge Orders     None

## 2022-06-06 ENCOUNTER — Encounter (HOSPITAL_COMMUNITY): Payer: Self-pay | Admitting: Internal Medicine

## 2022-06-06 ENCOUNTER — Inpatient Hospital Stay (HOSPITAL_COMMUNITY): Payer: PPO | Admitting: Certified Registered Nurse Anesthetist

## 2022-06-06 ENCOUNTER — Encounter (HOSPITAL_COMMUNITY)
Admission: EM | Disposition: A | Discharge status: Home / Self Care | Payer: Self-pay | Source: Home / Self Care | Attending: Internal Medicine

## 2022-06-06 DIAGNOSIS — K317 Polyp of stomach and duodenum: Secondary | ICD-10-CM

## 2022-06-06 DIAGNOSIS — K31811 Angiodysplasia of stomach and duodenum with bleeding: Secondary | ICD-10-CM

## 2022-06-06 DIAGNOSIS — K922 Gastrointestinal hemorrhage, unspecified: Secondary | ICD-10-CM | POA: Diagnosis not present

## 2022-06-06 DIAGNOSIS — I25810 Atherosclerosis of coronary artery bypass graft(s) without angina pectoris: Secondary | ICD-10-CM

## 2022-06-06 DIAGNOSIS — K449 Diaphragmatic hernia without obstruction or gangrene: Secondary | ICD-10-CM

## 2022-06-06 DIAGNOSIS — K3182 Dieulafoy lesion (hemorrhagic) of stomach and duodenum: Secondary | ICD-10-CM

## 2022-06-06 DIAGNOSIS — K2289 Other specified disease of esophagus: Secondary | ICD-10-CM

## 2022-06-06 HISTORY — PX: HOT HEMOSTASIS: SHX5433

## 2022-06-06 HISTORY — PX: ENTEROSCOPY: SHX5533

## 2022-06-06 HISTORY — PX: SCLEROTHERAPY: SHX6841

## 2022-06-06 HISTORY — PX: HEMOSTASIS CLIP PLACEMENT: SHX6857

## 2022-06-06 LAB — BASIC METABOLIC PANEL
Anion gap: 6 (ref 5–15)
BUN: 29 mg/dL — ABNORMAL HIGH (ref 8–23)
CO2: 23 mmol/L (ref 22–32)
Calcium: 8.5 mg/dL — ABNORMAL LOW (ref 8.9–10.3)
Chloride: 109 mmol/L (ref 98–111)
Creatinine, Ser: 0.98 mg/dL (ref 0.61–1.24)
GFR, Estimated: >60 mL/min (ref >60)
Glucose, Bld: 100 mg/dL — ABNORMAL HIGH (ref 70–99)
Potassium: 3.8 mmol/L (ref 3.5–5.1)
Sodium: 138 mmol/L (ref 135–145)

## 2022-06-06 LAB — CBC
HCT: 22.9 % — ABNORMAL LOW (ref 39.0–52.0)
Hemoglobin: 7.2 g/dL — ABNORMAL LOW (ref 13.0–17.0)
MCH: 26.8 pg (ref 26.0–34.0)
MCHC: 31.4 g/dL (ref 30.0–36.0)
MCV: 85.1 fL (ref 80.0–100.0)
Platelets: 196 10*3/uL (ref 150–400)
RBC: 2.69 MIL/uL — ABNORMAL LOW (ref 4.22–5.81)
RDW: 15.9 % — ABNORMAL HIGH (ref 11.5–15.5)
WBC: 5.3 10*3/uL (ref 4.0–10.5)
nRBC: 0 % (ref 0.0–0.2)

## 2022-06-06 LAB — PROTIME-INR
INR: 1.5 — ABNORMAL HIGH (ref 0.8–1.2)
Prothrombin Time: 18.1 seconds — ABNORMAL HIGH (ref 11.4–15.2)

## 2022-06-06 LAB — HEMOGLOBIN AND HEMATOCRIT, BLOOD
HCT: 26.4 % — ABNORMAL LOW (ref 39.0–52.0)
Hemoglobin: 8.5 g/dL — ABNORMAL LOW (ref 13.0–17.0)

## 2022-06-06 LAB — HEMOGLOBIN: Hemoglobin: 9 g/dL — ABNORMAL LOW (ref 13.0–17.0)

## 2022-06-06 LAB — PREPARE RBC (CROSSMATCH)

## 2022-06-06 SURGERY — EGD, WITH ARGON PLASMA COAGULATION
Anesthesia: Monitor Anesthesia Care

## 2022-06-06 MED ORDER — PROPOFOL 10 MG/ML IV BOLUS
INTRAVENOUS | Status: DC | PRN
  Administered 2022-06-06 (×5): 20 mg via INTRAVENOUS
  Administered 2022-06-06: 30 mg via INTRAVENOUS

## 2022-06-06 MED ORDER — PROPOFOL 500 MG/50ML IV EMUL
INTRAVENOUS | Status: DC | PRN
  Administered 2022-06-06: 125 ug/kg/min via INTRAVENOUS

## 2022-06-06 MED ORDER — LACTATED RINGERS IV SOLN: INTRAVENOUS | Status: DC | PRN

## 2022-06-06 MED ORDER — PROPOFOL 500 MG/50ML IV EMUL
INTRAVENOUS | Status: AC
  Filled 2022-06-06: qty 50

## 2022-06-06 MED ORDER — EPINEPHRINE 1 MG/10ML IJ SOSY
PREFILLED_SYRINGE | INTRAMUSCULAR | Status: DC | PRN
  Administered 2022-06-06: 3 mL

## 2022-06-06 SURGICAL SUPPLY — 15 items

## 2022-06-06 NOTE — Progress Notes (Signed)
PROGRESS NOTE    Russell Griffith  AST:[id number] DOB: 08-28-1946 DOA: 06/05/2022 PCP: Cassandria Anger, MD    Brief Narrative:   Russell Griffith is a 76 y.o. male with past medical history significant for Gilbert's syndrome, CAD s/p CABG x5 with recent LHC with PCI/DES on 6/23 on DAPT, aortic stenosis, persistent atrial fibrillation on anticoagulation with Eliquis, HTN, HLD, BPH, history of tubular adenoma of colon, GERD/Barrett's esophagus who presented to [facility] ED on 7/7 by discretion of his PCP for dark tarry stools x5 days and anemia.  Patient has been monitoring patient's hemoglobin over the last week with a downward trend and given persistent black stools was sent to the ED for further evaluation.  Additionally patient reports dyspnea on exertion and fatigue.  Patient denies abdominal pain, no nausea/vomiting/diarrhea, no chest pain, no urinary symptoms.  In the ED, patient is afebrile, BP 153/68, HR 76, RR 14, SPO2 100% on room air.  Sodium 137, potassium 3.6, CO2 21, glucose 144, BUN 29, creatinine 1.06.  WBC 4.7, hemoglobin 6.6, platelets 192.0.  FOBT positive.  Chest x-ray with borderline mild cardiomegaly without pulmonary edema, no active cardiopulmonary disease process.  [facility] consulted.  [facility] consulted for further evaluation management of upper GI bleed.  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  On arrival, patient's hemoglobin noted to be 6.6 with positive FOBT.  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 --[facility] following, appreciate assistance --Hgb 6.6>7.0>6.4>7.2 --s/p 1u pRBC; receiving  additional unit PRBC this morning --Protonix 40 mg IV q12h --CLD --H&H every 8 hours, goal hemoglobin greater than 8.0 given cardiac history --Continue to hold aspirin/Plavix/Eliquis  CAD s/p CABG and DES 6/23 Follows with cardiology outpatient, Dr. Percival Spanish.  Plan following stent was for dual antiplatelet therapy with aspirin/Plavix recommended for at least 1 month followed by discontinuing of aspirin and continuation of Plavix and Eliquis.   --Continue to hold aspirin/Plavix/Eliquis for now, possible restart aspirin/Plavix in next day or so and GI would like to hold Eliquis for several days  Essential hypertension On isosorbide mononitrate 60 mg p.o. daily and losartan 25 mg p.o. daily at home, will currently will hold given GI bleed. --Monitor BP closely  HLD: Hold home atorvastatin for now  GERD/Barrett's esophagus On Protonix 40 mg p.o. twice daily at home. --Continue PPI as above  History of Gilbert's syndrome Baseline bilirubin roughly 3.  Bilirubin 2.3 on admission, stable.   DVT prophylaxis: SCDs Start: 06/05/22 2035    Code Status: Full Code Family Communication: Updated spouse present at bedside this morning  Disposition Plan:  Level of care: Progressive Status is: Inpatient Remains inpatient appropriate because: Continues to require blood transfusion this morning, will need close monitoring of hemoglobin over the next 2-3 days    Consultants:  [facility] gastroenterology Cardiology, Dr. Percival Spanish -Case discussed by admitting physician on 7/7  Procedures:  Upper endoscopy, Dr. Havery Moros 7/8  Antimicrobials:  None   Subjective: Patient seen examined bedside, resting comfortably.  Remains in ED holding area.  Just returned from PACU from upper endoscopy with findings of a single bleeding telangiectasia s/p APC with Hemoclip.  Patient slightly confused following anesthesia.  Spouse updated at bedside.  Currently receiving second unit PRBC that is infusing.  Spouse  inquiring when patient can have a diet.  No other questions or concerns at this time.  Patient denies headache, no chest pain, no palpitations, no shortness of breath, no abdominal pain.  No acute concerns reported by nursing staff overnight.  Objective: Vitals:   06/06/22 0909 06/06/22 0913 06/06/22 0924 06/06/22 1025  BP:   138/64 117/65  Pulse: 82 75 65 66  Resp: 16 15 (!) 22 (!) 26  Temp:   98.4 F (36.9 C)   TempSrc:   Oral   SpO2: 100% 100% 99% 99%  Weight:      Height:        Intake/Output Summary (Last 24 hours) at 06/06/2022 1246 Last data filed at 06/06/2022 1039 Gross per 24 hour  Intake 2516 ml  Output --  Net 2516 ml   Filed Weights   06/06/22 0708  Weight: 86.2 kg    Examination:  Physical Exam: GEN: NAD, alert and oriented x 3, wd/wn HEENT: NCAT, PERRL, EOMI, sclera clear, MMM PULM: CTAB w/o wheezes/crackles, normal respiratory effort, on room air CV: RRR w/o M/G/R GI: abd soft, NTND, NABS, no R/G/M MSK: no peripheral edema, muscle strength globally intact 5/5 bilateral upper/lower extremities NEURO: CN II-XII intact, no focal deficits, sensation to light touch intact PSYCH: normal mood/affect Integumentary: dry/intact, no rashes or wounds    Data Reviewed: I have personally reviewed following labs and imaging studies  CBC: Recent Labs  Lab 06/01/22 0919 06/05/22 0843 06/05/22 1630 06/05/22 2115 06/06/22 0406  WBC 5.1 4.7 4.6  --  5.3  NEUTROABS 2.7 2.7 2.8  --   --   HGB 8.2 Repeated and verified X2.* 6.6 Repeated and verified X2.* 7.0* 6.4* 7.2*  HCT 24.7 Repeated and verified X2.* 20.0* 22.7*  --  22.9*  MCV 81.0 79.9 85.3  --  85.1  PLT 189.0 192.0 210  --  151   Basic Metabolic Panel: Recent Labs  Lab 06/05/22 1518 06/06/22 0406  NA 137 138  K 3.6 3.8  CL 111 109  CO2 21* 23  GLUCOSE 144* 100*  BUN 29* 29*  CREATININE 1.06 0.98  CALCIUM 8.8* 8.5*   GFR: Estimated Creatinine Clearance: 70.4 mL/min (by C-G formula based on SCr  of 0.98 mg/dL). Liver Function Tests: Recent Labs  Lab 06/05/22 1518  AST 26  ALT 23  ALKPHOS 94  BILITOT 2.3*  PROT 6.1*  ALBUMIN 3.4*   No results for input(s): "LIPASE", "AMYLASE" in the last 168 hours. No results for input(s): "AMMONIA" in the last 168 hours. Coagulation Profile: Recent Labs  Lab 06/05/22 1657 06/06/22 0406  INR 1.6* 1.5*   Cardiac Enzymes: No results for input(s): "CKTOTAL", "CKMB", "CKMBINDEX", "TROPONINI" in the last 168 hours. BNP (last 3 results) No results for input(s): "PROBNP" in the last 8760 hours. HbA1C: No results for input(s): "HGBA1C" in the last 72 hours. CBG: No results for input(s): "GLUCAP" in the last 168 hours. Lipid Profile: No results for input(s): "CHOL", "HDL", "LDLCALC", "TRIG", "CHOLHDL", "LDLDIRECT" in the last 72 hours. Thyroid Function Tests: No results for input(s): "TSH", "T4TOTAL", "FREET4", "T3FREE", "THYROIDAB" in the last 72 hours. Anemia Panel: No results for input(s): "VITAMINB12", "FOLATE", "FERRITIN", "TIBC", "IRON", "RETICCTPCT" in the last 72 hours. Sepsis Labs: No results for input(s): "PROCALCITON", "LATICACIDVEN" in the last 168 hours.  No results found for this or any previous visit (from the past 240 hour(s)).       Radiology  Studies: DG Chest Port 1 View  Result Date: 06/05/2022 CLINICAL DATA:  Anemia EXAM: PORTABLE CHEST 1 VIEW COMPARISON:  02/29/2020 chest radiograph. FINDINGS: Intact sternotomy wires. Stable configuration of 2 lead left subclavian pacemaker. CABG clips overlie the left mediastinum. Stable cardiomediastinal silhouette with borderline mild cardiomegaly. No pneumothorax. No pleural effusion. Lungs appear clear, with no acute consolidative airspace disease and no pulmonary edema. IMPRESSION: Borderline mild cardiomegaly without pulmonary edema. No active pulmonary disease. Electronically Signed   By: Ilona Sorrel M.D.   On: 06/05/2022 17:37        Scheduled Meds:  pantoprazole  (PROTONIX) IV  40 mg Intravenous Q12H   Continuous Infusions:   LOS: 1 day    Time spent: 52 minutes spent on chart review, discussion with nursing staff, consultants, updating family and interview/physical exam; more than 50% of that time was spent in counseling and/or coordination of care.    Raheel Kunkle J [place] DO [facility] Available via Epic secure chat 7am-7pm After these hours, please refer to coverage provider listed on [email or web address] 06/06/2022, 12:46 PM

## 2022-06-06 NOTE — ED Notes (Signed)
Pt resting in room with family at bedside, denies any complaints at this time.

## 2022-06-06 NOTE — Op Note (Signed)
[facility] Patient Name: Russell Griffith Procedure Date: 06/06/2022 MRN: [id number] Attending MD: Carlota Raspberry. Havery Moros , MD Date of Birth: 25-Jun-1946 CSN: [id number] Age: 76 Admit Type: Outpatient Procedure:                Small bowel enteroscopy Indications:              Melena / symptomatic anemia in the setting of                            Eliquis, aspirin, Plavix Providers:                Carlota Raspberry. Havery Moros, MD, Jeanella Cara,                            RN, Ladona Ridgel, Technician, Dellie Catholic Referring MD:              Medicines:                Monitored Anesthesia Care Complications:            No immediate complications. Estimated blood loss:                            Minimal. Estimated Blood Loss:     Estimated blood loss was minimal. Procedure:                Pre-Anesthesia Assessment:                           - Prior to the procedure, a History and Physical                            was performed, and patient medications and                            allergies were reviewed. The patient's tolerance of                            previous anesthesia was also reviewed. The risks                            and benefits of the procedure and the sedation                            options and risks were discussed with the patient.                            All questions were answered, and informed consent                            was obtained. Prior Anticoagulants: The patient has                            taken Plavix (clopidogrel), last dose was 1 day  prior to procedure. ASA Grade Assessment: IV - A                            patient with severe systemic disease that is a                            constant threat to life. After reviewing the risks                            and benefits, the patient was deemed in                            satisfactory condition to undergo the procedure.                            After obtaining informed consent, the endoscope was                            passed under direct vision. Throughout the                            procedure, the patient's blood pressure, pulse, and                            oxygen saturations were monitored continuously. The                            PCF-H190TL ([id number] Olympus slim colonoscope was                            introduced through the mouth, and advanced to the                            proximal jejunum. After obtaining informed consent,                            the endoscope was passed under direct vision.                            Throughout the procedure, the patient's blood                            pressure, pulse, and oxygen saturations were                            monitored continuously.The small bowel enteroscopy                            was accomplished without difficulty. The patient                            tolerated the procedure well. Scope In: Scope Out: Findings:      The Z-line was irregular but did not meet criteria for Barrett's  biopsies.      A small 1cm hiatal hernia was present.      The exam of the esophagus was otherwise normal.      Multiple medium sessile polyps were found in the gastric fundus and in       the gastric body. Suspect likely fundic gland polyps vs. hyperplastic.       One of which was potentially adenomatous and erythematous but no clear       evidence of bleeding or stigmata of bleeding. These were not intervened       upon given recent anticoagulation and bleeding.      The exam of the stomach was otherwise normal.      Red blood was found in the entire examined duodenum.      A single diuelafoy lesion with active bleeding was found in the second       portion of the duodenum. Fulguration to stop the bleeding by argon       plasma initially was successful. There was a lot of residual red blood       in the lumen of the duodenum and proximal jejunum and it was  lavaged to       ensure no other source of bleeding and there was no other pathology       noted. To prevent bleeding post-intervention (from APC induced       ulceration in the setting of resumption of antiplatelet /       anticoagulation at some point), hemostasis clips were placed across the       treated site. There was some mucosal irritation from application of the       clips and some superficial bleeding at the edges of the site - total       four hemostatic clips were successfully placed in the area. The       treatment site was also successfully injected with 3 mL of a 1:10,000       solution of epinephrine for drug delivery during measures for       hemostasis. There was no bleeding at the end of endoscopic treatment.      The exam of the duodenum was otherwise normal.      Red blood was found in the proximal jejunum and lavaged.      Exam of the evaluted proximal jejunum was otherwise normal. Impression:               - Z-line irregular.                           - Small hiatal hernia.                           - Normal esophagus otherwise.                           - Multiple gastric polyps. Suspect benign polyps -                            one potentially adenomatous in the gastric body                            with some erythema - not removed today.                           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                            Treated with argon plasma coagulation (APC),                            epinephrine injection, and hemostasis clips as                            outlined                           - Normal remainder of examined duodenum and jejunum. Recommendation:           - Return patient to hospital ward for ongoing care.                           - NPOf this AM post endoscopic treatment.                           - Monitor  Hgb response to transfusion and for                            further bleeding                           - I anticipate the patient will pass some                            additional melena given volume of blood in the                            bowel seen on this exam                           - Clear liquid diet later today if stable                           - Continue present medications (IV protonix)                           - continue to hold Aspirin and Plavix today - may                            resume in upcoming day or so pending his course                           - continue to hold Eliquis. If okay with cardiology  would like to hold this for next several days                           - GI service will follow and reassess tomorrow -                            call in the interim with any changes Procedure Code(s):        --- Professional ---                           [phone], Small intestinal endoscopy, enteroscopy                            beyond second portion of duodenum, not including                            ileum; with control of bleeding (eg, injection,                            bipolar cautery, unipolar cautery, laser, heater                            probe, stapler, plasma coagulator)                           44799, Unlisted procedure, small intestine Diagnosis Code(s):        --- Professional ---                           K22.8, Other specified diseases of esophagus                           K44.9, Diaphragmatic hernia without obstruction or                            gangrene                           K31.7, Polyp of stomach and duodenum                           K92.2, Gastrointestinal hemorrhage, unspecified                           K31.811, Angiodysplasia of stomach and duodenum                            with bleeding                           K92.1, Melena (includes Hematochezia) CPT copyright 2019 American Medical  Association. All rights reserved. The codes documented in this report are preliminary and upon coder review may  be revised to meet current compliance requirements. Remo Lipps P. Kersti Scavone, MD 06/06/2022 8:43:08 AM This report has been signed electronically. Number of Addenda: 0

## 2022-06-06 NOTE — Consult Note (Signed)
Consultation  Referring Provider:     Myles Rosenthal Primary Care Physician:  Cassandria Anger, MD Primary Gastroenterologist:     Dr. Ardis Hughs    Reason for Consultation:     GI bleed         HPI:   Russell Griffith is a 76 y.o. male with a significant cardiac history remarkable for CAD status post CABG x5, status postcardiac cath on 6/23 at which time SVG to PDA was stenosed 80% and treated with PCI/DES x1.  He at baseline has persistent A-fib, sinus node dysfunction with pacemaker in place, aortic stenosis, history of GERD on chronic pantoprazole.  He was previously on Eliquis at baseline for A-fib, after his stent was placed in June he was started on aspirin and Plavix.  Unfortunately following addition of aspirin and Plavix to Eliquis, he developed worsening anemia.  Prestent placement his hemoglobin was around 12 with an MCV of 81.  Post stent placement on 630 his hemoglobin dropped to 9.0, labs on July 3 show significant iron deficiency, and a hemoglobin of 8.2.  He presented to the hospital yesterday with symptomatic anemia, dyspnea on exertion.  He had also endorsed several days of dark stools.  He has been having 1 black stool per day for several days now.  Hemoglobin yesterday was 6.6.  BUN elevated to 29.  Stool is heme positive.  Follow-up hemoglobin had dropped to 6.4, hemoglobin this morning 7.2.  He is not short of breath and does not have any chest pain at this time.  He has not had any bowel movement since has been in the hospital.  He denies any abdominal pain currently, but has been having some intermittent epigastric discomfort at times in recent months.  His reflux is typically well controlled on pantoprazole.  He was instructed to increase Protonix to 40 mg twice daily and added Pepcid when his stools became black.  He is feeling a bit better since his first unit of blood.  He has another unit of PRBC ordered this morning.  His blood pressure this morning is good at  133/60, heart rate in the 60s to 70s.  He looks well and states he feels well this morning.  He denies any history of GI bleeding in the past.  He had an EGD with Dr. Ardis Hughs for history of Barrett's esophagus and GERD in July 2021.  That exam showed some gastritis but no significant Barrett's or concerning pathology.  He also had a colonoscopy at that same time which did not show any remarkable findings.  His last dose of Plavix, aspirin, and Eliquis, was Friday morning around 7 AM.  He denies any other use of NSAIDs other than aspirin.  He had imaging of his abdomen pelvis with a CT scan back in March.  He has some fatty liver with borderline splenomegaly.  No overt cirrhosis.  His platelet count is normal.  Past Medical History:  Diagnosis Date   Anxiety    Aortic stenosis    Mild, echo, April, 2014   BPH (benign prostatic hyperplasia)    CAD (coronary artery disease)    a. s/p CABG   Carotid artery disease (HCC)    Colonic polyp    Diverticulosis    Elevated bilirubin    Mild chronic elevation, 2.0 January, 2011 stable   GERD (gastroesophageal reflux disease)    Barrett's esophagus   History of kidney stones    HTN (hypertension)    Hyperlipidemia  Low HDL   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HCC)    Left  lung chest x-ray July, 2013   Persistent atrial fibrillation ([place])    a. s/p PVI at [facility]   Precancerous lesion    Forehead   Primary osteoarthritis of left knee    Mild   Prolapsed internal hemorrhoids, grade 3 08/12/2015   PVC's (premature ventricular contractions)    Tubular adenoma of colon     Past Surgical History:  Procedure Laterality Date   ABLATION     PVI at [place] 01/25/2019   Procedure: [place];  Surgeon: [name], MD;  Location: [place] [facility];  Service: Cardiovascular;  Laterality: N/A;   CARDIOVERSION N/A 10/14/2018   Procedure: CARDIOVERSION;  Surgeon: Fay Records, MD;  Location: [place];  Service:  Cardiovascular;  Laterality: N/A;   CARDIOVERSION N/A 08/18/2019   Procedure: CARDIOVERSION;  Surgeon: Jerline Pain, MD;  Location: [facility] ENDOSCOPY;  Service: Cardiovascular;  Laterality: N/A;   COLONOSCOPY     CORONARY ARTERY BYPASS GRAFT  2000   CABG X5   CORONARY STENT INTERVENTION N/A 05/22/2022   Procedure: CORONARY STENT INTERVENTION;  Surgeon: Jettie Booze, MD;  Location: [place] [facility];  Service: Cardiovascular;  Laterality: N/A;   CYSTOSCOPY WITH RETROGRADE PYELOGRAM, URETEROSCOPY AND STENT PLACEMENT Bilateral 09/04/2020   Procedure: CYSTOSCOPY WITH BILATERAL RETROGRADE PYELOGRAM, RIGHT URETEROSCOPY AND RIGHT  STENT PLACEMENT;  Surgeon: Franchot Gallo, MD;  Location: [facility];  Service: Urology;  Laterality: Bilateral;   ESOPHAGOGASTRODUODENOSCOPY     HEAD & NECK SKIN LESION EXCISIONAL BIOPSY     HEMORRHOID BANDING     INTRAVASCULAR PRESSURE WIRE/FFR STUDY N/A 01/05/2018   Procedure: INTRAVASCULAR PRESSURE WIRE/FFR STUDY;  Surgeon: Sherren Mocha, MD;  Location: [place] [facility];  Service: Cardiovascular;  Laterality: N/A;   INTRAVASCULAR ULTRASOUND/IVUS N/A 05/22/2022   Procedure: Intravascular Ultrasound/IVUS;  Surgeon: Jettie Booze, MD;  Location: [place] [facility];  Service: Cardiovascular;  Laterality: N/A;   LEFT HEART CATH AND CORS/GRAFTS ANGIOGRAPHY N/A 01/05/2018   Procedure: LEFT HEART CATH AND CORS/GRAFTS ANGIOGRAPHY;  Surgeon: Sherren Mocha, MD;  Location: [place] [facility];  Service: Cardiovascular;  Laterality: N/A;   PERMANENT PACEMAKER INSERTION N/A 07/15/2012   Procedure: PERMANENT PACEMAKER INSERTION;  Surgeon: Deboraha Sprang, MD;  Location: [facility] CATH LAB;  Service: Cardiovascular;  Laterality: N/A;   RIGHT/LEFT HEART CATH AND CORONARY/GRAFT ANGIOGRAPHY N/A 05/22/2022   Procedure: RIGHT/LEFT HEART CATH AND CORONARY/GRAFT ANGIOGRAPHY;  Surgeon: Jettie Booze, MD;  Location: [place] [facility];  Service: Cardiovascular;  Laterality: N/A;    rotator cuff surgery Right 2017   TONSILLECTOMY  1956   WISDOM TOOTH EXTRACTION      Family History  Problem Relation Age of Onset   Coronary artery disease Other 12   Diabetes Other    Multiple myeloma Mother    Diabetes Father    Renal Disease Father    Hyperlipidemia Other    Hypertension Other    Colon cancer Neg Hx    Esophageal cancer Neg Hx    Stomach cancer Neg Hx    Rectal cancer Neg Hx      Social History   Tobacco Use   Smoking status: Never   Smokeless tobacco: Never  Vaping Use   Vaping Use: Never used  Substance Use Topics   Alcohol use: Yes    Comment: 1-2 glasses wine   Drug use: No  Prior to Admission medications   Medication Sig Start Date End Date Taking? Authorizing Provider  aspirin EC 81 MG tablet Take 1 tablet (81 mg total) by mouth daily. Swallow whole. 05/22/22 05/22/23 Yes Jettie Booze, MD  atorvastatin (LIPITOR) 40 MG tablet Take 1 tablet (40 mg total) by mouth daily. 05/22/22 11/18/22 Yes Cheryln Manly, NP  Cholecalciferol 25 MCG (1000 UT) capsule Take 1,000 Units by mouth daily with supper.    Yes [provider]  clopidogrel (PLAVIX) 75 MG tablet Take 1 tablet (75 mg total) by mouth daily. 05/22/22 05/22/23 Yes Jettie Booze, MD  ELIQUIS 5 MG TABS tablet TAKE 1 TABLET BY MOUTH TWICE DAILY. 08/25/21  Yes Deboraha Sprang, MD  famotidine (PEPCID) 40 MG tablet Take 1 tablet (40 mg total) by mouth daily. Patient taking differently: Take 40 mg by mouth daily as needed for heartburn or indigestion. 06/01/22  Yes [name], Evie Lacks, MD  isosorbide mononitrate (IMDUR) 60 MG 24 hr tablet Take 1 tablet (60 mg total) by mouth daily. 05/13/22  Yes Minus Breeding, MD  losartan (COZAAR) 25 MG tablet Take 0.5 tablets (12.5 mg total) by mouth daily. Patient taking differently: Take 25 mg by mouth daily. 02/05/22  Yes [name], Evie Lacks, MD  nitroGLYCERIN (NITROSTAT) 0.4 MG SL tablet Place 1 tablet (0.4 mg total) under the tongue every  5 (five) minutes as needed. 05/22/22  Yes Cheryln Manly, NP  NONFORMULARY OR COMPOUNDED ITEM Apply 1 application topically daily as needed (rash). Cetaphil + triamcinolone cream   Yes [provider]  pantoprazole (PROTONIX) 40 MG tablet Take 1 tablet (40 mg total) by mouth 2 (two) times daily. 06/01/22  Yes [name], Evie Lacks, MD    Current Facility-Administered Medications  Medication Dose Route Frequency Provider Last Rate Last Admin   0.9 %  sodium chloride infusion   Intravenous Continuous Clance Boll, MD 50 mL/hr at 06/06/22 0047 New Bag at 06/06/22 0047   [MAR Hold] albuterol (PROVENTIL) (2.5 MG/3ML) 0.083% nebulizer solution 2.5 mg  2.5 mg Nebulization Q2H PRN Clance Boll, MD       [MAR Hold] pantoprazole (PROTONIX) injection 40 mg  40 mg Intravenous Q12H Myles Rosenthal A, MD   40 mg at 06/05/22 2157    Allergies as of 06/05/2022 - Review Complete 06/05/2022  Allergen Reaction Noted   Spironolactone  09/29/2021   [place] Other (See Comments) 10/29/2015   Niacin and related Other (See Comments) 08/06/2016   Sulfa antibiotics Itching and Rash 07/27/2021   Sulfonamide derivatives Other (See Comments)      Review of Systems:    As per HPI, otherwise negative    Physical Exam:  Vital signs in last 24 hours: Temp:  [97.8 F (36.6 C)-98.2 F (36.8 C)] 97.8 F (36.6 C) (07/08 0708) Pulse Rate:  [53-76] 73 (07/08 0708) Resp:  [11-22] 17 (07/08 0708) BP: (106-153)/(53-87) 133/60 (07/08 0708) SpO2:  [93 %-100 %] 100 % (07/08 0708) Weight:  [86.2 kg] 86.2 kg (07/08 0708)   General:   Pleasant male in NAD Head:  Normocephalic and atraumatic. Eyes:   No icterus.   Conjunctiva pale. Ears:  Normal auditory acuity. Neck:  Supple Lungs:  Respirations even and unlabored. Lungs clear to auscultation bilaterally.    Heart:  Regular rate and rhythm;  Abdomen:  Soft, nondistended, nontender.  No appreciable masses or hepatomegaly.  Rectal:  Not  performed.  Msk:  Symmetrical without gross deformities.  Extremities:  Without edema. Neurologic:  Alert and  oriented x4;  grossly normal neurologically. Skin:  Intact without significant lesions or rashes. Psych:  Alert and cooperative. Normal affect.  LAB RESULTS: Recent Labs    06/05/22 0843 06/05/22 1630 06/05/22 2115 06/06/22 0406  WBC 4.7 4.6  --  5.3  HGB 6.6 Repeated and verified X2.* 7.0* 6.4* 7.2*  HCT 20.0* 22.7*  --  22.9*  PLT 192.0 210  --  196   BMET Recent Labs    06/05/22 1518 06/06/22 0406  NA 137 138  K 3.6 3.8  CL 111 109  CO2 21* 23  GLUCOSE 144* 100*  BUN 29* 29*  CREATININE 1.06 0.98  CALCIUM 8.8* 8.5*   LFT Recent Labs    06/05/22 1518  PROT 6.1*  ALBUMIN 3.4*  AST 26  ALT 23  ALKPHOS 94  BILITOT 2.3*   PT/INR Recent Labs    06/05/22 1657 06/06/22 0406  LABPROT 18.9* 18.1*  INR 1.6* 1.5*    STUDIES: DG Chest Port 1 View  Result Date: 06/05/2022 CLINICAL DATA:  Anemia EXAM: PORTABLE CHEST 1 VIEW COMPARISON:  02/29/2020 chest radiograph. FINDINGS: Intact sternotomy wires. Stable configuration of 2 lead left subclavian pacemaker. CABG clips overlie the left mediastinum. Stable cardiomediastinal silhouette with borderline mild cardiomegaly. No pneumothorax. No pleural effusion. Lungs appear clear, with no acute consolidative airspace disease and no pulmonary edema. IMPRESSION: Borderline mild cardiomegaly without pulmonary edema. No active pulmonary disease. Electronically Signed   By: Ilona Sorrel M.D.   On: 06/05/2022 17:37       Impression / Plan:   76 year old male with a baseline history of A-fib on chronic Eliquis, also with history of CAD status post CABG, with recent coronary stenting on June 23, added aspirin and Plavix to baseline Eliquis post coronary stenting, who has developed symptomatic iron deficiency anemia with what appears to be melena for several days.  Sounds like slow bleeding over multiple days to get to this  level of anemia.  He was responded to 1 unit of PRBC with another pending this morning.  He is symptomatically feeling better.  He is accompanied by his wife this morning.  His vitals are stable.  We discussed the situation, unfortunately having what appears to be upper tract bleeding in the setting of anticoagulation.  His last dose of Plavix and Eliquis was yesterday.  I am recommending an upper endoscopy initially to clear his upper tract as this is the likely location of his bleeding.  He has not eaten anything since yesterday and is NPO.  I discussed the risks of anesthesia and endoscopy with him.  He understands given his cardiac history is higher than average risk for anesthesia, risk to include cardiopulmonary compromise.  Plavix and aspirin are still in his system, hopefully we can localize bleeding and stop it, and resume aspirin and Plavix as soon as possible in light of his recent coronary stenting to prevent stent thrombosis.  Risks of endoscopy include inability to control bleeding in the setting multiple blood thinners, worsening bleeding, or injury to the bowel.  I think okay to proceed with Eliquis and Plavix on board this morning for a diagnostic exam and attempt to control bleeding.  I would not reverse Eliquis or Plavix at this time if he is otherwise stable which could cause a prothrombotic state.  Following full discussion of risk benefits of endoscopy and anesthesia, he wants to proceed.  If initial upper endoscopy is negative, will will likely place enteroscope to further evaluate  deeper into his upper bowel.  Further recommendations pending results of this exam.  Continue n.p.o. status and IV PPI in the interim, as well as another unit of blood and monitoring of hemoglobin and bleeding symptoms.  Plan: - EGD / enteroscopy this AM - continue protonix 31m IV BID - holding aspirin / plavix / Eliquis until endoscopy results, ideally once hemostasis achieved would like to resume  aspirin / plavix as soon as possible given recent coronary stenting - NPO until results of endoscopy - another unit of PRBC this AM, monitor Hgb and for recurrent bleeding  GI service will follow, call with questions in the interim.  SJolly Mango MD [facility]

## 2022-06-06 NOTE — Anesthesia Preprocedure Evaluation (Addendum)
Anesthesia Evaluation  Patient identified by MRN, date of birth, ID band Patient awake    Reviewed: Allergy & Precautions, NPO status , Patient's Chart, lab work & pertinent test results  History of Anesthesia Complications Negative for: history of anesthetic complications  Airway Mallampati: III  TM Distance: >3 FB Neck ROM: Full    Dental  (+) Teeth Intact, Dental Advisory Given   Pulmonary shortness of breath, neg sleep apnea, neg COPD, neg recent URI,    breath sounds clear to auscultation       Cardiovascular hypertension, Pt. on medications (-) angina+ CAD, + Cardiac Stents and + CABG  + pacemaker + Valvular Problems/Murmurs AS  Rhythm:Irregular Rate:Normal + Systolic murmurs 1. Left ventricular ejection fraction, by estimation, is 65 to 70%. The  left ventricle has normal function. The left ventricle has no regional  wall motion abnormalities. Left ventricular diastolic function could not  be evaluated.  2. Right ventricular systolic function is mildly reduced. The right  ventricular size is normal. There is mildly elevated pulmonary artery  systolic pressure. The estimated right ventricular systolic pressure is  77.4 mmHg.  3. The mitral valve is abnormal. Trivial mitral valve regurgitation.  4. The aortic valve is tricuspid. Aortic valve regurgitation is trivial.  Mild aortic valve stenosis. Aortic regurgitation PHT measures 626 msec.  Aortic valve area, by VTI measures 1.31 cm. Aortic valve mean gradient  measures 12.0 mmHg. Aortic valve  Vmax measures 2.51 m/s. DI is 0.35.  5. The inferior vena cava is normal in size with <50% respiratory  variability, suggesting right atrial pressure of 8 mmHg.    Mid LAD to Dist LAD lesion is 50% stenosed.  SVG to diagonal is patent. Colon Flattery LAD to Prox LAD lesion is 100% stenosed.  LIMA to LAD is patent. .  Prox Cx-1 lesion is 75% stenosed.  SVG to proximal OM is occluded.   A drug-eluting stent was successfully placed into the native circumflex using a SYNERGY XD 4.0X16, postdilated to 4.5 mm and optimized with IVUS. Marland Derksen  Post intervention, there is a 0% residual stenosis. .  Prox Cx-2 lesion is 50% stenosed. Unchanged from prior. .  Dist Cx lesion is 100% stenosed.  SVG to left PDA with 80% mid graft lesion.  A drug-eluting stent was successfully placed using a SYNERGY XD 3.50X16. Marland Treadway  Post intervention, there is a 0% residual stenosis. .  Prox RCA lesion is 100% stenosed.  SVG to RCA is occluded.  Known from prior. Colon Flattery 1st Mrg to 1st Mrg lesion is 40% stenosed. .  A drug-eluting stent was successfully placed using a SYNERGY XD 4.0X16. .  Aortic saturation 98%, PA saturation 75%, PA pressure 43/29, mean PA pressure 35 mmHg, mean pulmonary capillary wedge pressure 24 mmHg, cardiac output 7.2 L/min, cardiac index 3.49. Marland Kassin  The left ventricular systolic function is normal. .  LV end diastolic pressure is mildly elevated. .  The left ventricular ejection fraction is 55-65% by visual estimate. .  Hemodynamic findings consistent with mild to moderate pulmonary hypertension. .  There is no aortic valve stenosis.  Mild to moderate pulmonary hypertension noted on right heart cath.  Mild volume overload.  May need to consider sleep apnea evaluation as well.  Successful PCI of the SVG to left PDA with a 3.5 x 16 Synergy drug-eluting stent.  Successful PCI of the proximal circumflex with a 4.0 x 16 Synergy drug-eluting stent, postdilated to 4.5 mm in diameter.  Patient tolerated  the procedure well.  Of note, he did have some bradycardia despite having a pacemaker, with heart rates into the upper 30s.  After the procedure, he informed us that his ventricular lead has been turned off and his pacemaker may be removed.     Neuro/Psych PSYCHIATRIC DISORDERS Anxiety Depression negative neurological ROS     GI/Hepatic Neg liver ROS, GERD  ,? GI bleed   Endo/Other   negative endocrine ROS  Renal/GU negative Renal ROS     Musculoskeletal  (+) Arthritis ,   Abdominal   Peds  Hematology  (+) Blood dyscrasia, anemia , Lab Results      Component                Value               Date                      WBC                      5.3                 06/06/2022                HGB                      7.2 (L)             06/06/2022                HCT                      22.9 (L)            06/06/2022                MCV                      85.1                06/06/2022                PLT                      196                 06/06/2022             plavix/eliquis   Anesthesia Other Findings   Reproductive/Obstetrics                            Anesthesia Physical Anesthesia Plan  ASA: 4  Anesthesia Plan: MAC   Post-op Pain Management: Minimal or no pain anticipated   Induction:   PONV Risk Score and Plan: 1 and Propofol infusion  Airway Management Planned: Nasal Cannula and Natural Airway  Additional Equipment: None  Intra-op Plan:   Post-operative Plan:   Informed Consent: I have reviewed the patients History and Physical, chart, labs and discussed the procedure including the risks, benefits and alternatives for the proposed anesthesia with the patient or authorized representative who has indicated his/her understanding and acceptance.     Dental advisory given  Plan Discussed with: CRNA  Anesthesia Plan Comments:         Anesthesia Quick Evaluation

## 2022-06-06 NOTE — ED Notes (Signed)
Off the floor. Left for egd. Vss. Nadn.

## 2022-06-06 NOTE — Anesthesia Procedure Notes (Signed)
Procedure Name: MAC Date/Time: 06/06/2022 7:41 AM  Performed by: Claudia Desanctis, CRNAPre-anesthesia Checklist: Patient identified, Emergency Drugs available, Suction available and Patient being monitored Patient Re-evaluated:Patient Re-evaluated prior to induction Oxygen Delivery Method: Simple face mask

## 2022-06-06 NOTE — Anesthesia Postprocedure Evaluation (Signed)
Anesthesia Post Note  Patient: Russell Griffith  Procedure(s) Performed: ESOPHAGOGASTRODUODENOSCOPY (EGD) WITH PROPOFOL HOT HEMOSTASIS (ARGON PLASMA COAGULATION/BICAP) HEMOSTASIS CLIP PLACEMENT SCLEROTHERAPY     Patient location during evaluation: PACU Anesthesia Type: MAC Level of consciousness: patient cooperative and awake Pain management: pain level controlled Vital Signs Assessment: post-procedure vital signs reviewed and stable Respiratory status: spontaneous breathing, nonlabored ventilation, respiratory function stable and patient connected to nasal cannula oxygen Cardiovascular status: stable and blood pressure returned to baseline Postop Assessment: no apparent nausea or vomiting Anesthetic complications: no   No notable events documented.  Last Vitals:  Vitals:   06/06/22 0924 06/06/22 1025  BP: 138/64 117/65  Pulse: 65 66  Resp: (!) 22 (!) 26  Temp: 36.9 C   SpO2: 99% 99%    Last Pain:  Vitals:   06/06/22 1239  TempSrc:   PainSc: 0-No pain                 Halle Davlin

## 2022-06-06 NOTE — Transfer of Care (Signed)
Immediate Anesthesia Transfer of Care Note  Patient: Russell Griffith  Procedure(s) Performed: ESOPHAGOGASTRODUODENOSCOPY (EGD) WITH PROPOFOL HOT HEMOSTASIS (ARGON PLASMA COAGULATION/BICAP) HEMOSTASIS CLIP PLACEMENT SCLEROTHERAPY  Patient Location: PACU  Anesthesia Type:MAC  Level of Consciousness: drowsy  Airway & Oxygen Therapy: Patient Spontanous Breathing and Patient connected to face mask  Post-op Assessment: Report given to RN and Post -op Vital signs reviewed and stable  Post vital signs: Reviewed and stable  Last Vitals:  Vitals Value Taken Time  BP 132/62 06/06/22 0834  Temp    Pulse 79 06/06/22 0837  Resp 23 06/06/22 0837  SpO2 95 % 06/06/22 0837  Vitals shown include unvalidated device data.  Last Pain:  Vitals:   06/06/22 0708  TempSrc: Temporal  PainSc: 0-No pain         Complications: No notable events documented.

## 2022-06-07 ENCOUNTER — Other Ambulatory Visit: Payer: Self-pay

## 2022-06-07 DIAGNOSIS — Z7902 Long term (current) use of antithrombotics/antiplatelets: Secondary | ICD-10-CM

## 2022-06-07 DIAGNOSIS — Z7901 Long term (current) use of anticoagulants: Secondary | ICD-10-CM | POA: Diagnosis not present

## 2022-06-07 DIAGNOSIS — K922 Gastrointestinal hemorrhage, unspecified: Secondary | ICD-10-CM

## 2022-06-07 DIAGNOSIS — K3182 Dieulafoy lesion (hemorrhagic) of stomach and duodenum: Secondary | ICD-10-CM | POA: Diagnosis not present

## 2022-06-07 LAB — HEMOGLOBIN AND HEMATOCRIT, BLOOD
HCT: 26.7 % — ABNORMAL LOW (ref 39.0–52.0)
HCT: 26.9 % — ABNORMAL LOW (ref 39.0–52.0)
Hemoglobin: 8.6 g/dL — ABNORMAL LOW (ref 13.0–17.0)
Hemoglobin: 8.5 g/dL — ABNORMAL LOW (ref 13.0–17.0)

## 2022-06-07 LAB — CBC
HCT: 26.4 % — ABNORMAL LOW (ref 39.0–52.0)
Hemoglobin: 8.4 g/dL — ABNORMAL LOW (ref 13.0–17.0)
MCH: 27.3 pg (ref 26.0–34.0)
MCHC: 31.8 g/dL (ref 30.0–36.0)
MCV: 85.7 fL (ref 80.0–100.0)
Platelets: 222 10*3/uL (ref 150–400)
RBC: 3.08 MIL/uL — ABNORMAL LOW (ref 4.22–5.81)
RDW: 16.5 % — ABNORMAL HIGH (ref 11.5–15.5)
WBC: 16.5 10*3/uL — ABNORMAL HIGH (ref 4.0–10.5)
nRBC: 0 % (ref 0.0–0.2)

## 2022-06-07 LAB — BUN: BUN: 22 mg/dL (ref 8–23)

## 2022-06-07 MED ORDER — CLOPIDOGREL BISULFATE 75 MG PO TABS
75.0000 mg | ORAL_TABLET | Freq: Every day | ORAL | Status: DC
  Administered 2022-06-07 – 2022-06-10 (×4): 75 mg via ORAL
  Filled 2022-06-07 (×4): qty 1

## 2022-06-07 MED ORDER — ORAL CARE MOUTH RINSE: 15.0000 mL | OROMUCOSAL | Status: DC | PRN

## 2022-06-07 NOTE — ED Notes (Signed)
Hospitalist at bedside 

## 2022-06-07 NOTE — ED Notes (Signed)
ED TO INPATIENT HANDOFF REPORT  Name/Age/Gender Russell Griffith 76 y.o. male  Code Status    Code Status Orders  (From admission, onward)           Start     Ordered   06/05/22 2037  Full code  Continuous        06/05/22 2042           Code Status History     Date Active Date Inactive Code Status Order ID Comments User Context   05/22/2022 1036 05/22/2022 2133 Full Code [id number]  Jettie Booze, MD Inpatient   01/25/2019 1210 01/25/2019 2204 Full Code [id number]  [name], MD Inpatient   01/05/2018 1349 01/05/2018 2045 Full Code [id number]  Sherren Mocha, MD Inpatient      Advance Directive Documentation    Flowsheet Row Most Recent Value  Type of Advance Directive Healthcare Power of Attorney, Living will  Pre-existing out of facility DNR order (yellow form or pink MOST form) --  "MOST" Form in Place? --       Home/SNF/Other Home  Chief Complaint Upper GI bleed [K92.2]  Level of Care/Admitting Diagnosis ED Disposition     ED Disposition  Admit   Condition  --   [place]: [facility] [100102]  Level of Care: Progressive [102]  Admit to Progressive based on following criteria: GI, ENDOCRINE disease patients with GI bleeding, acute liver failure or pancreatitis, stable with diabetic ketoacidosis or thyrotoxicosis (hypothyroid) state.  May admit patient to [facility] or [facility] if equivalent level of care is available:: Yes  Covid Evaluation: Asymptomatic - no recent exposure (last 10 days) testing not required  Diagnosis: Upper GI bleed [329518]  Admitting Physician: Clance Boll [[id number]]  Attending Physician: Clance Boll [[id number]]  Certification:: I certify this patient will need inpatient services for at least 2 midnights          Medical History Past Medical History:  Diagnosis Date   Anxiety    Aortic stenosis    Mild, echo, April, 2014   BPH (benign prostatic hyperplasia)     CAD (coronary artery disease)    a. 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 s/p PVI at [facility]   Precancerous lesion    Forehead   Primary osteoarthritis of left knee    Mild   Prolapsed internal hemorrhoids, grade 3 08/12/2015   PVC's (premature ventricular contractions)    Tubular adenoma of colon     Allergies Allergies  Allergen Reactions   Spironolactone     Cramps, dizziness   Cayenne Other (See Comments)    Sweats w/paprika too   Niacin And Related Other (See Comments)    Upset stomach   Sulfa Antibiotics Itching and Rash    "have no idea; mother told me I was allergic to"    Sulfonamide Derivatives Other (See Comments)    "have no idea; mother told me I was allergic to"    IV Location/Drains/Wounds Patient Lines/Drains/Airways Status  Active Line/Drains/Airways     Name Placement date Placement time Site Days   Peripheral IV 06/05/22 20 G Left Antecubital 06/05/22  1813  Antecubital  2   Peripheral IV 06/06/22 20 G Distal;Right;Posterior Forearm 06/06/22  0056  Forearm  1   Ureteral Drain/Stent Right ureter 6 Fr. 09/04/20  1257  Right ureter  641   Incision (Closed) 09/04/20 Penis Right 09/04/20  1258  -- 641            Labs/Imaging Results for orders placed or performed during the hospital encounter of 06/05/22 (from the past 48 hour(s))  Protime-INR     Status: Abnormal   Collection Time: 06/05/22  4:57 PM  Result Value Ref Range   Prothrombin Time 18.9 (H) 11.4 - 15.2 seconds   INR 1.6 (H) 0.8 - 1.2    Comment: (NOTE) INR goal varies based on device and disease states. Performed at  [facility], [place] [address] [place], [place] [address]   POC occult blood, ED     Status: Abnormal   Collection Time: 06/05/22  5:58 PM  Result Value Ref Range   Fecal Occult Bld POSITIVE (A) NEGATIVE  ABO/Rh     Status: None   Collection Time: 06/05/22  9:15 PM  Result Value Ref Range   ABO/RH(D)      A NEG Performed at [place] [facility] [address] [place], [place] [address]   Prepare RBC (crossmatch)     Status: None   Collection Time: 06/05/22  9:15 PM  Result Value Ref Range   Order Confirmation      ORDER PROCESSED BY BLOOD BANK Performed at [facility] [address], [place], [place] [address]   Hemoglobin     Status: Abnormal   Collection Time: 06/05/22  9:15 PM  Result Value Ref Range   Hemoglobin 6.4 (LL) 13.0 - 17.0 g/dL    Comment: This critical result has verified and been called to Arnold Long , RN by Marvell Fuller on 07 07 2023 at 2129, and has been read back. CRITICAL RESULT VERIFIED Performed at [facility], [place] [address], [place], [place] [address]   Urinalysis, Routine w reflex microscopic Urine, Clean Catch     Status: None   Collection Time: 06/05/22 11:30 PM  Result Value Ref Range   Color, Urine YELLOW YELLOW   APPearance CLEAR CLEAR   Specific Gravity, Urine 1.019 1.005 - 1.030   pH 5.0 5.0 - 8.0   Glucose, UA NEGATIVE NEGATIVE mg/dL   Hgb urine dipstick NEGATIVE NEGATIVE   Bilirubin Urine NEGATIVE NEGATIVE   Ketones, ur NEGATIVE NEGATIVE mg/dL   Protein, ur NEGATIVE NEGATIVE mg/dL   Nitrite NEGATIVE NEGATIVE   Leukocytes,Ua NEGATIVE NEGATIVE    Comment: Performed at [facility] [address] [place], [place] [address]  Basic metabolic panel     Status: Abnormal   Collection Time: 06/06/22  4:06 AM  Result Value Ref Range   Sodium 138 135 - 145 mmol/L   Potassium 3.8 3.5 - 5.1 mmol/L   Chloride 109 98 - 111 mmol/L   CO2 23 22 - 32 mmol/L    Glucose, Bld 100 (H) 70 - 99 mg/dL    Comment: Glucose reference range applies only to samples taken after fasting for at least 8 hours.   BUN 29 (H) 8 - 23 mg/dL   Creatinine, Ser 0.98 0.61 - 1.24 mg/dL   Calcium 8.5 (L)  8.9 - 10.3 mg/dL   GFR, Estimated >60 >60 mL/min    Comment: (NOTE) Calculated using the CKD-EPI Creatinine Equation (2021)    Anion gap 6 5 - 15    Comment: Performed at [facility], [place] [address] [place], [place] [address]  CBC     Status: Abnormal   Collection Time: 06/06/22  4:06 AM  Result Value Ref Range   WBC 5.3 4.0 - 10.5 K/uL   RBC 2.69 (L) 4.22 - 5.81 MIL/uL   Hemoglobin 7.2 (L) 13.0 - 17.0 g/dL   HCT 22.9 (L) 39.0 - 52.0 %   MCV 85.1 80.0 - 100.0 fL   MCH 26.8 26.0 - 34.0 pg   MCHC 31.4 30.0 - 36.0 g/dL   RDW 15.9 (H) 11.5 - 15.5 %   Platelets 196 150 - 400 K/uL   nRBC 0.0 0.0 - 0.2 %    Comment: Performed at [facility], [place] [address], [place], [place] [address]  Protime-INR     Status: Abnormal   Collection Time: 06/06/22  4:06 AM  Result Value Ref Range   Prothrombin Time 18.1 (H) 11.4 - 15.2 seconds   INR 1.5 (H) 0.8 - 1.2    Comment: (NOTE) INR goal varies based on device and disease states. Performed at [facility], [place] [address], [place], [place] [address]   Prepare RBC (crossmatch)     Status: None   Collection Time: 06/06/22  7:21 AM  Result Value Ref Range   Order Confirmation      ORDER PROCESSED BY BLOOD BANK Performed at [facility], [place] [address], [place], [place] [address]   Hemoglobin     Status: Abnormal   Collection Time: 06/06/22 12:39 PM  Result Value Ref Range   Hemoglobin 9.0 (L) 13.0 - 17.0 g/dL    Comment: REPEATED TO VERIFY POST TRANSFUSION SPECIMEN DELTA CHECK NOTED Performed at [facility] [address], [place], [place] [address]   Hemoglobin and hematocrit, blood     Status: Abnormal   Collection Time:  06/06/22  9:00 PM  Result Value Ref Range   Hemoglobin 8.5 (L) 13.0 - 17.0 g/dL   HCT 26.4 (L) 39.0 - 52.0 %    Comment: Performed at [facility], [place] [address], [place], [place] [address]  CBC     Status: Abnormal   Collection Time: 06/07/22  4:00 AM  Result Value Ref Range   WBC 16.5 (H) 4.0 - 10.5 K/uL   RBC 3.08 (L) 4.22 - 5.81 MIL/uL   Hemoglobin 8.4 (L) 13.0 - 17.0 g/dL   HCT 26.4 (L) 39.0 - 52.0 %   MCV 85.7 80.0 - 100.0 fL   MCH 27.3 26.0 - 34.0 pg   MCHC 31.8 30.0 - 36.0 g/dL   RDW 16.5 (H) 11.5 - 15.5 %   Platelets 222 150 - 400 K/uL   nRBC 0.0 0.0 - 0.2 %    Comment: Performed at [facility], [place] [address], [place], [place] [address]  Hemoglobin and hematocrit, blood     Status: Abnormal   Collection Time: 06/07/22 11:40 AM  Result Value Ref Range   Hemoglobin 8.6 (L) 13.0 - 17.0 g/dL   HCT 26.7 (L) 39.0 - 52.0 %    Comment: Performed at [facility], [place] [address], [place], [place] [address]  BUN     Status: None   Collection Time: 06/07/22 11:40  AM  Result Value Ref Range   BUN 22 8 - 23 mg/dL    Comment: Performed at [facility], [place] [address], [place], [place] [address]   DG Chest Port 1 View  Result Date: 06/05/2022 CLINICAL DATA:  Anemia EXAM: PORTABLE CHEST 1 VIEW COMPARISON:  02/29/2020 chest radiograph. FINDINGS: Intact sternotomy wires. Stable configuration of 2 lead left subclavian pacemaker. CABG clips overlie the left mediastinum. Stable cardiomediastinal silhouette with borderline mild cardiomegaly. No pneumothorax. No pleural effusion. Lungs appear clear, with no acute consolidative airspace disease and no pulmonary edema. IMPRESSION: Borderline mild cardiomegaly without pulmonary edema. No active pulmonary disease. Electronically Signed   By: Ilona Sorrel M.D.   On: 06/05/2022 17:37    Pending Labs Unresulted Labs (From admission, onward)     Start     Ordered   06/08/22  0500  CBC  Tomorrow morning,   R        06/07/22 0637   06/08/22 0312  Basic metabolic panel  Daily,   R      06/07/22 1254   06/07/22 1200  Hemoglobin and hematocrit, blood  Now then every 8 hours,   R (with TIMED occurrences)      06/07/22 0637            Vitals/Pain Today's Vitals   06/07/22 0700 06/07/22 0735 06/07/22 0800 06/07/22 1132  BP: 114/62  116/65 124/64  Pulse: 67  64 63  Resp: '16  18 17  '$ Temp:    97.8 F (36.6 C)  TempSrc:    Oral  SpO2: 93%  96% 98%  Weight:      Height:      PainSc:  0-No pain      Isolation Precautions No active isolations  Medications Medications  albuterol (PROVENTIL) (2.5 MG/3ML) 0.083% nebulizer solution 2.5 mg ( Nebulization MAR Unhold 06/06/22 1016)  pantoprazole (PROTONIX) injection 40 mg (40 mg Intravenous Given 06/07/22 0916)  clopidogrel (PLAVIX) tablet 75 mg (75 mg Oral Given 06/07/22 1305)  pantoprazole (PROTONIX) injection 40 mg (40 mg Intravenous Given 06/05/22 1813)  0.9 %  sodium chloride infusion (0 mL/hr Intravenous Stopped 06/06/22 1039)    Mobility walks

## 2022-06-07 NOTE — Progress Notes (Addendum)
PROGRESS NOTE    Russell Griffith  FWY:[id number] DOB: 1946/11/30 DOA: 06/05/2022 PCP: Cassandria Anger, MD    Brief Narrative:   Russell Griffith is a 76 y.o. male with past medical history significant for Gilbert's syndrome, CAD s/p CABG x5 with recent LHC with PCI/DES on 6/23 on DAPT, aortic stenosis, persistent atrial fibrillation on anticoagulation with Eliquis, HTN, HLD, BPH, history of tubular adenoma of colon, GERD/Barrett's esophagus who presented to [facility] ED on 7/7 by discretion of his PCP for dark tarry stools x5 days and anemia.  Patient has been monitoring patient's hemoglobin over the last week with a downward trend and given persistent black stools was sent to the ED for further evaluation.  Additionally patient reports dyspnea on exertion and fatigue.  Patient denies abdominal pain, no nausea/vomiting/diarrhea, no chest pain, no urinary symptoms.  In the ED, patient is afebrile, BP 153/68, HR 76, RR 14, SPO2 100% on room air.  Sodium 137, potassium 3.6, CO2 21, glucose 144, BUN 29, creatinine 1.06.  WBC 4.7, hemoglobin 6.6, platelets 192.0.  FOBT positive.  Chest x-ray with borderline mild cardiomegaly without pulmonary edema, no active cardiopulmonary disease process.  [facility] consulted.  [facility] consulted for further evaluation management of upper GI bleed.  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  On arrival, patient's hemoglobin noted to be 6.6 with positive FOBT.  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 --[facility] following, appreciate assistance --Hgb 6.6>7.0>6.4>7.2>9.0>8.5>8.4>8.6 --s/p 2u  pRBC --Protonix 40 mg IV q12h --Soft diet --H&H every 8 hours, goal hemoglobin greater than 8.0 given cardiac history --Restart Plavix today --Continue to hold aspirin/Eliquis --Continue monitor on telemetry  CAD s/p CABG and DES 6/23 Follows with cardiology outpatient, Dr. Percival Spanish.  Plan following stent was for dual antiplatelet therapy with aspirin/Plavix recommended for at least 1 month followed by discontinuing of aspirin and continuation of Plavix and Eliquis.  --Given stability of hemoglobin, will restart Plavix today --Continue to hold aspirin/Eliquis for now, GI would like to hold Eliquis for several days  Essential hypertension On isosorbide mononitrate 60 mg p.o. daily and losartan 25 mg p.o. daily at home, will currently will hold given GI bleed. --Monitor BP closely  HLD: Hold home atorvastatin for now  GERD/Barrett's esophagus On Protonix 40 mg p.o. twice daily at home. --Continue PPI as above  History of Gilbert's syndrome Baseline bilirubin roughly 3.  Bilirubin 2.3 on admission, stable.   DVT prophylaxis: SCDs Start: 06/05/22 2035    Code Status: Full Code Family Communication: Updated spouse present at bedside this morning  Disposition Plan:  Level of care: Progressive Status is: Inpatient Remains inpatient appropriate because: We will need continued monitoring of hemoglobin given need for restarting antiplatelets due to recent cardiac stent, anticipate discharge in 1-2 days if hemoglobin remains stable    Consultants:  [facility] Cardiology, Dr. Percival Spanish -Case discussed by admitting physician on 7/7  Procedures:  Upper endoscopy, Dr. Havery Moros 7/8  Antimicrobials:  None   Subjective: Patient seen examined bedside, resting comfortably.  Remains in ED holding area.  Spouse present at bedside.  Feels overall much improved today.  No further bowel movements at this time.  Hemoglobin stable.  Discussed with GI, Dr. Havery Moros this  morning, if hemoglobin remains stable this afternoon may restart Plavix but continue to hold aspirin and Eliquis for now.  No other questions or concerns at this time.  Patient denies headache, no chest pain, no palpitations, no shortness of breath, no abdominal pain.  No acute concerns reported by nursing staff overnight.  Objective: Vitals:   06/07/22 0600 06/07/22 0700 06/07/22 0800 06/07/22 1132  BP: (!) 101/44 114/62 116/65 124/64  Pulse: 62 67 64 63  Resp: '18 16 18 17  '$ Temp:    97.8 F (36.6 C)  TempSrc:    Oral  SpO2: 96% 93% 96% 98%  Weight:      Height:        Intake/Output Summary (Last 24 hours) at 06/07/2022 1234 Last data filed at 06/06/2022 1553 Gross per 24 hour  Intake 564.11 ml  Output --  Net 564.11 ml   Filed Weights   06/06/22 0708  Weight: 86.2 kg    Examination:  Physical Exam: GEN: NAD, alert and oriented x 3, wd/wn HEENT: NCAT, PERRL, EOMI, sclera clear, MMM PULM: CTAB w/o wheezes/crackles, normal respiratory effort, on room air CV: IIR, normal rate, w/o M/G/R GI: abd soft, NTND, NABS, no R/G/M MSK: no peripheral edema, muscle strength globally intact 5/5 bilateral upper/lower extremities NEURO: CN II-XII intact, no focal deficits, sensation to light touch intact PSYCH: normal mood/affect Integumentary: dry/intact, no rashes or wounds    Data Reviewed: I have personally reviewed following labs and imaging studies  CBC: Recent Labs  Lab 06/01/22 0919 06/05/22 0843 06/05/22 1630 06/05/22 2115 06/06/22 0406 06/06/22 1239 06/06/22 2100 06/07/22 0400 06/07/22 1140  WBC 5.1 4.7 4.6  --  5.3  --   --  16.5*  --   NEUTROABS 2.7 2.7 2.8  --   --   --   --   --   --   HGB 8.2 Repeated and verified X2.* 6.6 Repeated and verified X2.* 7.0*   < > 7.2* 9.0* 8.5* 8.4* 8.6*  HCT 24.7 Repeated and verified X2.* 20.0* 22.7*  --  22.9*  --  26.4* 26.4* 26.7*  MCV 81.0 79.9 85.3  --  85.1  --   --  85.7  --   PLT 189.0 192.0 210  --  196  --   --  222  --     < > = values in this interval not displayed.   Basic Metabolic Panel: Recent Labs  Lab 06/05/22 1518 06/06/22 0406 06/07/22 1140  NA 137 138  --   K 3.6 3.8  --   CL 111 109  --   CO2 21* 23  --   GLUCOSE 144* 100*  --   BUN 29* 29* 22  CREATININE 1.06 0.98  --   CALCIUM 8.8* 8.5*  --    GFR: Estimated Creatinine Clearance: 70.4 mL/min (by C-G formula based on SCr of 0.98 mg/dL). Liver Function Tests: Recent Labs  Lab 06/05/22 1518  AST 26  ALT 23  ALKPHOS 94  BILITOT 2.3*  PROT 6.1*  ALBUMIN 3.4*   No results for input(s): "LIPASE", "AMYLASE" in the last 168 hours. No results for input(s): "AMMONIA" in the last 168 hours. Coagulation Profile: Recent Labs  Lab 06/05/22 1657 06/06/22 0406  INR 1.6* 1.5*   Cardiac Enzymes: No results for input(s): "CKTOTAL", "CKMB", "CKMBINDEX", "TROPONINI" in the last 168 hours. BNP (last 3 results) No results for input(s): "PROBNP" in the last 8760 hours. HbA1C:  No results for input(s): "HGBA1C" in the last 72 hours. CBG: No results for input(s): "GLUCAP" in the last 168 hours. Lipid Profile: No results for input(s): "CHOL", "HDL", "LDLCALC", "TRIG", "CHOLHDL", "LDLDIRECT" in the last 72 hours. Thyroid Function Tests: No results for input(s): "TSH", "T4TOTAL", "FREET4", "T3FREE", "THYROIDAB" in the last 72 hours. Anemia Panel: No results for input(s): "VITAMINB12", "FOLATE", "FERRITIN", "TIBC", "IRON", "RETICCTPCT" in the last 72 hours. Sepsis Labs: No results for input(s): "PROCALCITON", "LATICACIDVEN" in the last 168 hours.  No results found for this or any previous visit (from the past 240 hour(s)).       Radiology Studies: DG Chest Port 1 View  Result Date: 06/05/2022 CLINICAL DATA:  Anemia EXAM: PORTABLE CHEST 1 VIEW COMPARISON:  02/29/2020 chest radiograph. FINDINGS: Intact sternotomy wires. Stable configuration of 2 lead left subclavian pacemaker. CABG clips overlie the left mediastinum. Stable  cardiomediastinal silhouette with borderline mild cardiomegaly. No pneumothorax. No pleural effusion. Lungs appear clear, with no acute consolidative airspace disease and no pulmonary edema. IMPRESSION: Borderline mild cardiomegaly without pulmonary edema. No active pulmonary disease. Electronically Signed   By: Ilona Sorrel M.D.   On: 06/05/2022 17:37        Scheduled Meds:  clopidogrel  75 mg Oral Daily   pantoprazole (PROTONIX) IV  40 mg Intravenous Q12H   Continuous Infusions:   LOS: 2 days    Time spent: 48 minutes spent on chart review, discussion with nursing staff, consultants, updating family and interview/physical exam; more than 50% of that time was spent in counseling and/or coordination of care.    Favour Aleshire J [place] DO [facility] Available via Epic secure chat 7am-7pm After these hours, please refer to coverage provider listed on [email or web address] 06/07/2022, 12:34 PM

## 2022-06-07 NOTE — Progress Notes (Signed)
Progress Note   Subjective  Patient doing well. No bowel movements since the procedure. Hgb stablized around mid 8s post procedure. He has no pain. Ambulating.    Objective   Vital signs in last 24 hours: Temp:  [98.3 F (36.8 C)-98.4 F (36.9 C)] 98.4 F (36.9 C) (07/09 0413) Pulse Rate:  [62-83] 64 (07/09 0800) Resp:  [16-29] 18 (07/09 0800) BP: (97-128)/(44-71) 116/65 (07/09 0800) SpO2:  [92 %-99 %] 96 % (07/09 0800)   General:    white male in NAD Neurologic:  Alert and oriented,  grossly normal neurologically. Psych:  Cooperative. Normal mood and affect.  Intake/Output from previous day: 07/08 0701 - 07/09 0700 In: 2081.1 [I.V.:1451.1; Blood:630] Out: -  Intake/Output this shift: No intake/output data recorded.  Lab Results: Recent Labs    06/05/22 1630 06/05/22 2115 06/06/22 0406 06/06/22 1239 06/06/22 2100 06/07/22 0400  WBC 4.6  --  5.3  --   --  16.5*  HGB 7.0*   < > 7.2* 9.0* 8.5* 8.4*  HCT 22.7*  --  22.9*  --  26.4* 26.4*  PLT 210  --  196  --   --  222   < > = values in this interval not displayed.   BMET Recent Labs    06/05/22 1518 06/06/22 0406  NA 137 138  K 3.6 3.8  CL 111 109  CO2 21* 23  GLUCOSE 144* 100*  BUN 29* 29*  CREATININE 1.06 0.98  CALCIUM 8.8* 8.5*   LFT Recent Labs    06/05/22 1518  PROT 6.1*  ALBUMIN 3.4*  AST 26  ALT 23  ALKPHOS 94  BILITOT 2.3*   PT/INR Recent Labs    06/05/22 1657 06/06/22 0406  LABPROT 18.9* 18.1*  INR 1.6* 1.5*    Studies/Results: DG Chest Port 1 View  Result Date: 06/05/2022 CLINICAL DATA:  Anemia EXAM: PORTABLE CHEST 1 VIEW COMPARISON:  02/29/2020 chest radiograph. FINDINGS: Intact sternotomy wires. Stable configuration of 2 lead left subclavian pacemaker. CABG clips overlie the left mediastinum. Stable cardiomediastinal silhouette with borderline mild cardiomegaly. No pneumothorax. No pleural effusion. Lungs appear clear, with no acute consolidative airspace disease and no  pulmonary edema. IMPRESSION: Borderline mild cardiomegaly without pulmonary edema. No active pulmonary disease. Electronically Signed   By: Ilona Sorrel M.D.   On: 06/05/2022 17:37       Assessment / Plan:    76 year old male with a history of A-fib on chronic Eliquis at baseline, history of CAD status post CABG with recent coronary stenting on June 23, aspirin and Plavix added at that time, who presented with symptomatic iron deficiency anemia and melena for several days.  Hemoglobin was down to the sevens, was transfused yesterday and underwent urgent enteroscopy.  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  He has had no further bleeding symptoms since that time and hemoglobin seems to be stabilized.  I reviewed his course with him and his wife this morning.  His white blood cell count is elevated but he has no symptoms for infection, afebrile.  He has no abdominal pain.  We will need to monitor that.  Question is at this point time when to resume his antiplatelet therapy as given he has a very fresh stent in place, want to reduce his risk for stent thrombosis post PCI, but he is also at risk for recurrent bleeding with his recent endoscopic intervention.  We  will check his hemoglobin again at noon today and BUN.  If both continue to be stable, I think can resume Plavix later today with close monitoring.  Would continue to hold aspirin and Eliquis today.  Will allow soft diet today and see how he tolerates things.  I counseled him he still may pass some dark stools given the volume of blood that was noted in his bowel yesterday.  Of note he had some stomach polyps noted on EGD yesterday, 1 of which potentially adenomatous, consideration for surveillance EGD once he is off Plavix in 6 months.  He and wife are in agreement with the plan, Dr. Ardis Hughs to assume his GI care tomorrow.  Potentially home later tomorrow or Tuesday pending his course.   Call with symptoms of recurrent bleeding in the interim.  Plan: - soft diet today - repeat Hgb and BUN in a few hours - if stable, plan on resuming Plavix later today - and then recheck Hgb in the AM. I have discussed risks of stent thrombosis vs. risks for recurrent bleeding - continue to hold Eliquis / aspirin - can give dose of IV iron while he is in the hospital given IDA  Our service will reassess him in the AM, call with questions.  Jolly Mango, MD [facility]

## 2022-06-08 DIAGNOSIS — D649 Anemia, unspecified: Secondary | ICD-10-CM

## 2022-06-08 DIAGNOSIS — K922 Gastrointestinal hemorrhage, unspecified: Secondary | ICD-10-CM | POA: Diagnosis not present

## 2022-06-08 DIAGNOSIS — Z7901 Long term (current) use of anticoagulants: Secondary | ICD-10-CM | POA: Diagnosis not present

## 2022-06-08 DIAGNOSIS — Z7902 Long term (current) use of antithrombotics/antiplatelets: Secondary | ICD-10-CM | POA: Diagnosis not present

## 2022-06-08 LAB — CBC
HCT: 24.8 % — ABNORMAL LOW (ref 39.0–52.0)
Hemoglobin: 7.8 g/dL — ABNORMAL LOW (ref 13.0–17.0)
MCH: 27.2 pg (ref 26.0–34.0)
MCHC: 31.5 g/dL (ref 30.0–36.0)
MCV: 86.4 fL (ref 80.0–100.0)
Platelets: 194 10*3/uL (ref 150–400)
RBC: 2.87 MIL/uL — ABNORMAL LOW (ref 4.22–5.81)
RDW: 16.7 % — ABNORMAL HIGH (ref 11.5–15.5)
WBC: 9.2 10*3/uL (ref 4.0–10.5)
nRBC: 0 % (ref 0.0–0.2)

## 2022-06-08 LAB — BASIC METABOLIC PANEL
Anion gap: 6 (ref 5–15)
BUN: 21 mg/dL (ref 8–23)
CO2: 23 mmol/L (ref 22–32)
Calcium: 8.4 mg/dL — ABNORMAL LOW (ref 8.9–10.3)
Chloride: 111 mmol/L (ref 98–111)
Creatinine, Ser: 1.12 mg/dL (ref 0.61–1.24)
GFR, Estimated: >60 mL/min (ref >60)
Glucose, Bld: 103 mg/dL — ABNORMAL HIGH (ref 70–99)
Potassium: 3.5 mmol/L (ref 3.5–5.1)
Sodium: 140 mmol/L (ref 135–145)

## 2022-06-08 LAB — PREPARE RBC (CROSSMATCH)

## 2022-06-08 MED ORDER — SODIUM CHLORIDE 0.9 % IV SOLN
250.0000 mg | Freq: Every day | INTRAVENOUS | Status: AC
  Administered 2022-06-08 – 2022-06-09 (×2): 250 mg via INTRAVENOUS
  Filled 2022-06-08 (×2): qty 20

## 2022-06-08 MED ORDER — ATORVASTATIN CALCIUM 40 MG PO TABS
40.0000 mg | ORAL_TABLET | Freq: Every day | ORAL | Status: DC
  Administered 2022-06-08 – 2022-06-10 (×3): 40 mg via ORAL
  Filled 2022-06-08 (×3): qty 1

## 2022-06-08 MED ORDER — SODIUM CHLORIDE 0.9% IV SOLUTION: Freq: Once | INTRAVENOUS | Status: AC

## 2022-06-08 NOTE — Progress Notes (Signed)
PROGRESS NOTE    Russell Griffith  JEH:[id number] DOB: 1946/10/06 DOA: 06/05/2022 PCP: Cassandria Anger, MD    Brief Narrative:   Russell Griffith is a 76 y.o. male with past medical history significant for Gilbert's syndrome, CAD s/p CABG x5 with recent LHC with PCI/DES on 6/23 on DAPT, aortic stenosis, persistent atrial fibrillation on anticoagulation with Eliquis, HTN, HLD, BPH, history of tubular adenoma of colon, GERD/Barrett's esophagus who presented to [facility] [address] ED on 7/7 by discretion of his PCP for dark tarry stools x5 days and anemia.  Patient has been monitoring patient's hemoglobin over the last week with a downward trend and given persistent black stools was sent to the ED for further evaluation.  Additionally patient reports dyspnea on exertion and fatigue.  Patient denies abdominal pain, no nausea/vomiting/diarrhea, no chest pain, no urinary symptoms.  In the ED, patient is afebrile, BP 153/68, HR 76, RR 14, SPO2 100% on room air.  Sodium 137, potassium 3.6, CO2 21, glucose 144, BUN 29, creatinine 1.06.  WBC 4.7, hemoglobin 6.6, platelets 192.0.  FOBT positive.  Chest x-ray with borderline mild cardiomegaly without pulmonary edema, no active cardiopulmonary disease process.  [facility] consulted.  [facility] consulted for further evaluation management of upper GI bleed.  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  On arrival, patient's hemoglobin noted to be 6.6 with positive FOBT.  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 --[facility] following, appreciate assistance --Hgb  6.6>7.0>6.4>7.2>9.0>8.5>8.4>8.6>8.5>7.8 --s/p 2u pRBC; transfuse 1u pRBC today --Ferrlecit 250 mg IV daily x2 --Protonix 40 mg IV q12h --Soft diet --H&H after transfusion, goal hemoglobin greater than 8.0 given cardiac history --Restarted Plavix 7/9 --Continue to hold aspirin/Eliquis --Continue monitor on telemetry  CAD s/p CABG and DES 6/23 Follows with cardiology outpatient, Dr. Percival Spanish.  Plan following stent was for dual antiplatelet therapy with aspirin/Plavix recommended for at least 1 month followed by discontinuing of aspirin and continuation of Plavix and Eliquis.  --Cardiology following, appreciate assistance --Plavix restarted 7/9 --Continue to hold aspirin/Eliquis for now  Essential hypertension On isosorbide mononitrate 60 mg p.o. daily and losartan 25 mg p.o. daily at home, will currently will hold given GI bleed. --Monitor BP closely  HLD: Atorvastatin 40 mg p.o. daily  GERD/Barrett's esophagus On Protonix 40 mg p.o. twice daily at home. --Continue PPI as above  History of Gilbert's syndrome Baseline bilirubin roughly 3.  Bilirubin 2.3 on admission, stable.   DVT prophylaxis: SCDs Start: 06/05/22 2035    Code Status: Full Code Family Communication: Updated spouse present at bedside this morning  Disposition Plan:  Level of care: Progressive Status is: Inpatient Remains inpatient appropriate because: We will need continued monitoring of hemoglobin given need for restarting antiplatelets due to recent cardiac stent, anticipate discharge in 1-2 days if hemoglobin remains stable    Consultants:  [facility]  Procedures:  Upper endoscopy, Dr. Havery Moros 7/8  Antimicrobials:  None   Subjective: Patient seen examined bedside, resting comfortably.  Lying in bed.  Spouse present.  Reported dark tarry stool yesterday, none so far today.  Hemoglobin slightly down to 7.8 this morning we will give an additional unit of blood.  Cardiology  consulted for assistance  regarding dual antiplatelet therapy in the setting of recent PCI/stent.  No other questions or concerns at this time.  Patient denies headache, no chest pain, no palpitations, no shortness of breath, no abdominal pain.  No acute concerns reported by nursing staff overnight.  Objective: Vitals:   06/07/22 2138 06/08/22 0206 06/08/22 0500 06/08/22 0555  BP: 128/60 (!) 112/48  (!) 110/94  Pulse: 65 72  68  Resp: 18 19  (!) 21  Temp: 98.2 F (36.8 C) 98.8 F (37.1 C)  98.2 F (36.8 C)  TempSrc: Oral Oral  Oral  SpO2: 99% 95%  97%  Weight:   88.4 kg   Height:        Intake/Output Summary (Last 24 hours) at 06/08/2022 1206 Last data filed at 06/08/2022 7253 Gross per 24 hour  Intake 480 ml  Output --  Net 480 ml   Filed Weights   06/06/22 0708 06/08/22 0500  Weight: 86.2 kg 88.4 kg    Examination:  Physical Exam: GEN: NAD, alert and oriented x 3, wd/wn HEENT: NCAT, PERRL, EOMI, sclera clear, MMM PULM: CTAB w/o wheezes/crackles, normal respiratory effort, on room air CV: IIR, normal rate, w/o M/G/R GI: abd soft, NTND, NABS, no R/G/M MSK: no peripheral edema, muscle strength globally intact 5/5 bilateral upper/lower extremities NEURO: CN II-XII intact, no focal deficits, sensation to light touch intact PSYCH: normal mood/affect Integumentary: dry/intact, no rashes or wounds    Data Reviewed: I have personally reviewed following labs and imaging studies  CBC: Recent Labs  Lab 06/05/22 0843 06/05/22 1630 06/05/22 2115 06/06/22 0406 06/06/22 1239 06/06/22 2100 06/07/22 0400 06/07/22 1140 06/07/22 1956 06/08/22 0357  WBC 4.7 4.6  --  5.3  --   --  16.5*  --   --  9.2  NEUTROABS 2.7 2.8  --   --   --   --   --   --   --   --   HGB 6.6 Repeated and verified X2.* 7.0*   < > 7.2*   < > 8.5* 8.4* 8.6* 8.5* 7.8*  HCT 20.0* 22.7*  --  22.9*  --  26.4* 26.4* 26.7* 26.9* 24.8*  MCV 79.9 85.3  --  85.1  --   --  85.7  --   --  86.4  PLT 192.0 210  --   196  --   --  222  --   --  194   < > = values in this interval not displayed.   Basic Metabolic Panel: Recent Labs  Lab 06/05/22 1518 06/06/22 0406 06/07/22 1140 06/08/22 0357  NA 137 138  --  140  K 3.6 3.8  --  3.5  CL 111 109  --  111  CO2 21* 23  --  23  GLUCOSE 144* 100*  --  103*  BUN 29* 29* 22 21  CREATININE 1.06 0.98  --  1.12  CALCIUM 8.8* 8.5*  --  8.4*   GFR: Estimated Creatinine Clearance: 61.6 mL/min (by C-G formula based on SCr of 1.12 mg/dL). Liver Function Tests: Recent Labs  Lab 06/05/22 1518  AST 26  ALT 23  ALKPHOS 94  BILITOT 2.3*  PROT 6.1*  ALBUMIN 3.4*   No results for input(s): "LIPASE", "AMYLASE" in the last 168 hours. No results for input(s): "AMMONIA" in the last 168 hours. Coagulation Profile: Recent Labs  Lab 06/05/22 1657 06/06/22 0406  INR 1.6* 1.5*   Cardiac Enzymes: No results for input(s): "CKTOTAL", "CKMB", "CKMBINDEX", "TROPONINI"  in the last 168 hours. BNP (last 3 results) No results for input(s): "PROBNP" in the last 8760 hours. HbA1C: No results for input(s): "HGBA1C" in the last 72 hours. CBG: No results for input(s): "GLUCAP" in the last 168 hours. Lipid Profile: No results for input(s): "CHOL", "HDL", "LDLCALC", "TRIG", "CHOLHDL", "LDLDIRECT" in the last 72 hours. Thyroid Function Tests: No results for input(s): "TSH", "T4TOTAL", "FREET4", "T3FREE", "THYROIDAB" in the last 72 hours. Anemia Panel: No results for input(s): "VITAMINB12", "FOLATE", "FERRITIN", "TIBC", "IRON", "RETICCTPCT" in the last 72 hours. Sepsis Labs: No results for input(s): "PROCALCITON", "LATICACIDVEN" in the last 168 hours.  No results found for this or any previous visit (from the past 240 hour(s)).       Radiology Studies: No results found.      Scheduled Meds:  atorvastatin  40 mg Oral Daily   clopidogrel  75 mg Oral Daily   pantoprazole (PROTONIX) IV  40 mg Intravenous Q12H   Continuous Infusions:  ferric gluconate  (FERRLECIT) IVPB 250 mg (06/08/22 0942)     LOS: 3 days    Time spent: 46 minutes spent on chart review, discussion with nursing staff, consultants, updating family and interview/physical exam; more than 50% of that time was spent in counseling and/or coordination of care.    Bradford Cazier J [place] DO [facility] Available via Epic secure chat 7am-7pm After these hours, please refer to coverage provider listed on [email or web address] 06/08/2022, 12:06 PM

## 2022-06-08 NOTE — Progress Notes (Addendum)
[facility] Progress Note  CC: Iron deficiency anemia, melena  Subjective: He is tolerating a soft diet.  No nausea or vomiting.  No abdominal pain.  He passed a solid black stool yesterday around 3 PM.  No BM yet today.  He had a mild cough following his EGD which has abated.  No chest pain or shortness of breath.  His wife is at the bedside.  Objective:  Vital signs in last 24 hours: Temp:  [97.8 F (36.6 C)-98.8 F (37.1 C)] 98.2 F (36.8 C) (07/10 0555) Pulse Rate:  [59-72] 68 (07/10 0555) Resp:  [16-21] 21 (07/10 0555) BP: (110-141)/(48-94) 110/94 (07/10 0555) SpO2:  [95 %-99 %] 97 % (07/10 0555) Weight:  [88.4 kg] 88.4 kg (07/10 0500) Last BM Date : 06/07/22 General: Alert 76 year old male in no acute distress Heart: Irregular rhythm, systolic murmur. Pulm: Breath sounds clear throughout Abdomen: Soft, nondistended.  Nontender.  Positive bowel sounds to all 4 quadrants. Extremities:  Without edema. Neurologic:  Alert and  oriented x 4. Grossly normal neurologically. Psych:  Alert and cooperative. Normal mood and affect.  Lab Results: Recent Labs    06/06/22 0406 06/06/22 1239 06/07/22 0400 06/07/22 1140 06/07/22 1956 06/08/22 0357  WBC 5.3  --  16.5*  --   --  9.2  HGB 7.2*   < > 8.4* 8.6* 8.5* 7.8*  HCT 22.9*   < > 26.4* 26.7* 26.9* 24.8*  PLT 196  --  222  --   --  194   < > = values in this interval not displayed.   BMET Recent Labs    06/05/22 1518 06/06/22 0406 06/07/22 1140 06/08/22 0357  NA 137 138  --  140  K 3.6 3.8  --  3.5  CL 111 109  --  111  CO2 21* 23  --  23  GLUCOSE 144* 100*  --  103*  BUN 29* 29* 22 21  CREATININE 1.06 0.98  --  1.12  CALCIUM 8.8* 8.5*  --  8.4*   LFT Recent Labs    06/05/22 1518  PROT 6.1*  ALBUMIN 3.4*  AST 26  ALT 23  ALKPHOS 94  BILITOT 2.3*   PT/INR Recent Labs    06/05/22 1657 06/06/22 0406  LABPROT 18.9* 18.1*  INR 1.6* 1.5*   Hepatitis Panel No results for input(s):  "HEPBSAG", "HCVAB", "HEPAIGM", "HEPBIGM" in the last 72 hours.  No results found.  Assessment / Plan:  65) 76 year old male admitted to the hospital 06/05/2022 with IDA, upper GI bleed/melena when on Eliquis, Plavix and aspirin. Admission Hg 6.6 -> transfused 2 units of PRBCs -> 7.2 -> 9.0 -> 8.4. 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 Hg dropped to 7.8 this am. One unit of PRBCs ordered by the hospitalist. He passed a solid black BM yesterday afternoon. Hemodynamically stable.  -Continue IV Ferrlecit infusions  -Continue Pantoprazole 40 mg IV twice daily -Agree with PRBC transfusion -Check H&H posttransfusion -Continue to monitor the patient for active GI bleed  2) Chronic atrial fibrillation -Continue to hold Eliquis and ASA  3) CAD status post CABG with recent coronary stenting.  Plavix was restarted on 06/07/2022.  4) Stomach polyps per small bowel enteroscopy, one of which potentially adenomatous, consideration for surveillance EGD as an outpatient once he is off Plavix in 6 months     LOS: 3 days   Noralyn Pick  06/08/2022, 08:45AM  _______________________________________________________________________________________________________________________________________  [facility] MD note:  I personally examined the patient, reviewed the data and agree with the assessment and plan described above.  I provided a substantive portion of the care of this patient (personally provided more than half of the total time dedicated to the treatment of this patient).  Cardiology seems to feel that he will be OK on plavix monotherapy going forward.  He will clearly be at lower risk of rebleeding since ASA and eliquis may not need to be restarted.  Will follow along. He might be safe for d/c tomorrow. He'll need a second iron infusion (can be as outpatient if needed) and we'll follow his Hb serially as an  outpatient.  I've changed diet to heart healthy.  He does not need Hb at 6:30 PM, the AM CBC should suffice.   Owens Loffler, MD [facility] Pager [phone]

## 2022-06-08 NOTE — Consult Note (Signed)
Cardiology Consultation:   Patient ID: Russell Griffith MRN: [id number]; DOB: [birth year]  Admit date: 06/05/2022 Date of Consult: 06/08/2022  PCP:  Cassandria Anger, MD   [facility] Providers Cardiologist:  Minus Breeding, MD  Electrophysiologist:  Virl Axe, MD  {  Patient Profile:   Russell Griffith is a 76 y.o. male with a history of CAD s/p CABG x5 in 2000, permanent atrial fibrillation s/p ablation x2 on Eliquis, sinus node dysfunction s/p PPM, carotid artery disease, mild aortic stenosis, chronic dizziness with no clear etiology, hypertension, hyperlipidemia, GERD with Barrett's esophagus, tubular adenoma of colon who is being seen today for assistance with antiplatelet and anticoagulation therapy given recent PCI and current GI bleed at the request of Dr. [place]  History of Present Illness:   Russell Griffith is a 75 year old male with the above history who is followed by Dr. Percival Spanish Dr. Caryl Comes. Last Echo in 06/2021 showed EF of 65-70% with normal wall motion, normal RV with mildly reduced systolic function, mild aortic stenosis, and mildly elevated RVSP of 40.5 mmHg.  Patient was seen by Dr. Percival Spanish on 05/13/2022 at which time he reported shortness of breath with exertion as well as some discomfort in his lower epigastric area but no real chest pain.  Decision was made to proceed with outpatient right/left cardiac catheterization for further evaluation.  This was done on 05/22/2022 and showed patent LIMA to LAD and SVG to 1st Diag with known CTO of proximal SVG to RCA and proximal SVG to OM but a new 80% stenosis of mid SVG to left PDA as well as 75% stenosis of proximal LCx both of which were treated with successful PCI with DES.  Cath also showed mild to moderate pulmonary hypertension with mild volume overload.  Plan was for triple therapy with Aspirin, Plavix, and Eliquis for 1 month at which time Aspirin would be stopped.  Seen by Diona Browner, NP, on 05/29/2022 for follow-up  at which time he noted improvement in his shortness of breath.  He did report 2 days of rectal bleeding followed by dark stools after starting triple therapy.  This was discussed with Dr. Percival Spanish who recommended continuing aspirin for now but stopping if he had recurrent bleeding.  Patient presented to the ED on 06/05/2022 per recommendation from PCP for further evaluation of worsening anemia with hemoglobin of 6.6.  Hemoccult positive.  DAPT and Eliquis were held and GI was consulted.  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  He underwent fulguration to stop the bleeding by argon plasma which was successful and then placement of hemostasis clips.  Plavix was restarted on 06/07/2022.  Unfortunately, hemoglobin is dropped back down to 7.8 today (down from mid 8 range yesterday) after restarting Plavix.  Another unit of PRBC has been ordered.  Cardiology has been consulted to assist with recommendations regarding antiplatelet therapy and anticoagulation.  At the time of this evaluation, patient resting comfortably no acute distress.  He states he was having decreased exercise tolerance with fatigue and dyspnea on exertion as his hemoglobin was dropping but he feels well today.  He was able to walk around the whole unit twice but no significant shortness of breath.  He denies any chest pain/discomfort.  No orthopnea PND.  He has occasional lower extremity edema but this is stable.  No palpitations.  He states his chronic dizziness  has actually resolved.  No syncope.  He had a bowel movement earlier this morning and states that it was black with a little bit of bright red blood she states may be due to some known hemorrhoids.  He occasionally has mild epistasis but no other abnormal bleeding.  No recent fevers or illnesses.  No GI nausea or vomiting.  Wife is at bedside and we all  discussed the risk of bleeding versus stent restenosis.  Of note, he does have some non-pitting edema of his left arm which he states he noticed yesterday. He thinks this is due to the 2 blood transfusions. No erythema or pain. No history of DVT/PE.  Past Medical History:  Diagnosis Date   Anxiety    Aortic stenosis    Mild, echo, April, 2014   BPH (benign prostatic hyperplasia)    CAD (coronary artery disease)    a. 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 s/p PVI at [facility]   Precancerous lesion    Forehead   Primary osteoarthritis of left knee    Mild   Prolapsed internal hemorrhoids, grade 3 08/12/2015   PVC's (premature ventricular contractions)    Tubular adenoma of colon     Past Surgical History:  Procedure Laterality Date   ABLATION     PVI at [place] N/A 01/25/2019   Procedure: [place];  Surgeon: [name], MD;  Location: [place] [facility];  Service: Cardiovascular;  Laterality: N/A;   CARDIOVERSION N/A 10/14/2018   Procedure: CARDIOVERSION;  Surgeon: Fay Records, MD;  Location: [place];  Service: Cardiovascular;  Laterality: N/A;   CARDIOVERSION N/A 08/18/2019   Procedure: CARDIOVERSION;  Surgeon: Jerline Pain, MD;  Location: [facility] ENDOSCOPY;  Service: Cardiovascular;  Laterality: N/A;   COLONOSCOPY     CORONARY ARTERY BYPASS GRAFT  2000   CABG X5   CORONARY STENT INTERVENTION N/A 05/22/2022   Procedure: CORONARY STENT INTERVENTION;  Surgeon: Jettie Booze, MD;  Location: [place] [facility];  Service: Cardiovascular;  Laterality: N/A;   CYSTOSCOPY  WITH RETROGRADE PYELOGRAM, URETEROSCOPY AND STENT PLACEMENT Bilateral 09/04/2020   Procedure: CYSTOSCOPY WITH BILATERAL RETROGRADE PYELOGRAM, RIGHT URETEROSCOPY AND RIGHT  STENT PLACEMENT;  Surgeon: Franchot Gallo, MD;  Location: [facility];  Service: Urology;  Laterality: Bilateral;   ESOPHAGOGASTRODUODENOSCOPY     HEAD & NECK SKIN LESION EXCISIONAL BIOPSY     HEMORRHOID BANDING     INTRAVASCULAR PRESSURE WIRE/FFR STUDY N/A 01/05/2018   Procedure: INTRAVASCULAR PRESSURE WIRE/FFR STUDY;  Surgeon: Sherren Mocha, MD;  Location: [place] [facility];  Service: Cardiovascular;  Laterality: N/A;   INTRAVASCULAR ULTRASOUND/IVUS N/A 05/22/2022   Procedure: Intravascular Ultrasound/IVUS;  Surgeon: Jettie Booze, MD;  Location: [place] [facility];  Service: Cardiovascular;  Laterality: N/A;   LEFT HEART CATH AND CORS/GRAFTS ANGIOGRAPHY N/A 01/05/2018   Procedure: LEFT HEART CATH AND CORS/GRAFTS ANGIOGRAPHY;  Surgeon: Sherren Mocha, MD;  Location: [place] [facility];  Service: Cardiovascular;  Laterality: N/A;   PERMANENT  PACEMAKER INSERTION N/A 07/15/2012   Procedure: PERMANENT PACEMAKER INSERTION;  Surgeon: Deboraha Sprang, MD;  Location: [facility] CATH LAB;  Service: Cardiovascular;  Laterality: N/A;   RIGHT/LEFT HEART CATH AND CORONARY/GRAFT ANGIOGRAPHY N/A 05/22/2022   Procedure: RIGHT/LEFT HEART CATH AND CORONARY/GRAFT ANGIOGRAPHY;  Surgeon: Jettie Booze, MD;  Location: [place] [facility];  Service: Cardiovascular;  Laterality: N/A;   rotator cuff surgery Right 2017   TONSILLECTOMY  1956   WISDOM TOOTH EXTRACTION       Home Medications:  Prior to Admission medications   Medication Sig Start Date End Date Taking? Authorizing Provider  aspirin EC 81 MG tablet Take 1 tablet (81 mg total) by mouth daily. Swallow whole. 05/22/22 05/22/23 Yes Jettie Booze, MD  atorvastatin (LIPITOR) 40 MG tablet Take 1 tablet (40 mg total) by mouth daily. 05/22/22 11/18/22 Yes Cheryln Manly, NP   Cholecalciferol 25 MCG (1000 UT) capsule Take 1,000 Units by mouth daily with supper.    Yes [provider]  clopidogrel (PLAVIX) 75 MG tablet Take 1 tablet (75 mg total) by mouth daily. 05/22/22 05/22/23 Yes Jettie Booze, MD  ELIQUIS 5 MG TABS tablet TAKE 1 TABLET BY MOUTH TWICE DAILY. 08/25/21  Yes Deboraha Sprang, MD  famotidine (PEPCID) 40 MG tablet Take 1 tablet (40 mg total) by mouth daily. Patient taking differently: Take 40 mg by mouth daily as needed for heartburn or indigestion. 06/01/22  Yes [name], Evie Lacks, MD  isosorbide mononitrate (IMDUR) 60 MG 24 hr tablet Take 1 tablet (60 mg total) by mouth daily. 05/13/22  Yes Minus Breeding, MD  losartan (COZAAR) 25 MG tablet Take 0.5 tablets (12.5 mg total) by mouth daily. Patient taking differently: Take 25 mg by mouth daily. 02/05/22  Yes [name], Evie Lacks, MD  nitroGLYCERIN (NITROSTAT) 0.4 MG SL tablet Place 1 tablet (0.4 mg total) under the tongue every 5 (five) minutes as needed. 05/22/22  Yes Cheryln Manly, NP  NONFORMULARY OR COMPOUNDED ITEM Apply 1 application topically daily as needed (rash). Cetaphil + triamcinolone cream   Yes [provider]  pantoprazole (PROTONIX) 40 MG tablet Take 1 tablet (40 mg total) by mouth 2 (two) times daily. 06/01/22  Yes [name], Evie Lacks, MD    Inpatient Medications: Scheduled Meds:  sodium chloride   Intravenous Once   clopidogrel  75 mg Oral Daily   pantoprazole (PROTONIX) IV  40 mg Intravenous Q12H   Continuous Infusions:  ferric gluconate (FERRLECIT) IVPB 250 mg (06/08/22 0942)   PRN Meds: albuterol, mouth rinse  Allergies:    Allergies  Allergen Reactions   Spironolactone     Cramps, dizziness   Cayenne Other (See Comments)    Sweats w/paprika too   Niacin And Related Other (See Comments)    Upset stomach   Sulfa Antibiotics Itching and Rash    "have no idea; mother told me I was allergic to"    Sulfonamide Derivatives Other (See Comments)     "have no idea; mother told me I was allergic to"    Social History:   Social History   Socioeconomic History   Marital status: Married    Spouse name: Not on file   Number of children: 2   Years of education: 18   Highest education level: Not on file  Occupational History   Occupation: Technical brewer: [employer]    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  2 daughters-'75, '79; 1 grandchild; step-daughter and step grandson.  Regular exercise-yes, runs 1.5-2 mi 4x/wk, also eliptical      Patient signed a Designated Party Release to allow his wife, Russell Griffith, to have access to his medical records/ information.   Social Determinants of Health   Financial Resource Strain: Low Risk  (01/05/2022)   Overall Financial Resource Strain (CARDIA)    Difficulty of Paying Living Expenses: Not hard at all  Food Insecurity: No Food Insecurity (01/05/2022)   Hunger Vital Sign    Worried About Running Out of Food in the Last Year: Never true    Ran Out of Food in the Last Year: Never true  Transportation Needs: No Transportation Needs (01/05/2022)   PRAPARE - Hydrologist (Medical): No    Lack of Transportation (Non-Medical): No  Physical Activity: Sufficiently Active (01/05/2022)   Exercise Vital Sign    Days of Exercise per Week: 7 days    Minutes of Exercise per Session: 60 min  Stress: No Stress Concern Present (01/05/2022)   [place]     Feeling of Stress : Not at all  Social Connections: Socially Integrated (01/05/2022)   Social Connection and Isolation Panel [NHANES]    Frequency of Communication with Friends and Family: More than three times a week    Frequency of Social Gatherings with Friends and Family: More than three times a week    Attends Religious Services: More than 4 times per year    Active Member of [employer] or Organizations: Yes    Attends Music therapist: More than 4 times per year    Marital Status: Married  Human resources officer Violence: Not At Risk (01/05/2022)   Humiliation, Afraid, Rape, and Kick questionnaire    Fear of Current or Ex-Partner: No    Emotionally Abused: No    Physically Abused: No    Sexually Abused: No    Family History:   Family History  Problem Relation Age of Onset   Coronary artery disease Other 64   Diabetes Other    Multiple myeloma Mother    Diabetes Father    Renal Disease Father    Hyperlipidemia Other    Hypertension Other    Colon cancer Neg Hx    Esophageal cancer Neg Hx    Stomach cancer Neg Hx    Rectal cancer Neg Hx      ROS:  Please see the history of present illness.  Review of Systems  Constitutional:  Positive for malaise/fatigue. Negative for fever.  HENT:  Positive for nosebleeds (Occasionally). Negative for congestion.   Respiratory:  Positive for shortness of breath. Negative for cough and hemoptysis.   Cardiovascular:  Positive for leg swelling. Negative for chest pain, palpitations, orthopnea and PND.  Gastrointestinal:  Negative for blood in stool, melena, nausea and vomiting.  Genitourinary:  Negative for hematuria.  Musculoskeletal:  Negative for myalgias.  Neurological:  Negative for dizziness and loss of consciousness.  Endo/Heme/Allergies:  Bruises/bleeds easily (Bruises easily).  Psychiatric/Behavioral:  Negative for substance abuse.        Physical Exam/Data:   Vitals:   06/07/22 2138 06/08/22 0206 06/08/22 0500 06/08/22  0555  BP: 128/60 (!) 112/48  (!) 110/94  Pulse: 65 72  68  Resp: 18 19  (!) 21  Temp: 98.2 F (36.8 C) 98.8 F (37.1 C)  98.2 F (36.8 C)  TempSrc: Oral Oral  Oral  SpO2: 99% 95%  97%  Weight:   88.4 kg   Height:        Intake/Output Summary (Last 24 hours) at 06/08/2022 0947 Last data filed at 06/08/2022 9417 Gross per 24 hour  Intake 480 ml  Output --  Net 480 ml      06/08/2022    5:00 AM 06/06/2022    7:08 AM 06/01/2022    8:46 AM  Last 3 Weights  Weight (lbs) 194 lb 14.2 oz 190 lb 190 lb  Weight (kg) 88.4 kg 86.183 kg 86.183 kg     Body mass index is 27.18 kg/m.  General: 76 y.o. Caucasian male resting comfortably in no acute distress. HEENT: Normocephalic and atraumatic.  Neck: Supple.  No JVD. Heart: Irregularly irregular rhythm with normal rate. Distinct S1 and S2. Soft II/VI systolic murmur. No gallops or rubs. Lungs: No increased work of breathing. Clear to ausculation bilaterally. No wheezes, rhonchi, or rales.  Abdomen: Soft, non-distended, and non-tender to palpation. MSK: Normal strength and tone for age. Extremities: No significant lower extremity edema. Non-pitting edema of left arm following transfusions. Skin: Warm and dry. Neuro: Alert and oriented x3. No focal deficits. Psych: Normal affect. Responds appropriately.   EKG:  The EKG was personally reviewed and demonstrates:  Atrial fibrillation, rate 69 bpm, with nonspecific T wave changes.  Normal axis.  QTc 458 ms. Telemetry:  Telemetry was personally reviewed and demonstrates:  Atrial fibrillation with occasional PVCs and ventricular couplets. Rates ranging from the 60s to 90s.  Relevant CV Studies:  Right/Left Cardiac Catheterization 05/22/2022:   Mid LAD to Dist LAD lesion is 50% stenosed.  SVG to diagonal is patent.   Ost LAD to Prox LAD lesion is 100% stenosed.  LIMA to LAD is patent.   Prox Cx-1 lesion is 75% stenosed.  SVG to proximal OM is occluded.  A drug-eluting stent was successfully placed  into the native circumflex using a SYNERGY XD 4.0X16, postdilated to 4.5 mm and optimized with IVUS.   Post intervention, there is a 0% residual stenosis.   Prox Cx-2 lesion is 50% stenosed. Unchanged from prior.   Dist Cx lesion is 100% stenosed.  SVG to left PDA with 80% mid graft lesion.  A drug-eluting stent was successfully placed using a SYNERGY XD 3.50X16.   Post intervention, there is a 0% residual stenosis.   Prox RCA lesion is 100% stenosed.  SVG to RCA is occluded.  Known from prior.   Ost 1st Mrg to 1st Mrg lesion is 40% stenosed.   A drug-eluting stent was successfully placed using a SYNERGY XD 4.0X16.   Aortic saturation 98%, PA saturation 75%, PA pressure 43/29, mean PA pressure 35 mmHg, mean pulmonary capillary wedge pressure 24 mmHg, cardiac output 7.2 L/min, cardiac index 3.49.   The left ventricular systolic function is normal.   LV end diastolic pressure is mildly elevated.   The left ventricular ejection fraction is 55-65% by visual estimate.   Hemodynamic findings consistent with mild to moderate pulmonary hypertension.   There is  no aortic valve stenosis.   Mild to moderate pulmonary hypertension noted on right heart cath.  Mild volume overload.  May need to consider sleep apnea evaluation as well.  Successful PCI of the SVG to left PDA with a 3.5 x 16 Synergy drug-eluting stent.  Successful PCI of the proximal circumflex with a 4.0 x 16 Synergy drug-eluting stent, postdilated to 4.5 mm in diameter.  Patient tolerated the procedure well.   Of note, he did have some bradycardia despite having a pacemaker, with heart rates into the upper 30s.  After the procedure, he informed us that his ventricular lead has been turned off and his pacemaker may be removed.   Diagnostic Dominance: Left  Intervention    Laboratory Data:  High Sensitivity Troponin:  No results for input(s): "TROPONINIHS" in the last 720 hours.   Chemistry Recent Labs  Lab 06/05/22 1518  06/06/22 0406 06/07/22 1140 06/08/22 0357  NA 137 138  --  140  K 3.6 3.8  --  3.5  CL 111 109  --  111  CO2 21* 23  --  23  GLUCOSE 144* 100*  --  103*  BUN 29* 29* 22 21  CREATININE 1.06 0.98  --  1.12  CALCIUM 8.8* 8.5*  --  8.4*  GFRNONAA >60 >60  --  >60  ANIONGAP 5 6  --  6    Recent Labs  Lab 06/05/22 1518  PROT 6.1*  ALBUMIN 3.4*  AST 26  ALT 23  ALKPHOS 94  BILITOT 2.3*   Lipids No results for input(s): "CHOL", "TRIG", "HDL", "LABVLDL", "LDLCALC", "CHOLHDL" in the last 168 hours.  Hematology Recent Labs  Lab 06/06/22 0406 06/06/22 1239 06/07/22 0400 06/07/22 1140 06/07/22 1956 06/08/22 0357  WBC 5.3  --  16.5*  --   --  9.2  RBC 2.69*  --  3.08*  --   --  2.87*  HGB 7.2*   < > 8.4* 8.6* 8.5* 7.8*  HCT 22.9*   < > 26.4* 26.7* 26.9* 24.8*  MCV 85.1  --  85.7  --   --  86.4  MCH 26.8  --  27.3  --   --  27.2  MCHC 31.4  --  31.8  --   --  31.5  RDW 15.9*  --  16.5*  --   --  16.7*  PLT 196  --  222  --   --  194   < > = values in this interval not displayed.   Thyroid No results for input(s): "TSH", "FREET4" in the last 168 hours.  BNPNo results for input(s): "BNP", "PROBNP" in the last 168 hours.  DDimer No results for input(s): "DDIMER" in the last 168 hours.   Radiology/Studies:  DG Chest Port 1 View  Result Date: 06/05/2022 CLINICAL DATA:  Anemia EXAM: PORTABLE CHEST 1 VIEW COMPARISON:  02/29/2020 chest radiograph. FINDINGS: Intact sternotomy wires. Stable configuration of 2 lead left subclavian pacemaker. CABG clips overlie the left mediastinum. Stable cardiomediastinal silhouette with borderline mild cardiomegaly. No pneumothorax. No pleural effusion. Lungs appear clear, with no acute consolidative airspace disease and no pulmonary edema. IMPRESSION: Borderline mild cardiomegaly without pulmonary edema. No active pulmonary disease. Electronically Signed   By: Ilona Sorrel M.D.   On: 06/05/2022 17:37     Assessment and Plan:   CAD s/p CABG S/p  remote CABG x5 in 2000. He recent underwent R/LHC on 05/22/2022 and had 2 new stents place (DES to SVG to left PDA  and DES to native LCX). Plan was for triple therapy with Aspirin, Plavix, and Eliquis for 1 month at which time Aspirin can be stopped.Now presents with acute GI bleed (hemoglobin as low as 6.6. He has required 2 units of PRBCs so far. He underwent treatment of bleeding diuelafoy lesion on 06/06/2022. Hemoglobin dropped again today to 7.8 after restarting Plavix yesterday. We were asked to assist with recommendations for antiplatelet therapy.  - No chest pain. - Discussed the risk of in-stent restenosis vs bleeding risk. Given PCI less than 1 month ago, would continue Plavix if at all possible. Would not restart Aspirin. Continue to hold Eliquis for now. Will discuss with MD. - Continue high-intensity statin.  Permanent Atrial Fibrillation S/p ablation x2. - Rate controlled. - Continue to hold Eliquis for now given active GI bleed. Restart when OK by GI.  Sinus Node Dysfunction S/p PPM PPM is nearing ERI; however, there has been talks about not replacing this given persistent atrial fibrillation. - Followed by EP.  Hypertension BP soft at times but stable. - Continue to hold home Imdur and Losartan at this time given soft BP at times. Can likely restart at discharge (or before if BP trends up).  Hyperlipidemia - Continue Lipitor 25m daily.   Acute Anemia Active GI Bleed Occurred after starting triple therapy following recent PCI on 05/22/2022. Hemoglobin as low as 6.6. S/p 2 units of PRBC so far. Enteroscopy on 7/8 showed diuelafoy lesion with active bleeding  in the second portion of the duodenum.  He underwent fulguration to stop the bleeding by argon plasma which was successful and then placement of hemostasis clips.  Plavix was restarted on 06/07/2022. Unfortunately, hemoglobin down to 7.8 again today. Another unit of PRBCs has been ordered. - Would ideally like to continue Plavix  if possible given recent PCI.  - Continue to hold Eliquis for now. Restart when OK with GI. - No plans to restart Aspirin.  - Management per GI.   Risk Assessment/Risk Scores:    CHA2DS2-VASc Score = 3  This indicates a 3.2% annual risk of stroke. The patient's score is based upon: CHF History: 0 HTN History: 1 Diabetes History: 0 Stroke History: 0 Vascular Disease History: 1 Age Score: 1 Gender Score: 0  For questions or updates, please contact [facility] Please consult [email or web address] for contact info under    Signed, CDarreld Mclean PA-C  06/08/2022 9:47 AM

## 2022-06-08 NOTE — TOC Initial Note (Signed)
Transition of Care [facility]) - Initial/Assessment Note    Patient Details  Name: Russell Griffith MRN: [id number] Date of Birth: 06/09/1946  Transition of Care [facility]) CM/SW Contact:    Leeroy Cha, RN Phone Number: 06/08/2022, 12:40 PM  Clinical Narrative:                  Transition of Care [facility]) Screening Note   Patient Details  Name: Russell Griffith Date of Birth: Nov 04, 1946   Transition of Care [facility]) CM/SW Contact:    Leeroy Cha, RN Phone Number: 06/08/2022, 12:40 PM    Transition of Care Department [facility]) has reviewed patient and no TOC needs have been identified at this time. We will continue to monitor patient advancement through interdisciplinary progression rounds. If new patient transition needs arise, please place a TOC consult.    Expected Discharge Plan: Home/Self Care Barriers to Discharge: Continued Medical Work up   Patient Goals and CMS Choice Patient states their goals for this hospitalization and ongoing recovery are:: to go home CMS Medicare.gov Compare Post Acute Care list provided to:: Patient Choice offered to / list presented to : Patient  Expected Discharge Plan and Services Expected Discharge Plan: Home/Self Care   Discharge Planning Services: CM Consult   Living arrangements for the past 2 months: Single Family Home                                      Prior Living Arrangements/Services Living arrangements for the past 2 months: Single Family Home Lives with:: Spouse Patient language and need for interpreter reviewed:: Yes Do you feel safe going back to the place where you live?: Yes            Criminal Activity/Legal Involvement Pertinent to Current Situation/Hospitalization: No - Comment as needed  Activities of Daily Living Home Assistive Devices/Equipment: None ADL Screening (condition at time of admission) Patient's cognitive ability adequate to safely complete daily activities?: Yes Is the patient  deaf or have difficulty hearing?: No Does the patient have difficulty seeing, even when wearing glasses/contacts?: No Does the patient have difficulty concentrating, remembering, or making decisions?: No Patient able to express need for assistance with ADLs?: Yes Does the patient have difficulty dressing or bathing?: No Independently performs ADLs?: Yes (appropriate for developmental age) Does the patient have difficulty walking or climbing stairs?: No Weakness of Legs: None Weakness of Arms/Hands: None  Permission Sought/Granted                  Emotional Assessment Appearance:: Appears stated age     Orientation: : Oriented to Self, Oriented to Place, Oriented to  Time, Oriented to Situation Alcohol / Substance Use: Alcohol Use    Admission diagnosis:  Acute upper gastrointestinal bleeding [K92.2] Melena [K92.1] Upper GI bleed [K92.2] Patient Active Problem List   Diagnosis Date Noted   Dieulafoy lesion of duodenum    Upper GI bleed 06/05/2022   Symptomatic anemia 02/05/2022   Lightheadedness 02/05/2022   Chronic diastolic heart failure ([place]) 02/02/2022   Atypical atrial flutter ([place]) 02/02/2022   Jaundice 01/27/2022   Fatigue 06/09/2021   Biceps tendinopathy, left 04/29/2021   Pain in left shoulder 01/09/2021   Lymphadenopathy 09/23/2020   Abnormal LFTs 09/19/2020   Acute prostatitis with hematuria 08/16/2020   Gross hematuria 08/12/2020   Bloating 05/22/2020   Supraclavicular adenopathy 02/29/2020   Left shoulder pain 11/12/2016  Pain of left thumb 11/12/2016   Epigastric pain 10/29/2015   Prolapsed internal hemorrhoids, grade 3 08/12/2015   IT band syndrome 07/23/2015   Partial tear of rotator cuff 12/04/2014   Antiplatelet or antithrombotic long-term use 07/03/2014   Dyslipidemia 03/06/2014   Ejection fraction    Diverticulitis of colon without hemorrhage 08/31/2013   Mitral regurgitation    Tricuspid regurgitation    HX: anticoagulation    Diffuse  anterior T-wave inversions 10/24/2012   PPM-[employer] 07/16/2012   Sinus node dysfunction/chronotropic incompetence/posttermination pausing 07/13/2012   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[place])    Shortness of breath    Aortic stenosis    Plantar fasciitis of right foot 04/04/2012   Numbness of toes    CAD (coronary artery disease)    Hx of CABG    Drug therapy    HTN (hypertension)    Drug intolerance    Pulmonic stenosis    Aortic insufficiency    Anticoagulated    Hypokalemia    A-fib (HCC)    QT prolongation on Tikosyn 03/26/2011   PVC's (premature ventricular contractions)    Carotid artery disease (HCC)    Elevated bilirubin    Abnormal TSH 04/01/2010   DERANGEMENT OF MENISCUS NOT ELSEWHERE CLASSIFIED 12/03/2009   Knee pain, right anterior 12/03/2009   BENIGN PROSTATIC HYPERTROPHY, WITH OBSTRUCTION 09/30/2009   Disorders of bursae and tendons in shoulder region, unspecified 08/22/2009   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   ADENOMATOUS COLONIC POLYP 03/06/2008   ANXIETY DEPRESSION 03/06/2008   ESOPHAGEAL STENOSIS 03/06/2008   BARRETTS ESOPHAGUS 03/06/2008   GASTRITIS, CHRONIC 03/06/2008   DUODENITIS 03/06/2008   PROSTATITIS, HX OF 03/06/2008   OSTEOARTHRITIS, KNEE, LEFT 11/04/2007   FOOT PAIN, LEFT 11/04/2007   Hx of colonic polyp 08/09/2007   PCP:  Cassandria Anger, MD Pharmacy:   [facility] [name] [address] Phone: [phone] Fax: [phone]  [facility] [address] [name] [address] Phone: [phone] Fax: [phone]     Social Determinants of Health (SDOH) Interventions    Readmission Risk Interventions     No data to display

## 2022-06-08 NOTE — Care Management Important Message (Signed)
Important Message  Patient Details IM Letter given to the Patient. Name: Russell Griffith MRN: [id number] Date of Birth: 1946-03-18   Medicare Important Message Given:  Yes     Kerin Salen 06/08/2022, 12:19 PM

## 2022-06-09 ENCOUNTER — Encounter (HOSPITAL_COMMUNITY): Payer: Self-pay | Admitting: Gastroenterology

## 2022-06-09 DIAGNOSIS — Z955 Presence of coronary angioplasty implant and graft: Secondary | ICD-10-CM

## 2022-06-09 DIAGNOSIS — K922 Gastrointestinal hemorrhage, unspecified: Secondary | ICD-10-CM | POA: Diagnosis not present

## 2022-06-09 LAB — CBC
HCT: 27.9 % — ABNORMAL LOW (ref 39.0–52.0)
Hemoglobin: 8.9 g/dL — ABNORMAL LOW (ref 13.0–17.0)
MCH: 27.6 pg (ref 26.0–34.0)
MCHC: 31.9 g/dL (ref 30.0–36.0)
MCV: 86.4 fL (ref 80.0–100.0)
Platelets: 205 10*3/uL (ref 150–400)
RBC: 3.23 MIL/uL — ABNORMAL LOW (ref 4.22–5.81)
RDW: 16.4 % — ABNORMAL HIGH (ref 11.5–15.5)
WBC: 7.7 10*3/uL (ref 4.0–10.5)
nRBC: 0 % (ref 0.0–0.2)

## 2022-06-09 LAB — BASIC METABOLIC PANEL
Anion gap: 9 (ref 5–15)
BUN: 20 mg/dL (ref 8–23)
CO2: 23 mmol/L (ref 22–32)
Calcium: 8.5 mg/dL — ABNORMAL LOW (ref 8.9–10.3)
Chloride: 109 mmol/L (ref 98–111)
Creatinine, Ser: 1.05 mg/dL (ref 0.61–1.24)
GFR, Estimated: >60 mL/min (ref >60)
Glucose, Bld: 92 mg/dL (ref 70–99)
Potassium: 3.6 mmol/L (ref 3.5–5.1)
Sodium: 141 mmol/L (ref 135–145)

## 2022-06-09 LAB — TYPE AND SCREEN
ABO/RH(D): A NEG
Antibody Screen: NEGATIVE
Unit division: 0
Unit division: 0
Unit division: 0

## 2022-06-09 LAB — BPAM RBC
Blood Product Expiration Date: 202307182359
Blood Product Expiration Date: 202307182359
Blood Product Expiration Date: 202307262359
ISSUE DATE / TIME: 202307072317
ISSUE DATE / TIME: 202307080729
ISSUE DATE / TIME: 202307101217
Unit Type and Rh: 600
Unit Type and Rh: 600
Unit Type and Rh: 600

## 2022-06-09 LAB — HEMOGLOBIN AND HEMATOCRIT, BLOOD
HCT: 25.1 % — ABNORMAL LOW (ref 39.0–52.0)
Hemoglobin: 8.1 g/dL — ABNORMAL LOW (ref 13.0–17.0)

## 2022-06-09 MED ORDER — PANTOPRAZOLE SODIUM 40 MG PO TBEC
40.0000 mg | DELAYED_RELEASE_TABLET | Freq: Two times a day (BID) | ORAL | Status: DC
  Administered 2022-06-09 – 2022-06-10 (×2): 40 mg via ORAL
  Filled 2022-06-09 (×2): qty 1

## 2022-06-09 NOTE — Progress Notes (Signed)
PROGRESS NOTE    Russell Griffith  HWE:[id number] DOB: [birth year] DOA: 06/05/2022 PCP: Cassandria Anger, MD    Brief Narrative:   Russell Griffith is a 76 y.o. male with past medical history significant for Gilbert's syndrome, CAD s/p CABG x5 with recent LHC with PCI/DES on 6/23 on DAPT, aortic stenosis, persistent atrial fibrillation on anticoagulation with Eliquis, HTN, HLD, BPH, history of tubular adenoma of colon, GERD/Barrett's esophagus who presented to [facility] ED on 7/7 by discretion of his PCP for dark tarry stools x5 days and anemia.  Patient has been monitoring patient's hemoglobin over the last week with a downward trend and given persistent black stools was sent to the ED for further evaluation.  Additionally patient reports dyspnea on exertion and fatigue.  Patient denies abdominal pain, no nausea/vomiting/diarrhea, no chest pain, no urinary symptoms.  In the ED, patient is afebrile, BP 153/68, HR 76, RR 14, SPO2 100% on room air.  Sodium 137, potassium 3.6, CO2 21, glucose 144, BUN 29, creatinine 1.06.  WBC 4.7, hemoglobin 6.6, platelets 192.0.  FOBT positive.  Chest x-ray with borderline mild cardiomegaly without pulmonary edema, no active cardiopulmonary disease process.  [facility] consulted.  TR[facility] consulted for further evaluation management of upper GI bleed.  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  On arrival, patient's hemoglobin noted to be 6.6 with positive FOBT.  [facility] [facility] was consulted and patient underwent upper endoscopy on 7/8 with findings of a single bleeding angiectasia in the duodenum treated with APC and hemoclips; additionally multiple gastric polyps noted which were not removed. --[facility] following, appreciate assistance --Hgb  6.6>7.0>6.4>7.2>9.0>8.5>8.4>8.6>8.5>7.8>8.9>8.1 --s/p 3u pRBC;  --s/p Ferrlecit 250 mg IV daily x2 --Protonix 40 mg IV PO BID --Restarted Plavix 7/9 --Continue to hold aspirin/Eliquis --Continue monitor on telemetry --repeat CBC in the AM  CAD s/p CABG and DES 6/23 Follows with cardiology outpatient, Dr. Percival Spanish.  Plan following stent was for dual antiplatelet therapy with aspirin/Plavix recommended for at least 1 month followed by discontinuing of aspirin and continuation of Plavix and Eliquis.  --Cardiology now signed off 7/11 --Plavix restarted 7/9 --Continue to hold aspirin/Eliquis for now; will hold per cardiology until outpatient follow-up with Dr. Percival Spanish  Essential hypertension On isosorbide mononitrate 60 mg p.o. daily and losartan 25 mg p.o. daily at home, will currently will hold given GI bleed. --Monitor BP closely  HLD: Atorvastatin 40 mg p.o. daily  GERD/Barrett's esophagus On Protonix 40 mg p.o. twice daily at home. --Continue PPI as above  History of Gilbert's syndrome Baseline bilirubin roughly 3.  Bilirubin 2.3 on admission, stable.   DVT prophylaxis: SCDs Start: 06/05/22 2035    Code Status: Full Code Family Communication: Updated spouse present at bedside this morning  Disposition Plan:  Level of care: Progressive Status is: Inpatient Remains inpatient appropriate because: Need further monitoring of hemoglobin to ensure stability before able to discharge home, anticipate likely tomorrow    Consultants:  [facility] gastroenterology Cardiology  Procedures:  Upper endoscopy, Dr. Havery Moros 7/8  Antimicrobials:  None   Subjective: Patient seen examined bedside, resting comfortably.  Lying in bed.  Spouse present.  Reported dark stool but more brown in appearance.  Hemoglobin up to 8.9 this morning but down trended to 8.1 this afternoon.  We will continue to monitor overnight with repeat hemoglobin in a.m.  No other questions or  concerns at this time.   Cardiology now signed off and recommend continue to hold aspirin and Eliquis until outpatient follow-up with Dr. Percival Spanish. Patient denies headache, no chest pain, no palpitations, no shortness of breath, no abdominal pain.  No acute concerns reported by nursing staff overnight.  Objective: Vitals:   06/08/22 2016 06/09/22 0629 06/09/22 0701 06/09/22 1258  BP: 126/62 124/72  110/67  Pulse: (!) 59 66  65  Resp:   19 18  Temp: 98.4 F (36.9 C) 98.2 F (36.8 C)  98.4 F (36.9 C)  TempSrc:    Oral  SpO2: 100% 96%  99%  Weight:      Height:        Intake/Output Summary (Last 24 hours) at 06/09/2022 1510 Last data filed at 06/09/2022 0300 Gross per 24 hour  Intake 1160.13 ml  Output 2 ml  Net 1158.13 ml   Filed Weights   06/06/22 0708 06/08/22 0500  Weight: 86.2 kg 88.4 kg    Examination:  Physical Exam: GEN: NAD, alert and oriented x 3, wd/wn HEENT: NCAT, PERRL, EOMI, sclera clear, MMM PULM: CTAB w/o wheezes/crackles, normal respiratory effort, on room air CV: IIR, normal rate, w/o M/G/R GI: abd soft, NTND, NABS, no R/G/M MSK: no peripheral edema, muscle strength globally intact 5/5 bilateral upper/lower extremities NEURO: CN II-XII intact, no focal deficits, sensation to light touch intact PSYCH: normal mood/affect Integumentary: dry/intact, no rashes or wounds    Data Reviewed: I have personally reviewed following labs and imaging studies  CBC: Recent Labs  Lab 06/05/22 0843 06/05/22 1630 06/05/22 2115 06/06/22 0406 06/06/22 1239 06/07/22 0400 06/07/22 1140 06/07/22 1956 06/08/22 0357 06/09/22 0357 06/09/22 1127  WBC 4.7 4.6  --  5.3  --  16.5*  --   --  9.2 7.7  --   NEUTROABS 2.7 2.8  --   --   --   --   --   --   --   --   --   HGB 6.6 Repeated and verified X2.* 7.0*   < > 7.2*   < > 8.4* 8.6* 8.5* 7.8* 8.9* 8.1*  HCT 20.0* 22.7*  --  22.9*   < > 26.4* 26.7* 26.9* 24.8* 27.9* 25.1*  MCV 79.9 85.3  --  85.1  --  85.7  --   --  86.4 86.4  --   PLT 192.0  210  --  196  --  222  --   --  194 205  --    < > = values in this interval not displayed.   Basic Metabolic Panel: Recent Labs  Lab 06/05/22 1518 06/06/22 0406 06/07/22 1140 06/08/22 0357 06/09/22 0357  NA 137 138  --  140 141  K 3.6 3.8  --  3.5 3.6  CL 111 109  --  111 109  CO2 21* 23  --  23 23  GLUCOSE 144* 100*  --  103* 92  BUN 29* 29* '22 21 20  '$ CREATININE 1.06 0.98  --  1.12 1.05  CALCIUM 8.8* 8.5*  --  8.4* 8.5*   GFR: Estimated Creatinine Clearance: 65.7 mL/min (by C-G formula based on SCr of 1.05 mg/dL). Liver Function Tests: Recent Labs  Lab 06/05/22 1518  AST 26  ALT 23  ALKPHOS 94  BILITOT 2.3*  PROT 6.1*  ALBUMIN 3.4*   No results for input(s): "LIPASE", "AMYLASE" in the last 168 hours. No results for input(s): "AMMONIA" in the last 168 hours. Coagulation Profile: Recent  Labs  Lab 06/05/22 1657 06/06/22 0406  INR 1.6* 1.5*   Cardiac Enzymes: No results for input(s): "CKTOTAL", "CKMB", "CKMBINDEX", "TROPONINI" in the last 168 hours. BNP (last 3 results) No results for input(s): "PROBNP" in the last 8760 hours. HbA1C: No results for input(s): "HGBA1C" in the last 72 hours. CBG: No results for input(s): "GLUCAP" in the last 168 hours. Lipid Profile: No results for input(s): "CHOL", "HDL", "LDLCALC", "TRIG", "CHOLHDL", "LDLDIRECT" in the last 72 hours. Thyroid Function Tests: No results for input(s): "TSH", "T4TOTAL", "FREET4", "T3FREE", "THYROIDAB" in the last 72 hours. Anemia Panel: No results for input(s): "VITAMINB12", "FOLATE", "FERRITIN", "TIBC", "IRON", "RETICCTPCT" in the last 72 hours. Sepsis Labs: No results for input(s): "PROCALCITON", "LATICACIDVEN" in the last 168 hours.  No results found for this or any previous visit (from the past 240 hour(s)).       Radiology Studies: No results found.      Scheduled Meds:  atorvastatin  40 mg Oral Daily   clopidogrel  75 mg Oral Daily   pantoprazole  40 mg Oral BID AC    Continuous Infusions:     LOS: 4 days    Time spent: 46 minutes spent on chart review, discussion with nursing staff, consultants, updating family and interview/physical exam; more than 50% of that time was spent in counseling and/or coordination of care.    Mykiah Schmuck J [place] DO [facility] Available via Epic secure chat 7am-7pm After these hours, please refer to coverage provider listed on [email or web address] 06/09/2022, 3:10 PM

## 2022-06-09 NOTE — Progress Notes (Addendum)
    H&H appear stable - would continue Plavix monotherapy for now. Follow-up with Dr. Percival Spanish or APP after discharge - would consider restarting Eliquis at that time. No further suggestions.  [facility] will sign off.   Medication Recommendations:  as above Other recommendations (labs, testing, etc):  none Follow up as an outpatient:  Dr. Percival Spanish or APP after discharge - has an appt with Hochrein on 8/21  Pixie Casino, MD, [facility], [place] Director of the Advanced Lipid Disorders &  Cardiovascular Risk Reduction Clinic Diplomate of the American Board of Clinical Lipidology Attending Cardiologist  Direct Dial: [phone]  Fax: [phone]  Website:  [email or web address]

## 2022-06-09 NOTE — Progress Notes (Addendum)
[facility] Progress Note  CC:   Iron deficiency anemia, melena  Subjective: He feels well.  He would like to go home.  He is tolerating a soft diet.  No nausea or vomiting.  No abdominal pain.  He passed a black solid stool with some brown components earlier today.  No bright red rectal bleeding.  No chest pain or shortness of breath.  Lab tech at the bedside to draw H&H.  Objective:  Vital signs in last 24 hours: Temp:  [97.7 F (36.5 C)-98.5 F (36.9 C)] 98.2 F (36.8 C) (07/11 0629) Pulse Rate:  [59-66] 66 (07/11 0629) Resp:  [19-20] 19 (07/11 0701) BP: (117-127)/(55-77) 124/72 (07/11 0629) SpO2:  [96 %-100 %] 96 % (07/11 0629) Last BM Date : 06/08/22 General:   Alert, well-developed in NAD Heart: Irregular rhythm, systolic murmur. Pulm: Breath sounds clear throughout. Abdomen: Soft, nondistended. Nontender.  Positive bowel sounds to all 4 quadrants Extremities:  Without edema. Neurologic:  Alert and  oriented x 4. Grossly normal neurologically. Psych:  Alert and cooperative. Normal mood and affect.  Lab Results: Recent Labs    06/07/22 0400 06/07/22 1140 06/07/22 1956 06/08/22 0357 06/09/22 0357  WBC 16.5*  --   --  9.2 7.7  HGB 8.4*   < > 8.5* 7.8* 8.9*  HCT 26.4*   < > 26.9* 24.8* 27.9*  PLT 222  --   --  194 205   < > = values in this interval not displayed.   BMET Recent Labs    06/07/22 1140 06/08/22 0357 06/09/22 0357  NA  --  140 141  K  --  3.5 3.6  CL  --  111 109  CO2  --  23 23  GLUCOSE  --  103* 92  BUN '22 21 20  '$ CREATININE  --  1.12 1.05  CALCIUM  --  8.4* 8.5*    Assessment / Plan:  97) 76 year old male admitted to the hospital 06/05/2022 with IDA, upper GI bleed/melena when on Eliquis, Plavix and aspirin. Admission Hg 6.6 -> transfused 2 units of PRBCs -> 7.2 -> 9.0 -> 8.4 -> 7.8 -> transfused 1 unit of PRBCs on 7/10 -> today Hg 8.9. S/P small bowel enteroscopy 06/06/2022 identified a bleeding dieulafoy lesion in his  duodenum which was treated endoscopically. He received high dose Ferrlecit '250mg'$  IV on 7/10 and today.  Repeat hemoglobin level collected at 11:30 am, results pending -Continue Pantoprazole 40 mg IV twice daily -Continue to monitor the patient for active GI bleed -Possible discharged later today  2) Chronic atrial fibrillation -Continue to hold Eliquis and ASA   3) CAD status post CABG with recent coronary stenting.  Plavix was restarted on 06/07/2022.   4) Stomach polyps per small bowel enteroscopy, one of which potentially adenomatous, consideration for surveillance EGD as an outpatient once he is off Plavix in 6 months     LOS: 4 days   Noralyn Pick  06/09/2022, 10:57 AM  _______________________________________________________________________________________________________________________________________  Velora Heckler GI MD note:  I personally examined the patient, reviewed the data and agree with the assessment and plan described above.  I provided a substantive portion of the care of this patient (personally provided more than half of the total time dedicated to the treatment of this patient.)  He had another dark stool today with some brown stool as well. He is tolerating amublation and diet. He would like to go home however an H/H was ordered and his Hb  is 8.1.  No clinical bleeding but his is understandably a bit nervous about going home since Hb 'downtrended.'  We discussed this and I think it is certainly reasonable to watch him overnight, repeat CBC in the morning and decide from there if he can go home. I will order the cbc and will change him to oral PPI BID as well.   Owens Loffler, MD [facility] Pager [phone]

## 2022-06-10 ENCOUNTER — Telehealth: Payer: Self-pay | Admitting: Nurse Practitioner

## 2022-06-10 DIAGNOSIS — R17 Unspecified jaundice: Secondary | ICD-10-CM

## 2022-06-10 DIAGNOSIS — R7989 Other specified abnormal findings of blood chemistry: Secondary | ICD-10-CM

## 2022-06-10 DIAGNOSIS — K922 Gastrointestinal hemorrhage, unspecified: Secondary | ICD-10-CM | POA: Diagnosis not present

## 2022-06-10 LAB — CBC
HCT: 28.6 % — ABNORMAL LOW (ref 39.0–52.0)
Hemoglobin: 9 g/dL — ABNORMAL LOW (ref 13.0–17.0)
MCH: 27.4 pg (ref 26.0–34.0)
MCHC: 31.5 g/dL (ref 30.0–36.0)
MCV: 86.9 fL (ref 80.0–100.0)
Platelets: 201 10*3/uL (ref 150–400)
RBC: 3.29 MIL/uL — ABNORMAL LOW (ref 4.22–5.81)
RDW: 16.5 % — ABNORMAL HIGH (ref 11.5–15.5)
WBC: 6.4 10*3/uL (ref 4.0–10.5)
nRBC: 0 % (ref 0.0–0.2)

## 2022-06-10 NOTE — Progress Notes (Signed)
Patient and patient's wife, Russell Griffith, have been read and explained discharge instructions. Patient has no further questions at this time. IV has been removed, and site is clean, dry and intact.   Layla Maw, RN

## 2022-06-10 NOTE — Plan of Care (Signed)
  Problem: Activity: Goal: Ability to return to baseline activity level will improve Outcome: Progressing   Problem: Cardiovascular: Goal: Ability to achieve and maintain adequate cardiovascular perfusion will improve Outcome: Progressing Goal: Vascular access site(s) Level 0-1 will be maintained Outcome: Progressing   Problem: Clinical Measurements: Goal: Ability to maintain clinical measurements within normal limits will improve Outcome: Progressing

## 2022-06-10 NOTE — Telephone Encounter (Signed)
Labs have been entered and appt has been made for 07/15/22 at 10 am with DJ. Letter mailed to the pt and sent to My Chart

## 2022-06-10 NOTE — TOC Progression Note (Signed)
Transition of Care [facility]) - Progression Note    Patient Details  Name: Russell Griffith MRN: [id number] Date of Birth: [birth year]  [facility]) CM/SW Contact  Leeroy Cha, RN Phone Number: 06/10/2022, 8:37 AM  Clinical Narrative:    [phone] chart reviewed.  Following for toc needs.  Plan is to return home with self-care at this time.   Expected Discharge Plan: Home/Self Care Barriers to Discharge: Continued Medical Work up  Expected Discharge Plan and Services Expected Discharge Plan: Home/Self Care   Discharge Planning Services: CM Consult   Living arrangements for the past 2 months: Single Family Home                                       Social Determinants of Health (SDOH) Interventions    Readmission Risk Interventions     No data to display

## 2022-06-10 NOTE — Telephone Encounter (Signed)
Russell Griffith, patient will be discharged from [facility] today.  Please contact him to facilitate her labs to include a CBC and BMP to be done in 1 week and schedule him for a follow-up appointment with Dr. Ardis Hughs in 3 to 4 weeks.  Thank you

## 2022-06-10 NOTE — Progress Notes (Addendum)
[facility] Progress Note  CC:   Iron deficiency anemia, melena  Subjective: He feels well today and he is ready to go home.  No bowel movement since yesterday.  He is tolerating a heart healthy diet.  No nausea or vomiting.  No abdominal pain.  No chest pain or shortness of breath.  His wife is at the bedside.   Objective:  Vital signs in last 24 hours: Temp:  [98.2 F (36.8 C)-98.4 F (36.9 C)] 98.3 F (36.8 C) (07/12 0618) Pulse Rate:  [61-68] 61 (07/12 0618) Resp:  [18-22] 18 (07/12 0618) BP: (110-132)/(60-70) 131/70 (07/12 0618) SpO2:  [95 %-99 %] 95 % (07/12 0618) Weight:  [88.1 kg] 88.1 kg (07/12 0500) Last BM Date : 06/08/22 General:   Alert, well-developed in NAD. Heart: Irregular rhythm, systolic murmur. Pulm: Breath sounds clear throughout. Abdomen: Soft, nontender. Extremities:  Without edema. Neurologic:  Alert and  oriented x 4. Grossly normal neurologically. Psych:  Alert and cooperative. Normal mood and affect.  Intake/Output from previous day: No intake/output data recorded. Intake/Output this shift: No intake/output data recorded.  Lab Results: Recent Labs    06/08/22 0357 06/09/22 0357 06/09/22 1127 06/10/22 0421  WBC 9.2 7.7  --  6.4  HGB 7.8* 8.9* 8.1* 9.0*  HCT 24.8* 27.9* 25.1* 28.6*  PLT 194 205  --  201   BMET Recent Labs    06/07/22 1140 06/08/22 0357 06/09/22 0357  NA  --  140 141  K  --  3.5 3.6  CL  --  111 109  CO2  --  23 23  GLUCOSE  --  103* 92  BUN '22 21 20  '$ CREATININE  --  1.12 1.05  CALCIUM  --  8.4* 8.5*   LFT No results for input(s): "PROT", "ALBUMIN", "AST", "ALT", "ALKPHOS", "BILITOT", "BILIDIR", "IBILI" in the last 72 hours. PT/INR No results for input(s): "LABPROT", "INR" in the last 72 hours. Hepatitis Panel No results for input(s): "HEPBSAG", "HCVAB", "HEPAIGM", "HEPBIGM" in the last 72 hours.  No results found.  Assessment / Plan:  75) 76 year old male admitted to the hospital  06/05/2022 with IDA, upper GI bleed/melena when on Eliquis, Plavix and aspirin. Admission Hg 6.6 -> transfused 2 units of PRBCs -> 7.2 -> 9.0 -> 8.4 -> 7.8 -> transfused 1 unit of PRBCs on 7/10 -> 8.9 -> today Hg up to 9.0. 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 He received high dose Ferrlecit '250mg'$  IV x 2.  No BM or melena overnight or this morning.  On Plavix. -Pantoprazole 40 mg p.o. twice daily  -Okay to discharge home today from GI standpoint -Our office will contact the patient to repeat CBC in 1 week and to schedule a follow-up appointment with Dr. Ardis Hughs in 3 to 4 weeks   2) Chronic atrial fibrillation -Continue to hold Eliquis and ASA   3) CAD status post CABG with recent coronary stenting.  Plavix was restarted on 06/07/2022.   4) Stomach polyps per small bowel enteroscopy, one of which potentially adenomatous, consideration for surveillance EGD as an outpatient once he is off Plavix in 6 months   -    LOS: 5 days   Noralyn Pick  06/10/2022, 9:17 AM   _______________________________________________________________________________________________________________________________________  Velora Heckler GI MD note:  He was discharged from the hospital before I was able to round on him.  I agree with all of the above.  Owens Loffler, MD [facility] Pager [phone]

## 2022-06-12 ENCOUNTER — Ambulatory Visit (INDEPENDENT_AMBULATORY_CARE_PROVIDER_SITE_OTHER): Payer: PPO | Admitting: Internal Medicine

## 2022-06-12 ENCOUNTER — Encounter: Payer: Self-pay | Admitting: Internal Medicine

## 2022-06-12 VITALS — BP 116/68 | HR 62 | Temp 98.2°F | Ht 71.0 in | Wt 197.4 lb

## 2022-06-12 DIAGNOSIS — Z8719 Personal history of other diseases of the digestive system: Secondary | ICD-10-CM | POA: Diagnosis not present

## 2022-06-12 DIAGNOSIS — Z09 Encounter for follow-up examination after completed treatment for conditions other than malignant neoplasm: Secondary | ICD-10-CM | POA: Diagnosis not present

## 2022-06-12 DIAGNOSIS — D649 Anemia, unspecified: Secondary | ICD-10-CM

## 2022-06-12 DIAGNOSIS — I4821 Permanent atrial fibrillation: Secondary | ICD-10-CM

## 2022-06-12 DIAGNOSIS — R0602 Shortness of breath: Secondary | ICD-10-CM

## 2022-06-12 MED ORDER — CEPHALEXIN 500 MG PO CAPS: 500.0000 mg | ORAL_CAPSULE | Freq: Four times a day (QID) | ORAL | 0 refills | Status: DC

## 2022-06-12 NOTE — Progress Notes (Signed)
Subjective:  Patient ID: Russell Griffith, male    DOB: 1946-04-11  Age: 76 y.o. MRN: [id number]  CC: Hospitalization Follow-up   HPI Russell Griffith presents for recent GI bleed, anemia  Outpatient Medications Prior to Visit  Medication Sig Dispense Refill   atorvastatin (LIPITOR) 40 MG tablet Take 1 tablet (40 mg total) by mouth daily. 90 tablet 1   Cholecalciferol 25 MCG (1000 UT) capsule Take 1,000 Units by mouth daily with supper.      clopidogrel (PLAVIX) 75 MG tablet Take 1 tablet (75 mg total) by mouth daily. 30 tablet 5   isosorbide mononitrate (IMDUR) 60 MG 24 hr tablet Take 1 tablet (60 mg total) by mouth daily. 90 tablet 3   losartan (COZAAR) 25 MG tablet Take 0.5 tablets (12.5 mg total) by mouth daily. (Patient taking differently: Take 25 mg by mouth daily.) 90 tablet 1   nitroGLYCERIN (NITROSTAT) 0.4 MG SL tablet Place 1 tablet (0.4 mg total) under the tongue every 5 (five) minutes as needed. 25 tablet 2   NONFORMULARY OR COMPOUNDED ITEM Apply 1 application topically daily as needed (rash). Cetaphil + triamcinolone cream     pantoprazole (PROTONIX) 40 MG tablet Take 1 tablet (40 mg total) by mouth 2 (two) times daily. 180 tablet 1   No facility-administered medications prior to visit.    ROS: Review of Systems  Constitutional:  Negative for appetite change, fatigue and unexpected weight change.  HENT:  Negative for congestion, nosebleeds, sneezing, sore throat and trouble swallowing.   Eyes:  Negative for itching and visual disturbance.  Respiratory:  Negative for cough.   Cardiovascular:  Negative for chest pain, palpitations and leg swelling.  Gastrointestinal:  Negative for abdominal distention, blood in stool, diarrhea and nausea.  Genitourinary:  Negative for frequency and hematuria.  Musculoskeletal:  Negative for back pain, gait problem, joint swelling and neck pain.  Skin:  Negative for rash.  Neurological:  Negative for dizziness, tremors, speech  difficulty, weakness and light-headedness.  Psychiatric/Behavioral:  Negative for agitation, dysphoric mood and sleep disturbance. The patient is not nervous/anxious.     Objective:  BP 116/68   Pulse 62   Temp 98.2 F (36.8 C) (Oral)   Ht '5\' 11"'$  (1.803 m)   Wt 197 lb 6 oz (89.5 kg)   SpO2 95%   BMI 27.53 kg/m   BP Readings from Last 3 Encounters:  07/20/22 100/60  07/16/22 128/72  06/25/22 111/62    Wt Readings from Last 3 Encounters:  07/20/22 189 lb (85.7 kg)  07/16/22 188 lb 6.4 oz (85.5 kg)  06/25/22 189 lb 12.8 oz (86.1 kg)    Physical Exam Constitutional:      General: He is not in acute distress.    Appearance: He is well-developed.     Comments: NAD  Eyes:     Conjunctiva/sclera: Conjunctivae normal.     Pupils: Pupils are equal, round, and reactive to light.  Neck:     Thyroid: No thyromegaly.     Vascular: No JVD.  Cardiovascular:     Rate and Rhythm: Normal rate. Rhythm irregular.     Heart sounds: Murmur heard.     No friction rub. No gallop.  Pulmonary:     Effort: Pulmonary effort is normal. No respiratory distress.     Breath sounds: Normal breath sounds. No wheezing, rhonchi or rales.  Chest:     Chest wall: No tenderness.  Abdominal:     General: Bowel sounds are  normal. There is no distension.     Palpations: Abdomen is soft. There is no mass.     Tenderness: There is no abdominal tenderness. There is no guarding or rebound.  Musculoskeletal:        General: No tenderness. Normal range of motion.     Cervical back: Normal range of motion.  Lymphadenopathy:     Cervical: No cervical adenopathy.  Skin:    General: Skin is warm and dry.     Findings: No rash.  Neurological:     Mental Status: He is alert and oriented to person, place, and time.     Cranial Nerves: No cranial nerve deficit.     Motor: No abnormal muscle tone.     Coordination: Coordination normal.     Gait: Gait normal.     Deep Tendon Reflexes: Reflexes are normal and  symmetric.  Psychiatric:        Behavior: Behavior normal.        Thought Content: Thought content normal.        Judgment: Judgment normal.    Red infiltrate R forearm Lab Results  Component Value Date   WBC 4.6 07/16/2022   HGB 11.1 (L) 07/16/2022   HCT 33.2 (L) 07/16/2022   PLT 158.0 07/16/2022   GLUCOSE 89 06/22/2022   CHOL 105 04/14/2022   TRIG 42.0 04/14/2022   HDL 31.60 (L) 04/14/2022   LDLCALC 65 04/14/2022   ALT 17 06/15/2022   AST 23 06/15/2022   NA 142 06/22/2022   K 3.8 06/22/2022   CL 108 06/22/2022   CREATININE 1.09 06/22/2022   BUN 17 06/22/2022   CO2 26 06/22/2022   TSH 4.55 09/29/2021   PSA 0.81 09/29/2021   INR 1.5 (H) 06/06/2022   HGBA1C 5.8 07/30/2015    DG Chest Port 1 View  Result Date: 06/05/2022 CLINICAL DATA:  Anemia EXAM: PORTABLE CHEST 1 VIEW COMPARISON:  02/29/2020 chest radiograph. FINDINGS: Intact sternotomy wires. Stable configuration of 2 lead left subclavian pacemaker. CABG clips overlie the left mediastinum. Stable cardiomediastinal silhouette with borderline mild cardiomegaly. No pneumothorax. No pleural effusion. Lungs appear clear, with no acute consolidative airspace disease and no pulmonary edema. IMPRESSION: Borderline mild cardiomegaly without pulmonary edema. No active pulmonary disease. Electronically Signed   By: Ilona Sorrel M.D.   On: 06/05/2022 17:37    Assessment & Plan:   Problem List Items Addressed This Visit     A-fib ([place])   Shortness of breath   Relevant Orders   CBC with Differential/Platelet (Completed)   Comprehensive metabolic panel (Completed)   Symptomatic anemia - Primary    Check CBC, iron  Start PO iron if tolerated FeSO4 vs Accrufer vs IV      Relevant Orders   CBC with Differential/Platelet (Completed)   Comprehensive metabolic panel (Completed)   Iron, TIBC and Ferritin Panel (Completed)      Meds ordered this encounter  Medications   DISCONTD: cephALEXin (KEFLEX) 500 MG capsule    Sig: Take  1 capsule (500 mg total) by mouth 4 (four) times daily.    Dispense:  28 capsule    Refill:  0      Follow-up: Return in about 6 weeks (around 07/24/2022) for a follow-up visit.  Walker Kehr, MD

## 2022-06-12 NOTE — Patient Instructions (Signed)
Arnica cream

## 2022-06-12 NOTE — Discharge Summary (Signed)
Physician Discharge Summary  Russell Griffith WUJ:[id number] DOB: 07-Oct-1946 DOA: 06/05/2022  PCP: Cassandria Anger, MD  Admit date: 06/05/2022 Discharge date: 06/10/22  Admitted From: Home Disposition: Home   Recommendations for Outpatient Follow-up:  Follow up with PCP in 1-2 weeks Please obtain BMP/CBC in one week Please follow up with GI Dr. Owens Loffler Home Health: None Equipment/Devices: None  Discharge Condition: Stable CODE STATUS:Full code Diet recommendation: Cardiac Brief/Interim Summary:Russell Griffith is a 76 y.o. male with past medical history significant for Gilbert's syndrome, CAD s/p CABG x5 with recent LHC with PCI/DES on 6/23 on DAPT, aortic stenosis, persistent atrial fibrillation on anticoagulation with Eliquis, HTN, HLD, BPH, history of tubular adenoma of colon, GERD/Barrett's esophagus who presented to [facility] ED on 7/7 by discretion of his PCP for dark tarry stools x5 days and anemia.  Patient has been monitoring patient's hemoglobin over the last week with a downward trend and given persistent black stools was sent to the ED for further evaluation.  Additionally patient reports dyspnea on exertion and fatigue.  Patient denies abdominal pain, no nausea/vomiting/diarrhea, no chest pain, no urinary symptoms.   In the ED, patient is afebrile, BP 153/68, HR 76, RR 14, SPO2 100% on room air.  Sodium 137, potassium 3.6, CO2 21, glucose 144, BUN 29, creatinine 1.06.  WBC 4.7, hemoglobin 6.6, platelets 192.0.  FOBT positive.  Chest x-ray with borderline mild cardiomegaly without pulmonary edema, no active cardiopulmonary disease process.  [place] gastroenterology consulted.  [facility] consulted for further evaluation management of upper GI bleed.  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  On arrival, patient's hemoglobin noted to be 6.6 with positive FOBT.  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  He received a total of 3 units of packed RBC during this stay.  He received 2 units of IV Feraheme.  He was treated with Protonix 40 IV twice daily.  Plavix was restarted 06/07/2022.  He was told to hold aspirin and Eliquis till seen by GI/cardiology   CAD s/p CABG and DES 6/23 Follows with cardiology outpatient, Dr. Percival Spanish.  Plan following stent was for dual antiplatelet therapy with aspirin/Plavix recommended for at least 1 month followed by discontinuing of aspirin and continuation of Plavix and Eliquis.  Patient was seen by cardiology during the hospital stay.   Essential hypertension-continue isosorbide mononitrate and losartan   HLD: Atorvastatin 40 mg p.o. daily   GERD/Barrett's esophagus On Protonix 40 mg p.o. twice daily at home.   History of Gilbert's syndrome Baseline bilirubin roughly 3.  Bilirubin 2.3 on admission, stable.    Estimated body mass index is 27.09 kg/m as calculated from the following:   Height as of this encounter: '5\' 11"'$  (1.803 m).   Weight as of this encounter: 88.1 kg.  Discharge Instructions  Discharge Instructions     Diet - low sodium heart healthy   Complete by: As directed    Increase activity slowly   Complete by: As directed       Allergies as of  06/10/2022       Reactions   Spironolactone    Cramps, dizziness   Cayenne Other (See Comments)   Sweats w/paprika too   Niacin And Related Other (See Comments)   Upset stomach   Sulfa Antibiotics Itching, Rash   "have no idea; mother told  me I was allergic to"   Sulfonamide Derivatives Other (See Comments)   "have no idea; mother told me I was allergic to"        Medication List     STOP taking these medications    Aspirin Low Dose 81 MG tablet Generic drug: aspirin EC   Eliquis 5 MG Tabs tablet Generic drug: apixaban   famotidine 40 MG tablet Commonly known as: Pepcid       TAKE these medications    atorvastatin 40 MG tablet Commonly known as: Lipitor Take 1 tablet (40 mg total) by mouth daily.   Cholecalciferol 25 MCG (1000 UT) capsule Take 1,000 Units by mouth daily with supper.   clopidogrel 75 MG tablet Commonly known as: Plavix Take 1 tablet (75 mg total) by mouth daily.   isosorbide mononitrate 60 MG 24 hr tablet Commonly known as: IMDUR Take 1 tablet (60 mg total) by mouth daily.   losartan 25 MG tablet Commonly known as: COZAAR Take 0.5 tablets (12.5 mg total) by mouth daily. What changed: how much to take   nitroGLYCERIN 0.4 MG SL tablet Commonly known as: NITROSTAT Place 1 tablet (0.4 mg total) under the tongue every 5 (five) minutes as needed.   NONFORMULARY OR COMPOUNDED ITEM Apply 1 application topically daily as needed (rash). Cetaphil + triamcinolone cream   pantoprazole 40 MG tablet Commonly known as: PROTONIX Take 1 tablet (40 mg total) by mouth 2 (two) times daily.        Follow-up Information     [name], Evie Lacks, MD Follow up.   Specialty: Internal Medicine Contact information: [name] [address] [phone]         Deboraha Sprang, MD .   Specialty: Cardiology Contact information: [phone] [place] [address] [phone]         Minus Breeding, MD .   Specialty: Cardiology Contact information: [address] [name] [address] [phone]                Allergies  Allergen Reactions   Spironolactone     Cramps, dizziness   Gretta Arab Other (See  Comments)    Sweats w/paprika too   Niacin And Related Other (See Comments)    Upset stomach   Sulfa Antibiotics Itching and Rash    "have no idea; mother told me I was allergic to"    Sulfonamide Derivatives Other (See Comments)    "have no idea; mother told me I was allergic to"    Consultations: GI and cardiology   Procedures/Studies: DG Chest Port 1 View  Result Date: 06/05/2022 CLINICAL DATA:  Anemia EXAM: PORTABLE CHEST 1 VIEW COMPARISON:  02/29/2020 chest radiograph. FINDINGS: Intact sternotomy wires. Stable configuration of 2 lead left subclavian pacemaker. CABG clips overlie the left mediastinum. Stable cardiomediastinal silhouette with borderline mild cardiomegaly. No pneumothorax. No pleural effusion. Lungs appear clear, with no acute consolidative airspace disease and no pulmonary edema. IMPRESSION: Borderline mild cardiomegaly without pulmonary edema. No active pulmonary disease. Electronically Signed   By: Ilona Sorrel M.D.   On: 06/05/2022 17:37   CARDIAC CATHETERIZATION  Result  Date: 05/22/2022   Mid LAD to Dist LAD lesion is 50% stenosed.  SVG to diagonal is patent.   Ost LAD to Prox LAD lesion is 100% stenosed.  LIMA to LAD is patent.   Prox Cx-1 lesion is 75% stenosed.  SVG to proximal OM is occluded.  A drug-eluting stent was successfully placed into the native circumflex using a SYNERGY XD 4.0X16, postdilated to 4.5 mm and optimized with IVUS.   Post intervention, there is a 0% residual stenosis.   Prox Cx-2 lesion is 50% stenosed. Unchanged from prior.   Dist Cx lesion is 100% stenosed.  SVG to left PDA with 80% mid graft lesion.  A drug-eluting stent was successfully placed using a SYNERGY XD 3.50X16.   Post intervention, there is a 0% residual stenosis.   Prox RCA lesion is 100% stenosed.  SVG to RCA is occluded.  Known from prior.   Ost 1st Mrg to 1st Mrg lesion is 40% stenosed.   A drug-eluting stent was successfully placed using a SYNERGY XD 4.0X16.   Aortic saturation  98%, PA saturation 75%, PA pressure 43/29, mean PA pressure 35 mmHg, mean pulmonary capillary wedge pressure 24 mmHg, cardiac output 7.2 L/min, cardiac index 3.49.   The left ventricular systolic function is normal.   LV end diastolic pressure is mildly elevated.   The left ventricular ejection fraction is 55-65% by visual estimate.   Hemodynamic findings consistent with mild to moderate pulmonary hypertension.   There is no aortic valve stenosis. Mild to moderate pulmonary hypertension noted on right heart cath.  Mild volume overload.  May need to consider sleep apnea evaluation as well. Successful PCI of the SVG to left PDA with a 3.5 x 16 Synergy drug-eluting stent. Successful PCI of the proximal circumflex with a 4.0 x 16 Synergy drug-eluting stent, postdilated to 4.5 mm in diameter.  Patient tolerated the procedure well. Of note, he did have some bradycardia despite having a pacemaker, with heart rates into the upper 30s.  After the procedure, he informed us that his ventricular lead has been turned off and his pacemaker may be removed.   (Echo, Carotid, EGD, Colonoscopy, ERCP)    Subjective:  Patient sitting by the side of the bed anxious to go home no further complaints Discharge Exam: Vitals:   06/09/22 2017 06/10/22 0618  BP: 132/60 131/70  Pulse: 68 61  Resp: (!) 22 18  Temp: 98.2 F (36.8 C) 98.3 F (36.8 C)  SpO2: 98% 95%   Vitals:   06/09/22 1258 06/09/22 2017 06/10/22 0500 06/10/22 0618  BP: 110/67 132/60  131/70  Pulse: 65 68  61  Resp: 18 (!) 22  18  Temp: 98.4 F (36.9 C) 98.2 F (36.8 C)  98.3 F (36.8 C)  TempSrc: Oral Oral  Oral  SpO2: 99% 98%  95%  Weight:   88.1 kg   Height:        General: Pt is alert, awake, not in acute distress Cardiovascular: RRR, S1/S2 +, no rubs, no gallops Respiratory: CTA bilaterally, no wheezing, no rhonchi Abdominal: Soft, NT, ND, bowel sounds + Extremities: no edema, no cyanosis    The results of significant diagnostics from  this hospitalization (including imaging, microbiology, ancillary and laboratory) are listed below for reference.     Microbiology: No results found for this or any previous visit (from the past 240 hour(s)).   Labs: BNP (last 3 results) No results for input(s): "BNP" in the last 8760 hours. Basic Metabolic Panel:  Recent Labs  Lab 06/06/22 0406 06/07/22 1140 06/08/22 0357 06/09/22 0357  NA 138  --  140 141  K 3.8  --  3.5 3.6  CL 109  --  111 109  CO2 23  --  23 23  GLUCOSE 100*  --  103* 92  BUN 29* '22 21 20  '$ CREATININE 0.98  --  1.12 1.05  CALCIUM 8.5*  --  8.4* 8.5*   Liver Function Tests: No results for input(s): "AST", "ALT", "ALKPHOS", "BILITOT", "PROT", "ALBUMIN" in the last 168 hours. No results for input(s): "LIPASE", "AMYLASE" in the last 168 hours. No results for input(s): "AMMONIA" in the last 168 hours. CBC: Recent Labs  Lab 06/05/22 1630 06/05/22 2115 06/06/22 0406 06/06/22 1239 06/07/22 0400 06/07/22 1140 06/07/22 1956 06/08/22 0357 06/09/22 0357 06/09/22 1127 06/10/22 0421  WBC 4.6  --  5.3  --  16.5*  --   --  9.2 7.7  --  6.4  NEUTROABS 2.8  --   --   --   --   --   --   --   --   --   --   HGB 7.0*   < > 7.2*   < > 8.4*   < > 8.5* 7.8* 8.9* 8.1* 9.0*  HCT 22.7*  --  22.9*   < > 26.4*   < > 26.9* 24.8* 27.9* 25.1* 28.6*  MCV 85.3  --  85.1  --  85.7  --   --  86.4 86.4  --  86.9  PLT 210  --  196  --  222  --   --  194 205  --  201   < > = values in this interval not displayed.   Cardiac Enzymes: No results for input(s): "CKTOTAL", "CKMB", "CKMBINDEX", "TROPONINI" in the last 168 hours. BNP: Invalid input(s): "POCBNP" CBG: No results for input(s): "GLUCAP" in the last 168 hours. D-Dimer No results for input(s): "DDIMER" in the last 72 hours. Hgb A1c No results for input(s): "HGBA1C" in the last 72 hours. Lipid Profile No results for input(s): "CHOL", "HDL", "LDLCALC", "TRIG", "CHOLHDL", "LDLDIRECT" in the last 72 hours. Thyroid function  studies No results for input(s): "TSH", "T4TOTAL", "T3FREE", "THYROIDAB" in the last 72 hours.  Invalid input(s): "FREET3" Anemia work up No results for input(s): "VITAMINB12", "FOLATE", "FERRITIN", "TIBC", "IRON", "RETICCTPCT" in the last 72 hours. Urinalysis    Component Value Date/Time   COLORURINE YELLOW 06/05/2022 2330   APPEARANCEUR CLEAR 06/05/2022 2330   LABSPEC 1.019 06/05/2022 2330   PHURINE 5.0 06/05/2022 2330   GLUCOSEU NEGATIVE 06/05/2022 2330   GLUCOSEU NEGATIVE 01/27/2022 1238   HGBUR NEGATIVE 06/05/2022 2330   HGBUR negative 09/19/2009 0925   BILIRUBINUR NEGATIVE 06/05/2022 2330   BILIRUBINUR - 08/12/2020 1526   KETONESUR NEGATIVE 06/05/2022 2330   PROTEINUR NEGATIVE 06/05/2022 2330   UROBILINOGEN 4.0 (A) 01/27/2022 1238   NITRITE NEGATIVE 06/05/2022 2330   LEUKOCYTESUR NEGATIVE 06/05/2022 2330   Sepsis Labs Recent Labs  Lab 06/07/22 0400 06/08/22 0357 06/09/22 0357 06/10/22 0421  WBC 16.5* 9.2 7.7 6.4   Microbiology No results found for this or any previous visit (from the past 240 hour(s)).   Time coordinating discharge: 39 minutes  SIGNED:   Georgette Shell, MD  [facility] 06/12/2022, 4:09 PM

## 2022-06-12 NOTE — Assessment & Plan Note (Addendum)
Check CBC, iron  Start PO iron if tolerated FeSO4 vs Accrufer vs IV

## 2022-06-15 ENCOUNTER — Other Ambulatory Visit (INDEPENDENT_AMBULATORY_CARE_PROVIDER_SITE_OTHER): Payer: PPO

## 2022-06-15 DIAGNOSIS — R0602 Shortness of breath: Secondary | ICD-10-CM

## 2022-06-15 DIAGNOSIS — D649 Anemia, unspecified: Secondary | ICD-10-CM

## 2022-06-15 LAB — COMPREHENSIVE METABOLIC PANEL
ALT: 17 U/L (ref 0–53)
AST: 23 U/L (ref 0–37)
Albumin: 3.7 g/dL (ref 3.5–5.2)
Alkaline Phosphatase: 115 U/L (ref 39–117)
BUN: 16 mg/dL (ref 6–23)
CO2: 25 mEq/L (ref 19–32)
Calcium: 8.7 mg/dL (ref 8.4–10.5)
Chloride: 107 mEq/L (ref 96–112)
Creatinine, Ser: 1.08 mg/dL (ref 0.40–1.50)
GFR: 67.64 mL/min (ref >60.00)
Glucose, Bld: 134 mg/dL — ABNORMAL HIGH (ref 70–99)
Potassium: 3.7 mEq/L (ref 3.5–5.1)
Sodium: 139 mEq/L (ref 135–145)
Total Bilirubin: 2.2 mg/dL — ABNORMAL HIGH (ref 0.2–1.2)
Total Protein: 5.6 g/dL — ABNORMAL LOW (ref 6.0–8.3)

## 2022-06-15 LAB — CBC WITH DIFFERENTIAL/PLATELET
Basophils Absolute: 0 10*3/uL (ref 0.0–0.1)
Basophils Relative: 0.6 % (ref 0.0–3.0)
Eosinophils Absolute: 0.1 10*3/uL (ref 0.0–0.7)
Eosinophils Relative: 2.4 % (ref 0.0–5.0)
HCT: 28.3 % — ABNORMAL LOW (ref 39.0–52.0)
Hemoglobin: 9.2 g/dL — ABNORMAL LOW (ref 13.0–17.0)
Lymphocytes Relative: 23.1 % (ref 12.0–46.0)
Lymphs Abs: 1 10*3/uL (ref 0.7–4.0)
MCHC: 32.6 g/dL (ref 30.0–36.0)
MCV: 84.2 fl (ref 78.0–100.0)
Monocytes Absolute: 0.6 10*3/uL (ref 0.1–1.0)
Monocytes Relative: 14.6 % — ABNORMAL HIGH (ref 3.0–12.0)
Neutro Abs: 2.5 10*3/uL (ref 1.4–7.7)
Neutrophils Relative %: 59.3 % (ref 43.0–77.0)
Platelets: 175 10*3/uL (ref 150.0–400.0)
RBC: 3.37 Mil/uL — ABNORMAL LOW (ref 4.22–5.81)
RDW: 18.4 % — ABNORMAL HIGH (ref 11.5–15.5)
WBC: 4.3 10*3/uL (ref 4.0–10.5)

## 2022-06-16 LAB — IRON,TIBC AND FERRITIN PANEL
%SAT: 10 % (calc) — ABNORMAL LOW (ref 20–48)
Ferritin: 103 ng/mL (ref 24–380)
Iron: 39 ug/dL — ABNORMAL LOW (ref 50–180)
TIBC: 399 mcg/dL (calc) (ref 250–425)

## 2022-06-17 ENCOUNTER — Other Ambulatory Visit: Payer: Self-pay | Admitting: Internal Medicine

## 2022-06-17 MED ORDER — ACCRUFER 30 MG PO CAPS: ORAL_CAPSULE | ORAL | 5 refills | Status: DC

## 2022-06-18 ENCOUNTER — Other Ambulatory Visit: Payer: PPO

## 2022-06-19 ENCOUNTER — Other Ambulatory Visit: Payer: Self-pay

## 2022-06-19 ENCOUNTER — Ambulatory Visit (INDEPENDENT_AMBULATORY_CARE_PROVIDER_SITE_OTHER): Payer: PPO

## 2022-06-19 DIAGNOSIS — I495 Sick sinus syndrome: Secondary | ICD-10-CM | POA: Diagnosis not present

## 2022-06-19 DIAGNOSIS — K921 Melena: Secondary | ICD-10-CM

## 2022-06-19 LAB — CUP PACEART REMOTE DEVICE CHECK
Battery Remaining Longevity: 12 mo
Battery Remaining Percentage: 13 %
Brady Statistic RA Percent Paced: 0 %
Brady Statistic RV Percent Paced: 0 %
Date Time Interrogation Session: 20230721013100
Implantable Lead Implant Date: 20130816
Implantable Lead Implant Date: 20130816
Implantable Lead Location: 753859
Implantable Lead Location: 753860
Implantable Lead Model: 5076
Implantable Lead Model: 5076
Implantable Pulse Generator Implant Date: 20130816
Lead Channel Impedance Value: 475 Ohm
Lead Channel Impedance Value: 893 Ohm
Lead Channel Pacing Threshold Amplitude: 0.9 V
Lead Channel Pacing Threshold Pulse Width: 0.4 ms
Lead Channel Setting Pacing Amplitude: 2 V
Lead Channel Setting Sensing Sensitivity: 2.5 mV
Pulse Gen Serial Number: 111255

## 2022-06-22 ENCOUNTER — Other Ambulatory Visit: Payer: Self-pay

## 2022-06-22 ENCOUNTER — Other Ambulatory Visit (HOSPITAL_COMMUNITY): Payer: Self-pay

## 2022-06-22 ENCOUNTER — Other Ambulatory Visit (INDEPENDENT_AMBULATORY_CARE_PROVIDER_SITE_OTHER): Payer: PPO

## 2022-06-22 DIAGNOSIS — K922 Gastrointestinal hemorrhage, unspecified: Secondary | ICD-10-CM

## 2022-06-22 DIAGNOSIS — R17 Unspecified jaundice: Secondary | ICD-10-CM | POA: Diagnosis not present

## 2022-06-22 DIAGNOSIS — K921 Melena: Secondary | ICD-10-CM | POA: Diagnosis not present

## 2022-06-22 DIAGNOSIS — R7989 Other specified abnormal findings of blood chemistry: Secondary | ICD-10-CM

## 2022-06-22 LAB — CBC WITH DIFFERENTIAL/PLATELET
Basophils Absolute: 0 10*3/uL (ref 0.0–0.1)
Basophils Relative: 0.9 % (ref 0.0–3.0)
Eosinophils Absolute: 0.1 10*3/uL (ref 0.0–0.7)
Eosinophils Relative: 1.8 % (ref 0.0–5.0)
HCT: 31.8 % — ABNORMAL LOW (ref 39.0–52.0)
Hemoglobin: 10.5 g/dL — ABNORMAL LOW (ref 13.0–17.0)
Lymphocytes Relative: 26.9 % (ref 12.0–46.0)
Lymphs Abs: 1 10*3/uL (ref 0.7–4.0)
MCHC: 32.8 g/dL (ref 30.0–36.0)
MCV: 83.3 fl (ref 78.0–100.0)
Monocytes Absolute: 0.7 10*3/uL (ref 0.1–1.0)
Monocytes Relative: 18.4 % — ABNORMAL HIGH (ref 3.0–12.0)
Neutro Abs: 2 10*3/uL (ref 1.4–7.7)
Neutrophils Relative %: 52 % (ref 43.0–77.0)
Platelets: 186 10*3/uL (ref 150.0–400.0)
RBC: 3.82 Mil/uL — ABNORMAL LOW (ref 4.22–5.81)
RDW: 18.1 % — ABNORMAL HIGH (ref 11.5–15.5)
WBC: 3.9 10*3/uL — ABNORMAL LOW (ref 4.0–10.5)

## 2022-06-22 LAB — BASIC METABOLIC PANEL
BUN: 17 mg/dL (ref 6–23)
CO2: 26 mEq/L (ref 19–32)
Calcium: 9.1 mg/dL (ref 8.4–10.5)
Chloride: 108 mEq/L (ref 96–112)
Creatinine, Ser: 1.09 mg/dL (ref 0.40–1.50)
GFR: 66.89 mL/min (ref >60.00)
Glucose, Bld: 89 mg/dL (ref 70–99)
Potassium: 3.8 mEq/L (ref 3.5–5.1)
Sodium: 142 mEq/L (ref 135–145)

## 2022-06-22 LAB — FERRITIN: Ferritin: 60.1 ng/mL (ref 22.0–322.0)

## 2022-06-23 NOTE — Progress Notes (Signed)
Cardiology Clinic Note   Patient Name: Russell Griffith Date of Encounter: 06/25/2022  Primary Care Provider:  Cassandria Anger, MD Primary Cardiologist:  Minus Breeding, MD  Patient Profile    Russell Griffith 76 year old male presents to the clinic today for follow-up evaluation of his atrial fibrillation and mitral valve regurgitation.  He is status post hospitalization for GI bleed 06/05/2022.  Past Medical History    Past Medical History:  Diagnosis Date   Anxiety    Aortic stenosis    Mild, echo, April, 2014   BPH (benign prostatic hyperplasia)    CAD (coronary artery disease)    a. 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 s/p PVI at [facility]   Precancerous lesion    Forehead   Primary osteoarthritis of left knee    Mild   Prolapsed internal hemorrhoids, grade 3 08/12/2015   PVC's (premature ventricular contractions)    Tubular adenoma of colon    Past Surgical History:  Procedure Laterality Date   ABLATION     PVI at [place] N/A 01/25/2019   Procedure: [place];  Surgeon: [name], MD;  Location: [place] [facility];  Service: Cardiovascular;  Laterality: N/A;   CARDIOVERSION N/A 10/14/2018   Procedure: CARDIOVERSION;  Surgeon: Fay Records, MD;  Location: [place];  Service: Cardiovascular;  Laterality: N/A;   CARDIOVERSION N/A 08/18/2019   Procedure: CARDIOVERSION;  Surgeon: Jerline Pain, MD;  Location: [facility] ENDOSCOPY;  Service: Cardiovascular;  Laterality: N/A;   COLONOSCOPY     CORONARY ARTERY BYPASS GRAFT  2000   CABG X5   CORONARY STENT  INTERVENTION N/A 05/22/2022   Procedure: CORONARY STENT INTERVENTION;  Surgeon: Jettie Booze, MD;  Location: [place] [facility];  Service: Cardiovascular;  Laterality: N/A;   CYSTOSCOPY WITH RETROGRADE PYELOGRAM, URETEROSCOPY AND STENT PLACEMENT Bilateral 09/04/2020   Procedure: CYSTOSCOPY WITH BILATERAL RETROGRADE PYELOGRAM, RIGHT URETEROSCOPY AND RIGHT  STENT PLACEMENT;  Surgeon: Franchot Gallo, MD;  Location: [facility];  Service: Urology;  Laterality: Bilateral;   ENTEROSCOPY N/A 06/06/2022   Procedure: ENTEROSCOPY;  Surgeon: Yetta Flock, MD;  Location: [facility] ENDOS[facility];  Service: Gastroenterology;  Laterality: N/A;   ESOPHAGOGASTRODUODENOSCOPY     HEAD & NECK SKIN LESION EXCISIONAL BIOPSY     HEMORRHOID BANDING     HEMOSTASIS CLIP PLACEMENT  06/06/2022   Procedure: HEMOSTASIS CLIP PLACEMENT;  Surgeon: Yetta Flock, MD;  Location: [facility] ENDOSCOPY;  Service: Gastroenterology;;   HOT HEMOSTASIS N/A 06/06/2022   Procedure: HOT HEMOSTASIS (ARGON PLASMA COAGULATION/BICAP);  Surgeon: Yetta Flock, MD;  Location: [facility];  Service: Gastroenterology;  Laterality: N/A;   INTRAVASCULAR PRESSURE WIRE/FFR STUDY N/A 01/05/2018   Procedure: INTRAVASCULAR PRESSURE WIRE/FFR STUDY;  Surgeon: Sherren Mocha, MD;  Location: [place] [facility];  Service: Cardiovascular;  Laterality: N/A;   INTRAVASCULAR ULTRASOUND/IVUS N/A 05/22/2022   Procedure: Intravascular Ultrasound/IVUS;  Surgeon: Jettie Booze, MD;  Location: [place] [facility];  Service: Cardiovascular;  Laterality: N/A;  LEFT HEART CATH AND CORS/GRAFTS ANGIOGRAPHY N/A 01/05/2018   Procedure: LEFT HEART CATH AND CORS/GRAFTS ANGIOGRAPHY;  Surgeon: Sherren Mocha, MD;  Location: [place] [facility];  Service: Cardiovascular;  Laterality: N/A;   PERMANENT PACEMAKER INSERTION N/A 07/15/2012   Procedure: PERMANENT PACEMAKER INSERTION;  Surgeon: Deboraha Sprang, MD;  Location: [facility] CATH LAB;  Service: Cardiovascular;  Laterality: N/A;    RIGHT/LEFT HEART CATH AND CORONARY/GRAFT ANGIOGRAPHY N/A 05/22/2022   Procedure: RIGHT/LEFT HEART CATH AND CORONARY/GRAFT ANGIOGRAPHY;  Surgeon: Jettie Booze, MD;  Location: [place] [facility];  Service: Cardiovascular;  Laterality: N/A;   rotator cuff surgery Right 2017   SCLEROTHERAPY  06/06/2022   Procedure: SCLEROTHERAPY;  Surgeon: Yetta Flock, MD;  Location: [facility] ENDOS[facility];  Service: Gastroenterology;;   TONSILLECTOMY  1956   WISDOM TOOTH EXTRACTION      Allergies  Allergies  Allergen Reactions   Spironolactone     Cramps, dizziness   Cayenne Other (See Comments)    Sweats w/paprika too   Niacin And Related Other (See Comments)    Upset stomach   Sulfa Antibiotics Itching and Rash    "have no idea; mother told me I was allergic to"    Sulfonamide Derivatives Other (See Comments)    "have no idea; mother told me I was allergic to"    History of Present Illness    Russell Griffith has a PMH of coronary artery disease status post CABG x5 in 2000, permanent atrial fibrillation status post ablation x2 on apixaban, sinus node dysfunction status post PPM, carotid artery disease, mild aortic stenosis, chronic dizziness with no clear etiology, HTN, HLD, GERD with Barrett's esophagus, and tubular adenoma of colon.  His echocardiogram 8/22 showed an LVEF of 60 to 65%, mild aortic stenosis, mildly elevated RV SP of 40.5 mmHg.  He was seen by Dr. Percival Spanish on 05/13/2022.  During that time he reported shortness of breath with exertion and some discomfort in his lower epigastric region.  He denied chest discomfort.  Decision was made to undergo outpatient right and left heart catheterization for further evaluation.  His catheterization was performed on 05/23/2019.  Showed patent LIMA-LAD, and SVG-first diagonal with known CTO proximal SVG-RCA and proximal SVG-OM.  No stenosis 80% of his mid SVG-left PDA as well as 75% stenosis of his proximal circumflex noted.  Both were treated with  PCI and DES.  His catheterization also showed mild-moderate pulmonary hypertension which was felt to be due to mild fluid volume overload.  He was placed on aspirin Plavix and Eliquis for 1 month and then his aspirin was stopped.  He was seen in follow-up by Diona Browner NP-C on 05/29/2022.  He noted improvement in his shortness of breath.  He also reported 2 days of rectal bleeding followed by dark stools.  Case was discussed with Dr. Percival Spanish who recommended continuing aspirin and stopping if recurrent bleeding.  He presented to the emergency department on 06/05/2022 upon recommendation from PCP for further evaluation and worsening anemia with hemoglobin of 6.6 and positive Hemoccult.  His dual antiplatelet therapy and apixaban were held.  GI was consulted.  He received 2 units of PRBCs and underwent urgent endoscopy on 06/06/2022.  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  He received fulguration via argon plasma as well as hemostasis clips which stopped bleeding.  His hemoglobin dropped back down to 7.8 from the mid 8 range the previous day after  restarting Plavix.  He received another unit of PRBCs.  He was admitted to the hospital with GI bleed on 06/05/2022 and discharged on 06/10/2022.  He was seen in the hospital by Dr. Debara Pickett on 06/09/2022.  During that time his hemoglobin and hematocrit were stable.  It was recommended that he continue on clopidogrel monotherapy and follow-up with cardiology outpatient.  It was recommended that he restart apixaban if stable on follow-up.  His hemoglobin on 06/22/2022 was 10.5  He presents to the clinic today for follow-up evaluation and states he is starting to return to his exercise regimen.  He has been walking regularly and has been doing free weights 1-2 times per week.  We reviewed his recent hospitalizations and follow-up visit with his PCP.  His hemoglobin continues to increase but has not yet normalized.  His  ferritin is normal.  He reports that he has follow-up appointment with GI 16.  I will defer starting apixaban until after that appointment.  He reports compliance with clopidogrel.  He denies bleeding issues.  He has noted some lower extremity swelling that is improving with increased physical activity.  He reports that prior to his stenting he was unable to walk up stairs without increased shortness of breath.  At this time he is able to traverse stairs much more easily.  I will give him a salty 6 diet sheet,  support stocking sheet, have him continue to increase his physical activity as tolerated, and follow-up with Dr. Percival Spanish as scheduled.  Today he denies chest pain, shortness of breath,  fatigue, palpitations, melena, hematuria, hemoptysis, diaphoresis, weakness, presyncope, syncope, orthopnea, and PND.  Home Medications    Prior to Admission medications   Medication Sig Start Date End Date Taking? Authorizing Provider  atorvastatin (LIPITOR) 40 MG tablet Take 1 tablet (40 mg total) by mouth daily. 05/22/22 11/18/22  Cheryln Manly, NP  cephALEXin (KEFLEX) 500 MG capsule Take 1 capsule (500 mg total) by mouth 4 (four) times daily. 06/12/22   [name], Evie Lacks, MD  Cholecalciferol 25 MCG (1000 UT) capsule Take 1,000 Units by mouth daily with supper.     [provider]  clopidogrel (PLAVIX) 75 MG tablet Take 1 tablet (75 mg total) by mouth daily. 05/22/22 05/22/23  Jettie Booze, MD  Ferric Maltol (ACCRUFER) 30 MG CAPS 1 po bid 06/17/22   [name], Evie Lacks, MD  isosorbide mononitrate (IMDUR) 60 MG 24 hr tablet Take 1 tablet (60 mg total) by mouth daily. 05/13/22   Minus Breeding, MD  losartan (COZAAR) 25 MG tablet Take 0.5 tablets (12.5 mg total) by mouth daily. Patient taking differently: Take 25 mg by mouth daily. 02/05/22   [name], Evie Lacks, MD  nitroGLYCERIN (NITROSTAT) 0.4 MG SL tablet Place 1 tablet (0.4 mg total) under the tongue every 5 (five) minutes as  needed. 05/22/22   Cheryln Manly, NP  NONFORMULARY OR COMPOUNDED ITEM Apply 1 application topically daily as needed (rash). Cetaphil + triamcinolone cream    [provider]  pantoprazole (PROTONIX) 40 MG tablet Take 1 tablet (40 mg total) by mouth 2 (two) times daily. 06/01/22   [name], Evie Lacks, MD    Family History    Family History  Problem Relation Age of Onset   Coronary artery disease Other 79   Diabetes Other    Multiple myeloma Mother    Diabetes Father    Renal Disease Father    Hyperlipidemia Other    Hypertension Other    Colon  cancer Neg Hx    Esophageal cancer Neg Hx    Stomach cancer Neg Hx    Rectal cancer Neg Hx    He indicated that his mother is deceased. He indicated that his father is deceased. He indicated that his maternal grandmother is deceased. He indicated that his maternal grandfather is deceased. He indicated that his paternal grandmother is deceased. He indicated that his paternal grandfather is deceased. He indicated that both of his daughters are alive. He indicated that the status of his neg hx is unknown. He indicated that two of his three others are alive.  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  2  daughters-'75, '79; 1 grandchild; step-daughter and step grandson.  Regular exercise-yes, runs 1.5-2 mi 4x/wk, also eliptical      Patient signed a Designated Party Release to allow his wife, Linard Daft, to have access to his medical records/ information.   Social Determinants of Health   Financial Resource Strain: Low Risk  (01/05/2022)   Overall Financial Resource Strain (CARDIA)    Difficulty of Paying Living Expenses: Not hard at all  Food Insecurity: No Food Insecurity (01/05/2022)   Hunger Vital Sign    Worried About Running Out of Food in the Last Year: Never true    Ran Out of Food in the Last Year: Never true  Transportation Needs: No Transportation Needs (01/05/2022)   PRAPARE - Hydrologist (Medical): No    Lack of Transportation (Non-Medical): No  Physical Activity: Sufficiently Active (01/05/2022)   Exercise Vital Sign    Days of Exercise per Week: 7 days    Minutes of Exercise per Session: 60 min  Stress: No Stress Concern Present (01/05/2022)   [place]    Feeling of Stress : Not at all  Social Connections: [place] (01/05/2022)   Social Connection and Isolation Panel [NHANES]    Frequency of Communication with Friends and Family: More than three times a week    Frequency of Social Gatherings with Friends and Family: More than three times a week    Attends Religious Services: More than 4 times per year    Active Member of [employer] or Organizations: Yes    Attends Archivist Meetings: More than 4 times per year    Marital Status: Married  Human resources officer Violence: Not At Risk (01/05/2022)   Humiliation, Afraid, Rape, and Kick questionnaire    Fear of Current or Ex-Partner: No    Emotionally Abused: No    Physically Abused: No    Sexually Abused: No  Review of Systems    General:  No chills, fever, night sweats or weight changes.  Cardiovascular:  No chest  pain, dyspnea on exertion, edema, orthopnea, palpitations, paroxysmal nocturnal dyspnea. Dermatological: No rash, lesions/masses Respiratory: No cough, dyspnea Urologic: No hematuria, dysuria Abdominal:   No nausea, vomiting, diarrhea, bright red blood per rectum, melena, or hematemesis Neurologic:  No visual changes, wkns, changes in mental status. All other systems reviewed and are otherwise negative except as noted above.  Physical Exam    VS:  BP 111/62   Pulse 61   Ht '5\' 11"'  (1.803 m)   Wt 189 lb 12.8 oz (86.1 kg)   SpO2 96%   BMI 26.47 kg/m  , BMI Body mass index is 26.47 kg/m. GEN: Well nourished, well developed, in no acute distress. HEENT: normal. Neck: Supple, no JVD, carotid bruits, or masses. Cardiac: Irregularly irregular, no murmurs, rubs, or gallops. No clubbing, cyanosis, edema.  Radials/DP/PT 2+ and equal bilaterally.  Respiratory:  Respirations regular and unlabored, clear to auscultation bilaterally. GI: Soft, nontender, nondistended, BS + x 4. MS: no deformity or atrophy. Skin: warm and dry, no rash. Neuro:  Strength and sensation are intact. Psych: Normal affect.  Accessory Clinical Findings    Recent Labs: 09/29/2021: TSH 4.55 06/15/2022: ALT 17 06/22/2022: BUN 17; Creatinine, Ser 1.09; Hemoglobin 10.5; Platelets 186.0; Potassium 3.8; Sodium 142   Recent Lipid Panel    Component Value Date/Time   CHOL 105 04/14/2022 1157   TRIG 42.0 04/14/2022 1157   HDL 31.60 (L) 04/14/2022 1157   CHOLHDL 3 04/14/2022 1157   VLDL 8.4 04/14/2022 1157   LDLCALC 65 04/14/2022 1157    ECG personally reviewed by me today-none today.  Echocardiogram 07/08/2021 IMPRESSIONS     1. Left ventricular ejection fraction, by estimation, is 65 to 70%. The  left ventricle has normal function. The left ventricle has no regional  wall motion abnormalities. Left ventricular diastolic function could not  be evaluated.   2. Right ventricular systolic function is mildly reduced.  The right  ventricular size is normal. There is mildly elevated pulmonary artery  systolic pressure. The estimated right ventricular systolic pressure is  11.9 mmHg.   3. The mitral valve is abnormal. Trivial mitral valve regurgitation.   4. The aortic valve is tricuspid. Aortic valve regurgitation is trivial.  Mild aortic valve stenosis. Aortic regurgitation PHT measures 626 msec.  Aortic valve area, by VTI measures 1.31 cm. Aortic valve mean gradient  measures 12.0 mmHg. Aortic valve  Vmax measures 2.51 m/s. DI is 0.35.   5. The inferior vena cava is normal in size with <50% respiratory  variability, suggesting right atrial pressure of 8 mmHg.   Comparison(s): Changes from prior study are noted. 11/11/2018: LVEF  60-65%, mild AS, mild AI, mean gradient 9 mmHg, DI 0.42.  Assessment & Plan   1.  Coronary artery disease-status post CABG x5 in 2000.  Denies recent episodes of arm neck back or chest discomfort.  Underwent right and left heart cath on 05/22/2022 with placement of 2 stents.  (SVG-left PDA and DES to circumflex). Continue clopidogrel, atorvastatin Heart healthy low-sodium high-fiber diet  Atrial fibrillation-heart rate today 61.  Irregularly irregular.    Status post ablation x2.  CHA2DS2-VASc score 3 (hypertension, vascular disease, age)  Continue clopidogrel Would recommend restarting apixaban but will defer at this time and await recommendations of GI on the 16th. Continue to monitor for bleeding issues  Hyperlipidemia-LDL 65 on 04/14/2022 Continue atorvastatin, clopidogrel Heart  healthy low-sodium high-fiber diet Increase physical activity as tolerated  GI bleed-denies further episodes of melena, hematochezia and coughing up blood or nosebleeds. Underwent urgent endoscopy on 06/06/2022.  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  Received fulguration via argon plasma as well as hemostasis clips which stopped  bleeding. Hemoglobin 10.5 on 06/22/2022. Follow-up with GI planned on August 16.  Disposition: Follow-up with Dr. Percival Spanish as scheduled   Jossie Ng. Exa Bomba NP-C     06/25/2022, 8:19 AM [address] [place] [address] Office [phone] Fax [phone]  Notice: This dictation was prepared with Dragon dictation along with smaller phrase technology. Any transcriptional errors that result from this process are unintentional and may not be corrected upon review.  I spent 14 minutes examining this patient, reviewing medications, and using patient centered shared decision making involving her cardiac care.  Prior to her visit I spent greater than 20 minutes reviewing her past medical history,  medications, and prior cardiac tests.

## 2022-06-25 ENCOUNTER — Encounter: Payer: Self-pay | Admitting: General Practice

## 2022-06-25 ENCOUNTER — Ambulatory Visit (INDEPENDENT_AMBULATORY_CARE_PROVIDER_SITE_OTHER): Payer: PPO | Admitting: General Practice

## 2022-06-25 VITALS — BP 111/62 | HR 61 | Ht 71.0 in | Wt 189.8 lb

## 2022-06-25 DIAGNOSIS — K625 Hemorrhage of anus and rectum: Secondary | ICD-10-CM | POA: Diagnosis not present

## 2022-06-25 DIAGNOSIS — E785 Hyperlipidemia, unspecified: Secondary | ICD-10-CM | POA: Diagnosis not present

## 2022-06-25 DIAGNOSIS — I4891 Unspecified atrial fibrillation: Secondary | ICD-10-CM | POA: Diagnosis not present

## 2022-06-25 DIAGNOSIS — I25810 Atherosclerosis of coronary artery bypass graft(s) without angina pectoris: Secondary | ICD-10-CM

## 2022-06-25 NOTE — Patient Instructions (Addendum)
Medication Instructions:  The current medical regimen is effective;  continue present plan and medications as directed. Please refer to the Current Medication list given to you today.   *If you need a refill on your cardiac medications before your next appointment, please call your pharmacy*  Lab Work:   Testing/Procedures:  NONE    NONE  If you have labs (blood work) drawn today and your tests are completely normal, you will receive your results only by: [place] (if you have MyChart) OR  A paper copy in the mail If you have any lab test that is abnormal or we need to change your treatment, we will call you to review the results.  You may also go to any of these [facility] locations:  [place] [address],  [place] [address] ([facility]) [address]  [place] [address] [facility])  Special Instructions PLEASE READ AND FOLLOW SALTY 6-ATTACHED-1,'800mg'$  daily  PLEASE CONTINUE INCREASE PHYSICAL ACTIVITY AS TOLERATED   PLEASE PURCHASE AND WEAR COMPRESSION STOCKINGS DAILY AND TAKE OFF AT BEDTIME. Compression stockings are elastic socks that squeeze the legs. They help to increase blood flow to the legs and to decrease swelling in the legs from fluid retention, and reduce the chance of developing blood clots in the lower legs. Please put on in the AM when dressing and off at night when dressing for bed.  LET THEM KNOW THAT YOU NEED KNEE HIGH'S WITH COMPRESSION OF 15-20 mmhg.  [employer]  [place] ([place] [phone]); [place], [place] [phone]; [phone]  EMAIL   [email or web address].  PLEASE MAKE SURE TO ELEVATE YOUR FEET & LEGS WHILE SITTING, THIS WILL HELP WITH THE SWELLING ALSO.    Follow-Up: Your next appointment:  KEEP SCHEDULED APPOINTMENT  In Person with Minus Breeding, MD   At [facility], you and your health needs are our priority.  As part of our continuing mission to provide you with exceptional heart care, we have created designated Provider Care Teams.  These Care Teams include your primary Cardiologist (physician) and Advanced Practice Providers (APPs -  Physician Assistants and Nurse Practitioners) who all work together to provide you with the care you need, when you need it.  Important Information About Sugar             6 SALTY THINGS TO AVOID     1,'800MG'$  DAILY

## 2022-07-03 NOTE — Progress Notes (Signed)
Remote pacemaker transmission.   

## 2022-07-13 ENCOUNTER — Ambulatory Visit (HOSPITAL_COMMUNITY): Payer: PPO | Attending: Cardiology

## 2022-07-13 DIAGNOSIS — I350 Nonrheumatic aortic (valve) stenosis: Secondary | ICD-10-CM | POA: Insufficient documentation

## 2022-07-13 LAB — ECHOCARDIOGRAM COMPLETE
AR max vel: 1.49 cm2
AV Area VTI: 1.54 cm2
AV Area mean vel: 1.52 cm2
AV Mean grad: 14 mmHg
AV Peak grad: 28.1 mmHg
Ao pk vel: 2.65 m/s
Calc EF: 56.4 %
P 1/2 time: 653 msec
S' Lateral: 3.4 cm
Single Plane A2C EF: 59.7 %
Single Plane A4C EF: 53.3 %

## 2022-07-15 ENCOUNTER — Ambulatory Visit: Payer: PPO | Admitting: Gastroenterology

## 2022-07-16 ENCOUNTER — Encounter: Payer: Self-pay | Admitting: Physician Assistant

## 2022-07-16 ENCOUNTER — Other Ambulatory Visit (INDEPENDENT_AMBULATORY_CARE_PROVIDER_SITE_OTHER): Payer: PPO

## 2022-07-16 ENCOUNTER — Ambulatory Visit (INDEPENDENT_AMBULATORY_CARE_PROVIDER_SITE_OTHER): Payer: PPO | Admitting: Physician Assistant

## 2022-07-16 VITALS — BP 128/72 | HR 60 | Ht 71.0 in | Wt 188.4 lb

## 2022-07-16 DIAGNOSIS — D62 Acute posthemorrhagic anemia: Secondary | ICD-10-CM

## 2022-07-16 DIAGNOSIS — Z7901 Long term (current) use of anticoagulants: Secondary | ICD-10-CM

## 2022-07-16 DIAGNOSIS — Z8719 Personal history of other diseases of the digestive system: Secondary | ICD-10-CM

## 2022-07-16 LAB — IBC + FERRITIN
Ferritin: 15.9 ng/mL — ABNORMAL LOW (ref 22.0–322.0)
Iron: 40 ug/dL — ABNORMAL LOW (ref 42–165)
Saturation Ratios: 7.6 % — ABNORMAL LOW (ref 20.0–50.0)
TIBC: 526.4 ug/dL — ABNORMAL HIGH (ref 250.0–450.0)
Transferrin: 376 mg/dL — ABNORMAL HIGH (ref 212.0–360.0)

## 2022-07-16 LAB — CBC WITH DIFFERENTIAL/PLATELET
Basophils Absolute: 0.1 10*3/uL (ref 0.0–0.1)
Basophils Relative: 1.2 % (ref 0.0–3.0)
Eosinophils Absolute: 0.1 10*3/uL (ref 0.0–0.7)
Eosinophils Relative: 2.1 % (ref 0.0–5.0)
HCT: 33.2 % — ABNORMAL LOW (ref 39.0–52.0)
Hemoglobin: 11.1 g/dL — ABNORMAL LOW (ref 13.0–17.0)
Lymphocytes Relative: 25 % (ref 12.0–46.0)
Lymphs Abs: 1.1 10*3/uL (ref 0.7–4.0)
MCHC: 33.4 g/dL (ref 30.0–36.0)
MCV: 79.9 fl (ref 78.0–100.0)
Monocytes Absolute: 0.9 10*3/uL (ref 0.1–1.0)
Monocytes Relative: 19.8 % — ABNORMAL HIGH (ref 3.0–12.0)
Neutro Abs: 2.4 10*3/uL (ref 1.4–7.7)
Neutrophils Relative %: 51.9 % (ref 43.0–77.0)
Platelets: 158 10*3/uL (ref 150.0–400.0)
RBC: 4.16 Mil/uL — ABNORMAL LOW (ref 4.22–5.81)
RDW: 18.5 % — ABNORMAL HIGH (ref 11.5–15.5)
WBC: 4.6 10*3/uL (ref 4.0–10.5)

## 2022-07-16 NOTE — Progress Notes (Signed)
Chief Complaint: Follow up GI Bleed  HPI:     [name] is a 76 year old Caucasian male, known to Dr. Ardis Hughs, with a past medical history as listed below including significant cardiac history remarkable for CAD status post CABG x5, status post cardiac cath on 6/23 at which time see if VG and PDA were stenosed 80% and treated with PCI/DES x1, persistent A-fib, sinus node dysfunction with pacemaker, aortic stenosis and history of GERD on chronic pantoprazole, who presents to clinic today for follow-up of his GI bleed.    06/06/2022-06/10/2022 patient admitted to the hospital for GI bleed and consulted by our service.  At time of admission patient was on Plavix, aspirin and Eliquis.  He had worsening anemia.  At that time was having melena.  At that time he was scheduled for an EGD/enteroscopy and continued on Pantoprazole 40 twice daily.    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  Hemostasis clips were placed.    06/23/2019 3 repeat CBC with a hemoglobin increased to 10.5 (9.2 on 7/17).  Ferritin 60.1.    Today, the patient tells me that he is doing well he has seen no further black stools.  He just has a few questions in regards to his blood thinners as well as his Pantoprazole which he has continued 40 mg twice a day.  Denies any abdominal pain.    Denies fever, chills, weight loss or change in bowel habits.  Past Medical History:  Diagnosis Date   Anxiety    Aortic stenosis    Mild, echo, April, 2014   BPH (benign prostatic hyperplasia)    CAD (coronary artery disease)    a. 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 s/p PVI at [facility]   Precancerous lesion    Forehead   Primary osteoarthritis of left knee    Mild   Prolapsed internal hemorrhoids, grade 3 08/12/2015   PVC's (premature ventricular contractions)    Tubular adenoma of colon     Past Surgical History:  Procedure Laterality Date   ABLATION     PVI at [place] N/A 01/25/2019   Procedure: [place];  Surgeon: [name], MD;  Location: [place] [facility];  Service: Cardiovascular;  Laterality: N/A;   CARDIOVERSION N/A 10/14/2018   Procedure: CARDIOVERSION;  Surgeon: Fay Records, MD;  Location: [facility];  Service: Cardiovascular;  Laterality: N/A;   CARDIOVERSION N/A 08/18/2019   Procedure: CARDIOVERSION;  Surgeon: Jerline Pain, MD;  Location: [facility]OSCOPY;  Service: Cardiovascular;  Laterality: N/A;   COLONOSCOPY     CORONARY ARTERY BYPASS GRAFT  2000   CABG X5   CORONARY STENT INTERVENTION N/A 05/22/2022   Procedure: CORONARY STENT INTERVENTION;  Surgeon: Jettie Booze, MD;  Location: [place] [facility];  Service: Cardiovascular;  Laterality: N/A;   CYSTOSCOPY WITH RETROGRADE PYELOGRAM, URETEROSCOPY AND STENT PLACEMENT Bilateral 09/04/2020   Procedure: CYSTOSCOPY WITH BILATERAL RETROGRADE PYELOGRAM, RIGHT URETEROSCOPY AND RIGHT  STENT PLACEMENT;  Surgeon: Franchot Gallo, MD;  Location: [facility];  Service: Urology;  Laterality: Bilateral;   ENTEROSCOPY N/A 06/06/2022   Procedure: ENTEROSCOPY;  Surgeon: Yetta Flock, MD;  Location: [facility];  Service: Gastroenterology;  Laterality: N/A;   ESOPHAGOGASTRODUODENOSCOPY     HEAD & NECK SKIN LESION EXCISIONAL BIOPSY     HEMORRHOID BANDING     HEMOSTASIS CLIP PLACEMENT   06/06/2022   Procedure: HEMOSTASIS CLIP PLACEMENT;  Surgeon: Yetta Flock, MD;  Location: [facility];  Service: Gastroenterology;;   HOT HEMOSTASIS N/A 06/06/2022   Procedure: HOT HEMOSTASIS (ARGON PLASMA COAGULATION/BICAP);  Surgeon: Yetta Flock, MD;  Location: [facility];  Service: Gastroenterology;  Laterality: N/A;   INTRAVASCULAR PRESSURE WIRE/FFR STUDY N/A 01/05/2018   Procedure: INTRAVASCULAR PRESSURE WIRE/FFR STUDY;  Surgeon: Sherren Mocha, MD;  Location: [place] [facility];  Service: Cardiovascular;  Laterality: N/A;   INTRAVASCULAR ULTRASOUND/IVUS N/A 05/22/2022   Procedure: Intravascular Ultrasound/IVUS;  Surgeon: Jettie Booze, MD;  Location: [place] [facility];  Service: Cardiovascular;  Laterality: N/A;   LEFT HEART CATH AND CORS/GRAFTS ANGIOGRAPHY N/A 01/05/2018   Procedure: LEFT HEART CATH AND CORS/GRAFTS ANGIOGRAPHY;  Surgeon: Sherren Mocha, MD;  Location: [place] [facility];  Service: Cardiovascular;  Laterality: N/A;   PERMANENT PACEMAKER INSERTION N/A 07/15/2012   Procedure: PERMANENT PACEMAKER INSERTION;  Surgeon: Deboraha Sprang, MD;  Location: [facility] CATH LAB;  Service: Cardiovascular;  Laterality: N/A;   RIGHT/LEFT HEART CATH AND CORONARY/GRAFT ANGIOGRAPHY N/A 05/22/2022   Procedure: RIGHT/LEFT HEART CATH AND CORONARY/GRAFT ANGIOGRAPHY;  Surgeon: Jettie Booze, MD;  Location: [facility];  Service: Cardiovascular;  Laterality: N/A;   rotator cuff surgery Right 2017   SCLEROTHERAPY  06/06/2022   Procedure: SCLEROTHERAPY;  Surgeon: Yetta Flock, MD;  Location: [facility];  Service: Gastroenterology;;   TONSILLECTOMY  1956   WISDOM TOOTH EXTRACTION      Current Outpatient Medications  Medication Sig Dispense Refill   atorvastatin (LIPITOR) 40 MG tablet Take 1 tablet (40 mg total) by mouth daily. 90 tablet 1   Cholecalciferol 25 MCG (1000 UT) capsule Take 1,000 Units by mouth daily with supper.      clopidogrel (PLAVIX) 75 MG tablet  Take 1 tablet (75 mg total) by mouth daily. 30 tablet 5   isosorbide mononitrate (IMDUR) 60 MG 24 hr tablet Take 1 tablet (60 mg total) by mouth daily. 90 tablet 3   losartan (COZAAR) 25 MG tablet Take 0.5 tablets (12.5 mg total) by mouth daily. (Patient taking differently: Take 25 mg by mouth daily.) 90 tablet 1   nitroGLYCERIN (NITROSTAT) 0.4 MG SL tablet Place 1 tablet (0.4 mg total) under the tongue every 5 (five) minutes as needed. 25 tablet 2   NONFORMULARY OR COMPOUNDED ITEM Apply 1 application topically daily as needed (rash). Cetaphil + triamcinolone cream     pantoprazole (PROTONIX) 40 MG tablet Take 1 tablet (40 mg total) by mouth 2 (two) times daily. 180 tablet 1   No current facility-administered medications for this visit.    Allergies as of 07/16/2022 - Review Complete 07/16/2022  Allergen Reaction Noted   Spironolactone  09/29/2021   Cayenne Other (See Comments) 10/29/2015   Niacin and related Other (See Comments) 08/06/2016   Sulfa antibiotics Itching and  Rash 07/27/2021   Sulfonamide derivatives Other (See Comments)     Family History  Problem Relation Age of Onset   Coronary artery disease Other 1   Diabetes Other    Multiple myeloma Mother    Diabetes Father    Renal Disease Father    Hyperlipidemia Other    Hypertension Other    Colon cancer Neg Hx    Esophageal cancer Neg Hx    Stomach cancer Neg Hx    Rectal cancer Neg Hx     Social History   Socioeconomic History   Marital status: Married    Spouse name: Not on file   Number of children: 2   Years of education: 18   Highest education level: Not on file  Occupational History   Occupation: Technical brewer: [employer]    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  2 daughters-'75, '79; 1 grandchild; step-daughter and step grandson.  Regular exercise-yes, runs 1.5-2 mi 4x/wk, also eliptical      Patient signed a Designated Party Release to allow his wife, Denali Becvar, to have access to his medical records/ information.   Social Determinants of Health   Financial Resource Strain: Low Risk  (01/05/2022)   Overall Financial Resource Strain (CARDIA)    Difficulty of Paying Living Expenses: Not hard at all  Food Insecurity: No Food Insecurity (01/05/2022)   Hunger Vital Sign    Worried About Running Out of Food in the Last Year: Never true    Ran Out of Food in the Last Year: Never true  Transportation Needs: No Transportation Needs (01/05/2022)   PRAPARE - Hydrologist (Medical): No    Lack of Transportation (Non-Medical): No  Physical Activity: Sufficiently Active (01/05/2022)   Exercise Vital Sign    Days of Exercise per Week: 7 days    Minutes of Exercise per Session: 60 min  Stress: No Stress Concern Present (01/05/2022)   [place]    Feeling of Stress : Not at all  Social Connections: [place] (01/05/2022)   Social Connection and Isolation Panel [NHANES]    Frequency of Communication with Friends and Family: More than three times a week    Frequency of Social Gatherings with Friends and Family: More than three times a week    Attends Religious Services: More than 4 times per year    Active Member of [employer] or Organizations: Yes    Attends Music therapist: More than 4 times per year    Marital Status: Married  Human resources officer Violence: Not At Risk (01/05/2022)   Humiliation, Afraid, Rape, and Kick  questionnaire    Fear of Current or Ex-Partner: No    Emotionally Abused: No    Physically Abused: No    Sexually Abused: No    Review of Systems:    Constitutional: No weight loss, fever or chills Cardiovascular: No chest pain Respiratory:  No SOB  Gastrointestinal: See HPI and otherwise negative   Physical Exam:  Vital signs: BP 128/72   Pulse 60   Ht '5\' 11"'  (1.803 m)   Wt 188 lb 6.4 oz (85.5 kg)   SpO2 96%   BMI 26.28 kg/m    Constitutional:   Pleasant Caucasian male appears to be in NAD, Well developed, Well nourished, alert and cooperative Respiratory: Respirations even and unlabored. Lungs clear to auscultation bilaterally.   No wheezes, crackles, or rhonchi.  Cardiovascular: Normal S1, S2. No MRG. Regular rate and rhythm. No peripheral edema, cyanosis or pallor.  Gastrointestinal:  Soft, nondistended, nontender. No rebound or guarding. Normal bowel sounds. No appreciable masses or hepatomegaly. Rectal:  Not performed.  Psychiatric: Oriented to person, place and time. Demonstrates good judgement and reason without abnormal affect or behaviors.  RELEVANT LABS AND IMAGING: CBC    Component Value Date/Time   WBC 3.9 (L) 06/22/2022 1020   RBC 3.82 (L) 06/22/2022 1020   HGB 10.5 (L) 06/22/2022 1020   HGB 9.0 (L) 05/29/2022 1202   HCT 31.8 (L) 06/22/2022 1020   HCT 28.2 (L) 05/29/2022 1202   PLT 186.0 06/22/2022 1020   PLT 203 05/29/2022 1202   MCV 83.3 06/22/2022 1020   MCV 83 05/29/2022 1202   MCH 27.4 06/10/2022 0421   MCHC 32.8 06/22/2022 1020   RDW 18.1 (H) 06/22/2022 1020   RDW 14.6 05/29/2022 1202   LYMPHSABS 1.0 06/22/2022 1020   LYMPHSABS 1.4 08/15/2019 1342   MONOABS 0.7 06/22/2022 1020   EOSABS 0.1 06/22/2022 1020   EOSABS 0.1 08/15/2019 1342   BASOSABS 0.0 06/22/2022 1020   BASOSABS 0.1 08/15/2019 1342    CMP     Component Value Date/Time   NA 142 06/22/2022 1020   NA 141 05/13/2022 0956   K 3.8 06/22/2022 1020   CL 108 06/22/2022 1020   CO2  26 06/22/2022 1020   GLUCOSE 89 06/22/2022 1020   BUN 17 06/22/2022 1020   BUN 21 05/13/2022 0956   CREATININE 1.09 06/22/2022 1020   CREATININE 1.12 07/13/2012 1700   CALCIUM 9.1 06/22/2022 1020   PROT 5.6 (L) 06/15/2022 0906   PROT 6.4 06/04/2021 1109   ALBUMIN 3.7 06/15/2022 0906   ALBUMIN 4.4 06/04/2021 1109   AST 23 06/15/2022 0906   ALT 17 06/15/2022 0906   ALKPHOS 115 06/15/2022 0906   BILITOT 2.2 (H) 06/15/2022 0906   BILITOT 3.0 (H) 06/04/2021 1109   GFRNONAA >60 06/09/2022 0357   GFRAA 54 (L) 08/29/2020 1338    Assessment: 1.  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  Chronic anticoagulation status post drug-eluting stent in June: Currently Eliquis is still on hold, patient on Plavix and Aspirin, he is following with cardiology next week to see if they want to restart the Eliquis 3.  Anemia related to acute GI bleed  Plan: 1.  Discussed with patient that he should continue his Pantoprazole 40 mg twice a day for total of 8 weeks and then can decrease to once daily dosing.  Would recommend he do this slowly going to every other day dosing of twice daily, then decreasing to just once daily.  If he has any trouble he can increase back to the twice daily dosing. 2.  Repeat CBC and iron studies with ferritin today. 3.  Discussed that at time of enteroscopy Dr. Havery Moros recommended repeat in 6 months given that there was a polyp suspicious for adenoma.  Patient would like to have this procedure done with Dr. Havery Moros. 4.  Patient to follow in clinic with me in 4 months.  At that time can schedule him for repeat EGD with Dr. Havery Moros.  He would need to hold his blood thinners at that point.  Ellouise Newer, PA-C [facility] 07/16/2022, 9:01 AM  Cc: Cassandria Anger, MD

## 2022-07-16 NOTE — Patient Instructions (Signed)
Continue Pantoprazole 40 mg twice daily for 8 weeks, then decrease to once daily.  Your provider has requested that you go to the basement level for lab work before leaving today. Press "B" on the elevator. The lab is located at the first door on the left as you exit the elevator.  _______________________________________________________  If you are age 74 or older, your body mass index should be between 23-30. Your Body mass index is 26.28 kg/m. If this is out of the aforementioned range listed, please consider follow up with your Primary Care Provider.  If you are age 53 or younger, your body mass index should be between 19-25. Your Body mass index is 26.28 kg/m. If this is out of the aformentioned range listed, please consider follow up with your Primary Care Provider.   ________________________________________________________  The [facility] providers would like to encourage you to use [facility] to communicate with providers for non-urgent requests or questions.  Due to long hold times on the telephone, sending your provider a message by [facility] may be a faster and more efficient way to get a response.  Please allow 48 business hours for a response.  Please remember that this is for non-urgent requests.  _______________________________________________________

## 2022-07-16 NOTE — Progress Notes (Addendum)
Agree with assessment and plan as outlined. He will need to see cardiology to determine if / when he can stop Eliquis / Plavix for a polypectomy. Given his recent coronary intervention sounds like he may not be able to stop plavix for several months. He would be able to stay on aspirin for this. If he cannot stop Plavix or anticoagulation at your follow up with him please let me know, we can either do an exam on plavix to do biopsies or delay EGD with polypectomy until a few more months when he is able to stop Plavix. Thanks    ADDENDUM: Spoke with Dr. Percival Spanish - patient okay to hold Eliquis and Plavix in another 4 months, will need to be on aspirin however when he does this. Carollee Herter, you will see him in follow up for this and he will see Dr. Percival Spanish prior to that visit to ensure stable to hold his Eliquis / Plavix. thanks

## 2022-07-18 NOTE — Progress Notes (Unsigned)
Cardiology Office Note   Date:  07/20/2022   ID:  Russell Griffith, DOB 10/20/1946, MRN [id number]  PCP:  Cassandria Anger, MD  Cardiologist:   Minus Breeding, MD    Chief Complaint  Patient presents with   Coronary Artery Disease     History of Present Illness: Russell Griffith is a 76 y.o. male who presents for evaluation of known CAD.    He has a history of coronary disease with his last catheterization demonstrated disease as below.  He had stenting of his native circ and an SVG to the circ.  He has atrial fib He has had 2 ablations 1 at [facility] and most recently by Dr. Rayann Heman but has persistent atrial fibrillation.  He has been previously on Tikosyn.  He did not tolerate Multaq.  He has had persistent atrial fibrillation.  He presented to the emergency department on 06/05/2022 upon recommendation from PCP for further evaluation and worsening anemia with hemoglobin of 6.6 and positive Hemoccult.  His dual antiplatelet therapy and apixaban were held.  GI was consulted.  He received 2 units of PRBCs and underwent urgent endoscopy on 06/06/2022.  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  He received fulguration via argon plasma as well as hemostasis clips which stopped bleeding.  His hemoglobin dropped back down to 7.8 from the mid 8 range the previous day after restarting Plavix.  He received another unit of PRBCs.  He saw GI on the 16th .  I reviewed these records for this visit.    He had an echo last week demonstrating mild/mod aortic stenosis.    He since is not having any of the shortness of breath or mild chest discomfort.  His dizziness is resolved.  He feels much better.  He started to do some walking.  He denies any chest pressure, neck or arm discomfort.  He has had no PND or orthopnea.  He has had no GI bleeding.   Past Medical History:  Diagnosis Date   Anxiety    Aortic stenosis    Mild, echo, April, 2014    BPH (benign prostatic hyperplasia)    CAD (coronary artery disease)    a. 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 s/p PVI at [facility]   Precancerous lesion    Forehead   Primary osteoarthritis of left knee    Mild   Prolapsed internal hemorrhoids, grade 3 08/12/2015   PVC's (premature ventricular contractions)    Tubular adenoma of colon     Past Surgical History:  Procedure Laterality Date   ABLATION     PVI at [place] N/A 01/25/2019   Procedure: [place];  Surgeon: [name], MD;  Location: [place] CV LAB;  Service: Cardiovascular;  Laterality: N/A;   CARDIOVERSION N/A 10/14/2018   Procedure: CARDIOVERSION;  Surgeon: Fay Records, MD;  Location: [place];  Service: Cardiovascular;  Laterality: N/A;  CARDIOVERSION N/A 08/18/2019   Procedure: CARDIOVERSION;  Surgeon: Jerline Pain, MD;  Location: [facility] ENDOSCOPY;  Service: Cardiovascular;  Laterality: N/A;   COLONOSCOPY     CORONARY ARTERY BYPASS GRAFT  2000   CABG X5   CORONARY STENT INTERVENTION N/A 05/22/2022   Procedure: CORONARY STENT INTERVENTION;  Surgeon: Jettie Booze, MD;  Location: [place] CV LAB;  Service: Cardiovascular;  Laterality: N/A;   CYSTOSCOPY WITH RETROGRADE PYELOGRAM, URETEROSCOPY AND STENT PLACEMENT Bilateral 09/04/2020   Procedure: CYSTOSCOPY WITH BILATERAL RETROGRADE PYELOGRAM, RIGHT URETEROSCOPY AND RIGHT  STENT PLACEMENT;  Surgeon: Franchot Gallo, MD;  [facility];  Service: Urology;  Laterality: Bilateral;   ENTEROSCOPY N/A 06/06/2022   Procedure: ENTEROSCOPY;  Surgeon:  Yetta Flock, MD;  [facility];  Service: Gastroenterology;  Laterality: N/A;   ESOPHAGOGASTRODUODENOSCOPY     HEAD & NECK SKIN LESION EXCISIONAL BIOPSY     HEMORRHOID BANDING     HEMOSTASIS CLIP PLACEMENT  06/06/2022   Procedure: HEMOSTASIS CLIP PLACEMENT;  Surgeon: Yetta Flock, MD;  [facility];  Service: Gastroenterology;;   HOT HEMOSTASIS N/A 06/06/2022   Procedure: HOT HEMOSTASIS (ARGON PLASMA COAGULATION/BICAP);  Surgeon: Yetta Flock, MD;  Location: [facility];  Service: Gastroenterology;  Laterality: N/A;   INTRAVASCULAR PRESSURE WIRE/FFR STUDY N/A 01/05/2018   Procedure: INTRAVASCULAR PRESSURE WIRE/FFR STUDY;  Surgeon: Sherren Mocha, MD;  Location: [place] CV LAB;  Service: Cardiovascular;  Laterality: N/A;   INTRAVASCULAR ULTRASOUND/IVUS N/A 05/22/2022   Procedure: Intravascular Ultrasound/IVUS;  Surgeon: Jettie Booze, MD;  Location: [place] CV LAB;  Service: Cardiovascular;  Laterality: N/A;   LEFT HEART CATH AND CORS/GRAFTS ANGIOGRAPHY N/A 01/05/2018   Procedure: LEFT HEART CATH AND CORS/GRAFTS ANGIOGRAPHY;  Surgeon: Sherren Mocha, MD;  Location: [place] CV LAB;  Service: Cardiovascular;  Laterality: N/A;   PERMANENT PACEMAKER INSERTION N/A 07/15/2012   Procedure: PERMANENT PACEMAKER INSERTION;  Surgeon: Deboraha Sprang, MD;  Location: [facility] CATH LAB;  Service: Cardiovascular;  Laterality: N/A;   RIGHT/LEFT HEART CATH AND CORONARY/GRAFT ANGIOGRAPHY N/A 05/22/2022   Procedure: RIGHT/LEFT HEART CATH AND CORONARY/GRAFT ANGIOGRAPHY;  Surgeon: Jettie Booze, MD;  Location: [place] CV LAB;  Service: Cardiovascular;  Laterality: N/A;   rotator cuff surgery Right 2017   SCLEROTHERAPY  06/06/2022   Procedure: SCLEROTHERAPY;  Surgeon: Yetta Flock, MD;  [facility];  Service: Gastroenterology;;   TONSILLECTOMY  1956   WISDOM TOOTH EXTRACTION       Current Outpatient Medications  Medication Sig Dispense  Refill   apixaban (ELIQUIS) 5 MG TABS tablet Take 1 tablet (5 mg total) by mouth 2 (two) times daily. 60 tablet 6   atorvastatin (LIPITOR) 40 MG tablet Take 1 tablet (40 mg total) by mouth daily. 90 tablet 1   Cholecalciferol 25 MCG (1000 UT) capsule Take 1,000 Units by mouth daily with supper.      clopidogrel (PLAVIX) 75 MG tablet Take 1 tablet (75 mg total) by mouth daily. 30 tablet 5   isosorbide mononitrate (IMDUR) 60 MG 24 hr tablet Take 1 tablet (60 mg total) by mouth daily. 90 tablet 3   losartan (COZAAR) 25 MG tablet Take 0.5 tablets (12.5 mg total) by mouth daily. (Patient taking differently: Take 25 mg by mouth daily.) 90 tablet 1   nitroGLYCERIN (NITROSTAT) 0.4 MG SL tablet Place 1 tablet (0.4 mg total) under the tongue every 5 (five) minutes as needed. 25 tablet 2   NONFORMULARY OR COMPOUNDED ITEM Apply 1 application  topically daily as needed (rash). Cetaphil + triamcinolone cream     pantoprazole (PROTONIX) 40 MG tablet Take 1 tablet (40 mg total) by mouth 2 (two) times daily. 180 tablet 1   No current facility-administered medications for this visit.    Allergies:   Spironolactone, Cayenne, Niacin and related, Sulfa antibiotics, and Sulfonamide derivatives    ROS:  Please see the history of present illness.   Otherwise, review of systems are positive for none.   All other systems are reviewed and negative.    PHYSICAL EXAM: VS:  BP 100/60   Pulse 63   Ht '5\' 11"'$  (1.803 m)   Wt 189 lb (85.7 kg)   SpO2 97%   BMI 26.36 kg/m  , BMI Body mass index is 26.36 kg/m. GENERAL:  Well appearing NECK:  No jugular venous distention, waveform within normal limits, carotid upstroke brisk and symmetric, no bruits, no thyromegaly LUNGS:  Clear to auscultation bilaterally CHEST:  Well healed sternotomy scar.  Well-healed pacemaker pocket HEART:  PMI not displaced or sustained,S1 and S2 within normal limits, no S3, no clicks, no rubs, soft apical systolic murmur radiating slightly at  aortic outflow tract murmurs, irregular ABD:  Flat, positive bowel sounds normal in frequency in pitch, no bruits, no rebound, no guarding, no midline pulsatile mass, no hepatomegaly, no splenomegaly EXT:  2 plus pulses throughout, no edema, no cyanosis no clubbing   EKG:  EKG is not ordered today.    CATH 05/22/22  Diagnostic Dominance: Left  Intervention    Recent Labs: 09/29/2021: TSH 4.55 06/15/2022: ALT 17 06/22/2022: BUN 17; Creatinine, Ser 1.09; Potassium 3.8; Sodium 142 07/16/2022: Hemoglobin 11.1; Platelets 158.0    Lipid Panel    Component Value Date/Time   CHOL 105 04/14/2022 1157   TRIG 42.0 04/14/2022 1157   HDL 31.60 (L) 04/14/2022 1157   CHOLHDL 3 04/14/2022 1157   VLDL 8.4 04/14/2022 1157   LDLCALC 65 04/14/2022 1157      Wt Readings from Last 3 Encounters:  07/20/22 189 lb (85.7 kg)  07/16/22 188 lb 6.4 oz (85.5 kg)  06/25/22 189 lb 12.8 oz (86.1 kg)    Other studies Reviewed: Additional studies/ records that were reviewed today include: Extensive review of hospital records, EP notes Review of the above records demonstrates: See above   ASSESSMENT AND PLAN:  ATRIAL FIB:   CHADS VASC score is 3.   I am going to restart the Eliquis.  I discussed this with his GI doctor.  He eventually will need gastric polyps removed and will need to be off Plavix and Eliquis but this is not going to be for about 4 months.  I will see him back prior to this.  I would like him to be at least on aspirin when he gets that procedure done.  PACEMAKER PLACEMENT:   He is up-to-date with follow-up and he is going to see Dr. Lovena Le soon.  There is a question of whether he will get his entire device explanted  CAD:    He is having no chest discomfort, neck or arm discomfort.  He is feeling much better since his PCI.  He will be on Plavix and Eliquis.   DYSPNEA: This is resolved.  No change in therapy.   AS:  This was mild to moderate in August and will be followed clinically.     DYSLIPIDEMIA: LDL was 65 with an HDL of 31.  No change in therapy.    Current medicines are reviewed  at length with the patient today.  The patient does not have concerns regarding medicines.  The following changes have been made: As above  Labs/ tests ordered today include:  None  Orders Placed This Encounter  Procedures   CBC     Disposition:   FU me in 3 months  Signed, Minus Breeding, MD  07/20/2022 10:23 AM    [place]

## 2022-07-20 ENCOUNTER — Ambulatory Visit (INDEPENDENT_AMBULATORY_CARE_PROVIDER_SITE_OTHER): Payer: PPO | Admitting: Cardiology

## 2022-07-20 ENCOUNTER — Encounter: Payer: Self-pay | Admitting: Cardiology

## 2022-07-20 VITALS — BP 100/60 | HR 63 | Ht 71.0 in | Wt 189.0 lb

## 2022-07-20 DIAGNOSIS — I25810 Atherosclerosis of coronary artery bypass graft(s) without angina pectoris: Secondary | ICD-10-CM

## 2022-07-20 DIAGNOSIS — I482 Chronic atrial fibrillation, unspecified: Secondary | ICD-10-CM

## 2022-07-20 DIAGNOSIS — E785 Hyperlipidemia, unspecified: Secondary | ICD-10-CM

## 2022-07-20 DIAGNOSIS — I350 Nonrheumatic aortic (valve) stenosis: Secondary | ICD-10-CM | POA: Diagnosis not present

## 2022-07-20 DIAGNOSIS — R0602 Shortness of breath: Secondary | ICD-10-CM | POA: Diagnosis not present

## 2022-07-20 MED ORDER — APIXABAN 5 MG PO TABS: 5.0000 mg | ORAL_TABLET | Freq: Two times a day (BID) | ORAL | 6 refills | Status: DC

## 2022-07-20 NOTE — Patient Instructions (Signed)
Medication Instructions:   RESTART ELIQUIS 5 MG ONE TABLET TWICE DAILY  *If you need a refill on your cardiac medications before your next appointment, please call your pharmacy*   Lab Work:  Your physician recommends that you return for lab work in: 2 [place]  If you have labs (blood work) drawn today and your tests are completely normal, you will receive your results only by: [place] (if you have MyChart) OR A paper copy in the mail If you have any lab test that is abnormal or we need to change your treatment, we will call you to review the results.   Follow-Up: At [facility], you and your health needs are our priority.  As part of our continuing mission to provide you with exceptional heart care, we have created designated Provider Care Teams.  These Care Teams include your primary Cardiologist (physician) and Advanced Practice Providers (APPs -  Physician Assistants and Nurse Practitioners) who all work together to provide you with the care you need, when you need it.  We recommend signing up for the patient portal called "MyChart".  Sign up information is provided on this After Visit Summary.  MyChart is used to connect with patients for Virtual Visits (Telemedicine).  Patients are able to view lab/test results, encounter notes, upcoming appointments, etc.  Non-urgent messages can be sent to your provider as well.   To learn more about what you can do with MyChart, go to [email or web address].    Your next appointment:   3 month(s)  The format for your next appointment:   In Person  Provider:   Minus Breeding, MD

## 2022-07-28 ENCOUNTER — Encounter: Payer: Self-pay | Admitting: Cardiology

## 2022-07-28 ENCOUNTER — Other Ambulatory Visit: Payer: Self-pay

## 2022-07-28 DIAGNOSIS — Z79899 Other long term (current) drug therapy: Secondary | ICD-10-CM

## 2022-08-05 DIAGNOSIS — I482 Chronic atrial fibrillation, unspecified: Secondary | ICD-10-CM | POA: Diagnosis not present

## 2022-08-05 DIAGNOSIS — Z79899 Other long term (current) drug therapy: Secondary | ICD-10-CM | POA: Diagnosis not present

## 2022-08-05 LAB — FERRITIN: Ferritin: 22 ng/mL — ABNORMAL LOW (ref 30–400)

## 2022-08-05 LAB — IRON: Iron: 37 ug/dL — ABNORMAL LOW (ref 38–169)

## 2022-08-06 LAB — CBC
Hematocrit: 35.2 % — ABNORMAL LOW (ref 37.5–51.0)
Hemoglobin: 10.8 g/dL — ABNORMAL LOW (ref 13.0–17.7)
MCH: 25.5 pg — ABNORMAL LOW (ref 26.6–33.0)
MCHC: 30.7 g/dL — ABNORMAL LOW (ref 31.5–35.7)
MCV: 83 fL (ref 79–97)
Platelets: 186 10*3/uL (ref 150–450)
RBC: 4.23 x10E6/uL (ref 4.14–5.80)
RDW: 16 % — ABNORMAL HIGH (ref 11.6–15.4)
WBC: 4.5 10*3/uL (ref 3.4–10.8)

## 2022-08-11 ENCOUNTER — Ambulatory Visit: Payer: PPO | Attending: Internal Medicine | Admitting: Internal Medicine

## 2022-08-11 ENCOUNTER — Encounter: Payer: Self-pay | Admitting: Internal Medicine

## 2022-08-11 VITALS — BP 122/63 | HR 67 | Ht 67.0 in | Wt 190.0 lb

## 2022-08-11 DIAGNOSIS — I495 Sick sinus syndrome: Secondary | ICD-10-CM | POA: Diagnosis not present

## 2022-08-11 DIAGNOSIS — I493 Ventricular premature depolarization: Secondary | ICD-10-CM

## 2022-08-11 DIAGNOSIS — I491 Atrial premature depolarization: Secondary | ICD-10-CM | POA: Diagnosis not present

## 2022-08-11 DIAGNOSIS — I4891 Unspecified atrial fibrillation: Secondary | ICD-10-CM | POA: Diagnosis not present

## 2022-08-11 LAB — CUP PACEART INCLINIC DEVICE CHECK
Date Time Interrogation Session: 20230912095514
Implantable Lead Implant Date: 20130816
Implantable Lead Implant Date: 20130816
Implantable Lead Location: 753859
Implantable Lead Location: 753860
Implantable Lead Model: 5076
Implantable Lead Model: 5076
Implantable Pulse Generator Implant Date: 20130816
Lead Channel Impedance Value: 486 Ohm
Lead Channel Impedance Value: 929 Ohm
Lead Channel Pacing Threshold Amplitude: 0.9 V
Lead Channel Pacing Threshold Amplitude: 2 V
Lead Channel Pacing Threshold Pulse Width: 0.4 ms
Lead Channel Pacing Threshold Pulse Width: 0.7 ms
Lead Channel Sensing Intrinsic Amplitude: 0.7 mV
Lead Channel Sensing Intrinsic Amplitude: 19.5 mV
Lead Channel Setting Pacing Amplitude: 2 V
Lead Channel Setting Sensing Sensitivity: 2.5 mV
Pulse Gen Serial Number: 111255

## 2022-08-11 NOTE — Progress Notes (Signed)
HPI [name] returns today to discuss PM system extraction. He is a pleasant 76 yo man with a h/o CAD s/p CABG, followed by atrial flutter s/p ablation, atrial fib s/p ablation twice, and sinus node dysfunction s/p PPM insertion. He appears to now be in atrial fib all of the time and has a well controlled VR. He does not pace. He is approaching ERI on his PM and because the need to pace has gone away since he has been in atrial fib, he is referred to discuss the pro's and con's of catheter ablation. The patient denies chest pain, sob, or syncope. His PM interrogation demonstrates no pacing.  Allergies  Allergen Reactions   Spironolactone     Cramps, dizziness   Cayenne Other (See Comments)    Sweats w/paprika too   Niacin And Related Other (See Comments)    Upset stomach   Sulfa Antibiotics Itching and Rash    "have no idea; mother told me I was allergic to"    Sulfonamide Derivatives Other (See Comments)    "have no idea; mother told me I was allergic to"     Current Outpatient Medications  Medication Sig Dispense Refill   apixaban (ELIQUIS) 5 MG TABS tablet Take 1 tablet (5 mg total) by mouth 2 (two) times daily. 60 tablet 6   atorvastatin (LIPITOR) 40 MG tablet Take 1 tablet (40 mg total) by mouth daily. 90 tablet 1   Cholecalciferol 25 MCG (1000 UT) capsule Take 1,000 Units by mouth daily with supper.      clopidogrel (PLAVIX) 75 MG tablet Take 1 tablet (75 mg total) by mouth daily. 30 tablet 5   isosorbide mononitrate (IMDUR) 60 MG 24 hr tablet Take 1 tablet (60 mg total) by mouth daily. 90 tablet 3   losartan (COZAAR) 25 MG tablet Take 0.5 tablets (12.5 mg total) by mouth daily. (Patient taking differently: Take 25 mg by mouth daily.) 90 tablet 1   nitroGLYCERIN (NITROSTAT) 0.4 MG SL tablet Place 1 tablet (0.4 mg total) under the tongue every 5 (five) minutes as needed. 25 tablet 2   NONFORMULARY OR COMPOUNDED ITEM Apply 1 application topically daily as needed (rash).  Cetaphil + triamcinolone cream     pantoprazole (PROTONIX) 40 MG tablet Take 1 tablet (40 mg total) by mouth 2 (two) times daily. 180 tablet 1   No current facility-administered medications for this visit.     Past Medical History:  Diagnosis Date   Anxiety    Aortic stenosis    Mild, echo, April, 2014   BPH (benign prostatic hyperplasia)    CAD (coronary artery disease)    a. 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 [facility]VI at [facility]   Precancerous lesion    Forehead   Primary osteoarthritis of left knee    Mild   Prolapsed internal hemorrhoids, grade 3 08/12/2015   PVC's (premature ventricular contractions)    Tubular adenoma of colon     ROS:   All systems reviewed  and negative except as noted in the HPI.   Past Surgical History:  Procedure Laterality Date   ABLATION     PVI at [place] 01/25/2019   Procedure: [place];  Surgeon: [name], MD;  Location: [place] CV LAB;  Service: Cardiovascular;  Laterality: N/A;   CARDIOVERSION N/A 10/14/2018   Procedure: CARDIOVERSION;  Surgeon: Fay Records, MD;  Location: [place];  Service: Cardiovascular;  Laterality: N/A;   CARDIOVERSION N/A 08/18/2019   Procedure: CARDIOVERSION;  Surgeon: Jerline Pain, MD;  Location: [facility] ENDOSCOPY;  Service: Cardiovascular;  Laterality: N/A;   COLONOSCOPY     CORONARY ARTERY BYPASS GRAFT  2000   CABG X5   CORONARY STENT INTERVENTION N/A 05/22/2022   Procedure: CORONARY STENT INTERVENTION;  Surgeon: Jettie Booze, MD;  Location: [place] CV LAB;  Service:  Cardiovascular;  Laterality: N/A;   CYSTOSCOPY WITH RETROGRADE PYELOGRAM, URETEROSCOPY AND STENT PLACEMENT Bilateral 09/04/2020   Procedure: CYSTOSCOPY WITH BILATERAL RETROGRADE PYELOGRAM, RIGHT URETEROSCOPY AND RIGHT  STENT PLACEMENT;  Surgeon: Franchot Gallo, MD;  [facility];  Service: Urology;  Laterality: Bilateral;   ENTEROSCOPY N/A 06/06/2022   Procedure: ENTEROSCOPY;  Surgeon: Yetta Flock, MD;  [facility];  Service: Gastroenterology;  Laterality: N/A;   ESOPHAGOGASTRODUODENOSCOPY     HEAD & NECK SKIN LESION EXCISIONAL BIOPSY     HEMORRHOID BANDING     HEMOSTASIS CLIP PLACEMENT  06/06/2022   Procedure: HEMOSTASIS CLIP PLACEMENT;  Surgeon: Yetta Flock, MD;  [facility];  Service: Gastroenterology;;   HOT HEMOSTASIS N/A 06/06/2022   Procedure: HOT HEMOSTASIS (ARGON PLASMA COAGULATION/BICAP);  Surgeon: Yetta Flock, MD;  Location[facility];  Service: Gastroenterology;  Laterality: N/A;   INTRAVASCULAR PRESSURE WIRE/FFR STUDY N/A 01/05/2018   Procedure: INTRAVASCULAR PRESSURE WIRE/FFR STUDY;  Surgeon: Sherren Mocha, MD;  Location: [place] CV LAB;  Service: Cardiovascular;  Laterality: N/A;   INTRAVASCULAR ULTRASOUND/IVUS N/A 05/22/2022   Procedure: Intravascular Ultrasound/IVUS;  Surgeon: Jettie Booze, MD;  Location: [place] CV LAB;  Service: Cardiovascular;  Laterality: N/A;   LEFT HEART CATH AND CORS/GRAFTS ANGIOGRAPHY N/A 01/05/2018   Procedure: LEFT HEART CATH AND CORS/GRAFTS ANGIOGRAPHY;  Surgeon: Sherren Mocha, MD;  Location: [place] CV LAB;  Service: Cardiovascular;  Laterality: N/A;   PERMANENT PACEMAKER INSERTION N/A 07/15/2012   Procedure: PERMANENT PACEMAKER INSERTION;  Surgeon: Deboraha Sprang, MD;  Location: [facility] CATH LAB;  Service: Cardiovascular;  Laterality: N/A;   RIGHT/LEFT HEART CATH AND CORONARY/GRAFT ANGIOGRAPHY N/A 05/22/2022   Procedure: RIGHT/LEFT HEART CATH AND CORONARY/GRAFT ANGIOGRAPHY;  Surgeon:  Jettie Booze, MD;  Location: [place] CV LAB;  Service: Cardiovascular;  Laterality: N/A;   rotator cuff surgery Right 2017   SCLEROTHERAPY  06/06/2022   Procedure: SCLEROTHERAPY;  Surgeon: Yetta Flock, MD;  [facility];  Service: Gastroenterology;;   TONSILLECTOMY  1956   WISDOM TOOTH EXTRACTION       Family History  Problem Relation Age of Onset   Coronary artery disease Other 32   Diabetes Other    Multiple myeloma Mother    Diabetes Father    Renal Disease Father    Hyperlipidemia Other    Hypertension Other    Colon cancer Neg Hx    Esophageal cancer Neg Hx    Stomach cancer Neg Hx    Rectal cancer Neg Hx      Social History   Socioeconomic History   Marital status:  Married    Spouse name: Not on file   Number of children: 2   Years of education: 30   Highest education level: Not on file  Occupational History   Occupation: Technical brewer: [employer]    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  2 daughters-'75, '79; 1 grandchild; step-daughter and step grandson.  Regular exercise-yes, runs 1.5-2 mi 4x/wk, also eliptical      Patient signed a Designated Party Release to allow his wife, Russell Griffith, to have access to his medical records/ information.   Social Determinants of Health   Financial Resource Strain: Low Risk  (01/05/2022)   Overall Financial Resource Strain (CARDIA)    Difficulty of Paying Living Expenses: Not hard at all  Food Insecurity: No Food Insecurity  (01/05/2022)   Hunger Vital Sign    Worried About Running Out of Food in the Last Year: Never true    Ran Out of Food in the Last Year: Never true  Transportation Needs: No Transportation Needs (01/05/2022)   PRAPARE - Hydrologist (Medical): No    Lack of Transportation (Non-Medical): No  Physical Activity: Sufficiently Active (01/05/2022)   Exercise Vital Sign    Days of Exercise per Week: 7 days    Minutes of Exercise per Session: 60 min  Stress: No Stress Concern Present (01/05/2022)   [place]    Feeling of Stress : Not at all  Social Connections: [place] (01/05/2022)   Social Connection and Isolation Panel [NHANES]    Frequency of Communication with Friends and Family: More than three times a week    Frequency of Social Gatherings with Friends and Family: More than three times a week    Attends Religious Services: More than 4 times per year    Active Member of [employer] or Organizations: Yes    Attends Music therapist: More than 4 times per year    Marital Status: Married  Human resources officer Violence: Not At Risk (01/05/2022)   Humiliation, Afraid, Rape, and Kick questionnaire    Fear of Current or Ex-Partner: No    Emotionally Abused: No    Physically Abused: No    Sexually Abused: No     BP 122/63   Pulse 67   Ht '5\' 7"'  (1.702 m)   Wt 190 lb (86.2 kg)   SpO2 97%   BMI 29.76 kg/m   Physical Exam:  Well appearing NAD HEENT: Unremarkable Neck:  No JVD, no thyromegally Lymphatics:  No adenopathy Back:  No CVA tenderness Lungs:  Clear with no wheezes HEART:  Regular rate rhythm, no murmurs, no rubs, no clicks Abd:  soft, positive bowel sounds, no organomegally, no rebound, no guarding Ext:  2 plus pulses, no edema, no cyanosis, no clubbing Skin:  No rashes no nodules Neuro:  CN II through XII intact, motor grossly intact   DEVICE  Normal device function.  See PaceArt for details.   Assess/Plan:  PPM - we discussed the pro's and con's of PM system extraction vs watchful waiting. At this point, I would not recommend extraction despite his not needing the PM. As his VR is controlled, he may develop progressive conduction system disease. He has asked about MRI. I think an MRI in his case with a non-MRI approved PPM would be MUCH less risky than a PM lead extraction. If he had to have an MRI, we could have him go to [facility] as they have done these for 20 + years. Sinus node dysfunction - resolved as he is now in permanent atrial fib. Perm atrial fib - his VR is well controlled. No change in meds. CAD - he is s/p cabg and denies anginal symptoms.  Russell Overlie Kendel Pesnell,MD

## 2022-08-11 NOTE — Patient Instructions (Addendum)
Medication Instructions:  Your physician recommends that you continue on your current medications as directed. Please refer to the Current Medication list given to you today.  *If you need a refill on your cardiac medications before your next appointment, please call your pharmacy*  Lab Work: None ordered.  If you have labs (blood work) drawn today and your tests are completely normal, you will receive your results only by: [place] (if you have MyChart) OR A paper copy in the mail If you have any lab test that is abnormal or we need to change your treatment, we will call you to review the results.  Testing/Procedures: None ordered.  Follow-Up: At [facility], you and your health needs are our priority.  As part of our continuing mission to provide you with exceptional heart care, we have created designated Provider Care Teams.  These Care Teams include your primary Cardiologist (physician) and Advanced Practice Providers (APPs -  Physician Assistants and Nurse Practitioners) who all work together to provide you with the care you need, when you need it.  We recommend signing up for the patient portal called "MyChart".  Sign up information is provided on this After Visit Summary.  MyChart is used to connect with patients for Virtual Visits (Telemedicine).  Patients are able to view lab/test results, encounter notes, upcoming appointments, etc.  Non-urgent messages can be sent to your provider as well.   To learn more about what you can do with MyChart, go to [email or web address].    Your next appointment:   AS NEEDED    The format for your next appointment:   In Person  Provider:   Cristopher Peru, MD{or one of the following Advanced Practice Providers on your designated Care Team:   Tommye Standard, [place] Legrand Como "Jonni Sanger" Chalmers Cater, [place]  Remote monitoring is used to monitor your Pacemaker from home. This monitoring reduces the number of office visits required to check your device  to one time per year. It allows [place] to keep an eye on the functioning of your device to ensure it is working properly. You are scheduled for a device check from home on 09/18/22. You may send your transmission at any time that day. If you have a wireless device, the transmission will be sent automatically. After your physician reviews your transmission, you will receive a postcard with your next transmission date.  Important Information About Sugar

## 2022-08-29 IMAGING — CT CT ABD-PELV W/ CM
2 of 5 series · 16 of 46 positions shown, 18 images · IV contrast (agent unspecified)
Comparison: 08/17/2020

CLINICAL DATA: Nausea and vomiting, generalized weakness for 1
week, jaundice

EXAM:
CT ABDOMEN AND PELVIS WITH CONTRAST
TECHNIQUE: Multidetector CT imaging of the abdomen and pelvis was performed
using the standard protocol following bolus administration of
intravenous contrast.

[Series 2: axial st · axial · 0.84mm/px · z∈[+974,+1389]mm · 13 of 97 slices shown, 15 images]
[im 7/97  soft-tissue]
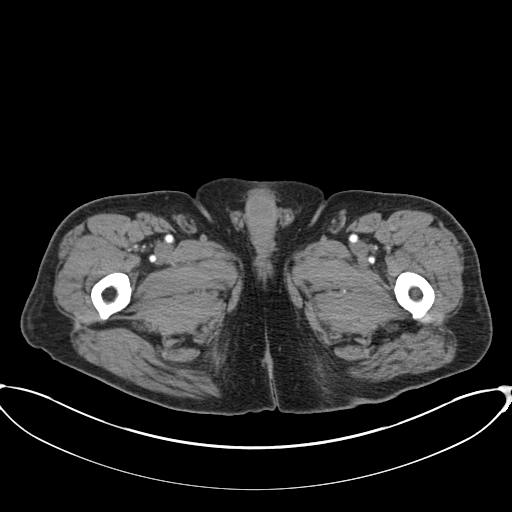
[im 7/97  bone]
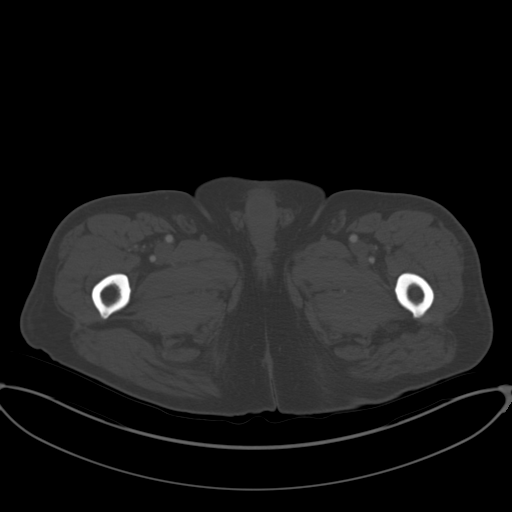
[im 13/97  soft-tissue]
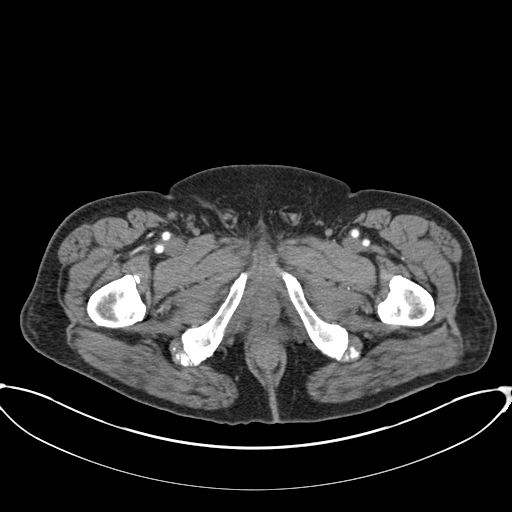
[im 20/97  soft-tissue]
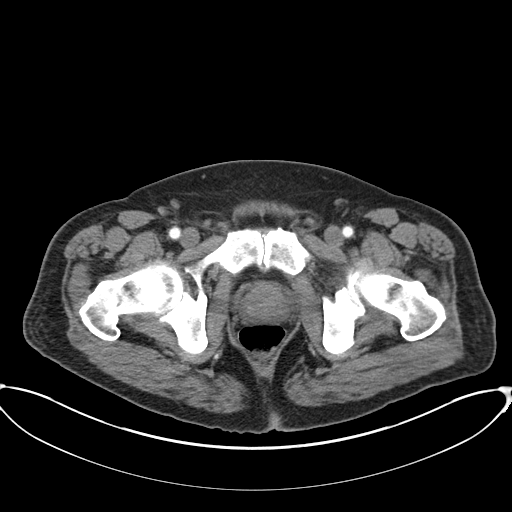
[im 26/97  soft-tissue]
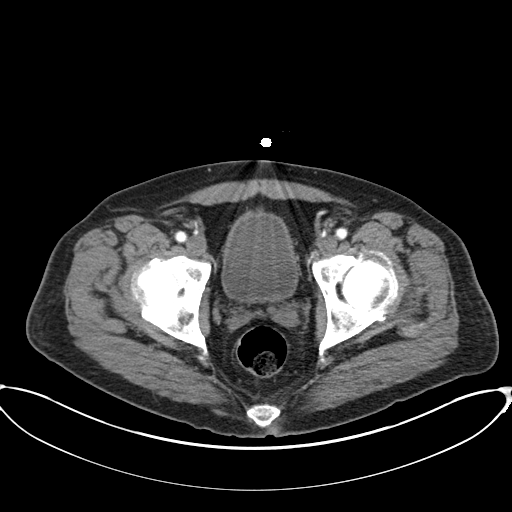
[im 33/97  soft-tissue]
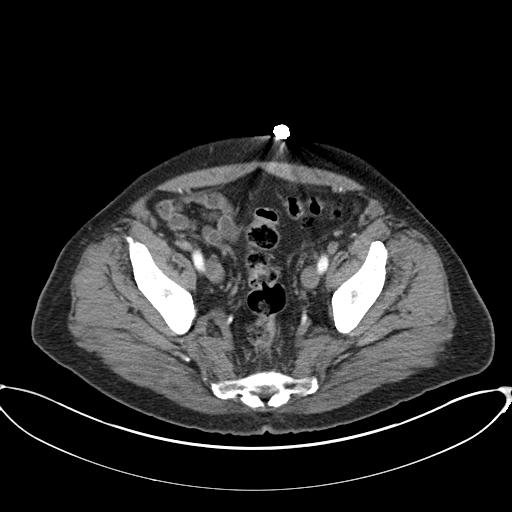
[im 39/97  soft-tissue]
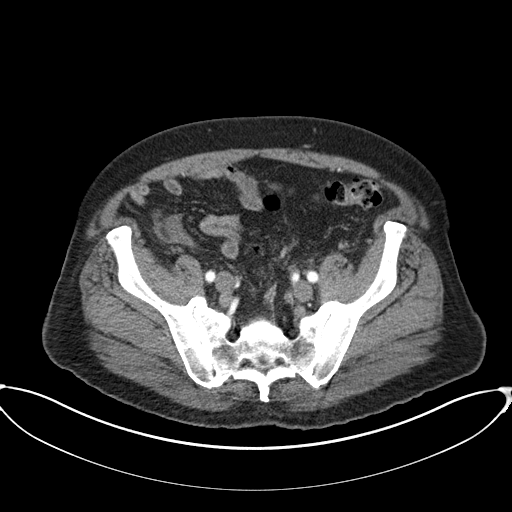
[im 52/97  soft-tissue]
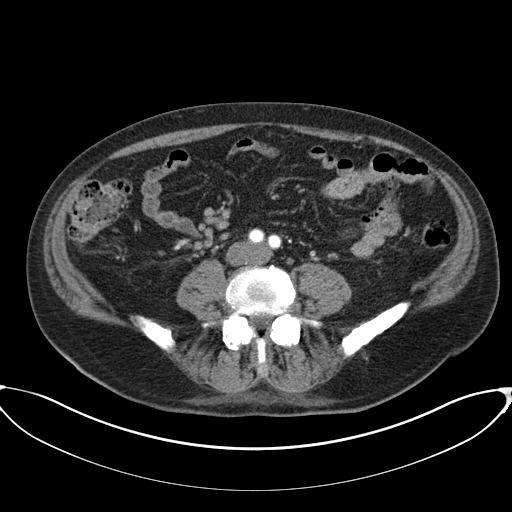
[im 58/97  soft-tissue]
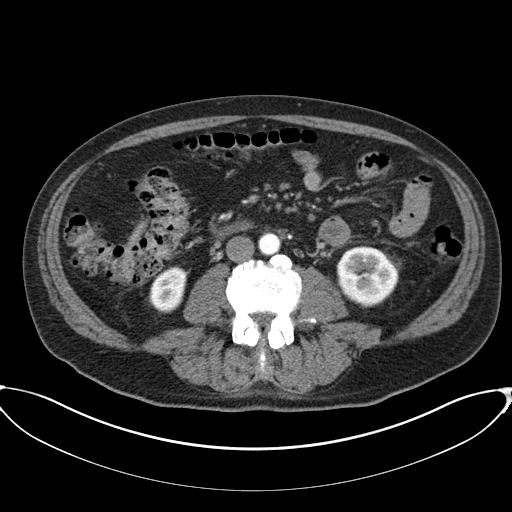
[im 65/97  soft-tissue]
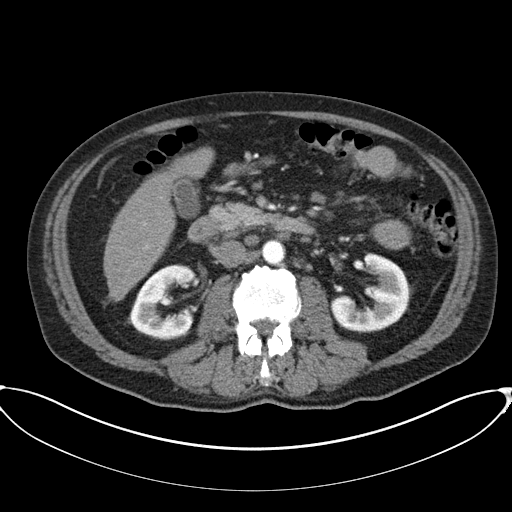
[im 65/97  bone]
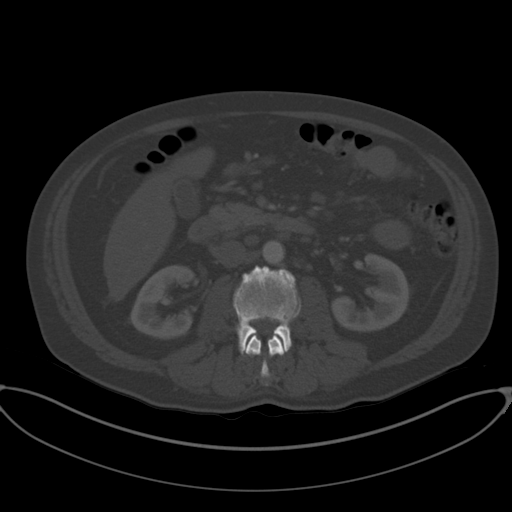
[im 71/97  soft-tissue]
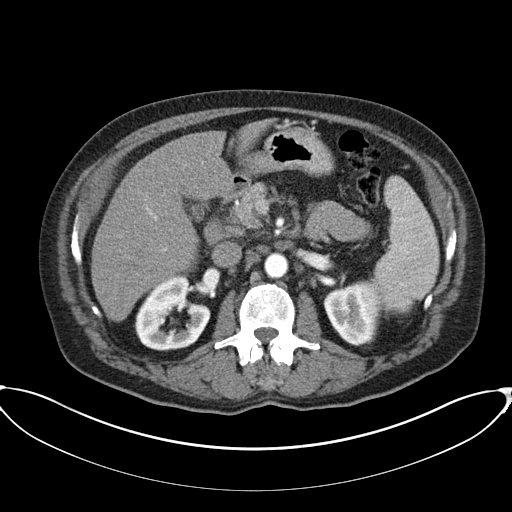
[im 77/97  soft-tissue]
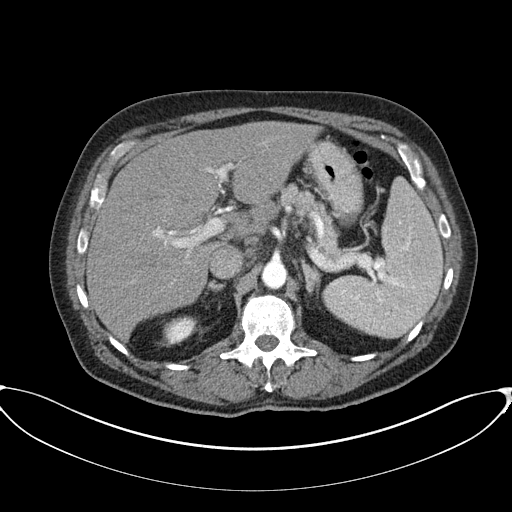
[im 84/97  soft-tissue]
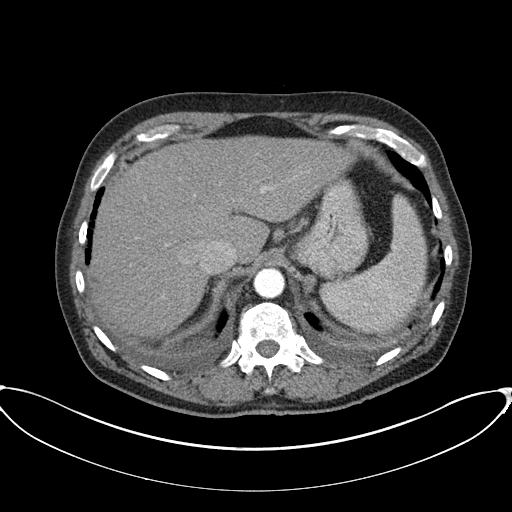
[im 90/97  soft-tissue]
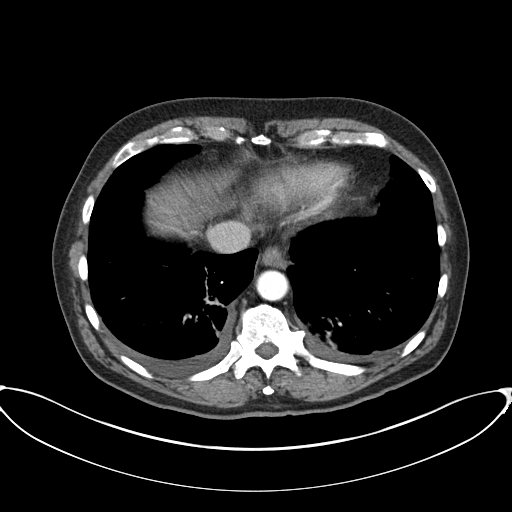

[Series 5: coronal st · coronal · 0.83mm/px · 3 of 151 slices shown]
[im 51/151  soft-tissue]
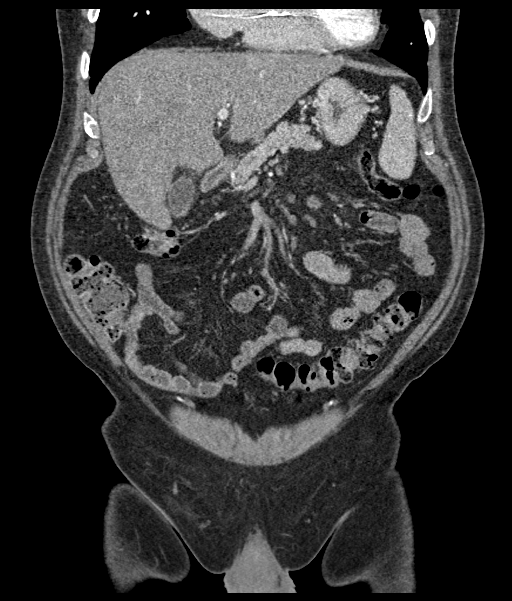
[im 67/151  soft-tissue]
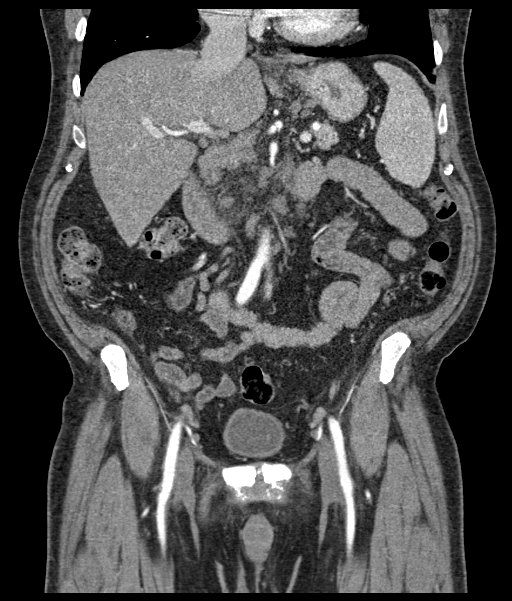
[im 84/151  soft-tissue]
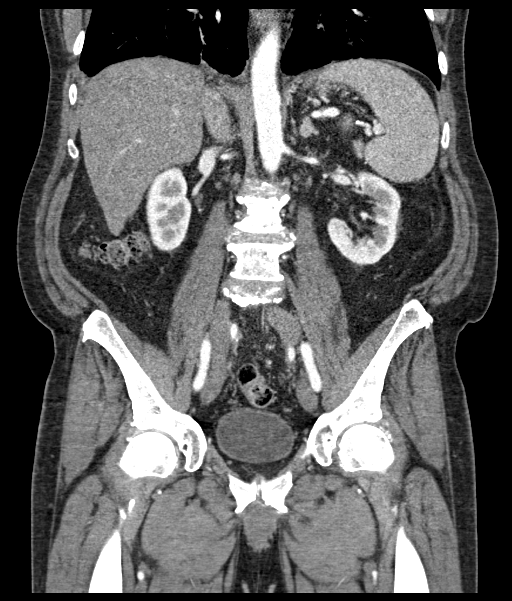

[16 of 46 positions shown; findings below may reference images not displayed]

RADIATION DOSE REDUCTION: This exam was performed according to the
departmental dose-optimization program which includes automated
exposure control, adjustment of the mA and/or kV according to
patient size and/or use of iterative reconstruction technique.

CONTRAST:  100mL OMNIPAQUE IOHEXOL 300 MG/ML  SOLN
FINDINGS: Lower chest: Trace bilateral pleural effusions. No acute airspace
disease. Minimal bibasilar atelectasis. Pacemaker lead within the
right ventricle.

Hepatobiliary: Diffuse hepatic steatosis. No focal liver abnormality
or intrahepatic biliary duct dilation. The gallbladder is
decompressed, with no evidence of cholelithiasis or cholecystitis.
No evidence of common bile duct dilation or choledocholithiasis.

Pancreas: Unremarkable. No pancreatic ductal dilatation or
surrounding inflammatory changes.

Spleen: Stable borderline splenomegaly, measuring up to 13 cm in
length. No focal parenchymal abnormalities.

Adrenals/Urinary Tract: Punctate 3 mm nonobstructing left renal
calculus unchanged. No obstructive uropathy within either kidney.
Normal bilateral renal parenchymal enhancement. Adrenals and bladder
are grossly unremarkable.

Stomach/Bowel: No bowel obstruction or ileus. Normal appendix right
lower quadrant. No bowel wall thickening or inflammatory change.

Vascular/Lymphatic: Mild atherosclerosis of the aorta again noted.
Borderline enlarged adenopathy within the porta hepatis and upper
abdominal retroperitoneum, with index lymph node in the porta
hepatis measuring 16 mm in short axis reference image [DATE].
Previously this had measured approximately 14 mm. No new pathologic
adenopathy.

Reproductive: Prostate is unremarkable.

Other: No free fluid or free gas.  No abdominal wall hernia.

Musculoskeletal: No acute or destructive bony lesions. Reconstructed
images demonstrate no additional findings.
IMPRESSION: 1. No evidence of cholelithiasis, cholecystitis, or
choledocholithiasis. No biliary duct dilation.
2. Hepatic steatosis.
3. Stable borderline splenomegaly.
4. Trace bilateral pleural effusions.
5. Stable 3 mm nonobstructing left renal calculus.
6. Stable upper abdominal retroperitoneal and porta hepatis lymph
nodes, likely reactive.
7.  Aortic Atherosclerosis ([id number]-KJD.D).

## 2022-09-04 ENCOUNTER — Telehealth: Payer: Self-pay

## 2022-09-04 NOTE — Telephone Encounter (Signed)
Device Alert:       Spoke with patient informed him of the above alert, patient agreeable to appointment with Dr. Caryl Comes on 09/08/22 at 1:45pm  to discuss, patient would also like to discuss with [facility] BSX rep.

## 2022-09-08 ENCOUNTER — Encounter: Payer: Self-pay | Admitting: Internal Medicine

## 2022-09-08 ENCOUNTER — Ambulatory Visit: Payer: PPO | Attending: Internal Medicine | Admitting: Internal Medicine

## 2022-09-08 VITALS — BP 124/68 | HR 72 | Ht 67.0 in | Wt 189.4 lb

## 2022-09-08 DIAGNOSIS — I4891 Unspecified atrial fibrillation: Secondary | ICD-10-CM

## 2022-09-08 DIAGNOSIS — Z950 Presence of cardiac pacemaker: Secondary | ICD-10-CM | POA: Diagnosis not present

## 2022-09-08 DIAGNOSIS — Z01812 Encounter for preprocedural laboratory examination: Secondary | ICD-10-CM | POA: Diagnosis not present

## 2022-09-08 DIAGNOSIS — I491 Atrial premature depolarization: Secondary | ICD-10-CM | POA: Diagnosis not present

## 2022-09-08 DIAGNOSIS — I495 Sick sinus syndrome: Secondary | ICD-10-CM | POA: Diagnosis not present

## 2022-09-08 LAB — CBC
Hematocrit: 34.5 % — ABNORMAL LOW (ref 37.5–51.0)
Hemoglobin: 11.5 g/dL — ABNORMAL LOW (ref 13.0–17.7)
MCH: 25.7 pg — ABNORMAL LOW (ref 26.6–33.0)
MCHC: 33.3 g/dL (ref 31.5–35.7)
MCV: 77 fL — ABNORMAL LOW (ref 79–97)
Platelets: 192 10*3/uL (ref 150–450)
RBC: 4.48 x10E6/uL (ref 4.14–5.80)
RDW: 18.1 % — ABNORMAL HIGH (ref 11.6–15.4)
WBC: 5.7 10*3/uL (ref 3.4–10.8)

## 2022-09-08 LAB — BASIC METABOLIC PANEL
BUN/Creatinine Ratio: 17 (ref 10–24)
BUN: 19 mg/dL (ref 8–27)
CO2: 29 mmol/L (ref 20–29)
Calcium: 10.1 mg/dL (ref 8.6–10.2)
Chloride: 105 mmol/L (ref 96–106)
Creatinine, Ser: 1.09 mg/dL (ref 0.76–1.27)
Glucose: 104 mg/dL — ABNORMAL HIGH (ref 70–99)
Potassium: 4.3 mmol/L (ref 3.5–5.2)
Sodium: 141 mmol/L (ref 134–144)
eGFR: 71 mL/min/{1.73_m2} (ref >59)

## 2022-09-08 MED ORDER — FUROSEMIDE 20 MG PO TABS: ORAL_TABLET | ORAL | 1 refills | Status: DC

## 2022-09-08 NOTE — Patient Instructions (Addendum)
Medication Instructions:  Your physician has recommended you make the following change in your medication:  TAKE Lasix 20 mg daily for 5 days, then take it once daily AS NEEDED  *If you need a refill on your cardiac medications before your next appointment, please call your pharmacy*   Lab Work: Pre procedure lab work today: BMET & CBC  If you have labs (blood work) drawn today and your tests are completely normal, you will receive your results only by: [employer] (if you have MyChart) OR A paper copy in the mail If you have any lab test that is abnormal or we need to change your treatment, we will call you to review the results.   Testing/Procedures: None ordered   Follow-Up: At [facility], you and your health needs are our priority.  As part of our continuing mission to provide you with exceptional heart care, we have created designated Provider Care Teams.  These Care Teams include your primary Cardiologist (physician) and Advanced Practice Providers (APPs -  Physician Assistants and Nurse Practitioners) who all work together to provide you with the care you need, when you need it.  Remote monitoring is used to monitor your Pacemaker or ICD from home. This monitoring reduces the number of office visits required to check your device to one time per year. It allows [place] to keep an eye on the functioning of your device to ensure it is working properly. You are scheduled for a device check from home on 09/18/2022. You may send your transmission at any time that day. If you have a wireless device, the transmission will be sent automatically. After your physician reviews your transmission, you will receive a postcard with your next transmission date.  Your next appointment:   2 week(s)  The format for your next appointment:   In Person  Provider:   Device clinic for a wound check     Thank you for choosing [facility]!!   Trinidad Curet, RN [phone]    Other  Instructions     Important Information About Sugar

## 2022-09-08 NOTE — Progress Notes (Signed)
Patient Care Team: [name], Evie Lacks, MD as PCP - General (Internal Medicine) Deboraha Sprang, MD as PCP - Electrophysiology (Cardiology) Minus Breeding, MD as PCP - Cardiology (Cardiology) Stefanie Libel, MD (Family Medicine) Deboraha Sprang, MD (Cardiology) Jarome Matin, MD as Consulting Physician (Dermatology) Milus Banister, MD as Attending Physician (Gastroenterology) Minus Breeding, MD as Consulting Physician (Cardiology) Franchot Gallo, MD as Consulting Physician (Urology) Luberta Mutter, MD as Consulting Physician (Ophthalmology)   HPI  Russell Griffith is a 76 y.o. male for pacemaker implantation for significant pausing in the context of now permanent atrial fibrillation and previous sinus node dysfunction  Hx pulmonary vein [facility] at [facility] 2/20.  t   Coronary artery disease with bypass surgery 2000 and a Myoview November 2013 demonstrating no ischemia or scar. LV function was normal by echo February 2014 Had a history of exercise-induced polymorphic ventricular tachycardia, prompting  catheterization which demonstrated occlusion of 1 of his vein grafts but other conduits were open DATE TEST EF   2/19 LHC  55-60 % LIMA-LAD/SVG-D, SVG-PDA Patent SVG AM-OM2  12/19 Echo  60-65% LAE 41/2.0/33         Date Cr K Hgb  9/23 1.09 3.8 10.8<<6.6          Because of chronotropic incompetence, we made more aggressive his rate response.  This was associated with significant improvement.   Subsequently his device was reprogrammed AAIR to prevent inappropriate ventricular pacing as we were seen R on T.    Initial implant indication be in sinus node dysfunction atrial fibrillation now permanent he was referred to Dr. Elliot Cousin to consider device explantation.  I have elected to watch for right now, one of the concerns was that he had relatively controlled ventricular response and might be evidence of underlying conduction system disease which might progress.  When last  seen he was noted to be iron deficient anemic; following stenting x2 6/23, presented with GI bleeding from the second portion of the duodenum treated with fulguration and argon laser and clipping.  Apparently also has gastric polyps.  Continues to be tired.  Anemic.  Some peripheral edema.  Dyspnea on exertion; no chest pain       Atrial Fibrillation Management history:   Previous antiarrhythmic drugs: Tikosyn, Multaq Previous cardioversions: 10/14/18, 08/18/19 Previous ablations: remote at [facility], 01/25/19 CHADS2VASC score: 3 (age, HTN, CAD) Anticoagulation history: Xarelto         Past Medical History:  Diagnosis Date   Anxiety    Aortic stenosis    Mild, echo, April, 2014   BPH (benign prostatic hyperplasia)    CAD (coronary artery disease)    a. 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 s/p PVI at [facility]   Precancerous lesion    Forehead   Primary osteoarthritis of left knee    Mild   Prolapsed internal hemorrhoids, grade 3 08/12/2015   PVC's (premature ventricular contractions)    Tubular adenoma of colon     Past Surgical History:  Procedure Laterality Date  ABLATION     PVI at [place] 01/25/2019   Procedure: ATRIAL FIBRILLATION ABLATION;  Surgeon: [name], MD;  Location: [place] CV LAB;  Service: Cardiovascular;  Laterality: N/A;   CARDIOVERSION N/A 10/14/2018   Procedure: CARDIOVERSION;  Surgeon: Fay Records, MD;  Location: [place];  Service: Cardiovascular;  Laterality: N/A;   CARDIOVERSION N/A 08/18/2019   Procedure: CARDIOVERSION;  Surgeon: Jerline Pain, MD;  Location: [facility]COPY;  Service: Cardiovascular;  Laterality: N/A;   COLONOSCOPY     CORONARY ARTERY BYPASS GRAFT  2000   CABG X5   CORONARY STENT INTERVENTION N/A 05/22/2022   Procedure: CORONARY STENT INTERVENTION;  Surgeon: Jettie Booze, MD;  Location: [place] CV LAB;  Service: Cardiovascular;  Laterality: N/A;   CYSTOSCOPY WITH RETROGRADE PYELOGRAM, URETEROSCOPY AND STENT PLACEMENT Bilateral 09/04/2020   Procedure: CYSTOSCOPY WITH BILATERAL RETROGRADE PYELOGRAM, RIGHT URETEROSCOPY AND RIGHT  STENT PLACEMENT;  Surgeon: Franchot Gallo, MD;  Location: [facility] ORS;  Service: Urology;  Laterality: Bilateral;   ENTEROSCOPY N/A 06/06/2022   Procedure: ENTEROSCOPY;  Surgeon: Yetta Flock, MD;  Location: [facility] ENDOSCOPY;  Service: Gastroenterology;  Laterality: N/A;   ESOPHAGOGASTRODUODENOSCOPY     HEAD & NECK SKIN LESION EXCISIONAL BIOPSY     HEMORRHOID BANDING     HEMOSTASIS CLIP PLACEMENT  06/06/2022   Procedure: HEMOSTASIS CLIP PLACEMENT;  Surgeon: Yetta Flock, MD;  Location: [facility] ENDOSCOPY;  Service: Gastroenterology;;   HOT HEMOSTASIS N/A 06/06/2022   Procedure: HOT HEMOSTASIS (ARGON PLASMA COAGULATION/BICAP);  Surgeon: Yetta Flock, MD;  Location: [facility];  Service: Gastroenterology;  Laterality: N/A;   INTRAVASCULAR PRESSURE WIRE/FFR STUDY N/A 01/05/2018   Procedure: INTRAVASCULAR PRESSURE WIRE/FFR STUDY;  Surgeon: Sherren Mocha, MD;  Location: [place] CV LAB;  Service: Cardiovascular;  Laterality: N/A;   INTRAVASCULAR ULTRASOUND/IVUS N/A 05/22/2022   Procedure: Intravascular Ultrasound/IVUS;  Surgeon: Jettie Booze, MD;  Location: [place] CV LAB;  Service: Cardiovascular;  Laterality: N/A;   LEFT HEART CATH AND CORS/GRAFTS ANGIOGRAPHY N/A 01/05/2018   Procedure: LEFT HEART CATH AND CORS/GRAFTS ANGIOGRAPHY;  Surgeon: Sherren Mocha, MD;  Location: [place] CV LAB;  Service: Cardiovascular;  Laterality: N/A;   PERMANENT PACEMAKER INSERTION  N/A 07/15/2012   Procedure: PERMANENT PACEMAKER INSERTION;  Surgeon: Deboraha Sprang, MD;  Location: [facility] CATH LAB;  Service: Cardiovascular;  Laterality: N/A;   RIGHT/LEFT HEART CATH AND CORONARY/GRAFT ANGIOGRAPHY N/A 05/22/2022   Procedure: RIGHT/LEFT HEART CATH AND CORONARY/GRAFT ANGIOGRAPHY;  Surgeon: Jettie Booze, MD;  Location: [place] CV LAB;  Service: Cardiovascular;  Laterality: N/A;   rotator cuff surgery Right 2017   SCLEROTHERAPY  06/06/2022   Procedure: SCLEROTHERAPY;  Surgeon: Yetta Flock, MD;  Location: [facility] ENDOSCOPY;  Service: Gastroenterology;;   TONSILLECTOMY  1956   WISDOM TOOTH EXTRACTION      Current Outpatient Medications  Medication Sig Dispense Refill   apixaban (ELIQUIS) 5 MG TABS tablet Take 1 tablet (5 mg total) by mouth 2 (two) times daily. 60 tablet 6   atorvastatin (LIPITOR) 40 MG tablet Take 1 tablet (40 mg total) by mouth daily. 90 tablet 1   Cholecalciferol 25 MCG (1000 UT) capsule Take 1,000 Units by mouth daily with supper.      clopidogrel (PLAVIX) 75 MG tablet Take 1 tablet (75 mg total) by mouth daily. 30 tablet 5   isosorbide mononitrate (IMDUR) 60 MG 24 hr tablet Take 1 tablet (60 mg total) by mouth daily. 90 tablet  3   losartan (COZAAR) 25 MG tablet Take 0.5 tablets (12.5 mg total) by mouth daily. (Patient taking differently: Take 25 mg by mouth daily.) 90 tablet 1   nitroGLYCERIN (NITROSTAT) 0.4 MG SL tablet Place 1 tablet (0.4 mg total) under the tongue every 5 (five) minutes as needed. 25 tablet 2   NONFORMULARY OR COMPOUNDED ITEM Apply 1 application topically daily as needed (rash). Cetaphil + triamcinolone cream     pantoprazole (PROTONIX) 40 MG tablet Take 1 tablet (40 mg total) by mouth 2 (two) times daily. (Patient taking differently: Take 40 mg by mouth daily.) 180 tablet 1   No current facility-administered medications for this visit.    Allergies  Allergen Reactions   Spironolactone     Cramps, dizziness   Cayenne Other  (See Comments)    Sweats w/paprika too   Niacin And Related Other (See Comments)    Upset stomach   Sulfa Antibiotics Itching and Rash    "have no idea; mother told me I was allergic to"    Sulfonamide Derivatives Other (See Comments)    "have no idea; mother told me I was allergic to"    Review of Systems negative except from HPI and PMH  Physical Exam  BP 124/68   Pulse 72   Ht '5\' 7"'$  (1.702 m)   Wt 189 lb 6.4 oz (85.9 kg)   SpO2 97%   BMI 29.66 kg/m  Well developed and well nourished in no acute distress HENT normal Neck supple with JVP-8-10 Clear Device pocket well healed; without hematoma or erythema.  There is no tethering  Regular rate and rhythm controlled ventricular response no what gallop 2/6 murmur Abd-soft with active BS No Clubbing cyanosis 2+ edema Skin-warm and dry A & Oriented  Grossly normal sensory and motor function  ECG ventricular pacing at 72   ECG  Atrial fib @ 61  -/10/43 ECG 5/22  Afib  ECG 9/21 AFib Assessment and  Plam  PACs  Atrial fibrillation s/p PVI [facility] and JA 2020 now permanent  Sinus node dysfunction  Ischemic heart disease with prior bypass surgery  Pacemaker  [employer]  Pacemaker function-abnormal resulting in R on T pacing ( multiple reprogramings)  Anemia associated with GI bleeding   HFpEF   His device is reached ERI and has now moved into a default mode of VVI pacing at 72.  Previous discussion with Dr. Elliot Cousin suggested that with his underlying relative bradycardia, there may be a conduction system disease and so in light of the Default pacing here we have elected to proceed with device generator replacement and we will switch from [place] to [employer] in light of MRI needs.  With his recent issue of GI bleeding, I have raised the issue with Dr. [facility] as to whether we should refer him for watchman.  It would be my recommendation.  In addition, he remains fatigued.  His last hemoglobin had continued to  drift down following his hospitalization in June/July.  We will recheck his CBC and then we will consider whether he would benefit from IV iron as he has not tolerated p.o. iron here to date.  Volume overloaded.  We will put him on a low-dose diuretic furosemide 20 mg daily x5 days and then as needed

## 2022-09-08 NOTE — H&P (View-Only) (Signed)
Patient Care Team: [name], Evie Lacks, MD as PCP - General (Internal Medicine) Deboraha Sprang, MD as PCP - Electrophysiology (Cardiology) Minus Breeding, MD as PCP - Cardiology (Cardiology) Stefanie Libel, MD (Family Medicine) Deboraha Sprang, MD (Cardiology) Jarome Matin, MD as Consulting Physician (Dermatology) Milus Banister, MD as Attending Physician (Gastroenterology) Minus Breeding, MD as Consulting Physician (Cardiology) Franchot Gallo, MD as Consulting Physician (Urology) Luberta Mutter, MD as Consulting Physician (Ophthalmology)   HPI  Russell Griffith is a 76 y.o. male for pacemaker implantation for significant pausing in the context of now permanent atrial fibrillation and previous sinus node dysfunction  Hx pulmonary vein isolation at [facility] and [facility] 2/20.  t   Coronary artery disease with bypass surgery 2000 and a Myoview November 2013 demonstrating no ischemia or scar. LV function was normal by echo February 2014 Had a history of exercise-induced polymorphic ventricular tachycardia, prompting  catheterization which demonstrated occlusion of 1 of his vein grafts but other conduits were open DATE TEST EF   2/19 LHC  55-60 % LIMA-LAD/SVG-D, SVG-PDA Patent SVG AM-OM2  12/19 Echo  60-65% LAE 41/2.0/33         Date Cr K Hgb  9/23 1.09 3.8 10.8<<6.6          Because of chronotropic incompetence, we made more aggressive his rate response.  This was associated with significant improvement.   Subsequently his device was reprogrammed AAIR to prevent inappropriate ventricular pacing as we were seen R on T.    Initial implant indication be in sinus node dysfunction atrial fibrillation now permanent he was referred to Dr. Elliot Cousin to consider device explantation.  I have elected to watch for right now, one of the concerns was that he had relatively controlled ventricular response and might be evidence of underlying conduction system disease which might progress.  When last  seen he was noted to be iron deficient anemic; following stenting x2 6/23, presented with GI bleeding from the second portion of the duodenum treated with fulguration and argon laser and clipping.  Apparently also has gastric polyps.  Continues to be tired.  Anemic.  Some peripheral edema.  Dyspnea on exertion; no chest pain       Atrial Fibrillation Management history:   Previous antiarrhythmic drugs: Tikosyn, Multaq Previous cardioversions: 10/14/18, 08/18/19 Previous ablations: remote at [facility], 01/25/19 CHADS2VASC score: 3 (age, HTN, CAD) Anticoagulation history: Xarelto         Past Medical History:  Diagnosis Date   Anxiety    Aortic stenosis    Mild, echo, April, 2014   BPH (benign prostatic hyperplasia)    CAD (coronary artery disease)    a. 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 s/p PVI at [facility]   Precancerous lesion    Forehead   Primary osteoarthritis of left knee    Mild   Prolapsed internal hemorrhoids, grade 3 08/12/2015   PVC's (premature ventricular contractions)    Tubular adenoma of colon     Past Surgical History:  Procedure Laterality Date  ABLATION     PVI at [place] 01/25/2019   Procedure: ATRIAL FIBRILLATION ABLATION;  Surgeon: [name], MD;  Location: [place] CV LAB;  Service: Cardiovascular;  Laterality: N/A;   CARDIOVERSION N/A 10/14/2018   Procedure: CARDIOVERSION;  Surgeon: Fay Records, MD;  Location: [place];  Service: Cardiovascular;  Laterality: N/A;   CARDIOVERSION N/A 08/18/2019   Procedure: CARDIOVERSION;  Surgeon: Jerline Pain, MD;  Location: [facility] ENDOSCOPY;  Service: Cardiovascular;  Laterality: N/A;   COLONOSCOPY     CORONARY ARTERY BYPASS GRAFT  2000   CABG X5   CORONARY STENT INTERVENTION N/A 05/22/2022   Procedure: CORONARY STENT INTERVENTION;  Surgeon: Jettie Booze, MD;  Location: [place] [facility] LAB;  Service: Cardiovascular;  Laterality: N/A;   CYSTOSCOPY WITH RETROGRADE PYELOGRAM, URETEROSCOPY AND STENT PLACEMENT Bilateral 09/04/2020   Procedure: CYSTOSCOPY WITH BILATERAL RETROGRADE PYELOGRAM, RIGHT URETEROSCOPY AND RIGHT  STENT PLACEMENT;  Surgeon: Franchot Gallo, MD;  Location: [facility];  Service: Urology;  Laterality: Bilateral;   ENTEROSCOPY N/A 06/06/2022   Procedure: ENTEROSCOPY;  Surgeon: Yetta Flock, MD;  Location: [facility] ENDOSCOPY;  Service: Gastroenterology;  Laterality: N/A;   ESOPHAGOGASTRODUODENOSCOPY     HEAD & NECK SKIN LESION EXCISIONAL BIOPSY     HEMORRHOID BANDING     HEMOSTASIS CLIP PLACEMENT  06/06/2022   Procedure: HEMOSTASIS CLIP PLACEMENT;  Surgeon: Yetta Flock, MD;  Location: [facility] ENDOSCOPY;  Service: Gastroenterology;;   HOT HEMOSTASIS N/A 06/06/2022   Procedure: HOT HEMOSTASIS (ARGON PLASMA COAGULATION/BICAP);  Surgeon: Yetta Flock, MD;  Location: [facility];  Service: Gastroenterology;  Laterality: N/A;   INTRAVASCULAR PRESSURE WIRE/FFR STUDY N/A 01/05/2018   Procedure: INTRAVASCULAR PRESSURE WIRE/FFR STUDY;  Surgeon: Sherren Mocha, MD;  Location: [place] [facility];  Service: Cardiovascular;  Laterality: N/A;   INTRAVASCULAR ULTRASOUND/IVUS N/A 05/22/2022   Procedure: Intravascular Ultrasound/IVUS;  Surgeon: Jettie Booze, MD;  Location: [place] [facility];  Service: Cardiovascular;  Laterality: N/A;   LEFT HEART CATH AND CORS/GRAFTS ANGIOGRAPHY N/A 01/05/2018   Procedure: LEFT HEART CATH AND CORS/GRAFTS ANGIOGRAPHY;  Surgeon: Sherren Mocha, MD;  Location: [place] [facility] LAB;  Service: Cardiovascular;  Laterality: N/A;   PERMANENT PACEMAKER INSERTION  N/A 07/15/2012   Procedure: PERMANENT PACEMAKER INSERTION;  Surgeon: Deboraha Sprang, MD;  Location: [facility] CATH LAB;  Service: Cardiovascular;  Laterality: N/A;   RIGHT/LEFT HEART CATH AND CORONARY/GRAFT ANGIOGRAPHY N/A 05/22/2022   Procedure: RIGHT/LEFT HEART CATH AND CORONARY/GRAFT ANGIOGRAPHY;  Surgeon: Jettie Booze, MD;  Location: [place] CV LAB;  Service: Cardiovascular;  Laterality: N/A;   rotator cuff surgery Right 2017   SCLEROTHERAPY  06/06/2022   Procedure: SCLEROTHERAPY;  Surgeon: Yetta Flock, MD;  Location: [facility] ENDOSCOPY;  Service: Gastroenterology;;   TONSILLECTOMY  1956   WISDOM TOOTH EXTRACTION      Current Outpatient Medications  Medication Sig Dispense Refill   apixaban (ELIQUIS) 5 MG TABS tablet Take 1 tablet (5 mg total) by mouth 2 (two) times daily. 60 tablet 6   atorvastatin (LIPITOR) 40 MG tablet Take 1 tablet (40 mg total) by mouth daily. 90 tablet 1   Cholecalciferol 25 MCG (1000 UT) capsule Take 1,000 Units by mouth daily with supper.      clopidogrel (PLAVIX) 75 MG tablet Take 1 tablet (75 mg total) by mouth daily. 30 tablet 5   isosorbide mononitrate (IMDUR) 60 MG 24 hr tablet Take 1 tablet (60 mg total) by mouth daily. 90 tablet  3   losartan (COZAAR) 25 MG tablet Take 0.5 tablets (12.5 mg total) by mouth daily. (Patient taking differently: Take 25 mg by mouth daily.) 90 tablet 1   nitroGLYCERIN (NITROSTAT) 0.4 MG SL tablet Place 1 tablet (0.4 mg total) under the tongue every 5 (five) minutes as needed. 25 tablet 2   NONFORMULARY OR COMPOUNDED ITEM Apply 1 application topically daily as needed (rash). Cetaphil + triamcinolone cream     pantoprazole (PROTONIX) 40 MG tablet Take 1 tablet (40 mg total) by mouth 2 (two) times daily. (Patient taking differently: Take 40 mg by mouth daily.) 180 tablet 1   No current facility-administered medications for this visit.    Allergies  Allergen Reactions   Spironolactone     Cramps, dizziness   Cayenne Other  (See Comments)    Sweats w/paprika too   Niacin And Related Other (See Comments)    Upset stomach   Sulfa Antibiotics Itching and Rash    "have no idea; mother told me I was allergic to"    Sulfonamide Derivatives Other (See Comments)    "have no idea; mother told me I was allergic to"    Review of Systems negative except from HPI and PMH  Physical Exam  BP 124/68   Pulse 72   Ht '5\' 7"'$  (1.702 m)   Wt 189 lb 6.4 oz (85.9 kg)   SpO2 97%   BMI 29.66 kg/m  Well developed and well nourished in no acute distress HENT normal Neck supple with JVP-8-10 Clear Device pocket well healed; without hematoma or erythema.  There is no tethering  Regular rate and rhythm controlled ventricular response no what gallop 2/6 murmur Abd-soft with active BS No Clubbing cyanosis 2+ edema Skin-warm and dry A & Oriented  Grossly normal sensory and motor function  ECG ventricular pacing at 72   ECG  Atrial fib @ 61  -/10/43 ECG 5/22  Afib  ECG 9/21 AFib Assessment and  Plam  PACs  Atrial fibrillation s/p PVI [facility] and [facility] 2020 now permanent  Sinus node dysfunction  Ischemic heart disease with prior bypass surgery  Pacemaker  [employer]  Pacemaker function-abnormal resulting in R on T pacing ( multiple reprogramings)  Anemia associated with GI bleeding   HFpEF   His device is reached ERI and has now moved into a default mode of VVI pacing at 72.  Previous discussion with Dr. Elliot Cousin suggested that with his underlying relative bradycardia, there may be a conduction system disease and so in light of the Default pacing here we have elected to proceed with device generator replacement and we will switch from [employer] to [employer] in light of MRI needs.  With his recent issue of GI bleeding, I have raised the issue with Dr. [facility] as to whether we should refer him for watchman.  It would be my recommendation.  In addition, he remains fatigued.  His last hemoglobin had continued to  drift down following his hospitalization in June/July.  We will recheck his CBC and then we will consider whether he would benefit from IV iron as he has not tolerated p.o. iron here to date.  Volume overloaded.  We will put him on a low-dose diuretic furosemide 20 mg daily x5 days and then as needed

## 2022-09-09 ENCOUNTER — Ambulatory Visit (HOSPITAL_COMMUNITY)
Admission: RE | Admit: 2022-09-09 | Discharge: 2022-09-09 | Disposition: A | Discharge status: Home / Self Care | Payer: PPO | Source: Ambulatory Visit | Attending: Internal Medicine | Admitting: Internal Medicine

## 2022-09-09 ENCOUNTER — Other Ambulatory Visit: Payer: Self-pay

## 2022-09-09 ENCOUNTER — Encounter (HOSPITAL_COMMUNITY)
Admission: RE | Disposition: A | Discharge status: Home / Self Care | Payer: Self-pay | Source: Ambulatory Visit | Attending: Internal Medicine

## 2022-09-09 DIAGNOSIS — Z951 Presence of aortocoronary bypass graft: Secondary | ICD-10-CM | POA: Insufficient documentation

## 2022-09-09 DIAGNOSIS — I4821 Permanent atrial fibrillation: Secondary | ICD-10-CM | POA: Diagnosis not present

## 2022-09-09 DIAGNOSIS — I495 Sick sinus syndrome: Secondary | ICD-10-CM | POA: Insufficient documentation

## 2022-09-09 DIAGNOSIS — D649 Anemia, unspecified: Secondary | ICD-10-CM | POA: Insufficient documentation

## 2022-09-09 DIAGNOSIS — I5032 Chronic diastolic (congestive) heart failure: Secondary | ICD-10-CM | POA: Insufficient documentation

## 2022-09-09 DIAGNOSIS — Z45010 Encounter for checking and testing of cardiac pacemaker pulse generator [battery]: Secondary | ICD-10-CM | POA: Diagnosis not present

## 2022-09-09 DIAGNOSIS — Z950 Presence of cardiac pacemaker: Secondary | ICD-10-CM

## 2022-09-09 DIAGNOSIS — I2510 Atherosclerotic heart disease of native coronary artery without angina pectoris: Secondary | ICD-10-CM | POA: Insufficient documentation

## 2022-09-09 HISTORY — PX: PPM GENERATOR CHANGEOUT: EP1233

## 2022-09-09 SURGERY — PPM GENERATOR CHANGEOUT

## 2022-09-09 MED ORDER — MIDAZOLAM HCL 5 MG/5ML IJ SOLN
INTRAMUSCULAR | Status: DC | PRN
  Administered 2022-09-09 (×3): 1 mg via INTRAVENOUS
  Administered 2022-09-09: 2 mg via INTRAVENOUS

## 2022-09-09 MED ORDER — LIDOCAINE HCL (PF) 1 % IJ SOLN
INTRAMUSCULAR | Status: DC | PRN
  Administered 2022-09-09: 50 mL

## 2022-09-09 MED ORDER — CHLORHEXIDINE GLUCONATE 4 % EX LIQD
4.0000 | Freq: Once | CUTANEOUS | Status: DC
  Filled 2022-09-09: qty 60

## 2022-09-09 MED ORDER — SODIUM CHLORIDE 0.9 % IV SOLN: INTRAVENOUS | Status: DC

## 2022-09-09 MED ORDER — FENTANYL CITRATE (PF) 100 MCG/2ML IJ SOLN
INTRAMUSCULAR | Status: AC
  Filled 2022-09-09: qty 2

## 2022-09-09 MED ORDER — SODIUM CHLORIDE 0.9 % IV SOLN
INTRAVENOUS | Status: AC
  Filled 2022-09-09: qty 2

## 2022-09-09 MED ORDER — CEFAZOLIN SODIUM-DEXTROSE 2-4 GM/100ML-% IV SOLN
2.0000 g | INTRAVENOUS | Status: AC
  Administered 2022-09-09: 2 g via INTRAVENOUS

## 2022-09-09 MED ORDER — ACETAMINOPHEN 325 MG PO TABS: 325.0000 mg | ORAL_TABLET | ORAL | Status: DC | PRN

## 2022-09-09 MED ORDER — MIDAZOLAM HCL 5 MG/5ML IJ SOLN
INTRAMUSCULAR | Status: AC
  Filled 2022-09-09: qty 5

## 2022-09-09 MED ORDER — FENTANYL CITRATE (PF) 100 MCG/2ML IJ SOLN
INTRAMUSCULAR | Status: DC | PRN
  Administered 2022-09-09: 50 ug via INTRAVENOUS
  Administered 2022-09-09 (×2): 25 ug via INTRAVENOUS

## 2022-09-09 MED ORDER — SODIUM CHLORIDE 0.9 % IV SOLN
80.0000 mg | INTRAVENOUS | Status: AC
  Administered 2022-09-09: 80 mg

## 2022-09-09 MED ORDER — CEFAZOLIN SODIUM-DEXTROSE 2-4 GM/100ML-% IV SOLN
INTRAVENOUS | Status: AC
  Filled 2022-09-09: qty 100

## 2022-09-09 MED ORDER — LIDOCAINE HCL 1 % IJ SOLN
INTRAMUSCULAR | Status: AC
  Filled 2022-09-09: qty 60

## 2022-09-09 SURGICAL SUPPLY — 6 items
CABLE SURGICAL S-101-97-12 (CABLE) ×2 IMPLANT
HEMOSTAT SURGICEL 2X4 FIBR (HEMOSTASIS) IMPLANT
IPG PACE AZUR XT DR MRI W1DR01 (Pacemaker) IMPLANT
PACE AZURE XT DR MRI W1DR01 (Pacemaker) ×1 IMPLANT
PAD DEFIB RADIO PHYSIO CONN (PAD) ×2 IMPLANT
TRAY PACEMAKER INSERTION (PACKS) ×2 IMPLANT

## 2022-09-09 NOTE — Progress Notes (Signed)
Patient was given discharge instructions. He verbalized understanding. 

## 2022-09-09 NOTE — Interval H&P Note (Signed)
History and Physical Interval Note:  09/09/2022 1:26 PM  Russell Griffith  has presented today for surgery, with the diagnosis of eri.  The various methods of treatment have been discussed with the patient and family. After consideration of risks, benefits and other options for treatment, the patient has consented to  Procedure(s): PPM GENERATOR CHANGEOUT (N/A) as a surgical intervention.  The patient's history has been reviewed, patient examined, no change in status, stable for surgery.  I have reviewed the patient's chart and labs.  Questions were answered to the patient's satisfaction.     Virl Axe

## 2022-09-09 NOTE — Discharge Instructions (Signed)

## 2022-09-10 ENCOUNTER — Telehealth: Payer: Self-pay

## 2022-09-10 ENCOUNTER — Encounter (HOSPITAL_COMMUNITY): Payer: Self-pay | Admitting: Internal Medicine

## 2022-09-10 DIAGNOSIS — K317 Polyp of stomach and duodenum: Secondary | ICD-10-CM

## 2022-09-10 NOTE — Telephone Encounter (Signed)
-----   Message from Yetta Flock, MD sent at 09/09/2022  6:58 PM EDT ----- Regarding: follow up EGD [facility], Dr. Caryl Comes reached out to me about this patient. HE thinks he is okay to hold his Plavix and Eliquis for an EGD in the upcoming months, if you can help schedule this at the [facility] for this patient to remove gastric polyp. Thanks

## 2022-09-10 NOTE — Telephone Encounter (Signed)
Called and spoke with patient. He has been scheduled for an EGD in the [place] with Dr. Havery Moros on Monday, 10/05/22 at 10 am. Pt is aware that he will need to arrive in the [facility] by 9 am with a care partner. Pt is aware that he will need to hold Plavix for 5 days prior to the procedure and Eliquis 2 days prior. Pt is aware that these instructions will be attached. Pt is aware that I will send instructions via MyChart and I will mail a copy as well. Pt confirmed address on file. Pt verbalized understanding of all information and had no concerns at the end of the call.   Ambulatory referral to GI in epic. EGD instructions sent via MyChart and mailed.

## 2022-09-10 NOTE — Telephone Encounter (Signed)
    Received referral message from Dr. Caryl Comes. Scheduled the patient for Watchman consult 11/15 with Dr. Quentin Ore. He was grateful for call and agrees with plan.

## 2022-09-20 ENCOUNTER — Other Ambulatory Visit: Payer: Self-pay | Admitting: Internal Medicine

## 2022-09-21 NOTE — Telephone Encounter (Signed)
Prescription refill request for Eliquis received. Indication:Afib Last office visit:10/23 Scr:1.0 Age: 76 Weight:83.9 kg  Prescription refilled

## 2022-09-23 ENCOUNTER — Ambulatory Visit: Payer: PPO | Attending: Internal Medicine

## 2022-09-23 DIAGNOSIS — I495 Sick sinus syndrome: Secondary | ICD-10-CM | POA: Diagnosis not present

## 2022-09-23 LAB — CUP PACEART INCLINIC DEVICE CHECK
Battery Remaining Longevity: 184 mo
Battery Voltage: 3.23 V
Brady Statistic AP VP Percent: 0 %
Brady Statistic AP VS Percent: 0 %
Brady Statistic AS VP Percent: 0.2 %
Brady Statistic AS VS Percent: 99.8 %
Brady Statistic RA Percent Paced: 0 %
Brady Statistic RV Percent Paced: 0.2 %
Date Time Interrogation Session: 20231025114349
Implantable Lead Connection Status: 753985
Implantable Lead Connection Status: 753985
Implantable Lead Implant Date: 20130816
Implantable Lead Implant Date: 20130816
Implantable Lead Location: 753859
Implantable Lead Location: 753860
Implantable Lead Model: 5076
Implantable Lead Model: 5076
Implantable Pulse Generator Implant Date: 20231011
Lead Channel Impedance Value: 285 Ohm
Lead Channel Impedance Value: 437 Ohm
Lead Channel Impedance Value: 570 Ohm
Lead Channel Impedance Value: 855 Ohm
Lead Channel Pacing Threshold Amplitude: 2.375 V
Lead Channel Pacing Threshold Pulse Width: 0.4 ms
Lead Channel Sensing Intrinsic Amplitude: 0.375 mV
Lead Channel Sensing Intrinsic Amplitude: 14.5 mV
Lead Channel Sensing Intrinsic Amplitude: 16.125 mV
Lead Channel Setting Pacing Amplitude: 2.5 V
Lead Channel Setting Pacing Pulse Width: 1 ms
Lead Channel Setting Sensing Sensitivity: 0.9 mV
Zone Setting Status: 755011

## 2022-09-23 NOTE — Progress Notes (Signed)
Wound check appointment. Dermabondremoved. Wound without redness or edema. Incision edges approximated, wound well healed. Normal device function. Thresholds, sensing, and impedances consistent with implant measurements. RV threshold was 3.0V @ 0.47m, rechecked ~ 2.5V @ 1.0 ms. SK aware and advised to program output 0.5V above threshold @ 1.036m RV output programmed 2.5V 2 1.42m7m Histogram distribution appropriate for patient and level of activity. No high ventricular rates noted. Patient educated about wound care, arm mobility lifting restrictions. ROV in 3 months with implanting physician.

## 2022-09-23 NOTE — Patient Instructions (Signed)
   After Your Pacemaker   Monitor your pacemaker site for redness, swelling, and drainage. Call the device clinic at [phone] if you experience these symptoms or fever/chills.  Your incision was closed with Dermabond:  You may shower 1 day after your defibrillator implant and wash your incision with soap and water. Avoid lotions, ointments, or perfumes over your incision until it is well-healed.  You may use a hot tub or a pool after your wound check appointment if the incision is completely closed.  You may drive, unless driving has been restricted by your healthcare providers.  Your Pacemaker is MRI compatible.  Remote monitoring is used to monitor your pacemaker from home. This monitoring is scheduled every 91 days by our office. It allows [place] to keep an eye on the functioning of your device to ensure it is working properly. You will routinely see your Electrophysiologist annually (more often if necessary).

## 2022-10-05 ENCOUNTER — Encounter: Payer: PPO | Admitting: Gastroenterology

## 2022-10-14 ENCOUNTER — Encounter: Payer: Self-pay | Admitting: Cardiology

## 2022-10-14 ENCOUNTER — Ambulatory Visit: Payer: PPO | Admitting: Internal Medicine

## 2022-10-14 ENCOUNTER — Ambulatory Visit: Payer: PPO | Attending: Cardiology | Admitting: Cardiology

## 2022-10-14 ENCOUNTER — Encounter: Payer: Self-pay | Admitting: *Deleted

## 2022-10-14 VITALS — BP 116/58 | HR 53 | Ht 71.0 in | Wt 185.0 lb

## 2022-10-14 DIAGNOSIS — I482 Chronic atrial fibrillation, unspecified: Secondary | ICD-10-CM

## 2022-10-14 DIAGNOSIS — I25810 Atherosclerosis of coronary artery bypass graft(s) without angina pectoris: Secondary | ICD-10-CM

## 2022-10-14 DIAGNOSIS — Z950 Presence of cardiac pacemaker: Secondary | ICD-10-CM

## 2022-10-14 DIAGNOSIS — I4891 Unspecified atrial fibrillation: Secondary | ICD-10-CM | POA: Diagnosis not present

## 2022-10-14 DIAGNOSIS — Z01818 Encounter for other preprocedural examination: Secondary | ICD-10-CM

## 2022-10-14 NOTE — Patient Instructions (Addendum)
Medication Instructions:  No changes  *If you need a refill on your cardiac medications before your next appointment, please call your pharmacy*   Lab Work: BMP  If you have labs (blood work) drawn today and your tests are completely normal, you will receive your results only by: [place] (if you have MyChart) OR A paper copy in the mail If you have any lab test that is abnormal or we need to change your treatment, we will call you to review the results.   Testing/Procedures: Your physician has requested that you have cardiac CT. Cardiac computed tomography (CT) is a painless test that uses an x-ray machine to take clear, detailed pictures of your heart. For further information please visit [email or web address]. Please follow instruction sheet as given.     Follow-Up: At [facility], you and your health needs are our priority.  As part of our continuing mission to provide you with exceptional heart care, we have created designated Provider Care Teams.  These Care Teams include your primary Cardiologist (physician) and Advanced Practice Providers (APPs -  Physician Assistants and Nurse Practitioners) who all work together to provide you with the care you need, when you need it.  We recommend signing up for the patient portal called "MyChart".  Sign up information is provided on this After Visit Summary.  MyChart is used to connect with patients for Virtual Visits (Telemedicine).  Patients are able to view lab/test results, encounter notes, upcoming appointments, etc.  Non-urgent messages can be sent to your provider as well.   To learn more about what you can do with MyChart, go to [email or web address].    Your next appointment:   Lenice Llamas, the Watchman Nurse Navigator, will call you after your CT once the [facility] Team has reviewed your imaging for an update on proceedings. [place]'s direct number is [phone] if you need assistance.   Important Information About  [place]

## 2022-10-14 NOTE — Progress Notes (Signed)
Electrophysiology Office Note:    Date:  10/14/2022   ID:  Russell Griffith, DOB 1946/06/02, MRN [id number]  PCP:  Cassandria Anger, MD  [facility] Cardiologist:  Minus Breeding, MD  [facility] Electrophysiologist:  Virl Axe, MD   Referring MD: Cassandria Anger, MD   Chief Complaint: Watchman Consult  History of Present Illness:    Russell Griffith is a 76 y.o. male who presents for [facility] consult at the request of Dr. Caryl Comes. Their medical history includes PVCs, persistent afib, and HTN.  Today,  He is seeing multiple doctors currently. He's had recurrent trouble with afib and anemia.  In 2000, he had quintuple bypass surgery. Afterwards, he began to have shortness of breath, so he had stents inserted. While on the many medications for that procedure, he had a serious stomach bleed which placed him in the hospital.   Currently, he bruises and bleeds easily. He sometimes gets bleeding hemorrhoids.   He will go off of Plavix in December.  His incision for the pacemaker has healed well.   He denies any chest pain or peripheral edema. No lightheadedness, headaches, syncope, orthopnea, or PND.    Past Medical History:  Diagnosis Date   Anxiety    Aortic stenosis    Mild, echo, April, 2014   BPH (benign prostatic hyperplasia)    CAD (coronary artery disease)    a. 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 s/p PVI at [facility]   Precancerous lesion    Forehead   Primary osteoarthritis of left knee    Mild   Prolapsed internal hemorrhoids, grade 3 08/12/2015   PVC's (premature ventricular  contractions)    Tubular adenoma of colon     Past Surgical History:  Procedure Laterality Date   ABLATION     PVI at [place] N/A 01/25/2019   Procedure: [place];  Surgeon: [name], MD;  Location: [place] [facility];  Service: Cardiovascular;  Laterality: N/A;   CARDIOVERSION N/A 10/14/2018   Procedure: CARDIOVERSION;  Surgeon: Fay Records, MD;  Location: [place];  Service: Cardiovascular;  Laterality: N/A;   CARDIOVERSION N/A 08/18/2019   Procedure: CARDIOVERSION;  Surgeon: Jerline Pain, MD;  Location: [facility] ENDOSCOPY;  Service: Cardiovascular;  Laterality: N/A;   COLONOSCOPY     CORONARY ARTERY BYPASS GRAFT  2000   CABG X5   CORONARY STENT INTERVENTION N/A 05/22/2022   Procedure: CORONARY STENT INTERVENTION;  Surgeon: Jettie Booze, MD;  Location: [place] [facility];  Service: Cardiovascular;  Laterality: N/A;   CYSTOSCOPY WITH RETROGRADE PYELOGRAM, URETEROSCOPY AND STENT PLACEMENT Bilateral 09/04/2020   Procedure: CYSTOSCOPY WITH BILATERAL RETROGRADE PYELOGRAM, RIGHT URETEROSCOPY AND RIGHT  STENT PLACEMENT;  Surgeon: Franchot Gallo, MD;  Location: [facility] ORS;  Service: Urology;  Laterality: Bilateral;   ENTEROSCOPY N/A 06/06/2022   Procedure: ENTEROSCOPY;  Surgeon: Yetta Flock, MD;  Location: [facility] ENDOSCOPY;  Service: Gastroenterology;  Laterality: N/A;   ESOPHAGOGASTRODUODENOSCOPY     HEAD & NECK  SKIN LESION EXCISIONAL BIOPSY     HEMORRHOID BANDING     HEMOSTASIS CLIP PLACEMENT  06/06/2022   Procedure: HEMOSTASIS CLIP PLACEMENT;  Surgeon: Yetta Flock, MD;  Location: [facility] ENDOSCOPY;  Service: Gastroenterology;;   HOT HEMOSTASIS N/A 06/06/2022   Procedure: HOT HEMOSTASIS (ARGON PLASMA COAGULATION/BICAP);  Surgeon: Yetta Flock, MD;  Location: [facility];  Service: Gastroenterology;  Laterality: N/A;   INTRAVASCULAR PRESSURE WIRE/FFR STUDY N/A 01/05/2018   Procedure: INTRAVASCULAR PRESSURE WIRE/FFR STUDY;   Surgeon: Sherren Mocha, MD;  Location: [place] [facility];  Service: Cardiovascular;  Laterality: N/A;   INTRAVASCULAR ULTRASOUND/IVUS N/A 05/22/2022   Procedure: Intravascular Ultrasound/IVUS;  Surgeon: Jettie Booze, MD;  Location: [place] [facility];  Service: Cardiovascular;  Laterality: N/A;   LEFT HEART CATH AND CORS/GRAFTS ANGIOGRAPHY N/A 01/05/2018   Procedure: LEFT HEART CATH AND CORS/GRAFTS ANGIOGRAPHY;  Surgeon: Sherren Mocha, MD;  Location: [place] [facility];  Service: Cardiovascular;  Laterality: N/A;   PERMANENT PACEMAKER INSERTION N/A 07/15/2012   Procedure: PERMANENT PACEMAKER INSERTION;  Surgeon: Deboraha Sprang, MD;  Location: [facility] CATH LAB;  Service: Cardiovascular;  Laterality: N/A;   PPM GENERATOR CHANGEOUT N/A 09/09/2022   Procedure: PPM GENERATOR CHANGEOUT;  Surgeon: Deboraha Sprang, MD;  Location: [place] CV LAB;  Service: Cardiovascular;  Laterality: N/A;   RIGHT/LEFT HEART CATH AND CORONARY/GRAFT ANGIOGRAPHY N/A 05/22/2022   Procedure: RIGHT/LEFT HEART CATH AND CORONARY/GRAFT ANGIOGRAPHY;  Surgeon: Jettie Booze, MD;  Location: [place] [facility] LAB;  Service: Cardiovascular;  Laterality: N/A;   rotator cuff surgery Right 2017   SCLEROTHERAPY  06/06/2022   Procedure: SCLEROTHERAPY;  Surgeon: Yetta Flock, MD;  Location: [facility] ENDOSCOPY;  Service: Gastroenterology;;   TONSILLECTOMY  1956   WISDOM TOOTH EXTRACTION      Current Medications: Current Meds  Medication Sig   ACCRUFER 30 MG CAPS Take 1 capsule by mouth 2 (two) times daily.   acetaminophen (TYLENOL) 500 MG tablet Take 1,000 mg by mouth every 6 (six) hours as needed for moderate pain, mild pain or headache.   atorvastatin (LIPITOR) 40 MG tablet Take 1 tablet (40 mg total) by mouth daily.   Carboxymethylcellulose Sodium (DRY EYE RELIEF OP) Place 1 drop into both eyes daily as needed (Dry eye).   Cholecalciferol 25 MCG (1000 UT) capsule Take 1,000 Units by mouth daily with supper.    clopidogrel  (PLAVIX) 75 MG tablet Take 1 tablet (75 mg total) by mouth daily.   ELIQUIS 5 MG TABS tablet TAKE ONE TABLET BY MOUTH TWICE DAILY   furosemide (LASIX) 20 MG tablet Take 1 tablet once daily for 5 days, then take daily as needed   isosorbide mononitrate (IMDUR) 60 MG 24 hr tablet Take 1 tablet (60 mg total) by mouth daily.   losartan (COZAAR) 25 MG tablet Take 0.5 tablets (12.5 mg total) by mouth daily.   nitroGLYCERIN (NITROSTAT) 0.4 MG SL tablet Place 1 tablet (0.4 mg total) under the tongue every 5 (five) minutes as needed.   NONFORMULARY OR COMPOUNDED ITEM Apply 1 application topically daily as needed (rash). Cetaphil + triamcinolone cream   pantoprazole (PROTONIX) 40 MG tablet Take 1 tablet (40 mg total) by mouth 2 (two) times daily. (Patient taking differently: Take 40 mg by mouth daily.)     Allergies:   Spironolactone, Cayenne, Niacin and related, Sulfa antibiotics, and Sulfonamide derivatives   Social History   Socioeconomic History   Marital status: Married    Spouse name: Not on file  Number of children: 2   Years of education: 18   Highest education level: Not on file  Occupational History   Occupation: Technical brewer: [employer]    Comment: [employer]  Tobacco Use   Smoking status: Never   Smokeless tobacco: Never  Vaping Use   Vaping Use: Never used  Substance and Sexual Activity   Alcohol use: Yes    Comment: 1-2 glasses wine   Drug use: No   Sexual activity: Yes    Partners: Female  Other Topics Concern   Not on file  Social History Narrative   [employer], [place].  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  2 daughters-'75, '79; 1 grandchild; step-daughter and step grandson.  Regular exercise-yes, runs 1.5-2 mi 4x/wk, also eliptical      Patient signed a Designated Party Release to allow his wife, Russell Griffith, to have access to his medical  records/ information.   Social Determinants of Health   Financial Resource Strain: Low Risk  (01/05/2022)   Overall Financial Resource Strain (CARDIA)    Difficulty of Paying Living Expenses: Not hard at all  Food Insecurity: No Food Insecurity (01/05/2022)   Hunger Vital Sign    Worried About Running Out of Food in the Last Year: Never true    Ran Out of Food in the Last Year: Never true  Transportation Needs: No Transportation Needs (01/05/2022)   PRAPARE - Hydrologist (Medical): No    Lack of Transportation (Non-Medical): No  Physical Activity: Sufficiently Active (01/05/2022)   Exercise Vital Sign    Days of Exercise per Week: 7 days    Minutes of Exercise per Session: 60 min  Stress: No Stress Concern Present (01/05/2022)   [place]    Feeling of Stress : Not at all  Social Connections: Socially Integrated (01/05/2022)   Social Connection and Isolation Panel [NHANES]    Frequency of Communication with Friends and Family: More than three times a week    Frequency of Social Gatherings with Friends and Family: More than three times a week    Attends Religious Services: More than 4 times per year    Active Member of [employer] or Organizations: Yes    Attends Music therapist: More than 4 times per year    Marital Status: Married     Family History: The patient's family history includes Coronary artery disease (age of onset: 28) in an other family member; Diabetes in his father and another family member; Hyperlipidemia in an other family member; Hypertension in an other family member; Multiple myeloma in his mother; Renal Disease in his father. There is no history of Colon cancer, Esophageal cancer, Stomach cancer, or Rectal cancer.  ROS:   Please see the history of present illness.   (+) Afib (+) Anemia (+) Shortness of Breath (+) Excess bleeding/bruising (+) Hemorrhoids    All  other systems reviewed and are negative.  EKGs/Labs/Other Studies Reviewed:    The following studies were reviewed today:  09/23/2022 Device check in clinic Wound check appointment. Dermabondremoved. Wound without redness or edema. Incision edges approximated, wound well healed. Normal device function. Thresholds, sensing, and impedances consistent with implant measurements. RV threshold was 3.0V @ 0.82m,  rechecked ~ 2.5V @ 1.0 ms. SK aware and advised to program output 0.5V above threshold @ 1.029m RV output programmed  2.5V 2 1.67m.  Histogram distribution appropriate for patient and level of activity. No high ventricular rates noted. Patient educated   EKG:   EKG is personally reviewed.  No EKG ordered   Recent Labs: 06/15/2022: ALT 17 09/08/2022: BUN 19; Creatinine, Ser 1.09; Hemoglobin 11.5; Platelets 192; Potassium 4.3; Sodium 141   Recent Lipid Panel    Component Value Date/Time   CHOL 105 04/14/2022 1157   TRIG 42.0 04/14/2022 1157   HDL 31.60 (L) 04/14/2022 1157   CHOLHDL 3 04/14/2022 1157   VLDL 8.4 04/14/2022 1157   LDLCALC 65 04/14/2022 1157    Physical Exam:    VS:  BP (!) 116/58   Pulse (!) 53   Ht _0  (1.803 m)   Wt 185 lb (83.9 kg)   SpO2 97%   BMI 25.80 kg/m     Wt Readings from Last 3 Encounters:  10/14/22 185 lb (83.9 kg)  09/09/22 185 lb (83.9 kg)  09/08/22 189 lb 6.4 oz (85.9 kg)     GEN: Well nourished, well developed in no acute distress.  HEENT: Normal NECK: No JVD; No carotid bruits LYMPHATICS: No lymphadenopathy CARDIAC: irregularly irregular, no murmurs, rubs, gallops. Pacemaker pocket well healed RESPIRATORY:  Clear to auscultation without rales, wheezing or rhonchi  ABDOMEN: Soft, non-tender, non-distended MUSCULOSKELETAL:  No edema; No deformity  SKIN: Warm and dry NEUROLOGIC:  Alert and oriented x 3 PSYCHIATRIC:  Normal affect       ASSESSMENT:    1. Chronic atrial fibrillation ([place]   2. Atrial fibrillation, unspecified  type ([place]   3. Pre-op evaluation   4. PPM-[employer]   5. Coronary artery disease involving coronary bypass graft of native heart without angina pectoris    PLAN:    In order of problems listed above:  #Permanent atrial fibrillation Active, minimally symptomatic from his arrhythmia. Currently on eliquis but has experienced significant anemia and would like to pursue a stroke risk mitigation strategy that avoids long term exposure to anticoagulation given a history of IG bleeding.  -------------------  I have seen Russell Griffith the office today who is being considered for a Watchman left atrial appendage closure device. I believe they will benefit from this procedure given their history of atrial fibrillation, CHA2DS2-VASc score of 3 and unadjusted ischemic stroke rate of 3.2% per year. Unfortunately, the patient is not felt to be a long term anticoagulation candidate secondary to GI bleeding history. The patient's chart has been reviewed and I feel that they would be a candidate for short term oral anticoagulation after Watchman implant.   It is my belief that after undergoing a LAA closure procedure, Russell Winskiwill not need long term anticoagulation which eliminates anticoagulation side effects and major bleeding risk.   Procedural risks for the Watchman implant have been reviewed with the patient including a 0.5% risk of stroke, <1% risk of perforation and <1% risk of device embolization. Other risks include bleeding, vascular damage, tamponade, worsening renal function, and death. The patient understands these risk and wishes to proceed.     The published clinical data on the safety and effectiveness of WATCHMAN include but are not limited to the following: - Holmes DR, RMechele Claude Sick P et al. for the PROTECT AF Investigators. Percutaneous closure of the left atrial appendage versus warfarin therapy for prevention of stroke in patients with atrial fibrillation: a  randomised non-inferiority trial. Lancet 2009; 374: 534-42. -Mechele Claude Doshi SK, SAbelardo DieselD et  al. on behalf of the PROTECT AF Investigators. Percutaneous Left Atrial Appendage Closure for Stroke Prophylaxis in Patients With Atrial Fibrillation 2.3-Year Follow-up of the PROTECT AF (Watchman Left Atrial Appendage System for Embolic Protection in Patients With Atrial Fibrillation) Trial. Circulation 2013; 127:720-729. - Alli O, Doshi S,  Kar S, Reddy VY, Sievert H et al. Quality of Life Assessment in the Randomized PROTECT AF (Percutaneous Closure of the Left Atrial Appendage Versus Warfarin Therapy for Prevention of Stroke in Patients With Atrial Fibrillation) Trial of Patients at Risk for Stroke With Nonvalvular Atrial Fibrillation. J Am Coll Cardiol 2013; 16:1096-0. Vertell Limber DR, Tarri Abernethy, Price M, [place], Sievert H, Doshi S, Huber K, Reddy V. Prospective randomized evaluation of the Watchman left atrial appendage Device in patients with atrial fibrillation versus long-term warfarin therapy; the PREVAIL trial. Journal of the [employer] of Cardiology, Vol. 4, No. 1, 2014, 1-11. - Kar S, Doshi SK, Sadhu A, Horton R, Osorio J et al. Primary outcome evaluation of a next-generation left atrial appendage closure device: results from the PINNACLE FLX trial. Circulation 2021;143(18)1754-1762.    After today's visit with the patient which was dedicated solely for shared decision making visit regarding LAA closure device, the patient decided to proceed with the LAA appendage closure procedure scheduled to be done in the near future at [facility]. Prior to the procedure, I would like to obtain a gated CT scan of the chest with contrast timed for PV/LA visualization.    HAS-BLED score 4 Hypertension Yes  Abnormal renal and liver function (Dialysis, transplant, Cr >2.26 mg/dL /Cirrhosis or Bilirubin >2x Normal or AST/ALT/AP >3x Normal) No  Stroke No  Bleeding Yes  Labile INR  (Unstable/high INR) No  Elderly (>65) Yes  Drugs or alcohol (? 8 drinks/week, anti-plt or NSAID) Yes   CHA2DS2-VASc Score = 3  The patient's score is based upon: CHF History: 0 HTN History: 1 Diabetes History: 0 Stroke History: 0 Vascular Disease History: 1 Age Score: 1 Gender Score: 0    Schedule watchman  Total time spent with patient today 50 minutes. This includes reviewing records, evaluating the patient and coordinating care.  Medication Adjustments/Labs and Tests Ordered: Current medicines are reviewed at length with the patient today.  Concerns regarding medicines are outlined above.   Orders Placed This Encounter  Procedures   CT CARDIAC MORPH/PULM VEIN W/CM&W/O CA SCORE   Basic Metabolic Panel (BMET)   No orders of the defined types were placed in this encounter.   I,Mary Mosetta Pigeon Buren,acting as a scribe for Vickie Epley, MD.,have documented all relevant documentation on the behalf of Vickie Epley, MD,as directed by  Vickie Epley, MD while in the presence of Vickie Epley, MD.  I, Vickie Epley, MD, have reviewed all documentation for this visit. The documentation on 10/14/22 for the exam, diagnosis, procedures, and orders are all accurate and complete.   Signed, Hilton Cork. Quentin Ore, MD, [facility], [facility] 10/14/2022 7:22 PM    Electrophysiology [facility]

## 2022-10-15 LAB — BASIC METABOLIC PANEL
BUN/Creatinine Ratio: 23 (ref 10–24)
BUN: 21 mg/dL (ref 8–27)
CO2: 24 mmol/L (ref 20–29)
Calcium: 9.4 mg/dL (ref 8.6–10.2)
Chloride: 103 mmol/L (ref 96–106)
Creatinine, Ser: 0.92 mg/dL (ref 0.76–1.27)
Glucose: 93 mg/dL (ref 70–99)
Potassium: 4 mmol/L (ref 3.5–5.2)
Sodium: 140 mmol/L (ref 134–144)
eGFR: 87 mL/min/{1.73_m2} (ref >59)

## 2022-10-19 ENCOUNTER — Telehealth: Payer: Self-pay | Admitting: *Deleted

## 2022-10-19 ENCOUNTER — Ambulatory Visit: Payer: PPO | Admitting: Cardiology

## 2022-10-19 NOTE — Progress Notes (Unsigned)
Cardiology Office Note   Date:  10/20/2022   ID:  Russell Griffith, DOB Nov 02, 1946, MRN [id number]  PCP:  Cassandria Anger, MD  Cardiologist:   Minus Breeding, MD    Chief Complaint  Patient presents with   Coronary Artery Disease     History of Present Illness: Russell Griffith is a 76 y.o. male who presents for evaluation of known CAD.    He has a history of coronary disease with his last catheterization demonstrated disease as below.  He had stenting of his native circ and an SVG to the circ.  He has atrial fib He has had 2 ablations 1 at [facility] and most recently by Dr. Rayann Heman but has persistent atrial fibrillation.  He has been previously on Tikosyn.  He did not tolerate Multaq.  He has had persistent atrial fibrillation.  In July he had anemia with hemoglobin of 6.6 and positive Hemoccult.  His dual antiplatelet therapy and apixaban were held.  GI was consulted.  He received 2 units of PRBCs and underwent urgent endoscopy on 06/06/2022.  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  He received fulguration via argon plasma as well as hemostasis clips which stopped bleeding.  His hemoglobin dropped back down to 7.8 from the mid 8 range the previous day after restarting Plavix.  He received another unit of PRBCs.  He saw GI on the 16th .    He is now being scheduled for a Watchman.  Since he was last seen he has done well.  His hemoglobin is risen.  He is exercising.  He denies any new cardiovascular symptoms.  He is not having any palpitations, presyncope or syncope.  He is not having any chest pressure, neck or arm discomfort.  The shortness of breath that was his predominant symptom seems to have resolved with stenting of his vein graft.   Past Medical History:  Diagnosis Date   Anxiety    Aortic stenosis    Mild, echo, April, 2014   BPH (benign prostatic hyperplasia)    CAD (coronary artery disease)    a. 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 s/p PVI at [facility]   Precancerous lesion    Forehead   Primary osteoarthritis of left knee    Mild   Prolapsed internal hemorrhoids, grade 3 08/12/2015   PVC's (premature ventricular contractions)    Tubular adenoma of colon     Past Surgical History:  Procedure Laterality Date   ABLATION     PVI at [place] N/A 01/25/2019   Procedure: [place];  Surgeon: [name], MD;  Location: [place] [facility];  Service: Cardiovascular;  Laterality: N/A;   CARDIOVERSION N/A 10/14/2018   Procedure: CARDIOVERSION;  Surgeon: Fay Records, MD;  Location: [place];  Service: Cardiovascular;  Laterality: N/A;   CARDIOVERSION N/A 08/18/2019   Procedure: CARDIOVERSION;  Surgeon: Jerline Pain, MD;  [facility];  Service: Cardiovascular;  Laterality: N/A;   COLONOSCOPY     CORONARY ARTERY BYPASS GRAFT  2000   CABG X5   CORONARY STENT INTERVENTION N/A 05/22/2022   Procedure: CORONARY STENT INTERVENTION;  Surgeon: Jettie Booze, MD;  Location: [place] [facility];  Service: Cardiovascular;  Laterality: N/A;   CYSTOSCOPY WITH RETROGRADE PYELOGRAM, URETEROSCOPY AND STENT PLACEMENT Bilateral 09/04/2020   Procedure: CYSTOSCOPY WITH BILATERAL RETROGRADE PYELOGRAM, RIGHT URETEROSCOPY AND RIGHT  STENT PLACEMENT;  Surgeon: Franchot Gallo, MD;  Location: [facility];  Service: Urology;  Laterality: Bilateral;   ENTEROSCOPY N/A 06/06/2022   Procedure: ENTEROSCOPY;  Surgeon: Yetta Flock, MD;  Location: [facility];  Service: Gastroenterology;  Laterality:  N/A;   ESOPHAGOGASTRODUODENOSCOPY     HEAD & NECK SKIN LESION EXCISIONAL BIOPSY     HEMORRHOID BANDING     HEMOSTASIS CLIP PLACEMENT  06/06/2022   Procedure: HEMOSTASIS CLIP PLACEMENT;  Surgeon: Yetta Flock, MD;  [facility]: [facility];  Service: Gastroenterology;;   HOT HEMOSTASIS N/A 06/06/2022   Procedure: HOT HEMOSTASIS (ARGON PLASMA COAGULATION/BICAP);  Surgeon: Yetta Flock, MD;  Location: [facility];  Service: Gastroenterology;  Laterality: N/A;   INTRAVASCULAR PRESSURE WIRE/FFR STUDY N/A 01/05/2018   Procedure: INTRAVASCULAR PRESSURE WIRE/FFR STUDY;  Surgeon: Sherren Mocha, MD;  Location: [place] [facility];  Service: Cardiovascular;  Laterality: N/A;   INTRAVASCULAR ULTRASOUND/IVUS N/A 05/22/2022   Procedure: Intravascular Ultrasound/IVUS;  Surgeon: Jettie Booze, MD;  Location: [place] [facility];  Service: Cardiovascular;  Laterality: N/A;   LEFT HEART CATH AND CORS/GRAFTS ANGIOGRAPHY N/A 01/05/2018   Procedure: LEFT HEART CATH AND CORS/GRAFTS ANGIOGRAPHY;  Surgeon: Sherren Mocha, MD;  Location: [place] [facility];  Service: Cardiovascular;  Laterality: N/A;   PERMANENT PACEMAKER INSERTION N/A 07/15/2012   Procedure: PERMANENT PACEMAKER INSERTION;  Surgeon: Deboraha Sprang, MD;  Location: [facility];  Service: Cardiovascular;  Laterality: N/A;   PPM GENERATOR CHANGEOUT N/A 09/09/2022   Procedure: PPM GENERATOR CHANGEOUT;  Surgeon: Deboraha Sprang, MD;  Location: [place] [facility];  Service: Cardiovascular;  Laterality: N/A;   RIGHT/LEFT HEART CATH AND CORONARY/GRAFT ANGIOGRAPHY N/A 05/22/2022   Procedure: RIGHT/LEFT HEART CATH AND CORONARY/GRAFT ANGIOGRAPHY;  Surgeon: Jettie Booze, MD;  Location: [place] [facility];  Service: Cardiovascular;  Laterality: N/A;   rotator cuff surgery Right 2017   SCLEROTHERAPY  06/06/2022   Procedure: SCLEROTHERAPY;  Surgeon: Yetta Flock, MD;  Location: [facility];  Service: Gastroenterology;;   TONSILLECTOMY  1956    WISDOM TOOTH EXTRACTION       Current Outpatient Medications  Medication Sig Dispense Refill   ACCRUFER 30 MG CAPS Take 1 capsule by mouth 2 (two) times daily.     acetaminophen (TYLENOL) 500 MG tablet Take 1,000 mg by mouth every 6 (six) hours as needed for moderate pain, mild pain or headache.     atorvastatin (LIPITOR) 40 MG tablet Take 1 tablet (40 mg total) by mouth daily. 90 tablet 1   Carboxymethylcellulose Sodium (DRY EYE RELIEF OP) Place 1 drop into both eyes daily as needed (Dry eye).     Cholecalciferol 25 MCG (1000 UT) capsule Take 1,000 Units by mouth daily with supper.      clopidogrel (PLAVIX) 75 MG tablet Take 1 tablet (75 mg total) by mouth daily. 30 tablet 5   ELIQUIS 5 MG TABS tablet TAKE ONE TABLET BY MOUTH TWICE DAILY 60 tablet 11   furosemide (LASIX) 20 MG tablet Take 1 tablet  once daily for 5 days, then take daily as needed 30 tablet 1   isosorbide mononitrate (IMDUR) 60 MG 24 hr tablet Take 1 tablet (60 mg total) by mouth daily. 90 tablet 3   losartan (COZAAR) 25 MG tablet Take 0.5 tablets (12.5 mg total) by mouth daily. 90 tablet 1   nitroGLYCERIN (NITROSTAT) 0.4 MG SL tablet Place 1 tablet (0.4 mg total) under the tongue every 5 (five) minutes as needed. 25 tablet 2   NONFORMULARY OR COMPOUNDED ITEM Apply 1 application topically daily as needed (rash). Cetaphil + triamcinolone cream     pantoprazole (PROTONIX) 40 MG tablet Take 1 tablet (40 mg total) by mouth 2 (two) times daily. (Patient taking differently: Take 40 mg by mouth daily.) 180 tablet 1   No current facility-administered medications for this visit.    Allergies:   Spironolactone, Cay[place], Niacin and related, Sulfa antibiotics, and Sulfonamide derivatives    ROS:  Please see the history of present illness.   Otherwise, review of systems are positive for none.   All other systems are reviewed and negative.    PHYSICAL EXAM: VS:  BP 126/62 (BP Location: Left Arm, Patient Position: Sitting, Cuff  Size: Normal)   Pulse (!) 57   Ht '5\' 11"'$  (1.803 m)   Wt 183 lb 12.8 oz (83.4 kg)   SpO2 98%   BMI 25.63 kg/m  , BMI Body mass index is 25.63 kg/m. GENERAL:  Well appearing NECK:  No jugular venous distention, waveform within normal limits, carotid upstroke brisk and symmetric, no bruits, no thyromegaly LUNGS:  Clear to auscultation bilaterally CHEST:  Well healed sternotomy scar.   Well-healed pacemaker pocket HEART:  PMI not displaced or sustained,S1 and S2 within normal limits, no S3, no S4, no clicks, no rubs, 2 out of 6 apical systolic murmur radiating slightly at the catheter tract and early peaking, no diastolic murmurs ABD:  Flat, positive bowel sounds normal in frequency in pitch, no bruits, no rebound, no guarding, no midline pulsatile mass, no hepatomegaly, no splenomegaly EXT:  2 plus pulses throughout, no edema, no cyanosis no clubbing  EKG:  EKG is not ordered today.    CATH 05/22/22  Diagnostic Dominance: Left  Intervention    Recent Labs: 06/15/2022: ALT 17 09/08/2022: Hemoglobin 11.5; Platelets 192 10/14/2022: BUN 21; Creatinine, Ser 0.92; Potassium 4.0; Sodium 140    Lipid Panel    Component Value Date/Time   CHOL 105 04/14/2022 1157   TRIG 42.0 04/14/2022 1157   HDL 31.60 (L) 04/14/2022 1157   CHOLHDL 3 04/14/2022 1157   VLDL 8.4 04/14/2022 1157   LDLCALC 65 04/14/2022 1157      Wt Readings from Last 3 Encounters:  10/20/22 183 lb 12.8 oz (83.4 kg)  10/14/22 185 lb (83.9 kg)  09/09/22 185 lb (83.9 kg)    Other studies Reviewed: Additional studies/ records that were reviewed today include: Labs Review of the above records demonstrates: See above   ASSESSMENT AND PLAN:  ATRIAL FIB:   CHADS VASC score is 3.  He is going to have a Watchman.  We are discussing the timing of this and how to handle his anticoagulation around this as he is also having a gastric polyp resected on the fourth of next month.  PACEMAKER PLACEMENT:   He is up-to-date  with follow-up.  He is status post generator placement last month.    CAD:   The patient has no new sypmtoms.  No further cardiovascular testing is indicated.  We will continue with aggressive risk reduction and meds as listed.  DYSPNEA:  He has no SOB.  No further work up.   AS:  This was mild to moderate in August 2023.    DYSLIPIDEMIA: LDL was 65 with an HDL of 32.  No change in therapy.  PREOP: Patient is acceptable risk for the planned GI procedure.  I would like to limit the amount of time he is off of both of his anticoagulants to 2 days off the Eliquis and hopefully no more than 3 days off of Plavix if acceptable to GI.  Resume as directed by gastroenterology.   Current medicines are reviewed at length with the patient today.  The patient does not have concerns regarding medicines.  The following changes have been made: As above  Labs/ tests ordered today include:  None  No orders of the defined types were placed in this encounter.    Disposition:   FU me in 6 months  Signed, Minus Breeding, MD  10/20/2022 2:08 PM    [facility]

## 2022-10-19 NOTE — Telephone Encounter (Signed)
Just getting ahead, patient has AS, please review when able. THanks

## 2022-10-20 ENCOUNTER — Encounter: Payer: Self-pay | Admitting: Cardiology

## 2022-10-20 ENCOUNTER — Ambulatory Visit: Payer: PPO | Attending: Cardiology | Admitting: Cardiology

## 2022-10-20 VITALS — BP 126/62 | HR 57 | Ht 71.0 in | Wt 183.8 lb

## 2022-10-20 DIAGNOSIS — I25810 Atherosclerosis of coronary artery bypass graft(s) without angina pectoris: Secondary | ICD-10-CM

## 2022-10-20 DIAGNOSIS — E785 Hyperlipidemia, unspecified: Secondary | ICD-10-CM

## 2022-10-20 DIAGNOSIS — I350 Nonrheumatic aortic (valve) stenosis: Secondary | ICD-10-CM | POA: Diagnosis not present

## 2022-10-20 DIAGNOSIS — I482 Chronic atrial fibrillation, unspecified: Secondary | ICD-10-CM | POA: Diagnosis not present

## 2022-10-20 DIAGNOSIS — R0602 Shortness of breath: Secondary | ICD-10-CM | POA: Diagnosis not present

## 2022-10-20 NOTE — Patient Instructions (Signed)
Medication Instructions:  Your physician recommends that you continue on your current medications as directed. Please refer to the Current Medication list given to you today.  *If you need a refill on your cardiac medications before your next appointment, please call your pharmacy*  Follow-Up: At [facility], you and your health needs are our priority.  As part of our continuing mission to provide you with exceptional heart care, we have created designated Provider Care Teams.  These Care Teams include your primary Cardiologist (physician) and Advanced Practice Providers (APPs -  Physician Assistants and Nurse Practitioners) who all work together to provide you with the care you need, when you need it.  We recommend signing up for the patient portal called "MyChart".  Sign up information is provided on this After Visit Summary.  MyChart is used to connect with patients for Virtual Visits (Telemedicine).  Patients are able to view lab/test results, encounter notes, upcoming appointments, etc.  Non-urgent messages can be sent to your provider as well.   To learn more about what you can do with MyChart, go to [email or web address].    Your next appointment:   6 month(s)  The format for your next appointment:   In Person  Provider:   James Hochrein, MD         

## 2022-10-21 NOTE — Telephone Encounter (Signed)
PREOP: Patient is acceptable risk for the planned GI procedure.  I would like to limit the amount of time he is off of both of his anticoagulants to 2 days off the Eliquis and hopefully no more than 3 days off of Plavix if acceptable to GI.  Resume as directed by gastroenterology.   Dr. Havery Moros, This patient is coming in for an EGD on 11-02-22; rescheduled from 10-27-22. He saw his cardiologist on 10-20-22 and the above is his cardiologist's recommendations for his Eliquis and Plavix.  How would you like to proceed?  Thanks, [employer]

## 2022-10-26 ENCOUNTER — Telehealth: Payer: Self-pay | Admitting: *Deleted

## 2022-10-26 ENCOUNTER — Encounter: Payer: Self-pay | Admitting: *Deleted

## 2022-10-26 NOTE — Telephone Encounter (Signed)
Russell Griffith can you let the patient know I spoke with Dr. Percival Spanish, he is okay with having the patient hold Plavix for 5 days prior to polypectomy and Eliquis for 2 days.  Can you make sure he holds his Plavix for this duration prior to his procedure on 12/4.  Assuming procedure goes well without complications of bleeding, we will plan on resuming Plavix shortly after the exam.  Thanks.

## 2022-10-26 NOTE — Telephone Encounter (Signed)
-----   Message from Yetta Flock, MD sent at 10/26/2022 12:18 PM EST ----- Thanks so much for the follow-up, I appreciate it.  This will allow [place] to proceed with removing this polyp from his stomach.  I will place some clips to the site once it is removed to hopefully reduce his risk for post polypectomy bleeding, hopefully we can resume Plavix soon after the procedure.  [name]   ----- Message ----- From: Minus Breeding, MD Sent: 10/26/2022   8:55 AM EST To: Cristopher Estimable, RN; Yetta Flock, MD; #  [name] please send this info to [name] so we are all on the same page.  GI wants him to hold his Eliquis at least 2 days and Plavix for 5 days.  I am OK with that.  After the GI procedure he can needs to be back on the Plavix when OK with Dr. Luvenia Starch.  He needs to on the Eliquis at least one week prior to the [place].  Thanks.  [name]    ----- Message ----- From: Vickie Epley, MD Sent: 10/21/2022   2:25 PM EST To: Minus Breeding, MD; Theodoro Parma, RN  We could do it with as little as 1 week before of [facility].  ----- Message ----- From: Minus Breeding, MD Sent: 10/20/2022   1:38 PM EST To: Vickie Epley, MD  Since you do a TEE with Watchman how long do you need him to be on Eliquis before the procedure.  He is going to be off blood thinners for a polyp removal Dec 4th.  Thanks.

## 2022-10-26 NOTE — Telephone Encounter (Signed)
Message sent to patient via mychart

## 2022-10-27 ENCOUNTER — Encounter: Payer: PPO | Admitting: Gastroenterology

## 2022-10-27 ENCOUNTER — Ambulatory Visit (AMBULATORY_SURGERY_CENTER): Payer: PPO

## 2022-10-27 VITALS — Ht 71.0 in | Wt 182.0 lb

## 2022-10-27 DIAGNOSIS — K922 Gastrointestinal hemorrhage, unspecified: Secondary | ICD-10-CM

## 2022-10-27 DIAGNOSIS — R7989 Other specified abnormal findings of blood chemistry: Secondary | ICD-10-CM

## 2022-10-27 DIAGNOSIS — Z8719 Personal history of other diseases of the digestive system: Secondary | ICD-10-CM

## 2022-10-27 DIAGNOSIS — K317 Polyp of stomach and duodenum: Secondary | ICD-10-CM

## 2022-10-27 NOTE — Progress Notes (Signed)
Pre visit completed via phone call; Patient verified name, DOB, PCP, and address;  No egg or soy allergy known to patient;  No issues known to pt with past sedation with any surgeries or procedures; Patient denies ever being told they had issues or difficulty with intubation;  No FH of Malignant Hyperthermia; Pt is not on diet pills; Pt is not on home 02;  Pt denies issues with constipation;   Pt IS on blood thinners; AFIB dx; Have any cardiac testing pending--NO;  Instructions sent to patient via MyChart per patient request; PLAVIX  is a 5 day HOLD; ELIQUIS is a 2 day HOLD- noted on patient instructions;    Insurance verified during [place] appt=HTA  Previsit completed and red dot placed by patient's name on their procedure day (on provider's schedule).

## 2022-10-29 ENCOUNTER — Encounter: Payer: Self-pay | Admitting: Gastroenterology

## 2022-11-02 ENCOUNTER — Encounter: Payer: Self-pay | Admitting: Gastroenterology

## 2022-11-02 ENCOUNTER — Ambulatory Visit (AMBULATORY_SURGERY_CENTER): Payer: PPO | Admitting: Gastroenterology

## 2022-11-02 VITALS — BP 131/69 | HR 61 | Temp 98.7°F | Resp 18 | Ht 71.0 in | Wt 182.0 lb

## 2022-11-02 DIAGNOSIS — F419 Anxiety disorder, unspecified: Secondary | ICD-10-CM | POA: Diagnosis not present

## 2022-11-02 DIAGNOSIS — I2510 Atherosclerotic heart disease of native coronary artery without angina pectoris: Secondary | ICD-10-CM | POA: Diagnosis not present

## 2022-11-02 DIAGNOSIS — K317 Polyp of stomach and duodenum: Secondary | ICD-10-CM | POA: Diagnosis not present

## 2022-11-02 DIAGNOSIS — I10 Essential (primary) hypertension: Secondary | ICD-10-CM | POA: Diagnosis not present

## 2022-11-02 MED ORDER — SODIUM CHLORIDE 0.9 % IV SOLN: 500.0000 mL | Freq: Once | INTRAVENOUS | Status: DC

## 2022-11-02 NOTE — Progress Notes (Signed)
Called to room to assist during endoscopic procedure.  Patient ID and intended procedure confirmed with present staff. Received instructions for my participation in the procedure from the performing physician.  

## 2022-11-02 NOTE — Op Note (Signed)
[place] Patient Name: Russell Griffith Procedure Date: 11/02/2022 3:17 PM MRN: [id number] Endoscopist: Remo Lipps P. 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                            Incidentally noted to have multiple gastric polyps,                            one of which appeared potentially adenomatous, here                            for follow up EGD - off Eliquis 2 days and Plavix 5                            days Medicines:                Monitored Anesthesia Care Procedure:                Pre-Anesthesia Assessment:                           - Prior to the procedure, a History and Physical                            was performed, and patient medications and                            allergies were reviewed. The patient's tolerance of                            previous anesthesia was also reviewed. The risks                            and benefits of the procedure and the sedation                            options and risks were discussed with the patient.                            All questions were answered, and informed consent                            was obtained. Prior Anticoagulants: The patient has  taken Eliquis (apixaban), last dose was 2 days                            prior to procedure, while last dose of Plavix was 5                            days ago. ASA Grade Assessment: III - A patient                            with severe systemic disease. After reviewing the                            risks and benefits, the patient was deemed in                            satisfactory condition to  undergo the procedure.                           After obtaining informed consent, the endoscope was                            passed under direct vision. Throughout the                            procedure, the patient's blood pressure, pulse, and                            oxygen saturations were monitored continuously. The                            GIF [id number] #[id number] was introduced through the                            mouth, and advanced to the second part of duodenum.                            The upper GI endoscopy was accomplished without                            difficulty. The patient tolerated the procedure                            well. Scope In: Scope Out: Findings:                 The Z-line was irregular but did not meet criteria                            for Barrett's.                           A 1 cm hiatal hernia was present.  The exam of the esophagus was otherwise normal.                           Two 6 to 7 mm sessile polyps were found in the                            gastric body, both inflamed in appearance. Thes                            polyp were removed with a hot snare. Resection and                            retrieval were complete. Both had oozing post                            polypectomy treated with snare tip coagulation. To                            prevent bleeding after the polypectomy, 2                            hemostatic clips were successfully placed across                            each polypectomy site, given need to resume                            anticoagulation. There was no bleeding at the end                            of the procedure.                           There were multiple other benign appearing fundic                            gland polyps throughout the body of the stomach.                            The exam of the stomach was otherwise normal.                           The examined  duodenum was normal. Complications:            No immediate complications. Estimated blood loss:                            Minimal. Estimated Blood Loss:     Estimated blood loss was minimal. Impression:               - Z-line irregular, did not meet criteria for                            Barrett's.                           -  1 cm hiatal hernia.                           - Normal esophagus otherwise                           - Two inflamed gastric polyps. Resected and                            retrieved as outlined. Clips were placed                            prophylactically. Rule out adenomas                           - Multiple benign fundic gland polyps                           - Normal stomach otherwise                           - Normal examined duodenum. Recommendation:           - Patient has a contact number available for                            emergencies. The signs and symptoms of potential                            delayed complications were discussed with the                            patient. Return to normal activities tomorrow.                            Written discharge instructions were provided to the                            patient.                           - Resume previous diet.                           - Continue present medications.                           - Resume Plavix tomorrow (Tuesday), resume Eliquis                            on Wed                           - Await pathology results.                           - Contact me with any bleeding symptoms Remo Lipps P. Otho Michalik, MD 11/02/2022 4:02:59 PM This report has been  signed electronically.

## 2022-11-02 NOTE — Progress Notes (Signed)
[facility] History and Physical   Primary Care Physician:  [name], Evie Lacks, MD   Reason for Procedure:   Gastric polyp removal  Plan:    EGD with removal of gastric polyp     HPI: Russell Griffith is a 76 y.o. male  here for EGD to reassess gastric polyps, removal if amenable. Had a GI bleed in July due to diuelafoy in the setting of anticoagulation. Incidentally noted to have multiple gastric polyps, one of which appeared possibly adenomatous. He is on PLavix and Eliquis at baseline - cardiology cleared him to be off Plavix for 5 days and Eliquis for 2 days for this exam.   Otherwise feels well without any cardiopulmonary symptoms.   I have discussed risks / benefits of anesthesia and endoscopic procedure with Russell Griffith and they wish to proceed with the exams as outlined today.    Past Medical History:  Diagnosis Date   Anemia    on Accrufer   Anxiety    Aortic stenosis    Mild, echo, April, 2014   Blood transfusion without reported diagnosis 2023   BPH (benign prostatic hyperplasia)    CAD (coronary artery disease)    a. 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 s/p PVI at [facility]   Precancerous lesion    Forehead   Primary osteoarthritis of left knee    Mild-bilateral LE   Prolapsed internal hemorrhoids, grade 3 08/12/2015   PVC's (premature ventricular contractions)    Seasonal allergies    Tubular adenoma of colon     Past Surgical History:  Procedure Laterality Date   ABLATION     PVI at [place] N/A 01/25/2019   Procedure: [place];  Surgeon: [name], MD;  Location: [place] [facility];  Service: Cardiovascular;  Laterality: N/A;   CARDIOVERSION N/A 10/14/2018   Procedure: CARDIOVERSION;  Surgeon: Fay Records, MD;  Location: [place];  Service: Cardiovascular;  Laterality: N/A;   CARDIOVERSION N/A 08/18/2019   Procedure: CARDIOVERSION;  Surgeon: Jerline Pain, MD;  Location: [facility] ENDOSCOPY;  Service: Cardiovascular;  Laterality: N/A;   COLONOSCOPY     CORONARY ARTERY BYPASS GRAFT  2000   CABG X5   CORONARY STENT INTERVENTION N/A 05/22/2022   Procedure: CORONARY STENT INTERVENTION;  Surgeon: Jettie Booze, MD;  Location: [place] [facility];  Service: Cardiovascular;  Laterality: N/A;   CYSTOSCOPY WITH RETROGRADE PYELOGRAM, URETEROSCOPY AND STENT PLACEMENT Bilateral 09/04/2020   Procedure: CYSTOSCOPY WITH BILATERAL RETROGRADE PYELOGRAM, RIGHT URETEROSCOPY AND RIGHT  STENT PLACEMENT;  Surgeon: Franchot Gallo, MD;  Location: [facility] OR[facility];  Service: Urology;  Laterality: Bilateral;   ENTEROSCOPY N/A 06/06/2022   Procedure: ENTEROSCOPY;  Surgeon: Yetta Flock, MD;  Location: [facility] ENDOSCOPY;  Service: Gastroenterology;  Laterality: N/A;   ESOPHAGOGASTRODUODENOSCOPY     HEAD & NECK SKIN LESION EXCISIONAL BIOPSY     HEMORRHOID BANDING  HEMOSTASIS CLIP PLACEMENT  06/06/2022   Procedure: HEMOSTASIS CLIP PLACEMENT;  Surgeon: Yetta Flock, MD;  Location: [facility] ENDOSCOPY;  Service: Gastroenterology;;   HOT HEMOSTASIS N/A 06/06/2022   Procedure: HOT HEMOSTASIS (ARGON PLASMA COAGULATION/BICAP);  Surgeon: Yetta Flock, MD;  Location: [facility];  Service: Gastroenterology;  Laterality: N/A;   INTRAVASCULAR PRESSURE WIRE/FFR STUDY N/A 01/05/2018   Procedure: INTRAVASCULAR PRESSURE WIRE/FFR STUDY;  Surgeon: Sherren Mocha, MD;  Location: [place] [facility];  Service: Cardiovascular;  Laterality: N/A;   INTRAVASCULAR ULTRASOUND/IVUS N/A 05/22/2022    Procedure: Intravascular Ultrasound/IVUS;  Surgeon: Jettie Booze, MD;  Location: [place] [facility];  Service: Cardiovascular;  Laterality: N/A;   LEFT HEART CATH AND CORS/GRAFTS ANGIOGRAPHY N/A 01/05/2018   Procedure: LEFT HEART CATH AND CORS/GRAFTS ANGIOGRAPHY;  Surgeon: Sherren Mocha, MD;  Location: [place] [facility];  Service: Cardiovascular;  Laterality: N/A;   PERMANENT PACEMAKER INSERTION N/A 07/15/2012   Procedure: PERMANENT PACEMAKER INSERTION;  Surgeon: Deboraha Sprang, MD;  Location: [facility] CATH LAB;  Service: Cardiovascular;  Laterality: N/A;   PPM GENERATOR CHANGEOUT N/A 09/09/2022   Procedure: PPM GENERATOR CHANGEOUT;  Surgeon: Deboraha Sprang, MD;  Location: [place] [facility];  Service: Cardiovascular;  Laterality: N/A;   RIGHT/LEFT HEART CATH AND CORONARY/GRAFT ANGIOGRAPHY N/A 05/22/2022   Procedure: RIGHT/LEFT HEART CATH AND CORONARY/GRAFT ANGIOGRAPHY;  Surgeon: Jettie Booze, MD;  Location: [place] [facility];  Service: Cardiovascular;  Laterality: N/A;   rotator cuff surgery Right 2017   SCLEROTHERAPY  06/06/2022   Procedure: SCLEROTHERAPY;  Surgeon: Yetta Flock, MD;  Location: [facility]OS[facility];  Service: Gastroenterology;;   TONSILLECTOMY  1956   WISDOM TOOTH EXTRACTION      Prior to Admission medications   Medication Sig Start Date End Date Taking? Authorizing Provider  ACCRUFER 30 MG CAPS Take 1 capsule by mouth 2 (two) times daily. 08/20/22  Yes [provider]  atorvastatin (LIPITOR) 40 MG tablet Take 1 tablet (40 mg total) by mouth daily. 05/22/22 11/18/22 Yes Cheryln Manly, NP  Cholecalciferol 25 MCG (1000 UT) capsule Take 1,000 Units by mouth daily with supper.    Yes [provider]  isosorbide mononitrate (IMDUR) 60 MG 24 hr tablet Take 1 tablet (60 mg total) by mouth daily. 05/13/22  Yes Minus Breeding, MD  losartan (COZAAR) 25 MG tablet Take 0.5 tablets (12.5 mg total) by mouth daily. 02/05/22  Yes [name], Evie Lacks, MD   NONFORMULARY OR COMPOUNDED ITEM Apply 1 application topically daily as needed (rash). Cetaphil + triamcinolone cream   Yes [provider]  pantoprazole (PROTONIX) 40 MG tablet Take 1 tablet (40 mg total) by mouth 2 (two) times daily. Patient taking differently: Take 40 mg by mouth daily. Pt reports he is taking daily 06/01/22  Yes [name], Evie Lacks, MD  acetaminophen (TYLENOL) 500 MG tablet Take 1,000 mg by mouth every 6 (six) hours as needed for moderate pain, mild pain or headache.    [provider]  Carboxymethylcellulose Sodium (DRY EYE RELIEF OP) Place 1 drop into both eyes daily as needed (Dry eye).    [provider]  clopidogrel (PLAVIX) 75 MG tablet Take 1 tablet (75 mg total) by mouth daily. 05/22/22 05/22/23  Jettie Booze, MD  ELIQUIS 5 MG TABS tablet TAKE ONE TABLET BY MOUTH TWICE DAILY 09/21/22   Deboraha Sprang, MD  furosemide (LASIX) 20 MG tablet Take 1 tablet once daily for 5 days, then take daily as needed 09/08/22  Deboraha Sprang, MD  nitroGLYCERIN (NITROSTAT) 0.4 MG SL tablet Place 1 tablet (0.4 mg total) under the tongue every 5 (five) minutes as needed. 05/22/22   Cheryln Manly, NP    Current Outpatient Medications  Medication Sig Dispense Refill   ACCRUFER 30 MG CAPS Take 1 capsule by mouth 2 (two) times daily.     atorvastatin (LIPITOR) 40 MG tablet Take 1 tablet (40 mg total) by mouth daily. 90 tablet 1   Cholecalciferol 25 MCG (1000 UT) capsule Take 1,000 Units by mouth daily with supper.      isosorbide mononitrate (IMDUR) 60 MG 24 hr tablet Take 1 tablet (60 mg total) by mouth daily. 90 tablet 3   losartan (COZAAR) 25 MG tablet Take 0.5 tablets (12.5 mg total) by mouth daily. 90 tablet 1   NONFORMULARY OR COMPOUNDED ITEM Apply 1 application topically daily as needed (rash). Cetaphil + triamcinolone cream     pantoprazole (PROTONIX) 40 MG tablet Take 1 tablet (40 mg total) by mouth 2 (two) times daily. (Patient taking  differently: Take 40 mg by mouth daily. Pt reports he is taking daily) 180 tablet 1   acetaminophen (TYLENOL) 500 MG tablet Take 1,000 mg by mouth every 6 (six) hours as needed for moderate pain, mild pain or headache.     Carboxymethylcellulose Sodium (DRY EYE RELIEF OP) Place 1 drop into both eyes daily as needed (Dry eye).     clopidogrel (PLAVIX) 75 MG tablet Take 1 tablet (75 mg total) by mouth daily. 30 tablet 5   ELIQUIS 5 MG TABS tablet TAKE ONE TABLET BY MOUTH TWICE DAILY 60 tablet 11   furosemide (LASIX) 20 MG tablet Take 1 tablet once daily for 5 days, then take daily as needed 30 tablet 1   nitroGLYCERIN (NITROSTAT) 0.4 MG SL tablet Place 1 tablet (0.4 mg total) under the tongue every 5 (five) minutes as needed. 25 tablet 2   Current Facility-Administered Medications  Medication Dose Route Frequency Provider Last Rate Last Admin   0.9 %  sodium chloride infusion  500 mL Intravenous Once Cesia Orf, Carlota Raspberry, MD        Allergies as of 11/02/2022 - Review Complete 11/02/2022  Allergen Reaction Noted   Spironolactone  09/29/2021   Cayenne Other (See Comments) 10/29/2015   Niacin and related Other (See Comments) 08/06/2016   Sulfa antibiotics Itching and Rash 07/27/2021   Sulfonamide derivatives Other (See Comments)     Family History  Problem Relation Age of Onset   Multiple myeloma Mother    Diabetes Father    Renal Disease Father    Coronary artery disease Other 73   Diabetes Other    Hyperlipidemia Other    Hypertension Other    Colon cancer Neg Hx    Esophageal cancer Neg Hx    Stomach cancer Neg Hx    Rectal cancer Neg Hx    Colon polyps Neg Hx     Social History   Socioeconomic History   Marital status: Married    Spouse name: Not on file   Number of children: 2   Years of education: 18   Highest education level: Not on file  Occupational History   Occupation: Technical brewer: [employer]    Comment: Tour manager foundation   Tobacco Use   Smoking status: Never   Smokeless tobacco: Never  Vaping Use   Vaping Use: Never used  Substance and Sexual Activity   Alcohol use: Not  Currently    Alcohol/week: 0.0 - 5.0 standard drinks of alcohol    Comment: 1-2 glasses wine   Drug use: No   Sexual activity: Yes    Partners: Female  Other Topics Concern   Not on file  Social History Narrative   [employer] [place], [place].  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  2 daughters-'75, '79; 1 grandchild; step-daughter and step grandson.  Regular exercise-yes, runs 1.5-2 mi 4x/wk, also eliptical      Patient signed a Designated Party Release to allow his wife, Russell Griffith, to have access to his medical records/ information.   Social Determinants of Health   Financial Resource Strain: Low Risk  (01/05/2022)   Overall Financial Resource Strain (CARDIA)    Difficulty of Paying Living Expenses: Not hard at all  Food Insecurity: No Food Insecurity (01/05/2022)   Hunger Vital Sign    Worried About Running Out of Food in the Last Year: Never true    Ran Out of Food in the Last Year: Never true  Transportation Needs: No Transportation Needs (01/05/2022)   PRAPARE - Hydrologist (Medical): No    Lack of Transportation (Non-Medical): No  Physical Activity: Sufficiently Active (01/05/2022)   Exercise Vital Sign    Days of Exercise per Week: 7 days    Minutes of Exercise per Session: 60 min  Stress: No Stress Concern Present (01/05/2022)   [place]    Feeling of Stress : Not at all  Social Connections: [place] (01/05/2022)   Social Connection and Isolation Panel [NHANES]    Frequency of Communication with Friends and Family: More than three times a week    Frequency of Social Gatherings with Friends and Family: More than three times a week    Attends  Religious Services: More than 4 times per year    Active Member of [employer] or Organizations: Yes    Attends Music therapist: More than 4 times per year    Marital Status: Married  Human resources officer Violence: Not At Risk (01/05/2022)   Humiliation, Afraid, Rape, and Kick questionnaire    Fear of Current or Ex-Partner: No    Emotionally Abused: No    Physically Abused: No    Sexually Abused: No    Review of Systems: All other review of systems negative except as mentioned in the HPI.  Physical Exam: Vital signs BP (!) 134/46   Pulse (!) 52   Temp 98.7 F (37.1 C)   Ht _0  (1.803 m)   Wt 182 lb (82.6 kg)   SpO2 97%   BMI 25.38 kg/m   General:   Alert,  Well-developed, pleasant and cooperative in NAD Lungs:  Clear throughout to auscultation.   Heart:  Regular rate and rhythm Abdomen:  Soft, nontender and nondistended.   Neuro/Psych:  Alert and cooperative. Normal mood and affect. A and O x 3  Jolly Mango, MD [facility]

## 2022-11-02 NOTE — Progress Notes (Signed)
Pt resting comfortably. VSS. Airway intact. SBAR complete to RN. All questions answered.   

## 2022-11-02 NOTE — Progress Notes (Signed)
Pt's states no medical or surgical changes since previsit or office visit. 

## 2022-11-02 NOTE — Patient Instructions (Addendum)
Patient has a contact number available for emergencies. The signs and symptoms of potential delayed complications were discussed with the patient. Return to normal activities tomorrow. Written discharge instructions were provided to the patient. - Resume previous diet. - Continue present medications. - Resume Plavix tomorrow (Tuesday), resume Eliquis on Wed - Await pathology results. - Contact me with any bleeding symptoms Diversatek clips x 4 placed Patient received wallet card with information about MRI compatibility  YOU HAD AN ENDOSCOPIC PROCEDURE TODAY: Refer to the procedure report and other information in the discharge instructions given to you for any specific questions about what was found during the examination. If this information does not answer your questions, please call [place] office at [phone] to clarify.   YOU SHOULD EXPECT: Some feelings of bloating in the abdomen. Passage of more gas than usual. Walking can help get rid of the air that was put into your GI tract during the procedure and reduce the bloating. If you had a lower endoscopy (such as a colonoscopy or flexible sigmoidoscopy) you may notice spotting of blood in your stool or on the toilet paper. Some abdominal soreness may be present for a day or two, also.  DIET: Your first meal following the procedure should be a light meal and then it is ok to progress to your normal diet. A half-sandwich or bowl of soup is an example of a good first meal. Heavy or fried foods are harder to digest and may make you feel nauseous or bloated. Drink plenty of fluids but you should avoid alcoholic beverages for 24 hours. If you had a esophageal dilation, please see attached instructions for diet.    ACTIVITY: Your care partner should take you home directly after the procedure. You should plan to take it easy, moving slowly for the rest of the day. You can resume normal activity the day after the procedure however YOU SHOULD NOT DRIVE,  use power tools, machinery or perform tasks that involve climbing or major physical exertion for 24 hours (because of the sedation medicines used during the test).   SYMPTOMS TO REPORT IMMEDIATELY: A gastroenterologist can be reached at any hour. Please call [phone]  for any of the following symptoms:  Following upper endoscopy (EGD, EUS, ERCP, esophageal dilation) Vomiting of blood or coffee ground material  New, significant abdominal pain  New, significant chest pain or pain under the shoulder blades  Painful or persistently difficult swallowing  New shortness of breath  Black, tarry-looking or red, bloody stools  FOLLOW UP:  If any biopsies were taken you will be contacted by phone or by letter within the next 1-3 weeks. Call [phone]  if you have not heard about the biopsies in 3 weeks.  Please also call with any specific questions about appointments or follow up tests.

## 2022-11-02 NOTE — Progress Notes (Signed)
CO2 exhalation palpated thoughout the procedure, ETCO2 via [place] with poor waveform.

## 2022-11-03 ENCOUNTER — Telehealth: Payer: Self-pay | Admitting: *Deleted

## 2022-11-03 ENCOUNTER — Telehealth: Payer: Self-pay

## 2022-11-03 NOTE — Telephone Encounter (Signed)
  Follow up Call-     11/02/2022    2:18 PM 06/25/2020    2:16 PM  Call back number  Post procedure Call Back phone  # [phone]  Permission to leave phone message Yes Yes     Patient questions:  Do you have a fever, pain , or abdominal swelling? No. Pain Score  0 *  Have you tolerated food without any problems? Yes.    Have you been able to return to your normal activities? Yes.    Do you have any questions about your discharge instructions: Diet   No. Medications  No. Follow up visit  No.  Do you have questions or concerns about your Care? No.  Actions: * If pain score is 4 or above: No action needed, pain <4.

## 2022-11-03 NOTE — Telephone Encounter (Signed)
Spoke with pt, aware he can stop plavix.

## 2022-11-03 NOTE — Telephone Encounter (Signed)
-----   Message from Minus Breeding, MD sent at 11/03/2022 11:12 AM EST ----- Regarding: RE: mutual patient It is a tough call but with his bleeding issues we can stop the Plavix now.  I went back and reviewed the cath and the suggestions from the [place] doctor.  They suggested six months only of Plavix instead of one year.  Often I will continue both but in his situation we can stop the Plavix.   ----- Message ----- From: Yetta Flock, MD Sent: 11/02/2022   5:14 PM EST To: Minus Breeding, MD Subject: mutual patient                                 Hello,  EGD done today for this patient and removed 2 gastric polyps. I was communicating instructions to the patient and his wife as to when to resume his Plavix (tomorrow), and Eliquis, however, the patient's wife was under the impression he was going to stop Plavix in a few weeks anyway and wanted to know if they could just stop it now. I told them I did not think that was the plan based on review of recent notes but would reach out to you to ask. I am assuming you want him on both the Plavix and Eliquis? Thanks for any clarification.  Russell Griffith

## 2022-11-06 ENCOUNTER — Encounter: Payer: Self-pay | Admitting: Gastroenterology

## 2022-11-06 ENCOUNTER — Telehealth (HOSPITAL_COMMUNITY): Payer: Self-pay | Admitting: *Deleted

## 2022-11-06 NOTE — Telephone Encounter (Signed)
Attempted to call patient regarding upcoming cardiac CT appointment. °Left message on voicemail with name and callback number ° °Darlene Brozowski RN Navigator Cardiac Imaging °[facility] °[phone] Office °[phone] Cell ° °

## 2022-11-09 ENCOUNTER — Ambulatory Visit (HOSPITAL_COMMUNITY)
Admission: RE | Admit: 2022-11-09 | Discharge: 2022-11-09 | Disposition: A | Discharge status: Home / Self Care | Payer: PPO | Source: Ambulatory Visit | Attending: Cardiology | Admitting: Cardiology

## 2022-11-09 DIAGNOSIS — I4891 Unspecified atrial fibrillation: Secondary | ICD-10-CM | POA: Insufficient documentation

## 2022-11-09 DIAGNOSIS — Z01818 Encounter for other preprocedural examination: Secondary | ICD-10-CM | POA: Diagnosis not present

## 2022-11-09 MED ORDER — IOHEXOL 350 MG/ML SOLN
95.0000 mL | Freq: Once | INTRAVENOUS | Status: AC | PRN
  Administered 2022-11-09: 95 mL via INTRAVENOUS

## 2022-11-16 ENCOUNTER — Encounter: Payer: Self-pay | Admitting: Cardiology

## 2022-11-25 ENCOUNTER — Other Ambulatory Visit: Payer: Self-pay | Admitting: Cardiology

## 2022-11-30 ENCOUNTER — Encounter: Payer: Self-pay | Admitting: Internal Medicine

## 2022-12-02 ENCOUNTER — Other Ambulatory Visit: Payer: Self-pay | Admitting: Internal Medicine

## 2022-12-02 DIAGNOSIS — D649 Anemia, unspecified: Secondary | ICD-10-CM

## 2022-12-02 DIAGNOSIS — I10 Essential (primary) hypertension: Secondary | ICD-10-CM

## 2022-12-04 ENCOUNTER — Other Ambulatory Visit (INDEPENDENT_AMBULATORY_CARE_PROVIDER_SITE_OTHER): Payer: PPO

## 2022-12-04 DIAGNOSIS — D649 Anemia, unspecified: Secondary | ICD-10-CM

## 2022-12-04 DIAGNOSIS — I10 Essential (primary) hypertension: Secondary | ICD-10-CM | POA: Diagnosis not present

## 2022-12-04 LAB — CBC WITH DIFFERENTIAL/PLATELET
Basophils Absolute: 0 10*3/uL (ref 0.0–0.1)
Basophils Relative: 0.6 % (ref 0.0–3.0)
Eosinophils Absolute: 0.1 10*3/uL (ref 0.0–0.7)
Eosinophils Relative: 2.1 % (ref 0.0–5.0)
HCT: 39.7 % (ref 39.0–52.0)
Hemoglobin: 13.4 g/dL (ref 13.0–17.0)
Lymphocytes Relative: 27.8 % (ref 12.0–46.0)
Lymphs Abs: 1.3 10*3/uL (ref 0.7–4.0)
MCHC: 33.6 g/dL (ref 30.0–36.0)
MCV: 84.3 fl (ref 78.0–100.0)
Monocytes Absolute: 0.9 10*3/uL (ref 0.1–1.0)
Monocytes Relative: 19.8 % — ABNORMAL HIGH (ref 3.0–12.0)
Neutro Abs: 2.3 10*3/uL (ref 1.4–7.7)
Neutrophils Relative %: 49.7 % (ref 43.0–77.0)
Platelets: 153 10*3/uL (ref 150.0–400.0)
RBC: 4.71 Mil/uL (ref 4.22–5.81)
RDW: 19.7 % — ABNORMAL HIGH (ref 11.5–15.5)
WBC: 4.7 10*3/uL (ref 4.0–10.5)

## 2022-12-04 LAB — COMPREHENSIVE METABOLIC PANEL
ALT: 19 U/L (ref 0–53)
AST: 27 U/L (ref 0–37)
Albumin: 4.2 g/dL (ref 3.5–5.2)
Alkaline Phosphatase: 110 U/L (ref 39–117)
BUN: 17 mg/dL (ref 6–23)
CO2: 28 mEq/L (ref 19–32)
Calcium: 9.5 mg/dL (ref 8.4–10.5)
Chloride: 105 mEq/L (ref 96–112)
Creatinine, Ser: 0.98 mg/dL (ref 0.40–1.50)
GFR: 75.76 mL/min (ref >60.00)
Glucose, Bld: 94 mg/dL (ref 70–99)
Potassium: 3.9 mEq/L (ref 3.5–5.1)
Sodium: 141 mEq/L (ref 135–145)
Total Bilirubin: 3.6 mg/dL — ABNORMAL HIGH (ref 0.2–1.2)
Total Protein: 6.7 g/dL (ref 6.0–8.3)

## 2022-12-05 LAB — IRON,TIBC AND FERRITIN PANEL
%SAT: 15 % (calc) — ABNORMAL LOW (ref 20–48)
Ferritin: 22 ng/mL — ABNORMAL LOW (ref 24–380)
Iron: 59 ug/dL (ref 50–180)
TIBC: 402 mcg/dL (calc) (ref 250–425)

## 2022-12-07 ENCOUNTER — Encounter: Payer: Self-pay | Admitting: Internal Medicine

## 2022-12-10 ENCOUNTER — Ambulatory Visit (INDEPENDENT_AMBULATORY_CARE_PROVIDER_SITE_OTHER): Payer: PPO

## 2022-12-10 DIAGNOSIS — I495 Sick sinus syndrome: Secondary | ICD-10-CM | POA: Diagnosis not present

## 2022-12-10 LAB — CUP PACEART REMOTE DEVICE CHECK
Battery Remaining Longevity: 183 mo
Battery Voltage: 3.22 V
Brady Statistic AP VP Percent: 0 %
Brady Statistic AP VS Percent: 0 %
Brady Statistic AS VP Percent: 0.43 %
Brady Statistic AS VS Percent: 99.57 %
Brady Statistic RA Percent Paced: 0 %
Brady Statistic RV Percent Paced: 0.43 %
Date Time Interrogation Session: 20240111012059
Implantable Lead Connection Status: 753985
Implantable Lead Connection Status: 753985
Implantable Lead Implant Date: 20130816
Implantable Lead Implant Date: 20130816
Implantable Lead Location: 753859
Implantable Lead Location: 753860
Implantable Lead Model: 5076
Implantable Lead Model: 5076
Implantable Pulse Generator Implant Date: 20231011
Lead Channel Impedance Value: 285 Ohm
Lead Channel Impedance Value: 437 Ohm
Lead Channel Impedance Value: 570 Ohm
Lead Channel Impedance Value: 817 Ohm
Lead Channel Pacing Threshold Amplitude: 2.25 V
Lead Channel Pacing Threshold Pulse Width: 0.4 ms
Lead Channel Sensing Intrinsic Amplitude: 0.375 mV
Lead Channel Sensing Intrinsic Amplitude: 14.875 mV
Lead Channel Sensing Intrinsic Amplitude: 14.875 mV
Lead Channel Setting Pacing Amplitude: 2.5 V
Lead Channel Setting Pacing Pulse Width: 1 ms
Lead Channel Setting Sensing Sensitivity: 0.9 mV
Zone Setting Status: 755011

## 2022-12-14 ENCOUNTER — Encounter: Payer: PPO | Admitting: Internal Medicine

## 2022-12-18 ENCOUNTER — Ambulatory Visit (INDEPENDENT_AMBULATORY_CARE_PROVIDER_SITE_OTHER): Payer: PPO | Admitting: Nurse Practitioner

## 2022-12-18 VITALS — BP 112/64 | HR 83 | Temp 100.5°F | Ht 71.0 in | Wt 183.5 lb

## 2022-12-18 DIAGNOSIS — N390 Urinary tract infection, site not specified: Secondary | ICD-10-CM | POA: Insufficient documentation

## 2022-12-18 DIAGNOSIS — D649 Anemia, unspecified: Secondary | ICD-10-CM

## 2022-12-18 LAB — POCT URINALYSIS DIPSTICK
Bilirubin, UA: NEGATIVE
Glucose, UA: NEGATIVE
Nitrite, UA: POSITIVE
Protein, UA: POSITIVE — AB
Spec Grav, UA: 1.025 (ref 1.010–1.025)
Urobilinogen, UA: 1 E.U./dL
pH, UA: 6 (ref 5.0–8.0)

## 2022-12-18 LAB — POCT INFLUENZA A/B
Influenza A, POC: NEGATIVE
Influenza B, POC: NEGATIVE

## 2022-12-18 LAB — FERRITIN: Ferritin: 82.1 ng/mL (ref 22.0–322.0)

## 2022-12-18 LAB — HEMOCCULT GUIAC POC 1CARD (OFFICE): Fecal Occult Blood, POC: NEGATIVE

## 2022-12-18 LAB — POCT RAPID STREP A (OFFICE): Rapid Strep A Screen: NEGATIVE

## 2022-12-18 LAB — POC COVID19 BINAXNOW: SARS Coronavirus 2 Ag: NEGATIVE

## 2022-12-18 LAB — POCT RESPIRATORY SYNCYTIAL VIRUS: RSV Rapid Ag: NEGATIVE

## 2022-12-18 MED ORDER — AMOXICILLIN-POT CLAVULANATE 875-125 MG PO TABS: 1.0000 | ORAL_TABLET | Freq: Two times a day (BID) | ORAL | 0 refills | Status: DC

## 2022-12-18 NOTE — Progress Notes (Signed)
Established Patient Office Visit  Subjective   Patient ID: Russell Griffith, male    DOB: 08-17-1946  Age: 77 y.o. MRN: [id number]  Chief Complaint  Patient presents with   Dysuria    Patient has for acute visit for the above accompanied by his wife.  Reports had nausea, vomiting, diarrhea starting 5 days ago.  Since then vomiting and diarrhea has stopped.  He still is experiencing fatigue and now experiencing dysuria.  He reports history of GI bleed in July 2023.  At that time he was haivng dark tarry stools and sent to ER by his PCP, found to have upper GI bleed. Treated with APC and hemoclips. Also had iron infusions and then started on PO iron after discharge, did not tolerate over-the-counter iron.  Was prescribed Acker for which she did tolerate.  Took this for a few weeks, but has stopped taking over the last 3 to 4 weeks.  Last labs from approximately 2 weeks ago show normal hemoglobin at 13.4, with ferritin of 22 and %sat of 15.    They are concerned that he may be experiencing urinary tract infection or the fatigue may be a sign of worsening anemia again.  Patient denies any black tarry stools, has noted some bright red blood but feels this may be related to hemorrhoids and recent diarrhea.  Patient has history of prolonged Qt-C interval, last EKG in 08/2022 shoes QTC of 505. Also has history of Gilbert's syndrome and last bilirubin from 12/04/21 was 3.6.  Estimated Creatinine Clearance: 70.4 mL/min (by C-G formula based on SCr of 0.98 mg/dL).     Review of Systems  Respiratory:  Negative for cough and shortness of breath.   Gastrointestinal:  Negative for diarrhea, nausea and vomiting.  Genitourinary:  Positive for dysuria.      Objective:     BP 112/64   Pulse 83   Temp (!) 100.5 F (38.1 C) (Temporal)   Ht '5\' 11"'$  (1.803 m)   Wt 183 lb 8 oz (83.2 kg)   SpO2 92%   BMI 25.59 kg/m  BP Readings from Last 3 Encounters:  12/18/22 112/64  11/09/22 125/77  11/02/22  131/69   Wt Readings from Last 3 Encounters:  12/18/22 183 lb 8 oz (83.2 kg)  11/02/22 182 lb (82.6 kg)  10/27/22 182 lb (82.6 kg)      Physical Exam Vitals reviewed. Exam conducted with a chaperone present.  Constitutional:      Appearance: Normal appearance.  HENT:     Head: Normocephalic and atraumatic.  Cardiovascular:     Rate and Rhythm: Normal rate and regular rhythm.  Pulmonary:     Effort: Pulmonary effort is normal.     Breath sounds: Normal breath sounds.  Genitourinary:    Prostate: Normal.     Rectum: Guaiac result negative. External hemorrhoid present.  Musculoskeletal:     Cervical back: Neck supple.  Skin:    General: Skin is warm and dry.  Neurological:     Mental Status: He is alert and oriented to person, place, and time.  Psychiatric:        Mood and Affect: Mood normal.        Behavior: Behavior normal.        Thought Content: Thought content normal.        Judgment: Judgment normal.      Results for orders placed or performed in visit on 12/18/22  POC COVID-19  Result Value Ref Range  SARS Coronavirus 2 Ag Negative Negative  POCT rapid strep A  Result Value Ref Range   Rapid Strep A Screen Negative Negative  POCT Influenza A/B  Result Value Ref Range   Influenza A, POC Negative Negative   Influenza B, POC Negative Negative  POCT respiratory syncytial virus  Result Value Ref Range   RSV Rapid Ag negative   POCT urinalysis dipstick  Result Value Ref Range   Color, UA red    Clarity, UA     Glucose, UA Negative Negative   Bilirubin, UA negative    Ketones, UA 2+    Spec Grav, UA 1.025 1.010 - 1.025   Blood, UA 3+    pH, UA 6.0 5.0 - 8.0   Protein, UA Positive (A) Negative   Urobilinogen, UA 1.0 0.2 or 1.0 E.U./dL   Nitrite, UA positive    Leukocytes, UA Moderate (2+) (A) Negative   Appearance     Odor    POCT Occult Blood Stool  Result Value Ref Range   Fecal Occult Blood, POC Negative Negative   Card #1 Date     Card #2  Fecal Occult Blod, POC     Card #2 Date     Card #3 Fecal Occult Blood, POC     Card #3 Date        The ASCVD Risk score (Arnett DK, et al., 2019) failed to calculate for the following reasons:   The valid total cholesterol range is 130 to 320 mg/dL    Assessment & Plan:   Problem List Items Addressed This Visit       Genitourinary   Complicated UTI (urinary tract infection) - Primary    Acute UTI following gastroenteritis, due to patients allergy to sulfa and QTC prolongation not able to treat with flouroquinolone or bactrim.  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  Per pharmacist recommendation will treat with Augmentin and not administer ceftriaxone due to possible risk of additional hyperbilirubinemia.  Thus, will prescribe augmentin BID x 14 days. Patient to follow-up in 2 weeks for close monitoring. Or proceed to the ER if he experiences worsening symptoms, higher fever, visible hematuria, worsening fatigue, reoccurrence of diarrhea. Recommended he follow a "BRAT" diet and focus on hydration until appetite has normalized.  Husband and wife were educated on treatment recommendation and why this recommendation was made.  They report their understanding.      Relevant Medications   amoxicillin-clavulanate (AUGMENTIN) 875-125 MG tablet   Other Relevant Orders   CBC   Urinalysis with Culture, if indicated   Urine Culture   Iron and TIBC   Ferritin   POC COVID-19 (Completed)   POCT rapid strep A (Completed)   POCT Influenza A/B (Completed)   POCT respiratory syncytial virus (Completed)   POCT urinalysis dipstick (Completed)   Comprehensive metabolic panel     Other   Anemia    Will check CBC, ferritin, iron levels. Further recommendations may be made based on these results.        Relevant Orders   CBC   Urinalysis with Culture, if indicated    Urine Culture   Iron and TIBC   Ferritin   POCT Occult Blood Stool (Completed)    Return in about 2 weeks (around 01/01/2023) for F/U with Dr. Alain Marion for close monitoring .    Ailene Ards, NP

## 2022-12-18 NOTE — Assessment & Plan Note (Addendum)
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  Per pharmacist recommendation will treat with Augmentin and not administer ceftriaxone due to possible risk of additional hyperbilirubinemia.  Thus, will prescribe augmentin BID x 14 days. Patient to follow-up in 2 weeks for close monitoring. Or proceed to the ER if he experiences worsening symptoms, higher fever, visible hematuria, worsening fatigue, reoccurrence of diarrhea. Recommended he follow a "BRAT" diet and focus on hydration until appetite has normalized.  Husband and wife were educated on treatment recommendation and why this recommendation was made.  They report their understanding.

## 2022-12-18 NOTE — Patient Instructions (Addendum)
Follow the "BRAT" diet until your appetite returns. This includes bananas, rice, applesauce, toast.  You were prescribed augmentin (an antibiotic). Take 1 tablet by mouth twice a day for 14 days.   Will notify you of your lab results as well once they are back. If your urine culture shows that we need to change to a different antibiotic you will be notified. If your symptoms worsen (higher fever, worse fatigue, dizziness, seeing blood in urine, reoccurrence of diarrhea and/or vomiting) then you need to proceed to the hospital for IV antibiotics.   Please follow-up with Dr. Alain Marion in 2 weeks for close monitoring.

## 2022-12-18 NOTE — Assessment & Plan Note (Signed)
Will check CBC, ferritin, iron levels. Further recommendations may be made based on these results.

## 2022-12-19 LAB — IRON AND TIBC
Iron Saturation: 4 % — CL (ref 15–55)
Iron: 16 ug/dL — ABNORMAL LOW (ref 38–169)
Total Iron Binding Capacity: 393 ug/dL (ref 250–450)
UIBC: 377 ug/dL — ABNORMAL HIGH (ref 111–343)

## 2022-12-20 ENCOUNTER — Encounter: Payer: Self-pay | Admitting: Internal Medicine

## 2022-12-21 LAB — URINE CULTURE

## 2022-12-22 ENCOUNTER — Other Ambulatory Visit (INDEPENDENT_AMBULATORY_CARE_PROVIDER_SITE_OTHER): Payer: PPO

## 2022-12-22 ENCOUNTER — Other Ambulatory Visit: Payer: Self-pay | Admitting: Nurse Practitioner

## 2022-12-22 DIAGNOSIS — D649 Anemia, unspecified: Secondary | ICD-10-CM

## 2022-12-22 DIAGNOSIS — E876 Hypokalemia: Secondary | ICD-10-CM

## 2022-12-22 DIAGNOSIS — N390 Urinary tract infection, site not specified: Secondary | ICD-10-CM

## 2022-12-22 LAB — COMPREHENSIVE METABOLIC PANEL
ALT: 27 U/L (ref 0–53)
AST: 32 U/L (ref 0–37)
Albumin: 3.6 g/dL (ref 3.5–5.2)
Alkaline Phosphatase: 155 U/L — ABNORMAL HIGH (ref 39–117)
BUN: 20 mg/dL (ref 6–23)
CO2: 27 mEq/L (ref 19–32)
Calcium: 8.5 mg/dL (ref 8.4–10.5)
Chloride: 99 mEq/L (ref 96–112)
Creatinine, Ser: 1.02 mg/dL (ref 0.40–1.50)
GFR: 72.18 mL/min (ref >60.00)
Glucose, Bld: 98 mg/dL (ref 70–99)
Potassium: 3.3 mEq/L — ABNORMAL LOW (ref 3.5–5.1)
Sodium: 134 mEq/L — ABNORMAL LOW (ref 135–145)
Total Bilirubin: 4.4 mg/dL — ABNORMAL HIGH (ref 0.2–1.2)
Total Protein: 6.6 g/dL (ref 6.0–8.3)

## 2022-12-22 LAB — CBC
HCT: 36.6 % — ABNORMAL LOW (ref 39.0–52.0)
Hemoglobin: 12.6 g/dL — ABNORMAL LOW (ref 13.0–17.0)
MCHC: 34.4 g/dL (ref 30.0–36.0)
MCV: 82.3 fl (ref 78.0–100.0)
Platelets: 163 10*3/uL (ref 150.0–400.0)
RBC: 4.45 Mil/uL (ref 4.22–5.81)
RDW: 17.2 % — ABNORMAL HIGH (ref 11.5–15.5)
WBC: 8.8 10*3/uL (ref 4.0–10.5)

## 2022-12-22 MED ORDER — CEPHALEXIN 500 MG PO CAPS: 500.0000 mg | ORAL_CAPSULE | Freq: Two times a day (BID) | ORAL | 0 refills | Status: DC

## 2022-12-22 MED ORDER — POTASSIUM CHLORIDE CRYS ER 20 MEQ PO TBCR: 20.0000 meq | EXTENDED_RELEASE_TABLET | Freq: Every day | ORAL | 0 refills | Status: DC

## 2022-12-22 NOTE — Progress Notes (Addendum)
Update 1658: CMP returned showing hypokalemia, hyponatremia, elevated alkaline phosphatase, and elevated bilirubin. Consulted with supervising physician and based on last urine culture and concerns for worsening electrolyte abnormalities with fosfomycin will treat with course of keflex as patient has taken and tolerated this in the past. Will also replace his potassium with 42mq of potassium daily x 5 days. Patient notified of the above and told to monitor for any worsening signs of liver dysfunction such as as worsening fatigue, jaundice of skin/eyes, and if this were to occur to stop the keflex. He is scheduled to follow-up with PCP next week and was encouraged to follow-up as scheduled. He reports his understanding and plans to follow-up as scheduled.   Estimated Creatinine Clearance: 67.7 mL/min (by C-G formula based on SCr of 1.02 mg/dL).

## 2022-12-22 NOTE — Progress Notes (Signed)
Called patient to discuss changing to a different antibiotic as his urine culture shows that the bacteria growing is resistant to Augmentin. I have consulted with Dr. Alain Marion and pharmacy regarding treatment options as patient has sulfa allergy listed in chart and has QTc prolongation so would prefer to avoid bactrim and flouroquinolones. Per pharmacy patient was prescribed bactrim in 2010. Patient does not recall what his allergic reaction is to sulfa and per Dr. Alain Marion and pharmacy fosfomycin may be an option as well. Unfortunately, metabolic panel was not drawn when other labs were drawn on Friday. Patient had recently been dealing with vomiting and diarrhea. I have called him to tell him to come back to the office for stat metabolic panel to be drawn and patient reports he will be able to be there this afternoon. Order has been placed will prescribe an alternative antibiotic pending these results.

## 2022-12-22 NOTE — Addendum Note (Signed)
Addended by: Ailene Ards on: 12/22/2022 05:07 PM   Modules accepted: Orders

## 2022-12-22 NOTE — Progress Notes (Signed)
Update: I performed chart review and see that bactrim was prescribed back in 2010 for prostatitis, but it was changed to ciproflocaxin once it was recognized that he is allergic to sulfa. It is not clear if he ever took the bactrim. Thus, pending labs will plan on prescribing fosfomycin.

## 2022-12-23 ENCOUNTER — Other Ambulatory Visit: Payer: Self-pay | Admitting: Internal Medicine

## 2022-12-23 MED ORDER — FOSFOMYCIN TROMETHAMINE 3 G PO PACK: 3.0000 g | PACK | Freq: Once | ORAL | 0 refills | Status: AC

## 2022-12-23 MED ORDER — ACCRUFER 30 MG PO CAPS: 1.0000 | ORAL_CAPSULE | Freq: Two times a day (BID) | ORAL | 5 refills | Status: DC

## 2022-12-24 ENCOUNTER — Telehealth: Payer: Self-pay | Admitting: Nurse Practitioner

## 2022-12-24 DIAGNOSIS — E876 Hypokalemia: Secondary | ICD-10-CM

## 2022-12-24 MED ORDER — POTASSIUM CHLORIDE CRYS ER 20 MEQ PO TBCR: 20.0000 meq | EXTENDED_RELEASE_TABLET | Freq: Every day | ORAL | 0 refills | Status: DC

## 2022-12-24 NOTE — Telephone Encounter (Signed)
Please call patient and let him know that Dr. Alain Marion wants him to be on the potassium supplement for 10 days so I have sent in an additional 5 days of potassium tablets (1 tablet by mouth once per day) to his pharmacy.

## 2022-12-24 NOTE — Telephone Encounter (Signed)
Noted. Thx.

## 2022-12-24 NOTE — Telephone Encounter (Signed)
Called pt after MyChart message, pt stated he has gotten schedule to see one of the MD providers

## 2022-12-24 NOTE — Progress Notes (Signed)
Subjective:    Patient ID: Russell Griffith, male    DOB: Mar 23, 1946, 77 y.o.   MRN: [id number]      HPI Terence is here for  Chief Complaint  Patient presents with   Ankle Pain    Right ankle pain and swelling x 1 week    Shanon Rosser 1/19 for dysuria.  Had recent N/V/D from gastroenteritis - mild anemia, low iron, ow K.  Urine had infection - started on Augmentin x4 days which was changed to keflex x 2 days then fosfomycin x 1.    Swelling right ankle and foot, pain with walking, started several days ago when he started taking Augmentin. Started as mild swelling and has gotten worse.  It is painful and hurts to walk.  No redness, some warmth..  No injury.  Was initially subtle and got worse.       Medications and allergies reviewed with patient and updated if appropriate.  Current Outpatient Medications on File Prior to Visit  Medication Sig Dispense Refill   ACCRUFER 30 MG CAPS Take 1 capsule (30 mg total) by mouth 2 (two) times daily. 60 capsule 5   acetaminophen (TYLENOL) 500 MG tablet Take 1,000 mg by mouth every 6 (six) hours as needed for moderate pain, mild pain or headache.     atorvastatin (LIPITOR) 40 MG tablet Take 1 tablet (40 mg total) by mouth daily. 90 tablet 1   Carboxymethylcellulose Sodium (DRY EYE RELIEF OP) Place 1 drop into both eyes daily as needed (Dry eye).     Cholecalciferol 25 MCG (1000 UT) capsule Take 1,000 Units by mouth daily with supper.      ELIQUIS 5 MG TABS tablet TAKE ONE TABLET BY MOUTH TWICE DAILY 60 tablet 11   fosfomycin (MONUROL) 3 g PACK SMARTSIG:3 Gram(s) By Mouth Once     furosemide (LASIX) 20 MG tablet Take 1 tablet once daily for 5 days, then take daily as needed 30 tablet 1   isosorbide mononitrate (IMDUR) 60 MG 24 hr tablet Take 1 tablet (60 mg total) by mouth daily. 90 tablet 3   losartan (COZAAR) 25 MG tablet Take 0.5 tablets (12.5 mg total) by mouth daily. 90 tablet 1   nitroGLYCERIN (NITROSTAT) 0.4 MG SL tablet Place 1 tablet  (0.4 mg total) under the tongue every 5 (five) minutes as needed. 25 tablet 2   NONFORMULARY OR COMPOUNDED ITEM Apply 1 application topically daily as needed (rash). Cetaphil + triamcinolone cream     pantoprazole (PROTONIX) 40 MG tablet Take 1 tablet (40 mg total) by mouth 2 (two) times daily. (Patient taking differently: Take 40 mg by mouth daily. Pt reports he is taking daily) 180 tablet 1   potassium chloride SA (KLOR-CON M) 20 MEQ tablet Take 1 tablet (20 mEq total) by mouth daily. 5 tablet 0   No current facility-administered medications on file prior to visit.    Review of Systems     Objective:   Vitals:   12/25/22 0756  BP: 130/60  Pulse: 65  Temp: 98.1 F (36.7 C)  SpO2: 98%   BP Readings from Last 3 Encounters:  12/25/22 130/60  12/18/22 112/64  11/09/22 125/77   Wt Readings from Last 3 Encounters:  12/25/22 183 lb (83 kg)  12/18/22 183 lb 8 oz (83.2 kg)  11/02/22 182 lb (82.6 kg)   Body mass index is 25.52 kg/m.    Physical Exam Constitutional:      General: He is not in  acute distress.    Appearance: Normal appearance. He is not ill-appearing.  HENT:     Head: Normocephalic and atraumatic.  Musculoskeletal:        General: Swelling (Right ankle-moderate) and tenderness (Tenderness with palpation around right ankle and dorsal aspect of foot-increased pain with passive and active movement meant, increased pain with bearing weight) present.     Right lower leg: Edema (1+ pitting edema right ankle and right dorsal foot) present.     Left lower leg: No edema.  Skin:    General: Skin is warm and dry.     Findings: Erythema (Minimal erythema right ankle and dorsal foot) present. No lesion or rash.  Neurological:     Mental Status: He is alert.     Sensory: No sensory deficit.     Motor: Weakness (Related to pain) present.            Assessment & Plan:    See Problem List for Assessment and Plan of chronic medical problems.

## 2022-12-24 NOTE — Telephone Encounter (Signed)
Made pt aware of Sarah's note, pt showed understanding. Pt had concern about swelling right ankle and foot. When pressure is put on like walking he starts experiencing pain, started last weekend when started taking the Augmentin.  Pt wants to know what can be prescribed for him to help with it and also, if he needs to be seen before anything can be prescribed.

## 2022-12-25 ENCOUNTER — Encounter: Payer: Self-pay | Admitting: Internal Medicine

## 2022-12-25 ENCOUNTER — Ambulatory Visit (INDEPENDENT_AMBULATORY_CARE_PROVIDER_SITE_OTHER): Payer: PPO | Admitting: Internal Medicine

## 2022-12-25 VITALS — BP 130/60 | HR 65 | Temp 98.1°F | Ht 71.0 in | Wt 183.0 lb

## 2022-12-25 DIAGNOSIS — M10071 Idiopathic gout, right ankle and foot: Secondary | ICD-10-CM

## 2022-12-25 DIAGNOSIS — M109 Gout, unspecified: Secondary | ICD-10-CM | POA: Insufficient documentation

## 2022-12-25 MED ORDER — PREDNISONE 10 MG PO TABS: ORAL_TABLET | ORAL | 0 refills | Status: DC

## 2022-12-25 NOTE — Patient Instructions (Addendum)
You may have gout    Medications changes include :   prednisone taper     Return if symptoms worsen or fail to improve.    Gout  Gout is a condition that causes painful swelling of the joints. Gout is a type of inflammation of the joints (arthritis). This condition is caused by having too much uric acid in the body. Uric acid is a chemical that forms when the body breaks down substances called purines. Purines are important for building body proteins. When the body has too much uric acid, sharp crystals can form and build up inside the joints. This causes pain and swelling. Gout attacks can happen quickly and may be very painful (acute gout). Over time, the attacks can affect more joints and become more frequent (chronic gout). Gout can also cause uric acid to build up under the skin and inside the kidneys. What are the causes? This condition is caused by too much uric acid in your blood. This can happen because: Your kidneys do not remove enough uric acid from your blood. This is the most common cause. Your body makes too much uric acid. This can happen with some cancers and cancer treatments. It can also occur if your body is breaking down too many red blood cells (hemolytic anemia). You eat too many foods that are high in purines. These foods include organ meats and some seafood. Alcohol, especially beer, is also high in purines. A gout attack may be triggered by trauma or stress. What increases the risk? The following factors may make you more likely to develop this condition: Having a family history of gout. Being male and middle-aged. Being male and having gone through menopause. Taking certain medicines, including aspirin, cyclosporine, diuretics, levodopa, and niacin. Having an organ transplant. Having certain conditions, such as: Being obese. Lead poisoning. Kidney disease. A skin condition called psoriasis. Other factors include: Losing weight too quickly. Being  dehydrated. Frequently drinking alcohol, especially beer. Frequently drinking beverages that are sweetened with a type of sugar called fructose. What are the signs or symptoms? An attack of acute gout happens quickly. It usually occurs in just one joint. The most common place is the big toe. Attacks often start at night. Other joints that may be affected include joints of the feet, ankle, knee, fingers, wrist, or elbow. Symptoms of this condition may include: Severe pain. Warmth. Swelling. Stiffness. Tenderness. The affected joint may be very painful to touch. Shiny, red, or purple skin. Chills and fever. Chronic gout may cause symptoms more frequently. More joints may be involved. You may also have white or yellow lumps (tophi) on your hands or feet or in other areas near your joints. How is this diagnosed? This condition is diagnosed based on your symptoms, your medical history, and a physical exam. You may have tests, such as: Blood tests to measure uric acid levels. Removal of joint fluid with a thin needle (aspiration) to look for uric acid crystals. X-rays to look for joint damage. How is this treated? Treatment for this condition has two phases: treating an acute attack and preventing future attacks. Acute gout treatment may include medicines to reduce pain and swelling, including: NSAIDs, such as ibuprofen. Steroids. These are strong anti-inflammatory medicines that can be taken by mouth (orally) or injected into a joint. Colchicine. This medicine relieves pain and swelling when it is taken soon after an attack. It can be given by mouth or through an IV. Preventive treatment may include: Daily  use of smaller doses of NSAIDs or colchicine. Use of a medicine that reduces uric acid levels in your blood, such as allopurinol. Changes to your diet. You may need to see a dietitian about what to eat and drink to prevent gout. Follow these instructions at home: During a gout attack  If  directed, put ice on the affected area. To do this: Put ice in a plastic bag. Place a towel between your skin and the bag. Leave the ice on for 20 minutes, 2-3 times a day. Remove the ice if your skin turns bright red. This is very important. If you cannot feel pain, heat, or cold, you have a greater risk of damage to the area. Raise (elevate) the affected joint above the level of your heart as often as possible. Rest the joint as much as possible. If the affected joint is in your leg, you may be given crutches to use. Follow instructions from your health care provider about eating or drinking restrictions. Avoiding future gout attacks Follow a low-purine diet as told by your dietitian or health care provider. Avoid foods and drinks that are high in purines, including liver, kidney, anchovies, asparagus, herring, mushrooms, mussels, and beer. Maintain a healthy weight or lose weight if you are overweight. If you want to lose weight, talk with your health care provider. Do not lose weight too quickly. Start or maintain an exercise program as told by your health care provider. Eating and drinking Avoid drinking beverages that contain fructose. Drink enough fluids to keep your urine pale yellow. If you drink alcohol: Limit how much you have to: 0-1 drink a day for women who are not pregnant. 0-2 drinks a day for men. Know how much alcohol is in a drink. In the [place] one drink equals one 12 oz bottle of beer (355 mL), one 5 oz glass of wine (148 mL), or one 1 oz glass of hard liquor (44 mL). General instructions Take over-the-counter and prescription medicines only as told by your health care provider. Ask your health care provider if the medicine prescribed to you requires you to avoid driving or using machinery. Return to your normal activities as told by your health care provider. Ask your health care provider what activities are safe for you. Keep all follow-up visits. This is  important. Where to find more information [employer] [facility]: www.niams.[employer].es Contact a health care provider if you have: Another gout attack. Continuing symptoms of a gout attack after 10 days of treatment. Side effects from your medicines. Chills or a fever. Burning pain when you urinate. Pain in your lower back or abdomen. Get help right away if you: Have severe or uncontrolled pain. Cannot urinate. Summary Gout is painful swelling of the joints caused by having too much uric acid in the body. The most common site for gout to occur is in the big toe, but it can affect other joints in the body. Medicines and dietary changes can help to prevent and treat gout attacks. This information is not intended to replace advice given to you by your health care provider. Make sure you discuss any questions you have with your health care provider. Document Revised: 08/20/2021 Document Reviewed: 08/20/2021 Elsevier Patient Education  [place].

## 2022-12-25 NOTE — Assessment & Plan Note (Signed)
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

## 2022-12-28 ENCOUNTER — Encounter: Payer: Self-pay | Admitting: Internal Medicine

## 2022-12-30 ENCOUNTER — Ambulatory Visit: Payer: PPO | Attending: Internal Medicine | Admitting: Internal Medicine

## 2022-12-30 ENCOUNTER — Encounter: Payer: Self-pay | Admitting: Internal Medicine

## 2022-12-30 VITALS — BP 122/60 | HR 55 | Ht 71.0 in | Wt 185.2 lb

## 2022-12-30 DIAGNOSIS — I495 Sick sinus syndrome: Secondary | ICD-10-CM

## 2022-12-30 DIAGNOSIS — I4821 Permanent atrial fibrillation: Secondary | ICD-10-CM

## 2022-12-30 DIAGNOSIS — I5032 Chronic diastolic (congestive) heart failure: Secondary | ICD-10-CM

## 2022-12-30 DIAGNOSIS — I491 Atrial premature depolarization: Secondary | ICD-10-CM | POA: Diagnosis not present

## 2022-12-30 DIAGNOSIS — Z950 Presence of cardiac pacemaker: Secondary | ICD-10-CM

## 2022-12-30 NOTE — Progress Notes (Signed)
Patient Care Team: [name], Evie Lacks, MD as PCP - General (Internal Medicine) Deboraha Sprang, MD as PCP - Electrophysiology (Cardiology) Minus Breeding, MD as PCP - Cardiology (Cardiology) Stefanie Libel, MD (Family Medicine) Deboraha Sprang, MD (Cardiology) Jarome Matin, MD as Consulting Physician (Dermatology) Milus Banister, MD as Attending Physician (Gastroenterology) Minus Breeding, MD as Consulting Physician (Cardiology) Franchot Gallo, MD as Consulting Physician (Urology) Luberta Mutter, MD as Consulting Physician (Ophthalmology)   HPI  Russell Griffith is a 77 y.o. male for pacemaker implantation for significant pausing in the context of now permanent atrial fibrillation and previous sinus node dysfunction  Hx pulmonary vein isolation at [facility] and [facility] 2/20.  t   Coronary artery disease with bypass surgery 2000 and a Myoview November 2013 demonstrating no ischemia or scar. LV function was normal by echo February 2014 Had a history of exercise-induced polymorphic ventricular tachycardia, prompting  catheterization which demonstrated occlusion of 1 of his vein grafts but other conduits were open DATE TEST EF   2/19 LHC  55-60 % LIMA-LAD/SVG-D, SVG-PDA Patent SVG AM-OM2  12/19 Echo  60-65% LAE 41/2.0/33         Date Cr K Hgb  9/23 1.09 3.8 10.8<<6.6  1/24 1.02 3.3 12.6    Because of chronotropic incompetence, we made more aggressive his rate response.  This was associated with significant improvement.   Subsequently his device was reprogrammed AAIR to prevent inappropriate ventricular pacing as we were seen R on T.    Previously implanted device in case treatment was sinus node dysfunction.  He unfortunately underwent a battery failure to Default resulting in ventricular pacing at 74 and we elected to change out the device 10/23.  When last seen he was noted to be iron deficient anemic; following stenting x2 6/23, presented with GI bleeding from the second  portion of the duodenum treated with fulguration and argon laser and clipping.  Apparently also has gastric polyps.  Most recent hemoglobin was better  Has been seen by Dr. Daune Perch for Watchman in light of ongoing anemia issues.  He has been on iron replacement and has had discussions with Dr. [facility] who said that he would need to stay on antiplatelet therapy specifically Plavix following watchman.  He then asked the question if I have to stay on blood thinners why do the procedure with its attendant risks.  Interval bout with nausea vomiting diarrhea, question food poisoning    Atrial Fibrillation Management history:   Previous antiarrhythmic drugs: Tikosyn, Multaq Previous cardioversions: 10/14/18, 08/18/19 Previous ablations: remote at [facility], 01/25/19 CHADS2VASC score: 3 (age, HTN, CAD) Anticoagulation history: Xarelto         Past Medical History:  Diagnosis Date   Anemia    on Accrufer   Anxiety    Aortic stenosis    Mild, echo, April, 2014   Blood transfusion without reported diagnosis 2023   BPH (benign prostatic hyperplasia)    CAD (coronary artery disease)    a. s/p CABG   Carotid artery disease ([place])    Cataract 2020   bilateral sx   Colonic polyp    Diverticulosis    Elevated bilirubin    Mild chronic elevation, 2.0 January, 2011 stable   GERD (gastroesophageal reflux disease)    Barrett's esophagus-on meds   History of kidney stones    HTN (hypertension)    on meds   Hyperlipidemia    Low HDL-on meds   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HCC)    Left  lung  chest x-ray July, 2013   Persistent atrial fibrillation ([place])    a. s/p PVI at [facility]   Precancerous lesion    Forehead   Primary osteoarthritis of left knee    Mild-bilateral LE   Prolapsed internal hemorrhoids, grade 3 08/12/2015   PVC's (premature ventricular contractions)    Seasonal allergies    Tubular adenoma of colon     Past Surgical History:  Procedure Laterality Date   ABLATION     PVI at [place] 01/25/2019   Procedure: [place];  Surgeon: [name], MD;  Location: [place] CV LAB;  Service: Cardiovascular;  Laterality: N/A;   CARDIOVERSION N/A 10/14/2018   Procedure: CARDIOVERSION;  Surgeon: Fay Records, MD;  Location: [place];  Service: Cardiovascular;  Laterality: N/A;   CARDIOVERSION N/A 08/18/2019   Procedure: CARDIOVERSION;  Surgeon: Jerline Pain, MD;  Location: [facility] ENDOSCOPY;  Service: Cardiovascular;  Laterality: N/A;   COLONOSCOPY     CORONARY ARTERY BYPASS GRAFT  2000   CABG X5   CORONARY STENT INTERVENTION N/A 05/22/2022   Procedure: CORONARY STENT INTERVENTION;  Surgeon: Jettie Booze, MD;  Location: [place] CV LAB;  Service: Cardiovascular;  Laterality: N/A;   CYSTOSCOPY WITH RETROGRADE PYELOGRAM, URETEROSCOPY AND STENT PLACEMENT Bilateral 09/04/2020   Procedure: CYSTOSCOPY WITH BILATERAL RETROGRADE PYELOGRAM, RIGHT URETEROSCOPY AND RIGHT  STENT PLACEMENT;  Surgeon: Franchot Gallo, MD;  Location: [facility] ORS;  Service: Urology;  Laterality: Bilateral;   ENTEROSCOPY N/A 06/06/2022   Procedure: ENTEROSCOPY;  Surgeon: Yetta Flock, MD;  Location: [facility] ENDOSCOPY;  Service: Gastroenterology;  Laterality: N/A;   ESOPHAGOGASTRODUODENOSCOPY     HEAD & NECK SKIN LESION EXCISIONAL BIOPSY     HEMORRHOID BANDING     HEMOSTASIS CLIP PLACEMENT  06/06/2022   Procedure: HEMOSTASIS CLIP PLACEMENT;  Surgeon: Yetta Flock, MD;  Location: [facility] ENDOSCOPY;  Service: Gastroenterology;;   HOT HEMOSTASIS N/A 06/06/2022   Procedure: HOT HEMOSTASIS (ARGON PLASMA COAGULATION/BICAP);  Surgeon: Yetta Flock, MD;  Location: [facility] ENDOSCOPY;  Service: Gastroenterology;  Laterality: N/A;   INTRAVASCULAR PRESSURE WIRE/FFR STUDY N/A 01/05/2018   Procedure: INTRAVASCULAR PRESSURE WIRE/FFR STUDY;  Surgeon: Sherren Mocha, MD;  Location: [place] CV LAB;  Service: Cardiovascular;  Laterality: N/A;   INTRAVASCULAR ULTRASOUND/IVUS  N/A 05/22/2022   Procedure: Intravascular Ultrasound/IVUS;  Surgeon: Jettie Booze, MD;  Location: [place] CV LAB;  Service: Cardiovascular;  Laterality: N/A;   LEFT HEART CATH AND CORS/GRAFTS ANGIOGRAPHY N/A 01/05/2018   Procedure: LEFT HEART CATH AND CORS/GRAFTS ANGIOGRAPHY;  Surgeon: Sherren Mocha, MD;  Location: [place] CV LAB;  Service: Cardiovascular;  Laterality: N/A;   PERMANENT PACEMAKER INSERTION N/A 07/15/2012   Procedure: PERMANENT PACEMAKER INSERTION;  Surgeon: Deboraha Sprang, MD;  Location: [facility] CATH LAB;  Service: Cardiovascular;  Laterality: N/A;   PPM GENERATOR CHANGEOUT N/A 09/09/2022   Procedure: PPM GENERATOR CHANGEOUT;  Surgeon: Deboraha Sprang, MD;  Location: [place] CV LAB;  Service: Cardiovascular;  Laterality: N/A;   RIGHT/LEFT HEART CATH AND CORONARY/GRAFT ANGIOGRAPHY N/A 05/22/2022   Procedure: RIGHT/LEFT HEART CATH AND CORONARY/GRAFT ANGIOGRAPHY;  Surgeon: Jettie Booze, MD;  Location: [place] CV LAB;  Service: Cardiovascular;  Laterality: N/A;   rotator cuff surgery Right 2017   SCLEROTHERAPY  06/06/2022   Procedure: SCLEROTHERAPY;  Surgeon: Yetta Flock, MD;  Location: [facility] ENDOSCOPY;  Service: Gastroenterology;;   TONSILLECTOMY  1956   WISDOM TOOTH EXTRACTION      Current  Outpatient Medications  Medication Sig Dispense Refill   ACCRUFER 30 MG CAPS Take 1 capsule (30 mg total) by mouth 2 (two) times daily. 60 capsule 5   acetaminophen (TYLENOL) 500 MG tablet Take 1,000 mg by mouth every 6 (six) hours as needed for moderate pain, mild pain or headache.     atorvastatin (LIPITOR) 40 MG tablet Take 1 tablet (40 mg total) by mouth daily. 90 tablet 1   Carboxymethylcellulose Sodium (DRY EYE RELIEF OP) Place 1 drop into both eyes daily as needed (Dry eye).     Cholecalciferol 25 MCG (1000 UT) capsule Take 1,000 Units by mouth daily with supper.      ELIQUIS 5 MG TABS tablet TAKE ONE TABLET BY MOUTH TWICE DAILY 60 tablet 11   fosfomycin  (MONUROL) 3 g PACK SMARTSIG:3 Gram(s) By Mouth Once     furosemide (LASIX) 20 MG tablet Take 1 tablet once daily for 5 days, then take daily as needed 30 tablet 1   isosorbide mononitrate (IMDUR) 60 MG 24 hr tablet Take 1 tablet (60 mg total) by mouth daily. 90 tablet 3   losartan (COZAAR) 25 MG tablet Take 0.5 tablets (12.5 mg total) by mouth daily. 90 tablet 1   nitroGLYCERIN (NITROSTAT) 0.4 MG SL tablet Place 1 tablet (0.4 mg total) under the tongue every 5 (five) minutes as needed. 25 tablet 2   NONFORMULARY OR COMPOUNDED ITEM Apply 1 application topically daily as needed (rash). Cetaphil + triamcinolone cream     pantoprazole (PROTONIX) 40 MG tablet Take 1 tablet (40 mg total) by mouth 2 (two) times daily. (Patient taking differently: Take 40 mg by mouth daily. Pt reports he is taking daily) 180 tablet 1   potassium chloride SA (KLOR-CON M) 20 MEQ tablet Take 1 tablet (20 mEq total) by mouth daily. 5 tablet 0   predniSONE (DELTASONE) 10 MG tablet 3 tabs po qd x 3 days, then 2 tabs po qd x 3 days, then 1 tab po qd x 3 days 18 tablet 0   No current facility-administered medications for this visit.    Allergies  Allergen Reactions   Spironolactone     Cramps, dizziness   Cayenne Other (See Comments)    Sweats w/paprika too   Niacin And Related Other (See Comments)    Upset stomach   Sulfa Antibiotics Itching and Rash    "have no idea; mother told me I was allergic to"    Sulfonamide Derivatives Other (See Comments)    "have no idea; mother told me I was allergic to"    Review of Systems negative except from HPI and PMH  Physical Exam  There were no vitals taken for this visit. Well developed and well nourished in no acute distress HENT normal Neck supple with JVP-flat Clear Device pocket well healed; without hematoma or erythema.  There is no tethering  Regular rate and rhythm, no murmur gallop No  murmur Abd-soft with active BS No Clubbing cyanosis  edema Skin-warm and  dry A & Oriented  Grossly normal sensory and motor function  ECG    Device function is  normal.  Programming changes none  See Paceart for details     ECG  Atrial fib @ 63  -/10/43 ECG 5/22  Afib  ECG 9/21 AFib Assessment and  Plam  PACs  Atrial fibrillation s/p PVI [facility] and [facility] 2020 now permanent  Sinus node dysfunction  Ischemic heart disease with prior bypass surgery  Pacemaker medtronic  Pacemaker function-abnormal  resulting in R on T pacing ( multiple reprogramings)  Anemia associated with GI bleeding   HFpEF   Lengthy discussions regarding the role of watchman.  The question that he asks about GI bleeding risks with Plavix and apixaban, I went to a couple of trials and found the Plavix bleeding risk for GI bleeding to be estimated at about 1 %/year and from the Aristotle trial GI bleeding risk in the general population 0.79 %/year.  This being the case, he is disinclined to pursue watchman at this time with its attendant risks.  We agreed that we would follow his hemoglobin serially and if bleeding becomes more of an issue and perhaps there is a role, but with the above data, it can be hard to know that Plavix is here to be any better for him than apixaban.  He developed hypokalemia in response to a short dose diuretics.  He will be using as needed diuretics for edema, I would suggest that he take his potassium daily when he takes his diuretics otherwise hopefully he will not need it.  He has repeat blood work scheduled for tomorrow

## 2022-12-30 NOTE — Patient Instructions (Addendum)
Medication Instructions:  Your physician recommends that you continue on your current medications as directed. Please refer to the Current Medication list given to you today.  *If you need a refill on your cardiac medications before your next appointment, please call your pharmacy*   Lab Work: None ordered.  If you have labs (blood work) drawn today and your tests are completely normal, you will receive your results only by: [place] (if you have MyChart) OR A paper copy in the mail If you have any lab test that is abnormal or we need to change your treatment, we will call you to review the results.   Testing/Procedures: None ordered.    Follow-Up: At [facility], you and your health needs are our priority.  As part of our continuing mission to provide you with exceptional heart care, we have created designated Provider Care Teams.  These Care Teams include your primary Cardiologist (physician) and Advanced Practice Providers (APPs -  Physician Assistants and Nurse Practitioners) who all work together to provide you with the care you need, when you need it.  We recommend signing up for the patient portal called "MyChart".  Sign up information is provided on this After Visit Summary.  MyChart is used to connect with patients for Virtual Visits (Telemedicine).  Patients are able to view lab/test results, encounter notes, upcoming appointments, etc.  Non-urgent messages can be sent to your provider as well.   To learn more about what you can do with MyChart, go to [email or web address].    Your next appointment:   8 months with Dr Caryl Comes

## 2022-12-30 NOTE — Progress Notes (Signed)
Remote pacemaker transmission.   

## 2022-12-31 ENCOUNTER — Encounter: Payer: Self-pay | Admitting: Internal Medicine

## 2022-12-31 ENCOUNTER — Ambulatory Visit (INDEPENDENT_AMBULATORY_CARE_PROVIDER_SITE_OTHER): Payer: PPO | Admitting: Internal Medicine

## 2022-12-31 ENCOUNTER — Telehealth: Payer: Self-pay | Admitting: *Deleted

## 2022-12-31 VITALS — BP 122/72 | HR 68 | Temp 98.3°F | Ht 71.0 in | Wt 183.0 lb

## 2022-12-31 DIAGNOSIS — R600 Localized edema: Secondary | ICD-10-CM

## 2022-12-31 DIAGNOSIS — N390 Urinary tract infection, site not specified: Secondary | ICD-10-CM

## 2022-12-31 DIAGNOSIS — M10071 Idiopathic gout, right ankle and foot: Secondary | ICD-10-CM | POA: Diagnosis not present

## 2022-12-31 DIAGNOSIS — D500 Iron deficiency anemia secondary to blood loss (chronic): Secondary | ICD-10-CM

## 2022-12-31 LAB — URINALYSIS, ROUTINE W REFLEX MICROSCOPIC
Bilirubin Urine: NEGATIVE
Ketones, ur: NEGATIVE
Nitrite: POSITIVE — AB
Specific Gravity, Urine: 1.025 (ref 1.000–1.030)
Total Protein, Urine: NEGATIVE
Urine Glucose: NEGATIVE
Urobilinogen, UA: 0.2 (ref 0.0–1.0)
pH: 6 (ref 5.0–8.0)

## 2022-12-31 NOTE — Assessment & Plan Note (Signed)
UTI w/resistent Enterobacter aerogenes on 12/18/22 (we treated w/Fosfomyin). Ua and Cx

## 2022-12-31 NOTE — Assessment & Plan Note (Signed)
Worse Back on Accrufer

## 2022-12-31 NOTE — Telephone Encounter (Signed)
[  3:18 PM] Clodfelter, Tammy hey Russell Griffith i have Russell Griffith, Russell Griffith 06/17/1946 down here does the doct want al the labs or just the urine tests for quest and lebeaur [3:22 PM] Russell Griffith, Russell Griffith He said just the UA & culture [3:23 PM] Clodfelter, Tammy ok [3:25 PM] Clodfelter, Tammy can you put his labs back in PLEEEEEESE??? i didnt know that he only needed urine and it was released before he came in.   Enter iron, cbcw/d and comp back in epic.Marland KitchenJohny Griffith

## 2022-12-31 NOTE — Patient Instructions (Signed)
Elastic support knee highs on in AM, off at night Elevate feet

## 2022-12-31 NOTE — Assessment & Plan Note (Addendum)
R foot gout later, took Prednisone (has 2 more days). Elastic support knee highs on in AM, off at night Elevate feet

## 2022-12-31 NOTE — Assessment & Plan Note (Signed)
Post bypass Elastic support knee highs on in AM, off at night Elevate feet

## 2022-12-31 NOTE — Progress Notes (Signed)
Subjective:  Patient ID: Russell Griffith, male    DOB: 06-17-1946  Age: 77 y.o. MRN: [id number]  CC: Follow-up (Having swelling in right foot , also urinating more then usual)   HPI Russell Griffith presents for a UTI w/resistent Enterobacter aerogenes on 12/18/22 (we treated w/Fosfomyin). Ed had R foot gout later, took Prednisone (has 2 more days). C/o frequency, mild dysuria   Outpatient Medications Prior to Visit  Medication Sig Dispense Refill  . ACCRUFER 30 MG CAPS Take 1 capsule (30 mg total) by mouth 2 (two) times daily. 60 capsule 5  . acetaminophen (TYLENOL) 500 MG tablet Take 1,000 mg by mouth every 6 (six) hours as needed for moderate pain, mild pain or headache.    Russell Griffith atorvastatin (LIPITOR) 40 MG tablet Take 1 tablet (40 mg total) by mouth daily. 90 tablet 1  . Carboxymethylcellulose Sodium (DRY EYE RELIEF OP) Place 1 drop into both eyes daily as needed (Dry eye).    . Cholecalciferol 25 MCG (1000 UT) capsule Take 1,000 Units by mouth daily with supper.     Russell Griffith ELIQUIS 5 MG TABS tablet TAKE ONE TABLET BY MOUTH TWICE DAILY 60 tablet 11  . fosfomycin (MONUROL) 3 g PACK SMARTSIG:3 Gram(s) By Mouth Once    . furosemide (LASIX) 20 MG tablet Take 1 tablet once daily for 5 days, then take daily as needed 30 tablet 1  . isosorbide mononitrate (IMDUR) 60 MG 24 hr tablet Take 1 tablet (60 mg total) by mouth daily. 90 tablet 3  . losartan (COZAAR) 25 MG tablet Take 0.5 tablets (12.5 mg total) by mouth daily. 90 tablet 1  . nitroGLYCERIN (NITROSTAT) 0.4 MG SL tablet Place 1 tablet (0.4 mg total) under the tongue every 5 (five) minutes as needed. 25 tablet 2  . NONFORMULARY OR COMPOUNDED ITEM Apply 1 application topically daily as needed (rash). Cetaphil + triamcinolone cream    . pantoprazole (PROTONIX) 40 MG tablet Take 1 tablet (40 mg total) by mouth 2 (two) times daily. (Patient taking differently: Take 40 mg by mouth daily. Pt reports he is taking daily) 180 tablet 1  . potassium  chloride SA (KLOR-CON M) 20 MEQ tablet Take 1 tablet (20 mEq total) by mouth daily. 5 tablet 0  . predniSONE (DELTASONE) 10 MG tablet 3 tabs po qd x 3 days, then 2 tabs po qd x 3 days, then 1 tab po qd x 3 days 18 tablet 0   No facility-administered medications prior to visit.    ROS: Review of Systems  Constitutional:  Positive for fatigue. Negative for appetite change and unexpected weight change.  HENT:  Negative for congestion, nosebleeds, sneezing, sore throat and trouble swallowing.   Eyes:  Negative for itching and visual disturbance.  Respiratory:  Negative for cough.   Cardiovascular:  Negative for chest pain, palpitations and leg swelling.  Gastrointestinal:  Negative for abdominal distention, blood in stool, diarrhea and nausea.  Genitourinary:  Positive for frequency. Negative for hematuria.  Musculoskeletal:  Negative for back pain, gait problem, joint swelling and neck pain.  Skin:  Negative for rash.  Neurological:  Negative for dizziness, tremors, speech difficulty and weakness.  Psychiatric/Behavioral:  Negative for agitation, dysphoric mood, sleep disturbance and suicidal ideas. The patient is not nervous/anxious.     Objective:  BP 122/72 (BP Location: Right Arm, Patient Position: Sitting, Cuff Size: Normal)   Pulse 68   Temp 98.3 F (36.8 C) (Oral)   Ht 5' 11"$  (1.803 m)  Wt 183 lb (83 kg)   SpO2 98%   BMI 25.52 kg/m   BP Readings from Last 3 Encounters:  12/31/22 122/72  12/30/22 122/60  12/25/22 130/60    Wt Readings from Last 3 Encounters:  12/31/22 183 lb (83 kg)  12/30/22 185 lb 3.2 oz (84 kg)  12/25/22 183 lb (83 kg)    Physical Exam Constitutional:      General: He is not in acute distress.    Appearance: Normal appearance. He is well-developed.     Comments: NAD  Eyes:     Conjunctiva/sclera: Conjunctivae normal.     Pupils: Pupils are equal, round, and reactive to light.  Neck:     Thyroid: No thyromegaly.     Vascular: No JVD.   Cardiovascular:     Rate and Rhythm: Normal rate and regular rhythm.     Heart sounds: Normal heart sounds. No murmur heard.    No friction rub. No gallop.  Pulmonary:     Effort: Pulmonary effort is normal. No respiratory distress.     Breath sounds: Normal breath sounds. No wheezing or rales.  Chest:     Chest wall: No tenderness.  Abdominal:     General: Bowel sounds are normal. There is no distension.     Palpations: Abdomen is soft. There is no mass.     Tenderness: There is no abdominal tenderness. There is no guarding or rebound.  Musculoskeletal:        General: No tenderness. Normal range of motion.     Cervical back: Normal range of motion.  Lymphadenopathy:     Cervical: No cervical adenopathy.  Skin:    General: Skin is warm and dry.     Findings: No rash.  Neurological:     Mental Status: He is alert and oriented to person, place, and time.     Cranial Nerves: No cranial nerve deficit.     Motor: No abnormal muscle tone.     Coordination: Coordination normal.     Gait: Gait normal.     Deep Tendon Reflexes: Reflexes are normal and symmetric.  Psychiatric:        Behavior: Behavior normal.        Thought Content: Thought content normal.        Judgment: Judgment normal.    Lab Results  Component Value Date   WBC 8.8 12/22/2022   HGB 12.6 (L) 12/22/2022   HCT 36.6 (L) 12/22/2022   PLT 163.0 12/22/2022   GLUCOSE 98 12/22/2022   CHOL 105 04/14/2022   TRIG 42.0 04/14/2022   HDL 31.60 (L) 04/14/2022   LDLCALC 65 04/14/2022   ALT 27 12/22/2022   AST 32 12/22/2022   NA 134 (L) 12/22/2022   K 3.3 (L) 12/22/2022   CL 99 12/22/2022   CREATININE 1.02 12/22/2022   BUN 20 12/22/2022   CO2 27 12/22/2022   TSH 4.55 09/29/2021   PSA 0.81 09/29/2021   INR 1.5 (H) 06/06/2022   HGBA1C 5.8 07/30/2015    CT CARDIAC MORPH/PULM VEIN W/CM&W/O CA SCORE  Addendum Date: 11/09/2022   ADDENDUM REPORT: 11/09/2022 11:47 CLINICAL DATA:  Atrial fibrillation Pre Watchman  EXAM: Cardiac CT/CTA Comparison 01/19/2019 TECHNIQUE: The patient was scanned on a Siemens Force [id number] slice  scanner. FINDINGS: A 120 kV prospective scan was triggered in the ascending thoracic aorta at 140 HU's. Gantry rotation speed was 250 msecs and collimation was .6 mm. No beta blockade and no NTG was given. The 3D  data set was reconstructed for best systolic and diastolic phases along with delayed images of the LAA Images analyzed on a dedicated work station using MPR, MIP and VRT modes. The patient received 80 cc of contrast. Mild LAE Severe RAE. No PFO / ASD Pacing wires in RA/RV Normal diameter ascending aorta 3.7 cm No pericardial effusion Normal PV anatomy measurements below Broccoli type appendage No thrombus Landing zone 23.8 mm x 15.8 mm average diameter 20.4 mm with depth 21.8 mm Best working angle for delivery RAO 17 Caudal 5 degrees Normal PV anatomy measurements below RUPV: Ostium 18.6 mm  area 3.1 cm2 RLPV:  Ostium 19.7 mm  area 2.4 cm2 LUPV:  Ostium 19.6 mm area 2.3 cm2 LLPV:  Ostium 15.7 mm  area 1.6 cm2 Calcium score not performed to to prior stenting of native LCX. Scan had motion artifact and not reconstructed cephalad enough to completely evaluated grafts IMPRESSION: 1.  Mild LAE Severe RAE 2.  No ASD/PFO 3. Broccoli LAE with average Landing Zone diameter 20.4 mm Maximum diameter 23.8 mm suitable for a 27 mm Watchman FLX device 4.  Pacing wires in RA/RV 5.  No pericardial effusion 6.  Normal ascending thoracic aorta 3.7 cm 7.  Normal PV anatomy see measurements below 8. Motion artifact and reconstruction not carried out high enough to fully evaluate grafts Patent SVG to diagonal patent SVG to left sided PDA patent [place] to LAD Calcium score not performed due to stenting of the native LCX [place] Electronically Signed   By: [place] Rouge M.D.   On: 11/09/2022 11:47   Result Date: 11/09/2022 EXAM: OVER-READ INTERPRETATION  CT CHEST The following report is a limited chest CT over-read  performed by radiologist Dr. Maurine Simmering of [facility], [place] on 11/09/2022. This over-read does not include interpretation of cardiac or coronary anatomy or pathology. The coronary CTA interpretation by the cardiologist is attached. COMPARISON:  None Available. FINDINGS: Transverse-oriented bandlike artifact is present. Vascular: No significant extracardiac vascular findings. 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 Mediastinum/Nodes: No lymphadenopathy. Lungs/Pleura: Mild mosaic attenuation of the lungs. There is a small to moderate right and trace left pleural effusion, increased on the right in comparison prior CT. No suspicious pulmonary nodules within the limitation of suspected presence of pulmonary edema. Bandlike atelectasis in the lingula. Upper Abdomen: Reflux of contrast into the IVC and hepatic veins. Upper abdominal ascites along the liver and spleen. Musculoskeletal: No acute osseous abnormality. No suspicious osseous lesion. IMPRESSION: Cardiomegaly with mild pulmonary edema, small to moderate right and trace left pleural effusions. Electronically Signed: By: Maurine Simmering M.D. On: 11/09/2022 10:56    Assessment & Plan:   Problem List Items Addressed This Visit       Genitourinary   Complicated UTI (urinary tract infection)    UTI w/resistent Enterobacter aerogenes on 12/18/22 (we treated w/Fosfomyin). Ua and Cx        Other   Edema of right foot    Post bypass Elastic support knee highs on in AM, off at night Elevate feet      Relevant Orders   Comprehensive metabolic panel   Gout     R foot gout later, took Prednisone (has 2 more days). Elastic support knee highs on in AM, off at night Elevate feet      Iron deficiency anemia - Primary    Worse Back on Accrufer      Relevant Orders   Urinalysis  CULTURE, URINE COMPREHENSIVE   CBC with Differential/Platelet   Iron, TIBC and Ferritin Panel       No orders of the defined types were placed in this encounter.     Follow-up: No follow-ups on file.  Walker Kehr, MD

## 2023-01-01 ENCOUNTER — Other Ambulatory Visit: Payer: Self-pay | Admitting: Internal Medicine

## 2023-01-01 MED ORDER — SACCHAROMYCES BOULARDII 250 MG PO CAPS: 250.0000 mg | ORAL_CAPSULE | Freq: Two times a day (BID) | ORAL | 1 refills | Status: DC

## 2023-01-01 MED ORDER — CEFUROXIME AXETIL 500 MG PO TABS: 500.0000 mg | ORAL_TABLET | Freq: Two times a day (BID) | ORAL | 0 refills | Status: AC

## 2023-01-01 MED ORDER — CIPROFLOXACIN HCL 500 MG PO TABS: 500.0000 mg | ORAL_TABLET | Freq: Two times a day (BID) | ORAL | 0 refills | Status: DC

## 2023-01-04 ENCOUNTER — Encounter: Payer: Self-pay | Admitting: Internal Medicine

## 2023-01-04 LAB — CULTURE, URINE COMPREHENSIVE

## 2023-01-04 LAB — IRON,TIBC AND FERRITIN PANEL

## 2023-01-05 ENCOUNTER — Other Ambulatory Visit: Payer: Self-pay | Admitting: Internal Medicine

## 2023-01-05 DIAGNOSIS — N390 Urinary tract infection, site not specified: Secondary | ICD-10-CM

## 2023-01-06 ENCOUNTER — Ambulatory Visit: Payer: PPO

## 2023-01-10 ENCOUNTER — Encounter: Payer: Self-pay | Admitting: Internal Medicine

## 2023-01-13 ENCOUNTER — Other Ambulatory Visit (INDEPENDENT_AMBULATORY_CARE_PROVIDER_SITE_OTHER): Payer: PPO

## 2023-01-13 DIAGNOSIS — N390 Urinary tract infection, site not specified: Secondary | ICD-10-CM | POA: Diagnosis not present

## 2023-01-14 LAB — URINALYSIS, ROUTINE W REFLEX MICROSCOPIC
Bilirubin Urine: NEGATIVE
Ketones, ur: NEGATIVE
Nitrite: POSITIVE — AB
Specific Gravity, Urine: 1.025 (ref 1.000–1.030)
Total Protein, Urine: 30 — AB
Urine Glucose: NEGATIVE
Urobilinogen, UA: 0.2 (ref 0.0–1.0)
pH: 6 (ref 5.0–8.0)

## 2023-01-15 ENCOUNTER — Encounter: Payer: Self-pay | Admitting: Internal Medicine

## 2023-01-15 LAB — CULTURE, URINE COMPREHENSIVE

## 2023-01-16 ENCOUNTER — Other Ambulatory Visit: Payer: Self-pay | Admitting: Internal Medicine

## 2023-01-16 ENCOUNTER — Encounter: Payer: Self-pay | Admitting: Internal Medicine

## 2023-01-16 DIAGNOSIS — N390 Urinary tract infection, site not specified: Secondary | ICD-10-CM

## 2023-01-16 MED ORDER — CIPROFLOXACIN HCL 500 MG PO TABS: 500.0000 mg | ORAL_TABLET | Freq: Two times a day (BID) | ORAL | 0 refills | Status: AC

## 2023-01-17 ENCOUNTER — Encounter: Payer: Self-pay | Admitting: Internal Medicine

## 2023-01-18 NOTE — Telephone Encounter (Signed)
No appt on Tues.. Next appt 02/11/23.Marland KitchenJohny Chess

## 2023-01-19 ENCOUNTER — Encounter: Payer: Self-pay | Admitting: Internal Medicine

## 2023-01-19 ENCOUNTER — Ambulatory Visit: Payer: PPO | Admitting: *Deleted

## 2023-01-19 DIAGNOSIS — T887XXA Unspecified adverse effect of drug or medicament, initial encounter: Secondary | ICD-10-CM

## 2023-01-19 NOTE — Progress Notes (Addendum)
Pt states EKG need to be faxed to Dr.Klein his cardiologist. Faxed to [phone].Marland KitchenJohny Griffith  Medical screening examination/treatment/procedure(s) were performed by non-physician practitioner and as supervising physician I was immediately available for consultation/collaboration.  I agree with above. Lew Dawes, MD

## 2023-01-19 NOTE — Telephone Encounter (Signed)
Russell Griffith made nurse visit for EKG. Pt is coming at 9.Marland KitchenJohny Griffith

## 2023-01-20 DIAGNOSIS — F4320 Adjustment disorder, unspecified: Secondary | ICD-10-CM | POA: Diagnosis not present

## 2023-01-26 DIAGNOSIS — F4320 Adjustment disorder, unspecified: Secondary | ICD-10-CM | POA: Diagnosis not present

## 2023-01-27 ENCOUNTER — Encounter: Payer: Self-pay | Admitting: Internal Medicine

## 2023-02-03 ENCOUNTER — Other Ambulatory Visit (INDEPENDENT_AMBULATORY_CARE_PROVIDER_SITE_OTHER): Payer: PPO

## 2023-02-03 DIAGNOSIS — R600 Localized edema: Secondary | ICD-10-CM

## 2023-02-03 DIAGNOSIS — D500 Iron deficiency anemia secondary to blood loss (chronic): Secondary | ICD-10-CM

## 2023-02-03 DIAGNOSIS — N390 Urinary tract infection, site not specified: Secondary | ICD-10-CM | POA: Diagnosis not present

## 2023-02-03 LAB — CBC WITH DIFFERENTIAL/PLATELET
Basophils Absolute: 0 10*3/uL (ref 0.0–0.1)
Basophils Relative: 0.8 % (ref 0.0–3.0)
Eosinophils Absolute: 0.1 10*3/uL (ref 0.0–0.7)
Eosinophils Relative: 1.9 % (ref 0.0–5.0)
HCT: 34.9 % — ABNORMAL LOW (ref 39.0–52.0)
Hemoglobin: 11.8 g/dL — ABNORMAL LOW (ref 13.0–17.0)
Lymphocytes Relative: 26.5 % (ref 12.0–46.0)
Lymphs Abs: 1.1 10*3/uL (ref 0.7–4.0)
MCHC: 33.8 g/dL (ref 30.0–36.0)
MCV: 85.7 fl (ref 78.0–100.0)
Monocytes Absolute: 0.7 10*3/uL (ref 0.1–1.0)
Monocytes Relative: 17.5 % — ABNORMAL HIGH (ref 3.0–12.0)
Neutro Abs: 2.1 10*3/uL (ref 1.4–7.7)
Neutrophils Relative %: 53.3 % (ref 43.0–77.0)
Platelets: 121 10*3/uL — ABNORMAL LOW (ref 150.0–400.0)
RBC: 4.07 Mil/uL — ABNORMAL LOW (ref 4.22–5.81)
RDW: 16.7 % — ABNORMAL HIGH (ref 11.5–15.5)
WBC: 4 10*3/uL (ref 4.0–10.5)

## 2023-02-03 LAB — URINALYSIS, ROUTINE W REFLEX MICROSCOPIC
Bilirubin Urine: NEGATIVE
Hgb urine dipstick: NEGATIVE
Ketones, ur: NEGATIVE
Leukocytes,Ua: NEGATIVE
Nitrite: NEGATIVE
RBC / HPF: NONE SEEN (ref 0–?)
Specific Gravity, Urine: 1.01 (ref 1.000–1.030)
Total Protein, Urine: 30 — AB
Urine Glucose: NEGATIVE
Urobilinogen, UA: 0.2 (ref 0.0–1.0)
pH: 6 (ref 5.0–8.0)

## 2023-02-03 LAB — COMPREHENSIVE METABOLIC PANEL
ALT: 18 U/L (ref 0–53)
AST: 25 U/L (ref 0–37)
Albumin: 3.5 g/dL (ref 3.5–5.2)
Alkaline Phosphatase: 102 U/L (ref 39–117)
BUN: 17 mg/dL (ref 6–23)
CO2: 28 mEq/L (ref 19–32)
Calcium: 9.1 mg/dL (ref 8.4–10.5)
Chloride: 106 mEq/L (ref 96–112)
Creatinine, Ser: 0.9 mg/dL (ref 0.40–1.50)
GFR: 83.81 mL/min (ref >60.00)
Glucose, Bld: 150 mg/dL — ABNORMAL HIGH (ref 70–99)
Potassium: 3.5 mEq/L (ref 3.5–5.1)
Sodium: 141 mEq/L (ref 135–145)
Total Bilirubin: 3.7 mg/dL — ABNORMAL HIGH (ref 0.2–1.2)
Total Protein: 5.9 g/dL — ABNORMAL LOW (ref 6.0–8.3)

## 2023-02-05 LAB — CULTURE, URINE COMPREHENSIVE: RESULT:: NO GROWTH

## 2023-02-11 ENCOUNTER — Encounter: Payer: Self-pay | Admitting: Internal Medicine

## 2023-02-11 ENCOUNTER — Ambulatory Visit (INDEPENDENT_AMBULATORY_CARE_PROVIDER_SITE_OTHER): Payer: PPO | Admitting: Internal Medicine

## 2023-02-11 VITALS — BP 112/68 | HR 68 | Temp 97.8°F | Ht 71.0 in | Wt 189.0 lb

## 2023-02-11 DIAGNOSIS — I10 Essential (primary) hypertension: Secondary | ICD-10-CM

## 2023-02-11 DIAGNOSIS — R739 Hyperglycemia, unspecified: Secondary | ICD-10-CM | POA: Diagnosis not present

## 2023-02-11 DIAGNOSIS — R609 Edema, unspecified: Secondary | ICD-10-CM

## 2023-02-11 DIAGNOSIS — R5383 Other fatigue: Secondary | ICD-10-CM

## 2023-02-11 DIAGNOSIS — D649 Anemia, unspecified: Secondary | ICD-10-CM | POA: Diagnosis not present

## 2023-02-11 MED ORDER — FUROSEMIDE 20 MG PO TABS: ORAL_TABLET | ORAL | 3 refills | Status: DC

## 2023-02-11 MED ORDER — ACCRUFER 30 MG PO CAPS: 1.0000 | ORAL_CAPSULE | Freq: Two times a day (BID) | ORAL | 5 refills | Status: DC

## 2023-02-11 NOTE — Assessment & Plan Note (Addendum)
Ed ate out x 10 d (dtr in the hospital) - Worse - use Lasix prn; NAS diet

## 2023-02-11 NOTE — Assessment & Plan Note (Signed)
BP Readings from Last 3 Encounters:  02/11/23 112/68  12/31/22 122/72  12/30/22 122/60

## 2023-02-11 NOTE — Progress Notes (Signed)
Subjective:  Patient ID: Russell Griffith, male    DOB: 01-Apr-1946  Age: 77 y.o. 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     atorvastatin (LIPITOR) 40 MG tablet Take 1 tablet (40 mg total) by mouth daily. 90 tablet 1   Carboxymethylcellulose Sodium (DRY EYE RELIEF OP) Place 1 drop into both eyes daily as needed (Dry eye).     Cholecalciferol 25 MCG (1000 UT) capsule Take 1,000 Units by mouth daily with supper.      ELIQUIS 5 MG TABS tablet TAKE ONE TABLET BY MOUTH TWICE DAILY 60 tablet 11   isosorbide mononitrate (IMDUR) 60 MG 24 hr tablet Take 1 tablet (60 mg total) by mouth daily. 90 tablet 3   losartan (COZAAR) 25 MG tablet Take 0.5 tablets (12.5 mg total) by mouth daily. 90 tablet 1   nitroGLYCERIN (NITROSTAT) 0.4 MG SL tablet Place 1 tablet (0.4 mg total) under the tongue every 5 (five) minutes as needed. 25 tablet 2   NONFORMULARY OR COMPOUNDED ITEM Apply 1 application topically daily as needed (rash). Cetaphil + triamcinolone cream     pantoprazole (PROTONIX) 40 MG tablet Take 1 tablet (40 mg total) by mouth 2 (two) times daily. (Patient taking differently: Take 40 mg by mouth daily. Pt reports he is taking daily) 180 tablet 1   ACCRUFER 30 MG CAPS Take 1 capsule (30 mg total) by mouth 2 (two) times daily. 60 capsule 5   furosemide (LASIX) 20 MG tablet Take 1 tablet once daily for 5 days, then take daily as needed 30 tablet 1   fosfomycin (MONUROL) 3 g PACK SMARTSIG:3 Gram(s) By Mouth Once     potassium chloride SA (KLOR-CON M) 20 MEQ tablet Take 1 tablet (20 mEq total) by mouth daily. 5 tablet 0   saccharomyces boulardii (FLORASTOR) 250 MG  capsule Take 1 capsule (250 mg total) by mouth 2 (two) times daily. 60 capsule 1   No facility-administered medications prior to visit.    ROS: Review of Systems  Objective:  BP 112/68 (BP Location: Left Arm, Patient Position: Sitting, Cuff Size: Large)   Pulse 68   Temp 97.8 F (36.6 C) (Oral)   Ht '5\' 11"'$  (1.803 m)   Wt 189 lb (85.7 kg)   SpO2 96%   BMI 26.36 kg/m   BP Readings from Last 3 Encounters:  02/11/23 112/68  12/31/22 122/72  12/30/22 122/60    Wt Readings from Last 3 Encounters:  02/11/23 189 lb (85.7 kg)  12/31/22 183 lb (83 kg)  12/30/22 185 lb 3.2 oz (84 kg)    Physical Exam Constitutional:      General: He is not in acute distress.    Appearance: He is well-developed.     Comments: NAD  Eyes:     Conjunctiva/sclera: Conjunctivae normal.     Pupils: Pupils are equal, round, and reactive to light.  Neck:     Thyroid: No thyromegaly.     Vascular: No JVD.  Cardiovascular:     Rate and Rhythm: Normal rate and regular rhythm.     Heart sounds: Normal heart sounds. No murmur heard.  No friction rub. No gallop.  Pulmonary:     Effort: Pulmonary effort is normal. No respiratory distress.     Breath sounds: Normal breath sounds. No wheezing or rales.  Chest:     Chest wall: No tenderness.  Abdominal:     General: Bowel sounds are normal. There is no distension.     Palpations: Abdomen is soft. There is no mass.     Tenderness: There is no abdominal tenderness. There is no guarding or rebound.  Musculoskeletal:        General: No tenderness. Normal range of motion.     Cervical back: Normal range of motion.  Lymphadenopathy:     Cervical: No cervical adenopathy.  Skin:    General: Skin is warm and dry.     Findings: No rash.  Neurological:     Mental Status: He is alert and oriented to person, place, and time.     Cranial Nerves: No cranial nerve deficit.     Motor: No abnormal muscle tone.     Coordination: Coordination normal.     Gait:  Gait normal.     Deep Tendon Reflexes: Reflexes are normal and symmetric.  Psychiatric:        Behavior: Behavior normal.        Thought Content: Thought content normal.        Judgment: Judgment normal.   Legs w/1+ edema B  Lab Results  Component Value Date   WBC 4.0 02/03/2023   HGB 11.8 (L) 02/03/2023   HCT 34.9 (L) 02/03/2023   PLT 121.0 (L) 02/03/2023   GLUCOSE 150 (H) 02/03/2023   CHOL 105 04/14/2022   TRIG 42.0 04/14/2022   HDL 31.60 (L) 04/14/2022   LDLCALC 65 04/14/2022   ALT 18 02/03/2023   AST 25 02/03/2023   NA 141 02/03/2023   K 3.5 02/03/2023   CL 106 02/03/2023   CREATININE 0.90 02/03/2023   BUN 17 02/03/2023   CO2 28 02/03/2023   TSH 4.55 09/29/2021   PSA 0.81 09/29/2021   INR 1.5 (H) 06/06/2022   HGBA1C 5.8 07/30/2015    CT CARDIAC MORPH/PULM VEIN W/CM&W/O CA SCORE  Addendum Date: 11/09/2022   ADDENDUM REPORT: 11/09/2022 11:47 CLINICAL DATA:  Atrial fibrillation Pre Watchman EXAM: Cardiac CT/CTA Comparison 01/19/2019 TECHNIQUE: The patient was scanned on a Siemens Force [id number] slice  scanner. FINDINGS: A 120 kV prospective scan was triggered in the ascending thoracic aorta at 140 HU's. Gantry rotation speed was 250 msecs and collimation was .6 mm. No beta blockade and no NTG was given. The 3D data set was reconstructed for best systolic and diastolic phases along with delayed images of the LAA Images analyzed on a dedicated work station using MPR, MIP and VRT modes. The patient received 80 cc of contrast. Mild LAE Severe RAE. No PFO / ASD Pacing wires in RA/RV Normal diameter ascending aorta 3.7 cm No pericardial effusion Normal PV anatomy measurements below Broccoli type appendage No thrombus Landing zone 23.8 mm x 15.8 mm average diameter 20.4 mm with depth 21.8 mm Best working angle for delivery RAO 17 Caudal 5 degrees Normal PV anatomy measurements below RUPV: Ostium 18.6 mm  area 3.1 cm2 RLPV:  Ostium 19.7 mm  area 2.4 cm2 LUPV:  Ostium 19.6 mm area 2.3 cm2  LLPV:  Ostium 15.7 mm  area 1.6 cm2 Calcium score not performed to to prior stenting of native LCX. Scan had motion artifact and not reconstructed cephalad enough to completely evaluated grafts  IMPRESSION: 1.  Mild LAE Severe RAE 2.  No ASD/PFO 3. Broccoli LAE with average Landing Zone diameter 20.4 mm Maximum diameter 23.8 mm suitable for a 27 mm Watchman FLX device 4.  Pacing wires in RA/RV 5.  No pericardial effusion 6.  Normal ascending thoracic aorta 3.7 cm 7.  Normal PV anatomy see measurements below 8. Motion artifact and reconstruction not carried out high enough to fully evaluate grafts Patent SVG to diagonal patent SVG to left sided PDA patent [place] to LAD Calcium score not performed due to stenting of the native LCX Jenkins Rouge Electronically Signed   By: Jenkins Rouge M.D.   On: 11/09/2022 11:47   Result Date: 11/09/2022 EXAM: OVER-READ INTERPRETATION  CT CHEST The following report is a limited chest CT over-read performed by radiologist Dr. Maurine Simmering of [facility], [place] on 11/09/2022. This over-read does not include interpretation of cardiac or coronary anatomy or pathology. The coronary CTA interpretation by the cardiologist is attached. COMPARISON:  None Available. FINDINGS: Transverse-oriented bandlike artifact is present. Vascular: No significant extracardiac vascular findings. 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 Mediastinum/Nodes: No lymphadenopathy. Lungs/Pleura: Mild mosaic attenuation of the lungs. There is a small to moderate right and trace left pleural effusion, increased on the right in comparison prior CT. No suspicious pulmonary nodules within the limitation of suspected presence of pulmonary edema. Bandlike atelectasis in the lingula. Upper Abdomen: Reflux of contrast into the IVC and hepatic veins. Upper abdominal ascites along the liver and spleen. Musculoskeletal: No acute osseous abnormality. No suspicious  osseous lesion. IMPRESSION: Cardiomegaly with mild pulmonary edema, small to moderate right and trace left pleural effusions. Electronically Signed: By: Maurine Simmering M.D. On: 11/09/2022 10:56    Assessment & Plan:   Problem List Items Addressed This Visit       Cardiovascular and Mediastinum   HTN (hypertension)    BP Readings from Last 3 Encounters:  02/11/23 112/68  12/31/22 122/72  12/30/22 122/60        Relevant Medications   furosemide (LASIX) 20 MG tablet     Other   Fatigue - Primary   Relevant Orders   Urinalysis   Edema    Ed ate out x 10 d (dtr in the hospital) - Worse - use Lasix prn; NAS diet       Anemia    Worse Start Accrufer again Check labs      Relevant Medications   ACCRUFER 30 MG CAPS   Other Relevant Orders   Iron, TIBC and Ferritin Panel   CBC with Differential/Platelet   Other Visit Diagnoses     Hyperglycemia       Relevant Orders   Comprehensive metabolic panel   Hemoglobin A1c   Urinalysis         Meds ordered this encounter  Medications   ACCRUFER 30 MG CAPS    Sig: Take 1 capsule (30 mg total) by mouth 2 (two) times daily.    Dispense:  60 capsule    Refill:  5   furosemide (LASIX) 20 MG tablet    Sig: Take 1 tablet once daily for 5 days, then take daily as needed    Dispense:  30 tablet    Refill:  3      Follow-up: Return in about 3 months (around 05/14/2023) for a follow-up visit.  Walker Kehr, MD

## 2023-02-11 NOTE — Assessment & Plan Note (Signed)
Worse Start Accrufer again Check labs

## 2023-02-12 ENCOUNTER — Encounter: Payer: Self-pay | Admitting: Internal Medicine

## 2023-02-21 ENCOUNTER — Other Ambulatory Visit: Payer: Self-pay | Admitting: Internal Medicine

## 2023-03-11 ENCOUNTER — Other Ambulatory Visit (INDEPENDENT_AMBULATORY_CARE_PROVIDER_SITE_OTHER): Payer: PPO

## 2023-03-11 ENCOUNTER — Ambulatory Visit (INDEPENDENT_AMBULATORY_CARE_PROVIDER_SITE_OTHER): Payer: PPO

## 2023-03-11 DIAGNOSIS — I495 Sick sinus syndrome: Secondary | ICD-10-CM | POA: Diagnosis not present

## 2023-03-11 DIAGNOSIS — R5383 Other fatigue: Secondary | ICD-10-CM

## 2023-03-11 DIAGNOSIS — D649 Anemia, unspecified: Secondary | ICD-10-CM | POA: Diagnosis not present

## 2023-03-11 DIAGNOSIS — R739 Hyperglycemia, unspecified: Secondary | ICD-10-CM | POA: Diagnosis not present

## 2023-03-11 LAB — HEMOGLOBIN A1C: Hgb A1c MFr Bld: 5.7 % (ref 4.6–6.5)

## 2023-03-11 LAB — CBC WITH DIFFERENTIAL/PLATELET
Basophils Absolute: 0 10*3/uL (ref 0.0–0.1)
Basophils Relative: 0.7 % (ref 0.0–3.0)
Eosinophils Absolute: 0.1 10*3/uL (ref 0.0–0.7)
Eosinophils Relative: 1.9 % (ref 0.0–5.0)
HCT: 38.9 % — ABNORMAL LOW (ref 39.0–52.0)
Hemoglobin: 13.1 g/dL (ref 13.0–17.0)
Lymphocytes Relative: 28.7 % (ref 12.0–46.0)
Lymphs Abs: 1.3 10*3/uL (ref 0.7–4.0)
MCHC: 33.8 g/dL (ref 30.0–36.0)
MCV: 86.4 fl (ref 78.0–100.0)
Monocytes Absolute: 0.9 10*3/uL (ref 0.1–1.0)
Monocytes Relative: 20.4 % — ABNORMAL HIGH (ref 3.0–12.0)
Neutro Abs: 2.2 10*3/uL (ref 1.4–7.7)
Neutrophils Relative %: 48.3 % (ref 43.0–77.0)
Platelets: 136 10*3/uL — ABNORMAL LOW (ref 150.0–400.0)
RBC: 4.5 Mil/uL (ref 4.22–5.81)
RDW: 16.2 % — ABNORMAL HIGH (ref 11.5–15.5)
WBC: 4.5 10*3/uL (ref 4.0–10.5)

## 2023-03-11 LAB — URINALYSIS
Bilirubin Urine: NEGATIVE
Hgb urine dipstick: NEGATIVE
Ketones, ur: NEGATIVE
Leukocytes,Ua: NEGATIVE
Nitrite: NEGATIVE
Specific Gravity, Urine: 1.015 (ref 1.000–1.030)
Total Protein, Urine: NEGATIVE
Urine Glucose: NEGATIVE
Urobilinogen, UA: 1 (ref 0.0–1.0)
pH: 6.5 (ref 5.0–8.0)

## 2023-03-11 LAB — COMPREHENSIVE METABOLIC PANEL
ALT: 20 U/L (ref 0–53)
AST: 29 U/L (ref 0–37)
Albumin: 4.4 g/dL (ref 3.5–5.2)
Alkaline Phosphatase: 133 U/L — ABNORMAL HIGH (ref 39–117)
BUN: 20 mg/dL (ref 6–23)
CO2: 27 mEq/L (ref 19–32)
Calcium: 9.6 mg/dL (ref 8.4–10.5)
Chloride: 104 mEq/L (ref 96–112)
Creatinine, Ser: 1.06 mg/dL (ref 0.40–1.50)
GFR: 68.82 mL/min (ref >60.00)
Glucose, Bld: 84 mg/dL (ref 70–99)
Potassium: 4 mEq/L (ref 3.5–5.1)
Sodium: 140 mEq/L (ref 135–145)
Total Bilirubin: 4.4 mg/dL — ABNORMAL HIGH (ref 0.2–1.2)
Total Protein: 6.7 g/dL (ref 6.0–8.3)

## 2023-03-12 LAB — CUP PACEART REMOTE DEVICE CHECK
Battery Remaining Longevity: 180 mo
Battery Voltage: 3.2 V
Brady Statistic AP VP Percent: 0 %
Brady Statistic AP VS Percent: 0 %
Brady Statistic AS VP Percent: 0.56 %
Brady Statistic AS VS Percent: 99.44 %
Brady Statistic RA Percent Paced: 0 %
Brady Statistic RV Percent Paced: 0.56 %
Date Time Interrogation Session: 20240411215336
Implantable Lead Connection Status: 753985
Implantable Lead Connection Status: 753985
Implantable Lead Implant Date: 20130816
Implantable Lead Implant Date: 20130816
Implantable Lead Location: 753859
Implantable Lead Location: 753860
Implantable Lead Model: 5076
Implantable Lead Model: 5076
Implantable Pulse Generator Implant Date: 20231011
Lead Channel Impedance Value: 304 Ohm
Lead Channel Impedance Value: 456 Ohm
Lead Channel Impedance Value: 608 Ohm
Lead Channel Impedance Value: 836 Ohm
Lead Channel Pacing Threshold Amplitude: 2.5 V
Lead Channel Pacing Threshold Pulse Width: 0.4 ms
Lead Channel Sensing Intrinsic Amplitude: 0.375 mV
Lead Channel Sensing Intrinsic Amplitude: 14.625 mV
Lead Channel Sensing Intrinsic Amplitude: 14.625 mV
Lead Channel Setting Pacing Amplitude: 2.5 V
Lead Channel Setting Pacing Pulse Width: 1 ms
Lead Channel Setting Sensing Sensitivity: 0.9 mV
Zone Setting Status: 755011

## 2023-03-12 LAB — IRON,TIBC AND FERRITIN PANEL
%SAT: 18 % (calc) — ABNORMAL LOW (ref 20–48)
Ferritin: 27 ng/mL (ref 24–380)
Iron: 74 ug/dL (ref 50–180)
TIBC: 421 mcg/dL (calc) (ref 250–425)

## 2023-04-12 DIAGNOSIS — F4320 Adjustment disorder, unspecified: Secondary | ICD-10-CM | POA: Diagnosis not present

## 2023-04-13 ENCOUNTER — Ambulatory Visit (INDEPENDENT_AMBULATORY_CARE_PROVIDER_SITE_OTHER): Payer: PPO | Admitting: Internal Medicine

## 2023-04-13 ENCOUNTER — Encounter: Payer: Self-pay | Admitting: Internal Medicine

## 2023-04-13 VITALS — BP 130/74 | HR 62 | Temp 97.8°F | Ht 71.0 in | Wt 192.0 lb

## 2023-04-13 DIAGNOSIS — R1013 Epigastric pain: Secondary | ICD-10-CM

## 2023-04-13 DIAGNOSIS — R0602 Shortness of breath: Secondary | ICD-10-CM | POA: Diagnosis not present

## 2023-04-13 DIAGNOSIS — K922 Gastrointestinal hemorrhage, unspecified: Secondary | ICD-10-CM | POA: Diagnosis not present

## 2023-04-13 DIAGNOSIS — R21 Rash and other nonspecific skin eruption: Secondary | ICD-10-CM | POA: Diagnosis not present

## 2023-04-13 MED ORDER — LORAZEPAM 0.5 MG PO TABS: 0.5000 mg | ORAL_TABLET | Freq: Two times a day (BID) | ORAL | 1 refills | Status: DC | PRN

## 2023-04-13 MED ORDER — METRONIDAZOLE 500 MG PO TABS
500.0000 mg | ORAL_TABLET | Freq: Three times a day (TID) | ORAL | 1 refills | Status: DC
Start: 2023-04-13 — End: 2023-06-02

## 2023-04-13 MED ORDER — HYOSCYAMINE SULFATE 0.125 MG PO TABS: 0.1250 mg | ORAL_TABLET | ORAL | 1 refills | Status: DC | PRN

## 2023-04-13 MED ORDER — TRIAMCINOLONE ACETONIDE 0.5 % EX CREA: 1.0000 | TOPICAL_CREAM | Freq: Three times a day (TID) | CUTANEOUS | 1 refills | Status: AC

## 2023-04-13 NOTE — Assessment & Plan Note (Addendum)
No relapse clinically Monitor CBC

## 2023-04-13 NOTE — Progress Notes (Signed)
Subjective:  Patient ID: Russell Griffith, male    DOB: [birth year]  Age: 77 y.o. MRN: [id number]  CC: Abdominal Pain (Stomach pain on and off for a long time but gotten worse in last couple months. Having frequent bowel movements) and Shortness of Breath (Last couple months, has appt with cardiologst next week)   HPI Russell Griffith presents for abdominal pain, loose BMs x 3-4/d C/o stomach pain on and off for a long time but gotten worse in last couple months. Having frequent bowel movements and Shortness of Breath (Last couple months, has appt with cardiologst next week)  Outpatient Medications Prior to Visit  Medication Sig Dispense Refill   ACCRUFER 30 MG CAPS Take 1 capsule (30 mg total) by mouth 2 (two) times daily. (Patient not taking: Reported on 04/20/2023) 60 capsule 5   acetaminophen (TYLENOL) 500 MG tablet Take 1,000 mg by mouth every 6 (six) hours as needed for moderate pain, mild pain or headache.     atorvastatin (LIPITOR) 40 MG tablet Take 1 tablet (40 mg total) by mouth daily. 90 tablet 1   Carboxymethylcellulose Sodium (DRY EYE RELIEF OP) Place 1 drop into both eyes daily as needed (Dry eye).     Cholecalciferol 25 MCG (1000 UT) capsule Take 1,000 Units by mouth daily with supper.      ELIQUIS 5 MG TABS tablet TAKE ONE TABLET BY MOUTH TWICE DAILY 60 tablet 11   furosemide (LASIX) 20 MG tablet Take 1 tablet once daily for 5 days, then take daily as needed 30 tablet 3   isosorbide mononitrate (IMDUR) 60 MG 24 hr tablet Take 1 tablet (60 mg total) by mouth daily. 90 tablet 3   losartan (COZAAR) 25 MG tablet TAKE ONE TABLET BY MOUTH DAILY 90 tablet 3   nitroGLYCERIN (NITROSTAT) 0.4 MG SL tablet Place 1 tablet (0.4 mg total) under the tongue every 5 (five) minutes as needed. 25 tablet 2   NONFORMULARY OR COMPOUNDED ITEM Apply 1 application topically daily as needed (rash). Cetaphil + triamcinolone cream     pantoprazole (PROTONIX) 40 MG tablet Take 1 tablet (40 mg total)  by mouth 2 (two) times daily. (Patient taking differently: Take 40 mg by mouth daily. Pt reports he is taking daily) 180 tablet 1   No facility-administered medications prior to visit.    ROS: Review of Systems  Constitutional:  Negative for appetite change, fatigue and unexpected weight change.  HENT:  Negative for congestion, nosebleeds, sneezing, sore throat and trouble swallowing.   Eyes:  Negative for itching and visual disturbance.  Respiratory:  Positive for shortness of breath. Negative for cough.   Cardiovascular:  Negative for chest pain, palpitations and leg swelling.  Gastrointestinal:  Positive for abdominal pain and diarrhea. Negative for abdominal distention, blood in stool and nausea.  Genitourinary:  Negative for frequency and hematuria.  Musculoskeletal:  Negative for back pain, gait problem, joint swelling and neck pain.  Skin:  Negative for rash.  Neurological:  Negative for dizziness, tremors, speech difficulty and weakness.  Psychiatric/Behavioral:  Negative for agitation, dysphoric mood and sleep disturbance. The patient is not nervous/anxious.     Objective:  BP 130/74 (BP Location: Left Arm, Patient Position: Sitting, Cuff Size: Large)   Pulse 62   Temp 97.8 F (36.6 C) (Temporal)   Ht 5\' 11"  (1.803 m)   Wt 192 lb (87.1 kg)   SpO2 97%   BMI 26.78 kg/m   BP Readings from Last 3 Encounters:  04/20/23  120/80  04/13/23 130/74  02/11/23 112/68    Wt Readings from Last 3 Encounters:  04/20/23 188 lb 6.4 oz (85.5 kg)  04/13/23 192 lb (87.1 kg)  02/11/23 189 lb (85.7 kg)    Physical Exam Constitutional:      General: He is not in acute distress.    Appearance: He is well-developed.     Comments: NAD  Eyes:     Conjunctiva/sclera: Conjunctivae normal.     Pupils: Pupils are equal, round, and reactive to light.  Neck:     Thyroid: No thyromegaly.     Vascular: No JVD.  Cardiovascular:     Rate and Rhythm: Normal rate and regular rhythm.     Heart  sounds: Normal heart sounds. No murmur heard.    No friction rub. No gallop.  Pulmonary:     Effort: Pulmonary effort is normal. No respiratory distress.     Breath sounds: Normal breath sounds. No wheezing or rales.  Chest:     Chest wall: No tenderness.  Abdominal:     General: Bowel sounds are normal. There is no distension.     Palpations: Abdomen is soft. There is no mass.     Tenderness: There is no abdominal tenderness. There is no guarding or rebound.  Musculoskeletal:        General: No tenderness. Normal range of motion.     Cervical back: Normal range of motion.  Lymphadenopathy:     Cervical: No cervical adenopathy.  Skin:    General: Skin is warm and dry.     Findings: No rash.  Neurological:     Mental Status: He is alert and oriented to person, place, and time.     Cranial Nerves: No cranial nerve deficit.     Motor: No abnormal muscle tone.     Coordination: Coordination normal.     Gait: Gait normal.     Deep Tendon Reflexes: Reflexes are normal and symmetric.  Psychiatric:        Behavior: Behavior normal.        Thought Content: Thought content normal.        Judgment: Judgment normal.     Lab Results  Component Value Date   WBC 4.3 04/14/2023   HGB 13.1 04/14/2023   HCT 38.8 (L) 04/14/2023   PLT 148.0 (L) 04/14/2023   GLUCOSE 81 04/14/2023   CHOL 105 04/14/2022   TRIG 42.0 04/14/2022   HDL 31.60 (L) 04/14/2022   LDLCALC 65 04/14/2022   ALT 19 04/14/2023   AST 27 04/14/2023   NA 142 04/14/2023   K 3.7 04/14/2023   CL 106 04/14/2023   CREATININE 1.07 04/14/2023   BUN 18 04/14/2023   CO2 28 04/14/2023   TSH 6.04 (H) 04/14/2023   PSA 0.81 09/29/2021   INR 1.5 (H) 06/06/2022   HGBA1C 5.7 03/11/2023    CT CARDIAC MORPH/PULM VEIN W/CM&W/O CA SCORE  Addendum Date: 11/09/2022   ADDENDUM REPORT: 11/09/2022 11:47 CLINICAL DATA:  Atrial fibrillation Pre Watchman EXAM: Cardiac CT/CTA Comparison 01/19/2019 TECHNIQUE: The patient was scanned on a  Siemens Force 192 slice  scanner. FINDINGS: A 120 kV prospective scan was triggered in the ascending thoracic aorta at 140 HU's. Gantry rotation speed was 250 msecs and collimation was .6 mm. No beta blockade and no NTG was given. The 3D data set was reconstructed for best systolic and diastolic phases along with delayed images of the LAA Images analyzed on a dedicated work station using MPR,  MIP and VRT modes. The patient received 80 cc of contrast. Mild LAE Severe RAE. No PFO / ASD Pacing wires in RA/RV Normal diameter ascending aorta 3.7 cm No pericardial effusion Normal PV anatomy measurements below Broccoli type appendage No thrombus Landing zone 23.8 mm x 15.8 mm average diameter 20.4 mm with depth 21.8 mm Best working angle for delivery RAO 17 Caudal 5 degrees Normal PV anatomy measurements below RUPV: Ostium 18.6 mm  area 3.1 cm2 RLPV:  Ostium 19.7 mm  area 2.4 cm2 LUPV:  Ostium 19.6 mm area 2.3 cm2 LLPV:  Ostium 15.7 mm  area 1.6 cm2 Calcium score not performed to to prior stenting of native LCX. Scan had motion artifact and not reconstructed cephalad enough to completely evaluated grafts IMPRESSION: 1.  Mild LAE Severe RAE 2.  No ASD/PFO 3. Broccoli LAE with average Landing Zone diameter 20.4 mm Maximum diameter 23.8 mm suitable for a 27 mm Watchman FLX device 4.  Pacing wires in RA/RV 5.  No pericardial effusion 6.  Normal ascending thoracic aorta 3.7 cm 7.  Normal PV anatomy see measurements below 8. Motion artifact and reconstruction not carried out high enough to fully evaluate grafts Patent SVG to diagonal patent SVG to left sided PDA patent [place] to LAD Calcium score not performed due to stenting of the native LCX Charlton Haws Electronically Signed   By: Charlton Haws M.D.   On: 11/09/2022 11:47   Result Date: 11/09/2022 EXAM: OVER-READ INTERPRETATION  CT CHEST The following report is a limited chest CT over-read performed by radiologist Dr. Caprice Renshaw of [facility] on 11/09/2022.  This over-read does not include interpretation of cardiac or coronary anatomy or pathology. The coronary CTA interpretation by the cardiologist is attached. COMPARISON:  None Available. FINDINGS: Transverse-oriented bandlike artifact is present. Vascular: No significant extracardiac vascular findings. 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 Mediastinum/Nodes: No lymphadenopathy. Lungs/Pleura: Mild mosaic attenuation of the lungs. There is a small to moderate right and trace left pleural effusion, increased on the right in comparison prior CT. No suspicious pulmonary nodules within the limitation of suspected presence of pulmonary edema. Bandlike atelectasis in the lingula. Upper Abdomen: Reflux of contrast into the IVC and hepatic veins. Upper abdominal ascites along the liver and spleen. Musculoskeletal: No acute osseous abnormality. No suspicious osseous lesion. IMPRESSION: Cardiomegaly with mild pulmonary edema, small to moderate right and trace left pleural effusions. Electronically Signed: By: Caprice Renshaw M.D. On: 11/09/2022 10:56    Assessment & Plan:   Problem List Items Addressed This Visit     Shortness of breath    Dyspnea could be related to abdominal bloating which is postprandial.-- Ed has appt with cardiologst next week) Will try empiric Flagyl to reduce gas Hyoscyamine as needed for spasms      Relevant Orders   CBC with Differential/Platelet (Completed)   Comprehensive metabolic panel (Completed)   TSH (Completed)   Urinalysis   Lipase (Completed)   Epigastric pain    Recurrent abdominal pain, loose BMs x 3-4/d, bloating C/o stomach pain on and off for a long time but gotten worse in last couple months. Having frequent bowel movements and Shortness of Breath (Last couple months, has appt with cardiologst next week) Will try empiric Flagyl to reduce gas Hyoscyamine as needed for spasms      Relevant Orders   CBC  with Differential/Platelet (Completed)   Comprehensive metabolic panel (Completed)   TSH (Completed)  Urinalysis   Lipase (Completed)   Upper GI bleed - Primary    No relapse clinically Monitor CBC       Relevant Orders   CBC with Differential/Platelet (Completed)   Comprehensive metabolic panel (Completed)   TSH (Completed)   Urinalysis   Lipase (Completed)      Meds ordered this encounter  Medications   hyoscyamine (LEVSIN) 0.125 MG tablet    Sig: Take 1-2 tablets (0.125-0.25 mg total) by mouth every 4 (four) hours as needed for cramping.    Dispense:  120 tablet    Refill:  1   metroNIDAZOLE (FLAGYL) 500 MG tablet    Sig: Take 1 tablet (500 mg total) by mouth 3 (three) times daily.    Dispense:  21 tablet    Refill:  1   LORazepam (ATIVAN) 0.5 MG tablet    Sig: Take 1 tablet (0.5 mg total) by mouth 2 (two) times daily as needed for anxiety or sleep.    Dispense:  60 tablet    Refill:  1   triamcinolone cream (KENALOG) 0.5 %    Sig: Apply 1 Application topically 3 (three) times daily.    Dispense:  30 g    Refill:  1      Follow-up: Return in about 6 weeks (around 05/25/2023) for a follow-up visit.  Sonda Primes, MD

## 2023-04-14 LAB — CBC WITH DIFFERENTIAL/PLATELET
Basophils Absolute: 0 10*3/uL (ref 0.0–0.1)
Basophils Relative: 0.7 % (ref 0.0–3.0)
Eosinophils Absolute: 0.1 10*3/uL (ref 0.0–0.7)
Eosinophils Relative: 2.3 % (ref 0.0–5.0)
HCT: 38.8 % — ABNORMAL LOW (ref 39.0–52.0)
Hemoglobin: 13.1 g/dL (ref 13.0–17.0)
Lymphocytes Relative: 26.2 % (ref 12.0–46.0)
Lymphs Abs: 1.1 10*3/uL (ref 0.7–4.0)
MCHC: 33.7 g/dL (ref 30.0–36.0)
MCV: 87.6 fl (ref 78.0–100.0)
Monocytes Absolute: 0.8 10*3/uL (ref 0.1–1.0)
Monocytes Relative: 18 % — ABNORMAL HIGH (ref 3.0–12.0)
Neutro Abs: 2.3 10*3/uL (ref 1.4–7.7)
Neutrophils Relative %: 52.5 % (ref 43.0–77.0)
Platelets: 148 10*3/uL — ABNORMAL LOW (ref 150.0–400.0)
RBC: 4.43 Mil/uL (ref 4.22–5.81)
RDW: 15.3 % (ref 11.5–15.5)
WBC: 4.3 10*3/uL (ref 4.0–10.5)

## 2023-04-14 LAB — URINALYSIS, ROUTINE W REFLEX MICROSCOPIC
Bilirubin Urine: NEGATIVE
Hgb urine dipstick: NEGATIVE
Ketones, ur: NEGATIVE
Nitrite: NEGATIVE
Specific Gravity, Urine: 1.01 (ref 1.000–1.030)
Total Protein, Urine: NEGATIVE
Urine Glucose: NEGATIVE
Urobilinogen, UA: 1 (ref 0.0–1.0)
pH: 6 (ref 5.0–8.0)

## 2023-04-14 LAB — COMPREHENSIVE METABOLIC PANEL
ALT: 19 U/L (ref 0–53)
AST: 27 U/L (ref 0–37)
Albumin: 4 g/dL (ref 3.5–5.2)
Alkaline Phosphatase: 127 U/L — ABNORMAL HIGH (ref 39–117)
BUN: 18 mg/dL (ref 6–23)
CO2: 28 mEq/L (ref 19–32)
Calcium: 9.4 mg/dL (ref 8.4–10.5)
Chloride: 106 mEq/L (ref 96–112)
Creatinine, Ser: 1.07 mg/dL (ref 0.40–1.50)
GFR: 68.01 mL/min (ref >60.00)
Glucose, Bld: 81 mg/dL (ref 70–99)
Potassium: 3.7 mEq/L (ref 3.5–5.1)
Sodium: 142 mEq/L (ref 135–145)
Total Bilirubin: 4.1 mg/dL — ABNORMAL HIGH (ref 0.2–1.2)
Total Protein: 6.4 g/dL (ref 6.0–8.3)

## 2023-04-14 LAB — TSH: TSH: 6.04 u[IU]/mL — ABNORMAL HIGH (ref 0.35–5.50)

## 2023-04-14 LAB — LIPASE: Lipase: 33 U/L (ref 11.0–59.0)

## 2023-04-16 NOTE — Progress Notes (Signed)
Remote pacemaker transmission.   

## 2023-04-18 ENCOUNTER — Other Ambulatory Visit: Payer: Self-pay | Admitting: Internal Medicine

## 2023-04-18 NOTE — Progress Notes (Unsigned)
Cardiology Office Note:   Date:  04/18/2023  ID:  Virgina Griffith, DOB 01/22/1946, MRN [id number]  History of Present Illness:   Russell Griffith is a 77 y.o. male who presents for evaluation of known CAD.    He has a history of coronary disease with his last catheterization demonstrated disease as below.  He had stenting of his native circ and an SVG to the circ.  He has atrial fib He has had 2 ablations 1 at [facility] and a second by Dr. Johney Frame but has persistent atrial fibrillation.  He has been previously on Tikosyn.  He did not tolerate Multaq.  He has had GI bleeding.  He is now status post Watchman.    ***  ***  In July he had anemia with hemoglobin of 6.6 and positive Hemoccult.  His dual antiplatelet therapy and apixaban were held.  GI was consulted.  He received 2 units of PRBCs and underwent urgent endoscopy on 06/06/2022.  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  He received fulguration via argon plasma as well as hemostasis clips which stopped bleeding.  His hemoglobin dropped back down to 7.8 from the mid 8 range the previous day after restarting Plavix.  He received another unit of PRBCs.  He saw GI on the 16th .     He is now being scheduled for a Watchman.  Since he was last seen he has done well.  His hemoglobin is risen.  He is exercising.  He denies any new cardiovascular symptoms.  He is not having any palpitations, presyncope or syncope.  He is not having any chest pressure, neck or arm discomfort.  The shortness of breath that was his predominant symptom seems to have resolved with stenting of his vein graft.    ROS: ***  Studies Reviewed:    EKG:  ***  ***  Risk Assessment/Calculations:    CHA2DS2-VASc Score = 3   This indicates a 3.2% annual risk of stroke. The patient's score is based upon: CHF History: 0 HTN History: 1 Diabetes History: 0 Stroke History: 0 Vascular Disease History: 1 Age Score:  1 Gender Score: 0    No BP recorded.  {Refresh Note OR Click here to enter BP  :1}***        Physical Exam:   VS:  There were no vitals taken for this visit.   Wt Readings from Last 3 Encounters:  04/13/23 192 lb (87.1 kg)  02/11/23 189 lb (85.7 kg)  12/31/22 183 lb (83 kg)     GEN: Well nourished, well developed in no acute distress NECK: No JVD; No carotid bruits CARDIAC: ***RRR, no murmurs, rubs, gallops RESPIRATORY:  Clear to auscultation without rales, wheezing or rhonchi  ABDOMEN: Soft, non-tender, non-distended EXTREMITIES:  No edema; No deformity   ASSESSMENT AND PLAN:     ATRIAL FIB:   CHADS VASC score is 3.  ***  He is going to have a Watchman.  We are discussing the timing of this and how to handle his anticoagulation around this as he is also having a gastric polyp resected on the fourth of next month.   PACEMAKER PLACEMENT:  Sinus node dysfunction.   He had normal device function on interrogation in April.  ***  He is up-to-date with follow-up.  He is status post generator placement last month.     CAD:   ***  The patient has no new sypmtoms.  No further cardiovascular testing is indicated.  We will continue with aggressive risk reduction and meds as listed.   DYSPNEA:  He has no SOB.  ***  No further work up.    AS:  This was mild to moderate in August 2023.  ***    DYSLIPIDEMIA: LDL was *** 65 with an HDL of 32.  No change in therapy.  CAROTID STENOSIS:  tHIS WAS 40 - 59% on the left in 2019.  He needs follow up Doppler.  ***   {Are you ordering a CV Procedure (e.g. stress test, cath, DCCV, TEE, etc)?   Press F2        :[id number]}   Signed, Rollene Rotunda, MD

## 2023-04-20 ENCOUNTER — Ambulatory Visit: Payer: PPO | Attending: Cardiology | Admitting: Cardiology

## 2023-04-20 ENCOUNTER — Encounter: Payer: Self-pay | Admitting: Cardiology

## 2023-04-20 VITALS — BP 120/80 | HR 50 | Ht 71.0 in | Wt 188.4 lb

## 2023-04-20 DIAGNOSIS — Z950 Presence of cardiac pacemaker: Secondary | ICD-10-CM

## 2023-04-20 DIAGNOSIS — I25810 Atherosclerosis of coronary artery bypass graft(s) without angina pectoris: Secondary | ICD-10-CM | POA: Diagnosis not present

## 2023-04-20 DIAGNOSIS — I6529 Occlusion and stenosis of unspecified carotid artery: Secondary | ICD-10-CM

## 2023-04-20 DIAGNOSIS — I350 Nonrheumatic aortic (valve) stenosis: Secondary | ICD-10-CM | POA: Diagnosis not present

## 2023-04-20 DIAGNOSIS — I4821 Permanent atrial fibrillation: Secondary | ICD-10-CM

## 2023-04-20 DIAGNOSIS — E785 Hyperlipidemia, unspecified: Secondary | ICD-10-CM | POA: Diagnosis not present

## 2023-04-20 NOTE — Patient Instructions (Addendum)
Medication Instructions:  No Changes In Medications at this time.   *If you need a refill on your cardiac medications before your next appointment, please call your pharmacy*  Lab Work: Please return for FASTING Blood Work AT [facility]. No appointment needed, lab here at the office is open Monday-Friday from 8AM to 4PM and closed daily for lunch from 12:45-1:45.   If you have labs (blood work) drawn today and your tests are completely normal, you will receive your results only by: MyChart Message (if you have MyChart) OR A paper copy in the mail If you have any lab test that is abnormal or we need to change your treatment, we will call you to review the results.  Testing/Procedures:  Your physician has requested that you have a carotid duplex. This test is an ultrasound of the carotid arteries in your neck. It looks at blood flow through these arteries that supply the brain with blood. Allow one hour for this exam. There are no restrictions or special instructions.   How to Prepare for Your Cardiac PET/CT Stress Test:  1. Please do not take these medications before your test:   Medications that may interfere with the cardiac pharmacological stress agent (ex. nitrates - including erectile dysfunction medications, isosorbide mononitrate, tamulosin or beta-blockers) the day of the exam. (Erectile dysfunction medication should be held for at least 72 hrs prior to test) Theophylline containing medications for 12 hours. Dipyridamole 48 hours prior to the test. Your remaining medications may be taken with water.  2. Nothing to eat or drink, except water, 3 hours prior to arrival time.   NO caffeine/decaffeinated products, or chocolate 12 hours prior to arrival.  3. NO perfume, cologne or lotion  4. Total time is 1 to 2 hours; you may want to bring reading material for the waiting time.  5. Please report to Radiology at the [facility] Main Entrance 30 minutes early for your  test.  [address] [place], [place] [address]  Diabetic Preparation:  Hold oral medications. You may take NPH and Lantus insulin. Do not take Humalog or Humulin R (Regular Insulin) the day of your test. Check blood sugars prior to leaving the house. If able to eat breakfast prior to 3 hour fasting, you may take all medications, including your insulin, Do not worry if you miss your breakfast dose of insulin - start at your next meal.  IF YOU THINK YOU MAY BE PREGNANT, OR ARE NURSING PLEASE INFORM THE TECHNOLOGIST.  In preparation for your appointment, medication and supplies will be purchased.  Appointment availability is limited, so if you need to cancel or reschedule, please call the Radiology Department at [phone]  24 hours in advance to avoid a cancellation fee of $100.00  What to Expect After you Arrive:  Once you arrive and check in for your appointment, you will be taken to a preparation room within the Radiology Department.  A technologist or Nurse will obtain your medical history, verify that you are correctly prepped for the exam, and explain the procedure.  Afterwards,  an IV will be started in your arm and electrodes will be placed on your skin for EKG monitoring during the stress portion of the exam. Then you will be escorted to the PET/CT scanner.  There, staff will get you positioned on the scanner and obtain a blood pressure and EKG.  During the exam, you will continue to be connected to the EKG and blood pressure machines.  A  small, safe amount of a radioactive tracer will be injected in your IV to obtain a series of pictures of your heart along with an injection of a stress agent.    After your Exam:  It is recommended that you eat a meal and drink a caffeinated beverage to counter act any effects of the stress agent.  Drink plenty of fluids for the remainder of the day and urinate frequently for the first couple of hours after the exam.  Your doctor will inform you  of your test results within 7-10 business days.  For questions about your test or how to prepare for your test, please call: Rockwell Alexandria, Cardiac Imaging Nurse Navigator  Larey Brick, Cardiac Imaging Nurse Navigator Office: [phone]  Follow-Up: At [facility], you and your health needs are our priority.  As part of our continuing mission to provide you with exceptional heart care, we have created designated Provider Care Teams.  These Care Teams include your primary Cardiologist (physician) and Advanced Practice Providers (APPs -  Physician Assistants and Nurse Practitioners) who all work together to provide you with the care you need, when you need it.  Your next appointment:   6 month(s)  Provider:   Rollene Rotunda, MD

## 2023-04-21 DIAGNOSIS — R21 Rash and other nonspecific skin eruption: Secondary | ICD-10-CM | POA: Insufficient documentation

## 2023-04-21 NOTE — Assessment & Plan Note (Signed)
New on face.  We will use triamcinolone cream

## 2023-04-21 NOTE — Assessment & Plan Note (Signed)
Dyspnea could be related to abdominal bloating which is postprandial.-- Ed has appt with cardiologst next week) Will try empiric Flagyl to reduce gas Hyoscyamine as needed for spasms

## 2023-04-21 NOTE — Assessment & Plan Note (Signed)
Recurrent abdominal pain, loose BMs x 3-4/d, bloating C/o stomach pain on and off for a long time but gotten worse in last couple months. Having frequent bowel movements and Shortness of Breath (Last couple months, has appt with cardiologst next week) Will try empiric Flagyl to reduce gas Hyoscyamine as needed for spasms

## 2023-04-22 DIAGNOSIS — I25810 Atherosclerosis of coronary artery bypass graft(s) without angina pectoris: Secondary | ICD-10-CM | POA: Diagnosis not present

## 2023-04-22 DIAGNOSIS — E785 Hyperlipidemia, unspecified: Secondary | ICD-10-CM | POA: Diagnosis not present

## 2023-04-23 LAB — LIPID PANEL
Chol/HDL Ratio: 2.7 ratio (ref 0.0–5.0)
Cholesterol, Total: 83 mg/dL — ABNORMAL LOW (ref 100–199)
HDL: 31 mg/dL — ABNORMAL LOW (ref >39)
LDL Chol Calc (NIH): 39 mg/dL (ref 0–99)
Triglycerides: 52 mg/dL (ref 0–149)
VLDL Cholesterol Cal: 13 mg/dL (ref 5–40)

## 2023-04-29 ENCOUNTER — Encounter: Payer: Self-pay | Admitting: Cardiology

## 2023-05-02 ENCOUNTER — Other Ambulatory Visit: Payer: Self-pay | Admitting: Cardiology

## 2023-05-03 ENCOUNTER — Ambulatory Visit (HOSPITAL_COMMUNITY)
Admission: RE | Admit: 2023-05-03 | Discharge: 2023-05-03 | Disposition: A | Discharge status: Home / Self Care | Payer: PPO | Source: Ambulatory Visit | Attending: Cardiology | Admitting: Cardiology

## 2023-05-03 DIAGNOSIS — I6529 Occlusion and stenosis of unspecified carotid artery: Secondary | ICD-10-CM | POA: Diagnosis not present

## 2023-05-03 DIAGNOSIS — F4320 Adjustment disorder, unspecified: Secondary | ICD-10-CM | POA: Diagnosis not present

## 2023-05-22 ENCOUNTER — Other Ambulatory Visit: Payer: Self-pay | Admitting: Cardiology

## 2023-05-31 ENCOUNTER — Encounter: Payer: Self-pay | Admitting: Gastroenterology

## 2023-05-31 ENCOUNTER — Encounter: Payer: Self-pay | Admitting: Cardiology

## 2023-06-02 ENCOUNTER — Ambulatory Visit (INDEPENDENT_AMBULATORY_CARE_PROVIDER_SITE_OTHER): Payer: PPO

## 2023-06-02 VITALS — Ht 71.0 in | Wt 185.0 lb

## 2023-06-02 DIAGNOSIS — Z0000 Encounter for general adult medical examination without abnormal findings: Secondary | ICD-10-CM | POA: Diagnosis not present

## 2023-06-02 NOTE — Progress Notes (Addendum)
Subjective:   Russell Griffith is a 77 y.o. male who presents for Medicare Annual/Subsequent preventive examination.  Visit Complete: Virtual  I connected with  Russell Griffith on 06/02/23 by a audio enabled telemedicine application and verified that I am speaking with the correct person using two identifiers.  Patient Location: Home  Provider Location: Office/Clinic  I discussed the limitations of evaluation and management by telemedicine. The patient expressed understanding and agreed to proceed.   Review of Systems     Cardiac Risk Factors include: advanced age (>103men, >23 women);dyslipidemia;family history of premature cardiovascular disease;hypertension;male gender     Objective:    Today's Vitals   06/02/23 1459  Weight: 185 lb (83.9 kg)  Height: 5\' 11"  (1.803 m)  PainSc: 5   PainLoc: Abdomen   Body mass index is 25.8 kg/m.     06/02/2023    3:04 PM 09/09/2022   11:16 AM 06/07/2022    6:00 PM 06/05/2022    2:42 PM 05/22/2022    6:17 AM 01/28/2022    2:07 PM 01/05/2022    1:09 PM  Advanced Directives  Does Patient Have a Medical Advance Directive? Yes Yes Yes Yes Yes No Yes  Type of Estate agent of [place];Living will Healthcare Power of [place];Living will Healthcare Power of [employer] of [place];Living will Healthcare Power of [place];Living will  Living will;Healthcare Power of Attorney  Does patient want to make changes to medical advance directive?  No - Patient declined No - Patient declined    No - Patient declined  Copy of Healthcare Power of Attorney in Chart? No - copy requested  No - copy requested    No - copy requested    Current Medications (verified) Outpatient Encounter Medications as of 06/02/2023  Medication Sig   acetaminophen (TYLENOL) 500 MG tablet Take 1,000 mg by mouth every 6 (six) hours as needed for moderate pain, mild pain or headache.   atorvastatin (LIPITOR) 40 MG tablet Take 1 tablet (40 mg  total) by mouth daily.   Carboxymethylcellulose Sodium (DRY EYE RELIEF OP) Place 1 drop into both eyes daily as needed (Dry eye).   Cholecalciferol 25 MCG (1000 UT) capsule Take 1,000 Units by mouth daily with supper.    ELIQUIS 5 MG TABS tablet TAKE ONE TABLET BY MOUTH TWICE DAILY   furosemide (LASIX) 20 MG tablet Take 1 tablet once daily for 5 days, then take daily as needed   hyoscyamine (LEVSIN) 0.125 MG tablet Take 1-2 tablets (0.125-0.25 mg total) by mouth every 4 (four) hours as needed for cramping.   isosorbide mononitrate (IMDUR) 60 MG 24 hr tablet Take 1 tablet (60 mg total) by mouth daily.   losartan (COZAAR) 25 MG tablet TAKE ONE TABLET BY MOUTH DAILY   nitroGLYCERIN (NITROSTAT) 0.4 MG SL tablet Place 1 tablet (0.4 mg total) under the tongue every 5 (five) minutes as needed.   NONFORMULARY OR COMPOUNDED ITEM Apply 1 application topically daily as needed (rash). Cetaphil + triamcinolone cream   pantoprazole (PROTONIX) 40 MG tablet Take 1 tablet (40 mg total) by mouth 2 (two) times daily. (Patient taking differently: Take 40 mg by mouth daily.)   triamcinolone cream (KENALOG) 0.5 % Apply 1 Application topically 3 (three) times daily.   [DISCONTINUED] ACCRUFER 30 MG CAPS Take 1 capsule (30 mg total) by mouth 2 (two) times daily. (Patient not taking: Reported on 04/20/2023)   [DISCONTINUED] LORazepam (ATIVAN) 0.5 MG tablet Take 1 tablet (0.5 mg total) by  mouth 2 (two) times daily as needed for anxiety or sleep. (Patient not taking: Reported on 04/20/2023)   [DISCONTINUED] metroNIDAZOLE (FLAGYL) 500 MG tablet Take 1 tablet (500 mg total) by mouth 3 (three) times daily.   No facility-administered encounter medications on file as of 06/02/2023.    Allergies (verified) Spironolactone, Cayenne, Niacin and related, Sulfa antibiotics, and Sulfonamide derivatives   History: Past Medical History:  Diagnosis Date   Anemia    on Accrufer   Anxiety    Aortic stenosis    Mild, echo, April, 2014    Blood transfusion without reported diagnosis 2023   BPH (benign prostatic hyperplasia)    CAD (coronary artery disease)    a. s/p CABG   Carotid artery disease (HCC)    Cataract 2020   bilateral sx   Colonic polyp    Diverticulosis    Elevated bilirubin    Mild chronic elevation, 2.0 January, 2011 stable   GERD (gastroesophageal reflux disease)    Barrett's esophagus-on meds   History of kidney stones    HTN (hypertension)    on meds   Hyperlipidemia    Low HDL-on meds   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HCC)    Left  lung chest x-ray July, 2013   Persistent atrial fibrillation (HCC)    a. s/p PVI at [facility]   Precancerous lesion    Forehead   Primary osteoarthritis of left knee    Mild-bilateral LE   Prolapsed internal hemorrhoids, grade 3 08/12/2015   PVC's (premature ventricular contractions)    Seasonal allergies    Tubular adenoma of colon    Past Surgical History:  Procedure Laterality Date   ABLATION     [facility] at [facility]   ATRIAL FIBRILLATION ABLATION N/A 01/25/2019   Procedure: ATRIAL FIBRILLATION ABLATION;  Surgeon: Hillis Range, MD;  Location: [facility] INVASIVE CV LAB;  Service: Cardiovascular;  Laterality: N/A;   CARDIOVERSION N/A 10/14/2018   Procedure: CARDIOVERSION;  Surgeon: Pricilla Riffle, MD;  Location: [facility] ENDOSCOPY;  Service: Cardiovascular;  Laterality: N/A;   CARDIOVERSION N/A 08/18/2019   Procedure: CARDIOVERSION;  Surgeon: [name] Bathe, MD;  Location: [facility] ENDOSCOPY;  Service: Cardiovascular;  Laterality: N/A;   COLONOSCOPY     CORONARY ARTERY BYPASS GRAFT  2000   CABG X5   CORONARY PRESSURE/FFR STUDY N/A 01/05/2018   Procedure: INTRAVASCULAR PRESSURE WIRE/FFR STUDY;  Surgeon: Tonny Bollman, MD;  Location: [facility] INVASIVE CV LAB;  Service: Cardiovascular;  Laterality: N/A;   CORONARY STENT INTERVENTION N/A 05/22/2022   Procedure: CORONARY STENT INTERVENTION;  Surgeon: Corky Crafts, MD;  Location: [facility] INVASIVE CV LAB;  Service: Cardiovascular;  Laterality: N/A;   CORONARY  ULTRASOUND/IVUS N/A 05/22/2022   Procedure: Intravascular Ultrasound/IVUS;  Surgeon: Corky Crafts, MD;  Location: [facility] INVASIVE CV LAB;  Service: Cardiovascular;  Laterality: N/A;   CYSTOSCOPY WITH RETROGRADE PYELOGRAM, URETEROSCOPY AND STENT PLACEMENT Bilateral 09/04/2020   Procedure: CYSTOSCOPY WITH BILATERAL RETROGRADE PYELOGRAM, RIGHT URETEROSCOPY AND RIGHT  STENT PLACEMENT;  Surgeon: Marcine Matar, MD;  Location: [facility] ORS;  Service: Urology;  Laterality: Bilateral;   ENTEROSCOPY N/A 06/06/2022   Procedure: ENTEROSCOPY;  Surgeon: Benancio Deeds, MD;  Location: [facility] ENDOSCOPY;  Service: Gastroenterology;  Laterality: N/A;   ESOPHAGOGASTRODUODENOSCOPY     HEAD & NECK SKIN LESION EXCISIONAL BIOPSY     HEMORRHOID BANDING     HEMOSTASIS CLIP PLACEMENT  06/06/2022   Procedure: HEMOSTASIS CLIP PLACEMENT;  Surgeon: Benancio Deeds, MD;  Location: [facility] ENDOSCOPY;  Service: Gastroenterology;;   HOT  HEMOSTASIS N/A 06/06/2022   Procedure: HOT HEMOSTASIS (ARGON PLASMA COAGULATION/BICAP);  Surgeon: Benancio Deeds, MD;  Location: [facility] ENDOSCOPY;  Service: Gastroenterology;  Laterality: N/A;   LEFT HEART CATH AND CORS/GRAFTS ANGIOGRAPHY N/A 01/05/2018   Procedure: LEFT HEART CATH AND CORS/GRAFTS ANGIOGRAPHY;  Surgeon: Tonny Bollman, MD;  Location: [facility] INVASIVE CV LAB;  Service: Cardiovascular;  Laterality: N/A;   PERMANENT PACEMAKER INSERTION N/A 07/15/2012   Procedure: PERMANENT PACEMAKER INSERTION;  Surgeon: Duke Salvia, MD;  Location: [facility] CATH LAB;  Service: Cardiovascular;  Laterality: N/A;   PPM GENERATOR CHANGEOUT N/A 09/09/2022   Procedure: PPM GENERATOR CHANGEOUT;  Surgeon: Duke Salvia, MD;  Location: [facility] INVASIVE CV LAB;  Service: Cardiovascular;  Laterality: N/A;   RIGHT/LEFT HEART CATH AND CORONARY/GRAFT ANGIOGRAPHY N/A 05/22/2022   Procedure: RIGHT/LEFT HEART CATH AND CORONARY/GRAFT ANGIOGRAPHY;  Surgeon: Corky Crafts, MD;  Location: [facility] INVASIVE CV LAB;  Service:  Cardiovascular;  Laterality: N/A;   rotator cuff surgery Right 2017   SCLEROTHERAPY  06/06/2022   Procedure: SCLEROTHERAPY;  Surgeon: Benancio Deeds, MD;  Location: [facility] ENDOSCOPY;  Service: Gastroenterology;;   TONSILLECTOMY  1956   WISDOM TOOTH EXTRACTION     Family History  Problem Relation Age of Onset   Multiple myeloma Mother    Diabetes Father    Renal Disease Father    Coronary artery disease Other 36   Diabetes Other    Hyperlipidemia Other    Hypertension Other    Colon cancer Neg Hx    Esophageal cancer Neg Hx    Stomach cancer Neg Hx    Rectal cancer Neg Hx    Colon polyps Neg Hx    Social History   Socioeconomic History   Marital status: Married    Spouse name: Not on file   Number of children: 2   Years of education: 18   Highest education level: Master's degree (e.g., MA, MS, MEng, MEd, MSW, MBA)  Occupational History   Occupation: Sales promotion account executive: [employer]    Comment: Control and instrumentation engineer foundation  Tobacco Use   Smoking status: Never   Smokeless tobacco: Never  Building services engineer Use: Never used  Substance and Sexual Activity   Alcohol use: Not Currently    Alcohol/week: 0.0 - 5.0 standard drinks of alcohol    Comment: 1-2 glasses wine   Drug use: No   Sexual activity: Yes    Partners: Female  Other Topics Concern   Not on file  Social History Narrative   [employer], [place].  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  2 daughters-'75, '79; 1 grandchild; step-daughter and step grandson.  Regular exercise-yes, runs 1.5-2 mi 4x/wk, also eliptical      Patient signed a Designated Party Release to allow his wife, Macklen Wilhoite, to have access to his medical records/ information.   Social Determinants of Health   Financial Resource Strain: Low Risk  (06/02/2023)   Overall Financial Resource Strain (CARDIA)    Difficulty of Paying Living Expenses:  Not hard at all  Food Insecurity: No Food Insecurity (06/02/2023)   Hunger Vital Sign    Worried About Running Out of Food in the Last Year: Never true    Ran Out of Food in the Last Year: Never true  Transportation Needs: No Transportation Needs (06/02/2023)   PRAPARE - Transportation    Lack of Transportation (Medical): No    Lack of  Transportation (Non-Medical): No  Physical Activity: Insufficiently Active (06/02/2023)   Exercise Vital Sign    Days of Exercise per Week: 4 days    Minutes of Exercise per Session: 30 min  Stress: Stress Concern Present (06/02/2023)   [employer] of Occupational Health - Occupational Stress Questionnaire    Feeling of Stress : To some extent  Social Connections: Moderately Integrated (06/02/2023)   Social Connection and Isolation Panel [NHANES]    Frequency of Communication with Friends and Family: More than three times a week    Frequency of Social Gatherings with Friends and Family: More than three times a week    Attends Religious Services: Never    Database administrator or Organizations: Yes    Attends Engineer, structural: More than 4 times per year    Marital Status: Married    Tobacco Counseling Counseling given: Not Answered   Clinical Intake:  Pre-visit preparation completed: Yes  Pain : 0-10 Pain Score: 5  Pain Type: Chronic pain, Acute pain Pain Location: Abdomen     BMI - recorded: 25.8 Nutritional Status: BMI 25 -29 Overweight Nutritional Risks: None Diabetes: No  How often do you need to have someone help you when you read instructions, pamphlets, or other written materials from your doctor or pharmacy?: 1 - Never What is the last grade level you completed in school?: MBA from [employer]  Interpreter Needed?: No  Information entered by :: Susie Cassette, LPN.   Activities of Daily Living    06/02/2023    3:08 PM 06/01/2023   12:59 PM  In your present state of health, do you have any difficulty performing the  following activities:  Hearing? 0 0  Vision? 0 0  Difficulty concentrating or making decisions? 0 0  Walking or climbing stairs? 0 0  Dressing or bathing? 0 0  Doing errands, shopping? 0 0  Preparing Food and eating ? N N  Using the Toilet? N N  In the past six months, have you accidently leaked urine? N N  Do you have problems with loss of bowel control? N N  Managing your Medications? N N  Managing your Finances? N N  Housekeeping or managing your Housekeeping? N N    Patient Care Team: [name], Georgina Quint, MD as PCP - General (Internal Medicine) Duke Salvia, MD as PCP - Electrophysiology (Cardiology) Rollene Rotunda, MD as PCP - Cardiology (Cardiology) Enid Baas, MD (Family Medicine) Duke Salvia, MD (Cardiology) Donzetta Starch, MD as Consulting Physician (Dermatology) Rachael Fee, MD as Attending Physician (Gastroenterology) Rollene Rotunda, MD as Consulting Physician (Cardiology) Marcine Matar, MD as Consulting Physician (Urology) Maris Berger, MD as Consulting Physician (Ophthalmology)  Indicate any recent Medical Services you may have received from other than [facility] providers in the past year (date may be approximate).     Assessment:   This is a routine wellness examination for Daryus.  Hearing/Vision screen Hearing Screening - Comments:: Denies hearing difficulties; no hearing aids.   Vision Screening - Comments:: Wears otc glasses/cataracts removed - up to date with routine eye exams with Maris Berger, MD.   Dietary issues and exercise activities discussed:     Goals Addressed             This Visit's Progress    My healthcare goal is to maintain my health by going to gastroenterology for check up on abdominal pain.        Depression Screen    06/02/2023  3:05 PM 02/11/2023   10:58 AM 12/31/2022    2:24 PM 12/25/2022    8:02 AM 12/18/2022    8:23 AM 06/12/2022    8:02 AM 01/05/2022    1:13 PM  PHQ 2/9 Scores  PHQ - 2 Score 0 0 0  0 0 0 0  PHQ- 9 Score 0 0 0 3 6      Fall Risk    06/02/2023    3:05 PM 06/01/2023   12:59 PM 02/11/2023   10:58 AM 12/31/2022    2:24 PM 12/25/2022    8:02 AM  Fall Risk   Falls in the past year? 0 0 0 0 0  Number falls in past yr: 0  0 0 0  Injury with Fall? 0  0 0 0  Risk for fall due to : No Fall Risks  No Fall Risks No Fall Risks No Fall Risks  Follow up Falls prevention discussed  Falls evaluation completed Falls evaluation completed Falls evaluation completed    MEDICARE RISK AT HOME:  Medicare Risk at Home - 06/02/23 1507     Any stairs in or around the home? Yes    If so, are there any without handrails? No    Home free of loose throw rugs in walkways, pet beds, electrical cords, etc? Yes    Adequate lighting in your home to reduce risk of falls? Yes    Life alert? No    Use of a cane, walker or w/c? No    Grab bars in the bathroom? No    Shower chair or bench in shower? Yes    Elevated toilet seat or a handicapped toilet? No             TIMED UP AND GO:  Was the test performed?  No    Cognitive Function:        06/02/2023    3:08 PM  6CIT Screen  What Year? 0 points  What month? 0 points  What time? 0 points  Count back from 20 0 points  Months in reverse 0 points  Repeat phrase 0 points  Total Score 0 points    Immunizations Immunization History  Administered Date(s) Administered   COVID-19, mRNA, vaccine(Comirnaty)12 years and older 09/21/2022   Fluad Quad(high Dose 65+) 08/23/2019, 09/19/2020   Influenza Whole 08/31/2009   Influenza, High Dose Seasonal PF 08/12/2017, 09/23/2021, 09/21/2022   Influenza,inj,Quad PF,6+ Mos 08/06/2016   Influenza-Unspecified 08/24/2014, 08/30/2015   PFIZER Comirnaty(Gray Top)Covid-19 Tri-Sucrose Vaccine 04/24/2021   PFIZER(Purple Top)SARS-COV-2 Vaccination 12/17/2019, 01/07/2020   Pfizer Covid-19 Vaccine Bivalent Booster 56yrs & up 09/23/2021   Pneumococcal Conjugate-13 09/13/2013   Pneumococcal Polysaccharide-23  03/28/2009, 07/23/2015   Td 03/28/2009   Tdap 08/23/2019   Zoster Recombinant(Shingrix) 03/08/2018, 06/17/2018   Zoster, Live 09/13/2013    TDAP status: Up to date  Flu Vaccine status: Up to date  Pneumococcal vaccine status: Up to date  Covid-19 vaccine status: Completed vaccines  Qualifies for Shingles Vaccine? Yes   Zostavax completed Yes   Shingrix Completed?: Yes  Screening Tests Health Maintenance  Topic Date Due   COVID-19 Vaccine (6 - 2023-24 season) 11/16/2022   INFLUENZA VACCINE  07/01/2023   Medicare Annual Wellness (AWV)  06/01/2024   Colonoscopy  06/26/2027   DTaP/Tdap/Td (3 - Td or Tdap) 08/22/2029   Pneumonia Vaccine 56+ Years old  Completed   Hepatitis C Screening  Completed   Zoster Vaccines- Shingrix  Completed   HPV VACCINES  Aged Out  Health Maintenance  Health Maintenance Due  Topic Date Due   COVID-19 Vaccine (6 - 2023-24 season) 11/16/2022    Colorectal cancer screening: Type of screening: Colonoscopy. Completed 06/25/2020. Repeat every 7 years  Lung Cancer Screening: (Low Dose CT Chest recommended if Age 35-80 years, 20 pack-year currently smoking OR have quit w/in 15years.) does not qualify.   Lung Cancer Screening Referral: no  Additional Screening:  Hepatitis C Screening: does qualify; Completed 08/06/2016  Vision Screening: Recommended annual ophthalmology exams for early detection of glaucoma and other disorders of the eye. Is the patient up to date with their annual eye exam?  Yes  Who is the provider or what is the name of the office in which the patient attends annual eye exams? Maris Berger, MD. If pt is not established with a provider, would they like to be referred to a provider to establish care? No .   Dental Screening: Recommended annual dental exams for proper oral hygiene  Diabetic Foot Exam: N/A  Community Resource Referral / Chronic Care Management: CRR required this visit?  No   CCM required this visit?   No     Plan:     I have personally reviewed and noted the following in the patient's chart:   Medical and social history Use of alcohol, tobacco or illicit drugs  Current medications and supplements including opioid prescriptions. Patient is not currently taking opioid prescriptions. Functional ability and status Nutritional status Physical activity Advanced directives List of other physicians Hospitalizations, surgeries, and ER visits in previous 12 months Vitals Screenings to include cognitive, depression, and falls Referrals and appointments  In addition, I have reviewed and discussed with patient certain preventive protocols, quality metrics, and best practice recommendations. A written personalized care plan for preventive services as well as general preventive health recommendations were provided to patient.     Mickeal Needy, LPN   12/31/3084   After Visit Summary: (Mail) Due to this being a telephonic visit, the after visit summary with patients personalized plan was offered to patient via mail   Nurse Notes: Normal cognitive status assessed by direct observation via telephone conversation by this Nurse Health Advisor. No abnormalities found.  Medical screening examination/treatment/procedure(s) were performed by non-physician practitioner and as supervising physician I was immediately available for consultation/collaboration.  I agree with above. Jacinta Shoe, MD

## 2023-06-02 NOTE — Patient Instructions (Addendum)
Russell Griffith , Thank you for taking time to come for your Medicare Wellness Visit. I appreciate your ongoing commitment to your health goals. Please review the following plan we discussed and let me know if I can assist you in the future.   These are the goals we discussed:  Goals      My healthcare goal is to maintain my health by going to gastroenterology for check up on abdominal pain.        This is a list of the screening recommended for you and due dates:  Health Maintenance  Topic Date Due   COVID-19 Vaccine (6 - 2023-24 season) 11/16/2022   Flu Shot  07/01/2023   Medicare Annual Wellness Visit  06/01/2024   Colon Cancer Screening  06/26/2027   DTaP/Tdap/Td vaccine (3 - Td or Tdap) 08/22/2029   Pneumonia Vaccine  Completed   Hepatitis C Screening  Completed   Zoster (Shingles) Vaccine  Completed   HPV Vaccine  Aged Out    Advanced directives: Yes  Conditions/risks identified: Yes  Next appointment: It was nice speaking with you today!  Please follow up in one year for your annual wellness visit via telephone call with Nurse Percell Miller on 06/06/2024 at 2:00 p.m.  If you need to cancel or reschedule please call [phone].   Preventive Care 67 Years and Older, Male  Preventive care refers to lifestyle choices and visits with your health care provider that can promote health and wellness. What does preventive care include? A yearly physical exam. This is also called an annual well check. Dental exams once or twice a year. Routine eye exams. Ask your health care provider how often you should have your eyes checked. Personal lifestyle choices, including: Daily care of your teeth and gums. Regular physical activity. Eating a healthy diet. Avoiding tobacco and drug use. Limiting alcohol use. Practicing safe sex. Taking low doses of aspirin every day. Taking vitamin and mineral supplements as recommended by your health care provider. What happens during an annual well  check? The services and screenings done by your health care provider during your annual well check will depend on your age, overall health, lifestyle risk factors, and family history of disease. Counseling  Your health care provider may ask you questions about your: Alcohol use. Tobacco use. Drug use. Emotional well-being. Home and relationship well-being. Sexual activity. Eating habits. History of falls. Memory and ability to understand (cognition). Work and work Astronomer. Screening  You may have the following tests or measurements: Height, weight, and BMI. Blood pressure. Lipid and cholesterol levels. These may be checked every 5 years, or more frequently if you are over 7 years old. Skin check. Lung cancer screening. You may have this screening every year starting at age 23 if you have a 30-pack-year history of smoking and currently smoke or have quit within the past 15 years. Fecal occult blood test (FOBT) of the stool. You may have this test every year starting at age 32. Flexible sigmoidoscopy or colonoscopy. You may have a sigmoidoscopy every 5 years or a colonoscopy every 10 years starting at age 61. Prostate cancer screening. Recommendations will vary depending on your family history and other risks. Hepatitis C blood test. Hepatitis B blood test. Sexually transmitted disease (STD) testing. Diabetes screening. This is done by checking your blood sugar (glucose) after you have not eaten for a while (fasting). You may have this done every 1-3 years. Abdominal aortic aneurysm (AAA) screening. You may need this if you  are a current or former smoker. Osteoporosis. You may be screened starting at age 23 if you are at high risk. Talk with your health care provider about your test results, treatment options, and if necessary, the need for more tests. Vaccines  Your health care provider may recommend certain vaccines, such as: Influenza vaccine. This is recommended every  year. Tetanus, diphtheria, and acellular pertussis (Tdap, Td) vaccine. You may need a Td booster every 10 years. Zoster vaccine. You may need this after age 11. Pneumococcal 13-valent conjugate (PCV13) vaccine. One dose is recommended after age 7. Pneumococcal polysaccharide (PPSV23) vaccine. One dose is recommended after age 89. Talk to your health care provider about which screenings and vaccines you need and how often you need them. This information is not intended to replace advice given to you by your health care provider. Make sure you discuss any questions you have with your health care provider. Document Released: 12/13/2015 Document Revised: 08/05/2016 Document Reviewed: 09/17/2015 Elsevier Interactive Patient Education  2017 [place] Prevention in the Home Falls can cause injuries. They can happen to people of all ages. There are many things you can do to make your home safe and to help prevent falls. What can I do on the outside of my home? Regularly fix the edges of walkways and driveways and fix any cracks. Remove anything that might make you trip as you walk through a door, such as a raised step or threshold. Trim any bushes or trees on the path to your home. Use bright outdoor lighting. Clear any walking paths of anything that might make someone trip, such as rocks or tools. Regularly check to see if handrails are loose or broken. Make sure that both sides of any steps have handrails. Any raised decks and porches should have guardrails on the edges. Have any leaves, snow, or ice cleared regularly. Use sand or salt on walking paths during winter. Clean up any spills in your garage right away. This includes oil or grease spills. What can I do in the bathroom? Use night lights. Install grab bars by the toilet and in the tub and shower. Do not use towel bars as grab bars. Use non-skid mats or decals in the tub or shower. If you need to sit down in the shower, use a  plastic, non-slip stool. Keep the floor dry. Clean up any water that spills on the floor as soon as it happens. Remove soap buildup in the tub or shower regularly. Attach bath mats securely with double-sided non-slip rug tape. Do not have throw rugs and other things on the floor that can make you trip. What can I do in the bedroom? Use night lights. Make sure that you have a light by your bed that is easy to reach. Do not use any sheets or blankets that are too big for your bed. They should not hang down onto the floor. Have a firm chair that has side arms. You can use this for support while you get dressed. Do not have throw rugs and other things on the floor that can make you trip. What can I do in the [place]? Clean up any spills right away. Avoid walking on wet floors. Keep items that you use a lot in easy-to-reach places. If you need to reach something above you, use a strong step stool that has a grab bar. Keep electrical cords out of the way. Do not use floor polish or wax that makes floors slippery. If you  must use wax, use non-skid floor wax. Do not have throw rugs and other things on the floor that can make you trip. What can I do with my stairs? Do not leave any items on the stairs. Make sure that there are handrails on both sides of the stairs and use them. Fix handrails that are broken or loose. Make sure that handrails are as long as the stairways. Check any carpeting to make sure that it is firmly attached to the stairs. Fix any carpet that is loose or worn. Avoid having throw rugs at the top or bottom of the stairs. If you do have throw rugs, attach them to the floor with carpet tape. Make sure that you have a light switch at the top of the stairs and the bottom of the stairs. If you do not have them, ask someone to add them for you. What else can I do to help prevent falls? Wear shoes that: Do not have high heels. Have rubber bottoms. Are comfortable and fit you  well. Are closed at the toe. Do not wear sandals. If you use a stepladder: Make sure that it is fully opened. Do not climb a closed stepladder. Make sure that both sides of the stepladder are locked into place. Ask someone to hold it for you, if possible. Clearly mark and make sure that you can see: Any grab bars or handrails. First and last steps. Where the edge of each step is. Use tools that help you move around (mobility aids) if they are needed. These include: Canes. Walkers. Scooters. Crutches. Turn on the lights when you go into a dark area. Replace any light bulbs as soon as they burn out. Set up your furniture so you have a clear path. Avoid moving your furniture around. If any of your floors are uneven, fix them. If there are any pets around you, be aware of where they are. Review your medicines with your doctor. Some medicines can make you feel dizzy. This can increase your chance of falling. Ask your doctor what other things that you can do to help prevent falls. This information is not intended to replace advice given to you by your health care provider. Make sure you discuss any questions you have with your health care provider. Document Released: 09/12/2009 Document Revised: 04/23/2016 Document Reviewed: 12/21/2014 Elsevier Interactive Patient Education  2017 [employer].

## 2023-06-08 ENCOUNTER — Encounter: Payer: Self-pay | Admitting: Gastroenterology

## 2023-06-08 ENCOUNTER — Ambulatory Visit: Payer: PPO | Admitting: Gastroenterology

## 2023-06-08 VITALS — BP 138/56 | HR 54 | Ht 71.0 in | Wt 184.5 lb

## 2023-06-08 DIAGNOSIS — R1013 Epigastric pain: Secondary | ICD-10-CM | POA: Diagnosis not present

## 2023-06-08 DIAGNOSIS — G8929 Other chronic pain: Secondary | ICD-10-CM | POA: Diagnosis not present

## 2023-06-08 DIAGNOSIS — R140 Abdominal distension (gaseous): Secondary | ICD-10-CM

## 2023-06-08 NOTE — Progress Notes (Unsigned)
06/08/2023 Russell Griffith [id number] 22-Dec-1945   HISTORY OF PRESENT ILLNESS: This is a 77 year old male who is a patient of Dr. Lanetta Inch.  He has past medical history including significant cardiac history remarkable for CAD status post CABG x 5, persistent atrial fibrillation on Eliquis, sinus node dysfunction with pacemaker, aortic stenosis and history of GERD on chronic pantoprazole.  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  Subsequently had another follow-up EGD in December 2023 with results as follows:  EGD 10/2022: - Z-line irregular, did not meet criteria for Barrett's. - 1 cm hiatal hernia. - Normal esophagus otherwise - Two inflamed gastric polyps. Resected and retrieved as outlined. Clips were placed prophylactically. Rule out adenomas - Multiple benign fundic gland polyps - Normal stomach otherwise - Normal examined duodenum.  Surg path:  Hyperplastic polyps with no H. pylori  Colonoscopy July 2021 showed a single 1 mm polyp that was removed (tubular adenoma), moderate diverticulosis, and internal and external hemorrhoids.  He is here today with complaints of upper abdominal discomfort.  He says that it is not sharp pain, more just discomfort.  Says this has been on and off for years, but more so now over the past 2 to 3 months.  Feels full and bloated as well.  Notices it more with movement and jostling around.  Actually feels better sometimes after eating.  Has taken Pepto-Bismol with immediate relief of symptoms.  Also gets relief of symptoms with taking Lasix.  Tried Flagyl from his PCP and that did not help.  Also tried Levsin/hyoscyamine from his PCP and that did not help either.  Has very mild nausea with this at times, but no vomiting.  Does not really feel like he has heartburn or reflux.  He is on his pantoprazole once daily.  Past Medical History:  Diagnosis Date   Anemia    on Accrufer   Anxiety     Aortic stenosis    Mild, echo, April, 2014   Blood transfusion without reported diagnosis 2023   BPH (benign prostatic hyperplasia)    CAD (coronary artery disease)    a. s/p CABG   Carotid artery disease (HCC)    Cataract 2020   bilateral sx   Colonic polyp    Diverticulosis    Elevated bilirubin    Mild chronic elevation, 2.0 January, 2011 stable   GERD (gastroesophageal reflux disease)    Barrett's esophagus-on meds   History of kidney stones    HTN (hypertension)    on meds   Hyperlipidemia    Low HDL-on meds   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HCC)    Left  lung chest x-ray July, 2013   Persistent atrial fibrillation (HCC)    a. s/p PVI at [facility]   Precancerous lesion    Forehead   Primary osteoarthritis of left knee    Mild-bilateral LE   Prolapsed internal hemorrhoids, grade 3 08/12/2015   PVC's (premature ventricular contractions)    Seasonal allergies    Tubular adenoma of colon    Past Surgical History:  Procedure Laterality Date   ABLATION     PVI at [facility]   ATRIAL FIBRILLATION ABLATION N/A 01/25/2019   Procedure: ATRIAL FIBRILLATION ABLATION;  Surgeon: Hillis Range, MD;  Location: [facility] INVASIVE CV LAB;  Service: Cardiovascular;  Laterality: N/A;   CARDIOVERSION N/A 10/14/2018   Procedure: CARDIOVERSION;  Surgeon: Pricilla Riffle, MD;  Location: [facility] ENDOSCOPY;  Service: Cardiovascular;  Laterality: N/A;   CARDIOVERSION N/A 08/18/2019   Procedure: CARDIOVERSION;  Surgeon: [name] Bathe, MD;  Location: [facility] ENDOSCOPY;  Service: Cardiovascular;  Laterality: N/A;   COLONOSCOPY     CORONARY ARTERY BYPASS GRAFT  2000   CABG X5   CORONARY PRESSURE/FFR STUDY N/A 01/05/2018   Procedure: INTRAVASCULAR PRESSURE WIRE/FFR STUDY;  Surgeon: Tonny Bollman, MD;  Location: [facility] INVASIVE CV LAB;  Service: Cardiovascular;  Laterality: N/A;   CORONARY STENT INTERVENTION N/A 05/22/2022   Procedure: CORONARY STENT INTERVENTION;  Surgeon: Corky Crafts, MD;  Location: [facility] INVASIVE CV LAB;  Service:  Cardiovascular;  Laterality: N/A;   CORONARY ULTRASOUND/IVUS N/A 05/22/2022   Procedure: Intravascular Ultrasound/IVUS;  Surgeon: Corky Crafts, MD;  Location: [facility] INVASIVE CV LAB;  Service: Cardiovascular;  Laterality: N/A;   CYSTOSCOPY WITH RETROGRADE PYELOGRAM, URETEROSCOPY AND STENT PLACEMENT Bilateral 09/04/2020   Procedure: CYSTOSCOPY WITH BILATERAL RETROGRADE PYELOGRAM, RIGHT URETEROSCOPY AND RIGHT  STENT PLACEMENT;  Surgeon: Marcine Matar, MD;  Location: [facility] ORS;  Service: Urology;  Laterality: Bilateral;   ENTEROSCOPY N/A 06/06/2022   Procedure: ENTEROSCOPY;  Surgeon: Benancio Deeds, MD;  Location: [facility] ENDOSCOPY;  Service: Gastroenterology;  Laterality: N/A;   ESOPHAGOGASTRODUODENOSCOPY     HEAD & NECK SKIN LESION EXCISIONAL BIOPSY     HEMORRHOID BANDING     HEMOSTASIS CLIP PLACEMENT  06/06/2022   Procedure: HEMOSTASIS CLIP PLACEMENT;  Surgeon: Benancio Deeds, MD;  Location: [facility] ENDOSCOPY;  Service: Gastroenterology;;   HOT HEMOSTASIS N/A 06/06/2022   Procedure: HOT HEMOSTASIS (ARGON PLASMA COAGULATION/BICAP);  Surgeon: Benancio Deeds, MD;  Location: [facility] ENDOSCOPY;  Service: Gastroenterology;  Laterality: N/A;   LEFT HEART CATH AND CORS/GRAFTS ANGIOGRAPHY N/A 01/05/2018   Procedure: LEFT HEART CATH AND CORS/GRAFTS ANGIOGRAPHY;  Surgeon: Tonny Bollman, MD;  Location: [facility] INVASIVE CV LAB;  Service: Cardiovascular;  Laterality: N/A;   PERMANENT PACEMAKER INSERTION N/A 07/15/2012   Procedure: PERMANENT PACEMAKER INSERTION;  Surgeon: Duke Salvia, MD;  Location: [facility] CATH LAB;  Service: Cardiovascular;  Laterality: N/A;   PPM GENERATOR CHANGEOUT N/A 09/09/2022   Procedure: PPM GENERATOR CHANGEOUT;  Surgeon: Duke Salvia, MD;  Location: [facility] INVASIVE CV LAB;  Service: Cardiovascular;  Laterality: N/A;   RIGHT/LEFT HEART CATH AND CORONARY/GRAFT ANGIOGRAPHY N/A 05/22/2022   Procedure: RIGHT/LEFT HEART CATH AND CORONARY/GRAFT ANGIOGRAPHY;  Surgeon: Corky Crafts, MD;   Location: [facility] INVASIVE CV LAB;  Service: Cardiovascular;  Laterality: N/A;   rotator cuff surgery Right 2017   SCLEROTHERAPY  06/06/2022   Procedure: SCLEROTHERAPY;  Surgeon: Benancio Deeds, MD;  Location: [facility] ENDOSCOPY;  Service: Gastroenterology;;   TONSILLECTOMY  1956   WISDOM TOOTH EXTRACTION      reports that he has never smoked. He has never used smokeless tobacco. He reports that he does not currently use alcohol. He reports that he does not use drugs. family history includes Coronary artery disease (age of onset: 30) in an other family member; Diabetes in his father and another family member; Hyperlipidemia in an other family member; Hypertension in an other family member; Multiple myeloma in his mother; Renal Disease in his father. Allergies  Allergen Reactions   Spironolactone     Cramps, dizziness   Cayenne Other (See Comments)    Sweats w/paprika too   Niacin And Related Other (See Comments)    Upset stomach   Sulfa Antibiotics Itching and Rash    "have no idea; mother told me I was allergic to"    Sulfonamide Derivatives  Other (See Comments)    "have no idea; mother told me I was allergic to"      Outpatient Encounter Medications as of 06/08/2023  Medication Sig   acetaminophen (TYLENOL) 500 MG tablet Take 1,000 mg by mouth every 6 (six) hours as needed for moderate pain, mild pain or headache.   atorvastatin (LIPITOR) 40 MG tablet Take 1 tablet (40 mg total) by mouth daily.   Carboxymethylcellulose Sodium (DRY EYE RELIEF OP) Place 1 drop into both eyes daily as needed (Dry eye).   Cholecalciferol 25 MCG (1000 UT) capsule Take 1,000 Units by mouth daily with supper.    ELIQUIS 5 MG TABS tablet TAKE ONE TABLET BY MOUTH TWICE DAILY   furosemide (LASIX) 20 MG tablet Take 1 tablet once daily for 5 days, then take daily as needed   isosorbide mononitrate (IMDUR) 60 MG 24 hr tablet Take 1 tablet (60 mg total) by mouth daily.   losartan (COZAAR) 25 MG tablet TAKE ONE TABLET BY  MOUTH DAILY   nitroGLYCERIN (NITROSTAT) 0.4 MG SL tablet Place 1 tablet (0.4 mg total) under the tongue every 5 (five) minutes as needed.   NONFORMULARY OR COMPOUNDED ITEM Apply 1 application topically daily as needed (rash). Cetaphil + triamcinolone cream   pantoprazole (PROTONIX) 40 MG tablet Take 1 tablet (40 mg total) by mouth 2 (two) times daily. (Patient taking differently: Take 40 mg by mouth daily.)   triamcinolone cream (KENALOG) 0.5 % Apply 1 Application topically 3 (three) times daily.   [DISCONTINUED] hyoscyamine (LEVSIN) 0.125 MG tablet Take 1-2 tablets (0.125-0.25 mg total) by mouth every 4 (four) hours as needed for cramping.   No facility-administered encounter medications on file as of 06/08/2023.    REVIEW OF SYSTEMS  : All other systems reviewed and negative except where noted in the History of Present Illness.   PHYSICAL EXAM: BP (!) 138/56   Pulse (!) 54   Ht 5\' 11"  (1.803 m)   Wt 184 lb 8 oz (83.7 kg)   BMI 25.73 kg/m  General: Well developed white male in no acute distress Head: Normocephalic and atraumatic Eyes:  Sclerae anicteric, conjunctiva pink. Ears: Normal auditory acuity Lungs: Clear throughout to auscultation; no W/R/R. Heart:  Irregularly irregular. Abdomen: Soft, non-distended.  BS present.  Minimal upper abdominal TTP. Musculoskeletal: Symmetrical with no gross deformities  Skin: No lesions on visible extremities Extremities: No edema  Neurological: Alert oriented x 4, grossly non-focal Psychological:  Alert and cooperative. Normal mood and affect  ASSESSMENT AND PLAN: *Epigastric abdominal pain and bloating: Does not feel like it is heartburn/reflux, but gets almost immediate relief with Pepto-Bismol.  Interestingly gets relief with diuretics as well.  No improvement with Flagyl or Levsin.  Had been on pantoprazole twice daily after his GI bleed with Diuelafoy lesion in 2023, but is now only on once daily.  Last EGD was in 11/18/2022.  He did have a  CT scan in March 2023.  He is willing to increase his pantoprazole back to twice daily for now to see if it makes a difference.  He is can do this for a month and if no improvement will call us back and we will consider repeat CT scan.  Patient also has a follow-up with Dr. Adela Lank in late August so we will keep that in place for now as well.  CC:  [name], Georgina Quint, MD

## 2023-06-08 NOTE — Patient Instructions (Signed)
Increase Pantoprazole 40 mg to twice daily.   Send Mychart message in 1 month with update.   _______________________________________________________  If your blood pressure at your visit was 140/90 or greater, please contact your primary care physician to follow up on this.  _______________________________________________________  If you are age 75 or older, your body mass index should be between 23-30. Your Body mass index is 25.73 kg/m. If this is out of the aforementioned range listed, please consider follow up with your Primary Care Provider.  If you are age 34 or younger, your body mass index should be between 19-25. Your Body mass index is 25.73 kg/m. If this is out of the aformentioned range listed, please consider follow up with your Primary Care Provider.   ________________________________________________________  The [facility] providers would like to encourage you to use [facility] to communicate with providers for non-urgent requests or questions.  Due to long hold times on the telephone, sending your provider a message by [facility] may be a faster and more efficient way to get a response.  Please allow 48 business hours for a response.  Please remember that this is for non-urgent requests.  _______________________________________________________

## 2023-06-10 ENCOUNTER — Ambulatory Visit (INDEPENDENT_AMBULATORY_CARE_PROVIDER_SITE_OTHER): Payer: PPO

## 2023-06-10 DIAGNOSIS — I495 Sick sinus syndrome: Secondary | ICD-10-CM

## 2023-06-10 LAB — CUP PACEART REMOTE DEVICE CHECK
Battery Remaining Longevity: 177 mo
Battery Voltage: 3.18 V
Brady Statistic AP VP Percent: 0 %
Brady Statistic AP VS Percent: 0 %
Brady Statistic AS VP Percent: 0.51 %
Brady Statistic AS VS Percent: 99.5 %
Brady Statistic RA Percent Paced: 0 %
Brady Statistic RV Percent Paced: 0.51 %
Date Time Interrogation Session: 20240711021912
Implantable Lead Connection Status: 753985
Implantable Lead Connection Status: 753985
Implantable Lead Implant Date: 20130816
Implantable Lead Implant Date: 20130816
Implantable Lead Location: 753859
Implantable Lead Location: 753860
Implantable Lead Model: 5076
Implantable Lead Model: 5076
Implantable Pulse Generator Implant Date: 20231011
Lead Channel Impedance Value: 304 Ohm
Lead Channel Impedance Value: 456 Ohm
Lead Channel Impedance Value: 608 Ohm
Lead Channel Impedance Value: 855 Ohm
Lead Channel Pacing Threshold Amplitude: 2.5 V
Lead Channel Pacing Threshold Pulse Width: 0.4 ms
Lead Channel Sensing Intrinsic Amplitude: 0.375 mV
Lead Channel Sensing Intrinsic Amplitude: 14.5 mV
Lead Channel Sensing Intrinsic Amplitude: 14.5 mV
Lead Channel Setting Pacing Amplitude: 2.5 V
Lead Channel Setting Pacing Pulse Width: 1 ms
Lead Channel Setting Sensing Sensitivity: 0.9 mV
Zone Setting Status: 755011

## 2023-06-11 NOTE — Progress Notes (Signed)
Agree with assessment and plan as outlined.  

## 2023-06-20 ENCOUNTER — Encounter: Payer: Self-pay | Admitting: Cardiology

## 2023-06-20 DIAGNOSIS — R140 Abdominal distension (gaseous): Secondary | ICD-10-CM

## 2023-06-20 DIAGNOSIS — G8929 Other chronic pain: Secondary | ICD-10-CM

## 2023-06-21 NOTE — Telephone Encounter (Signed)
CT order in epic. Secure staff message sent to radiology scheduling to contact patient to set up appt.   Patient notified via MyChart.

## 2023-06-21 NOTE — Telephone Encounter (Signed)
Dr. Adela Lank, pt last saw [facility]. Shanda Bumps is out of the office all week. Can we proceed with CT scan?

## 2023-06-21 NOTE — Telephone Encounter (Signed)
Per Dr. Adela Lank - I reviewed her note.  Yes okay to schedule a CT scan abdomen pelvis with contrast for ongoing abdominal pain.  Thanks.

## 2023-06-28 NOTE — Progress Notes (Signed)
Remote pacemaker transmission.   

## 2023-06-30 ENCOUNTER — Ambulatory Visit (HOSPITAL_COMMUNITY)
Admission: RE | Admit: 2023-06-30 | Discharge: 2023-06-30 | Disposition: A | Discharge status: Home / Self Care | Payer: PPO | Source: Ambulatory Visit | Attending: Gastroenterology | Admitting: Gastroenterology

## 2023-06-30 DIAGNOSIS — R188 Other ascites: Secondary | ICD-10-CM | POA: Diagnosis not present

## 2023-06-30 DIAGNOSIS — R1013 Epigastric pain: Secondary | ICD-10-CM | POA: Insufficient documentation

## 2023-06-30 DIAGNOSIS — R140 Abdominal distension (gaseous): Secondary | ICD-10-CM

## 2023-06-30 DIAGNOSIS — K7460 Unspecified cirrhosis of liver: Secondary | ICD-10-CM | POA: Diagnosis not present

## 2023-06-30 DIAGNOSIS — K766 Portal hypertension: Secondary | ICD-10-CM | POA: Diagnosis not present

## 2023-06-30 DIAGNOSIS — G8929 Other chronic pain: Secondary | ICD-10-CM | POA: Diagnosis not present

## 2023-06-30 DIAGNOSIS — K5730 Diverticulosis of large intestine without perforation or abscess without bleeding: Secondary | ICD-10-CM | POA: Diagnosis not present

## 2023-06-30 MED ORDER — SODIUM CHLORIDE (PF) 0.9 % IJ SOLN
INTRAMUSCULAR | Status: AC
  Filled 2023-06-30: qty 50

## 2023-06-30 MED ORDER — IOHEXOL 300 MG/ML  SOLN
100.0000 mL | Freq: Once | INTRAMUSCULAR | Status: AC | PRN
  Administered 2023-06-30: 100 mL via INTRAVENOUS

## 2023-07-01 ENCOUNTER — Encounter: Payer: Self-pay | Admitting: Gastroenterology

## 2023-07-02 ENCOUNTER — Other Ambulatory Visit: Payer: Self-pay

## 2023-07-02 DIAGNOSIS — R188 Other ascites: Secondary | ICD-10-CM

## 2023-07-02 DIAGNOSIS — R7989 Other specified abnormal findings of blood chemistry: Secondary | ICD-10-CM

## 2023-07-02 NOTE — Telephone Encounter (Signed)
This was already addressed

## 2023-07-04 ENCOUNTER — Encounter: Payer: Self-pay | Admitting: Gastroenterology

## 2023-07-05 NOTE — Telephone Encounter (Signed)
This has been addressed, see alternate patient message.

## 2023-07-06 ENCOUNTER — Other Ambulatory Visit (HOSPITAL_COMMUNITY): Payer: PPO

## 2023-07-07 ENCOUNTER — Ambulatory Visit (HOSPITAL_COMMUNITY)
Admission: RE | Admit: 2023-07-07 | Discharge: 2023-07-07 | Disposition: A | Discharge status: Home / Self Care | Payer: PPO | Source: Ambulatory Visit | Attending: Gastroenterology | Admitting: Gastroenterology

## 2023-07-07 DIAGNOSIS — R7989 Other specified abnormal findings of blood chemistry: Secondary | ICD-10-CM | POA: Diagnosis not present

## 2023-07-07 DIAGNOSIS — K7460 Unspecified cirrhosis of liver: Secondary | ICD-10-CM | POA: Insufficient documentation

## 2023-07-07 DIAGNOSIS — R188 Other ascites: Secondary | ICD-10-CM | POA: Insufficient documentation

## 2023-07-07 HISTORY — PX: IR PARACENTESIS: IMG2679

## 2023-07-07 LAB — BODY FLUID CELL COUNT WITH DIFFERENTIAL
Eos, Fluid: 0 %
Lymphs, Fluid: 51 %
Monocyte-Macrophage-Serous Fluid: 48 % — ABNORMAL LOW (ref 50–90)
Neutrophil Count, Fluid: 1 % (ref 0–25)
Total Nucleated Cell Count, Fluid: 1183 cu mm — ABNORMAL HIGH (ref 0–1000)

## 2023-07-07 LAB — PROTEIN, PLEURAL OR PERITONEAL FLUID: Total protein, fluid: 4.2 g/dL

## 2023-07-07 LAB — ALBUMIN, PLEURAL OR PERITONEAL FLUID: Albumin, Fluid: 2.8 g/dL

## 2023-07-07 MED ORDER — LIDOCAINE HCL 1 % IJ SOLN
INTRAMUSCULAR | Status: AC
  Filled 2023-07-07: qty 20

## 2023-07-07 MED ORDER — LIDOCAINE HCL 1 % IJ SOLN
10.0000 mL | Freq: Once | INTRAMUSCULAR | Status: AC
  Administered 2023-07-07: 10 mL via INTRADERMAL

## 2023-07-07 NOTE — Procedures (Signed)
PROCEDURE SUMMARY:  Successful US guided paracentesis from right lateral abdomen.  Yielded 400 mL of bright yellow fluid.  No immediate complications.  Pt tolerated well.   Specimen was sent for labs.  EBL < 5mL  Hoyt Koch PA-C 07/07/2023 10:18 AM

## 2023-07-08 ENCOUNTER — Other Ambulatory Visit: Payer: Self-pay

## 2023-07-08 DIAGNOSIS — J90 Pleural effusion, not elsewhere classified: Secondary | ICD-10-CM

## 2023-07-09 ENCOUNTER — Other Ambulatory Visit (INDEPENDENT_AMBULATORY_CARE_PROVIDER_SITE_OTHER): Payer: PPO

## 2023-07-09 DIAGNOSIS — K7460 Unspecified cirrhosis of liver: Secondary | ICD-10-CM

## 2023-07-09 DIAGNOSIS — R188 Other ascites: Secondary | ICD-10-CM

## 2023-07-09 DIAGNOSIS — R7989 Other specified abnormal findings of blood chemistry: Secondary | ICD-10-CM

## 2023-07-09 LAB — CBC WITH DIFFERENTIAL/PLATELET
Basophils Absolute: 0 10*3/uL (ref 0.0–0.1)
Basophils Relative: 0.4 % (ref 0.0–3.0)
Eosinophils Absolute: 0.1 10*3/uL (ref 0.0–0.7)
Eosinophils Relative: 1.9 % (ref 0.0–5.0)
HCT: 42.2 % (ref 39.0–52.0)
Hemoglobin: 13.7 g/dL (ref 13.0–17.0)
Lymphocytes Relative: 27.4 % (ref 12.0–46.0)
Lymphs Abs: 1.4 10*3/uL (ref 0.7–4.0)
MCHC: 32.5 g/dL (ref 30.0–36.0)
MCV: 85.5 fl (ref 78.0–100.0)
Monocytes Absolute: 0.8 10*3/uL (ref 0.1–1.0)
Monocytes Relative: 15.8 % — ABNORMAL HIGH (ref 3.0–12.0)
Neutro Abs: 2.7 10*3/uL (ref 1.4–7.7)
Neutrophils Relative %: 54.5 % (ref 43.0–77.0)
Platelets: 155 10*3/uL (ref 150.0–400.0)
RBC: 4.94 Mil/uL (ref 4.22–5.81)
RDW: 15.2 % (ref 11.5–15.5)
WBC: 5 10*3/uL (ref 4.0–10.5)

## 2023-07-09 LAB — COMPREHENSIVE METABOLIC PANEL
ALT: 23 U/L (ref 0–53)
AST: 28 U/L (ref 0–37)
Albumin: 4.7 g/dL (ref 3.5–5.2)
Alkaline Phosphatase: 147 U/L — ABNORMAL HIGH (ref 39–117)
BUN: 23 mg/dL (ref 6–23)
CO2: 29 mEq/L (ref 19–32)
Calcium: 10 mg/dL (ref 8.4–10.5)
Chloride: 100 mEq/L (ref 96–112)
Creatinine, Ser: 1.34 mg/dL (ref 0.40–1.50)
GFR: 51.83 mL/min — ABNORMAL LOW (ref >60.00)
Glucose, Bld: 93 mg/dL (ref 70–99)
Potassium: 4 mEq/L (ref 3.5–5.1)
Sodium: 139 mEq/L (ref 135–145)
Total Bilirubin: 4.6 mg/dL — ABNORMAL HIGH (ref 0.2–1.2)
Total Protein: 7.4 g/dL (ref 6.0–8.3)

## 2023-07-12 ENCOUNTER — Ambulatory Visit (HOSPITAL_COMMUNITY)
Admission: RE | Admit: 2023-07-12 | Discharge: 2023-07-12 | Disposition: A | Discharge status: Home / Self Care | Payer: PPO | Source: Ambulatory Visit | Attending: Gastroenterology | Admitting: Gastroenterology

## 2023-07-12 ENCOUNTER — Other Ambulatory Visit: Payer: Self-pay | Admitting: Gastroenterology

## 2023-07-12 DIAGNOSIS — J90 Pleural effusion, not elsewhere classified: Secondary | ICD-10-CM

## 2023-07-12 DIAGNOSIS — Z8709 Personal history of other diseases of the respiratory system: Secondary | ICD-10-CM | POA: Diagnosis not present

## 2023-07-12 DIAGNOSIS — Z0389 Encounter for observation for other suspected diseases and conditions ruled out: Secondary | ICD-10-CM | POA: Diagnosis not present

## 2023-07-12 MED ORDER — LIDOCAINE HCL 1 % IJ SOLN
INTRAMUSCULAR | Status: AC
  Filled 2023-07-12: qty 20

## 2023-07-12 NOTE — Progress Notes (Signed)
Request for diagnostic and therapeutic right thoracentesis.  Limited US chest shows no pleural fluid on either side. Patient reports being on Lasix with improvement in fluid overload.  No procedure performed today, patient discharged home.  Images available for review under imaging section in Epic.  Lynnette Caffey, PA-C

## 2023-07-15 ENCOUNTER — Other Ambulatory Visit: Payer: Self-pay

## 2023-07-15 DIAGNOSIS — K7460 Unspecified cirrhosis of liver: Secondary | ICD-10-CM

## 2023-07-20 ENCOUNTER — Other Ambulatory Visit (INDEPENDENT_AMBULATORY_CARE_PROVIDER_SITE_OTHER): Payer: PPO

## 2023-07-20 DIAGNOSIS — R188 Other ascites: Secondary | ICD-10-CM | POA: Diagnosis not present

## 2023-07-20 DIAGNOSIS — K7460 Unspecified cirrhosis of liver: Secondary | ICD-10-CM | POA: Diagnosis not present

## 2023-07-20 LAB — BASIC METABOLIC PANEL
BUN: 29 mg/dL — ABNORMAL HIGH (ref 6–23)
CO2: 29 mEq/L (ref 19–32)
Calcium: 10 mg/dL (ref 8.4–10.5)
Chloride: 100 mEq/L (ref 96–112)
Creatinine, Ser: 1.26 mg/dL (ref 0.40–1.50)
GFR: 55.79 mL/min — ABNORMAL LOW (ref >60.00)
Glucose, Bld: 107 mg/dL — ABNORMAL HIGH (ref 70–99)
Potassium: 4 mEq/L (ref 3.5–5.1)
Sodium: 139 mEq/L (ref 135–145)

## 2023-07-23 ENCOUNTER — Other Ambulatory Visit (HOSPITAL_COMMUNITY): Payer: Self-pay | Admitting: *Deleted

## 2023-07-23 ENCOUNTER — Encounter: Payer: Self-pay | Admitting: Gastroenterology

## 2023-07-23 ENCOUNTER — Encounter (HOSPITAL_COMMUNITY): Payer: Self-pay

## 2023-07-23 DIAGNOSIS — R072 Precordial pain: Secondary | ICD-10-CM

## 2023-07-26 MED ORDER — REGADENOSON 0.4 MG/5ML IV SOLN
0.4000 mg | Freq: Once | INTRAVENOUS | Status: AC
  Administered 2023-07-27: 0.4 mg via INTRAVENOUS

## 2023-07-27 ENCOUNTER — Encounter (HOSPITAL_COMMUNITY)
Admission: RE | Admit: 2023-07-27 | Discharge: 2023-07-27 | Disposition: A | Discharge status: Home / Self Care | Payer: PPO | Source: Ambulatory Visit | Attending: Cardiology | Admitting: Cardiology

## 2023-07-27 DIAGNOSIS — I4821 Permanent atrial fibrillation: Secondary | ICD-10-CM | POA: Insufficient documentation

## 2023-07-27 DIAGNOSIS — E785 Hyperlipidemia, unspecified: Secondary | ICD-10-CM | POA: Insufficient documentation

## 2023-07-27 DIAGNOSIS — I25810 Atherosclerosis of coronary artery bypass graft(s) without angina pectoris: Secondary | ICD-10-CM | POA: Diagnosis not present

## 2023-07-27 DIAGNOSIS — Z0389 Encounter for observation for other suspected diseases and conditions ruled out: Secondary | ICD-10-CM | POA: Diagnosis not present

## 2023-07-27 LAB — NM PET CT CARDIAC PERFUSION MULTI W/ABSOLUTE BLOODFLOW
LV dias vol: 106 mL (ref 62–150)
LV sys vol: 63 mL
MBFR: 2.12
Nuc Rest EF: 61 %
Nuc Stress EF: 60 %
Rest MBF: 0.66 ml/g/min
Rest Nuclear Isotope Dose: 19.3 mCi
ST Depression (mm): 0 mm
Stress MBF: 1.4 ml/g/min
Stress Nuclear Isotope Dose: 19.4 mCi
TID: 1.16

## 2023-07-27 MED ORDER — REGADENOSON 0.4 MG/5ML IV SOLN
INTRAVENOUS | Status: AC
  Filled 2023-07-27: qty 5

## 2023-07-27 MED ORDER — RUBIDIUM RB82 GENERATOR (RUBYFILL)
19.3800 | PACK | Freq: Once | INTRAVENOUS | Status: AC
  Administered 2023-07-27: 19.38 via INTRAVENOUS

## 2023-07-27 MED ORDER — RUBIDIUM RB82 GENERATOR (RUBYFILL)
19.2700 | PACK | Freq: Once | INTRAVENOUS | Status: AC
  Administered 2023-07-27: 19.27 via INTRAVENOUS

## 2023-07-27 NOTE — Progress Notes (Signed)
Tolerated Cardiac PET well.... states he had some pressure during the test.

## 2023-07-28 ENCOUNTER — Ambulatory Visit: Payer: PPO | Admitting: Gastroenterology

## 2023-07-28 ENCOUNTER — Encounter: Payer: Self-pay | Admitting: Gastroenterology

## 2023-07-28 VITALS — BP 118/62 | HR 72 | Ht 71.0 in | Wt 166.0 lb

## 2023-07-28 DIAGNOSIS — K7460 Unspecified cirrhosis of liver: Secondary | ICD-10-CM | POA: Diagnosis not present

## 2023-07-28 DIAGNOSIS — K760 Fatty (change of) liver, not elsewhere classified: Secondary | ICD-10-CM

## 2023-07-28 DIAGNOSIS — R188 Other ascites: Secondary | ICD-10-CM

## 2023-07-28 NOTE — Patient Instructions (Addendum)
If your blood pressure at your visit was 140/90 or greater, please contact your primary care physician to follow up on this. ______________________________________________________  If you are age 75 or older, your body mass index should be between 23-30. Your Body mass index is 23.15 kg/m. If this is out of the aforementioned range listed, please consider follow up with your Primary Care Provider.  If you are age 5 or younger, your body mass index should be between 19-25. Your Body mass index is 23.15 kg/m. If this is out of the aformentioned range listed, please consider follow up with your Primary Care Provider.  ________________________________________________________  The [facility] providers would like to encourage you to use [facility] to communicate with providers for non-urgent requests or questions.  Due to long hold times on the telephone, sending your provider a message by [facility] may be a faster and more efficient way to get a response.  Please allow 48 business hours for a response.  Please remember that this is for non-urgent requests.  _______________________________________________________  Due to recent changes in healthcare laws, you may see the results of your imaging and laboratory studies on MyChart before your provider has had a chance to review them.  We understand that in some cases there may be results that are confusing or concerning to you. Not all laboratory results come back in the same time frame and the provider may be waiting for multiple results in order to interpret others.  Please give [place] 48 hours in order for your provider to thoroughly review all the results before contacting the office for clarification of your results.   We are giving you a low sodium handout today.  You should have less than 2 g of sodium each day.  Taper your lasix as discussed, as tolerated  Decrease Lipitor to 20 mg once daily and discuss with Cardiology  Please come to the lab in 1 to 2  weeks for a CMET and AFP.  Our lab is located in the basement of our building, located at [address]. They are open Monday through Friday from 7:30 am to 5:00 pm. You do not need an appointment.  You will be due for a ultrasound of your liver in January.  You have been scheduled for a follow up appointment on Tuesday, 11-16-23 at 3:40pm. Please arrive 10 minutes early for registration. If you need to reschedule or cancel this appointment please call [phone] as soon as possible. Thank you.  Thank you for entrusting me with your care and for choosing [facility], Dr. Ileene Patrick     .

## 2023-07-28 NOTE — Progress Notes (Signed)
HPI :  77 year old male here for follow-up visit for cirrhosis of the liver.  Recall he has past medical history including CAD status post CABG x 5, persistent atrial fibrillation on Eliquis, sinus node dysfunction with pacemaker, aortic stenosis and history of GERD on chronic pantoprazole.  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      Recall his last visit with [place] he saw Doug Sou in July for some abdominal pain that had been ongoing for a few months, along with feelings of bloating.  She proceeded to have a CT scan ordered with results as outlined.  CT scan abdomen / pelvis 06/30/23: IMPRESSION: Hepatic cirrhosis and findings of portal venous hypertension. No evidence of hepatic neoplasm.   New moderate ascites and diffuse mesenteric edema.   Increased size of moderate right pleural effusion.   Colonic diverticulosis, without radiographic evidence of diverticulitis.   We contacted him with the reports of the study, new diagnosis of cirrhosis. This led to a paracentesis in which 400 cc were removed,, SAAG greater than 1.1, no evidence of SBP.  Cytology negative.  Total protein 4.2.  He was also referred for thoracentesis given the pleural effusion however upon attempts to drain the fluid there was no effusion noted on the ultrasound.  He subsequently underwent a serologic workup for chronic liver diseases which were negative.    The main issue he was complaining of at the time was ascites and abdominal distention.  He had Lasix at home and we resumed it at twice daily dosing.  Follow-up labs showed normal potassium level and stable BUN/creatinine.  We continue the regimen recommended a low-sodium diet.  He has done remarkably well with this.  He states he has been on a strict no sodium diet at all.  His food is incredibly bland.  He has been eating healthier, exercising more.  The edema is completely gone.  He states he lost  about 20 pounds through this ordeal.  He denies any lower extremity edema.  He is not drinking any alcohol.  He does not have any jaundice, no history of varices, no encephalopathy, mental status sharp.  He is accompanied by his wife today  I reviewed his chart and noticed he has had fatty liver noted on imaging dating back for a few years.  He actually had an ultrasound with elastography in 2021 with a median K PA of 6.8, low risk for fibrosis.  It was not thought he had cirrhosis.  Imaging even in 2023 did not show any evidence of cirrhosis, only fatty liver.  This is the first time he had imaging suggesting cirrhosis.  He denies any significant alcohol intake in the past.  He would often have 1 to 2 glasses of wine per night with dinner, but this was not every night.  Denies any excessive alcohol intake otherwise.  He has been completely alcohol free since he was diagnosed with cirrhosis.  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  His bilirubin has been in the fours more recently.  He does continue to take Eliquis, no bleeding symptoms.  His platelet count is 155.  Of note given total protein was elevated in the ascitic fluid I had suggested an echocardiogram.  He was seen by his cardiologist and had a cardiac PET scan just yesterday.  EF looks okay but he does have some  ischemic changes noted in report as below.  Prior workup: Fatty liver noted on imaging in 2023 [place] with elastography 2021 showed Median kPa: 6.8   Cardiac PET scan 07/27/23:   Findings are consistent with mild (< 10% myocardium) apical-mid lateral ischemia.  There is associated decrease in LCX stress flow (1.15 mL/min/g). The study is intermediate risk in the setting of quantiative TID and presence of RV myocardial uptake.   LV perfusion is abnormal. Defect 1: There is a medium defect with moderate reduction in uptake present in the apical to mid anterolateral and  inferolateral location(s) that is reversible. There is normal wall motion in the defect area. Consistent with ischemia.   Rest left ventricular function is normal. Rest EF: 61%. Stress left ventricular function is normal. Stress EF: 60%. End diastolic cavity size is normal. End systolic cavity size is normal.   Myocardial blood flow was computed to be 0.53ml/g/min at rest and 1.65ml/g/min at stress. Global myocardial blood flow reserve was 2.12 and was normal.   Coronary calcium assessment not performed due to prior revascularization. Aortic valve calcification noted. Mitral annular calcification noted.  A dual lead CIED is noted.  Right atrial lead terminates into the right atrial appendage.  Right ventricular lead terminates in the right ventricular apex.  Contained within the SVC through its course.   EGD 10/2022: - Z-line irregular, did not meet criteria for Barrett's. - 1 cm hiatal hernia. - Normal esophagus otherwise - Two inflamed gastric polyps. Resected and retrieved as outlined. Clips were placed prophylactically. Rule out adenomas - Multiple benign fundic gland polyps - Normal stomach otherwise - Normal examined duodenum.   Surg path:  Hyperplastic polyps with no H. pylori   Colonoscopy July 2021 showed a single 1 mm polyp that was removed (tubular adenoma), moderate diverticulosis, and internal and external hemorrhoids.  Past Medical History:  Diagnosis Date   Anemia    on Accrufer   Anxiety    Aortic stenosis    Mild, echo, April, 2014   Blood transfusion without reported diagnosis 2023   BPH (benign prostatic hyperplasia)    CAD (coronary artery disease)    a. s/p CABG   Carotid artery disease (HCC)    Cataract 2020   bilateral sx   Cirrhosis of liver (HCC)    Colonic polyp    Diverticulosis    Elevated bilirubin    Mild chronic elevation, 2.0 January, 2011 stable   GERD (gastroesophageal reflux disease)    Barrett's esophagus-on meds   Gilbert's disease    History of  kidney stones    HTN (hypertension)    on meds   Hyperlipidemia    Low HDL-on meds   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HCC)    Left  lung chest x-ray July, 2013   Persistent atrial fibrillation (HCC)    a. s/p PVI at [facility]   Precancerous lesion    Forehead   Primary osteoarthritis of left knee    Mild-bilateral LE   Prolapsed internal hemorrhoids, grade 3 08/12/2015   PVC's (premature ventricular contractions)    Seasonal allergies    Tubular adenoma of colon      Past Surgical History:  Procedure Laterality Date   ABLATION     PVI at [facility]   ATRIAL FIBRILLATION ABLATION N/A 01/25/2019   Procedure: ATRIAL FIBRILLATION ABLATION;  Surgeon: Hillis Range, MD;  Location: [facility] INVASIVE CV LAB;  Service: Cardiovascular;  Laterality: N/A;   CARDIOVERSION N/A 10/14/2018   Procedure: CARDIOVERSION;  Surgeon: Dietrich Pates  V, MD;  Location: [facility] ENDOSCOPY;  Service: Cardiovascular;  Laterality: N/A;   CARDIOVERSION N/A 08/18/2019   Procedure: CARDIOVERSION;  Surgeon: [name] Bathe, MD;  Location: [facility] ENDOSCOPY;  Service: Cardiovascular;  Laterality: N/A;   COLONOSCOPY     CORONARY ARTERY BYPASS GRAFT  2000   CABG X5   CORONARY PRESSURE/FFR STUDY N/A 01/05/2018   Procedure: INTRAVASCULAR PRESSURE WIRE/FFR STUDY;  Surgeon: Tonny Bollman, MD;  Location: [facility] INVASIVE CV LAB;  Service: Cardiovascular;  Laterality: N/A;   CORONARY STENT INTERVENTION N/A 05/22/2022   Procedure: CORONARY STENT INTERVENTION;  Surgeon: Corky Crafts, MD;  Location: [facility] INVASIVE CV LAB;  Service: Cardiovascular;  Laterality: N/A;   CORONARY ULTRASOUND/IVUS N/A 05/22/2022   Procedure: Intravascular Ultrasound/IVUS;  Surgeon: Corky Crafts, MD;  Location: [facility] INVASIVE CV LAB;  Service: Cardiovascular;  Laterality: N/A;   CYSTOSCOPY WITH RETROGRADE PYELOGRAM, URETEROSCOPY AND STENT PLACEMENT Bilateral 09/04/2020   Procedure: CYSTOSCOPY WITH BILATERAL RETROGRADE PYELOGRAM, RIGHT URETEROSCOPY AND RIGHT  STENT PLACEMENT;  Surgeon:  Marcine Matar, MD;  Location: [facility] ORS;  Service: Urology;  Laterality: Bilateral;   ENTEROSCOPY N/A 06/06/2022   Procedure: ENTEROSCOPY;  Surgeon: Benancio Deeds, MD;  Location: [facility] ENDOSCOPY;  Service: Gastroenterology;  Laterality: N/A;   ESOPHAGOGASTRODUODENOSCOPY     HEAD & NECK SKIN LESION EXCISIONAL BIOPSY     HEMORRHOID BANDING     HEMOSTASIS CLIP PLACEMENT  06/06/2022   Procedure: HEMOSTASIS CLIP PLACEMENT;  Surgeon: Benancio Deeds, MD;  Location: [facility] ENDOSCOPY;  Service: Gastroenterology;;   HOT HEMOSTASIS N/A 06/06/2022   Procedure: HOT HEMOSTASIS (ARGON PLASMA COAGULATION/BICAP);  Surgeon: Benancio Deeds, MD;  Location: [facility] ENDOSCOPY;  Service: Gastroenterology;  Laterality: N/A;   IR PARACENTESIS  07/07/2023   LEFT HEART CATH AND CORS/GRAFTS ANGIOGRAPHY N/A 01/05/2018   Procedure: LEFT HEART CATH AND CORS/GRAFTS ANGIOGRAPHY;  Surgeon: Tonny Bollman, MD;  Location: [facility] INVASIVE CV LAB;  Service: Cardiovascular;  Laterality: N/A;   PERMANENT PACEMAKER INSERTION N/A 07/15/2012   Procedure: PERMANENT PACEMAKER INSERTION;  Surgeon: Duke Salvia, MD;  Location: [facility] CATH LAB;  Service: Cardiovascular;  Laterality: N/A;   PPM GENERATOR CHANGEOUT N/A 09/09/2022   Procedure: PPM GENERATOR CHANGEOUT;  Surgeon: Duke Salvia, MD;  Location: [facility] INVASIVE CV LAB;  Service: Cardiovascular;  Laterality: N/A;   RIGHT/LEFT HEART CATH AND CORONARY/GRAFT ANGIOGRAPHY N/A 05/22/2022   Procedure: RIGHT/LEFT HEART CATH AND CORONARY/GRAFT ANGIOGRAPHY;  Surgeon: Corky Crafts, MD;  Location: [facility] INVASIVE CV LAB;  Service: Cardiovascular;  Laterality: N/A;   rotator cuff surgery Right 2017   SCLEROTHERAPY  06/06/2022   Procedure: SCLEROTHERAPY;  Surgeon: Benancio Deeds, MD;  Location: [facility] ENDOSCOPY;  Service: Gastroenterology;;   TONSILLECTOMY  1956   WISDOM TOOTH EXTRACTION     Family History  Problem Relation Age of Onset   Multiple myeloma Mother    Diabetes Father    Renal  Disease Father    Coronary artery disease Other 73   Diabetes Other    Hyperlipidemia Other    Hypertension Other    Colon cancer Neg Hx    Esophageal cancer Neg Hx    Stomach cancer Neg Hx    Rectal cancer Neg Hx    Colon polyps Neg Hx    Social History   Tobacco Use   Smoking status: Never   Smokeless tobacco: Never  Vaping Use   Vaping status: Never Used  Substance Use Topics   Alcohol use: Not Currently    Alcohol/week: 0.0 -  5.0 standard drinks of alcohol    Comment: 1-2 glasses wine   Drug use: No   Current Outpatient Medications  Medication Sig Dispense Refill   acetaminophen (TYLENOL) 500 MG tablet Take 1,000 mg by mouth every 6 (six) hours as needed for moderate pain, mild pain or headache.     atorvastatin (LIPITOR) 40 MG tablet Take 1 tablet (40 mg total) by mouth daily. 90 tablet 1   Carboxymethylcellulose Sodium (DRY EYE RELIEF OP) Place 1 drop into both eyes daily as needed (Dry eye).     Cholecalciferol 25 MCG (1000 UT) capsule Take 1,000 Units by mouth daily with supper.      ELIQUIS 5 MG TABS tablet TAKE ONE TABLET BY MOUTH TWICE DAILY 60 tablet 11   furosemide (LASIX) 20 MG tablet Take 1 tablet once daily for 5 days, then take daily as needed 30 tablet 3   isosorbide mononitrate (IMDUR) 60 MG 24 hr tablet Take 1 tablet (60 mg total) by mouth daily. 90 tablet 3   losartan (COZAAR) 25 MG tablet TAKE ONE TABLET BY MOUTH DAILY 90 tablet 3   nitroGLYCERIN (NITROSTAT) 0.4 MG SL tablet Place 1 tablet (0.4 mg total) under the tongue every 5 (five) minutes as needed. 25 tablet 2   NONFORMULARY OR COMPOUNDED ITEM Apply 1 application topically daily as needed (rash). Cetaphil + triamcinolone cream     pantoprazole (PROTONIX) 40 MG tablet Take 1 tablet (40 mg total) by mouth 2 (two) times daily. (Patient taking differently: Take 40 mg by mouth daily.) 180 tablet 1   triamcinolone cream (KENALOG) 0.5 % Apply 1 Application topically 3 (three) times daily. 30 g 1   No  current facility-administered medications for this visit.   Allergies  Allergen Reactions   Spironolactone     Cramps, dizziness   Cayenne Other (See Comments)    Sweats w/paprika too   Niacin And Related Other (See Comments)    Upset stomach   Sulfa Antibiotics Itching and Rash    "have no idea; mother told me I was allergic to"    Sulfonamide Derivatives Other (See Comments)    "have no idea; mother told me I was allergic to"     Review of Systems: All systems reviewed and negative except where noted in HPI.    NM PET CT CARDIAC PERFUSION MULTI W/ABSOLUTE BLOODFLOW  Result Date: 07/27/2023   Findings are consistent with mild (< 10% myocardium) apical-mid lateral ischemia.  There is associated decrease in LCX stress flow (1.15 mL/min/g). The study is intermediate risk in the setting of quantiative TID and presence of RV myocardial uptake.   LV perfusion is abnormal. Defect 1: There is a medium defect with moderate reduction in uptake present in the apical to mid anterolateral and inferolateral location(s) that is reversible. There is normal wall motion in the defect area. Consistent with ischemia.   Rest left ventricular function is normal. Rest EF: 61%. Stress left ventricular function is normal. Stress EF: 60%. End diastolic cavity size is normal. End systolic cavity size is normal.   Myocardial blood flow was computed to be 0.68ml/g/min at rest and 1.44ml/g/min at stress. Global myocardial blood flow reserve was 2.12 and was normal.   Coronary calcium assessment not performed due to prior revascularization. Aortic valve calcification noted. Mitral annular calcification noted.  A dual lead CIED is noted.  Right atrial lead terminates into the right atrial appendage.  Right ventricular lead terminates in the right ventricular apex.  Contained within  the SVC through its course.   Electronically Signed  By: Riley Lam M.D. CLINICAL DATA:  This over-read does not include  interpretation of cardiac or coronary anatomy or pathology. No interpretation the PET data set. The cardiac PET-CT interpretation by the cardiologist is attached. COMPARISON:  None Available. FINDINGS: Limited view of the lung parenchyma demonstrates no suspicious nodularity. Airways are normal. Limited view of the mediastinum demonstrates no adenopathy. Esophagus normal. Limited view of the upper abdomen unremarkable. Limited view of the skeleton and chest wall is unremarkable. IMPRESSION: No significant incidental findings on limited over-read CT of the chest. Electronically Signed   By: Genevive Bi M.D.   On: 07/27/2023 10:39  IR US CHEST  Result Date: 07/12/2023 CLINICAL DATA:  77 year old gentleman presented to interventional radiology for thoracentesis EXAM: CHEST ULTRASOUND COMPARISON:  None available FINDINGS: No significant pleural effusion.  Thoracentesis was not performed. IMPRESSION: No significant pleural effusion.  No thoracentesis was performed. Electronically Signed   By: Acquanetta Belling M.D.   On: 07/12/2023 14:08   IR Paracentesis  Result Date: 07/07/2023 INDICATION: 77 year old male with history of Gilbert's disease, new abdominal distention with ascites. Request for diagnostic and therapeutic paracentesis. EXAM: ULTRASOUND GUIDED DIAGNOSTIC AND THERAPEUTIC PARACENTESIS MEDICATIONS: 10 mL 1% lidocaine. COMPLICATIONS: None immediate. PROCEDURE: Informed written consent was obtained from the patient after a discussion of the risks, benefits and alternatives to treatment. A timeout was performed prior to the initiation of the procedure. Initial ultrasound scanning demonstrates a very small amount of ascites within the right lower abdominal quadrant. The right lower abdomen was prepped and draped in the usual sterile fashion. 1% lidocaine was used for local anesthesia. Following this, a 19 gauge, 7-cm, Yueh catheter was introduced. An ultrasound image was saved for documentation purposes. The  paracentesis was performed. The catheter was removed and a dressing was applied. The patient tolerated the procedure well without immediate post procedural complication. FINDINGS: A total of approximately 400 mL bright yellowfluid was removed. Samples were sent to the laboratory as requested by the clinical team. IMPRESSION: Successful ultrasound-guided paracentesis yielding 400 mL of peritoneal fluid. PLAN: If the patient eventually requires >/=2 paracenteses in a 30 day period, candidacy for formal evaluation by the [facility] will be assessed. Electronically Signed   By: Marliss Coots M.D.   On: 07/07/2023 11:23   CT ABDOMEN PELVIS W CONTRAST  Result Date: 07/01/2023 CLINICAL DATA:  Chronic epigastric pain and bloating. EXAM: CT ABDOMEN AND PELVIS WITH CONTRAST TECHNIQUE: Multidetector CT imaging of the abdomen and pelvis was performed using the standard protocol following bolus administration of intravenous contrast. RADIATION DOSE REDUCTION: This exam was performed according to the departmental dose-optimization program which includes automated exposure control, adjustment of the mA and/or kV according to patient size and/or use of iterative reconstruction technique. CONTRAST:  OMNIPAQUE IOHEXOL 300 MG/ML  SOLN COMPARISON:  01/28/2022 FINDINGS: Lower Chest: Increased size of moderate right pleural effusion. Hepatobiliary: No suspicious hepatic masses identified. Mild capsular nodularity and caudate hypertrophy are suspicious for cirrhosis. Recanalization of paraumbilical veins again seen, suspicious for portal venous hypertension. Gallbladder is unremarkable. No evidence of biliary ductal dilatation. Pancreas:  No mass or inflammatory changes. Spleen: No splenic lesions identified. Spleen remains at upper limits of normal in size. Adrenals/Urinary Tract: No suspicious masses identified. No evidence of ureteral calculi or hydronephrosis. Stomach/Bowel:  No evidence of obstruction or inflammatory process. Diverticulosis is seen mainly involving the sigmoid colon, however there is no  evidence of diverticulitis. Vascular/Lymphatic: Stable shotty sub-cm lymph nodes throughout the gastrohepatic ligament, porta hepatis, mesentery, and retroperitoneum, likely reactive or due to lymphedema. No pathologically enlarged lymph nodes. No acute vascular findings. Portosystemic venous collateral seen in the gastrosplenic and gastrohepatic ligaments, consistent with portal venous hypertension. Reproductive:  No mass or other significant abnormality. Other:  New moderate ascites and diffuse mesenteric edema. Musculoskeletal:  No suspicious bone lesions identified. IMPRESSION: Hepatic cirrhosis and findings of portal venous hypertension. No evidence of hepatic neoplasm. New moderate ascites and diffuse mesenteric edema. Increased size of moderate right pleural effusion. Colonic diverticulosis, without radiographic evidence of diverticulitis. Electronically Signed   By: Danae Orleans M.D.   On: 07/01/2023 14:53     Lab Results  Component Value Date   WBC 5.0 07/09/2023   HGB 13.7 07/09/2023   HCT 42.2 07/09/2023   MCV 85.5 07/09/2023   PLT 155.0 07/09/2023    Lab Results  Component Value Date   NA 139 07/20/2023   CL 100 07/20/2023   K 4.0 07/20/2023   CO2 29 07/20/2023   BUN 29 (H) 07/20/2023   CREATININE 1.26 07/20/2023   GFR 55.79 (L) 07/20/2023   CALCIUM 10.0 07/20/2023   ALBUMIN 4.7 07/09/2023   GLUCOSE 107 (H) 07/20/2023    Lab Results  Component Value Date   ALT 23 07/09/2023   AST 28 07/09/2023   ALKPHOS 147 (H) 07/09/2023   BILITOT 4.6 (H) 07/09/2023    Physical Exam: BP 118/62   Pulse 72   Ht 5\' 11"  (1.803 m)   Wt 166 lb (75.3 kg)   BMI 23.15 kg/m  Constitutional: Pleasant,well-developed, male in no acute distress. Extremities: no edema Neurological: Alert and oriented to person place and time. Psychiatric: Normal mood and affect.  Behavior is normal.   ASSESSMENT: 77 y.o. male here for assessment of the following  1. Cirrhosis of liver with ascites, unspecified hepatic cirrhosis type (HCC)   2. Fatty liver    Had a good discussion with the patient and his wife today about newly diagnosed cirrhosis.  This is obviously very unfortunate, his last imaging and workup for liver disease in years past showed no evidence of cirrhosis and his elastography was a low risk study.  Unfortunately it appears he has developed cirrhosis from fatty liver.  It does not appear he is drinking of alcohol to be this considered related to alcohol.  That being said we discussed alcohol abstinence and he is agreeable with that.  With a low-sodium diet and diuretic use, his ascites has completely resolved.  He is feeling well, has lost a lot of weight with diet and exercise.  He is happy with his progress.  We discussed cirrhosis, risks for decompensation moving forward and risks for HCC.  I think his bilirubin is mostly elevated due to his history of Gilbert's but we will keep an eye on this.  I think okay to slowly titrate down his Lasix use, can go to 1 tablet/day, next day go to 2 tabs day, and so forth until he can get on the lowest daily dose needed to control symptoms.  I will repeat lab work in 1 to 2 weeks to make sure his kidney function stays the same and that his potassium is stable on Lasix monotherapy, it has been so far.  It sounds like they have a very aggressive with low-sodium diet and is having no sodium currently.  His food is very bland and he is lost his appetite.  I told him he can have up to 2 g of sodium per day, would use salt substitute when able to.  He is agreeable to this.  I reviewed his medication list.  I think we should reduce his Lipitor dosing to 20 mg daily given his decompensated liver disease, he will discuss with his cardiologist.  He will also discuss his cardiac PET scan with his cardiology team as well to  determine if he needs any further workup.  I do not feel strongly he needs an EGD right now, especially that he needs cardiology follow-up, but may consider it at the end of the year, 1 year from his last exam, to intervally screen for varices in the setting of anticoagulation.  His platelet count seems okay currently.  Otherwise due for routine labs and right upper quadrant ultrasound in January.  I will see him in 3 to 4 months for reassessment, if any worsening or changes in the interim he will contact me.  He agrees  PLAN: - low Na diet - < 2gm / day- handout given - he can liberalize this a little compared to what he is doing now - taper lasix as tolerated, use lowest dose needed to control ascites / edema. Significantly improved thus far - CMET and AFP in 1-2 weeks - cut lipitor dosing to 20mg  / day given his liver disease and discuss wth cardiology - recall for RUQ [place] in January - may consider repeat EGD at the end of the year, needs cardiology evaluation of PET scan first - f/u 3-4 months or contact me sooner with issues.  Harlin Rain, MD [facility] Gastroenterology

## 2023-08-03 DIAGNOSIS — L570 Actinic keratosis: Secondary | ICD-10-CM | POA: Diagnosis not present

## 2023-08-03 DIAGNOSIS — L812 Freckles: Secondary | ICD-10-CM | POA: Diagnosis not present

## 2023-08-03 DIAGNOSIS — D692 Other nonthrombocytopenic purpura: Secondary | ICD-10-CM | POA: Diagnosis not present

## 2023-08-03 DIAGNOSIS — Z85828 Personal history of other malignant neoplasm of skin: Secondary | ICD-10-CM | POA: Diagnosis not present

## 2023-08-03 DIAGNOSIS — D1801 Hemangioma of skin and subcutaneous tissue: Secondary | ICD-10-CM | POA: Diagnosis not present

## 2023-08-03 DIAGNOSIS — L820 Inflamed seborrheic keratosis: Secondary | ICD-10-CM | POA: Diagnosis not present

## 2023-08-03 DIAGNOSIS — L821 Other seborrheic keratosis: Secondary | ICD-10-CM | POA: Diagnosis not present

## 2023-08-06 ENCOUNTER — Other Ambulatory Visit (INDEPENDENT_AMBULATORY_CARE_PROVIDER_SITE_OTHER): Payer: PPO

## 2023-08-06 ENCOUNTER — Other Ambulatory Visit: Payer: Self-pay | Admitting: Gastroenterology

## 2023-08-06 DIAGNOSIS — K7460 Unspecified cirrhosis of liver: Secondary | ICD-10-CM | POA: Diagnosis not present

## 2023-08-06 DIAGNOSIS — R188 Other ascites: Secondary | ICD-10-CM

## 2023-08-06 DIAGNOSIS — K760 Fatty (change of) liver, not elsewhere classified: Secondary | ICD-10-CM | POA: Diagnosis not present

## 2023-08-06 LAB — COMPREHENSIVE METABOLIC PANEL
ALT: 39 U/L (ref 0–53)
AST: 31 U/L (ref 0–37)
Albumin: 4.2 g/dL (ref 3.5–5.2)
Alkaline Phosphatase: 111 U/L (ref 39–117)
BUN: 25 mg/dL — ABNORMAL HIGH (ref 6–23)
CO2: 30 meq/L (ref 19–32)
Calcium: 9.9 mg/dL (ref 8.4–10.5)
Chloride: 103 meq/L (ref 96–112)
Creatinine, Ser: 1.23 mg/dL (ref 0.40–1.50)
GFR: 57.41 mL/min — ABNORMAL LOW (ref >60.00)
Glucose, Bld: 93 mg/dL (ref 70–99)
Potassium: 3.9 meq/L (ref 3.5–5.1)
Sodium: 140 meq/L (ref 135–145)
Total Bilirubin: 3.7 mg/dL — ABNORMAL HIGH (ref 0.2–1.2)
Total Protein: 7.1 g/dL (ref 6.0–8.3)

## 2023-08-09 LAB — AFP TUMOR MARKER: AFP-Tumor Marker: 2.1 ng/mL (ref <6.1)

## 2023-08-19 ENCOUNTER — Telehealth: Payer: Self-pay | Admitting: Gastroenterology

## 2023-08-19 MED ORDER — FUROSEMIDE 20 MG PO TABS: ORAL_TABLET | ORAL | 3 refills | Status: DC

## 2023-08-19 NOTE — Telephone Encounter (Signed)
Please advise on request for lasix 20 mg a day. Last visit indicates a request to taper lasix. Please advise. Thank you

## 2023-08-19 NOTE — Telephone Encounter (Signed)
Inbound call from patient is requesting lasix 20 mg to be filled to [facility]. States the medication was being filled by his PCP but Dr. Adela Lank stated it could be filled by him.

## 2023-08-19 NOTE — Addendum Note (Signed)
Addended by: Alexis Frock on: 08/19/2023 04:26 PM   Modules accepted: Orders

## 2023-08-20 MED ORDER — FUROSEMIDE 20 MG PO TABS: ORAL_TABLET | ORAL | 3 refills | Status: AC

## 2023-08-20 NOTE — Telephone Encounter (Signed)
Rx has been sent as requested.

## 2023-08-20 NOTE — Addendum Note (Signed)
Addended by: Alexis Frock on: 08/20/2023 10:42 AM   Modules accepted: Orders

## 2023-08-26 ENCOUNTER — Encounter: Payer: Self-pay | Admitting: Internal Medicine

## 2023-08-26 ENCOUNTER — Ambulatory Visit (INDEPENDENT_AMBULATORY_CARE_PROVIDER_SITE_OTHER): Payer: PPO | Admitting: Internal Medicine

## 2023-08-26 VITALS — BP 118/70 | HR 48 | Temp 97.6°F | Ht 71.0 in | Wt 166.4 lb

## 2023-08-26 DIAGNOSIS — R0602 Shortness of breath: Secondary | ICD-10-CM | POA: Diagnosis not present

## 2023-08-26 DIAGNOSIS — Z23 Encounter for immunization: Secondary | ICD-10-CM

## 2023-08-26 DIAGNOSIS — R7989 Other specified abnormal findings of blood chemistry: Secondary | ICD-10-CM | POA: Diagnosis not present

## 2023-08-26 DIAGNOSIS — K7460 Unspecified cirrhosis of liver: Secondary | ICD-10-CM | POA: Diagnosis not present

## 2023-08-26 DIAGNOSIS — E785 Hyperlipidemia, unspecified: Secondary | ICD-10-CM

## 2023-08-26 DIAGNOSIS — I10 Essential (primary) hypertension: Secondary | ICD-10-CM

## 2023-08-26 DIAGNOSIS — I25810 Atherosclerosis of coronary artery bypass graft(s) without angina pectoris: Secondary | ICD-10-CM | POA: Diagnosis not present

## 2023-08-26 NOTE — Patient Instructions (Signed)
Milk thistle

## 2023-08-26 NOTE — Progress Notes (Signed)
Subjective:  Patient ID: Russell Griffith, male    DOB: Dec 23, 1945  Age: 77 y.o. MRN: [id number]  CC: No chief complaint on file.   HPI Russell Griffith presents for a fatty liver that has developed into cirrhosis, ascites w/400 ml fluid drawn by paracentesis... Ed lost wt, on diet, less fat, less salt in the diet- no abd pain over past several months.  Feeling well.  Exercising.  Outpatient Medications Prior to Visit  Medication Sig Dispense Refill   acetaminophen (TYLENOL) 500 MG tablet Take 1,000 mg by mouth every 6 (six) hours as needed for moderate pain, mild pain or headache.     atorvastatin (LIPITOR) 40 MG tablet Take 0.5 tablets (20 mg total) by mouth daily.     Carboxymethylcellulose Sodium (DRY EYE RELIEF OP) Place 1 drop into both eyes daily as needed (Dry eye).     Cholecalciferol 25 MCG (1000 UT) capsule Take 1,000 Units by mouth daily with supper.      ELIQUIS 5 MG TABS tablet TAKE ONE TABLET BY MOUTH TWICE DAILY 60 tablet 11   furosemide (LASIX) 20 MG tablet Take 1 tablet once daily as needed 90 tablet 3   isosorbide mononitrate (IMDUR) 60 MG 24 hr tablet Take 1 tablet (60 mg total) by mouth daily. 90 tablet 3   losartan (COZAAR) 25 MG tablet TAKE ONE TABLET BY MOUTH DAILY 90 tablet 3   nitroGLYCERIN (NITROSTAT) 0.4 MG SL tablet Place 1 tablet (0.4 mg total) under the tongue every 5 (five) minutes as needed. 25 tablet 2   NONFORMULARY OR COMPOUNDED ITEM Apply 1 application topically daily as needed (rash). Cetaphil + triamcinolone cream     pantoprazole (PROTONIX) 40 MG tablet Take 1 tablet (40 mg total) by mouth 2 (two) times daily. (Patient taking differently: Take 40 mg by mouth daily.) 180 tablet 1   triamcinolone cream (KENALOG) 0.5 % Apply 1 Application topically 3 (three) times daily. 30 g 1   No facility-administered medications prior to visit.    ROS: Review of Systems  Constitutional:  Negative for appetite change, fatigue and unexpected weight change.   HENT:  Negative for congestion, nosebleeds, sneezing, sore throat and trouble swallowing.   Eyes:  Negative for itching and visual disturbance.  Respiratory:  Negative for cough.   Cardiovascular:  Negative for chest pain, palpitations and leg swelling.  Gastrointestinal:  Negative for abdominal distention, blood in stool, diarrhea and nausea.  Genitourinary:  Negative for frequency and hematuria.  Musculoskeletal:  Negative for back pain, gait problem, joint swelling and neck pain.  Skin:  Negative for rash.  Neurological:  Negative for dizziness, tremors, speech difficulty and weakness.  Psychiatric/Behavioral:  Negative for agitation, dysphoric mood and sleep disturbance. The patient is not nervous/anxious.     Objective:  BP 118/70 (BP Location: Left Arm, Patient Position: Sitting, Cuff Size: Normal)   Pulse (!) 48   Temp 97.6 F (36.4 C) (Oral)   Ht 5\' 11"  (1.803 m)   Wt 166 lb 6.4 oz (75.5 kg)   SpO2 97%   BMI 23.21 kg/m   BP Readings from Last 3 Encounters:  08/26/23 118/70  07/28/23 118/62  07/27/23 (!) 116/49    Wt Readings from Last 3 Encounters:  08/26/23 166 lb 6.4 oz (75.5 kg)  07/28/23 166 lb (75.3 kg)  06/08/23 184 lb 8 oz (83.7 kg)    Physical Exam Constitutional:      General: He is not in acute distress.  Appearance: He is well-developed.     Comments: NAD  Eyes:     Conjunctiva/sclera: Conjunctivae normal.     Pupils: Pupils are equal, round, and reactive to light.  Neck:     Thyroid: No thyromegaly.     Vascular: No JVD.  Cardiovascular:     Rate and Rhythm: Normal rate and regular rhythm.     Heart sounds: Normal heart sounds. No murmur heard.    No friction rub. No gallop.  Pulmonary:     Effort: Pulmonary effort is normal. No respiratory distress.     Breath sounds: Normal breath sounds. No wheezing or rales.  Chest:     Chest wall: No tenderness.  Abdominal:     General: Bowel sounds are normal. There is no distension.      Palpations: Abdomen is soft. There is no mass.     Tenderness: There is no abdominal tenderness. There is no guarding or rebound.  Musculoskeletal:        General: No tenderness. Normal range of motion.     Cervical back: Normal range of motion.  Lymphadenopathy:     Cervical: No cervical adenopathy.  Skin:    General: Skin is warm and dry.     Findings: No rash.  Neurological:     Mental Status: He is alert and oriented to person, place, and time.     Cranial Nerves: No cranial nerve deficit.     Motor: No abnormal muscle tone.     Coordination: Coordination normal.     Gait: Gait normal.     Deep Tendon Reflexes: Reflexes are normal and symmetric.  Psychiatric:        Behavior: Behavior normal.        Thought Content: Thought content normal.        Judgment: Judgment normal.     Lab Results  Component Value Date   WBC 5.0 07/09/2023   HGB 13.7 07/09/2023   HCT 42.2 07/09/2023   PLT 155.0 07/09/2023   GLUCOSE 93 08/06/2023   CHOL 83 (L) 04/22/2023   TRIG 52 04/22/2023   HDL 31 (L) 04/22/2023   LDLCALC 39 04/22/2023   ALT 39 08/06/2023   AST 31 08/06/2023   NA 140 08/06/2023   K 3.9 08/06/2023   CL 103 08/06/2023   CREATININE 1.23 08/06/2023   BUN 25 (H) 08/06/2023   CO2 30 08/06/2023   TSH 6.04 (H) 04/14/2023   PSA 0.81 09/29/2021   INR 1.5 (H) 06/06/2022   HGBA1C 5.7 03/11/2023    NM PET CT CARDIAC PERFUSION MULTI W/ABSOLUTE BLOODFLOW  Result Date: 07/27/2023   Findings are consistent with mild (< 10% myocardium) apical-mid lateral ischemia.  There is associated decrease in LCX stress flow (1.15 mL/min/g). The study is intermediate risk in the setting of quantiative TID and presence of RV myocardial uptake.   LV perfusion is abnormal. Defect 1: There is a medium defect with moderate reduction in uptake present in the apical to mid anterolateral and inferolateral location(s) that is reversible. There is normal wall motion in the defect area. Consistent with  ischemia.   Rest left ventricular function is normal. Rest EF: 61%. Stress left ventricular function is normal. Stress EF: 60%. End diastolic cavity size is normal. End systolic cavity size is normal.   Myocardial blood flow was computed to be 0.40ml/g/min at rest and 1.57ml/g/min at stress. Global myocardial blood flow reserve was 2.12 and was normal.   Coronary calcium assessment not performed due  to prior revascularization. Aortic valve calcification noted. Mitral annular calcification noted.  A dual lead CIED is noted.  Right atrial lead terminates into the right atrial appendage.  Right ventricular lead terminates in the right ventricular apex.  Contained within the SVC through its course.   Electronically Signed  By: Riley Lam M.D. CLINICAL DATA:  This over-read does not include interpretation of cardiac or coronary anatomy or pathology. No interpretation the PET data set. The cardiac PET-CT interpretation by the cardiologist is attached. COMPARISON:  None Available. FINDINGS: Limited view of the lung parenchyma demonstrates no suspicious nodularity. Airways are normal. Limited view of the mediastinum demonstrates no adenopathy. Esophagus normal. Limited view of the upper abdomen unremarkable. Limited view of the skeleton and chest wall is unremarkable. IMPRESSION: No significant incidental findings on limited over-read CT of the chest. Electronically Signed   By: Genevive Bi M.D.   On: 07/27/2023 10:39   Assessment & Plan:   Problem List Items Addressed This Visit     HTN (hypertension)    BP Readings from Last 3 Encounters:  08/26/23 118/70  07/28/23 118/62  07/27/23 (!) 116/49         CAD (coronary artery disease)    No chest pain.  Good exercise tolerance.  Status post CABG x5 2000 On Eliquis, Lipitor (Lipitor dose was reduced due to liver cirrhosis) Myoview March, 2011, excellent exercise, hypertensive response, no scar or ischemia, EF 61%, mild palpitations peak stress  with infrequent PACs and PVCs. //  Nuclear, November, 2013, no ischemia, study was not gated.      Shortness of breath    Resolved with furosemide and liver cirrhosis treatment We can start to reduce furosemide      Dyslipidemia    Lipitor dose was reduced in half due to liver cirrhosis      Abnormal LFTs    Due to fatty liver/liver cirrhosis diagnosed in 2024 - improved labs Lipitor dose was reduced in half due to liver cirrhosis      Cirrhosis of liver (HCC)    New.  Follow-up with Dr. Adela Lank.  A fatty liver as a primary cause that has developed into cirrhosis, ascites w/400 ml fluid drawn by paracentesis... Ed lost wt, on diet, less fat, less salt in the diet, no alcohol - no abd pain over past several months.  Feeling well.  Exercising. Of note, his abdominal CT in 2023 did not show any cirrhosis of the liver.       Other Visit Diagnoses     Need for vaccination    -  Primary   Relevant Orders   Flu Vaccine Trivalent High Dose (Fluad) (Completed)         No orders of the defined types were placed in this encounter.     Follow-up: Return in about 6 months (around 02/23/2024) for a follow-up visit.  Sonda Primes, MD

## 2023-08-27 DIAGNOSIS — K7460 Unspecified cirrhosis of liver: Secondary | ICD-10-CM | POA: Insufficient documentation

## 2023-08-27 NOTE — Assessment & Plan Note (Signed)
New.  Follow-up with Dr. Adela Lank.  A fatty liver as a primary cause that has developed into cirrhosis, ascites w/400 ml fluid drawn by paracentesis... Ed lost wt, on diet, less fat, less salt in the diet, no alcohol - no abd pain over past several months.  Feeling well.  Exercising. Of note, his abdominal CT in 2023 did not show any cirrhosis of the liver.

## 2023-08-27 NOTE — Assessment & Plan Note (Signed)
Due to fatty liver/liver cirrhosis diagnosed in 2024 - improved labs Lipitor dose was reduced in half due to liver cirrhosis

## 2023-08-27 NOTE — Assessment & Plan Note (Signed)
BP Readings from Last 3 Encounters:  08/26/23 118/70  07/28/23 118/62  07/27/23 (!) 116/49

## 2023-08-27 NOTE — Assessment & Plan Note (Signed)
Resolved with furosemide and liver cirrhosis treatment We can start to reduce furosemide

## 2023-08-27 NOTE — Assessment & Plan Note (Signed)
No chest pain.  Good exercise tolerance.  Status post CABG x5 2000 On Eliquis, Lipitor (Lipitor dose was reduced due to liver cirrhosis) Myoview March, 2011, excellent exercise, hypertensive response, no scar or ischemia, EF 61%, mild palpitations peak stress with infrequent PACs and PVCs. //  Nuclear, November, 2013, no ischemia, study was not gated.

## 2023-08-27 NOTE — Assessment & Plan Note (Signed)
Lipitor dose was reduced in half due to liver cirrhosis

## 2023-08-30 ENCOUNTER — Telehealth: Payer: Self-pay

## 2023-08-30 DIAGNOSIS — H43811 Vitreous degeneration, right eye: Secondary | ICD-10-CM | POA: Diagnosis not present

## 2023-08-30 NOTE — Telephone Encounter (Signed)
-----   Message from [facility] sent at 08/06/2023  2:49 PM EDT ----- Please remind him to come for a BMET around 09/06/2023. Order in epic.

## 2023-08-30 NOTE — Telephone Encounter (Signed)
MyChart message sent to patient to go to lab.

## 2023-08-31 ENCOUNTER — Other Ambulatory Visit (INDEPENDENT_AMBULATORY_CARE_PROVIDER_SITE_OTHER): Payer: PPO

## 2023-08-31 DIAGNOSIS — K7460 Unspecified cirrhosis of liver: Secondary | ICD-10-CM | POA: Diagnosis not present

## 2023-08-31 DIAGNOSIS — H35431 Paving stone degeneration of retina, right eye: Secondary | ICD-10-CM | POA: Diagnosis not present

## 2023-08-31 DIAGNOSIS — R188 Other ascites: Secondary | ICD-10-CM | POA: Diagnosis not present

## 2023-08-31 DIAGNOSIS — H43391 Other vitreous opacities, right eye: Secondary | ICD-10-CM | POA: Diagnosis not present

## 2023-08-31 DIAGNOSIS — H4311 Vitreous hemorrhage, right eye: Secondary | ICD-10-CM | POA: Diagnosis not present

## 2023-08-31 DIAGNOSIS — Z961 Presence of intraocular lens: Secondary | ICD-10-CM | POA: Diagnosis not present

## 2023-08-31 DIAGNOSIS — H43811 Vitreous degeneration, right eye: Secondary | ICD-10-CM | POA: Diagnosis not present

## 2023-08-31 LAB — BASIC METABOLIC PANEL
BUN: 22 mg/dL (ref 6–23)
CO2: 27 meq/L (ref 19–32)
Calcium: 9.5 mg/dL (ref 8.4–10.5)
Chloride: 105 meq/L (ref 96–112)
Creatinine, Ser: 1.15 mg/dL (ref 0.40–1.50)
GFR: 62.2 mL/min (ref >60.00)
Glucose, Bld: 90 mg/dL (ref 70–99)
Potassium: 3.9 meq/L (ref 3.5–5.1)
Sodium: 141 meq/L (ref 135–145)

## 2023-09-09 ENCOUNTER — Ambulatory Visit (INDEPENDENT_AMBULATORY_CARE_PROVIDER_SITE_OTHER): Payer: PPO

## 2023-09-09 DIAGNOSIS — I495 Sick sinus syndrome: Secondary | ICD-10-CM | POA: Diagnosis not present

## 2023-09-13 ENCOUNTER — Encounter: Payer: Self-pay | Admitting: Gastroenterology

## 2023-09-13 DIAGNOSIS — H43811 Vitreous degeneration, right eye: Secondary | ICD-10-CM | POA: Diagnosis not present

## 2023-09-13 DIAGNOSIS — H4311 Vitreous hemorrhage, right eye: Secondary | ICD-10-CM | POA: Diagnosis not present

## 2023-09-13 DIAGNOSIS — H43391 Other vitreous opacities, right eye: Secondary | ICD-10-CM | POA: Diagnosis not present

## 2023-09-13 DIAGNOSIS — Z961 Presence of intraocular lens: Secondary | ICD-10-CM | POA: Diagnosis not present

## 2023-09-13 DIAGNOSIS — H33101 Unspecified retinoschisis, right eye: Secondary | ICD-10-CM | POA: Diagnosis not present

## 2023-09-14 LAB — CUP PACEART REMOTE DEVICE CHECK
Battery Remaining Longevity: 173 mo
Battery Voltage: 3.16 V
Brady Statistic AP VP Percent: 0 %
Brady Statistic AP VS Percent: 0 %
Brady Statistic AS VP Percent: 5.79 %
Brady Statistic AS VS Percent: 94.21 %
Brady Statistic RA Percent Paced: 0 %
Brady Statistic RV Percent Paced: 5.79 %
Date Time Interrogation Session: 20241012171339
Implantable Lead Connection Status: 753985
Implantable Lead Connection Status: 753985
Implantable Lead Implant Date: 20130816
Implantable Lead Implant Date: 20130816
Implantable Lead Location: 753859
Implantable Lead Location: 753860
Implantable Lead Model: 5076
Implantable Lead Model: 5076
Implantable Pulse Generator Implant Date: 20231011
Lead Channel Impedance Value: 304 Ohm
Lead Channel Impedance Value: 456 Ohm
Lead Channel Impedance Value: 646 Ohm
Lead Channel Impedance Value: 874 Ohm
Lead Channel Pacing Threshold Amplitude: 2.5 V
Lead Channel Pacing Threshold Pulse Width: 0.4 ms
Lead Channel Sensing Intrinsic Amplitude: 0.375 mV
Lead Channel Sensing Intrinsic Amplitude: 14.5 mV
Lead Channel Sensing Intrinsic Amplitude: 14.5 mV
Lead Channel Setting Pacing Amplitude: 2.5 V
Lead Channel Setting Pacing Pulse Width: 1 ms
Lead Channel Setting Sensing Sensitivity: 0.9 mV
Zone Setting Status: 755011

## 2023-09-22 NOTE — Progress Notes (Signed)
Remote pacemaker transmission.   

## 2023-09-27 DIAGNOSIS — H43811 Vitreous degeneration, right eye: Secondary | ICD-10-CM | POA: Diagnosis not present

## 2023-09-27 DIAGNOSIS — H35431 Paving stone degeneration of retina, right eye: Secondary | ICD-10-CM | POA: Diagnosis not present

## 2023-09-27 DIAGNOSIS — H43391 Other vitreous opacities, right eye: Secondary | ICD-10-CM | POA: Diagnosis not present

## 2023-09-27 DIAGNOSIS — H35451 Secondary pigmentary degeneration, right eye: Secondary | ICD-10-CM | POA: Diagnosis not present

## 2023-09-27 DIAGNOSIS — H33101 Unspecified retinoschisis, right eye: Secondary | ICD-10-CM | POA: Diagnosis not present

## 2023-09-27 DIAGNOSIS — H04123 Dry eye syndrome of bilateral lacrimal glands: Secondary | ICD-10-CM | POA: Diagnosis not present

## 2023-09-29 DIAGNOSIS — Z950 Presence of cardiac pacemaker: Secondary | ICD-10-CM | POA: Insufficient documentation

## 2023-10-01 ENCOUNTER — Encounter: Payer: Self-pay | Admitting: Internal Medicine

## 2023-10-01 ENCOUNTER — Ambulatory Visit: Payer: PPO | Attending: Internal Medicine | Admitting: Internal Medicine

## 2023-10-01 VITALS — BP 104/62 | HR 57 | Ht 71.0 in | Wt 170.6 lb

## 2023-10-01 DIAGNOSIS — Z950 Presence of cardiac pacemaker: Secondary | ICD-10-CM | POA: Diagnosis not present

## 2023-10-01 DIAGNOSIS — I495 Sick sinus syndrome: Secondary | ICD-10-CM | POA: Diagnosis not present

## 2023-10-01 DIAGNOSIS — I4821 Permanent atrial fibrillation: Secondary | ICD-10-CM

## 2023-10-01 DIAGNOSIS — I5032 Chronic diastolic (congestive) heart failure: Secondary | ICD-10-CM

## 2023-10-01 LAB — CUP PACEART INCLINIC DEVICE CHECK
Battery Remaining Longevity: 172 mo
Battery Voltage: 3.15 V
Brady Statistic AP VP Percent: 0 %
Brady Statistic AP VS Percent: 0 %
Brady Statistic AS VP Percent: 2.87 %
Brady Statistic AS VS Percent: 97.13 %
Brady Statistic RA Percent Paced: 0 %
Brady Statistic RV Percent Paced: 2.87 %
Date Time Interrogation Session: 20241101154525
Implantable Lead Connection Status: 753985
Implantable Lead Connection Status: 753985
Implantable Lead Implant Date: 20130816
Implantable Lead Implant Date: 20130816
Implantable Lead Location: 753859
Implantable Lead Location: 753860
Implantable Lead Model: 5076
Implantable Lead Model: 5076
Implantable Pulse Generator Implant Date: 20231011
Lead Channel Impedance Value: 323 Ohm
Lead Channel Impedance Value: 475 Ohm
Lead Channel Impedance Value: 665 Ohm
Lead Channel Impedance Value: 893 Ohm
Lead Channel Pacing Threshold Amplitude: 2 V
Lead Channel Pacing Threshold Amplitude: 2.5 V
Lead Channel Pacing Threshold Pulse Width: 0.4 ms
Lead Channel Pacing Threshold Pulse Width: 1 ms
Lead Channel Sensing Intrinsic Amplitude: 0 mV
Lead Channel Sensing Intrinsic Amplitude: 0.375 mV
Lead Channel Sensing Intrinsic Amplitude: 14.125 mV
Lead Channel Sensing Intrinsic Amplitude: 17.5 mV
Lead Channel Setting Pacing Amplitude: 2.5 V
Lead Channel Setting Pacing Pulse Width: 1 ms
Lead Channel Setting Sensing Sensitivity: 0.9 mV
Zone Setting Status: 755011

## 2023-10-01 NOTE — Progress Notes (Signed)
Patient Care Team: [name], Georgina Quint, MD as PCP - General (Internal Medicine) Duke Salvia, MD as PCP - Electrophysiology (Cardiology) Rollene Rotunda, MD as PCP - Cardiology (Cardiology) Enid Baas, MD (Family Medicine) Duke Salvia, MD (Cardiology) Donzetta Starch, MD as Consulting Physician (Dermatology) Rollene Rotunda, MD as Consulting Physician (Cardiology) Marcine Matar, MD as Consulting Physician (Urology) Maris Berger, MD as Consulting Physician (Ophthalmology) Armbruster, Willaim Rayas, MD as Consulting Physician (Gastroenterology)   HPI  Russell Griffith is a 77 y.o. male for pacemaker implantation for significant pausing in the context of now permanent atrial fibrillation and previous sinus node dysfunction  Hx pulmonary vein isolation at [facility] 2/20.    Coronary artery disease with bypass surgery 2000 and a Myoview November 2013 demonstrating no ischemia or scar. LV function was normal by echo February 2014 Had a history of exercise-induced polymorphic ventricular tachycardia, prompting  catheterization which demonstrated occlusion of 1 of his vein grafts but other conduits were open  Interval diagnosis NASH--with 25 lb wieght loss, feels much better.  His statin dose was reduced from 40--20, he wonders about that.  No bleeding on the Eliquis  The patient denies chest pain, shortness of breath, nocturnal dyspnea, orthopnea or peripheral edema.  There have been no palpitations, lightheadedness or syncope.   DATE TEST EF   2/19 LHC  55-60 % LIMA-LAD/SVG-D, SVG-PDA Patent SVG AM-OM2  12/19 Echo  60-65% LAE 41/2.0/33         Date Cr K Hgb  9/23 1.09 3.8 10.8<<6.6  1/24 1.02 3.3 12.6   1  Previously implanted device in case treatment was sinus node dysfunction.  He unfortunately underwent a battery failure to Default resulting in ventricular pacing at 74 and we elected to change out the device 10/23.  He is now in atrial fibrillation permanently and  ventricularly paces almost never      Has been seen by Dr. Merita Norton for Watchman in light of ongoing anemia issues.  He has been on iron replacement and has had discussions with Dr. [facility] who said that he would need to stay on antiplatelet therapy specifically Plavix following watchman.  He then asked the question if I have to stay on blood thinners why do the procedure with its attendant risks.              Past Medical History:  Diagnosis Date   Anemia    on Accrufer   Anxiety    Aortic stenosis    Mild, echo, April, 2014   Blood transfusion without reported diagnosis 2023   BPH (benign prostatic hyperplasia)    CAD (coronary artery disease)    a. s/p CABG   Carotid artery disease (HCC)    Cataract 2020   bilateral sx   Cirrhosis of liver (HCC)    Colonic polyp    Diverticulosis    Elevated bilirubin    Mild chronic elevation, 2.0 January, 2011 stable   GERD (gastroesophageal reflux disease)    Barrett's esophagus-on meds   Gilbert's disease    History of kidney stones    HTN (hypertension)    on meds   Hyperlipidemia    Low HDL-on meds   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HCC)    Left  lung chest x-ray July, 2013   Persistent atrial fibrillation (HCC)    a. s/p PVI at [facility]   Precancerous lesion    Forehead   Primary osteoarthritis of left knee    Mild-bilateral LE   Prolapsed internal  hemorrhoids, grade 3 08/12/2015   PVC's (premature ventricular contractions)    Seasonal allergies    Tubular adenoma of colon     Past Surgical History:  Procedure Laterality Date   ABLATION     PVI at [facility]   ATRIAL FIBRILLATION ABLATION N/A 01/25/2019   Procedure: ATRIAL FIBRILLATION ABLATION;  Surgeon: Hillis Range, MD;  Location: [facility] INVASIVE CV LAB;  Service: Cardiovascular;  Laterality: N/A;   CARDIOVERSION N/A 10/14/2018   Procedure: CARDIOVERSION;  Surgeon: Pricilla Riffle, MD;  Location: [facility] ENDOSCOPY;  Service: Cardiovascular;  Laterality: N/A;   CARDIOVERSION N/A 08/18/2019   Procedure:  CARDIOVERSION;  Surgeon: [name] Bathe, MD;  Location: [facility] ENDOSCOPY;  Service: Cardiovascular;  Laterality: N/A;   COLONOSCOPY     CORONARY ARTERY BYPASS GRAFT  2000   CABG X5   CORONARY PRESSURE/FFR STUDY N/A 01/05/2018   Procedure: INTRAVASCULAR PRESSURE WIRE/FFR STUDY;  Surgeon: Tonny Bollman, MD;  Location: [facility] INVASIVE CV LAB;  Service: Cardiovascular;  Laterality: N/A;   CORONARY STENT INTERVENTION N/A 05/22/2022   Procedure: CORONARY STENT INTERVENTION;  Surgeon: Corky Crafts, MD;  Location: [facility] INVASIVE CV LAB;  Service: Cardiovascular;  Laterality: N/A;   CORONARY ULTRASOUND/IVUS N/A 05/22/2022   Procedure: Intravascular Ultrasound/IVUS;  Surgeon: Corky Crafts, MD;  Location: [facility] INVASIVE CV LAB;  Service: Cardiovascular;  Laterality: N/A;   CYSTOSCOPY WITH RETROGRADE PYELOGRAM, URETEROSCOPY AND STENT PLACEMENT Bilateral 09/04/2020   Procedure: CYSTOSCOPY WITH BILATERAL RETROGRADE PYELOGRAM, RIGHT URETEROSCOPY AND RIGHT  STENT PLACEMENT;  Surgeon: Marcine Matar, MD;  Location: [facility] ORS;  Service: Urology;  Laterality: Bilateral;   ENTEROSCOPY N/A 06/06/2022   Procedure: ENTEROSCOPY;  Surgeon: Benancio Deeds, MD;  Location: [facility] ENDOSCOPY;  Service: Gastroenterology;  Laterality: N/A;   ESOPHAGOGASTRODUODENOSCOPY     HEAD & NECK SKIN LESION EXCISIONAL BIOPSY     HEMORRHOID BANDING     HEMOSTASIS CLIP PLACEMENT  06/06/2022   Procedure: HEMOSTASIS CLIP PLACEMENT;  Surgeon: Benancio Deeds, MD;  Location: [facility] ENDOSCOPY;  Service: Gastroenterology;;   HOT HEMOSTASIS N/A 06/06/2022   Procedure: HOT HEMOSTASIS (ARGON PLASMA COAGULATION/BICAP);  Surgeon: Benancio Deeds, MD;  Location: [facility] ENDOSCOPY;  Service: Gastroenterology;  Laterality: N/A;   IR PARACENTESIS  07/07/2023   LEFT HEART CATH AND CORS/GRAFTS ANGIOGRAPHY N/A 01/05/2018   Procedure: LEFT HEART CATH AND CORS/GRAFTS ANGIOGRAPHY;  Surgeon: Tonny Bollman, MD;  Location: [facility] INVASIVE CV LAB;  Service:  Cardiovascular;  Laterality: N/A;   PERMANENT PACEMAKER INSERTION N/A 07/15/2012   Procedure: PERMANENT PACEMAKER INSERTION;  Surgeon: Duke Salvia, MD;  Location: [facility] CATH LAB;  Service: Cardiovascular;  Laterality: N/A;   PPM GENERATOR CHANGEOUT N/A 09/09/2022   Procedure: PPM GENERATOR CHANGEOUT;  Surgeon: Duke Salvia, MD;  Location: [facility] INVASIVE CV LAB;  Service: Cardiovascular;  Laterality: N/A;   RIGHT/LEFT HEART CATH AND CORONARY/GRAFT ANGIOGRAPHY N/A 05/22/2022   Procedure: RIGHT/LEFT HEART CATH AND CORONARY/GRAFT ANGIOGRAPHY;  Surgeon: Corky Crafts, MD;  Location: [facility] INVASIVE CV LAB;  Service: Cardiovascular;  Laterality: N/A;   rotator cuff surgery Right 2017   SCLEROTHERAPY  06/06/2022   Procedure: SCLEROTHERAPY;  Surgeon: Benancio Deeds, MD;  Location: [facility] ENDOSCOPY;  Service: Gastroenterology;;   TONSILLECTOMY  1956   WISDOM TOOTH EXTRACTION      Current Outpatient Medications  Medication Sig Dispense Refill   acetaminophen (TYLENOL) 500 MG tablet Take 1,000 mg by mouth every 6 (six) hours as needed for moderate pain, mild pain or headache.     atorvastatin (  LIPITOR) 40 MG tablet Take 0.5 tablets (20 mg total) by mouth daily.     Carboxymethylcellulose Sodium (DRY EYE RELIEF OP) Place 1 drop into both eyes daily as needed (Dry eye).     Cholecalciferol 25 MCG (1000 UT) capsule Take 1,000 Units by mouth daily with supper.      ELIQUIS 5 MG TABS tablet TAKE ONE TABLET BY MOUTH TWICE DAILY 60 tablet 11   furosemide (LASIX) 20 MG tablet Take 1 tablet once daily as needed 90 tablet 3   isosorbide mononitrate (IMDUR) 60 MG 24 hr tablet Take 1 tablet (60 mg total) by mouth daily. 90 tablet 3   losartan (COZAAR) 25 MG tablet TAKE ONE TABLET BY MOUTH DAILY 90 tablet 3   nitroGLYCERIN (NITROSTAT) 0.4 MG SL tablet Place 1 tablet (0.4 mg total) under the tongue every 5 (five) minutes as needed. 25 tablet 2   NONFORMULARY OR COMPOUNDED ITEM Apply 1 application topically daily as  needed (rash). Cetaphil + triamcinolone cream     pantoprazole (PROTONIX) 40 MG tablet Take 1 tablet (40 mg total) by mouth 2 (two) times daily. (Patient taking differently: Take 40 mg by mouth daily.) 180 tablet 1   triamcinolone cream (KENALOG) 0.5 % Apply 1 Application topically 3 (three) times daily. 30 g 1   No current facility-administered medications for this visit.    Allergies  Allergen Reactions   Spironolactone     Cramps, dizziness   Cayenne Other (See Comments)    Sweats w/paprika too   Niacin And Related Other (See Comments)    Upset stomach   Sulfa Antibiotics Itching and Rash    "have no idea; mother told me I was allergic to"    Sulfonamide Derivatives Other (See Comments)    "have no idea; mother told me I was allergic to"    Review of Systems negative except from HPI and PMH  Physical Exam  BP 104/62   Pulse (!) 57   Ht 5\' 11"  (1.803 m)   Wt 170 lb 9.6 oz (77.4 kg)   BMI 23.79 kg/m  Well developed and well nourished in no acute distress HENT normal Neck supple with JVP-flat Clear Device pocket well healed; without hematoma or erythema.  There is no tethering  Regular rate and rhythm, no murmur gallop No  murmur Abd-soft with active BS No Clubbing cyanosis  edema Skin-warm and dry A & Oriented  Grossly normal sensory and motor function  ECG junctional rhythm at 57 with underlying atrial fibrillation  Device function is  normal.  Programming changes none  See Paceart for details     ECG  Atrial fib @ 63  -/10/43 ECG 5/22  Afib  ECG 9/21 AFib Assessment and  Plam    Atrial fibrillation s/p PVI [facility] and JA 2020 now permanent  Ischemic heart disease with prior bypass surgery  Pacemaker medtronic  NASH  HFpEF  Device function is normal.  He ventricularly paces almost never now that he is in atrial fibrillation permanently.  As noted above it was changed out because of a battery default resulting in appropriate ventricular pacing . No  symptoms of ischemia.  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 a 6%  benefit.  I suggested that the lower dose statin if it is better for his liver gives him enough of the benefit that he needs and would forego the higher dose statin.  He will review this with Dr. Pamella Pert  Volume status is stable.  Patient will follow-up with Dr. Lalla Brothers in the year

## 2023-10-01 NOTE — Patient Instructions (Signed)
Medication Instructions:  Your physician recommends that you continue on your current medications as directed. Please refer to the Current Medication list given to you today.  *If you need a refill on your cardiac medications before your next appointment, please call your pharmacy*   Lab Work: None ordered.  If you have labs (blood work) drawn today and your tests are completely normal, you will receive your results only by: [place] (if you have MyChart) OR A paper copy in the mail If you have any lab test that is abnormal or we need to change your treatment, we will call you to review the results.   Testing/Procedures: None ordered.    Follow-Up: At [facility], you and your health needs are our priority.  As part of our continuing mission to provide you with exceptional heart care, we have created designated Provider Care Teams.  These Care Teams include your primary Cardiologist (physician) and Advanced Practice Providers (APPs -  Physician Assistants and Nurse Practitioners) who all work together to provide you with the care you need, when you need it.  We recommend signing up for the patient portal called "MyChart".  Sign up information is provided on this After Visit Summary.  MyChart is used to connect with patients for Virtual Visits (Telemedicine).  Patients are able to view lab/test results, encounter notes, upcoming appointments, etc.  Non-urgent messages can be sent to your provider as well.   To learn more about what you can do with MyChart, go to [email or web address].    Your next appointment:   12 months with Dr Quentin Ore

## 2023-10-04 ENCOUNTER — Other Ambulatory Visit: Payer: Self-pay | Admitting: Internal Medicine

## 2023-10-07 ENCOUNTER — Other Ambulatory Visit: Payer: Self-pay

## 2023-10-07 DIAGNOSIS — I4821 Permanent atrial fibrillation: Secondary | ICD-10-CM

## 2023-10-07 MED ORDER — APIXABAN 5 MG PO TABS: 5.0000 mg | ORAL_TABLET | Freq: Two times a day (BID) | ORAL | 5 refills | Status: DC

## 2023-10-07 NOTE — Telephone Encounter (Signed)
Prescription refill request for Eliquis received. Indication: Afib  Last office visit: 10/01/23 Graciela Husbands)  Scr: 1.15 (08/31/23)  Age: 77 Weight: 77.4kg  Appropriate dose. Refill sent.

## 2023-10-15 ENCOUNTER — Other Ambulatory Visit (INDEPENDENT_AMBULATORY_CARE_PROVIDER_SITE_OTHER): Payer: PPO | Admitting: Pharmacist

## 2023-10-15 DIAGNOSIS — E785 Hyperlipidemia, unspecified: Secondary | ICD-10-CM

## 2023-10-15 MED ORDER — ATORVASTATIN CALCIUM 20 MG PO TABS: 20.0000 mg | ORAL_TABLET | Freq: Every day | ORAL | 1 refills | Status: DC

## 2023-10-15 NOTE — Progress Notes (Signed)
Pharmacy Quality Measure Review  This patient is appearing on a report for being at risk of failing the adherence measure for cholesterol (statin) medications this calendar year.   Medication: atorvastatin 40 mg Last fill date: 05/24/2023 for 90 day supply  Atorvastatin dose was reduced to 20 mg 07/28/2023 by GI due to cirrhosis. Patient has been taking 1/2 tablet of the 40 mg. Updated Rx has not been sent. Will update Rx for patient to get 20 mg tablets with correct quantity.    No further action needed.  Arbutus Leas, PharmD, BCPS Clinical Pharmacist [facility] [phone]

## 2023-10-18 ENCOUNTER — Encounter: Payer: Self-pay | Admitting: Gastroenterology

## 2023-10-18 DIAGNOSIS — R188 Other ascites: Secondary | ICD-10-CM

## 2023-10-19 NOTE — Telephone Encounter (Signed)
-----   Message ----- From: Benancio Deeds, MD Sent: 10/19/2023  12:33 PM EST To: Richardson Chiquito, RN Subject: RE: Diet                                       Yes we can refer him to a nutritionist. They need help with low Na diet and other options as she outlined. Thanks  ----- Message ----- From: Richardson Chiquito, RN Sent: 10/19/2023   8:28 AM EST To: Benancio Deeds, MD Subject: FW: Diet                                       Dr Adela Lank- Would you like me to make a referral to nutritionist? ----- Message -----

## 2023-10-21 NOTE — Progress Notes (Signed)
Cardiology Office Note:   Date:  10/22/2023  ID:  Russell Griffith, DOB 01-10-1946, MRN [id number] PCP: Tresa Garter, MD  [facility] Providers Cardiologist:  Rollene Rotunda, MD Electrophysiologist:  Sherryl Manges, MD {  History of Present Illness:   Russell Griffith is a 77 y.o. male who presents for evaluation of known CAD.    He has a history of coronary disease with his last catheterization demonstrated disease as below.  He had stenting of his native circ and an SVG to the circ.  He has atrial fib He has had 2 ablations 1 at [facility] and a second by Dr. Johney Frame but has persistent atrial fibrillation.  He has been previously on Tikosyn.  He did not tolerate Multaq.  He has had GI bleeding.  He decided against the Watchman.   He was having SOB and he had no high risk findings.  He subsequently had a paracentesis for treatment of ascites. secondary to non alcoholic cirrhosis.   He has changed his diet.  He is exercising routinely 3 actually thinks he physically feels better really concerned about this diagnosis.  He thinks his breathing is better.  He is not having any chest pressure, neck or arm discomfort.  He is not having any palpitations, presyncope or syncope.  He has had no weight gain or edema.  He has actually had the weight loss.    ROS: As stated in the HPI and negative for all other systems.  Studies Reviewed:    EKG:   NA  Risk Assessment/Calculations:    CHA2DS2-VASc Score = 3   This indicates a 3.2% annual risk of stroke. The patient's score is based upon: CHF History: 0 HTN History: 0 Diabetes History: 0 Stroke History: 0 Vascular Disease History: 1 Age Score: 2 Gender Score: 0    Physical Exam:   VS:  BP (!) 94/56 (BP Location: Left Arm, Patient Position: Sitting, Cuff Size: Normal)   Pulse 60   Ht 5\' 11"  (1.803 m)   Wt 167 lb (75.8 kg)   SpO2 99%   BMI 23.29 kg/m    Wt Readings from Last 3 Encounters:  10/22/23 167 lb (75.8 kg)   10/01/23 170 lb 9.6 oz (77.4 kg)  08/26/23 166 lb 6.4 oz (75.5 kg)     GEN: Well nourished, well developed in no acute distress NECK: No JVD; No carotid bruits CARDIAC: RRR, 3 out of 6 apical early peaking systolic murmur radiating slightly at aortic outflow tract, no diastolic murmurs, rubs, gallops RESPIRATORY:  Clear to auscultation without rales, wheezing or rhonchi  ABDOMEN: Soft, non-tender, non-distended EXTREMITIES:  No edema; No deformity   ASSESSMENT AND PLAN:   ATRIAL FIB:     The patient's had no symptomatic paroxysms.  He tolerates anticoagulation.  No change in therapy.   SOB: This is improved with his weight loss.  No change in therapy.  PACEMAKER PLACEMENT:    He is up-to-date with pacemaker interrogation and I reviewed the most recent EP notes.  CAD:    He had a negative perfusion study and has no ongoing symptoms.  No change in therapy  AS:  This was mild to moderate in August 2023.   I will follow-up with an echocardiogram in December.  DYSLIPIDEMIA: LDL was 52.  He is now on a lower dose of statin and I agree with this.  No change in therapy.  I will check lipids again next month.   CAROTID STENOSIS: He had  mild stenosis and we will follow-up as needed.  Follow up with me in 6 months  Signed, Rollene Rotunda, MD

## 2023-10-22 ENCOUNTER — Ambulatory Visit: Payer: PPO | Attending: Cardiology | Admitting: Cardiology

## 2023-10-22 ENCOUNTER — Encounter: Payer: Self-pay | Admitting: Cardiology

## 2023-10-22 VITALS — BP 94/56 | HR 60 | Ht 71.0 in | Wt 167.0 lb

## 2023-10-22 DIAGNOSIS — I25810 Atherosclerosis of coronary artery bypass graft(s) without angina pectoris: Secondary | ICD-10-CM

## 2023-10-22 DIAGNOSIS — R0602 Shortness of breath: Secondary | ICD-10-CM | POA: Diagnosis not present

## 2023-10-22 DIAGNOSIS — E785 Hyperlipidemia, unspecified: Secondary | ICD-10-CM | POA: Diagnosis not present

## 2023-10-22 DIAGNOSIS — I6529 Occlusion and stenosis of unspecified carotid artery: Secondary | ICD-10-CM

## 2023-10-22 DIAGNOSIS — I350 Nonrheumatic aortic (valve) stenosis: Secondary | ICD-10-CM | POA: Diagnosis not present

## 2023-10-22 NOTE — Patient Instructions (Signed)
Medication Instructions:  Your physician recommends that you continue on your current medications as directed. Please refer to the Current Medication list given to you today.  *If you need a refill on your cardiac medications before your next appointment, please call your pharmacy*  Lab Work: Lipid Panel in December 2024 (Fasting)  Testing/Procedures: Your physician has requested that you have an echocardiogram in December 2024. Echocardiography is a painless test that uses sound waves to create images of your heart. It provides your doctor with information about the size and shape of your heart and how well your heart's chambers and valves are working. This procedure takes approximately one hour. There are no restrictions for this procedure. Please do NOT wear cologne, perfume, aftershave, or lotions (deodorant is allowed). Please arrive 15 minutes prior to your appointment time. This will take place at [address] [place]   Follow-Up: At [facility], you and your health needs are our priority.  As part of our continuing mission to provide you with exceptional heart care, we have created designated Provider Care Teams.  These Care Teams include your primary Cardiologist (physician) and Advanced Practice Providers (APPs -  Physician Assistants and Nurse Practitioners) who all work together to provide you with the care you need, when you need it.   Your next appointment:   6 month(s)  Provider:   Rollene Rotunda, MD

## 2023-11-08 ENCOUNTER — Telehealth: Payer: Self-pay

## 2023-11-08 DIAGNOSIS — K760 Fatty (change of) liver, not elsewhere classified: Secondary | ICD-10-CM

## 2023-11-08 DIAGNOSIS — R188 Other ascites: Secondary | ICD-10-CM

## 2023-11-08 NOTE — Telephone Encounter (Signed)
MyChart message to patient and messages to schedulers to call patient to schedule RUQ U/s in January

## 2023-11-08 NOTE — Telephone Encounter (Signed)
-----   Message from [facility] Dewey H sent at 07/28/2023 11:03 AM EDT ----- Regarding: RUQ U/S Due for hcc screening in Jan 2025

## 2023-11-09 NOTE — Telephone Encounter (Signed)
New order placed for RUQ U/S. Called Radiology and asked that order be associated with the Jan 6th appointment at [facility] at 9 am. Arr at 8:45am. Spoke to [place]. MyChart message to patient to confirming appointment on 1-6

## 2023-11-09 NOTE — Addendum Note (Signed)
Addended by: Cooper Render on: 11/09/2023 03:43 PM   Modules accepted: Orders

## 2023-11-09 NOTE — Telephone Encounter (Signed)
Inbound call from patient stating that he spoke radiology and they advised patient the order for her liver ultrasound is closed and it needs to be reopened or orders need to be resent. Patient is tentatively scheduled for Jan 6 at 9:00 for ultrasound. Please advise.

## 2023-11-16 ENCOUNTER — Ambulatory Visit: Payer: PPO | Admitting: Gastroenterology

## 2023-11-19 ENCOUNTER — Ambulatory Visit (HOSPITAL_COMMUNITY)
Admission: RE | Admit: 2023-11-19 | Discharge: 2023-11-19 | Disposition: A | Discharge status: Home / Self Care | Payer: PPO | Source: Ambulatory Visit | Attending: Cardiology | Admitting: Cardiology

## 2023-11-19 DIAGNOSIS — I25810 Atherosclerosis of coronary artery bypass graft(s) without angina pectoris: Secondary | ICD-10-CM | POA: Diagnosis not present

## 2023-11-19 DIAGNOSIS — I350 Nonrheumatic aortic (valve) stenosis: Secondary | ICD-10-CM | POA: Diagnosis not present

## 2023-11-19 DIAGNOSIS — E785 Hyperlipidemia, unspecified: Secondary | ICD-10-CM | POA: Diagnosis not present

## 2023-11-19 LAB — ECHOCARDIOGRAM COMPLETE
AR max vel: 2.15 cm2
AV Area VTI: 2.07 cm2
AV Area mean vel: 2.14 cm2
AV Mean grad: 5.3 mm[Hg]
AV Peak grad: 10.4 mm[Hg]
Ao pk vel: 1.61 m/s
Area-P 1/2: 2.32 cm2
MV M vel: 2.34 m/s
MV Peak grad: 21.9 mm[Hg]
P 1/2 time: 575 ms
S' Lateral: 3.68 cm

## 2023-11-19 LAB — LIPID PANEL
Chol/HDL Ratio: 2.8 {ratio} (ref 0.0–5.0)
Cholesterol, Total: 100 mg/dL (ref 100–199)
HDL: 36 mg/dL — ABNORMAL LOW (ref >39)
LDL Chol Calc (NIH): 53 mg/dL (ref 0–99)
Triglycerides: 40 mg/dL (ref 0–149)
VLDL Cholesterol Cal: 11 mg/dL (ref 5–40)

## 2023-12-06 ENCOUNTER — Ambulatory Visit (HOSPITAL_COMMUNITY)
Admission: RE | Admit: 2023-12-06 | Discharge: 2023-12-06 | Disposition: A | Discharge status: Home / Self Care | Payer: PPO | Source: Ambulatory Visit | Attending: Gastroenterology | Admitting: Gastroenterology

## 2023-12-06 DIAGNOSIS — R188 Other ascites: Secondary | ICD-10-CM | POA: Insufficient documentation

## 2023-12-06 DIAGNOSIS — K7460 Unspecified cirrhosis of liver: Secondary | ICD-10-CM | POA: Diagnosis not present

## 2023-12-06 DIAGNOSIS — K760 Fatty (change of) liver, not elsewhere classified: Secondary | ICD-10-CM | POA: Diagnosis not present

## 2023-12-06 DIAGNOSIS — K828 Other specified diseases of gallbladder: Secondary | ICD-10-CM | POA: Diagnosis not present

## 2023-12-08 ENCOUNTER — Encounter: Payer: Self-pay | Admitting: Gastroenterology

## 2023-12-08 ENCOUNTER — Ambulatory Visit: Payer: PPO | Admitting: Gastroenterology

## 2023-12-08 VITALS — BP 124/68 | HR 75 | Ht 71.0 in | Wt 173.0 lb

## 2023-12-08 DIAGNOSIS — R188 Other ascites: Secondary | ICD-10-CM | POA: Diagnosis not present

## 2023-12-08 DIAGNOSIS — K760 Fatty (change of) liver, not elsewhere classified: Secondary | ICD-10-CM

## 2023-12-08 DIAGNOSIS — Z7901 Long term (current) use of anticoagulants: Secondary | ICD-10-CM

## 2023-12-08 DIAGNOSIS — K7460 Unspecified cirrhosis of liver: Secondary | ICD-10-CM | POA: Diagnosis not present

## 2023-12-08 NOTE — Patient Instructions (Addendum)
 If your blood pressure at your visit was 140/90 or greater, please contact your primary care physician to follow up on this. ______________________________________________________  If you are age 76 or older, your body mass index should be between 23-30. Your Body mass index is 24.13 kg/m. If this is out of the aforementioned range listed, please consider follow up with your Primary Care Provider.  If you are age 21 or younger, your body mass index should be between 19-25. Your Body mass index is 24.13 kg/m. If this is out of the aformentioned range listed, please consider follow up with your Primary Care Provider.  ________________________________________________________  The [facility] providers would like to encourage you to use MYCHART to communicate with providers for non-urgent requests or questions.  Due to long hold times on the telephone, sending your provider a message by [facility] may be a faster and more efficient way to get a response.  Please allow 48 business hours for a response.  Please remember that this is for non-urgent requests.  _______________________________________________________  Due to recent changes in healthcare laws, you may see the results of your imaging and laboratory studies on MyChart before your provider has had a chance to review them.  We understand that in some cases there may be results that are confusing or concerning to you. Not all laboratory results come back in the same time frame and the provider may be waiting for multiple results in order to interpret others.  Please give us  48 hours in order for your provider to thoroughly review all the results before contacting the office for clarification of your results.   You have been scheduled for an endoscopy. Please follow written instructions given to you at your visit today.  If you use inhalers (even only as needed), please bring them with you on the day of your procedure.  If you take any of the  following medications, they will need to be adjusted prior to your procedure:   DO NOT TAKE 7 DAYS PRIOR TO TEST- Trulicity (dulaglutide) Ozempic, Wegovy (semaglutide) Mounjaro (tirzepatide) Bydureon Bcise (exanatide extended release)  DO NOT TAKE 1 DAY PRIOR TO YOUR TEST Rybelsus (semaglutide) Adlyxin (lixisenatide) Victoza (liraglutide) Byetta (exanatide) ___________________________________________________________________________   Rosine will be due for labs in March and a liver ultrasound in July.  Please follow up in 6 months.  Thank you for entrusting me with your care and for choosing [facility], Dr. Elspeth Naval

## 2023-12-08 NOTE — Progress Notes (Signed)
 HPI :  78 year old male here for follow-up visit for cirrhosis of the liver.  Recall he has past medical history including CAD status post CABG x 5, persistent atrial fibrillation on Eliquis , sinus node dysfunction with pacemaker, aortic stenosis and history of GERD on chronic pantoprazole .  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     Cirrhosis history: He saw Harlene Mail in July 2024 for some abdominal pain that had been ongoing for a few months, along with feelings of bloating.  CT scan done as below   CT scan abdomen / pelvis 06/30/23: IMPRESSION: Hepatic cirrhosis and findings of portal venous hypertension. No evidence of hepatic neoplasm. New moderate ascites and diffuse mesenteric edema. Increased size of moderate right pleural effusion. Colonic diverticulosis, without radiographic evidence of diverticulitis.   This led to a paracentesis in which 400 cc were removed,, SAAG greater than 1.1, no evidence of SBP.  Cytology negative.  Total protein 4.2.  He was also referred for thoracentesis given the pleural effusion however upon attempts to drain the fluid there was no effusion noted on the ultrasound.  He subsequently underwent a serologic workup for chronic liver diseases which was negative.     SINCE LAST VISIT:  I last saw him in August.  We reviewed cirrhosis, risks for decompensation and HCC.  Main issue from onset has been fluid retention and ascites.  He has been responded quite well to diuretics and a low sodium diet.  The low-sodium diet has been helping him however has been following it very strictly and struggling with the eating out at restaurants and this is affecting him and his wife as they do enjoy food.  They inquired about seeing a nutritionist, it was not covered by their insurance, they also did not feel that it would be too useful in regards to nutritionist feedback about what they can offer them.  He is  not having any edema, no ascites.  He is lost 25 pounds since his diagnosis.  He is exercising routinely.  He has tried using some salt substitutes.  He does not drink any alcohol.  He does not have any jaundice, no history of varices, no encephalopathy, mental status sharp.  He is accompanied by his wife today  Recall had fatty liver noted on imaging dating back for a few years.  He actually had an ultrasound with elastography in 2021 with a median K PA of 6.8, low risk for fibrosis.  It was not thought he had cirrhosis.  Imaging even in 2023 did not show any evidence of cirrhosis, only fatty liver.  CT July 2024 was the first time he had imaging suggesting cirrhosis. 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  His bilirubin has been in the fours more recently.   He does continue to take Eliquis , no bleeding symptoms.  His platelet count is 155.   Of note given total protein was elevated in the ascitic fluid I had suggested an echocardiogram.  He was seen by his cardiologist and had a cardiac PET scan.  EF looks okay but he does have some ischemic changes noted in report as below.  Otherwise he recently had a right upper quadrant ultrasound yesterday, no evidence of HCC.  He is feeling well otherwise without complaints.  We did reduce his Lipitor to 20 mg daily in light of his cirrhosis dx.  Prior workup: Fatty liver noted on imaging in 2023 US  with elastography 2021 showed Median kPa: 6.8    Cardiac PET scan 07/27/23:   Findings are consistent with mild (< 10% myocardium) apical-mid lateral ischemia.  There is associated decrease in LCX stress flow (1.15 mL/min/g). The study is intermediate risk in the setting of quantiative TID and presence of RV myocardial uptake.   LV perfusion is abnormal. Defect 1: There is a medium defect with moderate reduction in uptake present in the apical to mid anterolateral and inferolateral location(s) that  is reversible. There is normal wall motion in the defect area. Consistent with ischemia.   Rest left ventricular function is normal. Rest EF: 61%. Stress left ventricular function is normal. Stress EF: 60%. End diastolic cavity size is normal. End systolic cavity size is normal.   Myocardial blood flow was computed to be 0.66ml/g/min at rest and 1.40ml/g/min at stress. Global myocardial blood flow reserve was 2.12 and was normal.   Coronary calcium  assessment not performed due to prior revascularization. Aortic valve calcification noted. Mitral annular calcification noted.  A dual lead CIED is noted.  Right atrial lead terminates into the right atrial appendage.  Right ventricular lead terminates in the right ventricular apex.  Contained within the SVC through its course.     EGD 10/2022: - Z-line irregular, did not meet criteria for Barrett's. - 1 cm hiatal hernia. - Normal esophagus otherwise - Two inflamed gastric polyps. Resected and retrieved as outlined. Clips were placed prophylactically. Rule out adenomas - Multiple benign fundic gland polyps - Normal stomach otherwise - Normal examined duodenum.   Surg path:  Hyperplastic polyps with no H. pylori   Colonoscopy July 2021 showed a single 1 mm polyp that was removed (tubular adenoma), moderate diverticulosis, and internal and external hemorrhoids.  Echo 11/19/23: EF 50-55%, mild AS   RUQ US  12/05/22: IMPRESSION: 1. Mildly heterogeneous hepatic echotexture. No sonographic evidence of mass lesion.   2. Gallbladder wall thickening. No stones. Negative sonographic Murphy's sign.      Past Medical History:  Diagnosis Date   Anemia    on Accrufer    Anxiety    Aortic stenosis    Mild, echo, April, 2014   Blood transfusion without reported diagnosis 2023   BPH (benign prostatic hyperplasia)    CAD (coronary artery disease)    a. s/p CABG   Carotid artery disease (HCC)    Cataract 2020   bilateral sx   Cirrhosis of liver (HCC)     Colonic polyp    Diverticulosis    Elevated bilirubin    Mild chronic elevation, 2.0 January, 2011 stable   GERD (gastroesophageal reflux disease)    Barrett's esophagus-on meds   Gilbert's disease    History of kidney stones    HTN (hypertension)    on meds   Hyperlipidemia    Low HDL-on meds   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HCC)    Left  lung chest x-ray July, 2013   Persistent atrial fibrillation (HCC)    a. s/p PVI at [facility]   Precancerous lesion    Forehead   Primary osteoarthritis of left knee    Mild-bilateral LE   Prolapsed internal hemorrhoids, grade 3 08/12/2015   PVC's (premature ventricular contractions)    Seasonal allergies    Tubular adenoma of colon      Past Surgical History:  Procedure Laterality Date   ABLATION     PVI at [facility]   ATRIAL FIBRILLATION ABLATION N/A 01/25/2019  Procedure: ATRIAL FIBRILLATION ABLATION;  Surgeon: Kelsie Agent, MD;  Location: [facility] INVASIVE CV LAB;  Service: Cardiovascular;  Laterality: N/A;   CARDIOVERSION N/A 10/14/2018   Procedure: CARDIOVERSION;  Surgeon: Okey Vina GAILS, MD;  Location: [facility] ENDOSCOPY;  Service: Cardiovascular;  Laterality: N/A;   CARDIOVERSION N/A 08/18/2019   Procedure: CARDIOVERSION;  Surgeon: Jeffrie Oneil BROCKS, MD;  Location: [facility] ENDOSCOPY;  Service: Cardiovascular;  Laterality: N/A;   COLONOSCOPY     CORONARY ARTERY BYPASS GRAFT  2000   CABG X5   CORONARY PRESSURE/FFR STUDY N/A 01/05/2018   Procedure: INTRAVASCULAR PRESSURE WIRE/FFR STUDY;  Surgeon: Wonda Sharper, MD;  Location: [facility] INVASIVE CV LAB;  Service: Cardiovascular;  Laterality: N/A;   CORONARY STENT INTERVENTION N/A 05/22/2022   Procedure: CORONARY STENT INTERVENTION;  Surgeon: Dann Candyce RAMAN, MD;  Location: [facility] INVASIVE CV LAB;  Service: Cardiovascular;  Laterality: N/A;   CORONARY ULTRASOUND/IVUS N/A 05/22/2022   Procedure: Intravascular Ultrasound/IVUS;  Surgeon: Dann Candyce RAMAN, MD;  Location: [facility] INVASIVE CV LAB;  Service: Cardiovascular;  Laterality: N/A;    CYSTOSCOPY WITH RETROGRADE PYELOGRAM, URETEROSCOPY AND STENT PLACEMENT Bilateral 09/04/2020   Procedure: CYSTOSCOPY WITH BILATERAL RETROGRADE PYELOGRAM, RIGHT URETEROSCOPY AND RIGHT  STENT PLACEMENT;  Surgeon: Matilda Senior, MD;  Location: [facility] ORS;  Service: Urology;  Laterality: Bilateral;   ENTEROSCOPY N/A 06/06/2022   Procedure: ENTEROSCOPY;  Surgeon: Leigh Elspeth SQUIBB, MD;  Location: [facility] ENDOSCOPY;  Service: Gastroenterology;  Laterality: N/A;   ESOPHAGOGASTRODUODENOSCOPY     HEAD & NECK SKIN LESION EXCISIONAL BIOPSY     HEMORRHOID BANDING     HEMOSTASIS CLIP PLACEMENT  06/06/2022   Procedure: HEMOSTASIS CLIP PLACEMENT;  Surgeon: Leigh Elspeth SQUIBB, MD;  Location: [facility] ENDOSCOPY;  Service: Gastroenterology;;   HOT HEMOSTASIS N/A 06/06/2022   Procedure: HOT HEMOSTASIS (ARGON PLASMA COAGULATION/BICAP);  Surgeon: Leigh Elspeth SQUIBB, MD;  Location: [facility] ENDOSCOPY;  Service: Gastroenterology;  Laterality: N/A;   IR PARACENTESIS  07/07/2023   LEFT HEART CATH AND CORS/GRAFTS ANGIOGRAPHY N/A 01/05/2018   Procedure: LEFT HEART CATH AND CORS/GRAFTS ANGIOGRAPHY;  Surgeon: Wonda Sharper, MD;  Location: [facility] INVASIVE CV LAB;  Service: Cardiovascular;  Laterality: N/A;   PERMANENT PACEMAKER INSERTION N/A 07/15/2012   Procedure: PERMANENT PACEMAKER INSERTION;  Surgeon: Elspeth BROCKS Sage, MD;  Location: [facility] CATH LAB;  Service: Cardiovascular;  Laterality: N/A;   PPM GENERATOR CHANGEOUT N/A 09/09/2022   Procedure: PPM GENERATOR CHANGEOUT;  Surgeon: Sage Elspeth BROCKS, MD;  Location: [facility] INVASIVE CV LAB;  Service: Cardiovascular;  Laterality: N/A;   RIGHT/LEFT HEART CATH AND CORONARY/GRAFT ANGIOGRAPHY N/A 05/22/2022   Procedure: RIGHT/LEFT HEART CATH AND CORONARY/GRAFT ANGIOGRAPHY;  Surgeon: Dann Candyce RAMAN, MD;  Location: [facility] INVASIVE CV LAB;  Service: Cardiovascular;  Laterality: N/A;   rotator cuff surgery Right 2017   SCLEROTHERAPY  06/06/2022   Procedure: SCLEROTHERAPY;  Surgeon: Leigh Elspeth SQUIBB, MD;   Location: [facility] ENDOSCOPY;  Service: Gastroenterology;;   TONSILLECTOMY  1956   WISDOM TOOTH EXTRACTION     Family History  Problem Relation Age of Onset   Multiple myeloma Mother    Diabetes Father    Renal Disease Father    Coronary artery disease Other 69   Diabetes Other    Hyperlipidemia Other    Hypertension Other    Colon cancer Neg Hx    Esophageal cancer Neg Hx    Stomach cancer Neg Hx    Rectal cancer Neg Hx    Colon polyps Neg Hx    Social History   Tobacco Use  Smoking status: Never   Smokeless tobacco: Never  Vaping Use   Vaping status: Never Used  Substance Use Topics   Alcohol use: Not Currently    Alcohol/week: 0.0 - 5.0 standard drinks of alcohol    Comment: 1-2 glasses wine   Drug use: No   Current Outpatient Medications  Medication Sig Dispense Refill   acetaminophen  (TYLENOL ) 500 MG tablet Take 1,000 mg by mouth every 6 (six) hours as needed for moderate pain, mild pain or headache.     apixaban  (ELIQUIS ) 5 MG TABS tablet Take 1 tablet (5 mg total) by mouth 2 (two) times daily. 60 tablet 5   atorvastatin  (LIPITOR) 20 MG tablet Take 1 tablet (20 mg total) by mouth daily. 90 tablet 1   Carboxymethylcellulose Sodium (DRY EYE RELIEF OP) Place 1 drop into both eyes daily as needed (Dry eye).     Cholecalciferol 25 MCG (1000 UT) capsule Take 1,000 Units by mouth daily with supper.      furosemide  (LASIX ) 20 MG tablet Take 1 tablet once daily as needed 90 tablet 3   isosorbide  mononitrate (IMDUR ) 60 MG 24 hr tablet Take 1 tablet (60 mg total) by mouth daily. 90 tablet 3   losartan  (COZAAR ) 25 MG tablet TAKE ONE TABLET BY MOUTH DAILY 90 tablet 3   nitroGLYCERIN  (NITROSTAT ) 0.4 MG SL tablet Place 1 tablet (0.4 mg total) under the tongue every 5 (five) minutes as needed. 25 tablet 2   NONFORMULARY OR COMPOUNDED ITEM Apply 1 application topically daily as needed (rash). Cetaphil + triamcinolone  cream     pantoprazole  (PROTONIX ) 40 MG tablet Take 1 tablet (40 mg  total) by mouth 2 (two) times daily. (Patient taking differently: Take 40 mg by mouth daily.) 180 tablet 1   triamcinolone  cream (KENALOG ) 0.5 % Apply 1 Application topically 3 (three) times daily. 30 g 1   No current facility-administered medications for this visit.   Allergies  Allergen Reactions   Spironolactone      Cramps, dizziness   Cayenne Other (See Comments)    Sweats w/paprika too   Niacin And Related Other (See Comments)    Upset stomach   Sulfa Antibiotics Itching and Rash    have no idea; mother told me I was allergic to    Sulfonamide Derivatives Other (See Comments)    have no idea; mother told me I was allergic to     Review of Systems: All systems reviewed and negative except where noted in HPI.    US  Abdomen Limited RUQ (LIVER/GB) Result Date: 12/07/2023 : PROCEDURE: ULTRASOUND ABDOMEN LIMITED HISTORY: Patient is a 78 y/o  M with cirrhosis. COMPARISON: PET-CT 07/27/2023, CT A/P 06/30/2023, US  Abd with elastography 10/29/2020. TECHNIQUE: Two-dimensional grayscale and color Doppler ultrasound of the limited abdomen was performed. FINDINGS: The liver demonstrates a mildly heterogeneous echotexture without intrahepatic biliary dilatation. No masses are visualized. There is normal hepatopetal flow visualized within a dilated main portal vein measuring 1.7 cm. The gallbladder demonstrates normal anechoic echotexture with wall thickening measuring 0.5 cm. There is no pericholecystic fluid. There are no gallstones. The common bile duct measures 0.3 cm. Negative sonographic Murphy's sign. IMPRESSION: 1. Mildly heterogeneous hepatic echotexture. No sonographic evidence of mass lesion. 2. Gallbladder wall thickening. No stones. Negative sonographic Murphy's sign. Thank you for allowing us  to assist in the care of this patient. Electronically Signed   By: Lynwood Mains M.D.   On: 12/07/2023 08:24   ECHOCARDIOGRAM COMPLETE Result Date: 11/19/2023    ECHOCARDIOGRAM  REPORT   Patient  Name:   DEMARRION MEIKLEJOHN Date of Exam: 11/19/2023 Medical Rec #:  [id number]           Height:       71.0 in Accession #:    [id number]          Weight:       167.0 lb Date of Birth:  1946/05/15           BSA:          1.953 m Patient Age:    77 years            BP:           95/42 mmHg Patient Gender: M                   HR:           47 bpm. Exam Location:  Outpatient Procedure: 2D Echo, 3D Echo, Cardiac Doppler, Color Doppler and Strain Analysis Indications:    Nonrheumatic aortic valve stenosis [I35.0 (ICD-10-CM)]  History:        Patient has prior history of Echocardiogram examinations, most                 recent 07/13/2022. CAD, Arrythmias:PVC and Atrial Fibrillation;                 Risk Factors:Hypertension and Dyslipidemia.  Sonographer:    Rosaline Fujisawa MHA, RDMS, RVT, RDCS Referring Phys: [id number] [facility]  Sonographer Comments: Global longitudinal strain was attempted. IMPRESSIONS  1. Abnormal septal motion. Left ventricular ejection fraction, by estimation, is 50 to 55%. The left ventricle has low normal function. The left ventricle has no regional wall motion abnormalities. Left ventricular diastolic parameters are indeterminate.  2. Catheter in RA/RV . Right ventricular systolic function is normal. The right ventricular size is normal.  3. Left atrial size was mildly dilated.  4. Right atrial size was mildly dilated.  5. The mitral valve is abnormal. Trivial mitral valve regurgitation. No evidence of mitral stenosis.  6. Gradients lower than prior TTE and AVA now > 2.0 cm2 despite significant calcification. The aortic valve is tricuspid. There is severe calcifcation of the aortic valve. There is severe thickening of the aortic valve. Aortic valve regurgitation is mild. Mild aortic valve stenosis.  7. The inferior vena cava is dilated in size with >50% respiratory variability, suggesting right atrial pressure of 8 mmHg. FINDINGS  Left Ventricle: Abnormal septal motion. Left ventricular  ejection fraction, by estimation, is 50 to 55%. The left ventricle has low normal function. The left ventricle has no regional wall motion abnormalities. The left ventricular internal cavity size was normal in size. There is no left ventricular hypertrophy. Left ventricular diastolic parameters are indeterminate. Right Ventricle: Catheter in RA/RV. The right ventricular size is normal. No increase in right ventricular wall thickness. Right ventricular systolic function is normal. Left Atrium: Left atrial size was mildly dilated. Right Atrium: Right atrial size was mildly dilated. Pericardium: There is no evidence of pericardial effusion. Mitral Valve: The mitral valve is abnormal. There is mild thickening of the mitral valve leaflet(s). Trivial mitral valve regurgitation. No evidence of mitral valve stenosis. Tricuspid Valve: The tricuspid valve is normal in structure. Tricuspid valve regurgitation is mild . No evidence of tricuspid stenosis. Aortic Valve: Gradients lower than prior TTE and AVA now > 2.0 cm2 despite significant calcification. The aortic valve is tricuspid. There is severe calcifcation of the aortic valve. There  is severe thickening of the aortic valve. Aortic valve regurgitation is mild. Aortic regurgitation PHT measures 575 msec. Mild aortic stenosis is present. Aortic valve mean gradient measures 5.3 mmHg. Aortic valve peak gradient measures 10.4 mmHg. Aortic valve area, by VTI measures 2.07 cm. Pulmonic Valve: The pulmonic valve was normal in structure. Pulmonic valve regurgitation is not visualized. No evidence of pulmonic stenosis. Aorta: The aortic root is normal in size and structure. Venous: The inferior vena cava is dilated in size with greater than 50% respiratory variability, suggesting right atrial pressure of 8 mmHg. IAS/Shunts: No atrial level shunt detected by color flow Doppler.  LEFT VENTRICLE PLAX 2D LVIDd:         5.12 cm   Diastology LVIDs:         3.68 cm   LV e' medial:     6.53 cm/s LV PW:         0.88 cm   LV E/e' medial:  16.4 LV IVS:        0.80 cm   LV e' lateral:   11.00 cm/s LVOT diam:     2.20 cm   LV E/e' lateral: 9.7 LV SV:         75 LV SV Index:   39        2D Longitudinal Strain LVOT Area:     3.80 cm  2D Strain GLS Avg:     -14.2 %                           3D Volume EF:                          3D EF:        56 %                          LV EDV:       165 ml                          LV ESV:       72 ml                          LV SV:        93 ml RIGHT VENTRICLE RV Basal diam:  4.41 cm RV Mid diam:    3.27 cm RV S prime:     7.65 cm/s TAPSE (M-mode): 1.3 cm LEFT ATRIUM           Index        RIGHT ATRIUM           Index LA diam:      4.11 cm 2.10 cm/m   RA Area:     17.60 cm LA Vol (A2C): 87.4 ml 44.75 ml/m  RA Volume:   44.10 ml  22.58 ml/m LA Vol (A4C): 31.7 ml 16.23 ml/m  AORTIC VALVE AV Area (Vmax):    2.15 cm AV Area (Vmean):   2.14 cm AV Area (VTI):     2.07 cm AV Vmax:           161.33 cm/s AV Vmean:          101.333 cm/s AV VTI:            0.363 m AV Peak Grad:  10.4 mmHg AV Mean Grad:      5.3 mmHg LVOT Vmax:         91.40 cm/s LVOT Vmean:        57.000 cm/s LVOT VTI:          0.198 m LVOT/AV VTI ratio: 0.54 AI PHT:            575 msec  AORTA Ao Root diam: 2.97 cm Ao Asc diam:  3.68 cm MITRAL VALVE                TRICUSPID VALVE MV Area (PHT): 2.32 cm     TR Peak grad:   27.2 mmHg MV Decel Time: 327 msec     TR Vmax:        261.00 cm/s MR Peak grad: 21.9 mmHg MR Vmax:      234.00 cm/s   SHUNTS MV E velocity: 107.00 cm/s  Systemic VTI:  0.20 m                             Systemic Diam: 2.20 cm Maude Emmer MD Electronically signed by Maude Emmer MD Signature Date/Time: 11/19/2023/12:19:24 PM    Final     Lab Results  Component Value Date   WBC 5.0 07/09/2023   HGB 13.7 07/09/2023   HCT 42.2 07/09/2023   MCV 85.5 07/09/2023   PLT 155.0 07/09/2023    Lab Results  Component Value Date   NA 141 08/31/2023   CL 105 08/31/2023   K 3.9  08/31/2023   CO2 27 08/31/2023   BUN 22 08/31/2023   CREATININE 1.15 08/31/2023   GFR 62.20 08/31/2023   CALCIUM  9.5 08/31/2023   ALBUMIN 4.2 08/06/2023   GLUCOSE 90 08/31/2023    Lab Results  Component Value Date   ALT 39 08/06/2023   AST 31 08/06/2023   ALKPHOS 111 08/06/2023   BILITOT 3.7 (H) 08/06/2023     Physical Exam: BP 124/68   Pulse 75   Ht 5' 11 (1.803 m)   Wt 173 lb (78.5 kg)   SpO2 98%   BMI 24.13 kg/m  Constitutional: Pleasant,well-developed, male in no acute distress. Neurological: Alert and oriented to person place and time. Psychiatric: Normal mood and affect. Behavior is normal.   ASSESSMENT: 78 y.o. male here for assessment of the following  1. Cirrhosis of liver with ascites, unspecified hepatic cirrhosis type (HCC)   2. Fatty liver   3. 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  Fortunately, he has responded extremely well to diuretic dosing and low-sodium diet.  He is compensated at this time, no edema, no ascites.  His bilirubin is chronically elevated to some extent from Gilbert's so his MELD may be artificially high.  We discussed his cirrhosis, hopefully this is something he can live with and never cause a similar problem.  The main issue that has been a struggle for him is a strict low-sodium diet.  We had a lengthy discussion about this.  He is doing his best at home but if he wants to go out to dinner with his wife from time to time he needs to be able to live his life and have a meal that he can enjoy.  He can increase dosing of Lasix  as needed if he needs to.  He is  incredibly active, echocardiogram recently looked okay.  He will see how things go with liberalizing his diet little bit to enjoy food and be able to eat out at restaurants from time to time.  Otherwise we discussed role of an EGD to rescreen his  esophagus in light of his newly diagnosed cirrhosis.  I discussed risk benefits of the exam and anesthesia and he is agreeable, we will plan on EGD this month.  Will keep an eye on his labs every 6 months, due for those in March and a recall ultrasound in July for [facility] screening.  He understands risks for  [facility] with cirrhosis and is agreeable to surveillance ultrasound twice yearly.   PLAN: - discussed low Na diet at length, they will liberalize a little bit - continue lasix , can take an extra dose PRN - EGD at the [facility] - 1/15 at 830 AM - labs in March - CBC, LFTs, AFP - RUQ US  recall in July - f/u 6 months if not sooner  Marcey Naval, MD [facility] Gastroenterology

## 2023-12-09 ENCOUNTER — Ambulatory Visit (INDEPENDENT_AMBULATORY_CARE_PROVIDER_SITE_OTHER): Payer: PPO

## 2023-12-09 DIAGNOSIS — I495 Sick sinus syndrome: Secondary | ICD-10-CM | POA: Diagnosis not present

## 2023-12-09 LAB — CUP PACEART REMOTE DEVICE CHECK
Battery Remaining Longevity: 170 mo
Battery Voltage: 3.13 V
Brady Statistic AP VP Percent: 0 %
Brady Statistic AP VS Percent: 0 %
Brady Statistic AS VP Percent: 2.56 %
Brady Statistic AS VS Percent: 97.44 %
Brady Statistic RA Percent Paced: 0 %
Brady Statistic RV Percent Paced: 2.56 %
Date Time Interrogation Session: 20250109045731
Implantable Lead Connection Status: 753985
Implantable Lead Connection Status: 753985
Implantable Lead Implant Date: 20130816
Implantable Lead Implant Date: 20130816
Implantable Lead Location: 753859
Implantable Lead Location: 753860
Implantable Lead Model: 5076
Implantable Lead Model: 5076
Implantable Pulse Generator Implant Date: 20231011
Lead Channel Impedance Value: 323 Ohm
Lead Channel Impedance Value: 475 Ohm
Lead Channel Impedance Value: 665 Ohm
Lead Channel Impedance Value: 893 Ohm
Lead Channel Pacing Threshold Amplitude: 2.5 V
Lead Channel Pacing Threshold Pulse Width: 0.4 ms
Lead Channel Sensing Intrinsic Amplitude: 0.375 mV
Lead Channel Sensing Intrinsic Amplitude: 15.125 mV
Lead Channel Sensing Intrinsic Amplitude: 15.125 mV
Lead Channel Setting Pacing Amplitude: 2.5 V
Lead Channel Setting Pacing Pulse Width: 1 ms
Lead Channel Setting Sensing Sensitivity: 0.9 mV
Zone Setting Status: 755011

## 2023-12-12 ENCOUNTER — Encounter: Payer: Self-pay | Admitting: Certified Registered Nurse Anesthetist

## 2023-12-15 ENCOUNTER — Encounter: Payer: PPO | Admitting: Gastroenterology

## 2023-12-15 ENCOUNTER — Ambulatory Visit: Payer: PPO | Admitting: Gastroenterology

## 2023-12-15 ENCOUNTER — Encounter: Payer: Self-pay | Admitting: Gastroenterology

## 2023-12-15 VITALS — BP 111/57 | HR 14 | Temp 97.5°F | Resp 16 | Ht 71.0 in | Wt 173.0 lb

## 2023-12-15 DIAGNOSIS — I2510 Atherosclerotic heart disease of native coronary artery without angina pectoris: Secondary | ICD-10-CM | POA: Diagnosis not present

## 2023-12-15 DIAGNOSIS — K7460 Unspecified cirrhosis of liver: Secondary | ICD-10-CM

## 2023-12-15 DIAGNOSIS — K449 Diaphragmatic hernia without obstruction or gangrene: Secondary | ICD-10-CM

## 2023-12-15 DIAGNOSIS — R188 Other ascites: Secondary | ICD-10-CM | POA: Diagnosis not present

## 2023-12-15 DIAGNOSIS — I10 Essential (primary) hypertension: Secondary | ICD-10-CM | POA: Diagnosis not present

## 2023-12-15 DIAGNOSIS — K317 Polyp of stomach and duodenum: Secondary | ICD-10-CM | POA: Diagnosis not present

## 2023-12-15 MED ORDER — SODIUM CHLORIDE 0.9 % IV SOLN: 500.0000 mL | Freq: Once | INTRAVENOUS | Status: DC

## 2023-12-15 NOTE — Op Note (Signed)
 [facility] Patient Name: Russell Griffith Procedure Date: 12/15/2023 8:16 AM MRN: [id number] Endoscopist: Landon Pinion P. General Kenner , MD, [id number] Age: 78 Referring MD:  Date of Birth: [birth year] Gender: Male Account #: [id number] Procedure:                Upper GI endoscopy Indications:              Cirrhosis rule out esophageal varices Medicines:                Monitored Anesthesia Care Procedure:                Pre-Anesthesia Assessment:                           - Prior to the procedure, a History and Physical                            was performed, and patient medications and                            allergies were reviewed. The patient's tolerance of                            previous anesthesia was also reviewed. The risks                            and benefits of the procedure and the sedation                            options and risks were discussed with the patient.                            All questions were answered, and informed consent                            was obtained. Prior Anticoagulants: The patient has                            taken Eliquis  (apixaban ), last dose was day of                            procedure. ASA Grade Assessment: III - A patient                            with severe systemic disease. After reviewing the                            risks and benefits, the patient was deemed in                            satisfactory condition to undergo the procedure.                           After obtaining informed consent, the endoscope was  passed under direct vision. Throughout the                            procedure, the patient's blood pressure, pulse, and                            oxygen  saturations were monitored continuously. The                            GIF [id number] #[id number] was introduced through the                            mouth, and advanced to the second part of duodenum.                             The upper GI endoscopy was accomplished without                            difficulty. The patient tolerated the procedure                            well. Scope In: Scope Out: Findings:                 Esophagogastric landmarks were identified: the                            Z-line was found at 44 cm, the gastroesophageal                            junction was found at 44 cm and the upper extent of                            the gastric folds was found at 45 cm from the                            incisors.                           A 1 cm hiatal hernia was present.                           The Z-line was irregular but did not meet criteria                            for Barrett's (< 1cm in length).                           Benign gastric inlet patches were noted in the                            proximal esophagus inferior to the UES. The exam of  the esophagus was otherwise normal. No varices.                           Multiple small sessile polyps were found in the                            gastric body. He previously has had benign                            hyperplastic polyps removed, no biopsies taken.                           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                           The exam of the stomach was otherwise normal. No                            varices.                           The examined duodenum was normal. Complications:            No immediate complications. Estimated blood loss:                            None. Estimated Blood Loss:     Estimated blood loss: none. Impression:               - Esophagogastric landmarks identified.                           - 1 cm hiatal hernia.                           - Z-line irregular but did not meet criteria for                            Barrett's.                           - Benign gastric inlet  patch of the proximal                            esophagus                           - Normal esophagus otherwise - no varices.                           - Multiple gastric polyps consistent with known                            prior hyperplastic polyps. Small.                           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                           - Normal stomach otherwise - no varices.                           - Normal examined duodenum. Recommendation:           - Patient has a contact number available for                            emergencies. The signs and symptoms of potential                            delayed complications were discussed with the                            patient. Return to normal activities tomorrow.                            Written discharge instructions were provided to the                            patient.                           - Resume previous diet.                           - Continue present medications.                           - Repeat EGD in 2-3 years or sooner pending                            clinical course, to screen for varices Landon Pinion P. Aubryanna Nesheim, MD 12/15/2023 8:43:48 AM This report has been signed electronically. Muhammad Ahmed, MD

## 2023-12-15 NOTE — Progress Notes (Signed)
 History and Physical Interval Note: Seen in the office on 12/08/23 - history of cirrhosis, screening for varices. On Eliquis . No interval changes. He feels well without complaints.    12/15/2023 8:24 AM  Russell Griffith  has presented today for endoscopic procedure(s), with the diagnosis of  Encounter Diagnosis  Name Primary?   Cirrhosis of liver with ascites, unspecified hepatic cirrhosis type (HCC) Yes  .  The various methods of evaluation and treatment have been discussed with the patient and/or family. After consideration of risks, benefits and other options for treatment, the patient has consented to  the endoscopic procedure(s).   The patient's history has been reviewed, patient examined, no change in status, stable for surgery.  I have reviewed the patient's chart and labs.  Questions were answered to the patient's satisfaction.    Christi Coward, MD [facility]

## 2023-12-15 NOTE — Progress Notes (Signed)
 Report given to PACU, vss

## 2023-12-15 NOTE — Progress Notes (Signed)
0826 Robinul 0.1 mg IV given due large amount of secretions upon assessment.  MD made aware, vss

## 2023-12-15 NOTE — Patient Instructions (Addendum)
 -  Resume previous diet. - Continue present medications.  YOU HAD AN ENDOSCOPIC PROCEDURE TODAY AT THE [facility]:   Refer to the procedure report that was given to you for any specific questions about what was found during the examination.  If the procedure report does not answer your questions, please call your gastroenterologist to clarify.  If you requested that your care partner not be given the details of your procedure findings, then the procedure report has been included in a sealed envelope for you to review at your convenience later.  YOU SHOULD EXPECT: Some feelings of bloating in the abdomen. Passage of more gas than usual.  Walking can help get rid of the air that was put into your GI tract during the procedure and reduce the bloating. If you had a lower endoscopy (such as a colonoscopy or flexible sigmoidoscopy) you may notice spotting of blood in your stool or on the toilet paper. If you underwent a bowel prep for your procedure, you may not have a normal bowel movement for a few days.  Please Note:  You might notice some irritation and congestion in your nose or some drainage.  This is from the oxygen used during your procedure.  There is no need for concern and it should clear up in a day or so.  SYMPTOMS TO REPORT IMMEDIATELY:    Following upper endoscopy (EGD)  Vomiting of blood or coffee ground material  New chest pain or pain under the shoulder blades  Painful or persistently difficult swallowing  New shortness of breath  Fever of 100F or higher  Black, tarry-looking stools  For urgent or emergent issues, a gastroenterologist can be reached at any hour by calling [phone]. Do not use MyChart messaging for urgent concerns.    DIET:  We do recommend a small meal at first, but then you may proceed to your regular diet.  Drink plenty of fluids but you should avoid alcoholic beverages for 24 hours.  ACTIVITY:  You should plan to take it easy for the rest  of today and you should NOT DRIVE or use heavy machinery until tomorrow (because of the sedation medicines used during the test).    FOLLOW UP: Our staff will call the number listed on your records the next business day following your procedure.  We will call around 7:15- 8:00 am to check on you and address any questions or concerns that you may have regarding the information given to you following your procedure. If we do not reach you, we will leave a message.     If any biopsies were taken you will be contacted by phone or by letter within the next 1-3 weeks.  Please call us at [phone] if you have not heard about the biopsies in 3 weeks.    SIGNATURES/CONFIDENTIALITY: You and/or your care partner have signed paperwork which will be entered into your electronic medical record.  These signatures attest to the fact that that the information above on your After Visit Summary has been reviewed and is understood.  Full responsibility of the confidentiality of this discharge information lies with you and/or your care-partner.

## 2023-12-15 NOTE — Progress Notes (Signed)
 Pt's states no medical or surgical changes since previsit or office visit.

## 2023-12-16 ENCOUNTER — Telehealth: Payer: Self-pay | Admitting: *Deleted

## 2023-12-16 NOTE — Telephone Encounter (Signed)
  Follow up Call-     12/15/2023    7:54 AM 11/02/2022    2:18 PM  Call back number  Post procedure Call Back phone  # [phone]  Permission to leave phone message Yes Yes     Patient questions:  Do you have a fever, pain , or abdominal swelling? No. Pain Score  0 *  Have you tolerated food without any problems? Yes.    Have you been able to return to your normal activities? Yes.    Do you have any questions about your discharge instructions: Diet   No. Medications  No. Follow up visit  No.  Do you have questions or concerns about your Care? No.  Actions: * If pain score is 4 or above: No action needed, pain <4.

## 2023-12-27 ENCOUNTER — Ambulatory Visit: Payer: Self-pay | Admitting: Internal Medicine

## 2023-12-27 NOTE — Telephone Encounter (Signed)
   Chief Complaint: sinus congestion Symptoms: nasal congestion, cough, sinus pain around eyes/nose Frequency: comes and goes  Disposition: [] ED /[] Urgent Care (no appt availability in office) / [x] Appointment(In office/virtual)/ []  [facility]/ [] Home Care/ [] Refused Recommended Disposition /[] [facility]/ []  Follow-up with PCP Additional Notes: Pt complaining of sinus congestion that started last Tuesday. Pt has eye/nose pain, yellow-greenish mucus, and cough. Pt denies fever and SOB. Pt would only see PCP [name] and nearest appt is 1/30. If you have a cancellation before then, please call pt. RN gave care advice and pt verbalized understanding.           Copied from CRM [id number]. Topic: Clinical - Red Word Triage >> Dec 27, 2023 10:43 AM Marica Otter wrote: [facility] that prompted transfer to Nurse Triage: Pain around eyes, green and yellow mucus, cough, not sleeping when he lays down congestion in chest. Reason for Disposition  [1] Sinus congestion (pressure, fullness) AND [2] present > 10 days  Answer Assessment - Initial Assessment Questions 1. LOCATION: "Where does it hurt?"      Tender around eyes/nose 2. ONSET: "When did the sinus pain start?"  (e.g., hours, days)      1/21 3. SEVERITY: "How bad is the pain?"   (Scale 1-10; mild, moderate or severe)   - MILD (1-3): doesn't interfere with normal activities    - MODERATE (4-7): interferes with normal activities (e.g., work or school) or awakens from sleep   - SEVERE (8-10): excruciating pain and patient unable to do any normal activities        4-5 4. RECURRENT SYMPTOM: "Have you ever had sinus problems before?" If Yes, ask: "When was the last time?" and "What happened that time?"      Yes- unsure 5. NASAL CONGESTION: "Is the nose blocked?" If Yes, ask: "Can you open it or must you breathe through your mouth?"     Yes, saline solution to open  6. NASAL DISCHARGE: "Do you have discharge from your  nose?" If so ask, "What color?"     Yellowing/green 7. FEVER: "Do you have a fever?" If Yes, ask: "What is it, how was it measured, and when did it start?"      no 8. OTHER SYMPTOMS: "Do you have any other symptoms?" (e.g., sore throat, cough, earache, difficulty breathing)     cough  Protocols used: Sinus Pain or Congestion-A-AH

## 2023-12-27 NOTE — Telephone Encounter (Signed)
OV w/any provider if needs to be seen sooner Thx

## 2023-12-29 ENCOUNTER — Encounter: Payer: Self-pay | Admitting: Gastroenterology

## 2023-12-30 ENCOUNTER — Encounter: Payer: Self-pay | Admitting: Internal Medicine

## 2023-12-30 ENCOUNTER — Telehealth: Payer: Self-pay

## 2023-12-30 ENCOUNTER — Ambulatory Visit (INDEPENDENT_AMBULATORY_CARE_PROVIDER_SITE_OTHER): Payer: PPO | Admitting: Internal Medicine

## 2023-12-30 VITALS — BP 90/58 | HR 81 | Temp 98.6°F | Ht 71.0 in | Wt 174.2 lb

## 2023-12-30 DIAGNOSIS — J019 Acute sinusitis, unspecified: Secondary | ICD-10-CM | POA: Insufficient documentation

## 2023-12-30 DIAGNOSIS — Z7901 Long term (current) use of anticoagulants: Secondary | ICD-10-CM

## 2023-12-30 DIAGNOSIS — R059 Cough, unspecified: Secondary | ICD-10-CM | POA: Insufficient documentation

## 2023-12-30 DIAGNOSIS — J0100 Acute maxillary sinusitis, unspecified: Secondary | ICD-10-CM

## 2023-12-30 DIAGNOSIS — R051 Acute cough: Secondary | ICD-10-CM | POA: Diagnosis not present

## 2023-12-30 MED ORDER — CEFDINIR 300 MG PO CAPS: 300.0000 mg | ORAL_CAPSULE | Freq: Two times a day (BID) | ORAL | 0 refills | Status: DC

## 2023-12-30 MED ORDER — HYDROCODONE BIT-HOMATROP MBR 5-1.5 MG/5ML PO SOLN: 5.0000 mL | Freq: Three times a day (TID) | ORAL | 0 refills | Status: DC | PRN

## 2023-12-30 NOTE — Patient Instructions (Signed)
Use Afrin

## 2023-12-30 NOTE — Assessment & Plan Note (Signed)
URI - Hycodan prn

## 2023-12-30 NOTE — Assessment & Plan Note (Signed)
Afrin x 5 d to stop bleeding

## 2023-12-30 NOTE — Assessment & Plan Note (Signed)
New. Omnicef every day x 10 d Use Afrin prn

## 2023-12-30 NOTE — Telephone Encounter (Signed)
Copied from CRM [id number]. Topic: Clinical - Prescription Issue >> Dec 30, 2023  1:07 PM Truddie Crumble wrote: Reason for CRM: patient called stating the pharmacy did not have the cough medicine because it was on back order and they sent an alternative to the doctor for approval and they have not heard back yet. Patient stated he does have the antibiotic  CB# for [phone]

## 2023-12-30 NOTE — Progress Notes (Signed)
Subjective:  Patient ID: Russell Griffith, male    DOB: 20-Sep-1946  Age: 78 y.o. MRN: [id number]  CC: Sinus Problem (Sinus congestion and nasal drainage for the last week. Notes blood in sputum (patient states they are on blood thinner and believes this is the cause). Patient also has more drainage when laying down which has been prompting cough and causing issues with sleep. No issues with breathing or chest congestion. Currently treating with saline nasal spray)   HPI Russell Griffith presents for sinus Problem (Sinus congestion and nasal drainage for the last week. Notes blood in sputum (patient states they are on blood thinner and believes this is the cause). Patient also has more drainage when laying down which has been prompting cough and causing issues with sleep. No issues with breathing or chest congestion. Currently treating with saline nasal spray)  Outpatient Medications Prior to Visit  Medication Sig Dispense Refill   acetaminophen (TYLENOL) 500 MG tablet Take 1,000 mg by mouth every 6 (six) hours as needed for moderate pain, mild pain or headache.     apixaban (ELIQUIS) 5 MG TABS tablet Take 1 tablet (5 mg total) by mouth 2 (two) times daily. 60 tablet 5   atorvastatin (LIPITOR) 20 MG tablet Take 1 tablet (20 mg total) by mouth daily. 90 tablet 1   Carboxymethylcellulose Sodium (DRY EYE RELIEF OP) Place 1 drop into both eyes daily as needed (Dry eye).     Cholecalciferol 25 MCG (1000 UT) capsule Take 1,000 Units by mouth daily with supper.      furosemide (LASIX) 20 MG tablet Take 1 tablet once daily as needed 90 tablet 3   isosorbide mononitrate (IMDUR) 60 MG 24 hr tablet Take 1 tablet (60 mg total) by mouth daily. 90 tablet 3   losartan (COZAAR) 25 MG tablet TAKE ONE TABLET BY MOUTH DAILY 90 tablet 3   nitroGLYCERIN (NITROSTAT) 0.4 MG SL tablet Place 1 tablet (0.4 mg total) under the tongue every 5 (five) minutes as needed. 25 tablet 2   NONFORMULARY OR COMPOUNDED ITEM  Apply 1 application topically daily as needed (rash). Cetaphil + triamcinolone cream     pantoprazole (PROTONIX) 40 MG tablet Take 1 tablet (40 mg total) by mouth 2 (two) times daily. (Patient taking differently: Take 40 mg by mouth daily.) 180 tablet 1   triamcinolone cream (KENALOG) 0.5 % Apply 1 Application topically 3 (three) times daily. 30 g 1   No facility-administered medications prior to visit.    ROS: Review of Systems  Constitutional:  Negative for appetite change, fatigue and unexpected weight change.  HENT:  Positive for congestion, postnasal drip, rhinorrhea and sinus pain. Negative for nosebleeds, sneezing, sore throat and trouble swallowing.   Eyes:  Negative for itching and visual disturbance.  Respiratory:  Negative for cough.   Cardiovascular:  Negative for chest pain, palpitations and leg swelling.  Gastrointestinal:  Negative for abdominal distention, blood in stool, diarrhea and nausea.  Genitourinary:  Negative for frequency and hematuria.  Musculoskeletal:  Negative for back pain, gait problem, joint swelling and neck pain.  Skin:  Negative for rash.  Neurological:  Negative for dizziness, tremors, speech difficulty and weakness.  Psychiatric/Behavioral:  Negative for agitation, dysphoric mood and sleep disturbance. The patient is not nervous/anxious.     Objective:  BP (!) 90/58   Pulse 81   Temp 98.6 F (37 C)   Ht 5\' 11"  (1.803 m)   Wt 174 lb 3.2 oz (79 kg)  SpO2 98%   BMI 24.30 kg/m   BP Readings from Last 3 Encounters:  12/30/23 (!) 90/58  12/15/23 (!) 111/57  12/08/23 124/68    Wt Readings from Last 3 Encounters:  12/30/23 174 lb 3.2 oz (79 kg)  12/15/23 173 lb (78.5 kg)  12/08/23 173 lb (78.5 kg)    Physical Exam Constitutional:      General: He is not in acute distress.    Appearance: He is well-developed.     Comments: NAD  HENT:     Nose: Congestion present.     Mouth/Throat:     Pharynx: Posterior oropharyngeal erythema present.   Eyes:     Conjunctiva/sclera: Conjunctivae normal.     Pupils: Pupils are equal, round, and reactive to light.  Neck:     Thyroid: No thyromegaly.     Vascular: No JVD.  Cardiovascular:     Rate and Rhythm: Normal rate and regular rhythm.     Heart sounds: Normal heart sounds. No murmur heard.    No friction rub. No gallop.  Pulmonary:     Effort: Pulmonary effort is normal. No respiratory distress.     Breath sounds: Normal breath sounds. No wheezing or rales.  Chest:     Chest wall: No tenderness.  Abdominal:     General: Bowel sounds are normal. There is no distension.     Palpations: Abdomen is soft. There is no mass.     Tenderness: There is no abdominal tenderness. There is no guarding or rebound.  Musculoskeletal:        General: No tenderness. Normal range of motion.     Cervical back: Normal range of motion.  Lymphadenopathy:     Cervical: No cervical adenopathy.  Skin:    General: Skin is warm and dry.     Findings: No rash.  Neurological:     Mental Status: He is alert and oriented to person, place, and time.     Cranial Nerves: No cranial nerve deficit.     Motor: No abnormal muscle tone.     Coordination: Coordination normal.     Gait: Gait normal.     Deep Tendon Reflexes: Reflexes are normal and symmetric.  Psychiatric:        Behavior: Behavior normal.        Thought Content: Thought content normal.        Judgment: Judgment normal.     Lab Results  Component Value Date   WBC 5.0 07/09/2023   HGB 13.7 07/09/2023   HCT 42.2 07/09/2023   PLT 155.0 07/09/2023   GLUCOSE 90 08/31/2023   CHOL 100 11/19/2023   TRIG 40 11/19/2023   HDL 36 (L) 11/19/2023   LDLCALC 53 11/19/2023   ALT 39 08/06/2023   AST 31 08/06/2023   NA 141 08/31/2023   K 3.9 08/31/2023   CL 105 08/31/2023   CREATININE 1.15 08/31/2023   BUN 22 08/31/2023   CO2 27 08/31/2023   TSH 6.04 (H) 04/14/2023   PSA 0.81 09/29/2021   INR 1.5 (H) 06/06/2022   HGBA1C 5.7 03/11/2023     US Abdomen Limited RUQ (LIVER/GB) Result Date: 12/07/2023 : PROCEDURE: ULTRASOUND ABDOMEN LIMITED HISTORY: Patient is a 78 y/o  M with cirrhosis. COMPARISON: PET-CT 07/27/2023, CT A/P 06/30/2023, [place] Abd with elastography 10/29/2020. TECHNIQUE: Two-dimensional grayscale and color Doppler ultrasound of the limited abdomen was performed. FINDINGS: The liver demonstrates a mildly heterogeneous echotexture without intrahepatic biliary dilatation. No masses are visualized. There is  normal hepatopetal flow visualized within a dilated main portal vein measuring 1.7 cm. The gallbladder demonstrates normal anechoic echotexture with wall thickening measuring 0.5 cm. There is no pericholecystic fluid. There are no gallstones. The common bile duct measures 0.3 cm. Negative sonographic Murphy's sign. IMPRESSION: 1. Mildly heterogeneous hepatic echotexture. No sonographic evidence of mass lesion. 2. Gallbladder wall thickening. No stones. Negative sonographic Murphy's sign. Thank you for allowing [place] to assist in the care of this patient. Electronically Signed   By: Lestine Box M.D.   On: 12/07/2023 08:24    Assessment & Plan:   Problem List Items Addressed This Visit     Anticoagulated   Afrin x 5 d to stop bleeding      Acute sinusitis - Primary   New. Omnicef every day x 10 d Use Afrin prn      Relevant Medications   cefdinir (OMNICEF) 300 MG capsule   HYDROcodone bit-homatropine (HYCODAN) 5-1.5 MG/5ML syrup   Cough   URI - Hycodan prn         Meds ordered this encounter  Medications   cefdinir (OMNICEF) 300 MG capsule    Sig: Take 1 capsule (300 mg total) by mouth 2 (two) times daily.    Dispense:  20 capsule    Refill:  0   HYDROcodone bit-homatropine (HYCODAN) 5-1.5 MG/5ML syrup    Sig: Take 5 mLs by mouth every 8 (eight) hours as needed for cough.    Dispense:  240 mL    Refill:  0      Follow-up: Return for a follow-up visit.  Sonda Primes, MD

## 2023-12-31 MED ORDER — HYDROCOD POLI-CHLORPHE POLI ER 10-8 MG/5ML PO SUER: 5.0000 mL | Freq: Two times a day (BID) | ORAL | 0 refills | Status: DC | PRN

## 2023-12-31 NOTE — Addendum Note (Signed)
Addended by: Tresa Garter on: 12/31/2023 03:17 PM   Modules accepted: Orders

## 2023-12-31 NOTE — Telephone Encounter (Signed)
Okay Tussionex.  Thank you

## 2024-01-04 ENCOUNTER — Telehealth: Payer: Self-pay

## 2024-01-04 ENCOUNTER — Other Ambulatory Visit (HOSPITAL_COMMUNITY): Payer: Self-pay

## 2024-01-04 NOTE — Telephone Encounter (Signed)
I received a request to do a prior auth on Chlorpheniramine-hydrocodone 10/8mg /70ml, but patient has medicare part d and they do not cover this drug. Dr Janae Bridgeman want to call in something else or the patient can pay out of pocket.

## 2024-01-05 MED ORDER — GUAIFENESIN-CODEINE 100-10 MG/5ML PO SOLN: 5.0000 mL | Freq: Four times a day (QID) | ORAL | 0 refills | Status: DC | PRN

## 2024-01-05 NOTE — Telephone Encounter (Signed)
 I sent a prescription for cough syrup with codeine  too [facility].  Thank you

## 2024-01-05 NOTE — Addendum Note (Signed)
 Addended by: Wyoma Genson V on: 01/05/2024 12:38 AM   Modules accepted: Orders

## 2024-01-18 NOTE — Progress Notes (Signed)
 Remote pacemaker transmission.

## 2024-01-19 ENCOUNTER — Telehealth: Payer: Self-pay | Admitting: *Deleted

## 2024-01-19 NOTE — Telephone Encounter (Signed)
   Patient Name: Staton Markey  DOB: Feb 28, 1946 MRN: [id number]  Primary Cardiologist: Rollene Rotunda, MD  Chart reviewed as part of pre-operative protocol coverage.   Dental extractions of 1-2 teeth are considered low risk procedures per guidelines and generally do not require any specific cardiac clearance. It is also generally accepted that for extractions of 1-2 teeth and dental cleanings, there is no need to interrupt blood thinner therapy.  SBE prophylaxis is not required for the patient from a cardiac standpoint.  I will route this recommendation to the requesting party via Epic fax function and remove from pre-op pool.  Please call with questions.  Carlos Levering, NP 01/19/2024, 2:00 PM

## 2024-01-19 NOTE — Telephone Encounter (Signed)
   Pre-operative Risk Assessment    Patient Name: Russell Griffith  DOB: August 24, 1946 MRN: [id number]   Date of last office visit: 10/22/23 DR. HOCHREIN Date of next office visit: NONE  Request for Surgical Clearance    Procedure:  Dental Extraction - Amount of Teeth to be Pulled:  1 TOOTH BY SIMPLE EXTRACTION  Date of Surgery:  Clearance TBD                                Surgeon:  DR. Marcina Millard, DDS Surgeon's Group or Practice Name:  [facility] Phone number:  [phone] Fax number:  [phone]   Type of Clearance Requested:   - Medical  - Pharmacy:  Hold Apixaban (Eliquis)     Type of Anesthesia:  Local  WITH EPI   Additional requests/questions:    Elpidio Anis   01/19/2024, 12:51 PM

## 2024-01-26 ENCOUNTER — Encounter: Payer: Self-pay | Admitting: Internal Medicine

## 2024-01-31 ENCOUNTER — Telehealth: Payer: Self-pay

## 2024-01-31 NOTE — Telephone Encounter (Signed)
-----   Message from [facility] sent at 12/08/2023  9:18 AM EST ----- Regarding: due for labs Patient will be due for CBC, LFTs and AFP in March

## 2024-01-31 NOTE — Telephone Encounter (Signed)
 Lab orders are in. MyChart message to patient to go for labs

## 2024-02-01 ENCOUNTER — Other Ambulatory Visit (INDEPENDENT_AMBULATORY_CARE_PROVIDER_SITE_OTHER)

## 2024-02-01 ENCOUNTER — Encounter: Payer: Self-pay | Admitting: Gastroenterology

## 2024-02-01 DIAGNOSIS — K760 Fatty (change of) liver, not elsewhere classified: Secondary | ICD-10-CM | POA: Diagnosis not present

## 2024-02-01 DIAGNOSIS — K7460 Unspecified cirrhosis of liver: Secondary | ICD-10-CM

## 2024-02-01 DIAGNOSIS — Z7901 Long term (current) use of anticoagulants: Secondary | ICD-10-CM

## 2024-02-01 DIAGNOSIS — R188 Other ascites: Secondary | ICD-10-CM | POA: Diagnosis not present

## 2024-02-01 LAB — HEPATIC FUNCTION PANEL
ALT: 20 U/L (ref 0–53)
AST: 25 U/L (ref 0–37)
Albumin: 4.2 g/dL (ref 3.5–5.2)
Alkaline Phosphatase: 86 U/L (ref 39–117)
Bilirubin, Direct: 0.6 mg/dL — ABNORMAL HIGH (ref 0.0–0.3)
Total Bilirubin: 3.3 mg/dL — ABNORMAL HIGH (ref 0.2–1.2)
Total Protein: 6.5 g/dL (ref 6.0–8.3)

## 2024-02-01 LAB — CBC WITH DIFFERENTIAL/PLATELET
Basophils Absolute: 0 10*3/uL (ref 0.0–0.1)
Basophils Relative: 0.9 % (ref 0.0–3.0)
Eosinophils Absolute: 0.1 10*3/uL (ref 0.0–0.7)
Eosinophils Relative: 2 % (ref 0.0–5.0)
HCT: 38.6 % — ABNORMAL LOW (ref 39.0–52.0)
Hemoglobin: 13 g/dL (ref 13.0–17.0)
Lymphocytes Relative: 23.3 % (ref 12.0–46.0)
Lymphs Abs: 1.2 10*3/uL (ref 0.7–4.0)
MCHC: 33.6 g/dL (ref 30.0–36.0)
MCV: 87.5 fl (ref 78.0–100.0)
Monocytes Absolute: 0.9 10*3/uL (ref 0.1–1.0)
Monocytes Relative: 16.5 % — ABNORMAL HIGH (ref 3.0–12.0)
Neutro Abs: 3 10*3/uL (ref 1.4–7.7)
Neutrophils Relative %: 57.3 % (ref 43.0–77.0)
Platelets: 136 10*3/uL — ABNORMAL LOW (ref 150.0–400.0)
RBC: 4.41 Mil/uL (ref 4.22–5.81)
RDW: 14.4 % (ref 11.5–15.5)
WBC: 5.3 10*3/uL (ref 4.0–10.5)

## 2024-02-02 NOTE — Telephone Encounter (Signed)
 Requesting office resent request with updates to procedure:   PROCEDURE: 1 TOOTH SURGICALLY EXTRACTED WITH BONE GRAFT

## 2024-02-02 NOTE — Telephone Encounter (Signed)
    Primary Cardiologist: Rollene Rotunda, MD  Chart reviewed as part of pre-operative protocol coverage. Simple dental extractions are considered low risk procedures per guidelines and generally do not require any specific cardiac clearance. It is also generally accepted that for simple extractions and dental cleanings, there is no need to interrupt blood thinner therapy.   Per office protocol, patient no need to hold Eliquis as one tooth extraction consider low bleeding risk procedure.   SBE prophylaxis is not required for the patient.  I will route this recommendation to the requesting party via Epic fax function and remove from pre-op pool.  Please call with questions.  Denyce Robert, NP 02/02/2024, 9:36 AM

## 2024-02-02 NOTE — Telephone Encounter (Signed)
 Patient with diagnosis of PAF on Eliquis for anticoagulation.    Procedure: 1 TOOTH SURGICALLY EXTRACTED WITH BONE GRAFT  Date of procedure: TBD   CHA2DS2-VASc Score = 3   This indicates a 3.2% annual risk of stroke. The patient's score is based upon: CHF History: 0 HTN History: 0 Diabetes History: 0 Stroke History: 0 Vascular Disease History: 1 Age Score: 2 Gender Score: 0     CrCl 61 mL/min Platelet count 136 K    Per office protocol, patient no need to hold Eliquis as one tooth extraction consider low bleeding risk procedure.  **This guidance is not considered finalized until pre-operative APP has relayed final recommendations.**

## 2024-02-03 LAB — AFP TUMOR MARKER: AFP-Tumor Marker: 2.1 ng/mL (ref <6.1)

## 2024-02-08 NOTE — Telephone Encounter (Signed)
 Refaxed over clearance below.

## 2024-02-08 NOTE — Telephone Encounter (Signed)
 [facility] called in asking if fax can be sent over again, she did not receive. She confirmed fax number is correct - [phone]

## 2024-03-09 ENCOUNTER — Ambulatory Visit (INDEPENDENT_AMBULATORY_CARE_PROVIDER_SITE_OTHER): Payer: PPO

## 2024-03-09 DIAGNOSIS — I495 Sick sinus syndrome: Secondary | ICD-10-CM | POA: Diagnosis not present

## 2024-03-09 LAB — CUP PACEART REMOTE DEVICE CHECK
Battery Remaining Longevity: 167 mo
Battery Voltage: 3.1 V
Brady Statistic AP VP Percent: 0 %
Brady Statistic AP VS Percent: 0 %
Brady Statistic AS VP Percent: 0.88 %
Brady Statistic AS VS Percent: 99.12 %
Brady Statistic RA Percent Paced: 0 %
Brady Statistic RV Percent Paced: 0.88 %
Date Time Interrogation Session: 20250409223622
Implantable Lead Connection Status: 753985
Implantable Lead Connection Status: 753985
Implantable Lead Implant Date: 20130816
Implantable Lead Implant Date: 20130816
Implantable Lead Location: 753859
Implantable Lead Location: 753860
Implantable Lead Model: 5076
Implantable Lead Model: 5076
Implantable Pulse Generator Implant Date: 20231011
Lead Channel Impedance Value: 323 Ohm
Lead Channel Impedance Value: 475 Ohm
Lead Channel Impedance Value: 684 Ohm
Lead Channel Impedance Value: 912 Ohm
Lead Channel Pacing Threshold Amplitude: 2.5 V
Lead Channel Pacing Threshold Pulse Width: 0.4 ms
Lead Channel Sensing Intrinsic Amplitude: 0.375 mV
Lead Channel Sensing Intrinsic Amplitude: 14.5 mV
Lead Channel Sensing Intrinsic Amplitude: 14.5 mV
Lead Channel Setting Pacing Amplitude: 2.5 V
Lead Channel Setting Pacing Pulse Width: 1 ms
Lead Channel Setting Sensing Sensitivity: 0.9 mV
Zone Setting Status: 755011

## 2024-03-23 ENCOUNTER — Encounter: Payer: Self-pay | Admitting: Internal Medicine

## 2024-03-27 ENCOUNTER — Other Ambulatory Visit: Payer: Self-pay | Admitting: Internal Medicine

## 2024-04-09 ENCOUNTER — Other Ambulatory Visit: Payer: Self-pay | Admitting: Internal Medicine

## 2024-04-21 NOTE — Progress Notes (Signed)
 Remote pacemaker transmission.

## 2024-04-24 ENCOUNTER — Other Ambulatory Visit: Payer: Self-pay | Admitting: Internal Medicine

## 2024-04-24 DIAGNOSIS — I4821 Permanent atrial fibrillation: Secondary | ICD-10-CM

## 2024-04-25 NOTE — Telephone Encounter (Signed)
 Prescription refill request for Eliquis  received. Indication: Afib  Last office visit:10/22/23 (Hochrein)  Scr: 1.15 (08/31/23)  Age: 78 Weight: 79kg  Appropriate dose.Refill sent.

## 2024-04-26 NOTE — Progress Notes (Unsigned)
 Cardiology Office Note:   Date:  04/28/2024  ID:  Russell Griffith, DOB 24-Nov-1946, MRN [id number] PCP: Genia Kettering, MD  [facility] Providers Cardiologist:  Eilleen Grates, MD Electrophysiologist:  Richardo Chandler, MD {  History of Present Illness:   Russell Griffith is a 78 y.o. male who presents for evaluation of known CAD.    He has a history of coronary disease   his last cath was 2023.  He has native three-vessel coronary disease.  LIMA to the LAD was patent.  Saphenous vein graft to first diagonal had minimal luminal irregularities.  Saphenous vein graft to the PDA had a mid 80% lesion that was treated with angioplasty.  Saphenous vein graft OM 2 was occluded.  Saphenous vein graft to acute marginal was occluded.  He had stenting of his native circ and an SVG to the circ.  He was having some shortness of breath.  He had a perfusion study in 2024 with intermediate risk demonstrating moderate reduction in the apical to mid anterolateral inferolateral locations.  However, his symptoms of dyspnea improved and he was managed medically.  He has atrial fib He has had 2 ablations 1 at [facility] and a second by Dr. Nunzio Belch but has persistent atrial fibrillation.  He has been previously on Tikosyn .  He did not tolerate Multaq .  He has had GI bleeding.  He decided against the Watchman.    He returns for follow-up.   He has done well.  He is walking for exercise.  The patient denies any new symptoms such as chest discomfort, neck or arm discomfort. There has been no new shortness of breath, PND or orthopnea. There have been no reported palpitations, presyncope or syncope.   ROS: As stated in the HPI and negative for all other systems.  Studies Reviewed:    EKG:   NA   Risk Assessment/Calculations:    CHA2DS2-VASc Score = 3   This indicates a 3.2% annual risk of stroke. The patient's score is based upon: CHF History: 0 HTN History: 0 Diabetes History: 0 Stroke History:  0 Vascular Disease History: 1 Age Score: 2 Gender Score: 0    Physical Exam:   VS:  BP (!) 112/56 (BP Location: Right Arm, Patient Position: Sitting, Cuff Size: Normal)   Pulse (!) 58   Ht 5\' 11"  (1.803 m)   Wt 179 lb 3.2 oz (81.3 kg)   SpO2 94%   BMI 24.99 kg/m    Wt Readings from Last 3 Encounters:  04/28/24 179 lb 3.2 oz (81.3 kg)  12/30/23 174 lb 3.2 oz (79 kg)  12/15/23 173 lb (78.5 kg)     GEN: Well nourished, well developed in no acute distress NECK: No JVD; No carotid bruits CARDIAC: RRR, 3/6  murmurs, rubs, gallops RESPIRATORY:  Clear to auscultation without rales, wheezing or rhonchi  ABDOMEN: Soft, non-tender, non-distended EXTREMITIES:  No edema; No deformity   ASSESSMENT AND PLAN:   ATRIAL FIB:      PACEMAKER PLACEMENT:   I reviewed the results of the device interrogation in April.  We are reestablishing him with a new EP on Dr. Doyle Generous retirement.    CAD:   The patient has no new sypmtoms.  No further cardiovascular testing is indicated.  We will continue with aggressive risk reduction and meds as listed.  AS:  This was mild in December in 2024.  I will follow this clinically and probably repeat an echo in 2026.   DYSLIPIDEMIA: LDL was  53.  No change in therapy.  CAROTID STENOSIS: He had mild stenosis and we will follow-up as needed.     Follow up with me in one year.   Signed, Eilleen Grates, MD

## 2024-04-28 ENCOUNTER — Ambulatory Visit: Admitting: Cardiology

## 2024-04-28 ENCOUNTER — Ambulatory Visit: Attending: Cardiology | Admitting: Cardiology

## 2024-04-28 ENCOUNTER — Encounter: Payer: Self-pay | Admitting: Cardiology

## 2024-04-28 VITALS — BP 112/56 | HR 58 | Ht 71.0 in | Wt 179.2 lb

## 2024-04-28 DIAGNOSIS — I350 Nonrheumatic aortic (valve) stenosis: Secondary | ICD-10-CM

## 2024-04-28 DIAGNOSIS — I480 Paroxysmal atrial fibrillation: Secondary | ICD-10-CM

## 2024-04-28 DIAGNOSIS — E785 Hyperlipidemia, unspecified: Secondary | ICD-10-CM | POA: Diagnosis not present

## 2024-04-28 DIAGNOSIS — I2583 Coronary atherosclerosis due to lipid rich plaque: Secondary | ICD-10-CM

## 2024-04-28 DIAGNOSIS — I2510 Atherosclerotic heart disease of native coronary artery without angina pectoris: Secondary | ICD-10-CM

## 2024-04-28 NOTE — Patient Instructions (Signed)
 Medication Instructions:  Your physician recommends that you continue on your current medications as directed. Please refer to the Current Medication list given to you today.  *If you need a refill on your cardiac medications before your next appointment, please call your pharmacy*  Lab Work: NONE If you have labs (blood work) drawn today and your tests are completely normal, you will receive your results only by: MyChart Message (if you have MyChart) OR A paper copy in the mail If you have any lab test that is abnormal or we need to change your treatment, we will call you to review the results.  Testing/Procedures: NONE  Follow-Up: At [facility], you and your health needs are our priority.  As part of our continuing mission to provide you with exceptional heart care, our providers are all part of one team.  This team includes your primary Cardiologist (physician) and Advanced Practice Providers or APPs (Physician Assistants and Nurse Practitioners) who all work together to provide you with the care you need, when you need it.  Your next appointment:   You have been referred to our EP team. They will reach out to make an appointment in roughly 6 months. You will see Hochrein in 1 year   We recommend signing up for the patient portal called "MyChart".  Sign up information is provided on this After Visit Summary.  MyChart is used to connect with patients for Virtual Visits (Telemedicine).  Patients are able to view lab/test results, encounter notes, upcoming appointments, etc.  Non-urgent messages can be sent to your provider as well.   To learn more about what you can do with MyChart, go to [email or web address].

## 2024-05-09 ENCOUNTER — Other Ambulatory Visit: Payer: Self-pay | Admitting: Cardiology

## 2024-05-28 ENCOUNTER — Other Ambulatory Visit: Payer: Self-pay | Admitting: Internal Medicine

## 2024-05-28 DIAGNOSIS — E785 Hyperlipidemia, unspecified: Secondary | ICD-10-CM

## 2024-05-30 ENCOUNTER — Telehealth: Payer: Self-pay

## 2024-05-30 DIAGNOSIS — K7460 Unspecified cirrhosis of liver: Secondary | ICD-10-CM

## 2024-05-30 NOTE — Telephone Encounter (Signed)
 Order placed fro RUQ U/S.  Message to patient to expect call from schedulers to be scheduled in early July. Msg to schedulers to call patient

## 2024-05-30 NOTE — Telephone Encounter (Signed)
-----   Message from [facility] sent at 12/08/2023  9:19 AM EST ----- Regarding: RUQ U/S Patient will be due for RUQ U/S in July

## 2024-06-06 ENCOUNTER — Ambulatory Visit: Payer: PPO

## 2024-06-07 ENCOUNTER — Ambulatory Visit

## 2024-06-08 ENCOUNTER — Ambulatory Visit: Payer: PPO

## 2024-06-08 ENCOUNTER — Ambulatory Visit (HOSPITAL_COMMUNITY)
Admission: RE | Admit: 2024-06-08 | Discharge: 2024-06-08 | Disposition: A | Discharge status: Home / Self Care | Source: Ambulatory Visit | Attending: Gastroenterology | Admitting: Gastroenterology

## 2024-06-08 DIAGNOSIS — R188 Other ascites: Secondary | ICD-10-CM | POA: Insufficient documentation

## 2024-06-08 DIAGNOSIS — K7460 Unspecified cirrhosis of liver: Secondary | ICD-10-CM | POA: Insufficient documentation

## 2024-06-08 DIAGNOSIS — I495 Sick sinus syndrome: Secondary | ICD-10-CM

## 2024-06-09 ENCOUNTER — Ambulatory Visit: Payer: Self-pay | Admitting: Gastroenterology

## 2024-06-09 LAB — CUP PACEART REMOTE DEVICE CHECK
Battery Remaining Longevity: 164 mo
Battery Voltage: 3.08 V
Brady Statistic AP VP Percent: 0 %
Brady Statistic AP VS Percent: 0 %
Brady Statistic AS VP Percent: 4.43 %
Brady Statistic AS VS Percent: 95.57 %
Brady Statistic RA Percent Paced: 0 %
Brady Statistic RV Percent Paced: 4.43 %
Date Time Interrogation Session: 20250710193742
Implantable Lead Connection Status: 753985
Implantable Lead Connection Status: 753985
Implantable Lead Implant Date: 20130816
Implantable Lead Implant Date: 20130816
Implantable Lead Location: 753859
Implantable Lead Location: 753860
Implantable Lead Model: 5076
Implantable Lead Model: 5076
Implantable Pulse Generator Implant Date: 20231011
Lead Channel Impedance Value: 323 Ohm
Lead Channel Impedance Value: 475 Ohm
Lead Channel Impedance Value: 703 Ohm
Lead Channel Impedance Value: 912 Ohm
Lead Channel Pacing Threshold Amplitude: 2.5 V
Lead Channel Pacing Threshold Pulse Width: 0.4 ms
Lead Channel Sensing Intrinsic Amplitude: 0.375 mV
Lead Channel Sensing Intrinsic Amplitude: 14.25 mV
Lead Channel Sensing Intrinsic Amplitude: 14.25 mV
Lead Channel Setting Pacing Amplitude: 2.5 V
Lead Channel Setting Pacing Pulse Width: 1 ms
Lead Channel Setting Sensing Sensitivity: 0.9 mV
Zone Setting Status: 755011

## 2024-06-12 ENCOUNTER — Telehealth: Payer: Self-pay | Admitting: *Deleted

## 2024-06-12 NOTE — Telephone Encounter (Signed)
   Pre-operative Risk Assessment    Patient Name: Russell Griffith  DOB: [birth year] MRN: [id number]   Date of last office visit: 04/28/24 DR. HOCHREIN Date of next office visit: 11/01/24 DR. LAMBERT  Request for Surgical Clearance    Procedure:  PLACEMENT OF DENTAL IMPLANT, INCISION AND REFLECTION OF A MUCOGINGIVAL TISSUE FLAP,   Date of Surgery:  Clearance TBD                                Surgeon:  DR. PRENTICE SILVERSMITH, DDS Surgeon's Group or Practice Name:  [facility] Phone number:  [phone] Fax number:  [phone]   Type of Clearance Requested:   - Medical  - Pharmacy:  Hold Apixaban  (Eliquis )   PT HAS PACEMAKER AS WELL, I WILL SEND THIS TO DEVICE CLINIC AS WELL.   Type of Anesthesia:  Local WITH EPI AND REQUIRING SUTURING AS WELL    Additional requests/questions:    Signed, Johnross Nabozny   06/12/2024, 5:42 PM

## 2024-06-13 ENCOUNTER — Encounter: Payer: Self-pay | Admitting: Cardiology

## 2024-06-13 NOTE — Telephone Encounter (Signed)
 Device clearance given and faxed 06/13/24.  See separate clearance encounter.

## 2024-06-13 NOTE — Progress Notes (Signed)
 PERIOPERATIVE PRESCRIPTION FOR IMPLANTED CARDIAC DEVICE PROGRAMMING  Patient Information: Name:  Judson Tsan  DOB:  1946-06-18  MRN:  [id number]   Request for Surgical Clearance    Procedure:  PLACEMENT OF DENTAL IMPLANT, INCISION AND REFLECTION OF A MUCOGINGIVAL TISSUE FLAP,    Date of Surgery:  Clearance TBD                                  Surgeon:  DR. PRENTICE SILVERSMITH, DDS Surgeon's Group or Practice Name:  [facility] Phone number:  [phone] Fax number:  [phone] Device Information:  Clinic EP Physician:  Soyla Norton, MD   Device Type:  Pacemaker Manufacturer and Phone #:  [employer]: [id number] Pacemaker Dependent?:  No. Date of Last Device Check:  In-Clinic 10/01/23, Remote 06/08/24 Normal Device Function?:  Yes.    Electrophysiologist's Recommendations:  Have magnet available. Provide continuous ECG monitoring when magnet is used or reprogramming is to be performed.  Procedure may interfere with device function.  Magnet should be placed over device during procedure. (If planning to use any sort of electrical pulse and/or cautery recommend magnet. Otherwise, should be no interference with device function.)  Per Device Clinic Standing Orders, Rozelle JONELLE Banter, RN  11:27 AM 06/13/2024

## 2024-06-14 ENCOUNTER — Ambulatory Visit: Payer: Self-pay | Admitting: Cardiology

## 2024-06-19 NOTE — Telephone Encounter (Signed)
 Patient with diagnosis of afib on Eliquis  for anticoagulation.    Procedure:  PLACEMENT OF DENTAL IMPLANT, INCISION AND REFLECTION OF A MUCOGINGIVAL TISSUE FLAP,  Date of procedure: TBD   CHA2DS2-VASc Score = 3   This indicates a 3.2% annual risk of stroke. The patient's score is based upon: CHF History: 0 HTN History: 0 Diabetes History: 0 Stroke History: 0 Vascular Disease History: 1 Age Score: 2 Gender Score: 0      CrCl 62 ml/min Platelet count 136  Patient has not had an Afib/aflutter ablation within the last 3 months or DCCV within the last 30 days  Patient does NOT require pre-op antibiotics for dental procedure.  Per office protocol, patient can hold Eliquis  for 1 day prior to procedure.    **This guidance is not considered finalized until pre-operative APP has relayed final recommendations.**

## 2024-06-19 NOTE — Telephone Encounter (Signed)
   Primary Cardiologist: Lynwood Schilling, MD  Chart reviewed as part of pre-operative protocol coverage. Given past medical history and time since last visit, based on ACC/AHA guidelines, Russell Griffith would be at acceptable risk for the planned procedure without further cardiovascular testing.   Patient should contact our office if he is having new symptoms that are concerning from a cardiac perspective to arrange a follow-up appointment.    Patient does NOT require pre-op antibiotics for dental procedure. Per office protocol, patient can hold Eliquis  for 1 day prior to procedure.    I will route this recommendation to the requesting party via Epic fax function and remove from pre-op pool.  Please call with questions.  Rosaline EMERSON Bane, NP-C 06/19/2024, 2:59 PM [address] [place], [place] [address] Office [phone] Fax [phone]

## 2024-06-29 ENCOUNTER — Ambulatory Visit

## 2024-06-29 ENCOUNTER — Telehealth: Payer: Self-pay

## 2024-06-29 VITALS — Ht 71.0 in | Wt 179.0 lb

## 2024-06-29 DIAGNOSIS — R739 Hyperglycemia, unspecified: Secondary | ICD-10-CM

## 2024-06-29 DIAGNOSIS — R7989 Other specified abnormal findings of blood chemistry: Secondary | ICD-10-CM

## 2024-06-29 DIAGNOSIS — Z0000 Encounter for general adult medical examination without abnormal findings: Secondary | ICD-10-CM

## 2024-06-29 NOTE — Progress Notes (Cosign Needed)
 Subjective:   Russell Griffith is a 78 y.o. who presents for a Medicare Wellness preventive visit.  As a reminder, Annual Wellness Visits don't include a physical exam, and some assessments may be limited, especially if this visit is performed virtually. We may recommend an in-person follow-up visit with your provider if needed.  Visit Complete: Virtual I connected with  Russell Griffith on 06/29/24 by a audio enabled telemedicine application and verified that I am speaking with the correct person using two identifiers.  Patient Location: Home  Provider Location: Office/Clinic  I discussed the limitations of evaluation and management by telemedicine. The patient expressed understanding and agreed to proceed.  Vital Signs: Because this visit was a virtual/telehealth visit, some criteria may be missing or patient reported. Any vitals not documented were not able to be obtained and vitals that have been documented are patient reported.  VideoDeclined- This patient declined Librarian, academic. Therefore the visit was completed with audio only.  Persons Participating in Visit: Patient.  AWV Questionnaire: Yes: Patient Medicare AWV questionnaire was completed by the patient on 06/25/2024; I have confirmed that all information answered by patient is correct and no changes since this date.        Objective:    Today's Vitals   06/25/24 0845 06/29/24 1429  Weight:  179 lb (81.2 kg)  Height:  5' 11 (1.803 m)  PainSc: 1     Body mass index is 24.97 kg/m.     06/29/2024    2:34 PM 06/02/2023    3:04 PM 09/09/2022   11:16 AM 06/07/2022    6:00 PM 06/05/2022    2:42 PM 05/22/2022    6:17 AM 01/28/2022    2:07 PM  Advanced Directives  Does Patient Have a Medical Advance Directive? Yes Yes Yes Yes Yes Yes No  Type of Estate agent of [place];Living will Healthcare Power of [place];Living will Healthcare Power of [place];Living will  Healthcare Power of [employer] of [place];Living will Healthcare Power of [place];Living will   Does patient want to make changes to medical advance directive?   No - Patient declined No - Patient declined     Copy of Healthcare Power of Attorney in Chart? No - copy requested No - copy requested  No - copy requested       Current Medications (verified) Outpatient Encounter Medications as of 06/29/2024  Medication Sig   acetaminophen  (TYLENOL ) 500 MG tablet Take 1,000 mg by mouth every 6 (six) hours as needed for moderate pain, mild pain or headache.   atorvastatin  (LIPITOR) 20 MG tablet Take 1 tablet (20 mg total) by mouth daily.   Carboxymethylcellulose Sodium (DRY EYE RELIEF OP) Place 1 drop into both eyes daily as needed (Dry eye).   Cholecalciferol 25 MCG (1000 UT) capsule Take 1,000 Units by mouth daily with supper.    ELIQUIS  5 MG TABS tablet Take 1 tablet (5 mg total) by mouth 2 (two) times daily.   furosemide  (LASIX ) 20 MG tablet Take 1 tablet once daily as needed   isosorbide  mononitrate (IMDUR ) 60 MG 24 hr tablet Take 1 tablet (60 mg total) by mouth daily.   losartan  (COZAAR ) 25 MG tablet TAKE ONE TABLET BY MOUTH DAILY   NONFORMULARY OR COMPOUNDED ITEM Apply 1 application topically daily as needed (rash). Cetaphil + triamcinolone  cream   pantoprazole  (PROTONIX ) 40 MG tablet TAKE ONE TABLET BY MOUTH TWICE DAILY   nitroGLYCERIN  (NITROSTAT ) 0.4 MG SL tablet Place  1 tablet (0.4 mg total) under the tongue every 5 (five) minutes as needed.   No facility-administered encounter medications on file as of 06/29/2024.    Allergies (verified) Cayenne, Niacin and related, Spironolactone , Sulfa antibiotics, and Sulfonamide derivatives   History: Past Medical History:  Diagnosis Date   Anemia    on Accrufer    Anxiety    Aortic stenosis    Mild, echo, April, 2014   Blood transfusion without reported diagnosis 2023   BPH (benign prostatic hyperplasia)    CAD (coronary  artery disease)    a. s/p CABG   Carotid artery disease (HCC)    Cataract 2020   bilateral sx   Cirrhosis of liver (HCC)    Colonic polyp    Diverticulosis    Elevated bilirubin    Mild chronic elevation, 2.0 January, 2011 stable   GERD (gastroesophageal reflux disease)    Barrett's esophagus-on meds   Gilbert's disease    History of kidney stones    HTN (hypertension)    on meds   Hyperlipidemia    Low HDL-on meds   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HCC)    Left  lung chest x-ray July, 2013   Persistent atrial fibrillation (HCC)    a. s/p PVI at [facility]   Precancerous lesion    Forehead   Primary osteoarthritis of left knee    Mild-bilateral LE   Prolapsed internal hemorrhoids, grade 3 08/12/2015   PVC's (premature ventricular contractions)    Seasonal allergies    Tubular adenoma of colon    Past Surgical History:  Procedure Laterality Date   ABLATION     PVI at [facility]   ATRIAL FIBRILLATION ABLATION N/A 01/25/2019   Procedure: ATRIAL FIBRILLATION ABLATION;  Surgeon: Kelsie Agent, MD;  Location: [facility] INVASIVE CV LAB;  Service: Cardiovascular;  Laterality: N/A;   CARDIOVERSION N/A 10/14/2018   Procedure: CARDIOVERSION;  Surgeon: Okey Vina GAILS, MD;  Location: [facility] ENDOSCOPY;  Service: Cardiovascular;  Laterality: N/A;   CARDIOVERSION N/A 08/18/2019   Procedure: CARDIOVERSION;  Surgeon: Jeffrie Oneil BROCKS, MD;  Location: [facility] ENDOSCOPY;  Service: Cardiovascular;  Laterality: N/A;   COLONOSCOPY     CORONARY ARTERY BYPASS GRAFT  2000   CABG X5   CORONARY PRESSURE/FFR STUDY N/A 01/05/2018   Procedure: INTRAVASCULAR PRESSURE WIRE/FFR STUDY;  Surgeon: Wonda Sharper, MD;  Location: [facility] INVASIVE CV LAB;  Service: Cardiovascular;  Laterality: N/A;   CORONARY STENT INTERVENTION N/A 05/22/2022   Procedure: CORONARY STENT INTERVENTION;  Surgeon: Dann Candyce RAMAN, MD;  Location: [facility] INVASIVE CV LAB;  Service: Cardiovascular;  Laterality: N/A;   CORONARY ULTRASOUND/IVUS N/A 05/22/2022   Procedure: Intravascular  Ultrasound/IVUS;  Surgeon: Dann Candyce RAMAN, MD;  Location: [facility] INVASIVE CV LAB;  Service: Cardiovascular;  Laterality: N/A;   CYSTOSCOPY WITH RETROGRADE PYELOGRAM, URETEROSCOPY AND STENT PLACEMENT Bilateral 09/04/2020   Procedure: CYSTOSCOPY WITH BILATERAL RETROGRADE PYELOGRAM, RIGHT URETEROSCOPY AND RIGHT  STENT PLACEMENT;  Surgeon: Matilda Senior, MD;  Location: [facility] ORS;  Service: Urology;  Laterality: Bilateral;   ENTEROSCOPY N/A 06/06/2022   Procedure: ENTEROSCOPY;  Surgeon: Leigh Elspeth SQUIBB, MD;  Location: [facility] ENDOSCOPY;  Service: Gastroenterology;  Laterality: N/A;   ESOPHAGOGASTRODUODENOSCOPY     HEAD & NECK SKIN LESION EXCISIONAL BIOPSY     HEMORRHOID BANDING     HEMOSTASIS CLIP PLACEMENT  06/06/2022   Procedure: HEMOSTASIS CLIP PLACEMENT;  Surgeon: Leigh Elspeth SQUIBB, MD;  Location: [facility] ENDOSCOPY;  Service: Gastroenterology;;   HOT HEMOSTASIS N/A 06/06/2022   Procedure: HOT HEMOSTASIS (ARGON PLASMA COAGULATION/BICAP);  Surgeon: Leigh Elspeth SQUIBB, MD;  Location: [facility] ENDOSCOPY;  Service: Gastroenterology;  Laterality: N/A;   IR PARACENTESIS  07/07/2023   LEFT HEART CATH AND CORS/GRAFTS ANGIOGRAPHY N/A 01/05/2018   Procedure: LEFT HEART CATH AND CORS/GRAFTS ANGIOGRAPHY;  Surgeon: Wonda Sharper, MD;  Location: [facility] INVASIVE CV LAB;  Service: Cardiovascular;  Laterality: N/A;   PERMANENT PACEMAKER INSERTION N/A 07/15/2012   Procedure: PERMANENT PACEMAKER INSERTION;  Surgeon: Elspeth JAYSON Sage, MD;  Location: [facility] CATH LAB;  Service: Cardiovascular;  Laterality: N/A;   PPM GENERATOR CHANGEOUT N/A 09/09/2022   Procedure: PPM GENERATOR CHANGEOUT;  Surgeon: Sage Elspeth JAYSON, MD;  Location: [facility] INVASIVE CV LAB;  Service: Cardiovascular;  Laterality: N/A;   RIGHT/LEFT HEART CATH AND CORONARY/GRAFT ANGIOGRAPHY N/A 05/22/2022   Procedure: RIGHT/LEFT HEART CATH AND CORONARY/GRAFT ANGIOGRAPHY;  Surgeon: Dann Candyce RAMAN, MD;  Location: [facility] INVASIVE CV LAB;  Service: Cardiovascular;  Laterality: N/A;    rotator cuff surgery Right 2017   SCLEROTHERAPY  06/06/2022   Procedure: SCLEROTHERAPY;  Surgeon: Leigh Elspeth SQUIBB, MD;  Location: [facility] ENDOSCOPY;  Service: Gastroenterology;;   TONSILLECTOMY  1956   WISDOM TOOTH EXTRACTION     Family History  Problem Relation Age of Onset   Multiple myeloma Mother    Diabetes Father    Renal Disease Father    Coronary artery disease Other 59   Diabetes Other    Hyperlipidemia Other    Hypertension Other    Colon cancer Neg Hx    Esophageal cancer Neg Hx    Stomach cancer Neg Hx    Rectal cancer Neg Hx    Colon polyps Neg Hx    Social History   Socioeconomic History   Marital status: Married    Spouse name: Not on file   Number of children: 2   Years of education: 18   Highest education level: Master's degree (e.g., MA, MS, MEng, MEd, MSW, MBA)  Occupational History   Occupation: Sales promotion account executive: [employer]    Comment: Control and instrumentation engineer foundation  Tobacco Use   Smoking status: Never   Smokeless tobacco: Never  Vaping Use   Vaping status: Never Used  Substance and Sexual Activity   Alcohol use: Not Currently   Drug use: No   Sexual activity: Yes    Partners: Female  Other Topics Concern   Not on file  Social History Narrative   [employer]el [employer] [place], [place].  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  2 daughters-'75, '79; 1 grandchild; step-daughter and step grandson.  Regular exercise-yes, runs 1.5-2 mi 4x/wk, also eliptical      Patient signed a Designated Party Release to allow his wife, Russell Griffith, to have access to his medical records/ information.      Lives with wife/2025   Social Drivers of Health   Financial Resource Strain: Low Risk  (06/25/2024)   Overall Financial Resource Strain (CARDIA)    Difficulty of Paying Living Expenses: Not hard at all  Food Insecurity: No Food Insecurity (06/25/2024)   Hunger Vital Sign     Worried About Running Out of Food in the Last Year: Never true    Ran Out of Food in the Last Year: Never true  Transportation Needs: Unknown (06/25/2024)   PRAPARE - Administrator, Civil Service (Medical): No    Lack of Transportation (Non-Medical): Not on file  Physical Activity: Sufficiently Active (06/25/2024)   Exercise Vital Sign  Days of Exercise per Week: 5 days    Minutes of Exercise per Session: 40 min  Stress: No Stress Concern Present (06/25/2024)   [employer] of Occupational Health - Occupational Stress Questionnaire    Feeling of Stress: Only a little  Social Connections: Socially Integrated (06/25/2024)   Social Connection and Isolation Panel    Frequency of Communication with Friends and Family: Twice a week    Frequency of Social Gatherings with Friends and Family: Once a week    Attends Religious Services: 1 to 4 times per year    Active Member of [employer] or Organizations: Yes    Attends Engineer, structural: More than 4 times per year    Marital Status: Married    Tobacco Counseling Counseling given: Not Answered    Clinical Intake:  Pre-visit preparation completed: Yes  Pain : 0-10 Pain Score: 1  Pain Type: Other (Comment) Pain Location: Knee (per pt-due to working out) Pain Orientation: Right Pain Descriptors / Indicators: Aching, Discomfort Pain Onset: More than a month ago Pain Frequency: Intermittent Pain Relieving Factors: Iced it  Pain Relieving Factors: Iced it  BMI - recorded: 24.97 Nutritional Status: BMI of 19-24  Normal  Lab Results  Component Value Date   HGBA1C 5.7 03/11/2023   HGBA1C 5.8 07/30/2015   HGBA1C 6.0 09/04/2014     How often do you need to have someone help you when you read instructions, pamphlets, or other written materials from your doctor or pharmacy?: 1 - Never  Interpreter Needed?: No  Information entered by :: Russell Griffith, RMA   Activities of Daily Living      06/25/2024     8:45 AM  In your present state of health, do you have any difficulty performing the following activities:  Hearing? 0  Vision? 0  Difficulty concentrating or making decisions? 0  Walking or climbing stairs? 0  Dressing or bathing? 0  Doing errands, shopping? 0  Preparing Food and eating ? N  Using the Toilet? N  In the past six months, have you accidently leaked urine? N  Do you have problems with loss of bowel control? N  Managing your Medications? N  Managing your Finances? N  Housekeeping or managing your Housekeeping? N    Patient Care Team: [name], Karlynn GAILS, MD as PCP - General (Internal Medicine) Fernande Elspeth BROCKS, MD as PCP - Electrophysiology (Cardiology) Lavona Agent, MD as PCP - Cardiology (Cardiology) Harvey Seltzer, MD (Family Medicine) Fernande Elspeth BROCKS, MD (Cardiology) Joshua Blamer, MD as Consulting Physician (Dermatology) Lavona Agent, MD as Consulting Physician (Cardiology) Matilda Senior, MD as Consulting Physician (Urology) Leslee Reusing, MD as Consulting Physician (Ophthalmology) Armbruster, Elspeth SQUIBB, MD as Consulting Physician (Gastroenterology)  I have updated your Care Teams any recent Medical Services you may have received from other providers in the past year.     Assessment:   This is a routine wellness examination for Russell Griffith.  Hearing/Vision screen Hearing Screening - Comments:: Denies hearing difficulties   Vision Screening - Comments:: Denies vision issues./ [place] [facility]    Goals Addressed             This Visit's Progress    My healthcare goal is to maintain my health by going to gastroenterology for check up on abdominal pain.   On track      Depression Screen     06/29/2024    2:35 PM 08/26/2023    1:55 PM 06/02/2023    3:05 PM 02/11/2023  10:58 AM 12/31/2022    2:24 PM 12/25/2022    8:02 AM 12/18/2022    8:23 AM  PHQ 2/9 Scores  PHQ - 2 Score 0 1 0 0 0 0 0  PHQ- 9 Score 1  0 0 0 3 6    Fall Risk      06/25/2024    8:45 AM 08/26/2023    1:55 PM 06/02/2023    3:05 PM 06/01/2023   12:59 PM 02/11/2023   10:58 AM  Fall Risk   Falls in the past year? 0 0 0 0 0  Number falls in past yr: 0 0 0  0  Injury with Fall? 0 0 0  0  Risk for fall due to :  No Fall Risks No Fall Risks  No Fall Risks  Follow up Falls evaluation completed;Falls prevention discussed Falls evaluation completed Falls prevention discussed  Falls evaluation completed    MEDICARE RISK AT HOME:  Medicare Risk at Home Any stairs in or around the home?: (Patient-Rptd) Yes If so, are there any without handrails?: (Patient-Rptd) Yes Home free of loose throw rugs in walkways, pet beds, electrical cords, etc?: (Patient-Rptd) Yes Adequate lighting in your home to reduce risk of falls?: (Patient-Rptd) Yes Life alert?: (Patient-Rptd) No Use of a cane, walker or w/c?: (Patient-Rptd) No Grab bars in the bathroom?: (Patient-Rptd) No Shower chair or bench in shower?: (Patient-Rptd) Yes Elevated toilet seat or a handicapped toilet?: (Patient-Rptd) No  TIMED UP AND GO:  Was the test performed?  No  Cognitive Function: Declined/Normal: No cognitive concerns noted by patient or family. Patient alert, oriented, able to answer questions appropriately and recall recent events. No signs of memory loss or confusion.        06/02/2023    3:08 PM  6CIT Screen  What Year? 0 points  What month? 0 points  What time? 0 points  Count back from 20 0 points  Months in reverse 0 points  Repeat phrase 0 points  Total Score 0 points    Immunizations Immunization History  Administered Date(s) Administered   Fluad Quad(high Dose 65+) 08/23/2019, 09/19/2020   Fluad Trivalent(High Dose 65+) 08/26/2023   Influenza Whole 08/31/2009   Influenza, High Dose Seasonal PF 08/12/2017, 09/23/2021, 09/21/2022   Influenza,inj,Quad PF,6+ Mos 08/06/2016   Influenza-Unspecified 08/24/2014, 08/30/2015   PFIZER Comirnaty(Gray Top)Covid-19 Tri-Sucrose Vaccine  04/24/2021   PFIZER(Purple Top)SARS-COV-2 Vaccination 12/17/2019, 01/07/2020   Pfizer Covid-19 Vaccine Bivalent Booster 73yrs & up 09/23/2021   Pfizer(Comirnaty)Fall Seasonal Vaccine 12 years and older 09/21/2022   Pneumococcal Conjugate-13 09/13/2013   Pneumococcal Polysaccharide-23 03/28/2009, 07/23/2015   Td 03/28/2009   Tdap 08/23/2019   Zoster Recombinant(Shingrix) 03/08/2018, 06/17/2018   Zoster, Live 09/13/2013    Screening Tests Health Maintenance  Topic Date Due   Hepatitis B Vaccines (1 of 3 - Risk 3-dose series) Never done   COVID-19 Vaccine (6 - 2024-25 season) 08/01/2023   INFLUENZA VACCINE  06/30/2024   Medicare Annual Wellness (AWV)  06/29/2025   Colonoscopy  06/26/2027   DTaP/Tdap/Td (3 - Td or Tdap) 08/22/2029   Pneumococcal Vaccine: 50+ Years  Completed   Hepatitis C Screening  Completed   Zoster Vaccines- Shingrix  Completed   HPV VACCINES  Aged Out   Meningococcal B Vaccine  Aged Out    Health Maintenance  Health Maintenance Due  Topic Date Due   Hepatitis B Vaccines (1 of 3 - Risk 3-dose series) Never done   COVID-19 Vaccine (6 - 2024-25 season) 08/01/2023  Health Maintenance Items Addressed: See Nurse Notes at the end of this note  Additional Screening:  Vision Screening: Recommended annual ophthalmology exams for early detection of glaucoma and other disorders of the eye. Would you like a referral to an eye doctor? No    Dental Screening: Recommended annual dental exams for proper oral hygiene  Community Resource Referral / Chronic Care Management: CRR required this visit?  No   CCM required this visit?  No   Plan:    I have personally reviewed and noted the following in the patient's chart:   Medical and social history Use of alcohol, tobacco or illicit drugs  Current medications and supplements including opioid prescriptions. Patient is not currently taking opioid prescriptions. Functional ability and status Nutritional  status Physical activity Advanced directives List of other physicians Hospitalizations, surgeries, and ER visits in previous 12 months Vitals Screenings to include cognitive, depression, and falls Referrals and appointments  In addition, I have reviewed and discussed with patient certain preventive protocols, quality metrics, and best practice recommendations. A written personalized care plan for preventive services as well as general preventive health recommendations were provided to patient.   Gaius Ishaq L Kamera Dubas, CMA   06/29/2024   After Visit Summary: (MyChart) Due to this being a telephonic visit, the after visit summary with patients personalized plan was offered to patient via MyChart   Notes: Patient is due for Hepatitis B vaccines.  He would like to discuss during his next office visit with PCP.  Patient is up to date on all health maintenance with no concerns to address today.  Medical screening examination/treatment/procedure(s) were performed by non-physician practitioner and as supervising physician I was immediately available for consultation/collaboration.  I agree with above. Karlynn Noel, MD

## 2024-06-29 NOTE — Telephone Encounter (Signed)
 Copied from CRM [id number]. Topic: Clinical - Request for Lab/Test Order >> Jun 29, 2024  2:54 PM Russell Griffith wrote: Reason for CRM: Patient would like order placed(Complete panel requested) before physical(07/11/24). Please contact patient to schedule one orders are placed.  CB#[phone]

## 2024-06-29 NOTE — Patient Instructions (Signed)
 [name] , Thank you for taking time out of your busy schedule to complete your Annual Wellness Visit with me. I enjoyed our conversation and look forward to speaking with you again next year. I, as well as your care team,  appreciate your ongoing commitment to your health goals. Please review the following plan we discussed and let me know if I can assist you in the future. Your Game plan/ To Do List    Referrals: If you haven't heard from the office you've been referred to, please reach out to them at the phone provided.   Follow up Visits: We will see or speak with you next year for your Next Medicare AWV with our clinical staff Have you seen your provider in the last 6 months (3 months if uncontrolled diabetes)? No.  Last office visit 12/30/2023.  Clinician Recommendations:  Aim for 30 minutes of exercise or brisk walking, 6-8 glasses of water, and 5 servings of fruits and vegetables each day. Please discuss the Hepatitis B vaccines with Dr. Garald during your next office visit.  Keep up the good work.      This is a list of the screenings recommended for you:  Health Maintenance  Topic Date Due   Hepatitis B Vaccine (1 of 3 - Risk 3-dose series) Never done   COVID-19 Vaccine (6 - 2024-25 season) 08/01/2023   Flu Shot  06/30/2024   Medicare Annual Wellness Visit  06/29/2025   Colon Cancer Screening  06/26/2027   DTaP/Tdap/Td vaccine (3 - Td or Tdap) 08/22/2029   Pneumococcal Vaccine for age over 69  Completed   Hepatitis C Screening  Completed   Zoster (Shingles) Vaccine  Completed   HPV Vaccine  Aged Out   Meningitis B Vaccine  Aged Out    Advanced directives: (Copy Requested) Please bring a copy of your health care power of attorney and living will to the office to be added to your chart at your convenience. You can mail to [facility] [address] [place], [place] [address] or email to [email or web address] Advance Care Planning is important because  it:  [x]  Makes sure you receive the medical care that is consistent with your values, goals, and preferences  [x]  It provides guidance to your family and loved ones and reduces their decisional burden about whether or not they are making the right decisions based on your wishes.  Follow the link provided in your after visit summary or read over the paperwork we have mailed to you to help you started getting your Advance Directives in place. If you need assistance in completing these, please reach out to us  so that we can help you!  See attachments for Preventive Care and Fall Prevention Tips.

## 2024-06-30 NOTE — Telephone Encounter (Signed)
 Please schedule pt for a lab apptmnt upon his call back to the clinic.

## 2024-07-04 ENCOUNTER — Other Ambulatory Visit: Payer: Self-pay

## 2024-07-04 ENCOUNTER — Ambulatory Visit: Admitting: Sports Medicine

## 2024-07-04 VITALS — BP 110/62 | Ht 71.0 in | Wt 172.0 lb

## 2024-07-04 DIAGNOSIS — M216X2 Other acquired deformities of left foot: Secondary | ICD-10-CM | POA: Diagnosis not present

## 2024-07-04 DIAGNOSIS — M25561 Pain in right knee: Secondary | ICD-10-CM | POA: Diagnosis not present

## 2024-07-04 DIAGNOSIS — M216X1 Other acquired deformities of right foot: Secondary | ICD-10-CM | POA: Diagnosis not present

## 2024-07-04 DIAGNOSIS — S76109A Unspecified injury of unspecified quadriceps muscle, fascia and tendon, initial encounter: Secondary | ICD-10-CM | POA: Diagnosis not present

## 2024-07-04 DIAGNOSIS — M65261 Calcific tendinitis, right lower leg: Secondary | ICD-10-CM

## 2024-07-04 NOTE — Patient Instructions (Signed)
 Today under ultrasound we noticed multiple calcifications in your tendons, as well as your meniscus.  We discussed multiple treatment options and today we will go with a conservative approach of knee compression, ice and periodic NSAID use.  In the future, if you are interested we could pursue a medication called colchicine  which may reduce the amount of calcifications present.  Another option would be shockwave therapy over the quad tendon to see if this helped break up calcifications.

## 2024-07-04 NOTE — Progress Notes (Addendum)
 PCP: [name], Russell GAILS, MD  Subjective:   No chief complaint on file.   HPI: Patient is a 78 y.o. male here for right knee pain.  Patient states that around 10 days ago he noticed a right knee pain around the top portion of his patella.  Usually whenever he has knee pain, he is able to decrease his activity and improve but not with his pain.  He states the pain is worse with walking and going downstairs.  He is not taking any anti-inflammatories.  He states that he has a DonJoy brace, as well as a compression sleeve which has been minimally helpful.  This pain has woken him up at night.  He denies any injuries, traumas or falls or previous histories or issues with this knee.  Patient also presents here to have an evaluation of his custom orthotics that were placed in 2020.  He states he is at increased pain on his left foot.  He also notes that he has very high arches and supinates his foot based on his previous visit.  Past Medical History:  Diagnosis Date   Anemia    on Accrufer    Anxiety    Aortic stenosis    Mild, echo, April, 2014   Blood transfusion without reported diagnosis 2023   BPH (benign prostatic hyperplasia)    CAD (coronary artery disease)    a. s/p CABG   Carotid artery disease (HCC)    Cataract 2020   bilateral sx   Cirrhosis of liver (HCC)    Colonic polyp    Diverticulosis    Elevated bilirubin    Mild chronic elevation, 2.0 January, 2011 stable   GERD (gastroesophageal reflux disease)    Barrett's esophagus-on meds   Gilbert's disease    History of kidney stones    HTN (hypertension)    on meds   Hyperlipidemia    Low HDL-on meds   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HCC)    Left  lung chest x-ray July, 2013   Persistent atrial fibrillation (HCC)    a. s/p PVI at [facility]   Precancerous lesion    Forehead   Primary osteoarthritis of left knee    Mild-bilateral LE   Prolapsed internal hemorrhoids, grade 3 08/12/2015   PVC's (premature ventricular contractions)     Seasonal allergies    Tubular adenoma of colon     Current Outpatient Medications on File Prior to Visit  Medication Sig Dispense Refill   acetaminophen  (TYLENOL ) 500 MG tablet Take 1,000 mg by mouth every 6 (six) hours as needed for moderate pain, mild pain or headache.     atorvastatin  (LIPITOR) 20 MG tablet Take 1 tablet (20 mg total) by mouth daily. 90 tablet 1   Carboxymethylcellulose Sodium (DRY EYE RELIEF OP) Place 1 drop into both eyes daily as needed (Dry eye).     Cholecalciferol 25 MCG (1000 UT) capsule Take 1,000 Units by mouth daily with supper.      ELIQUIS  5 MG TABS tablet Take 1 tablet (5 mg total) by mouth 2 (two) times daily. 60 tablet 5   furosemide  (LASIX ) 20 MG tablet Take 1 tablet once daily as needed 90 tablet 3   isosorbide  mononitrate (IMDUR ) 60 MG 24 hr tablet Take 1 tablet (60 mg total) by mouth daily. 90 tablet 3   losartan  (COZAAR ) 25 MG tablet TAKE ONE TABLET BY MOUTH DAILY 90 tablet 3   nitroGLYCERIN  (NITROSTAT ) 0.4 MG SL tablet Place 1 tablet (0.4 mg total) under the tongue  every 5 (five) minutes as needed. 25 tablet 2   NONFORMULARY OR COMPOUNDED ITEM Apply 1 application topically daily as needed (rash). Cetaphil + triamcinolone  cream     pantoprazole  (PROTONIX ) 40 MG tablet TAKE ONE TABLET BY MOUTH TWICE DAILY 180 tablet 1   No current facility-administered medications on file prior to visit.    BP 110/62   Ht 5' 11 (1.803 m)   Wt 172 lb (78 kg)   BMI 23.99 kg/m       Objective:  Physical Exam:  Gen: NAD, comfortable in exam room  Right knee Inspection: No effusion, erythema, edema or warmth Palpation: Tenderness to palpation over the quad tendon as it inserts into the superior portion of the patella ROM: Full flexion and extension Special Tests: Pain with quad extension against resistance, negative Lachman's, negative anterior posterior drawer, negative valgus and varus testing, negative McMurray's Neuro: Strength 5/5 with quad extension, quad  flexion, sensation intact bilaterally  Feet With patient standing -noted cavus foot bilaterally Left lateral column Breakdown Bilateral supination  MSK Limited knee ultrasound performed Date: 07/04/2024 Indication: Right knee pain Findings: Suprapatellar pouch visualized in long and short axis with a small effusion noted on RT not left Quadriceps tendon visualized in both long and short axis - calcifications present Patellar Tendon is visualized in long and short axis - calcifications present Medial meniscus and MCL visualized with calcifications present Lateral meniscus and LCL visualized with meniscal calcification Trochlear view: shows preserved cartilage but calcifications in cartilage and one possible loose body on RT but not left Note: joint lines and PF joint show preserved cartilage   IMPRESSION:  Right knee with mild effusion in the suprapatellar pouch, multiple calcifications noted in meniscus as well as patellar and quad tendons and PF cartilage.  U/S images and interpretation completed by Krystal Lowing, DO and Helene Haddock, MD  Images saved to PACS  Assessment & Plan:   Acute pain of right knee (Primary) Injury of quadriceps right tendon Calcific tendinitis of right knee - US  LIMITED JOINT SPACE STRUCTURES LOW RIGHT; Future - Ultrasound significant for multiple calcifications noted in tendons - Discussed possibilities with patient including colchicine , limited NSAID use and shockwave therapy versus conservative treatment with  compression sleeve for activity, icing and periodic NSAID use. -Patient would like to pursue conservative treatment at this time and may consider shockwave therapy or colchicine  in the future - Patient should continue walking as he has been - Repeat ultrasound in 4 to 6 weeks Rehab with isometric quad sets and SLR Consider trying body helix compression  Acquired bilateral cavovarus deformity - Patient's left orthotic was adjusted with increased  padding on the lateral portion to offset the cavovarus as well as supination - Patient was reevaluated once the insert was adjusted, to which he noted relief and increased comfort  Krystal Lowing, DO Sports Medicine Fellow

## 2024-07-11 ENCOUNTER — Ambulatory Visit: Admitting: Internal Medicine

## 2024-07-11 ENCOUNTER — Encounter: Payer: Self-pay | Admitting: Internal Medicine

## 2024-07-11 VITALS — BP 132/77 | HR 52 | Temp 97.4°F | Ht 71.0 in | Wt 177.0 lb

## 2024-07-11 DIAGNOSIS — Z0000 Encounter for general adult medical examination without abnormal findings: Secondary | ICD-10-CM | POA: Diagnosis not present

## 2024-07-11 DIAGNOSIS — I25810 Atherosclerosis of coronary artery bypass graft(s) without angina pectoris: Secondary | ICD-10-CM | POA: Diagnosis not present

## 2024-07-11 DIAGNOSIS — I4891 Unspecified atrial fibrillation: Secondary | ICD-10-CM | POA: Diagnosis not present

## 2024-07-11 DIAGNOSIS — K7460 Unspecified cirrhosis of liver: Secondary | ICD-10-CM | POA: Diagnosis not present

## 2024-07-11 DIAGNOSIS — R7989 Other specified abnormal findings of blood chemistry: Secondary | ICD-10-CM

## 2024-07-11 NOTE — Assessment & Plan Note (Signed)
 Continue with Eliquis 

## 2024-07-11 NOTE — Assessment & Plan Note (Signed)

## 2024-07-11 NOTE — Assessment & Plan Note (Signed)
 Due to fatty liver/liver cirrhosis diagnosed in 2024 - improved labs Lipitor dose was reduced in half due to liver cirrhosis No alcohol in 1 year Check labs

## 2024-07-11 NOTE — Assessment & Plan Note (Signed)
No chest pain.  Good exercise tolerance.  Status post CABG x5 2000 On Eliquis, Lipitor (Lipitor dose was reduced due to liver cirrhosis) Myoview March, 2011, excellent exercise, hypertensive response, no scar or ischemia, EF 61%, mild palpitations peak stress with infrequent PACs and PVCs. //  Nuclear, November, 2013, no ischemia, study was not gated.

## 2024-07-11 NOTE — Progress Notes (Signed)
 Subjective:  Patient ID: Russell Griffith, male    DOB: 06/15/1946  Age: 78 y.o. MRN: [id number]  CC: Annual Exam   HPI Eithen Castiglia presents for a well exam Follow-up on dyslipidemia, liver cirrhosis, coronary disease Overall doing well, no chest pain  Outpatient Medications Prior to Visit  Medication Sig Dispense Refill   acetaminophen  (TYLENOL ) 500 MG tablet Take 1,000 mg by mouth every 6 (six) hours as needed for moderate pain, mild pain or headache.     atorvastatin  (LIPITOR) 20 MG tablet Take 1 tablet (20 mg total) by mouth daily. 90 tablet 1   Carboxymethylcellulose Sodium (DRY EYE RELIEF OP) Place 1 drop into both eyes daily as needed (Dry eye).     Cholecalciferol 25 MCG (1000 UT) capsule Take 1,000 Units by mouth daily with supper.      ELIQUIS  5 MG TABS tablet Take 1 tablet (5 mg total) by mouth 2 (two) times daily. 60 tablet 5   furosemide  (LASIX ) 20 MG tablet Take 1 tablet once daily as needed 90 tablet 3   isosorbide  mononitrate (IMDUR ) 60 MG 24 hr tablet Take 1 tablet (60 mg total) by mouth daily. 90 tablet 3   losartan  (COZAAR ) 25 MG tablet TAKE ONE TABLET BY MOUTH DAILY 90 tablet 3   nitroGLYCERIN  (NITROSTAT ) 0.4 MG SL tablet Place 1 tablet (0.4 mg total) under the tongue every 5 (five) minutes as needed. 25 tablet 2   NONFORMULARY OR COMPOUNDED ITEM Apply 1 application topically daily as needed (rash). Cetaphil + triamcinolone  cream     pantoprazole  (PROTONIX ) 40 MG tablet TAKE ONE TABLET BY MOUTH TWICE DAILY 180 tablet 1   No facility-administered medications prior to visit.    ROS: Review of Systems  Constitutional:  Negative for appetite change, fatigue and unexpected weight change.  HENT:  Negative for congestion, nosebleeds, sneezing, sore throat and trouble swallowing.   Eyes:  Negative for itching and visual disturbance.  Respiratory:  Negative for cough.   Cardiovascular:  Negative for chest pain, palpitations and leg swelling.   Gastrointestinal:  Negative for abdominal distention, blood in stool, diarrhea and nausea.  Genitourinary:  Negative for frequency and hematuria.  Musculoskeletal:  Negative for back pain, gait problem, joint swelling and neck pain.  Skin:  Negative for rash.  Neurological:  Negative for dizziness, tremors, speech difficulty and weakness.  Psychiatric/Behavioral:  Negative for agitation, dysphoric mood and sleep disturbance. The patient is not nervous/anxious.     Objective:  BP 132/77   Pulse (!) 52   Temp (!) 97.4 F (36.3 C) (Oral)   Ht 5' 11 (1.803 m)   Wt 177 lb (80.3 kg)   SpO2 97%   BMI 24.69 kg/m   BP Readings from Last 3 Encounters:  07/11/24 132/77  07/04/24 110/62  04/28/24 (!) 112/56    Wt Readings from Last 3 Encounters:  07/11/24 177 lb (80.3 kg)  07/04/24 172 lb (78 kg)  06/29/24 179 lb (81.2 kg)    Physical Exam Constitutional:      General: He is not in acute distress.    Appearance: He is well-developed.     Comments: NAD  Eyes:     Conjunctiva/sclera: Conjunctivae normal.     Pupils: Pupils are equal, round, and reactive to light.  Neck:     Thyroid : No thyromegaly.     Vascular: No JVD.  Cardiovascular:     Rate and Rhythm: Normal rate. Rhythm irregular.     Heart sounds:  Normal heart sounds. No murmur heard.    No friction rub. No gallop.  Pulmonary:     Effort: Pulmonary effort is normal. No respiratory distress.     Breath sounds: Normal breath sounds. No wheezing or rales.  Chest:     Chest wall: No tenderness.  Abdominal:     General: Bowel sounds are normal. There is no distension.     Palpations: Abdomen is soft. There is no mass.     Tenderness: There is no abdominal tenderness. There is no guarding or rebound.  Musculoskeletal:        General: No tenderness. Normal range of motion.     Cervical back: Normal range of motion.     Right lower leg: No edema.     Left lower leg: No edema.  Lymphadenopathy:     Cervical: No  cervical adenopathy.  Skin:    General: Skin is warm and dry.     Findings: No rash.  Neurological:     Mental Status: He is alert and oriented to person, place, and time.     Cranial Nerves: No cranial nerve deficit.     Motor: No abnormal muscle tone.     Coordination: Coordination normal.     Gait: Gait normal.     Deep Tendon Reflexes: Reflexes are normal and symmetric.  Psychiatric:        Behavior: Behavior normal.        Thought Content: Thought content normal.        Judgment: Judgment normal.   Rectal - per GI  Lab Results  Component Value Date   WBC 4.8 07/12/2024   HGB 12.4 (L) 07/12/2024   HCT 37.2 (L) 07/12/2024   PLT 137.0 (L) 07/12/2024   GLUCOSE 82 07/12/2024   CHOL 87 07/12/2024   TRIG 38.0 07/12/2024   HDL 32.70 (L) 07/12/2024   LDLCALC 47 07/12/2024   ALT 16 07/12/2024   ALT 16 07/12/2024   AST 21 07/12/2024   AST 21 07/12/2024   NA 141 07/12/2024   K 4.0 07/12/2024   CL 107 07/12/2024   CREATININE 0.96 07/12/2024   BUN 22 07/12/2024   CO2 27 07/12/2024   TSH 4.20 07/12/2024   PSA 1.15 07/12/2024   INR 1.5 (H) 06/06/2022   HGBA1C 6.1 07/12/2024   MICROALBUR 14.0 (H) 07/12/2024    CUP PACEART REMOTE DEVICE CHECK Result Date: 06/09/2024 PPM Scheduled remote reviewed. Normal device function.  Presenting rhythm: VS Known AF, Eliquis  per EPIC Next remote 91 days. LA, CVRS  US  Abdomen Limited RUQ (LIVER/GB) Result Date: 06/08/2024 CLINICAL DATA:  Cirrhosis.  HCC screening. EXAM: ULTRASOUND ABDOMEN LIMITED RIGHT UPPER QUADRANT COMPARISON:  CT abdomen pelvis 12/30/2022 FINDINGS: Gallbladder: Gallstones: None Sludge: None Gallbladder Wall: Within normal limits Pericholecystic fluid: None Sonographic Murphy's Sign: Negative per technologist Common bile duct: Diameter: 2 mm Liver: Parenchymal echogenicity: Mildly coarsened Contours: Mildly nodular Lesions: None Portal vein: Patent.  Hepatopetal flow Other: Trace perihepatic ascites. IMPRESSION: Cirrhotic  liver morphology without focal hepatic lesion. Electronically Signed   By: Aliene Lloyd M.D.   On: 06/08/2024 15:38    Assessment & Plan:   Problem List Items Addressed This Visit     A-fib (HCC)   Continue with Eliquis       Abnormal LFTs   Due to fatty liver/liver cirrhosis diagnosed in 2024 - improved labs Lipitor dose was reduced in half due to liver cirrhosis No alcohol in 1 year Check labs  CAD (coronary artery disease)   No chest pain.  Good exercise tolerance.  Status post CABG x5 2000 On Eliquis , Lipitor (Lipitor dose was reduced due to liver cirrhosis) Myoview  March, 2011, excellent exercise, hypertensive response, no scar or ischemia, EF 61%, mild palpitations peak stress with infrequent PACs and PVCs. //  Nuclear, November, 2013, no ischemia, study was not gated.      Cirrhosis of liver (HCC)   New.  Follow-up with Dr. Leigh.  A fatty liver as a primary cause that has developed into cirrhosis, ascites w/400 ml fluid drawn by paracentesis... Ed lost wt, on diet, less fat, less salt in the diet, no alcohol - no abd pain over past several months.  Feeling well.  Exercising. Of note, his abdominal CT in 2023 did not show any cirrhosis of the liver.       Well adult exam - Primary    We discussed age appropriate health related issues, including available/recomended screening tests and vaccinations. Labs were ordered to be later reviewed . All questions were answered. We discussed one or more of the following - seat belt use, use of sunscreen/sun exposure exercise, fall risk reduction, second hand smoke exposure, firearm use and storage, seat belt use, a need for adhering to healthy diet and exercise. Labs were ordered.  All questions were answered.          No orders of the defined types were placed in this encounter.     Follow-up: Return in about 6 months (around 01/11/2025) for a follow-up visit.  Marolyn Noel, MD

## 2024-07-11 NOTE — Assessment & Plan Note (Signed)
New.  Follow-up with Dr. Adela Lank.  A fatty liver as a primary cause that has developed into cirrhosis, ascites w/400 ml fluid drawn by paracentesis... Ed lost wt, on diet, less fat, less salt in the diet, no alcohol - no abd pain over past several months.  Feeling well.  Exercising. Of note, his abdominal CT in 2023 did not show any cirrhosis of the liver.

## 2024-07-11 NOTE — Patient Instructions (Signed)

## 2024-07-12 ENCOUNTER — Other Ambulatory Visit

## 2024-07-12 DIAGNOSIS — R7989 Other specified abnormal findings of blood chemistry: Secondary | ICD-10-CM

## 2024-07-12 DIAGNOSIS — R739 Hyperglycemia, unspecified: Secondary | ICD-10-CM

## 2024-07-12 LAB — HEPATIC FUNCTION PANEL
ALT: 16 U/L (ref 0–53)
AST: 21 U/L (ref 0–37)
Albumin: 4.1 g/dL (ref 3.5–5.2)
Alkaline Phosphatase: 77 U/L (ref 39–117)
Bilirubin, Direct: 0.7 mg/dL — ABNORMAL HIGH (ref 0.0–0.3)
Total Bilirubin: 4.2 mg/dL — ABNORMAL HIGH (ref 0.2–1.2)
Total Protein: 6.1 g/dL (ref 6.0–8.3)

## 2024-07-12 LAB — CBC WITH DIFFERENTIAL/PLATELET
Basophils Absolute: 0 K/uL (ref 0.0–0.1)
Basophils Relative: 0.5 % (ref 0.0–3.0)
Eosinophils Absolute: 0.1 K/uL (ref 0.0–0.7)
Eosinophils Relative: 1.5 % (ref 0.0–5.0)
HCT: 37.2 % — ABNORMAL LOW (ref 39.0–52.0)
Hemoglobin: 12.4 g/dL — ABNORMAL LOW (ref 13.0–17.0)
Lymphocytes Relative: 26.4 % (ref 12.0–46.0)
Lymphs Abs: 1.3 K/uL (ref 0.7–4.0)
MCHC: 33.2 g/dL (ref 30.0–36.0)
MCV: 84.7 fl (ref 78.0–100.0)
Monocytes Absolute: 0.9 K/uL (ref 0.1–1.0)
Monocytes Relative: 19.2 % — ABNORMAL HIGH (ref 3.0–12.0)
Neutro Abs: 2.5 K/uL (ref 1.4–7.7)
Neutrophils Relative %: 52.4 % (ref 43.0–77.0)
Platelets: 137 K/uL — ABNORMAL LOW (ref 150.0–400.0)
RBC: 4.39 Mil/uL (ref 4.22–5.81)
RDW: 15.1 % (ref 11.5–15.5)
WBC: 4.8 K/uL (ref 4.0–10.5)

## 2024-07-12 LAB — COMPREHENSIVE METABOLIC PANEL WITH GFR
ALT: 16 U/L (ref 0–53)
AST: 21 U/L (ref 0–37)
Albumin: 4.1 g/dL (ref 3.5–5.2)
Alkaline Phosphatase: 77 U/L (ref 39–117)
BUN: 22 mg/dL (ref 6–23)
CO2: 27 meq/L (ref 19–32)
Calcium: 9.2 mg/dL (ref 8.4–10.5)
Chloride: 107 meq/L (ref 96–112)
Creatinine, Ser: 0.96 mg/dL (ref 0.40–1.50)
GFR: 76.79 mL/min (ref >60.00)
Glucose, Bld: 82 mg/dL (ref 70–99)
Potassium: 4 meq/L (ref 3.5–5.1)
Sodium: 141 meq/L (ref 135–145)
Total Bilirubin: 4.2 mg/dL — ABNORMAL HIGH (ref 0.2–1.2)
Total Protein: 6.1 g/dL (ref 6.0–8.3)

## 2024-07-12 LAB — LIPID PANEL
Cholesterol: 87 mg/dL (ref 0–200)
HDL: 32.7 mg/dL — ABNORMAL LOW (ref >39.00)
LDL Cholesterol: 47 mg/dL (ref 0–99)
NonHDL: 54.71
Total CHOL/HDL Ratio: 3
Triglycerides: 38 mg/dL (ref 0.0–149.0)
VLDL: 7.6 mg/dL (ref 0.0–40.0)

## 2024-07-12 LAB — TSH: TSH: 4.2 u[IU]/mL (ref 0.35–5.50)

## 2024-07-12 LAB — PSA: PSA: 1.15 ng/mL (ref 0.10–4.00)

## 2024-07-12 LAB — MICROALBUMIN / CREATININE URINE RATIO
Creatinine,U: 171.1 mg/dL
Microalb Creat Ratio: 81.6 mg/g — ABNORMAL HIGH (ref 0.0–30.0)
Microalb, Ur: 14 mg/dL — ABNORMAL HIGH (ref 0.0–1.9)

## 2024-07-12 LAB — HEMOGLOBIN A1C: Hgb A1c MFr Bld: 6.1 % (ref 4.6–6.5)

## 2024-07-14 ENCOUNTER — Encounter: Payer: Self-pay | Admitting: Internal Medicine

## 2024-07-16 ENCOUNTER — Ambulatory Visit: Payer: Self-pay | Admitting: Internal Medicine

## 2024-07-16 DIAGNOSIS — K7460 Unspecified cirrhosis of liver: Secondary | ICD-10-CM

## 2024-07-16 DIAGNOSIS — I4891 Unspecified atrial fibrillation: Secondary | ICD-10-CM

## 2024-07-16 DIAGNOSIS — R809 Proteinuria, unspecified: Secondary | ICD-10-CM

## 2024-07-16 DIAGNOSIS — Z7902 Long term (current) use of antithrombotics/antiplatelets: Secondary | ICD-10-CM

## 2024-07-25 ENCOUNTER — Ambulatory Visit: Admitting: Sports Medicine

## 2024-07-26 ENCOUNTER — Telehealth: Payer: Self-pay

## 2024-07-26 DIAGNOSIS — K760 Fatty (change of) liver, not elsewhere classified: Secondary | ICD-10-CM

## 2024-07-26 DIAGNOSIS — R188 Other ascites: Secondary | ICD-10-CM

## 2024-07-26 NOTE — Telephone Encounter (Signed)
-----   Message from [facility] sent at 02/01/2024  4:26 PM EST ----- Regarding: due for labs in Sept  CBC, LFTs, AFP in 6 months (Sept) one day next week

## 2024-07-26 NOTE — Telephone Encounter (Signed)
 Patient had recent CBC and LFTs.  Will order AFP.  MyChart message to patient to go to the lab one day next week Tues - Friday. Closed for Labor Day on Monday

## 2024-08-01 ENCOUNTER — Ambulatory Visit: Admitting: Sports Medicine

## 2024-08-02 ENCOUNTER — Other Ambulatory Visit

## 2024-08-02 DIAGNOSIS — R188 Other ascites: Secondary | ICD-10-CM | POA: Diagnosis not present

## 2024-08-02 DIAGNOSIS — K7460 Unspecified cirrhosis of liver: Secondary | ICD-10-CM | POA: Diagnosis not present

## 2024-08-02 DIAGNOSIS — K760 Fatty (change of) liver, not elsewhere classified: Secondary | ICD-10-CM

## 2024-08-02 DIAGNOSIS — R809 Proteinuria, unspecified: Secondary | ICD-10-CM

## 2024-08-02 DIAGNOSIS — Z7902 Long term (current) use of antithrombotics/antiplatelets: Secondary | ICD-10-CM

## 2024-08-04 LAB — AFP TUMOR MARKER: AFP-Tumor Marker: 2 ng/mL (ref <6.1)

## 2024-08-07 DIAGNOSIS — Z85828 Personal history of other malignant neoplasm of skin: Secondary | ICD-10-CM | POA: Diagnosis not present

## 2024-08-07 DIAGNOSIS — D485 Neoplasm of uncertain behavior of skin: Secondary | ICD-10-CM | POA: Diagnosis not present

## 2024-08-07 DIAGNOSIS — L820 Inflamed seborrheic keratosis: Secondary | ICD-10-CM | POA: Diagnosis not present

## 2024-08-07 DIAGNOSIS — L821 Other seborrheic keratosis: Secondary | ICD-10-CM | POA: Diagnosis not present

## 2024-08-07 DIAGNOSIS — D225 Melanocytic nevi of trunk: Secondary | ICD-10-CM | POA: Diagnosis not present

## 2024-08-07 DIAGNOSIS — L812 Freckles: Secondary | ICD-10-CM | POA: Diagnosis not present

## 2024-08-08 ENCOUNTER — Ambulatory Visit (INDEPENDENT_AMBULATORY_CARE_PROVIDER_SITE_OTHER): Admitting: Sports Medicine

## 2024-08-08 VITALS — BP 110/76 | Ht 71.0 in | Wt 174.0 lb

## 2024-08-08 DIAGNOSIS — M25561 Pain in right knee: Secondary | ICD-10-CM

## 2024-08-08 NOTE — Progress Notes (Signed)
 Chief complaint right knee pain  Patient was seen on August 5 with acute onset of right knee pain. At that visit he had a small effusion and some calcifications suggestive of possible CPPD reaction. His symptoms were not severe so he wanted to do conservative therapy with exercises We also prescribed compression with a body helix sleeve He continued to ice.  He states that the knee is much improved and that the motion has returned to being full Pain level is minimal and he has been able to both do the exercises and continue with e exercise regimen that he does for other activities  No swelling or locking He does occasionally get a click under his patella that seems to pop and he can straighten his leg This has not happened recently  Physical exam Pleasant older male in no acute distress BP 110/76   Ht 5' 11 (1.803 m)   Wt 174 lb (78.9 kg)   BMI 24.27 kg/m   Knee: Right Normal to inspection with no erythema or effusion or obvious bony abnormalities. Palpation normal with no warmth or joint line tenderness or patellar tenderness or condyle tenderness. ROM normal in flexion and extension and lower leg rotation. Ligaments with solid consistent endpoints including ACL, PCL, LCL, MCL. Negative Mcmurray's and provocative meniscal tests. Non painful patellar compression. Patellar and quadriceps tendons unremarkable. Hamstring and quadriceps strength is normal.  Note this is a significant improvement compared to his last exam and I do not see any sign of effusion today on physical evaluation

## 2024-08-08 NOTE — Assessment & Plan Note (Signed)
 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 The knee has excellent motion and does not show signs of significant arthritis He can see me in the future if this flares again

## 2024-08-14 ENCOUNTER — Ambulatory Visit: Payer: Self-pay | Admitting: Gastroenterology

## 2024-08-14 ENCOUNTER — Ambulatory Visit: Admitting: Gastroenterology

## 2024-08-14 ENCOUNTER — Encounter: Payer: Self-pay | Admitting: Gastroenterology

## 2024-08-14 ENCOUNTER — Other Ambulatory Visit (INDEPENDENT_AMBULATORY_CARE_PROVIDER_SITE_OTHER)

## 2024-08-14 VITALS — BP 136/54 | HR 54 | Ht 71.0 in | Wt 180.0 lb

## 2024-08-14 DIAGNOSIS — R188 Other ascites: Secondary | ICD-10-CM | POA: Diagnosis not present

## 2024-08-14 DIAGNOSIS — K219 Gastro-esophageal reflux disease without esophagitis: Secondary | ICD-10-CM

## 2024-08-14 DIAGNOSIS — K7460 Unspecified cirrhosis of liver: Secondary | ICD-10-CM

## 2024-08-14 DIAGNOSIS — Z79899 Other long term (current) drug therapy: Secondary | ICD-10-CM | POA: Diagnosis not present

## 2024-08-14 DIAGNOSIS — D649 Anemia, unspecified: Secondary | ICD-10-CM

## 2024-08-14 LAB — IBC + FERRITIN
Ferritin: 13.2 ng/mL — ABNORMAL LOW (ref 22.0–322.0)
Iron: 68 ug/dL (ref 42–165)
Saturation Ratios: 13.9 % — ABNORMAL LOW (ref 20.0–50.0)
TIBC: 488.6 ug/dL — ABNORMAL HIGH (ref 250.0–450.0)
Transferrin: 349 mg/dL (ref 212.0–360.0)

## 2024-08-14 LAB — TESTOSTERONE: Testosterone: 315.88 ng/dL (ref 300.00–890.00)

## 2024-08-14 LAB — VITAMIN B12: Vitamin B-12: 557 pg/mL (ref 211–911)

## 2024-08-14 MED ORDER — PANTOPRAZOLE SODIUM 20 MG PO TBEC: 20.0000 mg | DELAYED_RELEASE_TABLET | Freq: Every day | ORAL | Status: AC

## 2024-08-14 NOTE — Patient Instructions (Addendum)
 Please go to the lab in the basement of our building to have lab work done as you leave today. Hit B for basement when you get on the elevator.  When the doors open the lab is on your left.  We will call you with the results. Thank you.  Try to decrease your Protonix  to 20 mg once daily as tolerated.  You will be due for additional labs and an ultrasound of your liver in January.  We will reach out to you when it is time to get scheduled.  Please follow up in 6 months or sooner as needed  Thank you for entrusting me with your care and for choosing [facility], Dr. Elspeth Naval     _______________________________________________________  If your blood pressure at your visit was 140/90 or greater, please contact your primary care physician to follow up on this.  _______________________________________________________  If you are age 76 or older, your body mass index should be between 23-30. Your Body mass index is 25.1 kg/m. If this is out of the aforementioned range listed, please consider follow up with your Primary Care Provider.  If you are age 76 or younger, your body mass index should be between 19-25. Your Body mass index is 25.1 kg/m. If this is out of the aformentioned range listed, please consider follow up with your Primary Care Provider.   ________________________________________________________  The [facility] providers would like to encourage you to use MYCHART to communicate with providers for non-urgent requests or questions.  Due to long hold times on the telephone, sending your provider a message by [facility] may be a faster and more efficient way to get a response.  Please allow 48 business hours for a response.  Please remember that this is for non-urgent requests.  _______________________________________________________  [facility] is using a team-based approach to care.  Your team is made up of your doctor and two to three APPS. Our APPS  (Nurse Practitioners and Physician Assistants) work with your physician to ensure care continuity for you. They are fully qualified to address your health concerns and develop a treatment plan. They communicate directly with your gastroenterologist to care for you. Seeing the Advanced Practice Practitioners on your physician's team can help you by facilitating care more promptly, often allowing for earlier appointments, access to diagnostic testing, procedures, and other specialty referrals.

## 2024-08-14 NOTE — Progress Notes (Signed)
 HPI :  77 year old male here for follow-up for cirrhosis and GERD  Past medical history including CAD status post CABG x 5, persistent atrial fibrillation on Eliquis , sinus node dysfunction with pacemaker, aortic stenosis and history of GERD on chronic pantoprazole .  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     Cirrhosis history:  CT scan abdomen / pelvis 06/30/23: IMPRESSION: Hepatic cirrhosis and findings of portal venous hypertension. No evidence of hepatic neoplasm. New moderate ascites and diffuse mesenteric edema. Increased size of moderate right pleural effusion. Colonic diverticulosis, without radiographic evidence of diverticulitis.   This led to a paracentesis in which 400 cc were removed,, SAAG greater than 1.1, no evidence of SBP.  Cytology negative.  Total protein 4.2.  He was also referred for thoracentesis given the pleural effusion however upon attempts to drain the fluid there was no effusion noted on the ultrasound.  He subsequently underwent a serologic workup for chronic liver diseases which was negative.     SINCE LAST VISIT:   He is accompanied by his wife today.  I last saw him in January and has been doing really well since have last seen him.  At the last visit he was doing better from a volume/fluid status and they had inquired if they could expand/liberalize his diet just slightly as they are very compliant with a low-sodium diet which was really limiting their ability to eat in a restaurant or outside of the home.  Generally he remains on a low-sodium diet but has been able to go to restaurants with his wife periodically to break the monotony of their diet.  He takes Lasix  only a few days per month.  He denies any lower extremity edema.  No edema in his abdomen.  He states the ascites is apparently pretty well-controlled.  Since our last visit he had an EGD with me which showed no evidence of varices.   Additionally no Barrett's esophagus.  He has taken Protonix  in the past, chronically for years for reflux.  Currently taking 40 mg daily.  He inquires about long-term risks and options.  He is otherwise had HCC screening with an ultrasound in July which showed stable changes of his liver.  aFP has been normal.  Recall he is on Eliquis  so INR is not accurate and also has Gilbert's syndrome so his indirect bilirubin is chronically elevated.  He otherwise feels pretty well without complaints.  He inquires about the use of NSAIDs versus Tylenol  for aches and pains as he has heard conflicting views.  Most recently on labs in August his hemoglobin was slightly decreased at 12.4 with an MCV of 84.  He does endorse some increased fatigue at times.  He inquires about rechecking iron levels.    Prior workup: Fatty liver noted on imaging in 2023 US  with elastography 2021 showed Median kPa: 6.8    Cardiac PET scan 07/27/23:   Findings are consistent with mild (< 10% myocardium) apical-mid lateral ischemia.  There is associated decrease in LCX stress flow (1.15 mL/min/g). The study is intermediate risk in the setting of quantiative TID and presence of RV myocardial uptake.   LV perfusion is abnormal. Defect 1: There is a medium defect with moderate reduction in uptake present in the apical to mid anterolateral and inferolateral location(s) that is reversible. There is normal wall motion in the defect area. Consistent with ischemia.   Rest left ventricular function  is normal. Rest EF: 61%. Stress left ventricular function is normal. Stress EF: 60%. End diastolic cavity size is normal. End systolic cavity size is normal.   Myocardial blood flow was computed to be 0.66ml/g/min at rest and 1.40ml/g/min at stress. Global myocardial blood flow reserve was 2.12 and was normal.   Coronary calcium  assessment not performed due to prior revascularization. Aortic valve calcification noted. Mitral annular calcification noted.  A  dual lead CIED is noted.  Right atrial lead terminates into the right atrial appendage.  Right ventricular lead terminates in the right ventricular apex.  Contained within the SVC through its course.     EGD 10/2022: - Z-line irregular, did not meet criteria for Barrett's. - 1 cm hiatal hernia. - Normal esophagus otherwise - Two inflamed gastric polyps. Resected and retrieved as outlined. Clips were placed prophylactically. Rule out adenomas - Multiple benign fundic gland polyps - Normal stomach otherwise - Normal examined duodenum.   Surg path:  Hyperplastic polyps with no H. pylori   Colonoscopy July 2021 showed a single 1 mm polyp that was removed (tubular adenoma), moderate diverticulosis, and internal and external hemorrhoids.   Echo 11/19/23: EF 50-55%, mild AS     RUQ US  12/05/22: IMPRESSION: 1. Mildly heterogeneous hepatic echotexture. No sonographic evidence of mass lesion.   2. Gallbladder wall thickening. No stones. Negative sonographic Murphy's sign.    EGD 12/15/23: - Esophagogastric landmarks were identified: the Z-line was found at 44 cm, the gastroesophageal junction was found at 44 cm and the upper extent of the gastric folds was found at 45 cm from the incisors. Findings: - A 1 cm hiatal hernia was present. - The Z-line was irregular but did not meet criteria for Barrett's (< 1cm in length). - Benign gastric inlet patches were noted in the proximal esophagus inferior to the UES. The exam of the esophagus was otherwise normal. No varices. - Multiple small sessile polyps were found in the gastric body. He previously has had benign hyperplastic polyps removed, no biopsies taken. - Retained endoclips (4) were found in the gastric body, related to prior polypectomies in 2023. - The exam of the stomach was otherwise normal. No varices. - The examined duodenum was normal.  Repeat EGD in 2-3 years   RUQ US  06/08/24: IMPRESSION: Cirrhotic liver morphology without focal hepatic  lesion.    Past Medical History:  Diagnosis Date   A-fib (HCC)    Anemia    on Accrufer    Anxiety    Aortic stenosis    Mild, echo, April, 2014   Blood transfusion without reported diagnosis 2023   BPH (benign prostatic hyperplasia)    CAD (coronary artery disease)    a. s/p CABG   Carotid artery disease (HCC)    Cataract 2020   bilateral sx   Cirrhosis of liver (HCC)    Colonic polyp    Diverticulosis    Elevated bilirubin    Mild chronic elevation, 2.0 January, 2011 stable   GERD (gastroesophageal reflux disease)    Barrett's esophagus-on meds   Gilbert's disease    History of kidney stones    HTN (hypertension)    on meds   Hyperlipidemia    Low HDL-on meds   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HCC)    Left  lung chest x-ray July, 2013   Persistent atrial fibrillation (HCC)    a. s/p PVI at [facility]   Precancerous lesion    Forehead   Primary osteoarthritis of left knee    Mild-bilateral  LE   Prolapsed internal hemorrhoids, grade 3 08/12/2015   PVC's (premature ventricular contractions)    Seasonal allergies    Tubular adenoma of colon      Past Surgical History:  Procedure Laterality Date   ABLATION     PVI at [facility]   ATRIAL FIBRILLATION ABLATION N/A 01/25/2019   Procedure: ATRIAL FIBRILLATION ABLATION;  Surgeon: Kelsie Agent, MD;  Location: [facility] INVASIVE CV LAB;  Service: Cardiovascular;  Laterality: N/A;   CARDIOVERSION N/A 10/14/2018   Procedure: CARDIOVERSION;  Surgeon: Okey Vina GAILS, MD;  Location: [facility] ENDOSCOPY;  Service: Cardiovascular;  Laterality: N/A;   CARDIOVERSION N/A 08/18/2019   Procedure: CARDIOVERSION;  Surgeon: Jeffrie Oneil BROCKS, MD;  Location: [facility] ENDOSCOPY;  Service: Cardiovascular;  Laterality: N/A;   COLONOSCOPY     CORONARY ARTERY BYPASS GRAFT  2000   CABG X5   CORONARY PRESSURE/FFR STUDY N/A 01/05/2018   Procedure: INTRAVASCULAR PRESSURE WIRE/FFR STUDY;  Surgeon: Wonda Sharper, MD;  Location: [facility] INVASIVE CV LAB;  Service: Cardiovascular;  Laterality: N/A;    CORONARY STENT INTERVENTION N/A 05/22/2022   Procedure: CORONARY STENT INTERVENTION;  Surgeon: Dann Candyce RAMAN, MD;  Location: [facility] INVASIVE CV LAB;  Service: Cardiovascular;  Laterality: N/A;   CORONARY ULTRASOUND/IVUS N/A 05/22/2022   Procedure: Intravascular Ultrasound/IVUS;  Surgeon: Dann Candyce RAMAN, MD;  Location: [facility] INVASIVE CV LAB;  Service: Cardiovascular;  Laterality: N/A;   CYSTOSCOPY WITH RETROGRADE PYELOGRAM, URETEROSCOPY AND STENT PLACEMENT Bilateral 09/04/2020   Procedure: CYSTOSCOPY WITH BILATERAL RETROGRADE PYELOGRAM, RIGHT URETEROSCOPY AND RIGHT  STENT PLACEMENT;  Surgeon: Matilda Senior, MD;  Location: [facility] ORS;  Service: Urology;  Laterality: Bilateral;   ENTEROSCOPY N/A 06/06/2022   Procedure: ENTEROSCOPY;  Surgeon: Leigh Elspeth SQUIBB, MD;  Location: [facility] ENDOSCOPY;  Service: Gastroenterology;  Laterality: N/A;   ESOPHAGOGASTRODUODENOSCOPY     HEAD & NECK SKIN LESION EXCISIONAL BIOPSY     HEMORRHOID BANDING     HEMOSTASIS CLIP PLACEMENT  06/06/2022   Procedure: HEMOSTASIS CLIP PLACEMENT;  Surgeon: Leigh Elspeth SQUIBB, MD;  Location: [facility] ENDOSCOPY;  Service: Gastroenterology;;   HOT HEMOSTASIS N/A 06/06/2022   Procedure: HOT HEMOSTASIS (ARGON PLASMA COAGULATION/BICAP);  Surgeon: Leigh Elspeth SQUIBB, MD;  Location: [facility] ENDOSCOPY;  Service: Gastroenterology;  Laterality: N/A;   IR PARACENTESIS  07/07/2023   LEFT HEART CATH AND CORS/GRAFTS ANGIOGRAPHY N/A 01/05/2018   Procedure: LEFT HEART CATH AND CORS/GRAFTS ANGIOGRAPHY;  Surgeon: Wonda Sharper, MD;  Location: [facility] INVASIVE CV LAB;  Service: Cardiovascular;  Laterality: N/A;   PERMANENT PACEMAKER INSERTION N/A 07/15/2012   Procedure: PERMANENT PACEMAKER INSERTION;  Surgeon: Elspeth BROCKS Sage, MD;  Location: [facility] CATH LAB;  Service: Cardiovascular;  Laterality: N/A;   PPM GENERATOR CHANGEOUT N/A 09/09/2022   Procedure: PPM GENERATOR CHANGEOUT;  Surgeon: Sage Elspeth BROCKS, MD;  Location: [facility] INVASIVE CV LAB;  Service: Cardiovascular;   Laterality: N/A;   RIGHT/LEFT HEART CATH AND CORONARY/GRAFT ANGIOGRAPHY N/A 05/22/2022   Procedure: RIGHT/LEFT HEART CATH AND CORONARY/GRAFT ANGIOGRAPHY;  Surgeon: Dann Candyce RAMAN, MD;  Location: [facility] INVASIVE CV LAB;  Service: Cardiovascular;  Laterality: N/A;   rotator cuff surgery Right 2017   SCLEROTHERAPY  06/06/2022   Procedure: SCLEROTHERAPY;  Surgeon: Leigh Elspeth SQUIBB, MD;  Location: [facility] ENDOSCOPY;  Service: Gastroenterology;;   TONSILLECTOMY  1956   WISDOM TOOTH EXTRACTION     Family History  Problem Relation Age of Onset   Multiple myeloma Mother    Diabetes Father    Renal Disease Father    Coronary artery disease Other 11  Diabetes Other    Hyperlipidemia Other    Hypertension Other    Colon cancer Neg Hx    Esophageal cancer Neg Hx    Stomach cancer Neg Hx    Rectal cancer Neg Hx    Colon polyps Neg Hx    Social History   Tobacco Use   Smoking status: Never   Smokeless tobacco: Never  Vaping Use   Vaping status: Never Used  Substance Use Topics   Alcohol use: Not Currently   Drug use: No   Current Outpatient Medications  Medication Sig Dispense Refill   acetaminophen  (TYLENOL ) 500 MG tablet Take 1,000 mg by mouth every 6 (six) hours as needed for moderate pain, mild pain or headache.     atorvastatin  (LIPITOR) 20 MG tablet Take 1 tablet (20 mg total) by mouth daily. 90 tablet 1   Carboxymethylcellulose Sodium (DRY EYE RELIEF OP) Place 1 drop into both eyes daily as needed (Dry eye).     Cholecalciferol 25 MCG (1000 UT) capsule Take 1,000 Units by mouth daily with supper.      ELIQUIS  5 MG TABS tablet Take 1 tablet (5 mg total) by mouth 2 (two) times daily. 60 tablet 5   furosemide  (LASIX ) 20 MG tablet Take 1 tablet once daily as needed 90 tablet 3   isosorbide  mononitrate (IMDUR ) 60 MG 24 hr tablet Take 1 tablet (60 mg total) by mouth daily. 90 tablet 3   losartan  (COZAAR ) 25 MG tablet TAKE ONE TABLET BY MOUTH DAILY 90 tablet 3   mometasone (ELOCON) 0.1 %  cream Apply 1 Application topically daily.     nitroGLYCERIN  (NITROSTAT ) 0.4 MG SL tablet Place 1 tablet (0.4 mg total) under the tongue every 5 (five) minutes as needed. 25 tablet 2   NONFORMULARY OR COMPOUNDED ITEM Apply 1 application topically daily as needed (rash). Cetaphil + triamcinolone  cream     pantoprazole  (PROTONIX ) 40 MG tablet TAKE ONE TABLET BY MOUTH TWICE DAILY 180 tablet 1   No current facility-administered medications for this visit.   Allergies  Allergen Reactions   Cayenne Other (See Comments)    Sweats w/paprika too   Niacin And Related Other (See Comments)    Upset stomach   Spironolactone      Cramps, dizziness   Sulfa Antibiotics Itching and Rash    have no idea; mother told me I was allergic to    Sulfonamide Derivatives Other (See Comments)    have no idea; mother told me I was allergic to     Review of Systems: All systems reviewed and negative except where noted in HPI.   Lab Results  Component Value Date   WBC 4.8 07/12/2024   HGB 12.4 (L) 07/12/2024   HCT 37.2 (L) 07/12/2024   MCV 84.7 07/12/2024   PLT 137.0 (L) 07/12/2024    Lab Results  Component Value Date   ALT 16 07/12/2024   ALT 16 07/12/2024   AST 21 07/12/2024   AST 21 07/12/2024   ALKPHOS 77 07/12/2024   ALKPHOS 77 07/12/2024   BILITOT 4.2 (H) 07/12/2024   BILITOT 4.2 (H) 07/12/2024    Lab Results  Component Value Date   NA 141 07/12/2024   CL 107 07/12/2024   K 4.0 07/12/2024   CO2 27 07/12/2024   BUN 22 07/12/2024   CREATININE 0.96 07/12/2024   GFR 76.79 07/12/2024   CALCIUM  9.2 07/12/2024   ALBUMIN 4.1 07/12/2024   ALBUMIN 4.1 07/12/2024   GLUCOSE 82 07/12/2024  Physical Exam: BP (!) 136/54 (BP Location: Left Arm, Patient Position: Sitting, Cuff Size: Normal)   Pulse (!) 54   Ht 5' 11 (1.803 m)   Wt 180 lb (81.6 kg)   BMI 25.10 kg/m  Constitutional: Pleasant,well-developed, male in no acute distress. Neurological: Alert and oriented to person place  and time. Psychiatric: Normal mood and affect. Behavior is normal.   ASSESSMENT: 78 y.o. male here for assessment of the following  1. Cirrhosis of liver with ascites, unspecified hepatic cirrhosis type (HCC)   2. Gilbert's syndrome   3. Anemia, unspecified type   4. Gastroesophageal reflux disease, unspecified whether esophagitis present   5. 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  History of ascites that has since resolved with low-sodium diet and as needed diuretics.  This is doing much better since of last seen him and he has been able to liberalize his diet slightly well generally remaining on a low-sodium diet and doing really well.  We discussed cirrhosis, risks for decompensation and worsening as well as HCC.  Hard to follow his bilirubin and INR as both are artificially elevated (gilbert's and Eliquis ).  For now we will plan on repeating ultrasound in January, and labs at that time as well as a few FP.  EGD showed no varices.  He will see me every 6 months moving forward.  In light of his cirrhosis recommend against using routine NSAIDs and would prefer him to use low-dose Tylenol  as needed for aches and pains, counseled him that it is safe for him with his liver disease.  Otherwise, he has a very mild anemia, normochromic, recently.  He has some fatigue as well.  Will check iron studies to make sure normal as well as B12 and a testosterone  level.  He agrees with this.  Discussed history of GERD, management of this depends on how much his reflux bothers him.  His reflux has historically been well-controlled on Protonix , although he has had some breakthrough when trying to to come off it in the past.  We discussed long-term risks of chronic PPIs, recommend using the lowest dose possible to control his symptoms.  He will initially try reducing to 20 mg daily, will cut his 40 mg tab in half and see how that does.  He can  increase as needed for breakthrough.   PLAN: - compensated currently, continue low Na diet, lasix  PRN - RUQ US  and labs in January - lab today - TIBC / ferritin, B12, and testosterone  level - discussed long term use of PPIs and risks - taper protonix  to 20mg  / day as tolerated - avoid NSAIDs, use tylenol  lower dose PRN aches / pains - flu shot recommended - f/u 6 months or sooner with issues  I spent 30 minutes of time, including in depth chart review, face-to-face time with the patient,  and documentation.  Marcey Naval, MD [facility]

## 2024-08-25 ENCOUNTER — Other Ambulatory Visit (HOSPITAL_BASED_OUTPATIENT_CLINIC_OR_DEPARTMENT_OTHER): Payer: Self-pay

## 2024-08-25 MED ORDER — COMIRNATY 30 MCG/0.3ML IM SUSY
0.3000 mL | PREFILLED_SYRINGE | Freq: Once | INTRAMUSCULAR | 0 refills | Status: AC
  Filled 2024-08-25: qty 0.3, 1d supply, fill #0

## 2024-08-28 NOTE — Telephone Encounter (Signed)
 Patient has been scheduled for 10/30 for colonoscopy and 10/15 for pre visit. Please advise, thank you

## 2024-08-28 NOTE — Telephone Encounter (Signed)
 Noted

## 2024-09-03 ENCOUNTER — Encounter: Payer: Self-pay | Admitting: Gastroenterology

## 2024-09-03 DIAGNOSIS — K7460 Unspecified cirrhosis of liver: Secondary | ICD-10-CM

## 2024-09-06 ENCOUNTER — Other Ambulatory Visit (INDEPENDENT_AMBULATORY_CARE_PROVIDER_SITE_OTHER)

## 2024-09-06 ENCOUNTER — Ambulatory Visit (HOSPITAL_BASED_OUTPATIENT_CLINIC_OR_DEPARTMENT_OTHER)
Admission: RE | Admit: 2024-09-06 | Discharge: 2024-09-06 | Disposition: A | Discharge status: Home / Self Care | Source: Ambulatory Visit | Attending: Physician Assistant | Admitting: Physician Assistant

## 2024-09-06 ENCOUNTER — Ambulatory Visit: Payer: Self-pay | Admitting: Physician Assistant

## 2024-09-06 DIAGNOSIS — R188 Other ascites: Secondary | ICD-10-CM | POA: Diagnosis not present

## 2024-09-06 DIAGNOSIS — K7460 Unspecified cirrhosis of liver: Secondary | ICD-10-CM | POA: Insufficient documentation

## 2024-09-06 DIAGNOSIS — K7689 Other specified diseases of liver: Secondary | ICD-10-CM | POA: Diagnosis not present

## 2024-09-06 LAB — COMPREHENSIVE METABOLIC PANEL WITH GFR
ALT: 17 U/L (ref 0–53)
AST: 22 U/L (ref 0–37)
Albumin: 4.2 g/dL (ref 3.5–5.2)
Alkaline Phosphatase: 81 U/L (ref 39–117)
BUN: 20 mg/dL (ref 6–23)
CO2: 26 meq/L (ref 19–32)
Calcium: 9.1 mg/dL (ref 8.4–10.5)
Chloride: 107 meq/L (ref 96–112)
Creatinine, Ser: 1.06 mg/dL (ref 0.40–1.50)
GFR: 68.1 mL/min (ref >60.00)
Glucose, Bld: 93 mg/dL (ref 70–99)
Potassium: 4.2 meq/L (ref 3.5–5.1)
Sodium: 141 meq/L (ref 135–145)
Total Bilirubin: 3.6 mg/dL — ABNORMAL HIGH (ref 0.2–1.2)
Total Protein: 6.5 g/dL (ref 6.0–8.3)

## 2024-09-06 LAB — CBC WITH DIFFERENTIAL/PLATELET
Basophils Absolute: 0 K/uL (ref 0.0–0.1)
Basophils Relative: 0.8 % (ref 0.0–3.0)
Eosinophils Absolute: 0.1 K/uL (ref 0.0–0.7)
Eosinophils Relative: 2.2 % (ref 0.0–5.0)
HCT: 37 % — ABNORMAL LOW (ref 39.0–52.0)
Hemoglobin: 12.2 g/dL — ABNORMAL LOW (ref 13.0–17.0)
Lymphocytes Relative: 27.7 % (ref 12.0–46.0)
Lymphs Abs: 1.2 K/uL (ref 0.7–4.0)
MCHC: 33.1 g/dL (ref 30.0–36.0)
MCV: 84.8 fl (ref 78.0–100.0)
Monocytes Absolute: 0.8 K/uL (ref 0.1–1.0)
Monocytes Relative: 17.5 % — ABNORMAL HIGH (ref 3.0–12.0)
Neutro Abs: 2.3 K/uL (ref 1.4–7.7)
Neutrophils Relative %: 51.8 % (ref 43.0–77.0)
Platelets: 144 K/uL — ABNORMAL LOW (ref 150.0–400.0)
RBC: 4.36 Mil/uL (ref 4.22–5.81)
RDW: 15.2 % (ref 11.5–15.5)
WBC: 4.5 K/uL (ref 4.0–10.5)

## 2024-09-06 LAB — PROTIME-INR
INR: 2.6 ratio — ABNORMAL HIGH (ref 0.8–1.0)
Prothrombin Time: 26.8 s — ABNORMAL HIGH (ref 9.6–13.1)

## 2024-09-06 NOTE — Progress Notes (Signed)
 Please notify patient his abdominal ultrasound shows: 1.  Trace ascites around the liver.  There is no significant ascites in the other abdomen.  He does not meet criteria for paracentesis. 2.  Slightly nodular contour of the liver, consistent with known cirrhosis. 3.  A sludge ball versus gallstone in the gallbladder.  Does not appear to be symptomatic.  If he starts having RUQ pain, nausea, vomiting, please let us  know. Ellouise Console, PA-C  CC: Dr. Leigh

## 2024-09-06 NOTE — Telephone Encounter (Signed)
 Discussed ultrasound results with patient who voiced  understanding. Denies questions or concerns.

## 2024-09-07 ENCOUNTER — Ambulatory Visit: Payer: PPO

## 2024-09-07 DIAGNOSIS — I495 Sick sinus syndrome: Secondary | ICD-10-CM | POA: Diagnosis not present

## 2024-09-08 ENCOUNTER — Ambulatory Visit: Payer: Self-pay | Admitting: Cardiology

## 2024-09-08 LAB — CUP PACEART REMOTE DEVICE CHECK
Battery Remaining Longevity: 161 mo
Battery Voltage: 3.06 V
Brady Statistic AP VP Percent: 0 %
Brady Statistic AP VS Percent: 0 %
Brady Statistic AS VP Percent: 5.67 %
Brady Statistic AS VS Percent: 94.33 %
Brady Statistic RA Percent Paced: 0 %
Brady Statistic RV Percent Paced: 5.67 %
Date Time Interrogation Session: 20251009044930
Implantable Lead Connection Status: 753985
Implantable Lead Connection Status: 753985
Implantable Lead Implant Date: 20130816
Implantable Lead Implant Date: 20130816
Implantable Lead Location: 753859
Implantable Lead Location: 753860
Implantable Lead Model: 5076
Implantable Lead Model: 5076
Implantable Pulse Generator Implant Date: 20231011
Lead Channel Impedance Value: 323 Ohm
Lead Channel Impedance Value: 475 Ohm
Lead Channel Impedance Value: 722 Ohm
Lead Channel Impedance Value: 931 Ohm
Lead Channel Pacing Threshold Amplitude: 2.5 V
Lead Channel Pacing Threshold Pulse Width: 0.4 ms
Lead Channel Sensing Intrinsic Amplitude: 0.375 mV
Lead Channel Sensing Intrinsic Amplitude: 14.125 mV
Lead Channel Sensing Intrinsic Amplitude: 14.125 mV
Lead Channel Setting Pacing Amplitude: 2.5 V
Lead Channel Setting Pacing Pulse Width: 1 ms
Lead Channel Setting Sensing Sensitivity: 0.9 mV
Zone Setting Status: 755011

## 2024-09-08 NOTE — Progress Notes (Signed)
 Remote PPM Transmission

## 2024-09-12 NOTE — Progress Notes (Signed)
 Remote PPM Transmission

## 2024-09-13 ENCOUNTER — Encounter: Payer: Self-pay | Admitting: Gastroenterology

## 2024-09-13 ENCOUNTER — Other Ambulatory Visit: Payer: Self-pay

## 2024-09-13 ENCOUNTER — Ambulatory Visit (AMBULATORY_SURGERY_CENTER)

## 2024-09-13 VITALS — Ht 71.0 in | Wt 177.0 lb

## 2024-09-13 DIAGNOSIS — Z860100 Personal history of colon polyps, unspecified: Secondary | ICD-10-CM

## 2024-09-13 MED ORDER — NA SULFATE-K SULFATE-MG SULF 17.5-3.13-1.6 GM/177ML PO SOLN: 1.0000 | Freq: Once | ORAL | 0 refills | Status: AC

## 2024-09-13 NOTE — Progress Notes (Signed)
 Denies allergies to eggs or soy products. Denies complication of anesthesia or sedation. Denies use of weight loss medication. Denies use of O2.   Emmi instructions given for colonoscopy.

## 2024-09-15 ENCOUNTER — Other Ambulatory Visit (HOSPITAL_BASED_OUTPATIENT_CLINIC_OR_DEPARTMENT_OTHER): Payer: Self-pay

## 2024-09-15 MED ORDER — FLUZONE HIGH-DOSE 0.5 ML IM SUSY
0.5000 mL | PREFILLED_SYRINGE | Freq: Once | INTRAMUSCULAR | 0 refills | Status: AC
  Filled 2024-09-15: qty 0.5, 1d supply, fill #0

## 2024-09-28 ENCOUNTER — Encounter: Payer: Self-pay | Admitting: Gastroenterology

## 2024-09-28 ENCOUNTER — Ambulatory Visit: Admitting: Gastroenterology

## 2024-09-28 VITALS — BP 114/54 | HR 63 | Temp 97.7°F | Resp 17 | Ht 71.0 in | Wt 177.0 lb

## 2024-09-28 DIAGNOSIS — K5730 Diverticulosis of large intestine without perforation or abscess without bleeding: Secondary | ICD-10-CM

## 2024-09-28 DIAGNOSIS — F419 Anxiety disorder, unspecified: Secondary | ICD-10-CM | POA: Diagnosis not present

## 2024-09-28 DIAGNOSIS — D120 Benign neoplasm of cecum: Secondary | ICD-10-CM

## 2024-09-28 DIAGNOSIS — D122 Benign neoplasm of ascending colon: Secondary | ICD-10-CM

## 2024-09-28 DIAGNOSIS — I10 Essential (primary) hypertension: Secondary | ICD-10-CM | POA: Diagnosis not present

## 2024-09-28 DIAGNOSIS — Z860101 Personal history of adenomatous and serrated colon polyps: Secondary | ICD-10-CM | POA: Diagnosis not present

## 2024-09-28 DIAGNOSIS — Z860100 Personal history of colon polyps, unspecified: Secondary | ICD-10-CM

## 2024-09-28 DIAGNOSIS — I2510 Atherosclerotic heart disease of native coronary artery without angina pectoris: Secondary | ICD-10-CM | POA: Diagnosis not present

## 2024-09-28 DIAGNOSIS — K648 Other hemorrhoids: Secondary | ICD-10-CM | POA: Diagnosis not present

## 2024-09-28 DIAGNOSIS — D123 Benign neoplasm of transverse colon: Secondary | ICD-10-CM

## 2024-09-28 DIAGNOSIS — I4891 Unspecified atrial fibrillation: Secondary | ICD-10-CM | POA: Diagnosis not present

## 2024-09-28 DIAGNOSIS — D509 Iron deficiency anemia, unspecified: Secondary | ICD-10-CM | POA: Diagnosis not present

## 2024-09-28 MED ORDER — SODIUM CHLORIDE 0.9 % IV SOLN: 500.0000 mL | Freq: Once | INTRAVENOUS | Status: DC

## 2024-09-28 NOTE — Progress Notes (Signed)
 Samples of Fodzyme given to pt per dr leigh

## 2024-09-28 NOTE — Progress Notes (Signed)
 [facility] History and Physical   Primary Care Physician:  [name], Karlynn GAILS, MD   Reason for Procedure:   Iron deficiency  Plan:    colonoscopy     HPI: Param Capri is a 78 y.o. male  here for colonoscopy - recently dx IDA. EGD earlier this year without cause. Last colonoscopy 05/2020 - one diminutive adenoma. Colonoscopy for further evaluation.   Patient denies any bowel symptoms at this time. No family history of colon cancer known. Otherwise feels well without any cardiopulmonary symptoms.   I have discussed risks / benefits of anesthesia and endoscopic procedure with Norleen Dallas Massing and they wish to proceed with the exams as outlined today.    Past Medical History:  Diagnosis Date   A-fib [facility])    Allergy    Anemia    on Accrufer    Anxiety    Aortic stenosis    Mild, echo, April, 2014   Blood transfusion without reported diagnosis 2023   BPH (benign prostatic hyperplasia)    CAD (coronary artery disease)    a. s/p CABG   Carotid artery disease    Cataract 2020   bilateral sx   CHF (congestive heart failure) (HCC)    Cirrhosis of liver (HCC)    Clotting disorder    Colonic polyp    Depression    Diverticulosis    Elevated bilirubin    Mild chronic elevation, 2.0 January, 2011 stable   GERD (gastroesophageal reflux disease)    Barrett's esophagus-on meds   Gilbert's disease    Heart murmur    History of kidney stones    HTN (hypertension)    on meds   Hyperlipidemia    Low HDL-on meds   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HCC)    Left  lung chest x-ray July, 2013   Persistent atrial fibrillation (HCC)    a. s/p PVI at [facility]   Precancerous lesion    Forehead   Primary osteoarthritis of left knee    Mild-bilateral LE   Prolapsed internal hemorrhoids, grade 3 08/12/2015   PVC's (premature ventricular contractions)    Seasonal allergies    Tubular adenoma of colon     Past Surgical History:  Procedure Laterality Date   ABLATION     PVI at  [facility]   ATRIAL FIBRILLATION ABLATION N/A 01/25/2019   Procedure: ATRIAL FIBRILLATION ABLATION;  Surgeon: Kelsie Agent, MD;  Location: [facility] INVASIVE CV LAB;  Service: Cardiovascular;  Laterality: N/A;   CARDIOVERSION N/A 10/14/2018   Procedure: CARDIOVERSION;  Surgeon: Okey Vina GAILS, MD;  Location: [facility] ENDOSCOPY;  Service: Cardiovascular;  Laterality: N/A;   CARDIOVERSION N/A 08/18/2019   Procedure: CARDIOVERSION;  Surgeon: Jeffrie Oneil BROCKS, MD;  Location: [facility] ENDOSCOPY;  Service: Cardiovascular;  Laterality: N/A;   COLONOSCOPY     CORONARY ARTERY BYPASS GRAFT  2000   CABG X5   CORONARY PRESSURE/FFR STUDY N/A 01/05/2018   Procedure: INTRAVASCULAR PRESSURE WIRE/FFR STUDY;  Surgeon: Wonda Sharper, MD;  Location: [facility] INVASIVE CV LAB;  Service: Cardiovascular;  Laterality: N/A;   CORONARY STENT INTERVENTION N/A 05/22/2022   Procedure: CORONARY STENT INTERVENTION;  Surgeon: Dann Candyce RAMAN, MD;  Location: [facility] INVASIVE CV LAB;  Service: Cardiovascular;  Laterality: N/A;   CORONARY ULTRASOUND/IVUS N/A 05/22/2022   Procedure: Intravascular Ultrasound/IVUS;  Surgeon: Dann Candyce RAMAN, MD;  Location: [facility] INVASIVE CV LAB;  Service: Cardiovascular;  Laterality: N/A;   CYSTOSCOPY WITH RETROGRADE PYELOGRAM, URETEROSCOPY AND STENT PLACEMENT Bilateral 09/04/2020   Procedure: CYSTOSCOPY WITH BILATERAL RETROGRADE  PYELOGRAM, RIGHT URETEROSCOPY AND RIGHT  STENT PLACEMENT;  Surgeon: Matilda Senior, MD;  Location: [facility] ORS;  Service: Urology;  Laterality: Bilateral;   ENTEROSCOPY N/A 06/06/2022   Procedure: ENTEROSCOPY;  Surgeon: Leigh Elspeth SQUIBB, MD;  Location: [facility] ENDOSCOPY;  Service: Gastroenterology;  Laterality: N/A;   ESOPHAGOGASTRODUODENOSCOPY     HEAD & NECK SKIN LESION EXCISIONAL BIOPSY     HEMORRHOID BANDING     HEMOSTASIS CLIP PLACEMENT  06/06/2022   Procedure: HEMOSTASIS CLIP PLACEMENT;  Surgeon: Leigh Elspeth SQUIBB, MD;  Location: [facility] ENDOSCOPY;  Service: Gastroenterology;;   HOT HEMOSTASIS N/A 06/06/2022    Procedure: HOT HEMOSTASIS (ARGON PLASMA COAGULATION/BICAP);  Surgeon: Leigh Elspeth SQUIBB, MD;  Location: [facility];  Service: Gastroenterology;  Laterality: N/A;   IR PARACENTESIS  07/07/2023   LEFT HEART CATH AND CORS/GRAFTS ANGIOGRAPHY N/A 01/05/2018   Procedure: LEFT HEART CATH AND CORS/GRAFTS ANGIOGRAPHY;  Surgeon: Wonda Sharper, MD;  Location: [facility] INVASIVE CV LAB;  Service: Cardiovascular;  Laterality: N/A;   PERMANENT PACEMAKER INSERTION N/A 07/15/2012   Procedure: PERMANENT PACEMAKER INSERTION;  Surgeon: Elspeth JAYSON Sage, MD;  Location: [facility] CATH LAB;  Service: Cardiovascular;  Laterality: N/A;   PPM GENERATOR CHANGEOUT N/A 09/09/2022   Procedure: PPM GENERATOR CHANGEOUT;  Surgeon: Sage Elspeth JAYSON, MD;  Location: [facility] INVASIVE CV LAB;  Service: Cardiovascular;  Laterality: N/A;   RIGHT/LEFT HEART CATH AND CORONARY/GRAFT ANGIOGRAPHY N/A 05/22/2022   Procedure: RIGHT/LEFT HEART CATH AND CORONARY/GRAFT ANGIOGRAPHY;  Surgeon: Dann Candyce RAMAN, MD;  Location: [facility] INVASIVE CV LAB;  Service: Cardiovascular;  Laterality: N/A;   rotator cuff surgery Right 2017   SCLEROTHERAPY  06/06/2022   Procedure: SCLEROTHERAPY;  Surgeon: Leigh Elspeth SQUIBB, MD;  Location: [facility] ENDOSCOPY;  Service: Gastroenterology;;   TONSILLECTOMY  1956   WISDOM TOOTH EXTRACTION      Prior to Admission medications   Medication Sig Start Date End Date Taking? Authorizing Provider  atorvastatin  (LIPITOR) 20 MG tablet Take 1 tablet (20 mg total) by mouth daily. 05/29/24  Yes [name], Aleksei V, MD  Cholecalciferol 25 MCG (1000 UT) capsule Take 1,000 Units by mouth daily with supper.    Yes [provider]  isosorbide  mononitrate (IMDUR ) 60 MG 24 hr tablet Take 1 tablet (60 mg total) by mouth daily. 05/09/24  Yes Lavona Agent, MD  losartan  (COZAAR ) 25 MG tablet TAKE ONE TABLET BY MOUTH DAILY 03/27/24  Yes [name], Aleksei V, MD  pantoprazole  (PROTONIX ) 20 MG tablet Take 1 tablet (20 mg total) by mouth daily. 08/14/24   Yes Mumtaz Lovins, Elspeth SQUIBB, MD  acetaminophen  (TYLENOL ) 500 MG tablet Take 1,000 mg by mouth every 6 (six) hours as needed for moderate pain, mild pain or headache.    [provider]  Carboxymethylcellulose Sodium (DRY EYE RELIEF OP) Place 1 drop into both eyes daily as needed (Dry eye).    [provider]  ELIQUIS  5 MG TABS tablet Take 1 tablet (5 mg total) by mouth 2 (two) times daily. 04/25/24   Lavona Agent, MD  furosemide  (LASIX ) 20 MG tablet Take 1 tablet once daily as needed 08/20/23   Jamarria Real, Elspeth SQUIBB, MD  mometasone (ELOCON) 0.1 % cream Apply 1 Application topically daily. 08/07/24   [provider]  nitroGLYCERIN  (NITROSTAT ) 0.4 MG SL tablet Place 1 tablet (0.4 mg total) under the tongue every 5 (five) minutes as needed. Patient not taking: Reported on 09/13/2024 05/22/22   Henry Shaver B, NP  NONFORMULARY OR COMPOUNDED ITEM Apply 1 application topically daily as needed (rash). Cetaphil +  triamcinolone  cream    [provider]    Current Outpatient Medications  Medication Sig Dispense Refill   atorvastatin  (LIPITOR) 20 MG tablet Take 1 tablet (20 mg total) by mouth daily. 90 tablet 1   Cholecalciferol 25 MCG (1000 UT) capsule Take 1,000 Units by mouth daily with supper.      isosorbide  mononitrate (IMDUR ) 60 MG 24 hr tablet Take 1 tablet (60 mg total) by mouth daily. 90 tablet 3   losartan  (COZAAR ) 25 MG tablet TAKE ONE TABLET BY MOUTH DAILY 90 tablet 3   pantoprazole  (PROTONIX ) 20 MG tablet Take 1 tablet (20 mg total) by mouth daily.     acetaminophen  (TYLENOL ) 500 MG tablet Take 1,000 mg by mouth every 6 (six) hours as needed for moderate pain, mild pain or headache.     Carboxymethylcellulose Sodium (DRY EYE RELIEF OP) Place 1 drop into both eyes daily as needed (Dry eye).     ELIQUIS  5 MG TABS tablet Take 1 tablet (5 mg total) by mouth 2 (two) times daily. 60 tablet 5   furosemide  (LASIX ) 20 MG tablet Take 1 tablet once daily as needed 90  tablet 3   mometasone (ELOCON) 0.1 % cream Apply 1 Application topically daily.     nitroGLYCERIN  (NITROSTAT ) 0.4 MG SL tablet Place 1 tablet (0.4 mg total) under the tongue every 5 (five) minutes as needed. (Patient not taking: Reported on 09/13/2024) 25 tablet 2   NONFORMULARY OR COMPOUNDED ITEM Apply 1 application topically daily as needed (rash). Cetaphil + triamcinolone  cream     Current Facility-Administered Medications  Medication Dose Route Frequency Provider Last Rate Last Admin   0.9 %  sodium chloride  infusion  500 mL Intravenous Once Ashdon Gillson, Elspeth SQUIBB, MD        Allergies as of 09/28/2024 - Review Complete 09/28/2024  Allergen Reaction Noted   Johnathan Other (See Comments) 10/29/2015   Niacin and related Other (See Comments) 08/06/2016   Spironolactone   09/29/2021   Sulfa antibiotics Itching and Rash 07/27/2021   Sulfonamide derivatives Other (See Comments)     Family History  Problem Relation Age of Onset   Multiple myeloma Mother    Diabetes Father    Renal Disease Father    Coronary artery disease Other 59   Diabetes Other    Hyperlipidemia Other    Hypertension Other    Colon cancer Neg Hx    Esophageal cancer Neg Hx    Stomach cancer Neg Hx    Rectal cancer Neg Hx    Colon polyps Neg Hx     Social History   Socioeconomic History   Marital status: Married    Spouse name: Not on file   Number of children: 2   Years of education: 18   Highest education level: Master's degree (e.g., MA, MS, MEng, MEd, MSW, MBA)  Occupational History   Occupation: Sales Promotion Account Executive: [employer]    Comment: [employer]  Tobacco Use   Smoking status: Never   Smokeless tobacco: Never  Vaping Use   Vaping status: Never Used  Substance and Sexual Activity   Alcohol use: Not Currently   Drug use: No   Sexual activity: Yes    Partners: Female  Other Topics Concern   Not on file  Social History Narrative   [employer] [place], [place].   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  2 daughters-'75, '79; 1 grandchild; step-daughter  and step grandson.  Regular exercise-yes, runs 1.5-2 mi 4x/wk, also eliptical      Patient signed a Designated Party Release to allow his wife, Markees Carns, to have access to his medical records/ information.      Lives with wife/2025   Social Drivers of Health   Financial Resource Strain: Low Risk  (06/25/2024)   Overall Financial Resource Strain (CARDIA)    Difficulty of Paying Living Expenses: Not hard at all  Food Insecurity: No Food Insecurity (06/25/2024)   Hunger Vital Sign    Worried About Running Out of Food in the Last Year: Never true    Ran Out of Food in the Last Year: Never true  Transportation Needs: Unknown (06/25/2024)   PRAPARE - Administrator, Civil Service (Medical): No    Lack of Transportation (Non-Medical): Not on file  Physical Activity: Sufficiently Active (06/25/2024)   Exercise Vital Sign    Days of Exercise per Week: 5 days    Minutes of Exercise per Session: 40 min  Stress: No Stress Concern Present (06/25/2024)   [employer] of Occupational Health - Occupational Stress Questionnaire    Feeling of Stress: Only a little  Social Connections: Socially Integrated (06/25/2024)   Social Connection and Isolation Panel    Frequency of Communication with Friends and Family: Twice a week    Frequency of Social Gatherings with Friends and Family: Once a week    Attends Religious Services: 1 to 4 times per year    Active Member of [employer] or Organizations: Yes    Attends Engineer, Structural: More than 4 times per year    Marital Status: Married  Catering Manager Violence: Not At Risk (06/29/2024)   Humiliation, Afraid, Rape, and Kick questionnaire    Fear of Current or Ex-Partner: No    Emotionally Abused: No    Physically Abused: No    Sexually Abused: No    Review  of Systems: All other review of systems negative except as mentioned in the HPI.  Physical Exam: Vital signs BP (!) 117/54   Pulse (!) 48   Temp 97.7 F (36.5 C) (Temporal)   Ht 5' 11 (1.803 m)   Wt 177 lb (80.3 kg)   SpO2 95%   BMI 24.69 kg/m   General:   Alert,  Well-developed, pleasant and cooperative in NAD Lungs:  Clear throughout to auscultation.   Heart:  Regular rate and rhythm Abdomen:  Soft, nontender and nondistended.   Neuro/Psych:  Alert and cooperative. Normal mood and affect. A and O x 3  Marcey Naval, MD [facility]

## 2024-09-28 NOTE — Op Note (Addendum)
 [facility] Patient Name: Russell Griffith Procedure Date: 09/28/2024 2:10 PM MRN: [id number] Endoscopist: Elspeth P. Leigh , MD, [id number] Age: 78 Referring MD:  Date of Birth: [birth year] Gender: Male Account #: [id number] Procedure:                Colonoscopy Indications:              Iron deficiency anemia, negative EGD earlier this                            year Medicines:                Monitored Anesthesia Care Procedure:                Pre-Anesthesia Assessment:                           - Prior to the procedure, a History and Physical                            was performed, and patient medications and                            allergies were reviewed. The patient's tolerance of                            previous anesthesia was also reviewed. The risks                            and benefits of the procedure and the sedation                            options and risks were discussed with the patient.                            All questions were answered, and informed consent                            was obtained. Prior Anticoagulants: The patient has                            taken Eliquis  (apixaban ), last dose was 2 days                            prior to procedure. ASA Grade Assessment: III - A                            patient with severe systemic disease. After                            reviewing the risks and benefits, the patient was                            deemed in satisfactory condition to undergo the  procedure.                           After obtaining informed consent, the colonoscope                            was passed under direct vision. Throughout the                            procedure, the patient's blood pressure, pulse, and                            oxygen  saturations were monitored continuously. The                            Olympus Scope [id number] was introduced through the                             anus and advanced to the the terminal ileum, with                            identification of the appendiceal orifice and IC                            valve. The colonoscopy was performed without                            difficulty. The patient tolerated the procedure                            well. The quality of the bowel preparation was                            good. The terminal ileum, ileocecal valve,                            appendiceal orifice, and rectum were photographed. Scope In: 2:21:03 PM Scope Out: 2:38:38 PM Scope Withdrawal Time: 0 hours 14 minutes 25 seconds  Total Procedure Duration: 0 hours 17 minutes 35 seconds  Findings:                 The perianal and digital rectal examinations were                            normal.                           The terminal ileum appeared normal.                           A 3 mm polyp was found in the cecum. The polyp was                            flat. The polyp was removed with a cold snare.  Resection and retrieval were complete.                           A 3 mm polyp was found in the ascending colon. The                            polyp was flat. The polyp was removed with a cold                            snare. Resection and retrieval were complete.                           Two sessile polyps were found in the transverse                            colon. The polyps were 3 to 4 mm in size. These                            polyps were removed with a cold snare. Resection                            and retrieval were complete.                           Multiple diverticula were found in the sigmoid                            colon.                           Internal hemorrhoids were found during retroflexion.                           The exam was otherwise without abnormality. Complications:            No immediate complications. Estimated blood loss:                             Minimal. Estimated Blood Loss:     Estimated blood loss was minimal. Impression:               - The examined portion of the ileum was normal.                           - One 3 mm polyp in the cecum, removed with a cold                            snare. Resected and retrieved.                           - One 3 mm polyp in the ascending colon, removed                            with a cold snare. Resected and retrieved.                           -  Two 3 to 4 mm polyps in the transverse colon,                            removed with a cold snare. Resected and retrieved.                           - Diverticulosis in the sigmoid colon.                           - Internal hemorrhoids.                           - The examination was otherwise normal.                           No cause for iron deficiency on colonoscopy.                            Patient has been intolerant of initial                            supplementation of oral iron (GI upset), would                            consider alternative formulation and trend Hgb /                            iron studies. Recommendation:           - Patient has a contact number available for                            emergencies. The signs and symptoms of potential                            delayed complications were discussed with the                            patient. Return to normal activities tomorrow.                            Written discharge instructions were provided to the                            patient.                           - Resume previous diet.                           - Continue present medications.                           - Resume Eliquis  tomorow.                           - Await  pathology results. Elspeth P. Awanda Wilcock, MD 09/28/2024 2:46:15 PM This report has been signed electronically.

## 2024-09-28 NOTE — Progress Notes (Signed)
 Called to room to assist during endoscopic procedure.  Patient ID and intended procedure confirmed with present staff. Received instructions for my participation in the procedure from the performing physician.

## 2024-09-28 NOTE — Patient Instructions (Signed)
 Handouts given on diverticulosis, hemorrhoids and polyps.  YOU HAD AN ENDOSCOPIC PROCEDURE TODAY AT THE [facility]:   Refer to the procedure report that was given to you for any specific questions about what was found during the examination.  If the procedure report does not answer your questions, please call your gastroenterologist to clarify.  If you requested that your care partner not be given the details of your procedure findings, then the procedure report has been included in a sealed envelope for you to review at your convenience later.  YOU SHOULD EXPECT: Some feelings of bloating in the abdomen. Passage of more gas than usual.  Walking can help get rid of the air that was put into your GI tract during the procedure and reduce the bloating. If you had a lower endoscopy (such as a colonoscopy or flexible sigmoidoscopy) you may notice spotting of blood in your stool or on the toilet paper. If you underwent a bowel prep for your procedure, you may not have a normal bowel movement for a few days.  Please Note:  You might notice some irritation and congestion in your nose or some drainage.  This is from the oxygen  used during your procedure.  There is no need for concern and it should clear up in a day or so.  SYMPTOMS TO REPORT IMMEDIATELY:  Following lower endoscopy (colonoscopy or flexible sigmoidoscopy):  Excessive amounts of blood in the stool  Significant tenderness or worsening of abdominal pains  Swelling of the abdomen that is new, acute  Fever of 100F or higher   For urgent or emergent issues, a gastroenterologist can be reached at any hour by calling [phone]. Do not use MyChart messaging for urgent concerns.    DIET:  We do recommend a small meal at first, but then you may proceed to your regular diet.  Drink plenty of fluids but you should avoid alcoholic beverages for 24 hours.  ACTIVITY:  You should plan to take it easy for the rest of today and you  should NOT DRIVE or use heavy machinery until tomorrow (because of the sedation medicines used during the test).    FOLLOW UP: Our staff will call the number listed on your records the next business day following your procedure.  We will call around 7:15- 8:00 am to check on you and address any questions or concerns that you may have regarding the information given to you following your procedure. If we do not reach you, we will leave a message.     If any biopsies were taken you will be contacted by phone or by letter within the next 1-3 weeks.  Please call us  at [phone] if you have not heard about the biopsies in 3 weeks.    SIGNATURES/CONFIDENTIALITY: You and/or your care partner have signed paperwork which will be entered into your electronic medical record.  These signatures attest to the fact that that the information above on your After Visit Summary has been reviewed and is understood.  Full responsibility of the confidentiality of this discharge information lies with you and/or your care-partner.

## 2024-09-28 NOTE — Progress Notes (Signed)
 A/o x 3, VSS, good SR's, pleased with anesthesia, report to RN

## 2024-09-28 NOTE — Progress Notes (Signed)
 Pt's states no medical or surgical changes since previsit or office visit.

## 2024-09-29 ENCOUNTER — Telehealth: Payer: Self-pay

## 2024-09-29 NOTE — Telephone Encounter (Signed)
  Follow up Call-     09/28/2024    1:38 PM 12/15/2023    7:54 AM 11/02/2022    2:18 PM  Call back number  Post procedure Call Back phone  # [phone]  Permission to leave phone message Yes Yes Yes     Patient questions:  Do you have a fever, pain , or abdominal swelling? No. Pain Score  0 *  Have you tolerated food without any problems? Yes.    Have you been able to return to your normal activities? Yes.    Do you have any questions about your discharge instructions: Diet   No. Medications  No. Follow up visit  No.  Do you have questions or concerns about your Care? No.  Actions: * If pain score is 4 or above: No action needed, pain <4.

## 2024-10-03 LAB — SURGICAL PATHOLOGY

## 2024-10-06 ENCOUNTER — Other Ambulatory Visit: Payer: Self-pay | Admitting: *Deleted

## 2024-10-06 ENCOUNTER — Ambulatory Visit: Payer: Self-pay | Admitting: Gastroenterology

## 2024-10-06 DIAGNOSIS — D509 Iron deficiency anemia, unspecified: Secondary | ICD-10-CM

## 2024-10-25 ENCOUNTER — Encounter: Payer: Self-pay | Admitting: Internal Medicine

## 2024-10-25 ENCOUNTER — Ambulatory Visit (INDEPENDENT_AMBULATORY_CARE_PROVIDER_SITE_OTHER): Admitting: Internal Medicine

## 2024-10-25 VITALS — BP 130/70 | HR 46 | Temp 98.2°F | Ht 71.0 in | Wt 184.4 lb

## 2024-10-25 DIAGNOSIS — H9201 Otalgia, right ear: Secondary | ICD-10-CM | POA: Insufficient documentation

## 2024-10-25 DIAGNOSIS — J0100 Acute maxillary sinusitis, unspecified: Secondary | ICD-10-CM | POA: Diagnosis not present

## 2024-10-25 DIAGNOSIS — I4891 Unspecified atrial fibrillation: Secondary | ICD-10-CM

## 2024-10-25 MED ORDER — CEFDINIR 300 MG PO CAPS: 300.0000 mg | ORAL_CAPSULE | Freq: Two times a day (BID) | ORAL | 0 refills | Status: AC

## 2024-10-25 NOTE — Assessment & Plan Note (Signed)
 Nl rate Continue with Eliquis 

## 2024-10-25 NOTE — Progress Notes (Signed)
 Subjective:  Patient ID: Russell Griffith, male    DOB: 19-Apr-1946  Age: 78 y.o. MRN: [id number]  CC: Sinus Problem (Sinus issue with intermittent severe ear pain (once or twice a month, requesting ENT referral). Does have chronic nasal drip. Covid- as recently as this morning)   HPI Russell Griffith presents for sinus infection R earache/pain is worse - Chronic pain, sharp pains, muffled sounds x years off and on. A fib is OK COVID test (-) this AM  Outpatient Medications Prior to Visit  Medication Sig Dispense Refill   acetaminophen  (TYLENOL ) 500 MG tablet Take 1,000 mg by mouth every 6 (six) hours as needed for moderate pain, mild pain or headache.     atorvastatin  (LIPITOR) 20 MG tablet Take 1 tablet (20 mg total) by mouth daily. 90 tablet 1   Carboxymethylcellulose Sodium (DRY EYE RELIEF OP) Place 1 drop into both eyes daily as needed (Dry eye).     Cholecalciferol 25 MCG (1000 UT) capsule Take 1,000 Units by mouth daily with supper.      ELIQUIS  5 MG TABS tablet Take 1 tablet (5 mg total) by mouth 2 (two) times daily. 60 tablet 5   furosemide  (LASIX ) 20 MG tablet Take 1 tablet once daily as needed 90 tablet 3   isosorbide  mononitrate (IMDUR ) 60 MG 24 hr tablet Take 1 tablet (60 mg total) by mouth daily. 90 tablet 3   losartan  (COZAAR ) 25 MG tablet TAKE ONE TABLET BY MOUTH DAILY 90 tablet 3   mometasone (ELOCON) 0.1 % cream Apply 1 Application topically daily.     NONFORMULARY OR COMPOUNDED ITEM Apply 1 application topically daily as needed (rash). Cetaphil + triamcinolone  cream     pantoprazole  (PROTONIX ) 20 MG tablet Take 1 tablet (20 mg total) by mouth daily.     nitroGLYCERIN  (NITROSTAT ) 0.4 MG SL tablet Place 1 tablet (0.4 mg total) under the tongue every 5 (five) minutes as needed. (Patient not taking: Reported on 10/25/2024) 25 tablet 2   No facility-administered medications prior to visit.    ROS: Review of Systems  Constitutional:  Positive for fatigue. Negative  for appetite change and unexpected weight change.  HENT:  Positive for congestion, ear pain and sinus pressure. Negative for nosebleeds, sneezing, sore throat and trouble swallowing.   Eyes:  Negative for itching and visual disturbance.  Respiratory:  Negative for cough.   Cardiovascular:  Negative for chest pain, palpitations and leg swelling.  Gastrointestinal:  Negative for abdominal distention, blood in stool, diarrhea and nausea.  Genitourinary:  Negative for frequency and hematuria.  Musculoskeletal:  Negative for back pain, gait problem, joint swelling and neck pain.  Skin:  Negative for rash.  Neurological:  Negative for dizziness, tremors, speech difficulty and weakness.  Psychiatric/Behavioral:  Negative for agitation, dysphoric mood and sleep disturbance. The patient is not nervous/anxious.     Objective:  BP 130/70   Pulse (!) 46   Temp 98.2 F (36.8 C)   Ht 5' 11 (1.803 m)   Wt 184 lb 6.4 oz (83.6 kg)   SpO2 97%   BMI 25.72 kg/m   BP Readings from Last 3 Encounters:  10/25/24 130/70  09/28/24 (!) 114/54  08/14/24 (!) 136/54    Wt Readings from Last 3 Encounters:  10/25/24 184 lb 6.4 oz (83.6 kg)  09/28/24 177 lb (80.3 kg)  09/13/24 177 lb (80.3 kg)    Physical Exam Constitutional:      General: He is not in acute distress.  Appearance: Normal appearance. He is well-developed.     Comments: NAD  HENT:     Right Ear: Tympanic membrane normal. There is no impacted cerumen.     Left Ear: Tympanic membrane normal. There is no impacted cerumen.     Nose: Congestion and rhinorrhea present.     Mouth/Throat:     Pharynx: Oropharynx is clear. Posterior oropharyngeal erythema present. No oropharyngeal exudate.  Eyes:     Conjunctiva/sclera: Conjunctivae normal.     Pupils: Pupils are equal, round, and reactive to light.  Neck:     Thyroid : No thyromegaly.     Vascular: No JVD.  Cardiovascular:     Rate and Rhythm: Normal rate. Rhythm irregular.     Heart  sounds: Normal heart sounds. No murmur heard.    No friction rub. No gallop.  Pulmonary:     Effort: Pulmonary effort is normal. No respiratory distress.     Breath sounds: Normal breath sounds. No wheezing or rales.  Chest:     Chest wall: No tenderness.  Abdominal:     General: Bowel sounds are normal. There is no distension.     Palpations: Abdomen is soft. There is no mass.     Tenderness: There is no abdominal tenderness. There is no guarding or rebound.  Musculoskeletal:        General: No tenderness. Normal range of motion.     Cervical back: Normal range of motion.  Lymphadenopathy:     Cervical: No cervical adenopathy.  Skin:    General: Skin is warm and dry.     Findings: No rash.  Neurological:     Mental Status: He is alert and oriented to person, place, and time.     Cranial Nerves: No cranial nerve deficit.     Motor: No abnormal muscle tone.     Coordination: Coordination normal.     Gait: Gait normal.     Deep Tendon Reflexes: Reflexes are normal and symmetric.  Psychiatric:        Behavior: Behavior normal.        Thought Content: Thought content normal.        Judgment: Judgment normal.     Lab Results  Component Value Date   WBC 4.5 09/06/2024   HGB 12.2 (L) 09/06/2024   HCT 37.0 (L) 09/06/2024   PLT 144.0 (L) 09/06/2024   GLUCOSE 93 09/06/2024   CHOL 87 07/12/2024   TRIG 38.0 07/12/2024   HDL 32.70 (L) 07/12/2024   LDLCALC 47 07/12/2024   ALT 17 09/06/2024   AST 22 09/06/2024   NA 141 09/06/2024   K 4.2 09/06/2024   CL 107 09/06/2024   CREATININE 1.06 09/06/2024   BUN 20 09/06/2024   CO2 26 09/06/2024   TSH 4.20 07/12/2024   PSA 1.15 07/12/2024   INR 2.6 (H) 09/06/2024   HGBA1C 6.1 07/12/2024   MICROALBUR 14.0 (H) 07/12/2024    CUP PACEART REMOTE DEVICE CHECK Result Date: 09/08/2024 PPM Scheduled remote reviewed. Normal device function.  Presenting rhythm:  VS Next remote transmission per protocol. LA, CVRS  US  Abdomen Limited RUQ  (LIVER/GB) Result Date: 09/06/2024 CLINICAL DATA:  Cirrhosis of liver with ascites, unspecified hepatic cirrhosis type. Evaluate for ascites. EXAM: ULTRASOUND ABDOMEN LIMITED RIGHT UPPER QUADRANT COMPARISON:  06/08/2024 FINDINGS: Gallbladder: Gallbladder is not distended. 0.9 cm echogenic structure in the gallbladder could represent a sludge ball versus non shadowing stone. Reportedly, the patient does not have a sonographic Murphy sign. No significant gallbladder  wall thickening. Common bile duct: Diameter: 0.5 cm Liver: Liver is mildly heterogeneous with a slightly nodular contour. No discrete lesion. Trace perihepatic ascites. Portal vein is patent on color Doppler imaging with normal direction of blood flow towards the liver. Other: No significant ascites in the lower quadrants of the abdomen. IMPRESSION: 1. Trace perihepatic ascites. 2. Liver is mildly heterogeneous with a slightly nodular contour. Findings are compatible with cirrhosis. 3. 0.9 cm echogenic structure in the gallbladder could represent a sludge ball versus non shadowing stone. Electronically Signed   By: Juliene Balder M.D.   On: 09/06/2024 13:10    Assessment & Plan:   Problem List Items Addressed This Visit     A-fib (HCC)   Nl rate Continue with Eliquis       Acute sinusitis - Primary   Omnicef  x 10 d      Relevant Medications   cefdinir  (OMNICEF ) 300 MG capsule   Ear pain, right   Chronic pain, sharp pains, muffled sounds x years off and on ENT ref      Relevant Orders   Ambulatory referral to ENT      Meds ordered this encounter  Medications   cefdinir  (OMNICEF ) 300 MG capsule    Sig: Take 1 capsule (300 mg total) by mouth 2 (two) times daily.    Dispense:  20 capsule    Refill:  0      Follow-up: No follow-ups on file.  Marolyn Noel, MD

## 2024-10-25 NOTE — Assessment & Plan Note (Signed)
Omnicef x 10 d

## 2024-10-25 NOTE — Assessment & Plan Note (Signed)
 Chronic pain, sharp pains, muffled sounds x years off and on ENT ref

## 2024-11-01 ENCOUNTER — Encounter: Payer: Self-pay | Admitting: Cardiology

## 2024-11-01 ENCOUNTER — Ambulatory Visit: Attending: Cardiology | Admitting: Cardiology

## 2024-11-01 ENCOUNTER — Ambulatory Visit: Admitting: Cardiology

## 2024-11-01 VITALS — BP 116/62 | HR 49 | Ht 71.0 in | Wt 187.9 lb

## 2024-11-01 DIAGNOSIS — I495 Sick sinus syndrome: Secondary | ICD-10-CM | POA: Diagnosis not present

## 2024-11-01 DIAGNOSIS — I2510 Atherosclerotic heart disease of native coronary artery without angina pectoris: Secondary | ICD-10-CM

## 2024-11-01 DIAGNOSIS — I493 Ventricular premature depolarization: Secondary | ICD-10-CM

## 2024-11-01 DIAGNOSIS — I10 Essential (primary) hypertension: Secondary | ICD-10-CM

## 2024-11-01 DIAGNOSIS — I4891 Unspecified atrial fibrillation: Secondary | ICD-10-CM

## 2024-11-01 DIAGNOSIS — Z951 Presence of aortocoronary bypass graft: Secondary | ICD-10-CM

## 2024-11-01 LAB — CUP PACEART INCLINIC DEVICE CHECK
Date Time Interrogation Session: 20251203154623
Implantable Lead Connection Status: 753985
Implantable Lead Connection Status: 753985
Implantable Lead Implant Date: 20130816
Implantable Lead Implant Date: 20130816
Implantable Lead Location: 753859
Implantable Lead Location: 753860
Implantable Lead Model: 5076
Implantable Lead Model: 5076
Implantable Pulse Generator Implant Date: 20231011

## 2024-11-01 NOTE — Progress Notes (Signed)
 Electrophysiology Office Note:   Date:  11/01/2024  ID:  Russell Griffith, DOB 09-23-1946, MRN [id number]  Primary Cardiologist: Lynwood Schilling, MD Primary Heart Failure: None Electrophysiologist: Elspeth Sage, MD (Inactive)      History of Present Illness:   Russell Griffith is a 78 y.o. male with h/o aortic stenosis, coronary disease post CABG, carotid artery disease, cirrhosis, hypertension, hyperlipidemia, atrial fibrillation, PVCs seen today for routine electrophysiology followup.   Since last being seen in our clinic the patient reports doing his main complaint today is fatigue.  He is able to do his daily activities, though has to do them more slowly.  He has been worked up by GI for iron deficiency anemia.  He has an upcoming iron infusion.  He is interested in potentially pursuing Watchman.  He was initially seen for watchman evaluation, but declined as he was told that he would need lifelong Plavix .  he denies chest pain, palpitations, dyspnea, PND, orthopnea, nausea, vomiting, dizziness, syncope, edema, weight gain, or early satiety.   Review of systems complete and found to be negative unless listed in HPI.      EP Information / Studies Reviewed:    EKG is ordered today. Personal review as below.  EKG Interpretation Date/Time:  Wednesday November 01 2024 15:30:16 EST Ventricular Rate:  49 PR Interval:    QRS Duration:  96 QT Interval:  494 QTC Calculation: 446 R Axis:   112  Text Interpretation: Atrial fibrillation with slow ventricular response Right axis deviation When compared with ECG of 01-Oct-2023 13:43, No significant change since last tracing Confirmed by Janeah Kovacich ([id number]) on 11/01/2024 3:37:04 PM   PPM Interrogation-  reviewed in detail today,  See PACEART report.  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 The patient's score is based upon: CHF History: 0 HTN History: 1 Diabetes History: 0 Stroke History: 0 Vascular Disease History: 1 Age Score: 2 Gender Score: 0            Physical Exam:   VS:  BP 116/62 (BP Location: Right Arm, Patient Position: Sitting, Cuff Size: Normal)   Pulse (!) 49   Ht 5' 11 (1.803 m)   Wt 187 lb 14.4 oz (85.2 kg)   SpO2 97%   BMI 26.21 kg/m    Wt Readings from Last 3 Encounters:  11/01/24 187 lb 14.4 oz (85.2 kg)  10/25/24 184 lb 6.4 oz (83.6 kg)  09/28/24 177 lb (80.3 kg)     GEN: Well nourished, well developed in no acute distress NECK: No JVD; No carotid bruits CARDIAC: Irregularly irregular rate and rhythm, no murmurs, rubs, gallops RESPIRATORY:  Clear to auscultation without rales, wheezing or rhonchi  ABDOMEN: Soft, non-tender, non-distended EXTREMITIES:  No edema; No deformity   ASSESSMENT AND PLAN:    SND s/p Medtronic PPM  Normal PPM function See Pace Art report No changes today  2.  Permanent atrial fibrillation: He has some fatigue.  This may be related to his atrial fibrillation, but he does have iron deficiency anemia.  He has had issues with GI bleeding.  We discussed potential watchman implant and long-term anticoagulation/antiplatelets.  3.  Secondary hypercoagulable state: On Eliquis   4.  Coronary disease: Post CABG.  Plan per primary cardiology.  Disposition:   Follow up with EP Team in 12 months  Signed, Elayna Tobler Gladis Norton, MD

## 2024-11-07 ENCOUNTER — Ambulatory Visit

## 2024-11-07 VITALS — BP 122/66 | HR 40 | Temp 97.4°F | Resp 18 | Ht 71.0 in | Wt 186.0 lb

## 2024-11-07 DIAGNOSIS — D509 Iron deficiency anemia, unspecified: Secondary | ICD-10-CM | POA: Diagnosis not present

## 2024-11-07 MED ORDER — SODIUM CHLORIDE 0.9 % IV SOLN
510.0000 mg | Freq: Once | INTRAVENOUS | Status: AC
  Administered 2024-11-07: 510 mg via INTRAVENOUS
  Filled 2024-11-07: qty 17

## 2024-11-07 NOTE — Patient Instructions (Signed)

## 2024-11-07 NOTE — Progress Notes (Signed)
 Diagnosis: Iron Deficiency Anemia  Provider:  Praveen Mannam MD  Procedure: IV Infusion  IV Type: Peripheral, IV Location: L Hand  Feraheme  (Ferumoxytol ), Dose: 510 mg  Infusion Start Time: 1508  Infusion Stop Time: 1524  Post Infusion IV Care: Observation period completed and Peripheral IV Discontinued  Discharge: Condition: Good, Destination: Home . AVS Provided  Performed by:  Cheila Wickstrom, RN

## 2024-11-12 ENCOUNTER — Other Ambulatory Visit: Payer: Self-pay | Admitting: Cardiology

## 2024-11-12 DIAGNOSIS — I4821 Permanent atrial fibrillation: Secondary | ICD-10-CM

## 2024-11-13 NOTE — Telephone Encounter (Signed)
 Prescription refill request for Eliquis  received. Indication:afib Last office visit:12/25 Scr: 1.06  10/25 Age:78 Weight:84.4  kg  Prescription refilled

## 2024-11-14 ENCOUNTER — Ambulatory Visit

## 2024-11-14 VITALS — BP 120/68 | HR 46 | Temp 98.0°F | Resp 18 | Ht 71.0 in | Wt 189.0 lb

## 2024-11-14 DIAGNOSIS — D509 Iron deficiency anemia, unspecified: Secondary | ICD-10-CM

## 2024-11-14 MED ORDER — SODIUM CHLORIDE 0.9 % IV SOLN
510.0000 mg | Freq: Once | INTRAVENOUS | Status: AC
  Administered 2024-11-14: 11:00:00 510 mg via INTRAVENOUS
  Filled 2024-11-14: qty 17

## 2024-11-14 NOTE — Progress Notes (Signed)
 Diagnosis: Iron Deficiency Anemia  Provider:  Praveen Mannam MD  Procedure: IV Infusion  IV Type: Peripheral, IV Location: L Hand  Feraheme  (Ferumoxytol ), Dose: 510 mg  Infusion Start Time: 1045  Infusion Stop Time: 1104  Post Infusion IV Care: Observation period completed and Peripheral IV Discontinued  Discharge: Condition: Good, Destination: Home . AVS Declined  Performed by:  Arnola Crittendon, RN

## 2024-11-26 ENCOUNTER — Other Ambulatory Visit: Payer: Self-pay | Admitting: Internal Medicine

## 2024-11-26 DIAGNOSIS — E785 Hyperlipidemia, unspecified: Secondary | ICD-10-CM

## 2024-12-04 DIAGNOSIS — K7460 Unspecified cirrhosis of liver: Secondary | ICD-10-CM

## 2024-12-04 DIAGNOSIS — D509 Iron deficiency anemia, unspecified: Secondary | ICD-10-CM

## 2024-12-04 NOTE — Telephone Encounter (Signed)
 Lab orders entered . (Patient had Ultrasound in October so should not be due again now).  Message to patient to go to the lab  this week.

## 2024-12-04 NOTE — Telephone Encounter (Signed)
-----   Message from [place] [facility] [place] [facility] sent at 08/14/2024  3:43 PM EDT ----- Regarding: due for labs and RUQ Patient will be due for labs (CBC, CMET and AFP) and RUQ in mid January

## 2024-12-05 ENCOUNTER — Other Ambulatory Visit (INDEPENDENT_AMBULATORY_CARE_PROVIDER_SITE_OTHER)

## 2024-12-05 DIAGNOSIS — K7460 Unspecified cirrhosis of liver: Secondary | ICD-10-CM

## 2024-12-05 DIAGNOSIS — R188 Other ascites: Secondary | ICD-10-CM

## 2024-12-05 DIAGNOSIS — D509 Iron deficiency anemia, unspecified: Secondary | ICD-10-CM | POA: Diagnosis not present

## 2024-12-05 LAB — IBC + FERRITIN
Ferritin: 190.3 ng/mL (ref 22.0–322.0)
Iron: 84 ug/dL (ref 42–165)
Saturation Ratios: 22.8 % (ref 20.0–50.0)
TIBC: 368.2 ug/dL (ref 250.0–450.0)
Transferrin: 263 mg/dL (ref 212.0–360.0)

## 2024-12-05 LAB — CBC WITH DIFFERENTIAL/PLATELET
Basophils Absolute: 0 K/uL (ref 0.0–0.1)
Basophils Relative: 0.7 % (ref 0.0–3.0)
Eosinophils Absolute: 0.1 K/uL (ref 0.0–0.7)
Eosinophils Relative: 2.5 % (ref 0.0–5.0)
HCT: 36.5 % — ABNORMAL LOW (ref 39.0–52.0)
Hemoglobin: 12.4 g/dL — ABNORMAL LOW (ref 13.0–17.0)
Lymphocytes Relative: 24 % (ref 12.0–46.0)
Lymphs Abs: 1.1 K/uL (ref 0.7–4.0)
MCHC: 34 g/dL (ref 30.0–36.0)
MCV: 85.9 fl (ref 78.0–100.0)
Monocytes Absolute: 0.8 K/uL (ref 0.1–1.0)
Monocytes Relative: 17.9 % — ABNORMAL HIGH (ref 3.0–12.0)
Neutro Abs: 2.6 K/uL (ref 1.4–7.7)
Neutrophils Relative %: 54.9 % (ref 43.0–77.0)
Platelets: 137 K/uL — ABNORMAL LOW (ref 150.0–400.0)
RBC: 4.25 Mil/uL (ref 4.22–5.81)
RDW: 18.2 % — ABNORMAL HIGH (ref 11.5–15.5)
WBC: 4.7 K/uL (ref 4.0–10.5)

## 2024-12-05 LAB — COMPREHENSIVE METABOLIC PANEL WITH GFR
ALT: 16 U/L (ref 3–53)
AST: 23 U/L (ref 5–37)
Albumin: 4.2 g/dL (ref 3.5–5.2)
Alkaline Phosphatase: 102 U/L (ref 39–117)
BUN: 22 mg/dL (ref 6–23)
CO2: 28 meq/L (ref 19–32)
Calcium: 9.3 mg/dL (ref 8.4–10.5)
Chloride: 106 meq/L (ref 96–112)
Creatinine, Ser: 1.05 mg/dL (ref 0.40–1.50)
GFR: 68.76 mL/min
Glucose, Bld: 83 mg/dL (ref 70–99)
Potassium: 4.2 meq/L (ref 3.5–5.1)
Sodium: 141 meq/L (ref 135–145)
Total Bilirubin: 3.3 mg/dL — ABNORMAL HIGH (ref 0.2–1.2)
Total Protein: 6.6 g/dL (ref 6.0–8.3)

## 2024-12-07 ENCOUNTER — Ambulatory Visit

## 2024-12-07 ENCOUNTER — Ambulatory Visit: Payer: Self-pay | Admitting: Gastroenterology

## 2024-12-07 DIAGNOSIS — I4821 Permanent atrial fibrillation: Secondary | ICD-10-CM

## 2024-12-07 LAB — AFP TUMOR MARKER: AFP-Tumor Marker: 2.7 ng/mL

## 2024-12-08 ENCOUNTER — Ambulatory Visit: Payer: Self-pay | Admitting: Cardiology

## 2024-12-08 LAB — CUP PACEART REMOTE DEVICE CHECK
Battery Remaining Longevity: 157 mo
Battery Voltage: 3.05 V
Brady Statistic AP VP Percent: 0 %
Brady Statistic AP VS Percent: 0 %
Brady Statistic AS VP Percent: 2.18 %
Brady Statistic AS VS Percent: 97.82 %
Brady Statistic RA Percent Paced: 0 %
Brady Statistic RV Percent Paced: 2.18 %
Date Time Interrogation Session: 20260107222600
Implantable Lead Connection Status: 753985
Implantable Lead Connection Status: 753985
Implantable Lead Implant Date: 20130816
Implantable Lead Implant Date: 20130816
Implantable Lead Location: 753859
Implantable Lead Location: 753860
Implantable Lead Model: 5076
Implantable Lead Model: 5076
Implantable Pulse Generator Implant Date: 20231011
Lead Channel Impedance Value: 304 Ohm
Lead Channel Impedance Value: 437 Ohm
Lead Channel Impedance Value: 741 Ohm
Lead Channel Impedance Value: 950 Ohm
Lead Channel Pacing Threshold Amplitude: 2.5 V
Lead Channel Pacing Threshold Pulse Width: 0.4 ms
Lead Channel Sensing Intrinsic Amplitude: 0.375 mV
Lead Channel Sensing Intrinsic Amplitude: 13.625 mV
Lead Channel Sensing Intrinsic Amplitude: 13.625 mV
Lead Channel Setting Pacing Amplitude: 3 V
Lead Channel Setting Pacing Pulse Width: 1 ms
Lead Channel Setting Sensing Sensitivity: 0.9 mV
Zone Setting Status: 755011

## 2024-12-12 NOTE — Progress Notes (Signed)
 Remote PPM Transmission

## 2025-03-08 ENCOUNTER — Encounter

## 2025-04-25 ENCOUNTER — Ambulatory Visit: Admitting: Cardiology

## 2025-06-07 ENCOUNTER — Encounter

## 2025-07-16 ENCOUNTER — Ambulatory Visit

## 2025-07-16 ENCOUNTER — Encounter: Admitting: Internal Medicine

## 2025-09-06 ENCOUNTER — Encounter
